# Patient Record
Sex: Female | Born: 1950
Health system: Southern US, Community
[De-identification: ages and names within clinical notes are randomized; demographics above are authoritative.]

## PROBLEM LIST (undated history)

## (undated) DIAGNOSIS — Z8719 Personal history of other diseases of the digestive system: Secondary | ICD-10-CM

## (undated) DIAGNOSIS — J189 Pneumonia, unspecified organism: Secondary | ICD-10-CM

## (undated) DIAGNOSIS — Q211 Atrial septal defect: Secondary | ICD-10-CM

## (undated) DIAGNOSIS — E119 Type 2 diabetes mellitus without complications: Secondary | ICD-10-CM

## (undated) DIAGNOSIS — C01 Malignant neoplasm of base of tongue: Secondary | ICD-10-CM

## (undated) DIAGNOSIS — Z923 Personal history of irradiation: Secondary | ICD-10-CM

## (undated) DIAGNOSIS — F32A Depression, unspecified: Secondary | ICD-10-CM

## (undated) DIAGNOSIS — I2510 Atherosclerotic heart disease of native coronary artery without angina pectoris: Secondary | ICD-10-CM

## (undated) DIAGNOSIS — J439 Emphysema, unspecified: Secondary | ICD-10-CM

## (undated) DIAGNOSIS — I219 Acute myocardial infarction, unspecified: Secondary | ICD-10-CM

## (undated) DIAGNOSIS — K219 Gastro-esophageal reflux disease without esophagitis: Secondary | ICD-10-CM

## (undated) DIAGNOSIS — Z86718 Personal history of other venous thrombosis and embolism: Secondary | ICD-10-CM

## (undated) DIAGNOSIS — M1990 Unspecified osteoarthritis, unspecified site: Secondary | ICD-10-CM

## (undated) DIAGNOSIS — Z9861 Coronary angioplasty status: Secondary | ICD-10-CM

## (undated) DIAGNOSIS — J42 Unspecified chronic bronchitis: Secondary | ICD-10-CM

## (undated) DIAGNOSIS — E7800 Pure hypercholesterolemia, unspecified: Secondary | ICD-10-CM

## (undated) DIAGNOSIS — I639 Cerebral infarction, unspecified: Secondary | ICD-10-CM

## (undated) DIAGNOSIS — Z9289 Personal history of other medical treatment: Secondary | ICD-10-CM

## (undated) DIAGNOSIS — G459 Transient cerebral ischemic attack, unspecified: Secondary | ICD-10-CM

## (undated) DIAGNOSIS — F329 Major depressive disorder, single episode, unspecified: Secondary | ICD-10-CM

## (undated) DIAGNOSIS — I739 Peripheral vascular disease, unspecified: Secondary | ICD-10-CM

## (undated) DIAGNOSIS — I6529 Occlusion and stenosis of unspecified carotid artery: Secondary | ICD-10-CM

## (undated) HISTORY — DX: Malignant neoplasm of base of tongue: C01

## (undated) HISTORY — PX: CORONARY ANGIOPLASTY WITH STENT PLACEMENT: SHX49

## (undated) HISTORY — DX: Atrial septal defect: Q21.1

## (undated) HISTORY — DX: Peripheral vascular disease, unspecified: I73.9

## (undated) HISTORY — DX: Personal history of other venous thrombosis and embolism: Z86.718

## (undated) HISTORY — PX: CARDIAC CATHETERIZATION: SHX172

## (undated) HISTORY — DX: Occlusion and stenosis of unspecified carotid artery: I65.29

## (undated) HISTORY — PX: FEMORAL BYPASS: SHX50

## (undated) HISTORY — DX: Atherosclerotic heart disease of native coronary artery without angina pectoris: I25.10

## (undated) HISTORY — DX: Emphysema, unspecified: J43.9

## (undated) HISTORY — DX: Acute myocardial infarction, unspecified: I21.9

## (undated) HISTORY — DX: Transient cerebral ischemic attack, unspecified: G45.9

## (undated) HISTORY — PX: CARPAL TUNNEL RELEASE: SHX101

## (undated) HISTORY — DX: Cerebral infarction, unspecified: I63.9

## (undated) HISTORY — DX: Atherosclerotic heart disease of native coronary artery without angina pectoris: Z98.61

## (undated) HISTORY — DX: Personal history of irradiation: Z92.3

## (undated) HISTORY — PX: EYE SURGERY: SHX253

---
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
## 1986-06-03 HISTORY — PX: VAGINAL HYSTERECTOMY: SUR661

## 1986-06-03 HISTORY — PX: TUBAL LIGATION: SHX77

## 1998-02-01 ENCOUNTER — Inpatient Hospital Stay (HOSPITAL_COMMUNITY): Admission: AD | Admit: 1998-02-01 | Discharge: 1998-02-03 | Payer: Self-pay | Admitting: Cardiology

## 1998-02-01 DIAGNOSIS — I25119 Atherosclerotic heart disease of native coronary artery with unspecified angina pectoris: Secondary | ICD-10-CM | POA: Insufficient documentation

## 1998-05-16 ENCOUNTER — Ambulatory Visit: Admission: RE | Admit: 1998-05-16 | Discharge: 1998-05-16 | Payer: Self-pay | Admitting: Cardiology

## 1998-06-20 ENCOUNTER — Inpatient Hospital Stay: Admission: RE | Admit: 1998-06-20 | Discharge: 1998-06-23 | Payer: Self-pay | Admitting: Vascular Surgery

## 1998-08-04 ENCOUNTER — Ambulatory Visit (HOSPITAL_BASED_OUTPATIENT_CLINIC_OR_DEPARTMENT_OTHER): Admission: RE | Admit: 1998-08-04 | Discharge: 1998-08-04 | Payer: Self-pay | Admitting: *Deleted

## 1998-08-20 ENCOUNTER — Inpatient Hospital Stay (HOSPITAL_COMMUNITY): Admission: EM | Admit: 1998-08-20 | Discharge: 1998-08-29 | Payer: Self-pay

## 1998-08-20 ENCOUNTER — Encounter: Payer: Self-pay | Admitting: Emergency Medicine

## 1998-08-21 ENCOUNTER — Encounter (HOSPITAL_BASED_OUTPATIENT_CLINIC_OR_DEPARTMENT_OTHER): Payer: Self-pay | Admitting: Internal Medicine

## 1998-08-24 ENCOUNTER — Encounter (HOSPITAL_BASED_OUTPATIENT_CLINIC_OR_DEPARTMENT_OTHER): Payer: Self-pay | Admitting: Internal Medicine

## 1998-09-04 ENCOUNTER — Encounter: Admission: RE | Admit: 1998-09-04 | Discharge: 1998-12-03 | Payer: Self-pay | Admitting: Internal Medicine

## 1998-09-20 ENCOUNTER — Encounter: Payer: Self-pay | Admitting: Emergency Medicine

## 1998-09-20 ENCOUNTER — Emergency Department (HOSPITAL_COMMUNITY): Admission: EM | Admit: 1998-09-20 | Discharge: 1998-09-20 | Payer: Self-pay | Admitting: Emergency Medicine

## 1998-09-20 ENCOUNTER — Ambulatory Visit (HOSPITAL_COMMUNITY): Admission: RE | Admit: 1998-09-20 | Discharge: 1998-09-20 | Payer: Self-pay | Admitting: Endocrinology

## 1998-12-29 ENCOUNTER — Encounter: Payer: Self-pay | Admitting: Endocrinology

## 1998-12-29 ENCOUNTER — Ambulatory Visit (HOSPITAL_COMMUNITY): Admission: RE | Admit: 1998-12-29 | Discharge: 1998-12-29 | Payer: Self-pay | Admitting: Endocrinology

## 1999-01-18 ENCOUNTER — Encounter: Payer: Self-pay | Admitting: *Deleted

## 1999-01-18 ENCOUNTER — Emergency Department (HOSPITAL_COMMUNITY): Admission: EM | Admit: 1999-01-18 | Discharge: 1999-01-18 | Payer: Self-pay | Admitting: Emergency Medicine

## 1999-08-22 ENCOUNTER — Encounter (INDEPENDENT_AMBULATORY_CARE_PROVIDER_SITE_OTHER): Payer: Self-pay | Admitting: Specialist

## 1999-08-22 ENCOUNTER — Ambulatory Visit (HOSPITAL_COMMUNITY): Admission: RE | Admit: 1999-08-22 | Discharge: 1999-08-22 | Payer: Self-pay | Admitting: *Deleted

## 1999-11-19 ENCOUNTER — Inpatient Hospital Stay (HOSPITAL_COMMUNITY): Admission: EM | Admit: 1999-11-19 | Discharge: 1999-11-21 | Payer: Self-pay | Admitting: Emergency Medicine

## 1999-12-20 ENCOUNTER — Encounter: Payer: Self-pay | Admitting: Obstetrics and Gynecology

## 1999-12-20 ENCOUNTER — Encounter: Admission: RE | Admit: 1999-12-20 | Discharge: 1999-12-20 | Payer: Self-pay | Admitting: Obstetrics and Gynecology

## 2000-05-11 ENCOUNTER — Emergency Department (HOSPITAL_COMMUNITY): Admission: EM | Admit: 2000-05-11 | Discharge: 2000-05-12 | Payer: Self-pay | Admitting: Emergency Medicine

## 2000-05-12 ENCOUNTER — Encounter: Payer: Self-pay | Admitting: Emergency Medicine

## 2000-07-03 ENCOUNTER — Encounter: Payer: Self-pay | Admitting: Emergency Medicine

## 2000-07-03 ENCOUNTER — Inpatient Hospital Stay (HOSPITAL_COMMUNITY): Admission: EM | Admit: 2000-07-03 | Discharge: 2000-07-04 | Payer: Self-pay | Admitting: Emergency Medicine

## 2001-07-22 ENCOUNTER — Encounter (INDEPENDENT_AMBULATORY_CARE_PROVIDER_SITE_OTHER): Payer: Self-pay | Admitting: Specialist

## 2001-07-22 ENCOUNTER — Ambulatory Visit (HOSPITAL_COMMUNITY): Admission: RE | Admit: 2001-07-22 | Discharge: 2001-07-22 | Payer: Self-pay | Admitting: *Deleted

## 2001-10-20 ENCOUNTER — Inpatient Hospital Stay (HOSPITAL_COMMUNITY): Admission: EM | Admit: 2001-10-20 | Discharge: 2001-10-23 | Payer: Self-pay | Admitting: Emergency Medicine

## 2001-10-20 ENCOUNTER — Encounter: Payer: Self-pay | Admitting: Emergency Medicine

## 2001-10-28 ENCOUNTER — Inpatient Hospital Stay (HOSPITAL_COMMUNITY): Admission: EM | Admit: 2001-10-28 | Discharge: 2001-10-29 | Payer: Self-pay | Admitting: *Deleted

## 2001-10-28 ENCOUNTER — Encounter: Payer: Self-pay | Admitting: *Deleted

## 2004-03-22 ENCOUNTER — Ambulatory Visit (HOSPITAL_COMMUNITY): Admission: RE | Admit: 2004-03-22 | Discharge: 2004-03-22 | Payer: Self-pay | Admitting: *Deleted

## 2004-03-22 ENCOUNTER — Encounter (INDEPENDENT_AMBULATORY_CARE_PROVIDER_SITE_OTHER): Payer: Self-pay | Admitting: Specialist

## 2005-04-29 ENCOUNTER — Encounter: Admission: RE | Admit: 2005-04-29 | Discharge: 2005-07-28 | Payer: Self-pay | Admitting: Orthopedic Surgery

## 2005-06-03 HISTORY — PX: ANKLE FRACTURE SURGERY: SHX122

## 2005-10-19 ENCOUNTER — Emergency Department (HOSPITAL_COMMUNITY): Admission: EM | Admit: 2005-10-19 | Discharge: 2005-10-19 | Payer: Self-pay | Admitting: *Deleted

## 2005-10-21 ENCOUNTER — Inpatient Hospital Stay (HOSPITAL_COMMUNITY): Admission: AD | Admit: 2005-10-21 | Discharge: 2005-10-24 | Payer: Self-pay | Admitting: Orthopedic Surgery

## 2007-03-16 ENCOUNTER — Emergency Department (HOSPITAL_COMMUNITY): Admission: EM | Admit: 2007-03-16 | Discharge: 2007-03-16 | Payer: Self-pay | Admitting: Emergency Medicine

## 2007-04-20 ENCOUNTER — Encounter (INDEPENDENT_AMBULATORY_CARE_PROVIDER_SITE_OTHER): Payer: Self-pay | Admitting: *Deleted

## 2007-04-20 ENCOUNTER — Ambulatory Visit (HOSPITAL_COMMUNITY): Admission: RE | Admit: 2007-04-20 | Discharge: 2007-04-20 | Payer: Self-pay | Admitting: *Deleted

## 2007-06-19 ENCOUNTER — Ambulatory Visit: Payer: Self-pay | Admitting: Vascular Surgery

## 2007-08-18 ENCOUNTER — Encounter: Admission: RE | Admit: 2007-08-18 | Discharge: 2007-08-18 | Payer: Self-pay | Admitting: Orthopedic Surgery

## 2008-06-03 DIAGNOSIS — C01 Malignant neoplasm of base of tongue: Secondary | ICD-10-CM

## 2008-06-03 HISTORY — DX: Malignant neoplasm of base of tongue: C01

## 2008-11-01 ENCOUNTER — Encounter: Admission: RE | Admit: 2008-11-01 | Discharge: 2008-11-01 | Payer: Self-pay | Admitting: Otolaryngology

## 2008-11-04 ENCOUNTER — Other Ambulatory Visit: Admission: RE | Admit: 2008-11-04 | Discharge: 2008-11-04 | Payer: Self-pay | Admitting: Otolaryngology

## 2008-11-10 ENCOUNTER — Other Ambulatory Visit: Admission: RE | Admit: 2008-11-10 | Discharge: 2008-11-10 | Payer: Self-pay | Admitting: Otolaryngology

## 2008-12-07 ENCOUNTER — Ambulatory Visit (HOSPITAL_COMMUNITY): Admission: RE | Admit: 2008-12-07 | Discharge: 2008-12-07 | Payer: Self-pay | Admitting: Otolaryngology

## 2008-12-07 ENCOUNTER — Encounter (INDEPENDENT_AMBULATORY_CARE_PROVIDER_SITE_OTHER): Payer: Self-pay | Admitting: Otolaryngology

## 2008-12-16 ENCOUNTER — Encounter: Admission: RE | Admit: 2008-12-16 | Discharge: 2008-12-16 | Payer: Self-pay | Admitting: Family Medicine

## 2008-12-19 ENCOUNTER — Ambulatory Visit (HOSPITAL_COMMUNITY): Admission: RE | Admit: 2008-12-19 | Discharge: 2008-12-19 | Payer: Self-pay | Admitting: Otolaryngology

## 2008-12-21 ENCOUNTER — Ambulatory Visit: Admission: RE | Admit: 2008-12-21 | Discharge: 2009-03-10 | Payer: Self-pay | Admitting: Radiation Oncology

## 2008-12-21 ENCOUNTER — Ambulatory Visit: Payer: Self-pay | Admitting: Oncology

## 2008-12-22 LAB — CBC WITH DIFFERENTIAL/PLATELET
Eosinophils Absolute: 0.1 10*3/uL (ref 0.0–0.5)
HCT: 41.3 % (ref 34.8–46.6)
LYMPH%: 23 % (ref 14.0–49.7)
MCV: 90 fL (ref 79.5–101.0)
MONO#: 0.4 10*3/uL (ref 0.1–0.9)
MONO%: 6 % (ref 0.0–14.0)
NEUT#: 4.7 10*3/uL (ref 1.5–6.5)
NEUT%: 68.9 % (ref 38.4–76.8)
Platelets: 195 10*3/uL (ref 145–400)
WBC: 6.8 10*3/uL (ref 3.9–10.3)

## 2008-12-22 LAB — COMPREHENSIVE METABOLIC PANEL
BUN: 11 mg/dL (ref 6–23)
CO2: 28 mEq/L (ref 19–32)
Creatinine, Ser: 0.84 mg/dL (ref 0.40–1.20)
Glucose, Bld: 172 mg/dL — ABNORMAL HIGH (ref 70–99)
Total Bilirubin: 0.5 mg/dL (ref 0.3–1.2)

## 2009-01-02 ENCOUNTER — Ambulatory Visit: Payer: Self-pay | Admitting: Dentistry

## 2009-01-02 ENCOUNTER — Encounter: Admission: RE | Admit: 2009-01-02 | Discharge: 2009-01-02 | Payer: Self-pay | Admitting: Dentistry

## 2009-01-04 ENCOUNTER — Ambulatory Visit (HOSPITAL_COMMUNITY): Admission: RE | Admit: 2009-01-04 | Discharge: 2009-01-04 | Payer: Self-pay | Admitting: Radiation Oncology

## 2009-01-16 LAB — CBC WITH DIFFERENTIAL/PLATELET
BASO%: 0.2 % (ref 0.0–2.0)
Basophils Absolute: 0 10*3/uL (ref 0.0–0.1)
EOS%: 5.3 % (ref 0.0–7.0)
HCT: 43.6 % (ref 34.8–46.6)
MCH: 30 pg (ref 25.1–34.0)
MCHC: 33.9 g/dL (ref 31.5–36.0)
MCV: 88.3 fL (ref 79.5–101.0)
MONO%: 6.3 % (ref 0.0–14.0)
NEUT%: 71.9 % (ref 38.4–76.8)
RDW: 14.3 % (ref 11.2–14.5)
lymph#: 1.5 10*3/uL (ref 0.9–3.3)

## 2009-01-16 LAB — COMPREHENSIVE METABOLIC PANEL
ALT: 29 U/L (ref 0–35)
AST: 17 U/L (ref 0–37)
CO2: 25 mEq/L (ref 19–32)
Calcium: 9.7 mg/dL (ref 8.4–10.5)
Chloride: 98 mEq/L (ref 96–112)
Sodium: 136 mEq/L (ref 135–145)
Total Protein: 6.5 g/dL (ref 6.0–8.3)

## 2009-01-16 LAB — MAGNESIUM: Magnesium: 1.8 mg/dL (ref 1.5–2.5)

## 2009-01-19 ENCOUNTER — Ambulatory Visit: Payer: Self-pay | Admitting: Oncology

## 2009-01-23 ENCOUNTER — Ambulatory Visit: Payer: Self-pay | Admitting: Oncology

## 2009-01-23 ENCOUNTER — Other Ambulatory Visit: Payer: Self-pay | Admitting: Oncology

## 2009-01-23 ENCOUNTER — Inpatient Hospital Stay (HOSPITAL_COMMUNITY): Admission: AD | Admit: 2009-01-23 | Discharge: 2009-01-27 | Payer: Self-pay | Admitting: Oncology

## 2009-01-23 LAB — CBC WITH DIFFERENTIAL/PLATELET
BASO%: 0.5 % (ref 0.0–2.0)
Basophils Absolute: 0 10*3/uL (ref 0.0–0.1)
EOS%: 12.7 % — ABNORMAL HIGH (ref 0.0–7.0)
HGB: 14.9 g/dL (ref 11.6–15.9)
MCH: 29.8 pg (ref 25.1–34.0)
MCV: 87.2 fL (ref 79.5–101.0)
MONO%: 5.8 % (ref 0.0–14.0)
RBC: 5 10*6/uL (ref 3.70–5.45)
RDW: 14.3 % (ref 11.2–14.5)
lymph#: 1.2 10*3/uL (ref 0.9–3.3)

## 2009-01-23 LAB — COMPREHENSIVE METABOLIC PANEL
ALT: 22 U/L (ref 0–35)
AST: 15 U/L (ref 0–37)
Albumin: 3.9 g/dL (ref 3.5–5.2)
Alkaline Phosphatase: 170 U/L — ABNORMAL HIGH (ref 39–117)
Calcium: 9.6 mg/dL (ref 8.4–10.5)
Chloride: 96 mEq/L (ref 96–112)
Potassium: 4.6 mEq/L (ref 3.5–5.3)
Sodium: 133 mEq/L — ABNORMAL LOW (ref 135–145)
Total Protein: 6.3 g/dL (ref 6.0–8.3)

## 2009-01-30 LAB — CBC WITH DIFFERENTIAL/PLATELET
Basophils Absolute: 0 10*3/uL (ref 0.0–0.1)
Eosinophils Absolute: 1.2 10*3/uL — ABNORMAL HIGH (ref 0.0–0.5)
HGB: 14.2 g/dL (ref 11.6–15.9)
LYMPH%: 11.4 % — ABNORMAL LOW (ref 14.0–49.7)
MONO#: 0.6 10*3/uL (ref 0.1–0.9)
NEUT#: 4.6 10*3/uL (ref 1.5–6.5)
Platelets: 161 10*3/uL (ref 145–400)
RBC: 4.74 10*6/uL (ref 3.70–5.45)
RDW: 14.5 % (ref 11.2–14.5)
WBC: 7.2 10*3/uL (ref 3.9–10.3)

## 2009-01-30 LAB — COMPREHENSIVE METABOLIC PANEL
AST: 16 U/L (ref 0–37)
Albumin: 4.3 g/dL (ref 3.5–5.2)
Alkaline Phosphatase: 165 U/L — ABNORMAL HIGH (ref 39–117)
BUN: 17 mg/dL (ref 6–23)
Creatinine, Ser: 0.71 mg/dL (ref 0.40–1.20)
Glucose, Bld: 243 mg/dL — ABNORMAL HIGH (ref 70–99)
Potassium: 4.6 mEq/L (ref 3.5–5.3)

## 2009-02-07 LAB — COMPREHENSIVE METABOLIC PANEL
Albumin: 3.9 g/dL (ref 3.5–5.2)
CO2: 23 mEq/L (ref 19–32)
Calcium: 9.5 mg/dL (ref 8.4–10.5)
Chloride: 96 mEq/L (ref 96–112)
Glucose, Bld: 350 mg/dL — ABNORMAL HIGH (ref 70–99)
Sodium: 133 mEq/L — ABNORMAL LOW (ref 135–145)
Total Bilirubin: 0.5 mg/dL (ref 0.3–1.2)
Total Protein: 6.7 g/dL (ref 6.0–8.3)

## 2009-02-07 LAB — CBC WITH DIFFERENTIAL/PLATELET
Eosinophils Absolute: 0.3 10*3/uL (ref 0.0–0.5)
HCT: 42.6 % (ref 34.8–46.6)
LYMPH%: 10.8 % — ABNORMAL LOW (ref 14.0–49.7)
MONO#: 0.5 10*3/uL (ref 0.1–0.9)
NEUT#: 4.2 10*3/uL (ref 1.5–6.5)
Platelets: 152 10*3/uL (ref 145–400)
RBC: 4.88 10*6/uL (ref 3.70–5.45)
WBC: 5.7 10*3/uL (ref 3.9–10.3)

## 2009-02-07 LAB — MAGNESIUM: Magnesium: 1.9 mg/dL (ref 1.5–2.5)

## 2009-02-20 ENCOUNTER — Ambulatory Visit: Payer: Self-pay | Admitting: Oncology

## 2009-02-20 LAB — CBC WITH DIFFERENTIAL/PLATELET
BASO%: 0.7 % (ref 0.0–2.0)
EOS%: 2.7 % (ref 0.0–7.0)
HCT: 39.9 % (ref 34.8–46.6)
MCHC: 32.8 g/dL (ref 31.5–36.0)
MONO#: 0.4 10*3/uL (ref 0.1–0.9)
RBC: 4.42 10*6/uL (ref 3.70–5.45)
RDW: 15 % — ABNORMAL HIGH (ref 11.2–14.5)
WBC: 4.5 10*3/uL (ref 3.9–10.3)
lymph#: 0.4 10*3/uL — ABNORMAL LOW (ref 0.9–3.3)

## 2009-03-01 ENCOUNTER — Ambulatory Visit (HOSPITAL_COMMUNITY): Admission: RE | Admit: 2009-03-01 | Discharge: 2009-03-01 | Payer: Self-pay | Admitting: Radiation Oncology

## 2009-03-06 LAB — CBC WITH DIFFERENTIAL/PLATELET
Basophils Absolute: 0 10*3/uL (ref 0.0–0.1)
EOS%: 1.5 % (ref 0.0–7.0)
HGB: 13.9 g/dL (ref 11.6–15.9)
MCH: 30.6 pg (ref 25.1–34.0)
MCHC: 34.7 g/dL (ref 31.5–36.0)
MCV: 88.4 fL (ref 79.5–101.0)
MONO%: 8.6 % (ref 0.0–14.0)
RDW: 14.9 % — ABNORMAL HIGH (ref 11.2–14.5)

## 2009-03-06 LAB — COMPREHENSIVE METABOLIC PANEL
AST: 18 U/L (ref 0–37)
Alkaline Phosphatase: 128 U/L — ABNORMAL HIGH (ref 39–117)
BUN: 21 mg/dL (ref 6–23)
Creatinine, Ser: 0.76 mg/dL (ref 0.40–1.20)
Potassium: 5 mEq/L (ref 3.5–5.3)

## 2009-03-15 ENCOUNTER — Ambulatory Visit (HOSPITAL_COMMUNITY): Admission: RE | Admit: 2009-03-15 | Discharge: 2009-03-15 | Payer: Self-pay | Admitting: Oncology

## 2009-03-17 ENCOUNTER — Ambulatory Visit (HOSPITAL_COMMUNITY): Admission: RE | Admit: 2009-03-17 | Discharge: 2009-03-17 | Payer: Self-pay | Admitting: Oncology

## 2009-03-17 LAB — CBC WITH DIFFERENTIAL/PLATELET
Basophils Absolute: 0 10*3/uL (ref 0.0–0.1)
Eosinophils Absolute: 0.1 10*3/uL (ref 0.0–0.5)
HGB: 13.7 g/dL (ref 11.6–15.9)
LYMPH%: 8 % — ABNORMAL LOW (ref 14.0–49.7)
MCV: 88.8 fL (ref 79.5–101.0)
MONO%: 7.5 % (ref 0.0–14.0)
NEUT#: 2.7 10*3/uL (ref 1.5–6.5)
NEUT%: 79.8 % — ABNORMAL HIGH (ref 38.4–76.8)
Platelets: 219 10*3/uL (ref 145–400)

## 2009-03-17 LAB — COMPREHENSIVE METABOLIC PANEL
Albumin: 3.8 g/dL (ref 3.5–5.2)
Alkaline Phosphatase: 182 U/L — ABNORMAL HIGH (ref 39–117)
BUN: 16 mg/dL (ref 6–23)
Glucose, Bld: 342 mg/dL — ABNORMAL HIGH (ref 70–99)
Total Bilirubin: 0.6 mg/dL (ref 0.3–1.2)

## 2009-03-20 ENCOUNTER — Ambulatory Visit (HOSPITAL_COMMUNITY): Admission: RE | Admit: 2009-03-20 | Discharge: 2009-03-20 | Payer: Self-pay | Admitting: Oncology

## 2009-04-03 ENCOUNTER — Ambulatory Visit (HOSPITAL_COMMUNITY): Admission: RE | Admit: 2009-04-03 | Discharge: 2009-04-03 | Payer: Self-pay | Admitting: Oncology

## 2009-04-03 ENCOUNTER — Ambulatory Visit: Payer: Self-pay | Admitting: Oncology

## 2009-04-05 LAB — COMPREHENSIVE METABOLIC PANEL
ALT: 17 U/L (ref 0–35)
AST: 18 U/L (ref 0–37)
Albumin: 4.1 g/dL (ref 3.5–5.2)
Calcium: 9.3 mg/dL (ref 8.4–10.5)
Chloride: 103 mEq/L (ref 96–112)
Creatinine, Ser: 0.79 mg/dL (ref 0.40–1.20)
Potassium: 4.4 mEq/L (ref 3.5–5.3)
Sodium: 140 mEq/L (ref 135–145)
Total Protein: 6.7 g/dL (ref 6.0–8.3)

## 2009-04-05 LAB — CBC WITH DIFFERENTIAL/PLATELET
BASO%: 0.6 % (ref 0.0–2.0)
EOS%: 3.8 % (ref 0.0–7.0)
MCH: 29.9 pg (ref 25.1–34.0)
MCHC: 33.5 g/dL (ref 31.5–36.0)
MONO#: 0.3 10*3/uL (ref 0.1–0.9)
RBC: 4.62 10*6/uL (ref 3.70–5.45)
RDW: 15.4 % — ABNORMAL HIGH (ref 11.2–14.5)
WBC: 3 10*3/uL — ABNORMAL LOW (ref 3.9–10.3)
lymph#: 0.3 10*3/uL — ABNORMAL LOW (ref 0.9–3.3)

## 2009-06-03 HISTORY — PX: CATARACT EXTRACTION W/ INTRAOCULAR LENS IMPLANT: SHX1309

## 2009-06-05 ENCOUNTER — Ambulatory Visit: Payer: Self-pay | Admitting: Oncology

## 2009-06-06 ENCOUNTER — Ambulatory Visit (HOSPITAL_COMMUNITY): Admission: RE | Admit: 2009-06-06 | Discharge: 2009-06-06 | Payer: Self-pay | Admitting: Oncology

## 2009-06-08 LAB — COMPREHENSIVE METABOLIC PANEL
AST: 12 U/L (ref 0–37)
Albumin: 3.9 g/dL (ref 3.5–5.2)
Alkaline Phosphatase: 111 U/L (ref 39–117)
Chloride: 101 mEq/L (ref 96–112)
Sodium: 136 mEq/L (ref 135–145)
Total Bilirubin: 0.6 mg/dL (ref 0.3–1.2)
Total Protein: 6.3 g/dL (ref 6.0–8.3)

## 2009-06-08 LAB — CBC WITH DIFFERENTIAL/PLATELET
EOS%: 1.9 % (ref 0.0–7.0)
Eosinophils Absolute: 0.1 10*3/uL (ref 0.0–0.5)
HGB: 14.3 g/dL (ref 11.6–15.9)
LYMPH%: 12.1 % — ABNORMAL LOW (ref 14.0–49.7)
MCH: 29.9 pg (ref 25.1–34.0)
MCHC: 34 g/dL (ref 31.5–36.0)
MONO#: 0.3 10*3/uL (ref 0.1–0.9)
NEUT#: 3.1 10*3/uL (ref 1.5–6.5)
NEUT%: 77.7 % — ABNORMAL HIGH (ref 38.4–76.8)
RBC: 4.79 10*6/uL (ref 3.70–5.45)
RDW: 14.7 % — ABNORMAL HIGH (ref 11.2–14.5)
WBC: 3.9 10*3/uL (ref 3.9–10.3)

## 2009-06-29 ENCOUNTER — Ambulatory Visit (HOSPITAL_COMMUNITY): Admission: RE | Admit: 2009-06-29 | Discharge: 2009-06-29 | Payer: Self-pay | Admitting: Oncology

## 2009-09-04 ENCOUNTER — Ambulatory Visit: Payer: Self-pay | Admitting: Oncology

## 2009-09-06 LAB — CBC WITH DIFFERENTIAL/PLATELET
Eosinophils Absolute: 0.1 10*3/uL (ref 0.0–0.5)
MONO#: 0.3 10*3/uL (ref 0.1–0.9)
MONO%: 5.6 % (ref 0.0–14.0)
NEUT%: 84.8 % — ABNORMAL HIGH (ref 38.4–76.8)
Platelets: 144 10*3/uL — ABNORMAL LOW (ref 145–400)
RBC: 4.45 10*6/uL (ref 3.70–5.45)
RDW: 17.5 % — ABNORMAL HIGH (ref 11.2–14.5)
WBC: 5.2 10*3/uL (ref 3.9–10.3)
lymph#: 0.4 10*3/uL — ABNORMAL LOW (ref 0.9–3.3)

## 2009-09-06 LAB — COMPREHENSIVE METABOLIC PANEL
ALT: 10 U/L (ref 0–35)
Alkaline Phosphatase: 84 U/L (ref 39–117)
BUN: 15 mg/dL (ref 6–23)
Calcium: 8.7 mg/dL (ref 8.4–10.5)
Chloride: 104 mEq/L (ref 96–112)
Creatinine, Ser: 0.81 mg/dL (ref 0.40–1.20)
Total Bilirubin: 0.5 mg/dL (ref 0.3–1.2)

## 2009-09-29 ENCOUNTER — Ambulatory Visit (HOSPITAL_COMMUNITY): Admission: RE | Admit: 2009-09-29 | Discharge: 2009-09-29 | Payer: Self-pay | Admitting: Gastroenterology

## 2009-10-11 ENCOUNTER — Ambulatory Visit (HOSPITAL_COMMUNITY): Admission: RE | Admit: 2009-10-11 | Discharge: 2009-10-11 | Payer: Self-pay | Admitting: Oncology

## 2009-12-25 ENCOUNTER — Ambulatory Visit: Payer: Self-pay | Admitting: Oncology

## 2009-12-27 LAB — COMPREHENSIVE METABOLIC PANEL
ALT: 12 U/L (ref 0–35)
AST: 13 U/L (ref 0–37)
Albumin: 4.3 g/dL (ref 3.5–5.2)
BUN: 10 mg/dL (ref 6–23)
Calcium: 9.4 mg/dL (ref 8.4–10.5)
Chloride: 102 mEq/L (ref 96–112)

## 2009-12-27 LAB — CBC WITH DIFFERENTIAL/PLATELET
Basophils Absolute: 0 10*3/uL (ref 0.0–0.1)
HGB: 15 g/dL (ref 11.6–15.9)
LYMPH%: 10.4 % — ABNORMAL LOW (ref 14.0–49.7)
MCH: 31.8 pg (ref 25.1–34.0)
MONO%: 7.1 % (ref 0.0–14.0)
NEUT%: 80 % — ABNORMAL HIGH (ref 38.4–76.8)
Platelets: 162 10*3/uL (ref 145–400)
RBC: 4.7 10*6/uL (ref 3.70–5.45)
RDW: 14.8 % — ABNORMAL HIGH (ref 11.2–14.5)
lymph#: 0.4 10*3/uL — ABNORMAL LOW (ref 0.9–3.3)

## 2010-04-02 ENCOUNTER — Ambulatory Visit: Payer: Self-pay | Admitting: Oncology

## 2010-04-04 ENCOUNTER — Ambulatory Visit (HOSPITAL_COMMUNITY): Admission: RE | Admit: 2010-04-04 | Discharge: 2010-04-04 | Payer: Self-pay | Admitting: Oncology

## 2010-04-04 LAB — CBC WITH DIFFERENTIAL/PLATELET
BASO%: 0.8 % (ref 0.0–2.0)
Basophils Absolute: 0 10*3/uL (ref 0.0–0.1)
EOS%: 2.1 % (ref 0.0–7.0)
Eosinophils Absolute: 0.1 10*3/uL (ref 0.0–0.5)
LYMPH%: 13.2 % — ABNORMAL LOW (ref 14.0–49.7)
MCH: 30.5 pg (ref 25.1–34.0)
MONO#: 0.3 10*3/uL (ref 0.1–0.9)
MONO%: 6.5 % (ref 0.0–14.0)
NEUT%: 77.4 % — ABNORMAL HIGH (ref 38.4–76.8)
RBC: 4.92 10*6/uL (ref 3.70–5.45)
WBC: 4.1 10*3/uL (ref 3.9–10.3)
lymph#: 0.5 10*3/uL — ABNORMAL LOW (ref 0.9–3.3)

## 2010-04-04 LAB — COMPREHENSIVE METABOLIC PANEL
ALT: 18 U/L (ref 0–35)
Albumin: 3.8 g/dL (ref 3.5–5.2)
Alkaline Phosphatase: 112 U/L (ref 39–117)
BUN: 10 mg/dL (ref 6–23)
CO2: 30 mEq/L (ref 19–32)
Calcium: 9.4 mg/dL (ref 8.4–10.5)
Total Bilirubin: 0.8 mg/dL (ref 0.3–1.2)
Total Protein: 6.6 g/dL (ref 6.0–8.3)

## 2010-06-25 ENCOUNTER — Encounter: Payer: Self-pay | Admitting: Family Medicine

## 2010-07-13 ENCOUNTER — Ambulatory Visit
Admission: RE | Admit: 2010-07-13 | Discharge: 2010-07-13 | Disposition: A | Discharge status: Home / Self Care | Payer: MEDICARE | Source: Ambulatory Visit | Attending: Endocrinology | Admitting: Endocrinology

## 2010-07-13 ENCOUNTER — Other Ambulatory Visit: Payer: Self-pay | Admitting: Endocrinology

## 2010-07-13 DIAGNOSIS — S0990XA Unspecified injury of head, initial encounter: Secondary | ICD-10-CM

## 2010-07-13 DIAGNOSIS — R42 Dizziness and giddiness: Secondary | ICD-10-CM

## 2010-07-13 DIAGNOSIS — G44009 Cluster headache syndrome, unspecified, not intractable: Secondary | ICD-10-CM

## 2010-07-16 ENCOUNTER — Other Ambulatory Visit: Payer: Self-pay | Admitting: Endocrinology

## 2010-07-16 DIAGNOSIS — R110 Nausea: Secondary | ICD-10-CM

## 2010-07-18 ENCOUNTER — Ambulatory Visit
Admission: RE | Admit: 2010-07-18 | Discharge: 2010-07-18 | Disposition: A | Discharge status: Home / Self Care | Payer: MEDICARE | Source: Ambulatory Visit | Attending: Endocrinology | Admitting: Endocrinology

## 2010-07-18 DIAGNOSIS — R110 Nausea: Secondary | ICD-10-CM

## 2010-08-21 LAB — GLUCOSE, CAPILLARY: Glucose-Capillary: 113 mg/dL — ABNORMAL HIGH (ref 70–99)

## 2010-08-24 ENCOUNTER — Ambulatory Visit (HOSPITAL_COMMUNITY)
Admission: RE | Admit: 2010-08-24 | Discharge: 2010-08-24 | Disposition: A | Discharge status: Home / Self Care | Payer: MEDICARE | Source: Ambulatory Visit | Attending: Gastroenterology | Admitting: Gastroenterology

## 2010-08-24 DIAGNOSIS — R109 Unspecified abdominal pain: Secondary | ICD-10-CM | POA: Insufficient documentation

## 2010-08-24 DIAGNOSIS — R634 Abnormal weight loss: Secondary | ICD-10-CM | POA: Insufficient documentation

## 2010-08-24 DIAGNOSIS — Z7902 Long term (current) use of antithrombotics/antiplatelets: Secondary | ICD-10-CM | POA: Insufficient documentation

## 2010-08-24 DIAGNOSIS — Z79899 Other long term (current) drug therapy: Secondary | ICD-10-CM | POA: Insufficient documentation

## 2010-08-24 DIAGNOSIS — Z800 Family history of malignant neoplasm of digestive organs: Secondary | ICD-10-CM | POA: Insufficient documentation

## 2010-08-29 ENCOUNTER — Other Ambulatory Visit: Payer: Self-pay | Admitting: Oncology

## 2010-08-29 ENCOUNTER — Encounter (HOSPITAL_BASED_OUTPATIENT_CLINIC_OR_DEPARTMENT_OTHER): Payer: MEDICARE | Admitting: Oncology

## 2010-08-29 DIAGNOSIS — Z1231 Encounter for screening mammogram for malignant neoplasm of breast: Secondary | ICD-10-CM

## 2010-08-29 DIAGNOSIS — Z85810 Personal history of malignant neoplasm of tongue: Secondary | ICD-10-CM

## 2010-08-29 DIAGNOSIS — E58 Dietary calcium deficiency: Secondary | ICD-10-CM

## 2010-08-29 DIAGNOSIS — R5383 Other fatigue: Secondary | ICD-10-CM

## 2010-08-29 DIAGNOSIS — C01 Malignant neoplasm of base of tongue: Secondary | ICD-10-CM

## 2010-08-29 LAB — COMPREHENSIVE METABOLIC PANEL
ALT: 14 U/L (ref 0–35)
Alkaline Phosphatase: 92 U/L (ref 39–117)
CO2: 26 mEq/L (ref 19–32)
Creatinine, Ser: 0.89 mg/dL (ref 0.40–1.20)
Sodium: 137 mEq/L (ref 135–145)
Total Bilirubin: 0.5 mg/dL (ref 0.3–1.2)
Total Protein: 6.4 g/dL (ref 6.0–8.3)

## 2010-08-29 LAB — CBC WITH DIFFERENTIAL/PLATELET
BASO%: 1 % (ref 0.0–2.0)
EOS%: 4.1 % (ref 0.0–7.0)
HCT: 42.2 % (ref 34.8–46.6)
LYMPH%: 17.2 % (ref 14.0–49.7)
MCH: 30.3 pg (ref 25.1–34.0)
MCHC: 33.4 g/dL (ref 31.5–36.0)
MCV: 90.6 fL (ref 79.5–101.0)
MONO#: 0.2 10*3/uL (ref 0.1–0.9)
MONO%: 7.8 % (ref 0.0–14.0)
NEUT%: 69.9 % (ref 38.4–76.8)
Platelets: 137 10*3/uL — ABNORMAL LOW (ref 145–400)
RBC: 4.66 10*6/uL (ref 3.70–5.45)
WBC: 3 10*3/uL — ABNORMAL LOW (ref 3.9–10.3)

## 2010-08-29 LAB — TSH: TSH: 2.139 u[IU]/mL (ref 0.350–4.500)

## 2010-09-04 ENCOUNTER — Encounter (HOSPITAL_COMMUNITY): Payer: Self-pay

## 2010-09-04 ENCOUNTER — Ambulatory Visit (HOSPITAL_COMMUNITY)
Admission: RE | Admit: 2010-09-04 | Discharge: 2010-09-04 | Disposition: A | Discharge status: Home / Self Care | Payer: MEDICARE | Source: Ambulatory Visit | Attending: Oncology | Admitting: Oncology

## 2010-09-04 DIAGNOSIS — J449 Chronic obstructive pulmonary disease, unspecified: Secondary | ICD-10-CM | POA: Insufficient documentation

## 2010-09-04 DIAGNOSIS — J4489 Other specified chronic obstructive pulmonary disease: Secondary | ICD-10-CM | POA: Insufficient documentation

## 2010-09-04 DIAGNOSIS — Z85810 Personal history of malignant neoplasm of tongue: Secondary | ICD-10-CM

## 2010-09-04 DIAGNOSIS — Z09 Encounter for follow-up examination after completed treatment for conditions other than malignant neoplasm: Secondary | ICD-10-CM | POA: Insufficient documentation

## 2010-09-04 DIAGNOSIS — C01 Malignant neoplasm of base of tongue: Secondary | ICD-10-CM | POA: Insufficient documentation

## 2010-09-04 MED ORDER — IOHEXOL 300 MG/ML  SOLN
100.0000 mL | Freq: Once | INTRAMUSCULAR | Status: AC | PRN
  Administered 2010-09-04: 100 mL via INTRAVENOUS

## 2010-09-08 LAB — CULTURE, BLOOD (ROUTINE X 2)
Culture: NO GROWTH
Culture: NO GROWTH

## 2010-09-08 LAB — BASIC METABOLIC PANEL
BUN: 10 mg/dL (ref 6–23)
BUN: 12 mg/dL (ref 6–23)
BUN: 9 mg/dL (ref 6–23)
CO2: 28 mEq/L (ref 19–32)
Calcium: 9.2 mg/dL (ref 8.4–10.5)
Chloride: 105 mEq/L (ref 96–112)
Creatinine, Ser: 0.73 mg/dL (ref 0.4–1.2)
Creatinine, Ser: 0.73 mg/dL (ref 0.4–1.2)
GFR calc Af Amer: >60 mL/min (ref >60)
GFR calc non Af Amer: >60 mL/min (ref >60)
GFR calc non Af Amer: >60 mL/min (ref >60)
Glucose, Bld: 197 mg/dL — ABNORMAL HIGH (ref 70–99)
Potassium: 4.1 mEq/L (ref 3.5–5.1)
Potassium: 4.2 mEq/L (ref 3.5–5.1)
Sodium: 137 mEq/L (ref 135–145)

## 2010-09-08 LAB — CBC
Hemoglobin: 13.1 g/dL (ref 12.0–15.0)
MCHC: 34 g/dL (ref 30.0–36.0)
MCHC: 34 g/dL (ref 30.0–36.0)
MCV: 89.5 fL (ref 78.0–100.0)
MCV: 89.9 fL (ref 78.0–100.0)
Platelets: 166 10*3/uL (ref 150–400)
Platelets: 177 10*3/uL (ref 150–400)
RDW: 14.3 % (ref 11.5–15.5)
RDW: 14.5 % (ref 11.5–15.5)
RDW: 14.6 % (ref 11.5–15.5)
WBC: 7.3 10*3/uL (ref 4.0–10.5)

## 2010-09-08 LAB — GLUCOSE, CAPILLARY
Glucose-Capillary: 165 mg/dL — ABNORMAL HIGH (ref 70–99)
Glucose-Capillary: 166 mg/dL — ABNORMAL HIGH (ref 70–99)
Glucose-Capillary: 166 mg/dL — ABNORMAL HIGH (ref 70–99)
Glucose-Capillary: 201 mg/dL — ABNORMAL HIGH (ref 70–99)
Glucose-Capillary: 228 mg/dL — ABNORMAL HIGH (ref 70–99)
Glucose-Capillary: 244 mg/dL — ABNORMAL HIGH (ref 70–99)
Glucose-Capillary: 244 mg/dL — ABNORMAL HIGH (ref 70–99)
Glucose-Capillary: 250 mg/dL — ABNORMAL HIGH (ref 70–99)

## 2010-09-08 LAB — WOUND CULTURE

## 2010-09-08 LAB — MAGNESIUM: Magnesium: 1.7 mg/dL (ref 1.5–2.5)

## 2010-09-08 LAB — BODY FLUID CULTURE

## 2010-09-08 LAB — FUNGUS CULTURE W SMEAR

## 2010-09-09 LAB — BASIC METABOLIC PANEL
CO2: 30 mEq/L (ref 19–32)
Calcium: 9.7 mg/dL (ref 8.4–10.5)
GFR calc Af Amer: >60 mL/min (ref >60)
Glucose, Bld: 221 mg/dL — ABNORMAL HIGH (ref 70–99)
Potassium: 5 mEq/L (ref 3.5–5.1)
Sodium: 140 mEq/L (ref 135–145)

## 2010-09-09 LAB — CBC
HCT: 42.8 % (ref 36.0–46.0)
Hemoglobin: 14.8 g/dL (ref 12.0–15.0)
MCHC: 34.6 g/dL (ref 30.0–36.0)
RBC: 4.74 MIL/uL (ref 3.87–5.11)
RDW: 14.8 % (ref 11.5–15.5)

## 2010-09-09 LAB — GLUCOSE, CAPILLARY

## 2010-09-12 ENCOUNTER — Ambulatory Visit: Payer: MEDICARE | Attending: Radiation Oncology | Admitting: Radiation Oncology

## 2010-09-18 ENCOUNTER — Ambulatory Visit
Admission: RE | Admit: 2010-09-18 | Discharge: 2010-09-18 | Disposition: A | Discharge status: Home / Self Care | Payer: MEDICARE | Source: Ambulatory Visit | Attending: Oncology | Admitting: Oncology

## 2010-09-18 DIAGNOSIS — E58 Dietary calcium deficiency: Secondary | ICD-10-CM

## 2010-09-18 DIAGNOSIS — Z1231 Encounter for screening mammogram for malignant neoplasm of breast: Secondary | ICD-10-CM

## 2010-10-16 NOTE — Procedures (Signed)
BYPASS GRAFT EVALUATION   INDICATION:  Bilateral lower extremity bypass graft, right calf pain,  mild left leg pain.   HISTORY:  Diabetes:  Orally controlled.  Cardiac:  MI in 2003 with follow-up five coronary stents.  Hypertension:  No.  Smoking:  A pack per day for over 35 years.  Previous Surgery:  Right fem-pop bypass graft with translocated  nonreversed saphenous vein on 06/14/97.  Left fem-pop bypass graft with  translocated nonreversed saphenous vein on 06/20/98.  Both of these  surgeries were performed by Dr. Arbie Cookey.   SINGLE LEVEL ARTERIAL EXAM                               RIGHT              LEFT  Brachial:                    136                138  Anterior tibial:             110                128  Posterior tibial:            110                126  Peroneal:  Ankle/brachial index:        0.80               0.93   PREVIOUS ABI:  Date: 06/07/05  RIGHT:  0.92  LEFT:  1.0   LOWER EXTREMITY BYPASS GRAFT DUPLEX EXAM:   DUPLEX:  Doppler arterial waveforms are triphasic to biphasic proximal  to, within, and distal to the bypass grafts bilaterally.   IMPRESSION:  1. Ankle brachial indices are slightly lower than previously recorded      bilaterally.  2. Patent femoropopliteal bypass grafts bilaterally.   ___________________________________________  Larina Earthly, M.D.   MC/MEDQ  D:  06/19/2007  T:  06/19/2007  Job:  [id number]

## 2010-10-16 NOTE — H&P (Signed)
Brenda [name], Brenda [name]               ACCOUNT NO.:  [id number]   MEDICAL RECORD NO.:  [id number]          PATIENT TYPE:  INP   LOCATION:  1309                         FACILITY:  [facility]   PHYSICIAN:  Jethro Bolus, MD            DATE OF BIRTH:  May 01, 1951   DATE OF ADMISSION:  01/23/2009  DATE OF DISCHARGE:                              HISTORY & PHYSICAL   REASON FOR ADMISSION:  PEG tube site cellulitis and abscess.   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  She  started concurrent Erbitux and radiation therapy a week ago.  She had a  PEG tube placement prophylactically due to weight loss prior to therapy  and concern for potential mucositis  A week ago, she presented to the  clinic with purulent discharge and erythema around the PEG tube site.  She was given a course of Keflex 500 grams p.o. q. 6 h., with some  improvement of her purulent discharge, however, with not much  improvement of the erythema.  This past week she has been having  increasing pain at the site of the PEG tube without fever.  She has been  taking Percocet without complete resolution of the PEG tube site pain.  She complains of mild fatigue, not worse than prior to starting Erbitux  therapy.  She has increasing mouth sores despite taking salt and soda  mouth rinses and has therefore decreased weight of less than half a  pound.  She inputs Ensure approximately 3 cans a day via her PEG tube.  She has some mild skin rash on her nasal bridge without any frank  aciniform skin rash.  She denies diarrhea or constipation.  She denies  headache, visual changes, nausea or vomiting.  She has baseline mild  shortness of breath from her COPD and emphysema, however, she does not  require chronic home O2 or inhalers.  She denies nausea, vomiting, chest  pain, swelling, jaundice, melena, hematochezia, hematuria, vaginal  bleeding, lower  extremity skin rash, lower extremity swelling or calf  pain, focal motor weakness or focal bone pain.   The rest of 14-point review of systems were negative.   PAST MEDICAL HISTORY:  1. 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  2. Diabetes mellitus type 2 for the past 15 years.  She denied a      history of nephropathy, neuropathy or retinopathy.  3. Hypertension.  4. Chronic obstructive pulmonary disease.  5. Peripheral vascular disease requiring bilateral bypass surgery.  6. Gastroesophageal reflux disease.  7. History of cerebrovascular accident in 2000, with mild residual      left-sided weakness and occasional imbalance without any syncope.  8. History of coronary disease with MI in 2005, requiring stenting.      She denies history of CHF.  9. History of colonic polyps, status post colonoscopy, last time      April 20, 2007, by  Dr. Virginia Rochester.  10.History of DVT when she was 60 old in the left lower extremity that      was unprovoked.   SOCIAL HISTORY:  The patient used to smoke a pack a day.  She has  decreased down to half a pack a day and for the past 2 weeks has been  off of smoking using Chantix.  She denies alcohol or IV drug use.  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   FAMILY HISTORY:  Mother deceased from pancreatic cancer.  Father  deceased from throat cancer.  She has two brothers, one of whom has  coronary disease.  She has two sisters, one with breast cancer and  another one with diabetes.  She has multiple family members with aunts,  uncles and cousins with brain, lung cancer, throat cancer and pancreatic  cancer.  Two maternal aunts with breast cancer.   CURRENT OUTPATIENT MEDICATIONS:  1. Aspirin 81 mg p.o. daily.  2. Calcium 600 plus vitamin D 1000 international units b.i.d.  3. Keflex 500 mg p.o. q.6 h.  4. Plavix 75 mg p.o. daily.  5. Fenofibrate 135 mg p.o.  daily.  6. Fish oil 1000 mg p.o. daily.  7. Glipizide 10 mg p.o. q.a.m.  8. Ativan 0.5 mg p.o. q.6 h., p.r.n. nausea and vomiting.  9. Magic mouth wash p.r.n.  10.Metformin 1000 mg p.o. b.i.d.  11.Metoprolol tartrate 25 mg p.o. b.i.d.  12.Omeprazole 40 mg p.o. nightly.  13.Compazine 10 mg p.o. q.6 h., nausea and vomiting.  14.Lovastatin 20 mg p.o. daily.  15.Tiotropium 18 mcg inhalers p.r.n.  16.Chantix unknown dose BID.   ALLERGIES:  NO KNOWN DRUG ALLERGIES.   PHYSICAL EXAMINATION:  VITAL SIGNS:  Temperature was 97.60F, heart rate  was 65, respiratory rate was 20, blood pressure was 130/69.  ECOG  performance status was 1.  Weight was 193.8 pounds.  GENERAL:  Mildly obese woman in no acute distress.  HEENT:  There was no scleral icterus.  There was mild hyper-erythematous  lesions in the bilateral soft palate junction without whitish exudative  discharge.  LYMPH NODE:  Her lymph node survey in the right cervical level II, level  III measured 2 x 2 cm with tenderness to palpation.  LUNGS:  Clear bilaterally without wheezing.  CARDIAC:  Regular rate and rhythm.  S1-S2 without murmur, rub or gallop.  ABDOMEN:  Soft, flat, nontender, nondistended without organomegaly.  PEG  tube in place with an area 2 x 2 cm area of erythema with purulent  discharge and foul smelling.  There was no pedal edema.  NEURO:  Nonfocal.   LABORATORY DATA:  WBC 8.7, ANC 5.9, hemoglobin 14.9, platelet count 198.  Sodium 133, potassium 4.6, chloride 96, bicarb 26, glucose 341, BUN 17,  creatinine 0.75, total protein 0.5, alk phos 170, AST 15, ALT 22, total  protein 6.3, albumin 3.9, calcium 9.6, magnesium 1.7.   ASSESSMENT/PLAN:  1. Percutaneous endoscopic gastrostomy tube site cellulitis and      abscess:  She was evaluated by interventional radiology today and      was thought to have purulent discharge from the percutaneous      endoscopic gastrostomy tube insertion site.  They recommended       continue feeding her by mouth and hold off on feeding through the      percutaneous endoscopic gastrostomy tube.  However, since the  percutaneous endoscopic gastrostomy tube was recently placed, it is      not possible to surgically remove secondary due to lack of tract      formation.  Given her multiple morbid conditions, I started the      patient on antibiotics with vancomycin and Zosyn for broad coverage      of MRSA and gram neg and anaerobes.  Blood culture and percutaneous      endoscopic gastrostomy tube site cultures are pending to narrow      antibiotic coverage prior to discharge.  She will also receive some      IV fluids just in case she develops sepsis with normal saline 150      mL per hour for 2 liters total.   1. Locally advanced head and neck cancer:  She will undergo daily      radiation therapy per radiation oncology.  She received her dose of      weekly Erbitux today.  She has not had any side effects of Erbitux,      except for a grade 1 aciniform skin rash.   1. Aciniform skin rash:  We encouraged her to use Vaseline to moisten      her face in addition to Cleocin T gel 1% b.i.d.  In the future if      she develops worsening skin rash despite moisturization and Cleocin      T, then was I will prescribed hydrocortisone 1% cream to the facial      rash.   1. Diabetes mellitus.  We will continue with metformin and glipizide      in addition to insulin sliding scale.   1. Hypertension:  well controlled on metoprolol 25 mg p.o. b.i.d.      outpatient regimen.   1. Gastroesophageal reflux disease.  Continue with omeprazole 40 mg      p.o. daily.   1. Hypercholesteremia.  Continue with simvastatin 20 mg p.o. daily.   1. Chronic obstructive pulmonary disease.  P.r.n. albuterol NEB.   1. History of coronary artery disease and cerebrovascular accident.      We will continue with aspirin 81 mg p.o. daily and Plavix 75 mg      p.o. daily.   1. Prophylaxis.   Lovenox 40 mg subcu daily as in the past.  She had a      history of deep venous thrombosis in the distant past.   The patient is full code.      Jethro Bolus, MD  Electronically Signed     HH/MEDQ  D:  01/23/2009  T:  01/23/2009  Job:  [id number]

## 2010-10-16 NOTE — Op Note (Signed)
Brenda [name], Brenda [name]               ACCOUNT NO.:  [id number]   MEDICAL RECORD NO.:  [id number]          PATIENT TYPE:  AMB   LOCATION:  SDS                          FACILITY:  [facility]   PHYSICIAN:  Antony Contras, MD     DATE OF BIRTH:  05/08/1951   DATE OF PROCEDURE:  12/07/2008  DATE OF DISCHARGE:  12/07/2008                               OPERATIVE REPORT   PREOPERATIVE DIAGNOSES:  1. Right cervical node.  2. Squamous cell carcinoma.   POSTOPERATIVE DIAGNOSES:  1. Right cervical node.  2. Squamous cell carcinoma.   PROCEDURES:  1. Direct laryngoscopy with biopsy.  2. Esophagoscopy.   SURGEON:  Excell Seltzer. Jenne Pane, MD   ANESTHESIA:  General endotracheal anesthesia.   COMPLICATIONS:  None.   INDICATIONS:  The patient is a 61 year old white female who has had a  firm mass in the right neck for several months.  She has no other  symptoms and fiberoptic exam was fairly unremarkable.  A needle biopsy  of the node demonstrated findings concerning for squamous cell  carcinoma.  Thus, she presents to the operating room for surgical  biopsy.   FINDINGS:  There was no evidence of mucosal lesion in the oral cavity,  larynx, or hypopharynx.  The patient had absent tonsils.  The tongue  base was firm and nodular, but fairly symmetric.  A CT scan has  suggested right tongue base mass.  Biopsies were taken from the left,  central, and right tongue bases, as well as the right vallecula.   DESCRIPTION OF PROCEDURE:  The patient was identified in the holding  room and informed consent having been obtained including discussion of  risks, benefits, and alternatives.  The patient was brought to the  operative suite and put on the operative table in supine position.  Anesthesia was induced.  The patient was intubated by the anesthesia  team without difficulty.  The patient was given intravenous antibiotics  during the case.  The eyes was taped closed and bed was turned 90  degrees from  anesthesia.  Being edentulous, a damp gauze was placed over  the upper gum.  An anterior commissure laryngoscope was then used to  evaluate the various areas of the oral cavity, oropharynx, larynx, and  hypopharynx.  Findings are noted above.  This was then removed and a  rigid esophagoscope was then passed down the esophagus keeping the lumen  in view down about two-thirds of the esophagus.  It was then backed out  slowly examining the esophagus at 360 degrees.  The anterior commissure  laryngoscope was reinserted and biopsies were taken from the left,  right, and central tongue base, and right vallecula  using upbiting cup forceps.  These were passed to nursing for pathology.  The throat was then suctioned and the laryngoscope removed.  She was  then returned back to Anesthesia for wake up and was extubated and moved  to the recovery room in stable condition.      Antony Contras, MD  Electronically Signed     DDB/MEDQ  D:  12/07/2008  T:  12/08/2008  Job:  [id number]

## 2010-10-16 NOTE — Op Note (Signed)
Brenda [name], MCCOSH               ACCOUNT NO.:  [id number]   MEDICAL RECORD NO.:  [id number]          PATIENT TYPE:  AMB   LOCATION:  ENDO                         [facility]ACILITY:  [facility]   PHYSICIAN:  Georgiana Spinner, M.D.    DATE OF BIRTH:  [birth year]   DATE OF PROCEDURE:  04/20/2007  DATE OF DISCHARGE:                               OPERATIVE REPORT   PROCEDURE:  Upper endoscopy.   INDICATIONS:  GERD.   ANESTHESIA:  Demerol 50,Versed 5 mg.   PROCEDURE:  With the patient mildly sedated in the left lateral  decubitus position the Pentax videoscopic endoscope was inserted and  passed under direct vision through the esophagus which appeared normal  except for some mild changes at the squamocolumnar junction that were  photographed and biopsied.  We entered into the stomach.  The fundus,  body appeared normal other than an ulcer between the antrum and body of  which was photographed and biopsied.  It had irregular stellate shape  mild redness around it but smooth borders and probably benign, but  biopsies were taken.  The endoscope was advanced through duodenal bulb,  second portion duodenum both appeared normal.  From this point the  endoscope was slowly withdrawn taking circumferential views of duodenal  mucosa until the endoscope had been pulled back into the stomach placed  in retroflexion to view the stomach from below.  The endoscope was  straightened and withdrawn taking circumferential views remaining  gastric and esophageal mucosa.  The patient's vital signs, pulse  oximeter remained stable.  The patient tolerated procedure well without  apparent complications.   FINDINGS:  Mild erythema of distal esophagus biopsied at the  squamocolumnar junction, ulcer of the body of the stomach biopsied.  Await biopsy reports.  The patient will call me for results and follow-  up with me as an outpatient.  Proceed to colonoscopy as planned.           ______________________________  Georgiana Spinner, M.D.     GMO/MEDQ  D:  04/20/2007  T:  04/20/2007  Job:  [id number]

## 2010-10-16 NOTE — Discharge Summary (Signed)
Brenda [name], Brenda [name]               ACCOUNT NO.:  [id number]   MEDICAL RECORD NO.:  [id number]          PATIENT TYPE:  INP   LOCATION:  1309                         FACILITY:  [facility]   PHYSICIAN:  Jethro Bolus, MD            DATE OF BIRTH:  22-May-1951   DATE OF ADMISSION:  01/23/2009  DATE OF DISCHARGE:                               DISCHARGE SUMMARY   DISCHARGE DIAGNOSES:  Klebsiella pneumonia cellulitis and abscess of  percutaneous endoscopic gastrostomy tube   CONSULTATION:  Interventional radiology evaluation on January 23, 2009,  for PEG tube evaluation which was thought to have abscess  formation/cellulitis.  They recommended maintaining PEG tube without  using it and IV antibiotics as appropriate.   </HISTORY OF PRESENT ILLNESS/HOSPITAL COURSE>  1. 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  She is undergoing definitive Erbitux radiation therapy      started about 3 weeks ago.  She presented with 1-week of      erythematous and purulent discharge from PEG tube site without      improvement after 1-week of Keflex.  Therefore, with increasing      pain and purulence and erythema she was admitted to hospital on      January 23, 2009.  She was started on empiric antibiotics with      vancomycin and Zosyn given her major morbid conditions and lack of      improvement with antibiotic coverage with Keflex.  On admission      prior to antibiotics starting she had a wound culture and blood      culture obtained.  The dosing of vancomycin and Zosyn were assisted      by pharmacy service.  She had improvement of her PEG tube site      pain, erythema and purulent discharge two days into the course of      antibiotics.  On January 26, 2009, one of the wound cultures      resulted in Klebsiella pneumonia and the  following day with the      sensitivity to Zosyn and cephalosporins in addition ciprofloxacin.      Therefore, she was discharged home after 5 days of IV Zosyn in      addition to 5 more days of ciprofloxacin mg p.o. b.i.d.  Her blood      culture remained negative during this hospital course.  Her vital      signs were stable and she was afebrile the entire hospital course.  2. Squamous cell carcinoma of the base of the tongue:  She received      weekly dose of Erbitux on January 23, 2009, and daily radiation      therapy while she was in the hospital.  3. Mucositis:  Her mucositis was mild and she was advised to continue  with Vicodin elixir 7.5/500/15 ml every 6 h., p.r.n.  She was also      prescribed Magic Mouthwash p.r.n.  4. Diabetes mellitus type 2:  She has not followed her CBG up closely      nor her primary care physician.  During hospital course, her CBG      ran in the 200 range.  Therefore, her antihyperglycemic regimens      was added on with  Actos 50 mg p.o. daily in addition to metformin      1000 mg p.o. b.i.d. and glyburide increased from 10 mg p.o. daily      to 10 mg p.o. b.i.d.  5. History of CAD and CVA: She was on Plavix, aspirin and metoprolol.      Unclear why she was not on an ACE inhibitor by her primary      physician, which will need to be considered on an outpatient basis.  6. History of cerebrovascular accident:  She was on aspirin and Plavix      and control of her hypertension.  7. Hypertension:  Her pressure was well-controlled on metoprolol      dosing of 25 mg p.o. b.i.d.  8. Calorie protein malnutrition:  Due to the PEG tube infections, she      was advised not to use the PEG tube.  Therefore, she was depending      on oral intake entirely for her nutrition.  She took mechanical      soft food and was evaluated by dietician, Clarita Crane and was      recommended to add on Ensure Plus three times daily with meals.  9. Hyperlipidemia:  She was  continued on rosuvastatin.  10.History of smoking cessation:  She is more than 3 weeks from      cessation and was continued on Chantix 1 mg p.o. b.i.d.   DISCHARGE CONDITION:  Improved.   DISCHARGE ACTIVITY:  No restriction.   DISCHARGE DIET:  Mechanical soft with Ensure Plus three times daily, 1  can with meal each time.   DISCHARGE FOLLOWUP:  Dr. Gaylyn Rong in [facility] on January 30, 2009, at  11:30 prior to next dose of weekly Erbitux.   DISCHARGE MEDICATIONS:  1. Ciprofloxacin 5 mg p.o. b.i.d. for 5 days.  2. Actos 50 mg p.o. daily.  3. Hydeocortisone 1% cream to face once daily for facial skin rash.  4. Cleomycin T 1% b.i.d. to the patient's skin rash.  5. Aspirin 81 mg p.o. daily.  6. Calcium carbonate 500 mg p.o. b.i.d.  7. Cholecalciferol 1000 units b.i.d.  8. Plavix 75 mg p.o. daily.  9. Fenofibrate 135 mg p.o. daily.  10.Fish oil 1000 mg p.o. daily.  11.Glipizide 10 mg p.o. b.i.d.  12.Vicodin elixir 7.5/500/15 mm every 6 hours p.r.n. mouth sore.  13.Metformin 1000 mg p.o. b.i.d.  14.Omeprazole 40 mg p.o. daily.  15.Crestor or rosuvastatin 20 mg p.o. nightly.  16.Chantix 1 mg p.o. b.i.d.  17.Compazine 10 mg p.o. q.6 h., p.r.n. nausea, vomiting.  18.Zofran 8 mg p.o. q.12 h., p.r.n. nausea, vomiting.  19.Ativan 0.5 mg p.o. q.6 h., p.r.n. anticipatory nausea, vomiting.  20.Milk of Magnesia p.r.n. constipation.      Jethro Bolus, MD  Electronically Signed     HH/MEDQ  D:  01/27/2009  T:  01/27/2009  Job:  [id number]

## 2010-10-16 NOTE — Op Note (Signed)
NAMEMEHGAN, SANTMYER               ACCOUNT NO.:  [id number]   MEDICAL RECORD NO.:  [id number]          PATIENT TYPE:  AMB   LOCATION:  ENDO                         FACILITY:  [facility]   PHYSICIAN:  Georgiana Spinner, M.D.    DATE OF BIRTH:  November 07, 1950   DATE OF PROCEDURE:  DATE OF DISCHARGE:                               OPERATIVE REPORT   PROCEDURE:  Colonoscopy.   INDICATIONS:  Rectal bleeding, colon polyps.   ANESTHESIA:  Demerol 50 mg, Versed 5 mg.   PROCEDURE:  With the patient mildly sedated in the left lateral  decubitus position and subsequently rolled to her back, the Pentax  videoscopic colonoscope was inserted into the rectum and passed under  direct vision.  With pressure applied, we reached the cecum, identified  by crow's foot of cecum and ileocecal valve, both of which were  photographed.  From this point the colonoscope was slowly withdrawn,  taking circumferential views of the colonic mucosa, stopping at the  hepatic flexure where a polyp was seen, photographed, and removed using  snare cautery technique setting of 20/150 blended current.  Tissue was  retrieved for pathology by suctioning the polyp through a tissue trap.  The endoscope was then withdrawn, taking circumferential views of the  remaining colonic mucosa as we withdrew to the rectum which appeared  normal on direct and showed hemorrhoids on retroflexed view.  The  endoscope was straightened and withdrawn.  The patient's vital signs and  pulse oximeter remained stable.  The patient tolerated the procedure  well without apparent complications.   FINDINGS:  Polyp of hepatic flexure.  Internal hemorrhoids.  Otherwise  an unremarkable exam.   PLAN:  Await biopsy report.  The patient will call me for results and  follow up with me as an outpatient.           ______________________________  Georgiana Spinner, M.D.     GMO/MEDQ  D:  04/20/2007  T:  04/20/2007  Job:  [id number]

## 2010-10-19 NOTE — Op Note (Signed)
[place]. [facility]  Patient:    Brenda [name], Brenda [name] [facility] Visit Number: [id number] MRN: [id number]          Service Type: MED Location: [facility] 2921 [id number] Attending Physician:  Silvestre Mesi Dictated by:   Aram Candela. Aleen Campi, M.D. Proc. Date: 10/20/01 Admit Date:  10/28/2001 Discharge Date: 10/29/2001                             Operative Report  FINAL DIAGNOSES: 1. Unstable angina. 2. Coronary artery disease.  REASON FOR ADMISSION:  This 60 year old female was admitted on May 20 after several days of intermittent chest discomfort associated with weakness, sweating and she was seen in the emergency room during a prolonged episode of chest pain, and her chest pain was relieved with nitroglycerin.  This pain was similar to her prior angina in 2001 and 2002 when she had episodes of unstable angina and coronary stent placement.  She has a high-risk profile with diabetes and continues to smoke and has two prior episodes of unstable angina requiring coronary stenting on both occasions.  Her enzymes were normal on admission; however, with the severity of her symptoms and reliable history, it was felt that this represented an episode of unstable angina and was scheduled for cardiac catheterization.  HOSPITAL COURSE:  The patient had no further chest pain after her admission and at catheterization, she was found to have progression of disease in her right coronary artery and two stents were required to cover this segmental abnormality.  She had a very good appearance of the two prior stents in her circumflex and obtuse marginal branch, and she had moderate stenosis in her LAD.  Her left ventricular function remained normal.  She remained stable post catheterization and was considered stable for discharge home Oct 23, 2001, and was discharged in stable, improved condition.  DISCHARGE MEDICATIONS: 1. Enteric-coated aspirin one daily. 2. Plavix 75 mg q.d. 3.  Toprol 25 mg q.d. 4. Altace 2.5 mg q.d. 5. Glucotrol 10 mg b.i.d. 6. Pravachol 40 mg q.h.s. 7. Actos 15 mg q.d. 8. Restart Glucophage 1 g b.i.d. in two days.  DISCHARGE INSTRUCTIONS:  Activity:  No restrictions.  Diet:  Diabetic low-fat diet.  SPECIAL INSTRUCTIONS:  She was given instructions to join a smoking cessation support group and continuation in the smoking cessation counseling provided here at [facility].  FOLLOW-UP:  Appointment in two weeks after discharge with office visit.  CONDITION ON DISCHARGE:  Stable and improved. Dictated by:   Aram Candela. Aleen Campi, M.D. Attending Physician:  Silvestre Mesi DD:  11/06/01 TD:  11/09/01 Job: [id number]

## 2010-10-19 NOTE — H&P (Signed)
Brenda [name], Brenda [name]               ACCOUNT NO.:  [id number]   MEDICAL RECORD NO.:  [id number]          PATIENT TYPE:  INP   LOCATION:  5031                         FACILITY:  [facility]   PHYSICIAN:  John L. Rendall, M.D.  DATE OF BIRTH:  [birth year]   DATE OF ADMISSION:  10/21/2005  DATE OF DISCHARGE:                                HISTORY & PHYSICAL   CHIEF COMPLAINT:  Painful right ankle.   HISTORY:  This patient states that on Saturday she had fallen and injured  the right ankle.  She was brought to [facility] emergency room  where she was noted on x-ray to have a minimum of a bimalleolar fracture,  possibly a trimalleolar fracture.  This was minimally displaced.  She was  splinted and sent home with strict bed rest and elevation of the extremity.  She was called on Sunday to have surgery on Oct 21, 2005.  She apparently  was brought to [facility], and, because of what was told  to be a borderline stress test, they essentially refused to do it as an  outpatient procedure.  Dr. Tysinger, who is her cardiologist, was called  about this, and stated that she should be admitted to the hospital, and then  a decision as to which studies would be necessary.  She, unfortunately, has  had several stents, and has had 1 myocardial infarction in the past.  She is  admitted at this time for evaluation and possible ORIF of her right ankle.   PAST MEDICAL HISTORY:  General health is good.   SURGICAL HISTORY:  1.  2001 - cardiac catheterization and stents x2.  2.  2004 - she had cardiac catheterization with 2 stents, and then      apparently, after this within 2 weeks, she apparently had an MI, and      then another stent was placed.  3.  She has had a femoral popliteal on the right in 1999, and on the left in      20 00.  4.  A vaginal hysterectomy in 1988.  5.  Appendectomy in 1961.   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   MEDICATIONS:  1.  Metformin 1000 mg b.i.d.  2.  Glipizide 6 mg daily.  3.  Actos 30 mg daily.  4.  Plavix 75 mg daily.  5.  Toprol 25 mg daily.  6.  Crestor 40 mg daily.  7.  Aspirin 81 mg daily.  8.  Fish oil and Omega 3, 1000 mg b.i.d.  9.  Glucosamine b.i.d.   ALLERGIES:  None known.   REVIEW OF SYSTEMS:  CARDIOVASCULAR:  She has coronary artery disease and has  had a total of 5 stents placed.  She has had a myocardial infarction in  2004.  She also has had a femoral-popliteal bypass in 1999 on the right and  in 2000 on the left.  ENDOCRINE:  She does have a history of noninsulin-  dependent diabetes mellitus for 22 years.  She apparently does occasionally  use insulin when hospitalized.  GI:  She does have a history of  gastroesophageal reflux disease.  She had been using Nexium, but has been  taken off her insurance list.  Therefore, she is unable to afford this.  She  also has a history of hemorrhoids.  NEUROLOGIC:  She does have a history of  a CVA in 2000.  She has had some left-sided weakness.  Presently uses Plavix  and aspirin for this.  She is also hyperlipidemic.   FAMILY HISTORY:  Noncontributory.   SOCIAL HISTORY:  She is a 60 year old white married female, unemployed.  She  smokes one pack of cigarettes per day for 40 years.  She states an  occasional use of wine.   PHYSICAL EXAMINATION:  GENERAL:  A 60 year old white female, well-developed,  well-nourished, alert, cooperative, in mild distress secondary to right  ankle pain.  VITAL SIGNS:  Temperature of 98.9, pulse 78, respirations 20, blood pressure  126/62.  O2 saturation is 96% on room air.  HEENT:  Head is normocephalic.  Eyes - pupils equal, round and reactive to  light and accommodation.  Extraocular movements intact.  Ears, nose and  throat were benign.  NECK:  Supple.  CHEST:  Fair expansion.  LUNGS:  Markedly decreased breath sounds with an occasional scattered  wheeze.  CARDIAC:  Regular rhythm  and rate.  Normal S1 and S2.  Distant heart sounds.  No murmur was noted.  Pulses were not obtained in the right leg.  The left  leg had 1+ dorsalis pedis.  ABDOMEN:  Obese, soft, nontender.  No mass palpable.  Normal bowel sounds  present.  GENITAL/RECTAL/BREAST:  Not indicated for this hospitalization.  CNS:  She appears to be oriented x3.  Cranial nerves II-XII grossly intact.  MUSCULOSKELETAL:  The right ankle is encompassed in a splint at this time.  The anterior portion of the splint was opened, and I do not see any open  areas.  No signs of any necrosis or blistering.  She has good capillary  refill.  EHL and FHL are intact.  Sensation is intact.  Sensation is intact  to light touch.   X-RAYS:  A Weber C distal fibular fracture and a medial malleolar fracture,  which is minimally displaced.  There may be a posterior small fragment.   CLINICAL IMPRESSION:  1.  Bimalleolar/trimalleolar fracture of the right ankle.  2.  History of cerebrovascular accident.  3.  History of coronary artery disease with myocardial infarction.  4.  Atherosclerotic cardiovascular disease.  5.  Noninsulin-dependent diabetes mellitus.  6.  Hyperlipidemia.  7.  Gastroesophageal reflux disease.   RECOMMENDATIONS:  At this time, we are trying to obtain a cardiac clearance  by Dr. Aleen Campi.  Once we can get appropriate documented clearance, then we  will try to proceed with open reduction and internal fixation of the ankle  fracture.  The procedure, risks and benefits, will be explained to the  patient.      Oris Drone Petrarca, P.A.-C.      Carlisle Beers. Rendall, M.D.  Electronically Signed    BDP/MEDQ  D:  10/21/2005  T:  10/21/2005  Job:  [id number]

## 2010-10-19 NOTE — Discharge Summary (Signed)
[place]  Patient:    Brenda [name], Brenda [name] [facility]                 MRN: [id number] Adm. Date:  [id number] Disc. Date: [id number] Attending:  Silvestre Mesi Dictator:   Donzetta Matters, P.A.                           Discharge Summary  DATE OF BIRTH:  1951/03/24  PRINCIPAL DIAGNOSES ON DISCHARGE: 1. Coronary artery disease with a stent in her circumflex. 2. Diabetes mellitus, noninsulin dependent. 3. Tobacco abuse. 4. Peripheral vascular disease and cerebrovascular accident history.  PROCEDURE:  Left heart catheterization on November 20, 1999, by Dr. Aleen Campi.  CONSULTS:  Dr. Aleen Campi.  COMPLICATIONS:  None.  CONDITION ON DISCHARGE:  Stable and improved.  HOSPITAL COURSE:  This is a 60 year old female that was having chest pain on Monday while she was working.  She was then seen in the office and immediately admitted to the hospital overnight for rule-out evaluation and for heart catheterization.  She then underwent left heart catheterization which did show a new severe stenosis in the proximal circumflex with good long-term results in the mid circumflex stented area.  She has had mild progression of disease in the proximal LAD, distal LAD, and proximal RCA.  Now, she is at moderate stenoses.  She has a normal LIMA, normal left ventricular function.  She has moderate left renal artery stenosis and underwent successful PTCA and stenting of the proximal circumflex.  She then had successful Perclose post procedure. Overnight, she remained stable, blood pressure stable at 120/70, pulse rate 58.  Her blood sugar was 125 this morning.  She is doing well.  Chest is clear, heart regular rate and rhythm.  Right groin with no hematoma or bruising.  She was felt ready for discharge to home with multiple issues that did need to be addressed.  She is to be off Glucophage three days post catheterization.  This is to be checked closely with a glucometer.  She  does need a new prescription for this as well as test strips.  She does need to be seen in the office in two weeks.  She is also seen by staff at [facility] which did discuss the need for the proper type of glucometer that would be covered by her insurance as well as test strips.  Also, she is interested in trying Wellbutrin for stopping smoking and this will be taken care of.  It was felt that she was ready for discharge to home in stable condition.  LABORATORY DATA:  CBC on admission showed hemoglobin at 14.7, hematocrit 41.2, white count 7.4, platelet count 232.  These were rechecked twice and is stable with lab on June 19 showing white count at 5600, hemoglobin 12.9, hematocrit 36.4, and platelet count 167.  Pro time is 12.8 with INR of 1.0, PTT of 29. Chemistry on admission did show sodium at 138, potassium 3.8, chloride 107, CO2 25, BUN 10, creatinine 0.7, glucose 92, normal liver function tests.  Her cardiac enzymes on admission did show negative x 2 with negative troponin I. Chest x-ray results from the office.  EKG done on June 19 showed normal sinus rhythm, rate of 71, no specific acute changes noted.  DISCHARGE DISPOSITION:  Patient was ready for discharge to home on June 20. Her new medications include Plavix 75 mg one daily, sample of eight tablets was  given as well as prescription.  She is started on Toprol XL 25 mg daily, new prescription is written.  She is to hold the Aggrenox and will be on coated aspirin 325 mg daily.  She is to continue her other medicines which include Prevacid 30 mg daily, Pravachol 40 mg daily, Glucotrol 10 mg daily, Estrace 0.5 mg daily.  She is to hold the Glucophage XR 500 two tablets daily for three days, do not restart until June 3.  She has a work note to be off work until November 26, 1999.  Diet is to be low cholesterol, no sugar.  She is given a prescription for Accu-Chek, Advantage glucometer, to check her blood sugar daily.  She is  to watch the groin for any pain or bleeding.  She is advised to stop smoking and has prescription of Wellbutrin 150 mg q.d. x 3 days, then b.i.d. x 12 weeks.  She is to follow up with Dr. Aleen Campi in two weeks and call for an appointment.  She is also to schedule a duplex ultrasound for renal artery stenosis at that visit. DD:  11/21/99 TD:  11/22/99 Job: [id number]

## 2010-10-19 NOTE — Op Note (Signed)
Brenda [name], Brenda [name]               ACCOUNT NO.:  [id number]   MEDICAL RECORD NO.:  [id number]          PATIENT TYPE:  AMB   LOCATION:  ENDO                         FACILITY:  [facility]   PHYSICIAN:  Georgiana Spinner, M.D.    DATE OF BIRTH:  1950/07/07   DATE OF PROCEDURE:  03/22/2004  DATE OF DISCHARGE:                                 OPERATIVE REPORT   PROCEDURE:  Colonoscopy with biopsy.   INDICATIONS:  Colon polyps, rectal bleeding.   ANESTHESIA:  Demerol 100 mg, Versed 10 mg.   PROCEDURE:  With the patient mildly sedated in the left lateral decubitus  position, the Olympus videoscopic colonoscope was inserted into the rectum  and passed under direct vision to the cecum, identified by crow's foot of  the cecum and ileocecal valve, both of which were photographed.  From this  point the colonoscope was then slowly withdrawn, taking circumferential  views of the colonic mucosa, stopping in the descending colon, where a small  polyp was seen and removed using hot biopsy forceps technique, setting of  20/200 blended current, next in the sigmoid area, where a sigmoid polyp was  seen.  It too was photographed and it too was removed using the same hot  biopsy forceps technique, a setting of 20/200.  The endoscope was withdrawn  to the rectum, which showed some mild erythema that had been photographed  and was biopsied, and the endoscope was then placed in retroflexion to view  the anal canal from above, and internal hemorrhoids were seen.  The  endoscope was straightened and withdrawn.  The patient's vital signs and  pulse oximetry remained stable.  The patient tolerated the procedure well  without apparent complications.   FINDINGS:  Internal hemorrhoids, polyps of descending colon and sigmoid  colon, and erythema of rectum.   PLAN:  Await biopsy report.  The patient will call me for results and follow  up with me as an outpatient.       GMO/MEDQ  D:  03/22/2004  T:  03/22/2004  Job:   [id number]   cc:   S. Kyra Manges, M.D.  [phone] [address]  [place]  [place] [address]  Fax: [phone]

## 2010-10-19 NOTE — Discharge Summary (Signed)
Brenda [name], Brenda [name]               ACCOUNT NO.:  [id number]   MEDICAL RECORD NO.:  [id number]          PATIENT TYPE:  INP   LOCATION:  5031                         FACILITY:  [facility]   PHYSICIAN:  Dyke Brackett, M.D.    DATE OF BIRTH:  Oct 02, 1950   DATE OF ADMISSION:  10/21/2005  DATE OF DISCHARGE:  10/24/2005                                 DISCHARGE SUMMARY   ADMISSION DIAGNOSES:  Distal fibular fracture and medial malleolar fracture,  which is minimally displaced.   DISCHARGE DIAGNOSES:  1. Distal fibular fracture and medial malleolar fracture, which is      minimally displaced.  2. History of cerebrovascular accident.  3. History of coronary artery disease with myocardial infarction.  4. Atherosclerotic cardiovascular disease.  5. Non-insulin-dependent diabetes mellitus.  6. Hyperlipidemia.  7. Gastroesophageal reflux.  8. Hyperlipidemia.   HISTORY:  This patient, on Saturday, had fallen and injured her right ankle.  She was brought to the [facility] Emergency Room when she was noted on x-ray  to have a minimum of a bimalleolar fracture and possibly a trimalleolar  fracture.  This was minimally displaced.  She was splinted and sent home on  strict bed rest and brought to [facility] because of what  was told to be a borderline stress test.  They essentially refused to do as  an outpatient.  Dr. Aleen Campi who is her cardiologist was called about this  and stated that she should be admitted to the hospital and then a decision  as to which studies would be necessary.  She unfortunately has had several  stents and has had a myocardial infarction in the past.  Admitted at this  time for evaluation of possible ORIF.   HOSPITAL COURSE:  A 60 year old female admitted on Oct 21, 2005.  She is  placed at bed rest with elevation of the leg.  Dr. Aleen Campi had seen the  patient, and it was felt that a cardiac cath was indicated.  She was taken  to the cardiac cath lab on  the 22nd where she underwent a cardiac cath.  At  that time, it was felt that she was cleared for surgery.  She was then made  n.p.o. after midnight, and once laboratory studies were noted, she was taken  to the operating room on the Oct 23, 2005 where she underwent an ORIF of her  right ankle.  She tolerated the procedure well.  She was placed on morphine  2-4 mg IV q.1-2h. for pain.  Lovenox 40 mg subcu q.24h. was started on May  24th at 8:00 in the morning.  Ancef 1 gm IV q.8h. x3 doses was continued  postoperatively.  Consultation with PT was obtained for touchdown  weightbearing on the right.  Right leg was elevated.  Remainder of hospital  course was uneventful, and she was discharged on the 24th to return back to  the office in 10-12 days for a recheck evaluation.   EKG on May 21st revealed normal sinus rhythm versus ectopic atrial rhythm.  Chest x-ray on 05/21 revealed no acute abnormalities.  Chest x-ray on May  21st revealed bibasilar atelectasis, right greater than left.   LABORATORY STUDIES:  Admitted with hemoglobin of 12.7, hematocrit 36.9%,  white count 6,000, platelets 180,000.  Discharge hemoglobin 11.3, hematocrit  33.3%, white count 6,800, platelets 163,000.  Pro time was 13.1, INR 1.0 and  PTT 33 upon admission.  Electrolytes with sodium 136, potassium 3.9,  chloride 103, CO2 27, glucose 105, BUN 17, creatinine 0.9, calcium 8.8,  total protein 6.3, albumin 3.4, AST 15, ALT 16, ALP 92 and total bilirubin  was 0.7.  Discharge sodium 135, potassium 4.3, chloride 101, CO2 29, glucose  220, BUN 14, creatinine 0.9, calcium 8.6.  Glycosylated hemoglobin was 8.3.   DISCHARGE INSTRUCTIONS:  No restrictions in her diet though she should  return to her carb-modified diet.  She will increase her activities slowly  and use her crutches for walking, touch-down weightbearing on the right.  Elevate the right foot above the heart when in bed.  May resume preop meds  except for no  Plavix while on Lovenox.  Lovenox 40 mg inject every morning  for 10 days as instructed.  Percocet 5/325 1-2 tabs every 4 hours as needed  for pain.  She will follow back up with Dr. Madelon Lips in 10-12 days for any  therapy needs.  She was discharged in good condition.      Oris Drone Santiago Bumpers, P.A.-C.      Dyke Brackett, M.D.  Electronically Signed    BDP/MEDQ  D:  12/16/2005  T:  12/17/2005  Job:  [id number]

## 2010-10-19 NOTE — Procedures (Signed)
[facility]  Patient:    Brenda [name], Brenda [name] Visit Number: [id number] MRN: [id number]          Service Type: Attending:  Sabino Gasser, M.D. Dictated by:   Sabino Gasser, M.D. Proc. Date: 07/22/01                             Procedure Report  PROCEDURE:  Colonoscopy with polypectomy and biopsy.  INDICATIONS:  Colon polyps.  ANESTHESIA:  Demerol 80 mg, Versed 8 mg.  DESCRIPTION OF PROCEDURE:  With the patient mildly sedated in the left lateral decubitus position, the Olympus videoscopic colonoscope was inserted in the rectum and passed under direct vision into the cecum, identified by the ileocecal valve and appendiceal orifice, both of which were photographed. From this point, the colonoscope was slowly withdrawn taking circumferential views of the entire colonic mucosa, stopping first at the ileocecal valve region where there was a polyp seen just adjacent to the valve, which was flat and removed using the hot biopsy forceps technique, setting of 20/20 blended current. There was a second polyp just somewhat distal to this by about two folds and it was removed using snare cautery technique, setting of 20/20 blended current once again. The endoscope was then withdrawn all the way to the rectum, which appeared normal except for one area of redness which we noted on insertion of the colonoscope, and this may have been a nonspecific erythema, but since it was inflamed, we biopsied it. The endoscope was placed in retroflexion to view the anal canal from above. Internal hemorrhoids were seen and photographed. The endoscope was straightened and withdrawn. The patients vital signs and pulse oximeter remained stable. The patient tolerated the procedure well without apparent complications.  FINDINGS:  Mild redness of the most distal part of the rectum along with internal hemorrhoids were seen. Two polyps were seen, one adjacent to the ileocecal valve and one two  folds removed. Both were placed in the same bottle.  PLAN:  Await biopsy report. The patient will call Dr. Virginia Rochester for results and follow up with Dr. Virginia Rochester as an outpatient. Dictated by:   Sabino Gasser, M.D. Attending:  Sabino Gasser, M.D. DD:  07/22/01 TD:  07/22/01 Job: [id number]

## 2010-10-19 NOTE — Cardiovascular Report (Signed)
[name]. [facility]  Patient:    Brenda [name], Brenda [name] [facility]                 MRN: [id number] Proc. Date: 07/04/00 Adm. Date:  [id number] Disc. Date: [id number] Attending:  Silvestre Mesi CC:         Bernadene Person, M.D.  [facility]   Cardiac Catheterization  PROCEDURES: 1. Left heart catheterization. 2. Coronary cineangiography. 3. Left ventricular cineangiography. 4. Abdominal aortogram. 5. Perclose of the right femoral artery.  INDICATION FOR PROCEDURES:  This 60 year old female presented to the emergency room of [facility] July 03, 2000, after severe anterior chest pain which started the night before.  It waxed and waned throughout the night and was very similar to her prior episodes which required angioplasty with stent placement in her mid and proximal circumflex coronary artery in 1999 and 2001.  Her electrocardiogram was changed showing new ST-T wave abnormality in her lateral leads.  Her enzymes were normal.  With the severity of chest pain similar to her prior ischemic episodes and the change in her electrocardiogram, we then scheduled her for repeat cardiac catheterization and possible angioplasty.  She also has a history of diabetes, hypercholesterolemia, stroke, peripheral vascular disease status post bilateral femoral-popliteal surgery and left renal artery stenosis.  PROCEDURE IN DETAIL:  After signing an informed consent, the patient was premedicated with 50 mg of Benadryl intravenously and brought to the cardiac catheterization lab.  Her right groin was prepped and draped in a sterile fashion and anesthetized locally with 1% lidocaine.  A 6-French introducer sheath was inserted percutaneously into the right femoral artery, and 6-French, #4 Judkins coronary cathethers were used to make injections into the native coronary arteries.  A 6-French pigtail catheter was used to measure pressures in  the left ventricle and aorta and to make midstream injections into the left ventricle and abdominal aorta.  The patient tolerated the procedure well, and there were no complications.  At the end of the procedure, the catheter and sheath were removed from the right femoral artery, and hemostasis was easily obtained with a Perclose closure system.  MEDICATIONS GIVEN:  None.  HEMODYNAMIC DATA:  Left ventricular pressure 158/0 to 17, aortic pressure 158/73 with a mean of 103.  Left ventricular ejection fraction was measured at 65%.  CINE FINDINGS:  CORONARY CINEANGIOGRAPHY:  Left coronary artery: The ostium and left main appear normal.  Left anterior descending artery: The proximal LAD has mild irregularities causing mild stenosis of 20 to 30%.  There is very normal antegrade flow.  The middle and distal segments appear normal with normal flow.  The diagonal and septal branches appear normal.  When compared to the prior study in June 2001, the LAD appears to be essentially unchanged.  Circumflex coronary artery:  The circumflex is the site of two prior angioplasties with stent placement.  The proximal site with stent placement was performed in June 2001 and now has a very mild restenosis of approximately 20%.  There is very normal antegrade flow through this segment.  The middle segment is the site of the angioplasty with stent placement in 1999, and this segment now appears normal.  There is very normal flow and normal appearance into the distal circumflex and obtuse marginal branches.  Right coronary artery: The ostium appears normal.  The proximal segment has a segmental plaque with a focal area of 30 to 40% stenosis.  The middle segment has a  focal stenosis of 40 to 50%.  The acute angle and beyond appears normal and has very normal antegrade flow.  When compared to the prior study in June 2001, there appears to be a very mild increase in stenosis in the proximal segment.  LEFT  VENTRICULAR CINEANGIOGRAM:  Left ventricular chamber size and contractility appear normal.  The left ventricular contractility appears normal without segmental abnormality.  The mitral and aortic valves appear normal.  ABDOMINAL AORTOGRAM:  The abdominal aorta appears to be normal without significant plaque and has normal flow.  The right renal artery appears normal.  The left renal artery has a mild lesion causing a 20 to 30% stenosis. This appears to be unchanged.  FINAL DIAGNOSES: 1. Very good appearance of the prior angioplasty stent sites in the proximal    and mid circumflex coronary artery with very mild restenosis within the    proximal stent. 2. No change in her left anterior descending with mild stenosis in the middle    segment. 3. Mild progression of disease in the proximal right coronary artery lesion    now 40 to 50% with moderate stenosis but with normal antegrade flow. 4. Normal left ventricular function. 5. Normal mitral and aortic valves. 6. Normal abdominal aorta. 7. Mild left renal artery stenosis, unchanged. 8. Successful Perclose of the right femoral artery.  DISPOSITION:  Will monitory on the short-stay unit prior to discharge in several hours.  Will continue on the same medication program and encourage her to again decrease and stop smoking. DD:  07/04/00 TD:  07/05/00 Job: [id number]

## 2010-10-19 NOTE — Cardiovascular Report (Signed)
[place]. [facility]  Patient:    Brenda [name], Brenda [name] [facility] Visit Number: [id number] MRN: [id number]          Service Type: MED Location: [id number] Attending Physician:  Silvestre Mesi Dictated by:   Aram Candela. Aleen Campi, M.D. Proc. Date: 10/22/01 Admit Date:  10/20/2001 Discharge Date: 10/23/2001   CC:         Cardiac Catheterization Lab   Cardiac Catheterization  CONTINUATION  CINE FINDINGS: 1. Left coronary artery:  The ostium of the left main appeared normal.  2. Left anterior descending:  The proximal segment has an eccentric plaque of    30-40% stenosis.  The middle segment has a concentric plaque causing a 50%    stenosis.  The mid to distal segment appears normal and there is a severe    stenosis in the distal segment at the apex of 70%.  The apex then has    mildly decreased flow.  3. Left circumflex coronary artery:  The proximal segment is the site of the    prior angioplasty and there is normal appearance within the stent.  Just    proximal to the stent, there is a stenotic area of approximately 40-50%.    There is normal antegrade flow through this area.  The second stent is in    the large first obtuse marginal branch and this area again appears normal    without significant restenosis.  There is normal antegrade flow throughout    the circumflex system with normal runoff.  4. Right coronary artery:  The right coronary artery has a normal ostium.  In    the proximal segment in the shepherds hook, there is a focal napkin    ring-like appearance of a stenotic lesion with appearance of an 80%    stenosis.  In the middle segment, there is a second plaque showing a 50-60%    stenosis.  In the middle segment just before the takeoff of the right    ventricular branch, there is a focal concentric plaque showing a 50-60%    stenosis.  LEFT VENTRICULAR CINEANGIOGRAM:  The left ventricular chamber size and contractility appear normal.  The  ejection fraction is between 60% and 70%. The left ventricular wall thickness appears normal.  The mitral and aortic valves appear normal.  IVUS PROCEDURES:  Cines taken during the IVUS procedure showed proper positioning of the guidewire and results of the IVUS showed a significant difference between the angiography cines.  The middle segment lesion near the takeoff of the right ventricular branch was much more severe showing a 70% stenosis.  The lesion between the right ventricular branch and the shepherds hook was also much more severe showing a 70-80% stenosis.  The lesion in the proximal segment within the shepherds hook was critical and showing a 90% stenosis.  The distal IVUS in the middle segment showed essentially normal right coronary just prior to the acute angle.  ANGIOPLASTY CINES:  Cines taken during the angioplasty procedure showed proper positioning of the stent deployment systems and very good balloon form obtained in both sites.  The final injections into the right coronary artery showed an excellent angiographic result with 0% residual lesion and no evidence for dissection or clot.  There was normal antegrade flow.  FINAL DIAGNOSES: 1. Progression of disease in the right coronary artery, now with critical    stenosis in the proximal segment and severe stenosis in the middle segment. 2. Successful angioplasty with primary  stent placement in the mid and proximal    right coronary artery lesions. 3. Very good appearance of the two prior stents in her circumflex and obtuse    marginal branch. 4. Moderate stenosis in the left anterior descending artery. 5. Mild stenosis, proximal circumflex, prior to the stent. 6. Normal left ventricular function. 7. Successful Perclose of the right femoral artery.  DISPOSITION:  Will monitor on the EAD overnight with continuation of the Integrilin drip.  We anticipate discharge in the a.m. Dictated by:   Aram Candela. Aleen Campi,  M.D. Attending Physician:  Silvestre Mesi DD:  10/22/01 TD:  10/25/01 Job: [id number]

## 2010-10-19 NOTE — Cardiovascular Report (Signed)
NAMEHAELIE, CLAPP               ACCOUNT NO.:  [id number]   MEDICAL RECORD NO.:  [id number]          PATIENT TYPE:  INP   LOCATION:  5031                         FACILITY:  [facility]   PHYSICIAN:  Antionette Char, MD    DATE OF BIRTH:  [birth year]   DATE OF PROCEDURE:  10/22/2005  DATE OF DISCHARGE:                              CARDIAC CATHETERIZATION   SURGEON:  Antionette Char, MD   PROCEDURES:  1.  Left heart catheterization.  2.  Coronary Cine angiography.  3.  Left ventricular angiography.  4.  Abdominal aortogram.  5.  Angio-Seal of the right femoral artery.   INDICATIONS FOR PROCEDURE:  This 60 year old female has a history of  multivessel coronary artery disease and is status post multiple  angioplasties beginning in 1999.  She has had a total of five stent  placements beginning in 1999, again in 2001, and also in 2003.  She recently  had the onset of mild exertional angina.  She had a stress test done because  of the recent onset of exertional angina and the test was borderline  positive with poor exertion tolerance.  She now needs surgery on her foot  and was scheduled for elective surgery; however, the pain became worse, and  she needed semi-urgent surgery, and because of her recent exertional angina  and borderline stress test with history of multivessel coronary artery  disease, we felt that she should have a repeat cardiac catheterization to  assess her coronary status.  She also has a history of peripheral vessel  disease and is status post right fem-pop surgery.  She also has a history of  hypertension.   DESCRIPTION OF PROCEDURE:  After signing an informed consent, the patient  was premedicated with 5 mg of Valium by mouth and brought to the cardiac  catheterization lab at [facility].  Her right groin was prepped and  draped in a sterile fashion and anesthetized locally with 1% lidocaine.  A 6-  French introducer sheath was inserted percutaneously into  the right femoral  artery.  The 6-French #4 Judkins coronary catheters were used to make  injections into the native coronary arteries.  A 6-French pigtail catheter  was used to measure pressures in the left ventricle and aorta and to make  midstream injections into the left ventricle and abdominal aorta.  The  patient tolerated the procedure well and no complications were noted at the  end of the procedure.  The catheter and sheath were removed from the right  femoral artery and hemostasis was easily obtained with an Angio-Seal closure  system.   MEDICATIONS GIVEN:  None.   HEMODYNAMIC DATA:  There was no gradient across the aortic valve.   Cine findings:  Coronary Cine angiography left coronary artery:  The ostium and left main  appear normal.  Left anterior descending:  The LAD has mild stenosis, 20-30%, in the  proximal-to-mid segment before the second anterolateral branch and second  septal branch.  This is also prior to the mid LAD stent.  The mid LAD stent  is widely patent without evidence  of restenosis.  The remainder of the mid  and distal segment of the LAD appears normal and has normal antegrade flow.  The diagonal and septal branches appear normal and had normal flow.  Circumflex coronary artery:  The circumflex coronary artery has a very mild  stenosis of 10-20% in its proximal segment.  The two stents in the  circumflex system are widely patent and appear normal with normal flow and  normal distal runoff.  The proximal 10-20% stenosis is prior to the two  stents.  The large obtuse marginal branch appears normal.  Right coronary artery:  The ostium appears normal.  There are two long  stents in the proximal segment.  There is a mild 20% stenosis at the  junction of the two stents, and distal to the two stents, there is a mild-to-  moderate stenosis of approximately 40%.  This is a focal eccentric lesion  and has normal antegrade flow and normal distal runoff.  The distal  right  coronary artery appears normal with a normal-appearing acute angle, normal-  appearing right coronary artery ant the crux and normal posterior descending  and posterolateral branches.  Left ventricular Cine angiogram:  The left ventricular chamber size,  contractility, and wall thickness appear normal.  The ejection fraction was  estimated at 70%.  The mitral and aortic valves appear normal.  Abdominal aortogram:  The abdominal aorta is normal in appearance and has  rapid runoff.  The right renal artery has a moderate stenosis of  approximately 40-50%.  The left renal artery appears normal.  There is  normal flow in both renal arteries with normal runoff.   FINAL DIAGNOSES:  1.  Very good long-term angioplasty results from five stents placed in 1999,      2001, and 2003.  2.  Mild stenosis proximal to the mid left anterior descending stent, 20%.  3.  Very good appearance of the circumflex within the two circumflex stents.  4.  Mild stenosis at the junction of the two right coronary artery stents,      10-20% in the proximal right coronary artery.  Also, mild-to-moderate      40% stenosis distal to the two right coronary artery stents in the mid      section with good antegrade flow.  5.  Normal left ventricular function, ejection fraction 70%.  6.  Mild-to-moderate right renal artery stenosis, 40%.  7.  Normal abdominal aorta with normal runoff.  8.  Successful Angio-Seal to the right femoral artery.   DISPOSITION:  We will continue to treat her medically and feel that she has  low risk cardiovascular wise and is an acceptable risk for surgery including  general anesthesia.  We will do routine cardiac monitoring post  catheterization, and she is essentially stable for her orthopedic surgery  later today or tomorrow.      Antionette Char, MD  Electronically Signed     JRT/MEDQ  D:  10/22/2005  T:  10/22/2005  Job:  [id number]   cc:   Catheterization Lab

## 2010-10-19 NOTE — Op Note (Signed)
NAME:  Brenda [name], Brenda [name]               ACCOUNT NO.:  [id number]   MEDICAL RECORD NO.:  [id number]          PATIENT TYPE:  INP   LOCATION:  5031                         FACILITY:  [facility]   PHYSICIAN:  Dyke Brackett, M.D.    DATE OF BIRTH:  01-Mar-1951   DATE OF PROCEDURE:  10/23/2005  DATE OF DISCHARGE:                                 OPERATIVE REPORT   INDICATIONS:  This is a 60 year old who was seen over the weekend by Dr.  Marciano Sequin with a minimally displaced bi-malleolar ankle fracture.  The patient  was prepped for surgery early in the week.  Had issues related to cardiology  clearance.  Had a catheterization which showed no significant coronary  disease that would preclude surgery and now was operated on Oct 23, 2005.   POSTOPERATIVE DIAGNOSIS:  Her postoperative diagnosis was actually mildly  displaced trimalleolar ankle fracture.   OPERATION/PROCEDURE:  Open reduction and internal fixation trimalleolar  ankle fracture.   SURGEON:  Dyke Brackett, M.D.   ASSISTANT:  Legrand Pitts. Duffy, P.A.-C.   TOURNIQUET TIME:  55 minutes.   DESCRIPTION OF PROCEDURE:  Sterile prep and drape.  Exsanguination of the  leg.  Inflation to 350 mmHg.  Incision of the fibula with a anterior  curvilinear incision on the medial side of the ankle.  A 7-hole plate was  placed with anatomic reduction of the fibula, type B fracture with excellent  purchase approximately, fairly good purchase distally but the patient was  moderately osteoporotic.  Good positioning of the plate was obtained  relative to checking with the mini C-arm fluoroscopy.   The medial malleolus was moderately comminuted.  Somewhat of a small  fragment was anatomically reduced.  Excellent purchase was obtained with one  4.5 cannulated screw.  It was really thought that the fragment was too small  for a second screw.  Again all screws appeared to be in good position with  an anatomic reduction in the AP and lateral and oblique plane.  The  wounds  were irrigated and closed with 2-0 Vicryl and skin closed.  Marcaine without  epinephrine, 20 mL 0.5% infiltrated the skin.  Compressive sterile dressing  and posterior splint applied.  The tourniquet was released after application  of the dressing before application of the splint.      Dyke Brackett, M.D.  Electronically Signed     WDC/MEDQ  D:  10/23/2005  T:  10/24/2005  Job:  [id number]

## 2010-10-19 NOTE — H&P (Signed)
[place]. [facility]  Patient:    Brenda [name], Brenda [name] [facility]                 MRN: [id number] Adm. Date:  [id number] Attending:  Silvestre Mesi Dictator:   Moshe Salisbury, N.P.                         History and Physical  DATE OF BIRTH: [birth year]  CHIEF COMPLAINT: Chest pain.  HISTORY OF PRESENT ILLNESS: The patient was mopping at work this morning and had sharp severe chest pains and a heavy feeling similar to when she had her stent put in.  She is still having some discomfort in the office this morning. She did not have diaphoresis though she did get a burning sensation in her face.  She had heartburn last night which was severe and has had frequent nausea recently.  The chest pain was in her mid sternum and it was sometimes severe but mostly moderate.  It hurt to move when she was mopping and hurt to breathe deep at that time.  It also hurt for her to bend over.  When she had chest pain likes this last time while mopping she had to be admitted and catheterized and received a stent.  She will be admitted to the hospital for unstable angina and will be catheterized tomorrow.  PAST MEDICAL HISTORY:  1. Non-insulin dependent diabetes.  2. History of colonic polyps.  3. Polycythemia.  4. COPD.  5. Hypertension.  6. Frequent fatigue.  7. History of gastritis.  8. History of hemorrhoids.  9. Atherosclerotic peripheral vascular disease with claudication. 10. Left renal artery disease. 11. CVA in March 2000. 12. Continues to smoke.  PAST SURGICAL HISTORY:  1. Polypectomy of colonic polyps in 1990.  2. PTCA and stent placement in September 1999 by Dr. Charolette Child for     intermediate coronary syndrome.  3. Hysterectomy in 1988 with both ovaries retained according to Dr. Lacretia Nicks.     Christie Beckers.  4. Left and right femoral popliteal bypass surgery by Dr. Kristen Loader Early,     the first being in January 1999 and the second in January 2000.  5.  Retention cyst in the throat excised by Dr. Kathy Breach in March 2000.  6. Carpal tunnel surgery on the right hand.  FAMILY HISTORY: The patients father died in 36 of cancer of the throat at age 5.  He had diabetes mellitus and also had had a heart attack and suffered from angina.  Her mother died of pancreatic cancer at the age of 42.  She was diabetic and also had a heart attack and underwent coronary artery bypass grafting surgery.  SOCIAL HISTORY: The patient currently smokes around one pack of cigarettes per day.  She works for [employer].  She does not use any alcohol.  She does not abuse drugs.  REVIEW OF SYSTEMS: CONSTITUTIONAL: The patient denies fevers, chills, or sweats.  Her weight has been up and down for the past several years about six pounds.  She does have some ankle edema.  She denies any difficulty sleeping. CARDIOVASCULAR: Chest pain as noted.  The patient denies palpitations, paroxysmal nocturnal dyspnea, and orthopnea.  She has, however, dyspnea on exertion and some claudication.  RESPIRATORY: The patient has some cough lately producing clear to yellow sputum.  She wheezes at times and continues to smoke one pack of cigarettes a day.  GASTROINTESTINAL:  The patient denies constipation or diarrhea.  She does have some difficulty swallowing and has had a lot of nausea lately, and had some severe heartburn last night.  She has had a sore throat off and on for several weeks.  GENITOURINARY: The patient denies dysuria, pyuria, hematuria, or anuria, but she does have nocturia x 1-2.  MUSCULOSKELETAL: The patient states that her joints hurt all over her body all of the time and that she has some cramps in her feet and her left leg.  She feels weakness in her knees and she falls easily since her stroke last year.  NEUROLOGIC: The patient denies faintness or syncope.  She does get dizzy.  There is no change in memory.  Her fingers get numb on occasion and she does have  a history of having had a stroke last year.  PHYSICAL EXAMINATION:  VITAL SIGNS: The patient is 60 years old and weighs 185 pounds.  Her blood pressure is 134/74, pulse 80, respirations 18, and she is afebrile.  Height is 5 feet 6 inches.  GENERAL: The patient is a well-developed, well-nourished white female in no acute distress.  Her nutrition appears to be good and she is well groomed.  HEENT: PERRL.  She has no nasal discharge.  Her mouth is moist.  Oropharynx is benign.  Head normocephalic, atraumatic.  She hears without difficulty.  NECK: Supple.  She has a midline trachea.  She is without jugular venous distention or thyromegaly.  CHEST: Clear to auscultation and percussion.  She is eupneic.  BREAST: Normal contour without discharge or tenderness.  ADENOPATHY: The patient is without cervical adenopathy.  CARDIAC: The patient has a regular rate and rhythm, S1 and S2 are heard.  No murmurs, rubs, gallops, or clicks are auscultated.  ABDOMEN: Positive bowel sounds, nontender.  The abdomen is soft and nondistended.  GENITOURINARY: The patient is status post hysterectomy and is nontender over her bladder.  EXTREMITIES: The patient moves all extremities x 4.  Strength is 5/5 in the upper and lower extremities.  She is without ankle edema.  SKIN: Warm and dry.  She is without jaundice, cyanosis, pallor, or rash. Capillary refill is brisk.  NEUROLOGIC: The patient is conscious, alert and oriented to person, place, time, and situation.  She is without tremors.  Cranial nerves 2-12 are grossly intact.  Gait is steady.  PSYCHIATRIC: The patient is pleasant, cooperative, and responds appropriately.  IMPRESSION:  1. Unstable angina.  2. Coronary artery disease, status post stent placement.  3. Cerebrovascular disease, status post cerebrovascular accident.  4. Atherosclerotic peripheral vascular disease with claudication, status post     bypass of the femoral arteries.  5.  Renal artery disease.  6. Non-insulin dependent diabetes mellitus.  7. Positive for smoking.  PLAN:   1. Admit to [facility] 3700 unit for telemetry.  2. Nitroglycerin drip for chest pain.  3. Low molecular weight heparin per pharmacy protocol.  4. Serial enzymes.  5. Pre-catheterization laboratories.  6. Oxygen.  7. Cardiac catheterization by Dr. Aleen Campi tomorrow. DD:  11/19/99 TD:  11/20/99 Job: [id number]

## 2010-10-19 NOTE — Cardiovascular Report (Signed)
[place]. [facility]  Patient:    Brenda [name], Brenda [name] [facility] Visit Number: [id number] MRN: [id number]          Service Type: MED Location: [id number] Attending Physician:  Silvestre Mesi Dictated by:   Aram Candela. Aleen Campi, M.D. Proc. Date: 10/22/01 Admit Date:  10/20/2001 Discharge Date: 10/23/2001                          Cardiac Catheterization  PROCEDURE DONE BY:  Jonny Ruiz R. Aleen Campi, M.D.  PROCEDURES: 1. Left heart catheterization. 2. Coronary cineangiography. 3. Left ventricular cineangiography. 4. IVUS of the right coronary artery. 5. Percutaneous transluminal coronary angioplasty with primary stent placement    in the proximal and mid right coronary artery. 6. Perclose of the right femoral artery.  INDICATION FOR PROCEDURES:  This 60 year old female has a history of coronary artery disease and unstable angina.  She had her first angioplasty procedure in September 1999 when she had cardiac catheterization and angioplasty with stent placement in her large first obtuse marginal branch.  She returned with unstable angina in June 2001 at which time she had a new lesion in her proximal circumflex which, again, had successful angioplasty and primary stenting.  She then was recatheterized in February 2002, finding good long-term results in the 2 stented areas and mild progression of disease in her right coronary artery and LAD.  She now returns with her very typical angina pattern and had relief of her chest pain only in the emergency room with nitroglycerin intravenously.  She was then scheduled for repeat cardiac catheterization and possible angioplasty.  PROCEDURE:  After signing an informed consent, the patient was premedicated with 50 mg of Benadryl intravenously and brought to the cardiac catheterization lab.  Her right groin was prepped and draped in a sterile fashion and anesthetized locally with 1% lidocaine.  A #6 French introducer sheath was  inserted percutaneously into the right femoral artery.  The #6 [place] #4 Judkins coronary catheters were used to make injections into the native coronary artery.  A #6 French pigtail catheter was used to measure pressures in the left ventricle and aorta and to make a midstream injection into the left ventricle.  After noting progression of disease in her right coronary artery consistent with a severe lesion but unsure about the status of the proximal segment lesion, we elected to proceed with an IVUS for further definition of the mid and proximal right coronary artery lesions.  We then selected a #6 [place] JR4 guide catheter which was engaged in the ostium of the right coronary artery.  A short Hi-Torque Floppy guidewire was inserted through the guide catheter and advanced into the right coronary artery.  An IVUS catheter was then advanced over the guidewire and positioned in the mid to distal segment.  An automatic pullback was performed.  With the IVUS information, we were able to see a severe stenosis in the middle segment with several short severe stenotic areas between the middle segment and the proximal lesion.  In the proximal lesion, this turned out to be a critical stenotic lesion with a 90% stenosis.  After obtaining this information, we then proceeded with sizing the vessel and selected a 3.0 x 24-mm Express 2 Monorail stent deployment system which was advanced over the guidewire and positioned within the middle segment.  After positioning the stent, the stent was deployed with a maximum pressure of 12 atmospheres for 22 seconds.  After deflating  and removing the deployment balloon, injections again into the right coronary artery showed an excellent angiographic result in the middle segment and then after sizing the proximal area, we selected a 3.5 x 16-mm Express 2 Monorail stent system.  This stent was advanced over the guidewire and positioned within the proximal lesion  slightly overlying the prior stent. This second stent was then deployed with 1 inflation at 14 atmospheres for 38 seconds.  After this second stent was deployed, the deployment balloon was removed and final injections in the right coronary artery showed an excellent angiographic result with 0% residual lesion and no evidence for dissection or clot.  There was normal TIMI-3 antegrade flow.  The patient tolerated the procedure well and no complications were noted.  At the end of the procedure, the catheter and sheath were removed from the right femoral artery and hemostasis was easily obtained with a Perclose closure system.  MEDICATIONS GIVEN:  Heparin, 5000 units, IV; Integrilin drip per pharmacy protocol.  HEMODYNAMIC DATA: 1. Left ventricular pressure 139/0-7. 2. Aortic pressure 139/60 with a mean of 89. 3. Left ventricular ejection fraction was estimated at 60%.  CINE FINDINGS:  CORONARY CINEANGIOGRAPHY: 1. Left coronary artery:  The ostium and left main appear normal.  2. Left anterior descending:  The LAD has an eccentric plaque in the proximal    segment causing a 40-50% stenosis.  The middle segment has a focal    concentric plaque causing a 50-60% stenosis.  The distal segment appears    normal.  Dictated by:   Aram Candela. Aleen Campi, M.D. Attending Physician:  Silvestre Mesi DD:  10/22/01 TD:  10/24/01 Job: [id number]

## 2010-10-19 NOTE — Cardiovascular Report (Signed)
[place]. [facility]  Patient:    Brenda [name], MAPP [facility] Visit Number: [id number] MRN: [id number]          Service Type: MED Location: CCUA [id number] Attending Physician:  Silvestre Mesi Dictated by: Pearletha Furl Alanda Amass, M.D. Proc. Date: 10/28/01 Admit Date: 10/28/2001   CC:         Aram Candela. Aleen Campi, M.D.  Bernadene Person, M.D.             Cardiopulmonary Lab                        Cardiac Catheterization  PROCEDURES:  1. Retrograde central aortic catheterization.  2. Selective coronary angiography, pre and post IC nitroglycerin     administration.  3. Left ventriculogram LAO and RAO projection.  4. Abdominal aortic angiogram, midstream PA projection.  5. Intracoronary nitroglycerin administration.  6. Subselective LIMA.  7. IVUS interrogation, proximal and mid LAD.  8. PTCA and stent of high-grade proximal-mid LAD stenosis.  9. PTCA sidebranch DX2 ostial fractious stenosis. 10. Double bolus Integrilin. 11. Preoperative aspirin and Plavix.  BRIEF HISTORY:  Brenda [name] is the patient of Dr. Juleen China andDr. Aleen Campi, with significant cardiovascular disease.  She is a 60 year old white married mother of four, with two grandchildren, and works full-time at [employer] as a Location manager.  She is a continued smoker, despite her long history of coronary and peripheral arterial disease.  She suffered a prior CVA, right brain, with transient left hemiparesis, with good recovery of approximately 3-4 years ago.  Has been treated medicallywith long-term Aggrenox therapy (low-dose aspirin plus Persantine), without recurrence.  She has had bilateral fem-pop bypass grafts by Dr. Barbie Banner 1999 and 2000 for lower extremity claudication.  She has had prior mid circumflex stenting in 1999, and for progression of disease proximal circumflex stenting June 2001 by Dr. Aleen Campi.  She was recently hospitalized at [facility] for chest pain compatible with acute coronary  syndrome, without MI.  On Oct 20, 2001 underwent catheterization. She was found to have a high-grade proximal right coronary stenosis on IVUS interrogation by Dr. Aleen Campi, along with segmental disease in the proximal and mid RCA.  She underwent tandem stenting with 3.0 x 24 mm and a 3.5 x 16 mm Express II stents, with good angiographic result.  She had associated LAD disease and was to be treated medically for this.  The patient, despite being instructed at discharge, did not fill her Plavix prescription. he did well after discharge, had a Perclose right femoral artery closure successfully, and was able to go back to work.  She works the night shift.  In theearly a.m. of Oct 28, 2001 (at 2-3 a.m.) she began having severe substernal chest pain with radiation to the neck, nausea and sweat.  She called her husband, who brought her to the emergency room.  She was seen by Dr. Stacie Acres with ongoing chest pain; was given morphine, IV nitroglycerin, heparin bolus plus drip.  She was having continued chest pain with 1 mm ST elevation in the inferior leads.  When I saw her with Dr. Stacie Acres there was concern that she might be developing subacute thrombosis of her RCA stents, particularly since she had not been on Plavix post procedure.  With ongoing chest pain it was felt best to bring her to the laboratory for evaluation.  Review of her cineangiogram showed disease of the distal LAD before the apex, of 70-80% and possible lesion of the  proximal-mid LAD beyondthe DX2; borderline significance, with 40-50% segmental "lumpy-bumpy" type proximal LAD disease not felt to be high grade.  The patient was given Aggrastat single bolus in the ER, and a second bolus in the lab.  Shewas given 300 mg of Plavix and four baby aspirin to chew.  She was on IVnitroglycerin.  Pain was only mild on entering the laboratory.  DESCRIPTION OF PROCEDURE:  The left groin was prepped and draped in the usual manner.  Xylocaine 1%  was used for local anesthesia.  The LFA was entered above the previously placed LFPBG with a single anterior puncture, using an 18-gauge thin walled needle and a 6-French short sidearm sheath; which was inserted without difficulty.  Diagnostic coronary angiographywas done with 6-French 4 cm taper preformed Cordis coronary and pigtail catheters.  Subselective LIMA was done with the right coronary catheter.LV angiogram was done in the RAO and LAO projection at 25 cc 14 cc/sec, and 20 cc 12 cc/sec respectively.  Pullback pressures of the CA showed nogradient across the aortic valve.  Abdominal aortic angiogram done in the midstream PA projection at 20 cc 20 cc/sec revealed approximately 30% proximal left renal artery narrowing, with good flow and no significant right renal artery narrowing.  The renal arteries were single.  The celiac and SMA access were intact -- with limited dye, moderate infrarenal atherosclerotic disease with no obstruction and good runoff.  PRESSURES: 1. LV:  120/0. 2. LVEDP:  18 mmHg. 3. CA:  120/70 mmHg. 4. There is no gradient across the aortic valve on catheter pullback.  LEFT VENTRICULOGRAM:  Performed in the RAO and LAO projections. Showed a normal contracting ventricle, with a question of a small area of LSR (late systolic relaxation) to the mid anterior wall.  EF was approximately 60% with no mitral regurgitation.  The LIMA was widely patent.  Left vertebral showed antegrade flow and there was 20-30% segmental narrowing of the proximal left subclavian, with no gradient.  NATIVE CORONARY ANGIOGRAPHY: 1. LEFT MAIN CORONARY ARTERY:  Normal.  2. LEFT ANTERIOR DESCENDING ARTERY:  Had a "lumpy-bumpy" appearance with    40-50% narrowing or less segmentally after the first septal perforator    and beforethe small first diagonal branch.  This was unchanged from    prior angiograms.      There appeared to be a mildly segmental 80-90% stenosis of the LAD     beyond the DX2.  The DX2 had a 50% narrowing in its inferior bifurcation    branch; was of moderate size and tortuous.  There was another 40-50%    smooth narrowing in the distal third of the LAD, and the 70-80% narrowing    before the apical bifurcation.  3. CIRCUMFLEX CORONARY ARTERY:  Gave off a small first marginal branch,    followed by a prior stent in the bend of the proximal circumflex; this    had smooth 40-50% narrowing.     The second stent beyond the small OM2 showedless than 10% narrowing.    There was 30% narrowing beyond this stent, with good flow.  The PABG    branch before the OM2 had a 70% lesion.  4. RIGHT CORONARY ARTERY:  A dominant, large vessel with a shepherds crook    proximal bend.  The tandem proximal stent showed excellent expansion.    There was no thrombus.  There was normal TIMI-3 flow to the dominant    right coronary artery, with a large normal PDA and PLA.  There was a    minimal irregularity in the proximal third of the stent superiorly, but    no dissection or dye staining evident, and 0% early renarrowing.  The    left atrial branch had a 90% proximal stenosis, which was coming from the    proximal third of the stent (which was unchanged from prior angiogram).    The RV branch from the mid circumflex was intact.     There was 2+ calcification of the LAD and right coronary, and 1+ to the    right coronary.  No intracardiac or valvular calcification.  CATHETERIZATIONASSESSMENT:  Preoperatively there was concern over early subacute thrombosis of the right coronary tandem stents, placed Oct 22, 2001; but, this is not the case.  Her chest pain is historically most compatible with ischemia.  It was felt that her culprit lesion at this time could be the LADjust beyond the DX2.  It was elected to proceed with IVUS interrogation in this setting.  IVUS INTERVENTIONAL PROCEDURE:  The patient was continued on heparin and Integrilin, and IV nitroglycerin.   The left coronary was intubated with a JL4 SciMed soft-tip guiding catheter.  The LAD was crossed with an HTF 0.014 inch wire.  An Atlantis SciMed IVUS was then used for IVUS interrogation.  Interrogation of the culprit lesion beyond the DX2 showed that reference diameter had moderate plaque, and was approximately 2.8 x 2.7 cm with eccentric plaque beyond the lesion.  The lesion itself was only slightly bigger than the ultrasound artifact, and dimension was approximately 1.5 to 1.8 mm cross-sectional area.  The proximal LAD had 40-50% cross-sectional area narrowing, with plaque before the DX1.  Because of high-grade stenosis of approximately 90%,it was elected to proceed with intervention of the LAD beyond the DX2.  Double wire technique was used and a 0.014 inch HTF was positioned in theDX2 to help with stent positioning and possible sidebranch dilatation for plaque shift.  The LAD was primarily stented with a 3.0 x 13 mm drug alluding stent (Cordis Cypher), deployed at 10 atm for 36 sec.  There was pinching of the DX2 at the ostial part.  This was initially dilated with a 2.5 x 15 mm CrossSail balloon at 6 atm for 60 sec.  There was still residual stenosis, but we were unable to recross with this balloon.  The wire was pulled back and the lesion was recrossed with a 0.014 Choice PT wire, and a 2.0 x 15 mm CrossSail balloon. Inflation at the ostia was done at 8 atm for 51 sec.  The balloon was upgraded to a 2.5 x 10 mm CrossSail.  Using exchange technique, the ostium of the DX2 was dilated at 8-51at 7-27 and the balloon was pulled back.  The ostium was reduced from 90% to less than 30, with good flow.  However, there was an area of spasm or wire damage in the proximal third of the DX2.  There was good flow and it was elected not to proceed with any further intervention of the DX2. There was TIMI-3 flow to the LAD and the stenosis was reduced from 90% to -10%.  The patient was pain  free at this time.  ACT was followed.  The sidearm sheath was flushed, after dilatation system was removed; secured to theskin to prevent migration.  The patient was transferred to the holding area for postoperative care in stable condition.  The cineangiograms and IVUS were reviewed with Dr. Aleen Campi.  The patient has had  successful presumed culprit lesion stenting of her LAD beyond the DX2, and PTCA of the DX2 ostia for plaque shift.  She has residual disease segmentally in the proximal LAD of 40-50%, with 40% in the mid to distal LAD, and 70-80% before the apex.  There is no restenosis of her prior circumflexstents placed February 01, 1998, and the proximal circumflex of June 19,2001.  There is no restenosis of her proximal-mid tandem RCA stents recently place on Oct 22, 2001, and there is good LV function.  I would recommend the patient be on chronic aspirin and Plavix, discontinuation of smoking, along with continued medical therapy of her associated problems -- including: hyperlipidemia, diabetes.  CATHETERIZATION DIAGNOSES:  1. Atherosclerotic heart disease, unstable angina without myocardial     infarction.  2. Culprit lesion PTCA and stent, proximal-mid LAD beyond DX2 after     IVUS interrogation (see above).  3. Sidebranch DX2 ostial PTCA for plaque shift.  4. Residual LAD disease.  5. No restenosisproximal-mid tandem RCA stents (Oct 22, 2001).  6. No restenosis mid circumflex stent (February 01, 1998 3.5 x 9 NIR Primo).  7. No restenosis of proximal circumflex stent (November 20, 1999,     3.5 x 12 mm NIR Elite). 8. No restenosis of tandem RCA stents (Oct 22, 2001; 26 plus 3.5 x 16 mm     SciMed Express II).  9. Normal LV function. 10. Adult-onset diabetes mellitus. 11. Hyperlipidemia. 12. Past cerebrovascular accident, on chronic Aggrenox therapy. 13. Status post bilateral fem-pop bypass grafts, Dr. Arbie Cookey in 1999 and 2000. 14. Remote hysterectomy. Dictated by: Pearletha Furl  Alanda Amass, M.D. Attending Physician:  Charolotte Eke DD:  10/28/01 TD:  10/29/01 Job: [id number]

## 2010-10-19 NOTE — Cardiovascular Report (Signed)
[place]. [facility]  Patient:    Brenda [name], Brenda [name] [facility]                 MRN: [id number] Proc. Date: 11/20/99 Adm. Date:  [id number] Disc. Date: [id number] Attending:  Silvestre Mesi CC:         Aram Candela. Aleen Campi, M.D.             Cardiac Catheterization Laboratory                        Cardiac Catheterization  PROCEDURES: 1. Left heart catheterization. 2. Coronary cineangiography. 3. Left internal mammary artery cineangiography. 4. Left ventricular cineangiography. 5. Abdominal aortogram. 6. Angioplasty with primary stenting of her proximal circumflex lesion. 7. Perclose of the right femoral.  INDICATIONS FOR PROCEDURE:  This 60 year old female was admitted on November 19, 1999, with the onset of severe anterior chest pain.  This chest pain was similar to the chest pain she had prior to her angioplasty in her mid circumflex in 1999.  She also has a history of hypertension.  DESCRIPTION OF PROCEDURE:  After signing an informed consent, the patient was premedicated with 50 mg of Benadryl intravenously and brought to the cardiac catheterization lab.  Her right groin was prepped and draped in a sterile fashion and anesthetized locally with 1% lidocaine.  A #6 French introducer sheath was inserted percutaneously into the right femoral artery.  A 6 French #4 Judkins coronary catheters were used to make injections into the coronary arteries.  The right coronary catheter was used to make a mid stream injection into the left subclavian artery visualizing the left internal mammary artery. A 6 French pigtail catheter was used to measure pressures in the left ventricle and aorta and to make mid stream injections into the left ventricle and abdominal aorta.  A 6 French XB 3.5 guide catheter was used to engage the left coronary artery for the angioplasty procedure.  A standard length Hi-Torque Floppy guide wire was inserted through the guide catheter and into the left  coronary artery.  With moderate difficulty it was advanced into the circumflex and across the proximal lesion.  We used a 3.5 x 12 mm Elite NIR stent deployment system which was advanced over the guidewire and with moderate difficulty advanced into the proximal circumflex lesion.  The stent was then deployed with one inflation at 14 atmospheres for 34 seconds.  After this stent was deployed the catheter was removed and injection again in the left coronary artery showed an excellent angiographic result with 0% residual lesion.  At the end of the procedure, the catheter and sheath were removed from the right femoral artery and hemostasis was easily obtained with a Perclose closure system.  MEDICATIONS GIVEN:  Heparin 4000 units IV.  ReoPro drip per pharmacy protocol. Morphine sulfate 1 mg IV.  Versed 1 mg IV.  HEMODYNAMIC DATA:  Left ventricular pressure 149/10-18, aortic pressure 149/75 with a mean of 101.  Left ventricular ejection fraction was estimated at 60-70%  CINE FINDINGS:  CORONARY CINE ANGIOGRAPHY:  Left coronary artery:  The ostium and left main appear normal.  Left anterior descending:  The proximal LAD has a segmental plaque which is eccentric and causes a 50% proximal lesion.  The mid LAD has several minor plaques and one eccentric plaque causing a 30% stenosis.  The distal LAD has a focal stenosis at the apex which appears to be 90%.  This is a short focal  concentric lesion which is very close to the termination of the LAD.  Circumflex coronary artery:  The proximal circumflex has a new stenotic lesion which is eccentric and focal and noncalcified.  The stenosis was estimated at 80-90%.  There is a minor plaque in the mid circumflex which is within the prior stent placed in 1999.  This stented area appears to have a very good long-term result.  There is normal flow through the stent.  Right coronary artery:  The ostium has a minor 20% stenosis.  The proximal right  coronary artery has a focal eccentric 40% stenosis followed by a segmental plaque which causes a 20-30% stenosis throughout the middle segment extending to the acute angle.  The distal right coronary artery appears normal.  Left internal mammary artery appears normal.  LEFT VENTRICULAR CINEANGIOGRAM:  The left ventricular chamber size and contractility appear normal.  The inferior apical and anterior segments contract normally.  The mitral and aortic valves appear normal.  ABDOMINAL AORTOGRAM:  The abdominal aorta has minor plaque in its distal segment which is nonobstructive and mildly calcified.  The left renal artery has a focal eccentric lesion of approximately 30%.  The takeoff of the right renal artery has a minor plaque.  ANGIOPLASTY PROCEDURE:  Cine taken during the angioplasty procedure shows proper positioning of the guidewire and stent deployment system.  Followup cine of the stent deployed shows an excellent angiographic result with 0% residual lesion and normal antegrade flow.  There was no evidence of dissection or clot.  FINAL DIAGNOSES: 1. New severe stenosis in the proximal circumflex. 2. Good long-term results in the mid circumflex stented area. 3. Mild progression of disease in the proximal left anterior descending    and proximal right coronary artery, now with moderate stenosis. 4. Normal left internal mammary artery. 5. Normal left ventricular function. 6. Moderate left renal artery stenosis. 7. Successful angioplasty with primary stent placement in the proximal    circumflex. 8. Successful Perclose of the right femoral artery.  DISPOSITION:  Will monitor on 6500 and anticipate discharge tomorrow.  Will also continue on ReoPro drip, Plavix and aspirin. DD:  11/20/99 TD:  11/22/99 Job: [id number]

## 2010-11-06 ENCOUNTER — Other Ambulatory Visit: Payer: Self-pay | Admitting: Endocrinology

## 2010-11-06 DIAGNOSIS — R10814 Left lower quadrant abdominal tenderness: Secondary | ICD-10-CM

## 2010-11-06 DIAGNOSIS — R634 Abnormal weight loss: Secondary | ICD-10-CM

## 2010-11-13 ENCOUNTER — Ambulatory Visit
Admission: RE | Admit: 2010-11-13 | Discharge: 2010-11-13 | Disposition: A | Discharge status: Home / Self Care | Payer: Medicare Other | Source: Ambulatory Visit | Attending: Endocrinology | Admitting: Endocrinology

## 2010-11-13 DIAGNOSIS — R634 Abnormal weight loss: Secondary | ICD-10-CM

## 2010-11-13 DIAGNOSIS — R10814 Left lower quadrant abdominal tenderness: Secondary | ICD-10-CM

## 2010-11-13 MED ORDER — IOHEXOL 300 MG/ML  SOLN
100.0000 mL | Freq: Once | INTRAMUSCULAR | Status: AC | PRN
  Administered 2010-11-13: 100 mL via INTRAVENOUS

## 2011-02-12 ENCOUNTER — Ambulatory Visit
Admission: RE | Admit: 2011-02-12 | Discharge: 2011-02-12 | Disposition: A | Discharge status: Home / Self Care | Payer: Medicare Other | Source: Ambulatory Visit | Attending: Radiation Oncology | Admitting: Radiation Oncology

## 2011-02-25 ENCOUNTER — Other Ambulatory Visit: Payer: Self-pay | Admitting: Oncology

## 2011-02-26 ENCOUNTER — Ambulatory Visit (HOSPITAL_COMMUNITY)
Admission: RE | Admit: 2011-02-26 | Discharge: 2011-02-26 | Disposition: A | Discharge status: Home / Self Care | Payer: Medicare Other | Source: Ambulatory Visit | Attending: Oncology | Admitting: Oncology

## 2011-02-26 DIAGNOSIS — J438 Other emphysema: Secondary | ICD-10-CM | POA: Insufficient documentation

## 2011-02-26 DIAGNOSIS — Z85810 Personal history of malignant neoplasm of tongue: Secondary | ICD-10-CM | POA: Insufficient documentation

## 2011-02-26 DIAGNOSIS — Z09 Encounter for follow-up examination after completed treatment for conditions other than malignant neoplasm: Secondary | ICD-10-CM | POA: Insufficient documentation

## 2011-02-26 DIAGNOSIS — R911 Solitary pulmonary nodule: Secondary | ICD-10-CM | POA: Insufficient documentation

## 2011-02-26 MED ORDER — IOHEXOL 300 MG/ML  SOLN
100.0000 mL | Freq: Once | INTRAMUSCULAR | Status: AC | PRN
  Administered 2011-02-26: 100 mL via INTRAVENOUS

## 2011-02-27 ENCOUNTER — Encounter (HOSPITAL_BASED_OUTPATIENT_CLINIC_OR_DEPARTMENT_OTHER): Payer: Medicare Other | Admitting: Oncology

## 2011-02-27 ENCOUNTER — Other Ambulatory Visit: Payer: Self-pay | Admitting: Oncology

## 2011-02-27 DIAGNOSIS — R5383 Other fatigue: Secondary | ICD-10-CM

## 2011-02-27 DIAGNOSIS — R5381 Other malaise: Secondary | ICD-10-CM

## 2011-02-27 DIAGNOSIS — R911 Solitary pulmonary nodule: Secondary | ICD-10-CM

## 2011-02-27 DIAGNOSIS — R918 Other nonspecific abnormal finding of lung field: Secondary | ICD-10-CM

## 2011-02-27 DIAGNOSIS — C01 Malignant neoplasm of base of tongue: Secondary | ICD-10-CM

## 2011-02-27 DIAGNOSIS — C029 Malignant neoplasm of tongue, unspecified: Secondary | ICD-10-CM

## 2011-02-27 LAB — COMPREHENSIVE METABOLIC PANEL
ALT: 14 U/L (ref 0–35)
Albumin: 4.2 g/dL (ref 3.5–5.2)
CO2: 26 mEq/L (ref 19–32)
Calcium: 8.9 mg/dL (ref 8.4–10.5)
Chloride: 100 mEq/L (ref 96–112)
Creatinine, Ser: 0.87 mg/dL (ref 0.50–1.10)
Sodium: 135 mEq/L (ref 135–145)
Total Protein: 6.6 g/dL (ref 6.0–8.3)

## 2011-02-27 LAB — CBC WITH DIFFERENTIAL/PLATELET
BASO%: 0.2 % (ref 0.0–2.0)
EOS%: 0.9 % (ref 0.0–7.0)
MCH: 30.6 pg (ref 25.1–34.0)
MCHC: 33.8 g/dL (ref 31.5–36.0)
MONO#: 0.3 10*3/uL (ref 0.1–0.9)
RBC: 4.55 10*6/uL (ref 3.70–5.45)
RDW: 14 % (ref 11.2–14.5)
WBC: 4.8 10*3/uL (ref 3.9–10.3)
lymph#: 0.8 10*3/uL — ABNORMAL LOW (ref 0.9–3.3)

## 2011-02-27 LAB — TSH: TSH: 2.851 u[IU]/mL (ref 0.350–4.500)

## 2011-03-01 ENCOUNTER — Encounter (HOSPITAL_COMMUNITY): Payer: Self-pay

## 2011-03-01 ENCOUNTER — Ambulatory Visit (HOSPITAL_COMMUNITY)
Admission: RE | Admit: 2011-03-01 | Discharge: 2011-03-01 | Disposition: A | Discharge status: Home / Self Care | Payer: Medicare Other | Source: Ambulatory Visit | Attending: Oncology | Admitting: Oncology

## 2011-03-01 DIAGNOSIS — J438 Other emphysema: Secondary | ICD-10-CM | POA: Insufficient documentation

## 2011-03-01 DIAGNOSIS — C029 Malignant neoplasm of tongue, unspecified: Secondary | ICD-10-CM

## 2011-03-01 DIAGNOSIS — Z85819 Personal history of malignant neoplasm of unspecified site of lip, oral cavity, and pharynx: Secondary | ICD-10-CM | POA: Insufficient documentation

## 2011-03-01 DIAGNOSIS — R911 Solitary pulmonary nodule: Secondary | ICD-10-CM

## 2011-03-01 DIAGNOSIS — J984 Other disorders of lung: Secondary | ICD-10-CM | POA: Insufficient documentation

## 2011-03-01 MED ORDER — IOHEXOL 300 MG/ML  SOLN
80.0000 mL | Freq: Once | INTRAMUSCULAR | Status: AC | PRN
  Administered 2011-03-01: 80 mL via INTRAVENOUS

## 2011-03-04 ENCOUNTER — Other Ambulatory Visit: Payer: Self-pay | Admitting: Oncology

## 2011-03-08 ENCOUNTER — Encounter (HOSPITAL_COMMUNITY): Payer: Self-pay

## 2011-03-08 ENCOUNTER — Encounter (HOSPITAL_COMMUNITY)
Admission: RE | Admit: 2011-03-08 | Discharge: 2011-03-08 | Disposition: A | Discharge status: Home / Self Care | Payer: Medicare Other | Source: Ambulatory Visit | Attending: Oncology | Admitting: Oncology

## 2011-03-08 DIAGNOSIS — C3490 Malignant neoplasm of unspecified part of unspecified bronchus or lung: Secondary | ICD-10-CM | POA: Insufficient documentation

## 2011-03-08 DIAGNOSIS — J438 Other emphysema: Secondary | ICD-10-CM | POA: Insufficient documentation

## 2011-03-08 LAB — GLUCOSE, CAPILLARY: Glucose-Capillary: 151 mg/dL — ABNORMAL HIGH (ref 70–99)

## 2011-03-08 MED ORDER — FLUDEOXYGLUCOSE F - 18 (FDG) INJECTION
17.0000 | Freq: Once | INTRAVENOUS | Status: AC | PRN
  Administered 2011-03-08: 17 via INTRAVENOUS

## 2011-03-14 LAB — I-STAT 8, (EC8 V) (CONVERTED LAB)
Chloride: 104
Glucose, Bld: 158 — ABNORMAL HIGH
HCT: 43
Potassium: 4
pCO2, Ven: 58.7 — ABNORMAL HIGH
pH, Ven: 7.29

## 2011-03-14 LAB — POCT I-STAT CREATININE: Operator id: 146091

## 2011-03-14 LAB — DIFFERENTIAL
Basophils Absolute: 0
Eosinophils Absolute: 0.2
Eosinophils Relative: 4
Lymphocytes Relative: 32
Lymphs Abs: 1.6
Neutrophils Relative %: 57

## 2011-03-14 LAB — URINALYSIS, ROUTINE W REFLEX MICROSCOPIC
Nitrite: NEGATIVE
Protein, ur: NEGATIVE
Specific Gravity, Urine: 1.017
Urobilinogen, UA: 1

## 2011-03-14 LAB — POCT CARDIAC MARKERS
CKMB, poc: <1 — ABNORMAL LOW
Operator id: 146091
Troponin i, poc: <0.05

## 2011-03-14 LAB — SEDIMENTATION RATE: Sed Rate: 17

## 2011-03-14 LAB — CBC
Platelets: 185
RDW: 15.7 — ABNORMAL HIGH
WBC: 5.1

## 2011-03-17 ENCOUNTER — Encounter: Payer: Self-pay | Admitting: Oncology

## 2011-05-27 ENCOUNTER — Encounter: Payer: Self-pay | Admitting: Radiation Oncology

## 2011-06-12 ENCOUNTER — Ambulatory Visit
Admission: RE | Admit: 2011-06-12 | Discharge: 2011-06-12 | Disposition: A | Discharge status: Home / Self Care | Payer: Medicare Other | Source: Ambulatory Visit | Attending: Radiation Oncology | Admitting: Radiation Oncology

## 2011-06-12 ENCOUNTER — Encounter: Payer: Self-pay | Admitting: Radiation Oncology

## 2011-06-12 VITALS — BP 168/86 | HR 68 | Temp 97.4°F | Resp 18 | Wt 131.8 lb

## 2011-06-12 DIAGNOSIS — C029 Malignant neoplasm of tongue, unspecified: Secondary | ICD-10-CM

## 2011-06-12 NOTE — Progress Notes (Signed)
Patient presents to the clinic today unaccompanied for a follow up appointment with Dr. Dayton Scrape. Patient is alert and oriented to person, place, and time. No distress noted. Steady gait noted. Pleasant affect noted. Patient denies pain at this time. Patient reports that food has no taste. Also, patient reports that she has difficulty swallowing meats. Patient reports that she had a thyroid function test in October or November 2012. Patient reports thyroid function test results were normal. Patient reports that she is smoking a pack of cigarettes a day. Patient states,"they (cigarettes) are the only thing left I can still enjoy." Patient reports her energy level is up and down. Patient reports that she does not have the finances for new dentures yet but hopes to get new ones by the end of this year. Patient denies any sores in her mouth or on her gums. Patient reports intermittent brief episodes of a vibrating sensation between her bilateral knees and hips. Patient reports she is concerned about a "spot Dr. Gaylyn Rong found on her lung."

## 2011-06-12 NOTE — Progress Notes (Signed)
Followup note: 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 She generally feels well. Her taste is improved along with her appetite. She does report occasional tingling involving both proximal lower extremities and pelvis. This is not related to physical activity or flexion of the neck. There is no radiation of tingling from the neck with flexion. Unfortunately, she smokes up to one pack of cigarettes a day. She found herself to be allergic to nicotine patches and she did not tolerate Chantix. Since I last saw her she had a followup CT scan of the chest on 03/01/2011 which showed a right upper lobe density. This was followed by a PET scan on 03/08/2011 and this was felt to represent scarring rather than metastatic disease or a lung primary. She saw Dr. Jenne Pane this past December and she'll see him again in April. She is scheduled to see Dr. Gaylyn Rong for a followup visit this March.  Physical examination: Nodes: There is no palpable lymphadenopathy in the neck. Oral cavity: She is edentulous. No masses or lesions appreciated. Oropharynx is unremarkable to inspection. On indirect mirror examination there is no evidence for recurrent disease along her tongue base. The endolarynx is unremarkable as well.  Impression: No evidence for recurrent disease. I encouraged her to stop smoking and to seek help through Dr. Juleen China. I'm not sure as to etiology of her tingling along her legs and pelvis. This does not appear to be related to her radiation therapy (Lhermitte's). She'll see Dr. Jenne Pane for a followup visit in April and Dr. Gaylyn Rong in March. I'll see her for a followup visit in 8 months, sometime in August of 2013.

## 2011-06-24 ENCOUNTER — Other Ambulatory Visit: Payer: Self-pay | Admitting: Oncology

## 2011-06-24 ENCOUNTER — Telehealth: Payer: Self-pay | Admitting: Oncology

## 2011-06-24 NOTE — Telephone Encounter (Signed)
S/w the pt and she is aware of her march 2013 appts for the lab,ct scan and md visit

## 2011-07-05 DIAGNOSIS — Q2112 Patent foramen ovale: Secondary | ICD-10-CM

## 2011-07-05 DIAGNOSIS — G459 Transient cerebral ischemic attack, unspecified: Secondary | ICD-10-CM

## 2011-07-05 DIAGNOSIS — Q211 Atrial septal defect: Secondary | ICD-10-CM

## 2011-07-05 HISTORY — DX: Transient cerebral ischemic attack, unspecified: G45.9

## 2011-07-05 HISTORY — DX: Patent foramen ovale: Q21.12

## 2011-07-05 HISTORY — DX: Atrial septal defect: Q21.1

## 2011-07-23 ENCOUNTER — Other Ambulatory Visit: Payer: Self-pay

## 2011-07-23 ENCOUNTER — Emergency Department (HOSPITAL_COMMUNITY): Payer: Medicare Other

## 2011-07-23 ENCOUNTER — Encounter (HOSPITAL_COMMUNITY): Payer: Self-pay

## 2011-07-23 ENCOUNTER — Observation Stay (HOSPITAL_COMMUNITY)
Admission: EM | Admit: 2011-07-23 | Discharge: 2011-07-26 | Disposition: A | Discharge status: Home / Self Care | Payer: Medicare Other | Attending: Internal Medicine | Admitting: Internal Medicine

## 2011-07-23 DIAGNOSIS — R739 Hyperglycemia, unspecified: Secondary | ICD-10-CM

## 2011-07-23 DIAGNOSIS — Z86718 Personal history of other venous thrombosis and embolism: Secondary | ICD-10-CM | POA: Insufficient documentation

## 2011-07-23 DIAGNOSIS — IMO0001 Reserved for inherently not codable concepts without codable children: Secondary | ICD-10-CM | POA: Insufficient documentation

## 2011-07-23 DIAGNOSIS — H269 Unspecified cataract: Secondary | ICD-10-CM | POA: Insufficient documentation

## 2011-07-23 DIAGNOSIS — Q2111 Secundum atrial septal defect: Secondary | ICD-10-CM | POA: Insufficient documentation

## 2011-07-23 DIAGNOSIS — E1165 Type 2 diabetes mellitus with hyperglycemia: Secondary | ICD-10-CM

## 2011-07-23 DIAGNOSIS — R4701 Aphasia: Secondary | ICD-10-CM | POA: Insufficient documentation

## 2011-07-23 DIAGNOSIS — F17200 Nicotine dependence, unspecified, uncomplicated: Secondary | ICD-10-CM | POA: Insufficient documentation

## 2011-07-23 DIAGNOSIS — C029 Malignant neoplasm of tongue, unspecified: Secondary | ICD-10-CM

## 2011-07-23 DIAGNOSIS — R29898 Other symptoms and signs involving the musculoskeletal system: Secondary | ICD-10-CM | POA: Insufficient documentation

## 2011-07-23 DIAGNOSIS — G459 Transient cerebral ischemic attack, unspecified: Principal | ICD-10-CM | POA: Diagnosis present

## 2011-07-23 DIAGNOSIS — Q211 Atrial septal defect: Secondary | ICD-10-CM | POA: Insufficient documentation

## 2011-07-23 DIAGNOSIS — I252 Old myocardial infarction: Secondary | ICD-10-CM | POA: Insufficient documentation

## 2011-07-23 DIAGNOSIS — I2510 Atherosclerotic heart disease of native coronary artery without angina pectoris: Secondary | ICD-10-CM | POA: Insufficient documentation

## 2011-07-23 DIAGNOSIS — R5383 Other fatigue: Secondary | ICD-10-CM | POA: Insufficient documentation

## 2011-07-23 DIAGNOSIS — IMO0002 Reserved for concepts with insufficient information to code with codable children: Secondary | ICD-10-CM | POA: Diagnosis present

## 2011-07-23 DIAGNOSIS — R42 Dizziness and giddiness: Secondary | ICD-10-CM | POA: Insufficient documentation

## 2011-07-23 DIAGNOSIS — R5381 Other malaise: Secondary | ICD-10-CM | POA: Insufficient documentation

## 2011-07-23 DIAGNOSIS — I69998 Other sequelae following unspecified cerebrovascular disease: Secondary | ICD-10-CM | POA: Insufficient documentation

## 2011-07-23 DIAGNOSIS — Z85810 Personal history of malignant neoplasm of tongue: Secondary | ICD-10-CM | POA: Insufficient documentation

## 2011-07-23 DIAGNOSIS — Z9861 Coronary angioplasty status: Secondary | ICD-10-CM | POA: Insufficient documentation

## 2011-07-23 DIAGNOSIS — Z720 Tobacco use: Secondary | ICD-10-CM | POA: Diagnosis present

## 2011-07-23 DIAGNOSIS — Z8673 Personal history of transient ischemic attack (TIA), and cerebral infarction without residual deficits: Secondary | ICD-10-CM

## 2011-07-23 DIAGNOSIS — E785 Hyperlipidemia, unspecified: Secondary | ICD-10-CM | POA: Diagnosis present

## 2011-07-23 LAB — CBC
MCH: 31.6 pg (ref 26.0–34.0)
MCHC: 35.2 g/dL (ref 30.0–36.0)
MCV: 89.9 fL (ref 78.0–100.0)
Platelets: 166 10*3/uL (ref 150–400)
RBC: 4.84 MIL/uL (ref 3.87–5.11)
RDW: 13.9 % (ref 11.5–15.5)

## 2011-07-23 LAB — DIFFERENTIAL
Basophils Relative: 1 % (ref 0–1)
Eosinophils Absolute: 0.1 10*3/uL (ref 0.0–0.7)
Eosinophils Relative: 2 % (ref 0–5)
Lymphs Abs: 0.8 10*3/uL (ref 0.7–4.0)
Neutrophils Relative %: 71 % (ref 43–77)

## 2011-07-23 LAB — PROTIME-INR
INR: 0.92 (ref 0.00–1.49)
Prothrombin Time: 12.6 seconds (ref 11.6–15.2)

## 2011-07-23 LAB — TROPONIN I: Troponin I: <0.3 ng/mL (ref <0.30)

## 2011-07-23 LAB — COMPREHENSIVE METABOLIC PANEL
ALT: 15 U/L (ref 0–35)
Albumin: 3.6 g/dL (ref 3.5–5.2)
Calcium: 9.9 mg/dL (ref 8.4–10.5)
GFR calc Af Amer: 88 mL/min — ABNORMAL LOW (ref >90)
Glucose, Bld: 269 mg/dL — ABNORMAL HIGH (ref 70–99)
Potassium: 3.9 mEq/L (ref 3.5–5.1)
Sodium: 134 mEq/L — ABNORMAL LOW (ref 135–145)
Total Protein: 6.8 g/dL (ref 6.0–8.3)

## 2011-07-23 LAB — GLUCOSE, CAPILLARY: Glucose-Capillary: 266 mg/dL — ABNORMAL HIGH (ref 70–99)

## 2011-07-23 LAB — CK TOTAL AND CKMB (NOT AT ARMC)
Relative Index: INVALID (ref 0.0–2.5)
Total CK: 19 U/L (ref 7–177)

## 2011-07-23 MED ORDER — CLOPIDOGREL BISULFATE 75 MG PO TABS
75.0000 mg | ORAL_TABLET | Freq: Every day | ORAL | Status: DC
  Administered 2011-07-23 – 2011-07-25 (×3): 75 mg via ORAL
  Filled 2011-07-23 (×4): qty 1

## 2011-07-23 MED ORDER — METFORMIN HCL 500 MG PO TABS
1000.0000 mg | ORAL_TABLET | Freq: Two times a day (BID) | ORAL | Status: DC
  Administered 2011-07-24 – 2011-07-25 (×3): 1000 mg via ORAL
  Filled 2011-07-23 (×5): qty 2

## 2011-07-23 MED ORDER — ASPIRIN 325 MG PO TABS
325.0000 mg | ORAL_TABLET | Freq: Every day | ORAL | Status: DC
  Administered 2011-07-24 – 2011-07-25 (×2): 325 mg via ORAL
  Filled 2011-07-23 (×3): qty 1

## 2011-07-23 MED ORDER — INSULIN GLULISINE 100 UNIT/ML ~~LOC~~ SOLN: 4.0000 [IU] | Freq: Three times a day (TID) | SUBCUTANEOUS | Status: DC | PRN

## 2011-07-23 MED ORDER — PANTOPRAZOLE SODIUM 40 MG PO TBEC
40.0000 mg | DELAYED_RELEASE_TABLET | Freq: Every day | ORAL | Status: DC
  Administered 2011-07-23 – 2011-07-25 (×3): 40 mg via ORAL
  Filled 2011-07-23 (×3): qty 1

## 2011-07-23 MED ORDER — NICOTINE 21 MG/24HR TD PT24: 21.0000 mg | MEDICATED_PATCH | Freq: Every day | TRANSDERMAL | Status: DC

## 2011-07-23 MED ORDER — OMEGA-3-ACID ETHYL ESTERS 1 G PO CAPS
2.0000 g | ORAL_CAPSULE | Freq: Every day | ORAL | Status: DC
  Administered 2011-07-23 – 2011-07-25 (×3): 2 g via ORAL
  Filled 2011-07-23 (×4): qty 2

## 2011-07-23 MED ORDER — INSULIN ASPART 100 UNIT/ML ~~LOC~~ SOLN
4.0000 [IU] | Freq: Three times a day (TID) | SUBCUTANEOUS | Status: DC | PRN
  Filled 2011-07-23: qty 3

## 2011-07-23 MED ORDER — CALCIUM CARBONATE 1250 (500 CA) MG PO TABS
1.0000 | ORAL_TABLET | Freq: Two times a day (BID) | ORAL | Status: DC
  Administered 2011-07-24 – 2011-07-25 (×4): 500 mg via ORAL
  Administered 2011-07-26: 1 mg via ORAL
  Filled 2011-07-23 (×7): qty 1

## 2011-07-23 MED ORDER — ASPIRIN 81 MG PO TABS: 81.0000 mg | ORAL_TABLET | Freq: Every day | ORAL | Status: DC

## 2011-07-23 MED ORDER — ACETAMINOPHEN 325 MG PO TABS
650.0000 mg | ORAL_TABLET | Freq: Four times a day (QID) | ORAL | Status: DC | PRN
  Administered 2011-07-24: 650 mg via ORAL
  Filled 2011-07-23: qty 2

## 2011-07-23 MED ORDER — INSULIN GLARGINE 100 UNIT/ML ~~LOC~~ SOLN
5.0000 [IU] | Freq: Every day | SUBCUTANEOUS | Status: DC
  Administered 2011-07-24: 5 [IU] via SUBCUTANEOUS
  Filled 2011-07-23: qty 3

## 2011-07-23 MED ORDER — ROSUVASTATIN CALCIUM 10 MG PO TABS
10.0000 mg | ORAL_TABLET | Freq: Every day | ORAL | Status: DC
  Administered 2011-07-23 – 2011-07-25 (×3): 10 mg via ORAL
  Filled 2011-07-23 (×3): qty 1

## 2011-07-23 MED ORDER — VITAMIN D3 25 MCG (1000 UNIT) PO TABS
1000.0000 [IU] | ORAL_TABLET | Freq: Every day | ORAL | Status: DC
  Administered 2011-07-23 – 2011-07-25 (×3): 1000 [IU] via ORAL
  Filled 2011-07-23 (×4): qty 1

## 2011-07-23 MED ORDER — GADOBENATE DIMEGLUMINE 529 MG/ML IV SOLN
13.0000 mL | Freq: Once | INTRAVENOUS | Status: AC | PRN
  Administered 2011-07-23: 13 mL via INTRAVENOUS

## 2011-07-23 NOTE — ED Provider Notes (Signed)
History     CSN: [id number]  Arrival date & time 07/23/11  1815   First MD Initiated Contact with Patient 07/23/11 1843      Chief Complaint  Patient presents with  . Weakness    (Consider location/radiation/quality/duration/timing/severity/associated sxs/prior treatment) HPI Brenda [name] is a 61 y.o. female who was at home today, when she suddenly began to have trouble speaking, and writing. This reminded her of when she had a stroke, so she came immediately to the emergency room. The symptoms resolved within 15 minutes, by the time. She got to the emergency room. Earlier today she had dizziness that was intermittent and associated with standing. She was also seen today by her opthalmologist for dilation and told that her bilateral cataracts were getting worse. The patient has had some trouble with blurry vision for several weeks, and occasional double vision. She denies headache, nausea, vomiting, weakness, trouble walking or chest pain. Patient did not try the medication for the problem today. She is currently at her baseline.      Past Medical History  Diagnosis Date  . Diabetes mellitus   . Cancer of base of tongue   . History of DVT (deep vein thrombosis)   . Myocardial infarction   . Coronary artery disease   . Stroke 2000  . History of pneumonia   . History of radiation therapy 03/06/2009    right base of tongue primary and right neck node 7000 cGy 35 sessions    Past Surgical History  Procedure Date  . Coronary stent placement   . Vascular bypass surgery     lower extremities  . Appendectomy     Family History  Problem Relation Age of Onset  . Cancer Mother     pancreatic  . Cancer Father     throat  . Cancer Sister     breast    History  Substance Use Topics  . Smoking status: Current Everyday Smoker -- 1.0 packs/day for 47 years    Types: Cigarettes  . Smokeless tobacco: Never Used  . Alcohol Use: Yes     socially    OB History    Grav Para  Term Preterm Abortions TAB SAB Ect Mult Living                  Review of Systems  All other systems reviewed and are negative.    Allergies  Tape  Home Medications   Current Outpatient Rx  Name Route Sig Dispense Refill  . ACETAMINOPHEN 325 MG PO TABS Oral Take 650 mg by mouth every 6 (six) hours as needed. For pain    . ASPIRIN 81 MG PO TABS Oral Take 81 mg by mouth daily.      Marland Kitchen CALCIUM CARBONATE 600 MG PO TABS Oral Take 600 mg by mouth 2 (two) times daily with a meal.      . VITAMIN D 1000 UNITS PO TABS Oral Take 1,000 Units by mouth daily.      Marland Kitchen CLOPIDOGREL BISULFATE 75 MG PO TABS Oral Take 75 mg by mouth daily.      . OMEGA-3 FATTY ACIDS 1000 MG PO CAPS Oral Take 2 g by mouth daily.     . INSULIN GLARGINE 100 UNIT/ML [place] SOLN Subcutaneous Inject 5 Units into the skin at bedtime.    . INSULIN GLULISINE 100 UNIT/ML [place] SOLN Subcutaneous Inject 4 Units into the skin 3 (three) times daily as needed. For blood sugar levels    .  METFORMIN HCL 500 MG PO TABS Oral Take 1,000 mg by mouth 2 (two) times daily with a meal.     . OMEPRAZOLE 20 MG PO CPDR Oral Take 20 mg by mouth daily.      Marland Kitchen ROSUVASTATIN CALCIUM 10 MG PO TABS Oral Take 10 mg by mouth daily.        BP 132/62  Pulse 64  Temp(Src) 98.6 F (37 C) (Oral)  Resp 18  SpO2 96%  Physical Exam  Nursing note and vitals reviewed. Constitutional: She is oriented to person, place, and time. She appears well-developed and well-nourished.  HENT:  Head: Normocephalic and atraumatic.  Eyes: Conjunctivae and EOM are normal. Pupils are equal, round, and reactive to light.  Neck: Normal range of motion and phonation normal. Neck supple.  Cardiovascular: Normal rate, regular rhythm and intact distal pulses.   Pulmonary/Chest: Effort normal. No respiratory distress. She has no rales. She exhibits no tenderness.       Decreased breath sounds bilateral with occasional wheezes bilaterally  Abdominal: Soft. She exhibits no distension.  There is no tenderness. There is no guarding.  Musculoskeletal: Normal range of motion.  Neurological: She is alert and oriented to person, place, and time. She has normal strength. She exhibits normal muscle tone.       No dysarthria or focal abnormalities  Skin: Skin is warm and dry.  Psychiatric: She has a normal mood and affect. Her behavior is normal. Judgment and thought content normal.    ED Course  Procedures (including critical care time) Patient presented to the emergency department as a code stroke. She was seen immediately by neurology, who called it off. He has written a note with suggestions for further evaluation. He will follow as a consult.     Labs Reviewed  CBC - Abnormal; Notable for the following:    Hemoglobin 15.3 (*)    All other components within normal limits  COMPREHENSIVE METABOLIC PANEL - Abnormal; Notable for the following:    Sodium 134 (*)    Glucose, Bld 269 (*)    Alkaline Phosphatase 133 (*)    GFR calc non Af Amer 76 (*)    GFR calc Af Amer 88 (*)    All other components within normal limits  GLUCOSE, CAPILLARY - Abnormal; Notable for the following:    Glucose-Capillary 266 (*)    All other components within normal limits  PROTIME-INR  APTT  DIFFERENTIAL  CK TOTAL AND CKMB  TROPONIN I   No results found.   1. TIA (transient ischemic attack)   2. Hyperglycemia       MDM  TIA with elevated risk factors and prior stroke. She is to be admitted for risk stratification and further evaluation        Flint Melter, MD 07/23/11 2040

## 2011-07-23 NOTE — ED Notes (Signed)
3012-01 Ready

## 2011-07-23 NOTE — ED Notes (Signed)
Pt. Reports that when she went to bed last night she felt fine but when she woke up her rt. Side of her head was feeling funny.  About 30 minutes   Ago she reports that she was unable to speak  Or write, Presently pt. s speech is clear no lt.side weakness present, but pt. Reports that is from her old stroke.  No new deficits noted.

## 2011-07-23 NOTE — Consult Note (Signed)
Reason for Consult: "could not talk"  HPI: Brenda [name] is an 61 y.o. Female who complains of an episode of inability to talk. She was not feeling well the whole day today and experienced severe lightheadedness with inability to talk. She also could not write. The episode last about 20 to 30 minutes and resolved in the ambulance. 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 Patient complains of having nasal congestion and being off balance the entire day. She smokes one pack a day for many years. Has multiple stents in her heart and has diabetes. Initially stroke code was called, but cancelled before we arrived to the ED. Time of onset: 5:50 pm. No t-PA due to NIHSS of 0. Ranking score of 1.   Past Medical History  Diagnosis Date  . Diabetes mellitus   . Cancer of base of tongue   . History of DVT (deep vein thrombosis)   . Myocardial infarction   . Coronary artery disease   . Stroke 2000  . History of pneumonia   . History of radiation therapy 03/06/2009    right base of tongue primary and right neck node 7000 cGy 35 sessions   Medications: I have reviewed the patient's current medications.  Past Surgical History  Procedure Date  . Coronary stent placement   . Vascular bypass surgery     lower extremities  . Appendectomy    Family History  Problem Relation Age of Onset  . Cancer Mother     pancreatic  . Cancer Father     throat  . Cancer Sister     breast   Social History:  reports that she has been smoking Cigarettes.  She has a 47 pack-year smoking history. She has never used smokeless tobacco. She reports that she drinks alcohol. She reports that she does not use illicit drugs.  Allergies:  Allergies  Allergen Reactions  . Tape Hives   ROS: as above  There were no vitals taken for this visit.  Neurological exam: AAO*3. No aphasia.  Was able to tell me months of the year forwards and backwards correctly, exhibiting good attention span.  Recall was 3 of 3 after 5 minutes. Followed complex commands. Cranial nerves: EOMI, PERRL. Visual fields were full. Sensation to V1 through V3 areas of the face was intact and symmetric throughout. There was no facial asymmetry. Hearing to finger rub was equal and symmetrical bilaterally. Shoulder shrug was 5/5 and symmetric bilaterally. Head rotation was 5/5 bilaterally. There was no dysarthria or palatal deviation. Motor: strength was 5/5 and symmetric throughout except proximal L IP with 4+/5 strength. Sensory: was intact throughout to light touch, pinprick. Coordination: finger-to-nose were intact and symmetric bilaterally. Reflexes: were 1+ in upper extremities and trace at the knees and trace at the ankles. Plantar response was downgoing bilaterally. Gait: Romberg test was positive. Patient was initially a bit unsteady on standing up, but there was no ataxia noted with gait.   Assessment/Plan: 61 years old woman who is a vasculopath with multiple vascular risk factors that include HLD, MI, prior CVA, smoking who comes in with transient expressive aphasia and severe lightheadedness - she still feels a bit off-balance, but that has been present throughout the day. At this time given her vascular risk factors, she most likely had a TIA although a stroke is still possible and needs to be ruled out.  1) CT head  2) MRI brain/MRA head/neck - would try to obtain tonight and  treat her elevated BP if no new CVA 3) Cardiac monitoring, Echo, Holter 4) HgA1C, Lipid Profile 5) Smoking Cessation 6) Neurochecks q4h 7) HOB at 30 degrees 8) PT/OT/Speech eval 9) Cont. Plavix 10) Orthostatics  Analyn Matusek 07/23/2011, 6:48 PM

## 2011-07-23 NOTE — ED Notes (Signed)
Code Stroke cancelled per RN,Lori,. [facility] called.

## 2011-07-23 NOTE — ED Notes (Signed)
Called [facility] TO [facility] CODE STROKE

## 2011-07-23 NOTE — H&P (Signed)
PCP:   Michiel Sites, MD, MD  Consultants:[facility], neuro hospitalists  Chief Complaint:  Unable to talk or write  HPI: Patient is a 61 year old white female with past mental history hyperlipidemia, diabetes mellitus, tobacco abuse and previous CVA with left-sided residual weakness who today in suddenly found herself in the late morning unable to speak or write.  She did not lose consciousness and was able to understand what was going on and communicated which was going through to her husband. The immediately got her in the car and headed over to the emergency room. On route, the patient's symptoms seemed to slowly resolve and by the time that she arrived at the emergency room they had completely resolved. Given the patient's symptoms, this is concerning for a code stroke. No TPA was needed because of resolution of symptoms. CT scan of the head was done which noted only her old CVA. MRI/MRA was done and results are currently pending.  Review of Systems:  When I saw the patient emergency room, she was doing okay. Her biggest complaint was of feeling very weak overall secondary to fatigue. Her symptoms of aphasia and focal weakness to be completely resolved. She denied any headaches, vision changes, dysphasia, chest pain, palpitations, shortness of breath, wheeze, cough, abdominal pain, hematuria, dysuria, constipation, diarrhea, focal extremity numbness or pain. She has chronic left-sided weakness from her previous CVA upper greater than lower. Otherwise review systems is negative.  Past Medical History: Past Medical History  Diagnosis Date  . Diabetes mellitus   . Cancer of base of tongue   . History of DVT (deep vein thrombosis)   . Myocardial infarction   . Coronary artery disease   . Stroke 2000  . History of pneumonia   . History of radiation therapy 03/06/2009    right base of tongue primary and right neck node 7000 cGy 35 sessions   Past Surgical History  Procedure Date  .  Coronary stent placement   . Vascular bypass surgery     lower extremities  . Appendectomy     Medications: Prior to Admission medications   Medication Sig Start Date End Date Taking? Authorizing Provider  acetaminophen (TYLENOL) 325 MG tablet Take 650 mg by mouth every 6 (six) hours as needed. For pain   Yes Historical Provider, MD  aspirin 81 MG tablet Take 81 mg by mouth daily.     Yes Historical Provider, MD  calcium carbonate (OS-CAL) 600 MG TABS Take 600 mg by mouth 2 (two) times daily with a meal.     Yes Historical Provider, MD  cholecalciferol (VITAMIN D) 1000 UNITS tablet Take 1,000 Units by mouth daily.     Yes Historical Provider, MD  clopidogrel (PLAVIX) 75 MG tablet Take 75 mg by mouth daily.     Yes Historical Provider, MD  fish oil-omega-3 fatty acids 1000 MG capsule Take 2 g by mouth daily.    Yes Historical Provider, MD  insulin glargine (LANTUS) 100 UNIT/ML injection Inject 5 Units into the skin at bedtime.   Yes Historical Provider, MD  insulin glulisine (APIDRA) 100 UNIT/ML injection Inject 4 Units into the skin 3 (three) times daily as needed. For blood sugar levels   Yes Historical Provider, MD  metFORMIN (GLUCOPHAGE) 500 MG tablet Take 1,000 mg by mouth 2 (two) times daily with a meal.    Yes Historical Provider, MD  omeprazole (PRILOSEC) 20 MG capsule Take 20 mg by mouth daily.     Yes Historical Provider, MD  rosuvastatin (  CRESTOR) 10 MG tablet Take 10 mg by mouth daily.     Yes Historical Provider, MD    Allergies:   Allergies  Allergen Reactions  . Tape Hives    Social History:  reports that she has been smoking Cigarettes.  She has a 47 pack-year smoking history. She has never used smokeless tobacco. She reports that she drinks alcohol. She reports that she does not use illicit drugs. The patient is normally at baseline able to participate in full activities of daily living without any assistance despite her left-sided weakness. She lives at home with her  husband.  Family History: Family History  Problem Relation Age of Onset  . Cancer Mother     pancreatic  . Cancer Father     throat  . Cancer Sister     breast    Physical Exam: Filed Vitals:   07/23/11 1854 07/23/11 1900 07/23/11 2000  BP: 156/89 132/62 139/58  Pulse:  64 62  Temp: 98.6 F (37 C)    TempSrc: Oral    Resp: 17 18 20   SpO2: 98% 96% 94%   General: Alert and oriented x3, in no acute distress, looks older than stated age, fatigued HEENT: Normocephalic, atraumatic, mucous membranes are slightly dry, cranial nerves II through XII are intact Cardiovascular: Regular rate and rhythm S1-S2 Lungs: Clear to auscultation Bilaterally Abdomen: Soft, nontender, nondistended, positive bowel sounds Extremities: No clubbing or cyanosis or edema Musculoskeletal: Patient has some deficits in terms of left R. compared to right upper extremity in terms of grip/flexion/extension in that on the left side she's about a 4+/5 as compared to the right which is a 5/5. In regards to the lower extremities the patient has some deficits on the left side in terms of flexion and extension being about a 5-/5 as opposed to a 5/5 on the right. Neurological: No focal deficits acutely. No dysmetria. Negative for Babinski sign. She does have some difficulty with fine and gross motor control including finger to nose on the left-hand side from her old stroke.   Labs on Admission:   [facility] 07/23/11 1905  NA 134*  K 3.9  CL 97  CO2 30  GLUCOSE 269*  BUN 10  CREATININE 0.82  CALCIUM 9.9  MG --  PHOS --    Basename 07/23/11 1905  AST 11  ALT 15  ALKPHOS 133*  BILITOT 0.4  PROT 6.8  ALBUMIN 3.6    Basename 07/23/11 1905  WBC 4.6  NEUTROABS 3.3  HGB 15.3*  HCT 43.5  MCV 89.9  PLT 166    Basename 07/23/11 1905  CKTOTAL 19  CKMB 1.6  CKMBINDEX --  TROPONINI <0.30    Radiological Exams on Admission: No results found.  Assessment/Plan Present on Admission:  .DM (diabetes  mellitus), type 2, uncontrolled: Continue home medications. We'll check a hemoglobin A1c.  Marland KitchenTIA (transient ischemic attack): Workup in progress. Awaiting MRI/MRA results. Will check 2-D echo plus Doppler is plus fasting lipid profile. Patient already on aspirin and Plavix. I suspect the cause will be likely small vessel disease secondary to diabetes and/or lipids and/or tobacco.  .Tobacco abuse: Counseled. Patient declined nicotine patch, because it causes an allergy reaction to her skin.  Marland KitchenHyperlipemia: Checking fasting lipid profile. Patient already on Crestor.   After discussion with the patient, she is to be a full code.  We will respect these wishes.  I anticipate her length of stay to be 1-2 days based on history, resolution of symptoms and physical  exam, unless something should change.  Time spent on this patient including examination and decision-making process: 40 minutes.  Hollice Espy [phone] 07/23/2011, 10:27 PM

## 2011-07-24 LAB — LIPID PANEL
Cholesterol: 183 mg/dL (ref 0–200)
LDL Cholesterol: 98 mg/dL (ref 0–99)
Total CHOL/HDL Ratio: 3.5 RATIO
VLDL: 32 mg/dL (ref 0–40)

## 2011-07-24 LAB — GLUCOSE, CAPILLARY
Glucose-Capillary: 229 mg/dL — ABNORMAL HIGH (ref 70–99)
Glucose-Capillary: 241 mg/dL — ABNORMAL HIGH (ref 70–99)
Glucose-Capillary: 232 mg/dL — ABNORMAL HIGH (ref 70–99)

## 2011-07-24 LAB — RAPID URINE DRUG SCREEN, HOSP PERFORMED
Barbiturates: NOT DETECTED
Benzodiazepines: NOT DETECTED
Cocaine: NOT DETECTED

## 2011-07-24 MED ORDER — INSULIN GLARGINE 100 UNIT/ML ~~LOC~~ SOLN
10.0000 [IU] | Freq: Every day | SUBCUTANEOUS | Status: DC
  Administered 2011-07-24 – 2011-07-25 (×2): 10 [IU] via SUBCUTANEOUS

## 2011-07-24 MED ORDER — INSULIN ASPART 100 UNIT/ML ~~LOC~~ SOLN
0.0000 [IU] | SUBCUTANEOUS | Status: DC
  Administered 2011-07-24: 2 [IU] via SUBCUTANEOUS
  Administered 2011-07-24 (×2): 3 [IU] via SUBCUTANEOUS
  Administered 2011-07-25 (×2): 2 [IU] via SUBCUTANEOUS
  Administered 2011-07-25 – 2011-07-26 (×2): 3 [IU] via SUBCUTANEOUS
  Administered 2011-07-26: 1 [IU] via SUBCUTANEOUS
  Filled 2011-07-24: qty 3

## 2011-07-24 NOTE — Consult Note (Signed)
[facility]       CONSULTATION NOTE   Reason for Consult: TIA  Requesting Physician: Dr. Pearlean Brownie  Cardiologist: Dr. [name]  HPI: This is a 61 y.o. female with a past medical history significant for CAD s/p numerous PCI's in the past (last in 2007 by Dr. Aleen Campi), hyperlipidemia, diabetes mellitus, tobacco abuse and previous CVA with left-sided residual weakness who today in suddenly found herself in the late morning unable to speak or write. She did not lose consciousness and was able to understand what was going on and communicated which was going through to her husband. The immediately got her in the car and headed over to the emergency room. On route, the patient's symptoms seemed to slowly resolve and by the time that she arrived at the emergency room they had completely resolved. Given the patient's symptoms, this is concerning for a code stroke. No TPA was needed because of resolution of symptoms. CT scan of the head was done which noted only her old CVA. MRI/MRA does not show any new findings.  Review of telemetry does not indicate any atrial fibrillation since she has been hospitalized.  I was asked to consult for TEE.  PMHx:  Past Medical History  Diagnosis Date  . Diabetes mellitus   . Cancer of base of tongue   . History of DVT (deep vein thrombosis)   . Myocardial infarction   . Coronary artery disease   . Stroke 2000  . History of pneumonia   . History of radiation therapy 03/06/2009    right base of tongue primary and right neck node 7000 cGy 35 sessions   Past Surgical History  Procedure Date  . Coronary stent placement   . Vascular bypass surgery     lower extremities  . Appendectomy     FAMHx: Family History  Problem Relation Age of Onset  . Cancer Mother     pancreatic  . Cancer Father     throat  . Cancer Sister     breast    SOCHx:  reports that she has been smoking Cigarettes.  She has a 47 pack-year smoking  history. She has never used smokeless tobacco. She reports that she drinks alcohol. She reports that she does not use illicit drugs.  ALLERGIES: Allergies  Allergen Reactions  . Tape Hives    ROS: A comprehensive review of systems was negative except for: Constitutional: positive for malaise Cardiovascular: positive for history of CAD Neurological: positive for speech problems  HOME MEDICATIONS: Prescriptions prior to admission  Medication Sig Dispense Refill  . acetaminophen (TYLENOL) 325 MG tablet Take 650 mg by mouth every 6 (six) hours as needed. For pain      . aspirin 81 MG tablet Take 81 mg by mouth daily.        . calcium carbonate (OS-CAL) 600 MG TABS Take 600 mg by mouth 2 (two) times daily with a meal.        . cholecalciferol (VITAMIN D) 1000 UNITS tablet Take 1,000 Units by mouth daily.        . clopidogrel (PLAVIX) 75 MG tablet Take 75 mg by mouth daily.        . fish oil-omega-3 fatty acids 1000 MG capsule Take 2 g by mouth daily.       . insulin glargine (LANTUS) 100 UNIT/ML injection Inject 5 Units into the skin at bedtime.      . insulin glulisine (APIDRA) 100 UNIT/ML  injection Inject 4 Units into the skin 3 (three) times daily as needed. For blood sugar levels      . metFORMIN (GLUCOPHAGE) 500 MG tablet Take 1,000 mg by mouth 2 (two) times daily with a meal.       . omeprazole (PRILOSEC) 20 MG capsule Take 20 mg by mouth daily.        . rosuvastatin (CRESTOR) 10 MG tablet Take 10 mg by mouth daily.          HOSPITAL MEDICATIONS: Prior to Admission:  Prescriptions prior to admission  Medication Sig Dispense Refill  . acetaminophen (TYLENOL) 325 MG tablet Take 650 mg by mouth every 6 (six) hours as needed. For pain      . aspirin 81 MG tablet Take 81 mg by mouth daily.        . calcium carbonate (OS-CAL) 600 MG TABS Take 600 mg by mouth 2 (two) times daily with a meal.        . cholecalciferol (VITAMIN D) 1000 UNITS tablet Take 1,000 Units by mouth daily.          . clopidogrel (PLAVIX) 75 MG tablet Take 75 mg by mouth daily.        . fish oil-omega-3 fatty acids 1000 MG capsule Take 2 g by mouth daily.       . insulin glargine (LANTUS) 100 UNIT/ML injection Inject 5 Units into the skin at bedtime.      . insulin glulisine (APIDRA) 100 UNIT/ML injection Inject 4 Units into the skin 3 (three) times daily as needed. For blood sugar levels      . metFORMIN (GLUCOPHAGE) 500 MG tablet Take 1,000 mg by mouth 2 (two) times daily with a meal.       . omeprazole (PRILOSEC) 20 MG capsule Take 20 mg by mouth daily.        . rosuvastatin (CRESTOR) 10 MG tablet Take 10 mg by mouth daily.          VITALS: Blood pressure 125/75, pulse 62, temperature 97.7 F (36.5 C), temperature source Oral, resp. rate 18, height 5\' 5"  (1.651 m), weight 58.968 kg (130 lb), SpO2 91.00%.  PHYSICAL EXAM: General appearance: alert and no distress Neck: no adenopathy, no carotid bruit, no JVD, supple, symmetrical, trachea midline and thyroid not enlarged, symmetric, no tenderness/mass/nodules Lungs: clear to auscultation bilaterally Heart: regular rate and rhythm, S1, S2 normal, no murmur, click, rub or gallop Abdomen: soft, non-tender; bowel sounds normal; no masses,  no organomegaly Extremities: extremities normal, atraumatic, no cyanosis or edema Pulses: 2+ and symmetric Skin: Skin color, texture, turgor normal. No rashes or lesions Neurologic: Grossly normal  LABS: Results for orders placed during the hospital encounter of 07/23/11 (from the past 48 hour(s))  GLUCOSE, CAPILLARY     Status: Abnormal   Collection Time   07/23/11  6:55 PM      Component Value Range Comment   Glucose-Capillary 266 (*) 70 - 99 (mg/dL)   PROTIME-INR     Status: Normal   Collection Time   07/23/11  7:05 PM      Component Value Range Comment   Prothrombin Time 12.6  11.6 - 15.2 (seconds)    INR 0.92  0.00 - 1.49    APTT     Status: Normal   Collection Time   07/23/11  7:05 PM      Component  Value Range Comment   aPTT 34  24 - 37 (seconds)   CBC  Status: Abnormal   Collection Time   07/23/11  7:05 PM      Component Value Range Comment   WBC 4.6  4.0 - 10.5 (K/uL)    RBC 4.84  3.87 - 5.11 (MIL/uL)    Hemoglobin 15.3 (*) 12.0 - 15.0 (g/dL)    HCT 14.7  82.9 - 56.2 (%)    MCV 89.9  78.0 - 100.0 (fL)    MCH 31.6  26.0 - 34.0 (pg)    MCHC 35.2  30.0 - 36.0 (g/dL)    RDW 13.0  86.5 - 78.4 (%)    Platelets 166  150 - 400 (K/uL)   DIFFERENTIAL     Status: Normal   Collection Time   07/23/11  7:05 PM      Component Value Range Comment   Neutrophils Relative 71  43 - 77 (%)    Neutro Abs 3.3  1.7 - 7.7 (K/uL)    Lymphocytes Relative 18  12 - 46 (%)    Lymphs Abs 0.8  0.7 - 4.0 (K/uL)    Monocytes Relative 7  3 - 12 (%)    Monocytes Absolute 0.3  0.1 - 1.0 (K/uL)    Eosinophils Relative 2  0 - 5 (%)    Eosinophils Absolute 0.1  0.0 - 0.7 (K/uL)    Basophils Relative 1  0 - 1 (%)    Basophils Absolute 0.1  0.0 - 0.1 (K/uL)   COMPREHENSIVE METABOLIC PANEL     Status: Abnormal   Collection Time   07/23/11  7:05 PM      Component Value Range Comment   Sodium 134 (*) 135 - 145 (mEq/L)    Potassium 3.9  3.5 - 5.1 (mEq/L)    Chloride 97  96 - 112 (mEq/L)    CO2 30  19 - 32 (mEq/L)    Glucose, Bld 269 (*) 70 - 99 (mg/dL)    BUN 10  6 - 23 (mg/dL)    Creatinine, Ser 6.96  0.50 - 1.10 (mg/dL)    Calcium 9.9  8.4 - 10.5 (mg/dL)    Total Protein 6.8  6.0 - 8.3 (g/dL)    Albumin 3.6  3.5 - 5.2 (g/dL)    AST 11  0 - 37 (U/L)    ALT 15  0 - 35 (U/L)    Alkaline Phosphatase 133 (*) 39 - 117 (U/L)    Total Bilirubin 0.4  0.3 - 1.2 (mg/dL)    GFR calc non Af Amer 76 (*) >90 (mL/min)    GFR calc Af Amer 88 (*) >90 (mL/min)   CK TOTAL AND CKMB     Status: Normal   Collection Time   07/23/11  7:05 PM      Component Value Range Comment   Total CK 19  7 - 177 (U/L)    CK, MB 1.6  0.3 - 4.0 (ng/mL)    Relative Index RELATIVE INDEX IS INVALID  0.0 - 2.5    TROPONIN I     Status:  Normal   Collection Time   07/23/11  7:05 PM      Component Value Range Comment   Troponin I <0.30  <0.30 (ng/mL)   GLUCOSE, CAPILLARY     Status: Abnormal   Collection Time   07/23/11 11:19 PM      Component Value Range Comment   Glucose-Capillary 229 (*) 70 - 99 (mg/dL)   URINE RAPID DRUG SCREEN (HOSP PERFORMED)     Status: Normal  Collection Time   07/24/11  4:45 AM      Component Value Range Comment   Opiates NONE DETECTED  NONE DETECTED     Cocaine NONE DETECTED  NONE DETECTED     Benzodiazepines NONE DETECTED  NONE DETECTED     Amphetamines NONE DETECTED  NONE DETECTED     Tetrahydrocannabinol NONE DETECTED  NONE DETECTED     Barbiturates NONE DETECTED  NONE DETECTED    LIPID PANEL     Status: Abnormal   Collection Time   07/24/11  6:04 AM      Component Value Range Comment   Cholesterol 183  0 - 200 (mg/dL)    Triglycerides 409 (*) <150 (mg/dL)    HDL 53  >81 (mg/dL)    Total CHOL/HDL Ratio 3.5      VLDL 32  0 - 40 (mg/dL)    LDL Cholesterol 98  0 - 99 (mg/dL)   GLUCOSE, CAPILLARY     Status: Abnormal   Collection Time   07/24/11  6:39 AM      Component Value Range Comment   Glucose-Capillary 174 (*) 70 - 99 (mg/dL)   GLUCOSE, CAPILLARY     Status: Abnormal   Collection Time   07/24/11 11:39 AM      Component Value Range Comment   Glucose-Capillary 241 (*) 70 - 99 (mg/dL)     IMAGING: Mr Maxine Glenn Head Wo Contrast  07/24/2011  *RADIOLOGY REPORT*  Clinical Data:  Episode of difficulty talking.  Remote stroke with residual left-sided weakness.  Smoker.  Diabetic.  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  MRI HEAD WITH AND WITHOUT CONTRAST MRA HEAD WITHOUT CONTRAST MRA NECK WITHOUT AND WITH CONTRAST  Technique: Multiplanar, multiecho pulse sequences of the brain and surrounding structures were obtained according to standard protocol with and without intravenous contrast.  Angiographic images of the Circle of Willis were obtained using MRA technique without  intravenous contrast.  Angiographic images of the neck were obtained using MRA technique without and with intravenous contrast.  Contrast:13 ml MultiHance.  Comparison:03/23/2011 MR.  MRI HEAD  Findings: No acute infarct.  Remote infarct posterior left lenticular nucleus.  Small vessel disease type changes.  No hydrocephalus.  Right mastoid air cell opacification once again noted.  No obstructing lesion in the posterior-superior nasopharynx detected as a cause of eustachian tube dysfunction.  Mild paranasal sinus mucosal thickening.  No intracranial mass lesion detected on this unenhanced exam.  Post radiation therapy type changes of bone marrow suspected.  IMPRESSION: No acute infarct.  Please see above.  MRA HEAD  Findings:Motion degraded exam.  This somewhat limits evaluation for detection of aneurysm or grading stenosis accurately.  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  Moderate narrowing proximal A1 segment left anterior cerebral artery and mild to moderate narrowing mid aspect of the M1 segment of the left middle cerebral artery may be present versus result of artifact from motion.  Right vertebral artery is dominant.  Nonvisualization right PICA.  Mild narrowing proximal basilar artery.  Nonvisualization left AICA.  IMPRESSION: Motion degraded exam.  This somewhat limits evaluation for detection of aneurysm or grading stenosis accurately.  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  Moderate narrowing proximal A1 segment left anterior cerebral artery and mild to moderate narrowing mid aspect of the M1 segment of the left middle cerebral artery may be present versus result of artifact from motion.  Right vertebral artery is dominant.  Nonvisualization right PICA.  Mild narrowing proximal basilar artery.  Nonvisualization left AICA.  MRA NECK  Findings:Normal configuration origin of the great vessels.  Mild narrowing proximal left common carotid artery.  Irregularity  proximal left internal carotid artery without measurable stenosis although focal plaque distal bulb level noted.  Minimal irregularity proximal right internal carotid artery without measurable stenosis.  Mild to moderate narrowing of proximal right subclavian artery.  Moderate to slightly marked stenosis proximal left vertebral artery.  Mild irregularity proximal right vertebral artery without significant narrowing.  IMPRESSION: Normal configuration origin of the great vessels.  Mild narrowing proximal left common carotid artery.  Irregularity proximal left internal carotid artery without measurable stenosis although focal plaque distal bulb level noted.  Minimal irregularity proximal right internal carotid artery without measurable stenosis.  Mild to moderate narrowing of proximal right subclavian artery.  Moderate to slightly marked stenosis proximal left vertebral artery.  Mild irregularity proximal right vertebral artery without significant narrowing.  Initial interpretation by Dr. Benard Rink at the time imaging.  Original Report Authenticated By: Fuller Canada, M.D.   Mr Angiogram Neck W Wo Contrast  07/24/2011  *RADIOLOGY REPORT*  Clinical Data:  Episode of difficulty talking.  Remote stroke with residual left-sided weakness.  Smoker.  Diabetic.  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  MRI HEAD WITH AND WITHOUT CONTRAST MRA HEAD WITHOUT CONTRAST MRA NECK WITHOUT AND WITH CONTRAST  Technique: Multiplanar, multiecho pulse sequences of the brain and surrounding structures were obtained according to standard protocol with and without intravenous contrast.  Angiographic images of the Circle of Willis were obtained using MRA technique without intravenous contrast.  Angiographic images of the neck were obtained using MRA technique without and with intravenous contrast.  Contrast:13 ml MultiHance.  Comparison:03/23/2011 MR.  MRI HEAD  Findings: No acute infarct.  Remote infarct posterior left  lenticular nucleus.  Small vessel disease type changes.  No hydrocephalus.  Right mastoid air cell opacification once again noted.  No obstructing lesion in the posterior-superior nasopharynx detected as a cause of eustachian tube dysfunction.  Mild paranasal sinus mucosal thickening.  No intracranial mass lesion detected on this unenhanced exam.  Post radiation therapy type changes of bone marrow suspected.  IMPRESSION: No acute infarct.  Please see above.  MRA HEAD  Findings:Motion degraded exam.  This somewhat limits evaluation for detection of aneurysm or grading stenosis accurately.  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  Moderate narrowing proximal A1 segment left anterior cerebral artery and mild to moderate narrowing mid aspect of the M1 segment of the left middle cerebral artery may be present versus result of artifact from motion.  Right vertebral artery is dominant.  Nonvisualization right PICA.  Mild narrowing proximal basilar artery.  Nonvisualization left AICA.  IMPRESSION: Motion degraded exam.  This somewhat limits evaluation for detection of aneurysm or grading stenosis accurately.  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  Moderate narrowing proximal A1 segment left anterior cerebral artery and mild to moderate narrowing mid aspect of the M1 segment of the left middle cerebral artery may be present versus result of artifact from motion.  Right vertebral artery is dominant.  Nonvisualization right PICA.  Mild narrowing proximal basilar artery.  Nonvisualization left AICA.  MRA NECK  Findings:Normal configuration origin of the great vessels.  Mild narrowing proximal left common carotid artery.  Irregularity proximal left internal carotid artery without measurable stenosis although focal plaque distal bulb level noted.  Minimal irregularity  proximal right internal carotid artery without measurable stenosis.  Mild to moderate narrowing of proximal right subclavian  artery.  Moderate to slightly marked stenosis proximal left vertebral artery.  Mild irregularity proximal right vertebral artery without significant narrowing.  IMPRESSION: Normal configuration origin of the great vessels.  Mild narrowing proximal left common carotid artery.  Irregularity proximal left internal carotid artery without measurable stenosis although focal plaque distal bulb level noted.  Minimal irregularity proximal right internal carotid artery without measurable stenosis.  Mild to moderate narrowing of proximal right subclavian artery.  Moderate to slightly marked stenosis proximal left vertebral artery.  Mild irregularity proximal right vertebral artery without significant narrowing.  Initial interpretation by Dr. Benard Rink at the time imaging.  Original Report Authenticated By: Fuller Canada, M.D.   Mr Brain Wo Contrast  07/24/2011  *RADIOLOGY REPORT*  Clinical Data:  Episode of difficulty talking.  Remote stroke with residual left-sided weakness.  Smoker.  Diabetic.  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  MRI HEAD WITH AND WITHOUT CONTRAST MRA HEAD WITHOUT CONTRAST MRA NECK WITHOUT AND WITH CONTRAST  Technique: Multiplanar, multiecho pulse sequences of the brain and surrounding structures were obtained according to standard protocol with and without intravenous contrast.  Angiographic images of the Circle of Willis were obtained using MRA technique without intravenous contrast.  Angiographic images of the neck were obtained using MRA technique without and with intravenous contrast.  Contrast:13 ml MultiHance.  Comparison:03/23/2011 MR.  MRI HEAD  Findings: No acute infarct.  Remote infarct posterior left lenticular nucleus.  Small vessel disease type changes.  No hydrocephalus.  Right mastoid air cell opacification once again noted.  No obstructing lesion in the posterior-superior nasopharynx detected as a cause of eustachian tube dysfunction.  Mild paranasal sinus mucosal  thickening.  No intracranial mass lesion detected on this unenhanced exam.  Post radiation therapy type changes of bone marrow suspected.  IMPRESSION: No acute infarct.  Please see above.  MRA HEAD  Findings:Motion degraded exam.  This somewhat limits evaluation for detection of aneurysm or grading stenosis accurately.  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  Moderate narrowing proximal A1 segment left anterior cerebral artery and mild to moderate narrowing mid aspect of the M1 segment of the left middle cerebral artery may be present versus result of artifact from motion.  Right vertebral artery is dominant.  Nonvisualization right PICA.  Mild narrowing proximal basilar artery.  Nonvisualization left AICA.  IMPRESSION: Motion degraded exam.  This somewhat limits evaluation for detection of aneurysm or grading stenosis accurately.  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  Moderate narrowing proximal A1 segment left anterior cerebral artery and mild to moderate narrowing mid aspect of the M1 segment of the left middle cerebral artery may be present versus result of artifact from motion.  Right vertebral artery is dominant.  Nonvisualization right PICA.  Mild narrowing proximal basilar artery.  Nonvisualization left AICA.  MRA NECK  Findings:Normal configuration origin of the great vessels.  Mild narrowing proximal left common carotid artery.  Irregularity proximal left internal carotid artery without measurable stenosis although focal plaque distal bulb level noted.  Minimal irregularity proximal right internal carotid artery without measurable stenosis.  Mild to moderate narrowing of proximal right subclavian artery.  Moderate to slightly marked stenosis proximal left vertebral artery.  Mild irregularity proximal right vertebral artery without significant narrowing.  IMPRESSION: Normal configuration origin of the great vessels.  Mild narrowing proximal left common carotid  artery.  Irregularity proximal left internal carotid artery without measurable stenosis although focal plaque distal bulb level noted.  Minimal irregularity proximal right internal carotid artery without measurable stenosis.  Mild to moderate narrowing of proximal right subclavian artery.  Moderate to slightly marked stenosis proximal left vertebral artery.  Mild irregularity proximal right vertebral artery without significant narrowing.  Initial interpretation by Dr. Benard Rink at the time imaging.  Original Report Authenticated By: Fuller Canada, M.D.    IMPRESSION: 1. Probable TIA - multiple cerebrovascular risk factors, prior stroke 2. CAD - stable without chest pain 3. DM2 4. HTN 5. DVT 6. History of tongue cancer 7. PAD s/p bypass  RECOMMENDATION: 1. Will review 2D echo today.  She is also scheduled to have a transcranial doppler. If there is no evidence for shunt on transcranial doppler and the 2D echo is not remarkable for valvular insufficiency, vegetation or other cardiac source of embolus, no further testing would be necessary.  If there are significant abnormalities on either study, a TEE may be reasonable and could be performed Friday. So far, there has been no evidence for cardiac arrythmia that I can find and no imaging evidence for embolic findings which are acute by MRI, suggesting against a cardiac source of embolus. She does have significant vascular disease and therefore the more likely cause is intrinsic cerebrovascular disease.  Finally, she was on aspirin and plavix at home prior to admission. If she had TIA despite this, I believe it would be recommended to escalate her anticoagulation to warfarin (which may be indicated due to her prior history of DVT).  If you were planning to switch her to coumadin anyway, identifying a cardiac source of embolus would be irrelevant.  Time Spent Directly with Patient: 30 minutes  Chrystie Nose, MD, [facility] Attending Cardiologist [facility]  Quiana Cobaugh C 07/24/2011, 12:00 PM

## 2011-07-24 NOTE — Progress Notes (Signed)
  Echocardiogram 2D Echocardiogram has been performed.  Mercy Moore 07/24/2011, 10:49 AM

## 2011-07-24 NOTE — Progress Notes (Signed)
Inpatient Diabetes Program Recommendations  AACE/ADA: New Consensus Statement on Inpatient Glycemic Control (2009)  Target Ranges:  Prepandial:   less than 140 mg/dL      Peak postprandial:   less than 180 mg/dL (1-2 hours)      Critically ill patients:  140 - 180 mg/dL   Reason for Visit: Hyperglycemia  Results for Brenda [name], Brenda [name] (MRN [id number]) as of 07/24/2011 17:40  Ref. Range 07/23/2011 18:55 07/23/2011 23:19 07/24/2011 06:39 07/24/2011 11:39 07/24/2011 16:55  Glucose-Capillary Latest Range: 70-99 mg/dL 409 (H) 811 (H) 914 (H) 241 (H) 232 (H)    Inpatient Diabetes Program Recommendations Insulin - Meal Coverage: Add Novolog 4 units tidwc Outpatient Referral: OP Diabetes Educ consult for HgbA1C > 7.  Note: Will follow.

## 2011-07-24 NOTE — Evaluation (Signed)
Physical Therapy Evaluation and Discharge Patient is being discharged from PT services secondary to:  Pt is at recent baseline for mobility and will have necessary support at home on d/c.   Please see below for current level of functioning.  Discharge plan discussed with patient/caregiver and they:  Agree   Patient Details Name: Brenda [name] MRN: [id number] DOB: [birth year] Today's Date: 07/24/2011  Problem List:  Patient Active Problem List  Diagnoses  . Tongue cancer  . DM (diabetes mellitus), type 2, uncontrolled  . TIA (transient ischemic attack)  . History of CVA (cerebrovascular accident)  . Tobacco abuse  . Hyperlipemia    Past Medical History:  Past Medical History  Diagnosis Date  . Diabetes mellitus   . Cancer of base of tongue   . History of DVT (deep vein thrombosis)   . Myocardial infarction   . Coronary artery disease   . Stroke 2000  . History of pneumonia   . History of radiation therapy 03/06/2009    right base of tongue primary and right neck node 7000 cGy 35 sessions   Past Surgical History:  Past Surgical History  Procedure Date  . Coronary stent placement   . Vascular bypass surgery     lower extremities  . Appendectomy     PT Assessment/Plan/Recommendation Clinical Impression Statement: Pt reports she is at baseline, however reports her baseline includes 3-4 falls per year due to imbalance problems.  Pt reports returned to [facility] ~1 year ago and has not maintained exercise program or used cane as advised.  Discussed return to OPPT on d/c and pt reports she cannot afford it.  Reviewed her HEP to be initiated and requested she discuss PT referral if she falls again between time of d/c and f/u visit with MD. PT Recommendation/Assessment: All further PT needs can be met in the next venue of care PT Problem List: Decreased strength;Decreased balance;Decreased knowledge of use of DME;Decreased safety awareness PT Therapy Diagnosis : Difficulty  walking Follow Up Recommendations: Outpatient PT;Other (comment) (Pt reports she cannot afford (and with ?compliance previously) Equipment Recommended: None recommended by PT PT Goals     PT Evaluation Precautions/Restrictions  Precautions Precaution Comments: Pt reports last fell ~1 month ago (tripped over edge of carpet); reports falls 3-4 times per year (off-balance); denies use of assistive device ("I can't ever remember to take it with me.") Prior Functioning  Home Living Lives With: Spouse Receives Help From: Other (Comment) (independent PTA) Type of Home: House Home Layout: Two level Alternate Level Stairs-Rails: Right Alternate Level Stairs-Number of Steps: 1 flight Home Access: Stairs to enter Entrance Stairs-Rails: Right Entrance Stairs-Number of Steps: 2 Bathroom Shower/Tub: Engineer, manufacturing systems: Standard Home Adaptive Equipment: Bedside commode/3-in-1;Straight cane;Walker - standard Prior Function Level of Independence: Independent with basic ADLs;Independent with homemaking with ambulation;Independent with gait Driving: Yes Vocation: On disability Cognition Cognition Arousal/Alertness: Lethargic (slightly lethargic--poor sleep last night and lack of coffee) Overall Cognitive Status: Appears within functional limits for tasks assessed Orientation Level: Oriented X4 Sensation/Coordination Sensation Light Touch: Impaired by gross assessment (decr in LLE due to bypass incision) Extremity Assessment RLE Assessment RLE Assessment: Within Functional Limits (ankle DF, PF 5/5; quads 5/5) LLE Assessment LLE Assessment: Exceptions to [facility] LLE Strength LLE Overall Strength Comments: ankle DF, PF 5/5; quads 4/5 (pt reports as baseline since CVA 2002) Mobility (including Balance) Bed Mobility Bed Mobility: Yes Supine to Sit: 7: Independent Sitting - Scoot to Edge of Bed: 7: Independent Transfers Transfers: Yes Sit  to Stand: 7: Independent;Without upper  extremity assist;From bed;From toilet Stand to Sit: 7: Independent;Without upper extremity assist;To toilet;To chair/3-in-1 Ambulation/Gait Ambulation/Gait: Yes Ambulation/Gait Assistance: 5: Supervision Ambulation/Gait Assistance Details (indicate cue type and reason): minguard assist progressing to supervision; pt reports feels slightly woozy (worse than usual); able to turn head left and right and maintain straight path Ambulation Distance (Feet): 150 Feet (150 ft, seated rest and 150 ft again) Assistive device: None Gait Pattern: Trunk flexed (slightly wide base of support) Gait velocity: good ability to vary speed (very slow to fast) Stairs: Yes Stairs Assistance: 5: Supervision Stairs Assistance Details (indicate cue type and reason): for safety Stair Management Technique: One rail Right;Alternating pattern Number of Stairs: 2   Posture/Postural Control Posture/Postural Control: Postural limitations Postural Limitations: slight kyphosis Balance Balance Assessed: Yes Static Standing Balance Static Standing - Balance Support: No upper extremity supported Static Standing - Level of Assistance: 7: Independent Static Standing - Comment/# of Minutes: 2 minutes Rhomberg - Eyes Opened: 30  Rhomberg - Eyes Closed: 10  (incr sway with stepping response and opened eyes) Dynamic Standing Balance Dynamic Standing - Balance Activities: Reaching for objects Dynamic Standing - Comments: able to reach to pick up object off floor with supervision; 360 degree turn safely in 8 steps; Exercise  General Exercises - Lower Extremity Hip ABduction/ADduction: Other (comment);Standing (reviewes HEP given by OPPT ~1 year ago) Hip Flexion/Marching: Standing;Other (comment) (reviewed HEP from OPPT from ~1 yr ago; to be done at counter) Mini-Sqauts: Other (comment);Standing (reviewed HEP from OPPT from ~1 yr ago; to be done at counter) Other Exercises Other Exercises: standing hip extension at  counter Other Exercises: sit to stand from chair without use of UEs End of Session PT - End of Session Equipment Utilized During Treatment: Gait belt Activity Tolerance: Patient tolerated treatment well Patient left: in chair;with call bell in reach Nurse Communication: Mobility status for ambulation;Other (comment) (fall history as reported by pt) General Behavior During Session: [facility] for tasks performed Cognition: [facility] for tasks performed  Monesha Monreal 07/24/2011, 9:50 AM Pager [phone]

## 2011-07-24 NOTE — Progress Notes (Signed)
Subjective:  Brenda [name] is an 61 y.o. Female who complained yesterday of an episode of inability to talk. She was not feeling well the whole day today and experienced severe lightheadedness with inability to talk. She also could not write. The episode last about 20 to 30 minutes and resolved in the ambulance. 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 Patient complains of having nasal congestion and being off balance the entire day. She smokes one pack a day for many years. Has multiple stents in her heart and has diabetes. Initially stroke code was called, but cancelled before we arrived to the ED. Time of onset: 5:50 pm. No t-PA due to NIHSS of 0.  Ranking score of 1 She has remained stable overnight without any recurrent symptoms. She states that she's tried quitting smoking several times in the past without success but is willing to try again.  Objective: Vital signs in last 24 hours: Temp:  [97.9 F (36.6 C)-98.6 F (37 C)] 97.9 F (36.6 C) (02/20 0600) Pulse Rate:  [56-69] 68  (02/20 0600) Resp:  [16-20] 16  (02/20 0600) BP: (104-156)/(58-89) 108/68 mmHg (02/20 0600) SpO2:  [91 %-98 %] 91 % (02/20 0600) Weight:  [58.968 kg (130 lb)] 58.968 kg (130 lb) (02/20 0435) Weight change:  Last BM Date: 07/21/11  Intake/Output from previous day:   Intake/Output this shift:    Physical exam reveals a pleasant middle-aged Caucasian lady currently not in distress. Afebrile. Head is nontraumatic. Neck is supple without bruit. Cardiac exam no murmur or gallop. Lungs are clear to auscultation. Abdomen soft nontender. Distal pulses are feeble in the lower extremities Neurological exam Awake  Alert oriented x 3. Normal speech and language.eye movements full without nystagmus. Face symmetric. Tongue midline. Normal strength, tone, reflexes and coordination on the right side with mild 4+/5 weakness on the left side but diminished fine finger movements, left grip and left  hip flexor weakness. She orbits right over left upper extremity. Fine finger movements are diminished on the left. Mild left finger-to-nose coordination impairment. Normal sensation. Gait deferred.   Lab Results:  [facility] 07/23/11 1905  WBC 4.6  HGB 15.3*  HCT 43.5  PLT 166   BMET  Basename 07/23/11 1905  NA 134*  K 3.9  CL 97  CO2 30  GLUCOSE 269*  BUN 10  CREATININE 0.82  CALCIUM 9.9    Studies/Results: Mr [name] Wo Contrast  07/24/2011  *RADIOLOGY REPORT*  Clinical Data:  Episode of difficulty talking.  Remote stroke with residual left-sided weakness.  Smoker.  Diabetic.  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  MRI HEAD WITH AND WITHOUT CONTRAST MRA HEAD WITHOUT CONTRAST MRA NECK WITHOUT AND WITH CONTRAST  Technique: Multiplanar, multiecho pulse sequences of the brain and surrounding structures were obtained according to standard protocol with and without intravenous contrast.  Angiographic images of the Circle of Willis were obtained using MRA technique without intravenous contrast.  Angiographic images of the neck were obtained using MRA technique without and with intravenous contrast.  Contrast:13 ml MultiHance.  Comparison:03/23/2011 MR.  MRI HEAD  Findings: No acute infarct.  Remote infarct posterior left lenticular nucleus.  Small vessel disease type changes.  No hydrocephalus.  Right mastoid air cell opacification once again noted.  No obstructing lesion in the posterior-superior nasopharynx detected as a cause of eustachian tube dysfunction.  Mild paranasal sinus mucosal thickening.  No intracranial mass lesion detected on this unenhanced exam.  Post radiation therapy type changes of bone marrow suspected.  IMPRESSION: No acute infarct.  Please see above.  MRA HEAD  Findings:Motion degraded exam.  This somewhat limits evaluation for detection of aneurysm or grading stenosis accurately.  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  Moderate  narrowing proximal A1 segment left anterior cerebral artery and mild to moderate narrowing mid aspect of the M1 segment of the left middle cerebral artery may be present versus result of artifact from motion.  Right vertebral artery is dominant.  Nonvisualization right PICA.  Mild narrowing proximal basilar artery.  Nonvisualization left AICA.  IMPRESSION: Motion degraded exam.  This somewhat limits evaluation for detection of aneurysm or grading stenosis accurately.  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  Moderate narrowing proximal A1 segment left anterior cerebral artery and mild to moderate narrowing mid aspect of the M1 segment of the left middle cerebral artery may be present versus result of artifact from motion.  Right vertebral artery is dominant.  Nonvisualization right PICA.  Mild narrowing proximal basilar artery.  Nonvisualization left AICA.  MRA NECK  Findings:Normal configuration origin of the great vessels.  Mild narrowing proximal left common carotid artery.  Irregularity proximal left internal carotid artery without measurable stenosis although focal plaque distal bulb level noted.  Minimal irregularity proximal right internal carotid artery without measurable stenosis.  Mild to moderate narrowing of proximal right subclavian artery.  Moderate to slightly marked stenosis proximal left vertebral artery.  Mild irregularity proximal right vertebral artery without significant narrowing.  IMPRESSION: Normal configuration origin of the great vessels.  Mild narrowing proximal left common carotid artery.  Irregularity proximal left internal carotid artery without measurable stenosis although focal plaque distal bulb level noted.  Minimal irregularity proximal right internal carotid artery without measurable stenosis.  Mild to moderate narrowing of proximal right subclavian artery.  Moderate to slightly marked stenosis proximal left vertebral artery.  Mild irregularity proximal right  vertebral artery without significant narrowing.  Initial interpretation by Dr. Benard Rink at the time imaging.  Original Report Authenticated By: Fuller Canada, M.D.   Mr Angiogram Neck W Wo Contrast  07/24/2011  *RADIOLOGY REPORT*  Clinical Data:  Episode of difficulty talking.  Remote stroke with residual left-sided weakness.  Smoker.  Diabetic.  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  MRI HEAD WITH AND WITHOUT CONTRAST MRA HEAD WITHOUT CONTRAST MRA NECK WITHOUT AND WITH CONTRAST  Technique: Multiplanar, multiecho pulse sequences of the brain and surrounding structures were obtained according to standard protocol with and without intravenous contrast.  Angiographic images of the Circle of Willis were obtained using MRA technique without intravenous contrast.  Angiographic images of the neck were obtained using MRA technique without and with intravenous contrast.  Contrast:13 ml MultiHance.  Comparison:03/23/2011 MR.  MRI HEAD  Findings: No acute infarct.  Remote infarct posterior left lenticular nucleus.  Small vessel disease type changes.  No hydrocephalus.  Right mastoid air cell opacification once again noted.  No obstructing lesion in the posterior-superior nasopharynx detected as a cause of eustachian tube dysfunction.  Mild paranasal sinus mucosal thickening.  No intracranial mass lesion detected on this unenhanced exam.  Post radiation therapy type changes of bone marrow suspected.  IMPRESSION: No acute infarct.  Please see above.  MRA HEAD  Findings:Motion degraded exam.  This somewhat limits evaluation for detection of aneurysm or grading stenosis accurately.  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  Moderate narrowing proximal  A1 segment left anterior cerebral artery and mild to moderate narrowing mid aspect of the M1 segment of the left middle cerebral artery may be present versus result of artifact from motion.  Right vertebral artery is dominant.  Nonvisualization  right PICA.  Mild narrowing proximal basilar artery.  Nonvisualization left AICA.  IMPRESSION: Motion degraded exam.  This somewhat limits evaluation for detection of aneurysm or grading stenosis accurately.  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  Moderate narrowing proximal A1 segment left anterior cerebral artery and mild to moderate narrowing mid aspect of the M1 segment of the left middle cerebral artery may be present versus result of artifact from motion.  Right vertebral artery is dominant.  Nonvisualization right PICA.  Mild narrowing proximal basilar artery.  Nonvisualization left AICA.  MRA NECK  Findings:Normal configuration origin of the great vessels.  Mild narrowing proximal left common carotid artery.  Irregularity proximal left internal carotid artery without measurable stenosis although focal plaque distal bulb level noted.  Minimal irregularity proximal right internal carotid artery without measurable stenosis.  Mild to moderate narrowing of proximal right subclavian artery.  Moderate to slightly marked stenosis proximal left vertebral artery.  Mild irregularity proximal right vertebral artery without significant narrowing.  IMPRESSION: Normal configuration origin of the great vessels.  Mild narrowing proximal left common carotid artery.  Irregularity proximal left internal carotid artery without measurable stenosis although focal plaque distal bulb level noted.  Minimal irregularity proximal right internal carotid artery without measurable stenosis.  Mild to moderate narrowing of proximal right subclavian artery.  Moderate to slightly marked stenosis proximal left vertebral artery.  Mild irregularity proximal right vertebral artery without significant narrowing.  Initial interpretation by Dr. Benard Rink at the time imaging.  Original Report Authenticated By: Fuller Canada, M.D.   Mr Brain Wo Contrast  07/24/2011  *RADIOLOGY REPORT*  Clinical Data:  Episode of difficulty  talking.  Remote stroke with residual left-sided weakness.  Smoker.  Diabetic.  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  MRI HEAD WITH AND WITHOUT CONTRAST MRA HEAD WITHOUT CONTRAST MRA NECK WITHOUT AND WITH CONTRAST  Technique: Multiplanar, multiecho pulse sequences of the brain and surrounding structures were obtained according to standard protocol with and without intravenous contrast.  Angiographic images of the Circle of Willis were obtained using MRA technique without intravenous contrast.  Angiographic images of the neck were obtained using MRA technique without and with intravenous contrast.  Contrast:13 ml MultiHance.  Comparison:03/23/2011 MR.  MRI HEAD  Findings: No acute infarct.  Remote infarct posterior left lenticular nucleus.  Small vessel disease type changes.  No hydrocephalus.  Right mastoid air cell opacification once again noted.  No obstructing lesion in the posterior-superior nasopharynx detected as a cause of eustachian tube dysfunction.  Mild paranasal sinus mucosal thickening.  No intracranial mass lesion detected on this unenhanced exam.  Post radiation therapy type changes of bone marrow suspected.  IMPRESSION: No acute infarct.  Please see above.  MRA HEAD  Findings:Motion degraded exam.  This somewhat limits evaluation for detection of aneurysm or grading stenosis accurately.  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  Moderate narrowing proximal A1 segment left anterior cerebral artery and mild to moderate narrowing mid aspect of the M1 segment of the left middle cerebral artery may be present versus result of artifact from motion.  Right vertebral artery is dominant.  Nonvisualization right PICA.  Mild narrowing proximal basilar artery.  Nonvisualization left AICA.  IMPRESSION:  Motion degraded exam.  This somewhat limits evaluation for detection of aneurysm or grading stenosis accurately.  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   Moderate narrowing proximal A1 segment left anterior cerebral artery and mild to moderate narrowing mid aspect of the M1 segment of the left middle cerebral artery may be present versus result of artifact from motion.  Right vertebral artery is dominant.  Nonvisualization right PICA.  Mild narrowing proximal basilar artery.  Nonvisualization left AICA.  MRA NECK  Findings:Normal configuration origin of the great vessels.  Mild narrowing proximal left common carotid artery.  Irregularity proximal left internal carotid artery without measurable stenosis although focal plaque distal bulb level noted.  Minimal irregularity proximal right internal carotid artery without measurable stenosis.  Mild to moderate narrowing of proximal right subclavian artery.  Moderate to slightly marked stenosis proximal left vertebral artery.  Mild irregularity proximal right vertebral artery without significant narrowing.  IMPRESSION: Normal configuration origin of the great vessels.  Mild narrowing proximal left common carotid artery.  Irregularity proximal left internal carotid artery without measurable stenosis although focal plaque distal bulb level noted.  Minimal irregularity proximal right internal carotid artery without measurable stenosis.  Mild to moderate narrowing of proximal right subclavian artery.  Moderate to slightly marked stenosis proximal left vertebral artery.  Mild irregularity proximal right vertebral artery without significant narrowing.  Initial interpretation by Dr. Benard Rink at the time imaging.  Original Report Authenticated By: Fuller Canada, M.D.       Medications: I have reviewed the patient's current medications.  Assessment/Plan: 3-year-old lady with transient expressive speech difficulties due to left hemispheric TIA with multiple vascular risk factors of diabetes, coronary artery disease, prior stroke and smoking Plan ; I have counseled the patient to be compliant with her medications and to quit  smoking. Recommend continue Plavix and check transesophageal echocardiogram for cardiac source of embolism, smoking cessation counseling. Check echocardiogram and Doppler studies,Hb A1c.  LOS: 1 day   [place] Hennes,PRAMODKUMAR P 07/24/2011, 10:49 AM

## 2011-07-24 NOTE — Progress Notes (Signed)
Utilization review complete 

## 2011-07-24 NOTE — Progress Notes (Signed)
Patient ID: Brenda [name], female   DOB: Jan 10, 1951, 61 y.o.   MRN: [id number]  Subjective: No events overnight. Patient denies chest pain, shortness of breath, abdominal pain. Had bowel movement and reports ambulating. She denies any speech problems today.   Objective:  Vital signs in last 24 hours:  Filed Vitals:   07/23/11 2300 07/24/11 0200 07/24/11 0435 07/24/11 0600  BP: 145/76 104/65  108/68  Pulse: 56 69  68  Temp: 98 F (36.7 C) 97.9 F (36.6 C)  97.9 F (36.6 C)  TempSrc:      Resp: 16 16  16   Height:   5\' 5"  (1.651 m)   Weight:   58.968 kg (130 lb)   SpO2: 97% 92%  91%    Intake/Output from previous day:  No intake or output data in the 24 hours ending 07/24/11 0732  Physical Exam: General: Alert, awake, oriented x3, in no acute distress. HEENT: No bruits, no goiter. Moist mucous membranes, no scleral icterus, no conjunctival pallor. Heart: Regular rate and rhythm, S1/S2 +, no murmurs, rubs, gallops. Lungs: Clear to auscultation bilaterally. No wheezing, no rhonchi, no rales.  Abdomen: Soft, nontender, nondistended, positive bowel sounds. Extremities: No clubbing or cyanosis, no pitting edema,  positive pedal pulses. Neuro: Grossly nonfocal.  Lab Results:  Basic Metabolic Panel:    Component Value Date/Time   NA 134* 07/23/2011 1905   K 3.9 07/23/2011 1905   CL 97 07/23/2011 1905   CO2 30 07/23/2011 1905   BUN 10 07/23/2011 1905   CREATININE 0.82 07/23/2011 1905   GLUCOSE 269* 07/23/2011 1905   CALCIUM 9.9 07/23/2011 1905   CBC:    Component Value Date/Time   WBC 4.6 07/23/2011 1905   WBC 4.8 02/27/2011 1011   HGB 15.3* 07/23/2011 1905   HGB 13.9 02/27/2011 1011   HCT 43.5 07/23/2011 1905   HCT 41.2 02/27/2011 1011   PLT 166 07/23/2011 1905   PLT 165 02/27/2011 1011   MCV 89.9 07/23/2011 1905   MCV 90.5 02/27/2011 1011   NEUTROABS 3.3 07/23/2011 1905   NEUTROABS 3.8 02/27/2011 1011   LYMPHSABS 0.8 07/23/2011 1905   LYMPHSABS 0.8* 02/27/2011 1011   MONOABS 0.3  07/23/2011 1905   MONOABS 0.3 02/27/2011 1011   EOSABS 0.1 07/23/2011 1905   EOSABS 0.0 02/27/2011 1011   BASOSABS 0.1 07/23/2011 1905   BASOSABS 0.0 02/27/2011 1011    Lab 07/23/11 1905  WBC 4.6  HGB 15.3*  HCT 43.5  PLT 166  MCV 89.9  MCH 31.6  MCHC 35.2  RDW 13.9  LYMPHSABS 0.8  MONOABS 0.3  EOSABS 0.1  BASOSABS 0.1  BANDABS --    Lab 07/23/11 1905  NA 134*  K 3.9  CL 97  CO2 30  GLUCOSE 269*  BUN 10  CREATININE 0.82  CALCIUM 9.9  MG --    Lab 07/23/11 1905  INR 0.92  PROTIME --   Cardiac markers:  Lab 07/23/11 1905  CKMB 1.6  TROPONINI <0.30  MYOGLOBIN --   Studies/Results: No results found.  Medications: Scheduled Meds:   . aspirin  325 mg Oral Daily  . calcium carbonate  1 tablet Oral BID WC  . cholecalciferol  1,000 Units Oral Daily  . clopidogrel  75 mg Oral Daily  . insulin glargine  5 Units Subcutaneous QHS  . metFORMIN  1,000 mg Oral BID WC  . omega-3 acid ethyl esters  2 g Oral Daily  . pantoprazole  40 mg Oral Q1200  .  rosuvastatin  10 mg Oral Daily  . DISCONTD: aspirin  81 mg Oral Daily  . DISCONTD: nicotine  21 mg Transdermal Daily   Continuous Infusions:  PRN Meds:.acetaminophen, gadobenate dimeglumine, insulin aspart, DISCONTD: insulin glulisine  Assessment/Plan:  Principal Problem:  *TIA (transient ischemic attack) - stroke work up still in process - MRI and 2 D ECHO pending - FLP and A1C still pending - PT/OT/ST evaluation in progress - for now will continue Plavix and will follow up on the rest of the work up - pt desires to go home today and if work up negative and neurology agrees we can plan on d.c in the aftrernoon  Active Problems:  DM (diabetes mellitus), type 2, uncontrolled - A1C pending - continue insulin and metformin   History of CVA (cerebrovascular accident)  PT evaluation pending   Hyperlipemia - FLP pending - continue statin   Tobacco abuse - cessation consult provided   EDUCATION - test results  and diagnostic studies were discussed with patient  - patient verbalized the understanding - questions were answered at the bedside and contact information was provided for additional questions or concerns   LOS: 1 day   MAGICK-Leeana Creer 07/24/2011, 7:32 AM  [facility] Pager: [phone]

## 2011-07-25 LAB — GLUCOSE, CAPILLARY
Glucose-Capillary: 94 mg/dL (ref 70–99)
Glucose-Capillary: 152 mg/dL — ABNORMAL HIGH (ref 70–99)
Glucose-Capillary: 193 mg/dL — ABNORMAL HIGH (ref 70–99)
Glucose-Capillary: 208 mg/dL — ABNORMAL HIGH (ref 70–99)

## 2011-07-25 LAB — CBC
MCHC: 34.2 g/dL (ref 30.0–36.0)
Platelets: 177 10*3/uL (ref 150–400)
RDW: 14 % (ref 11.5–15.5)
WBC: 5.8 10*3/uL (ref 4.0–10.5)

## 2011-07-25 LAB — BASIC METABOLIC PANEL
Chloride: 100 mEq/L (ref 96–112)
Creatinine, Ser: 0.83 mg/dL (ref 0.50–1.10)
GFR calc Af Amer: 87 mL/min — ABNORMAL LOW (ref >90)
GFR calc non Af Amer: 75 mL/min — ABNORMAL LOW (ref >90)
Potassium: 4.6 mEq/L (ref 3.5–5.1)

## 2011-07-25 MED ORDER — METFORMIN HCL 500 MG PO TABS
1000.0000 mg | ORAL_TABLET | Freq: Two times a day (BID) | ORAL | Status: DC
  Administered 2011-07-25 – 2011-07-26 (×2): 1000 mg via ORAL
  Filled 2011-07-25 (×4): qty 2

## 2011-07-25 MED ORDER — ROSUVASTATIN CALCIUM 20 MG PO TABS
20.0000 mg | ORAL_TABLET | Freq: Every day | ORAL | Status: DC
  Administered 2011-07-25: 20 mg via ORAL
  Filled 2011-07-25 (×3): qty 1

## 2011-07-25 NOTE — Progress Notes (Signed)
VASCULAR LAB PRELIMINARY  PRELIMINARY  PRELIMINARY  PRELIMINARY TCD and Carotid have been completed.    Preliminary report: Mild plaque disease without significant ICA stenosis.  Brenda [name], 07/25/2011, 7:20 PM

## 2011-07-25 NOTE — Progress Notes (Signed)
Patient ID: Brenda [name], female   DOB: 1950-12-07, 61 y.o.   MRN: [id number]  Subjective: No events overnight. Patient denies chest pain, shortness of breath, abdominal pain. Had bowel movement and reports ambulating.  Objective:  Vital signs in last 24 hours:  Filed Vitals:   07/25/11 0155 07/25/11 0602 07/25/11 0830 07/25/11 1021  BP: 110/65 98/63 97/62  110/79  Pulse: 69 63 59 65  Temp: 98.1 F (36.7 C) 98.3 F (36.8 C) 97.9 F (36.6 C) 97.2 F (36.2 C)  TempSrc: Oral Oral Oral Oral  Resp: 20 20 20 20   Height:      Weight:      SpO2: 97% 94% 97% 97%    Intake/Output from previous day:   Intake/Output Summary (Last 24 hours) at 07/25/11 1614 Last data filed at 07/25/11 1000  Gross per 24 hour  Intake    576 ml  Output      0 ml  Net    576 ml    Physical Exam: General: Alert, awake, oriented x3, in no acute distress. HEENT: No bruits, no goiter. Moist mucous membranes, no scleral icterus, no conjunctival pallor. Heart: Regular rate and rhythm, S1/S2 +, no murmurs, rubs, gallops. Lungs: Clear to auscultation bilaterally. No wheezing, no rhonchi, no rales.  Abdomen: Soft, nontender, nondistended, positive bowel sounds. Extremities: No clubbing or cyanosis, no pitting edema,  positive pedal pulses. Neuro: Grossly nonfocal.  Lab Results:  Basic Metabolic Panel:    Component Value Date/Time   NA 135 07/25/2011 0616   K 4.6 07/25/2011 0616   CL 100 07/25/2011 0616   CO2 28 07/25/2011 0616   BUN 14 07/25/2011 0616   CREATININE 0.83 07/25/2011 0616   GLUCOSE 110* 07/25/2011 0616   CALCIUM 9.9 07/25/2011 0616   CBC:    Component Value Date/Time   WBC 5.8 07/25/2011 0616   WBC 4.8 02/27/2011 1011   HGB 15.1* 07/25/2011 0616   HGB 13.9 02/27/2011 1011   HCT 44.1 07/25/2011 0616   HCT 41.2 02/27/2011 1011   PLT 177 07/25/2011 0616   PLT 165 02/27/2011 1011   MCV 90.0 07/25/2011 0616   MCV 90.5 02/27/2011 1011   NEUTROABS 3.3 07/23/2011 1905   NEUTROABS 3.8 02/27/2011 1011     LYMPHSABS 0.8 07/23/2011 1905   LYMPHSABS 0.8* 02/27/2011 1011   MONOABS 0.3 07/23/2011 1905   MONOABS 0.3 02/27/2011 1011   EOSABS 0.1 07/23/2011 1905   EOSABS 0.0 02/27/2011 1011   BASOSABS 0.1 07/23/2011 1905   BASOSABS 0.0 02/27/2011 1011      Lab 07/25/11 0616 07/23/11 1905  WBC 5.8 4.6  HGB 15.1* 15.3*  HCT 44.1 43.5  PLT 177 166  MCV 90.0 89.9  MCH 30.8 31.6  MCHC 34.2 35.2  RDW 14.0 13.9  LYMPHSABS -- 0.8  MONOABS -- 0.3  EOSABS -- 0.1  BASOSABS -- 0.1  BANDABS -- --    Lab 07/25/11 0616 07/23/11 1905  NA 135 134*  K 4.6 3.9  CL 100 97  CO2 28 30  GLUCOSE 110* 269*  BUN 14 10  CREATININE 0.83 0.82  CALCIUM 9.9 9.9  MG -- --    Lab 07/23/11 1905  INR 0.92  PROTIME --   Cardiac markers:  Lab 07/23/11 1905  CKMB 1.6  TROPONINI <0.30  MYOGLOBIN --   No components found with this basename: POCBNP:3 No results found for this or any previous visit (from the past 240 hour(s)).  Studies/Results: Mr [facility] Contrast  07/24/2011  *RADIOLOGY REPORT*  Clinical Data:  Episode of difficulty talking.  Remote stroke with residual left-sided weakness.  Smoker.  Diabetic.  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  MRI HEAD WITH AND WITHOUT CONTRAST MRA HEAD WITHOUT CONTRAST MRA NECK WITHOUT AND WITH CONTRAST  Technique: Multiplanar, multiecho pulse sequences of the brain and surrounding structures were obtained according to standard protocol with and without intravenous contrast.  Angiographic images of the Circle of Willis were obtained using MRA technique without intravenous contrast.  Angiographic images of the neck were obtained using MRA technique without and with intravenous contrast.  Contrast:13 ml MultiHance.  Comparison:03/23/2011 MR.  MRI HEAD  Findings: No acute infarct.  Remote infarct posterior left lenticular nucleus.  Small vessel disease type changes.  No hydrocephalus.  Right mastoid air cell opacification once again noted.  No obstructing  lesion in the posterior-superior nasopharynx detected as a cause of eustachian tube dysfunction.  Mild paranasal sinus mucosal thickening.  No intracranial mass lesion detected on this unenhanced exam.  Post radiation therapy type changes of bone marrow suspected.  IMPRESSION: No acute infarct.  Please see above.  MRA HEAD  Findings:Motion degraded exam.  This somewhat limits evaluation for detection of aneurysm or grading stenosis accurately.  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  Moderate narrowing proximal A1 segment left anterior cerebral artery and mild to moderate narrowing mid aspect of the M1 segment of the left middle cerebral artery may be present versus result of artifact from motion.  Right vertebral artery is dominant.  Nonvisualization right PICA.  Mild narrowing proximal basilar artery.  Nonvisualization left AICA.  IMPRESSION: Motion degraded exam.  This somewhat limits evaluation for detection of aneurysm or grading stenosis accurately.  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  Moderate narrowing proximal A1 segment left anterior cerebral artery and mild to moderate narrowing mid aspect of the M1 segment of the left middle cerebral artery may be present versus result of artifact from motion.  Right vertebral artery is dominant.  Nonvisualization right PICA.  Mild narrowing proximal basilar artery.  Nonvisualization left AICA.  MRA NECK  Findings:Normal configuration origin of the great vessels.  Mild narrowing proximal left common carotid artery.  Irregularity proximal left internal carotid artery without measurable stenosis although focal plaque distal bulb level noted.  Minimal irregularity proximal right internal carotid artery without measurable stenosis.  Mild to moderate narrowing of proximal right subclavian artery.  Moderate to slightly marked stenosis proximal left vertebral artery.  Mild irregularity proximal right vertebral artery without  significant narrowing.  IMPRESSION: Normal configuration origin of the great vessels.  Mild narrowing proximal left common carotid artery.  Irregularity proximal left internal carotid artery without measurable stenosis although focal plaque distal bulb level noted.  Minimal irregularity proximal right internal carotid artery without measurable stenosis.  Mild to moderate narrowing of proximal right subclavian artery.  Moderate to slightly marked stenosis proximal left vertebral artery.  Mild irregularity proximal right vertebral artery without significant narrowing.  Initial interpretation by Dr. Benard Rink at the time imaging.  Original Report Authenticated By: Fuller Canada, M.D.   Mr Angiogram Neck W Wo Contrast  07/24/2011  *RADIOLOGY REPORT*  Clinical Data:  Episode of difficulty talking.  Remote stroke with residual left-sided weakness.  Smoker.  Diabetic.  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  MRI HEAD WITH AND WITHOUT CONTRAST MRA HEAD WITHOUT CONTRAST MRA NECK WITHOUT AND WITH CONTRAST  Technique:  Multiplanar, multiecho pulse sequences of the brain and surrounding structures were obtained according to standard protocol with and without intravenous contrast.  Angiographic images of the Circle of Willis were obtained using MRA technique without intravenous contrast.  Angiographic images of the neck were obtained using MRA technique without and with intravenous contrast.  Contrast:13 ml MultiHance.  Comparison:03/23/2011 MR.  MRI HEAD  Findings: No acute infarct.  Remote infarct posterior left lenticular nucleus.  Small vessel disease type changes.  No hydrocephalus.  Right mastoid air cell opacification once again noted.  No obstructing lesion in the posterior-superior nasopharynx detected as a cause of eustachian tube dysfunction.  Mild paranasal sinus mucosal thickening.  No intracranial mass lesion detected on this unenhanced exam.  Post radiation therapy type changes of bone marrow  suspected.  IMPRESSION: No acute infarct.  Please see above.  MRA HEAD  Findings:Motion degraded exam.  This somewhat limits evaluation for detection of aneurysm or grading stenosis accurately.  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  Moderate narrowing proximal A1 segment left anterior cerebral artery and mild to moderate narrowing mid aspect of the M1 segment of the left middle cerebral artery may be present versus result of artifact from motion.  Right vertebral artery is dominant.  Nonvisualization right PICA.  Mild narrowing proximal basilar artery.  Nonvisualization left AICA.  IMPRESSION: Motion degraded exam.  This somewhat limits evaluation for detection of aneurysm or grading stenosis accurately.  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  Moderate narrowing proximal A1 segment left anterior cerebral artery and mild to moderate narrowing mid aspect of the M1 segment of the left middle cerebral artery may be present versus result of artifact from motion.  Right vertebral artery is dominant.  Nonvisualization right PICA.  Mild narrowing proximal basilar artery.  Nonvisualization left AICA.  MRA NECK  Findings:Normal configuration origin of the great vessels.  Mild narrowing proximal left common carotid artery.  Irregularity proximal left internal carotid artery without measurable stenosis although focal plaque distal bulb level noted.  Minimal irregularity proximal right internal carotid artery without measurable stenosis.  Mild to moderate narrowing of proximal right subclavian artery.  Moderate to slightly marked stenosis proximal left vertebral artery.  Mild irregularity proximal right vertebral artery without significant narrowing.  IMPRESSION: Normal configuration origin of the great vessels.  Mild narrowing proximal left common carotid artery.  Irregularity proximal left internal carotid artery without measurable stenosis although focal plaque distal bulb level  noted.  Minimal irregularity proximal right internal carotid artery without measurable stenosis.  Mild to moderate narrowing of proximal right subclavian artery.  Moderate to slightly marked stenosis proximal left vertebral artery.  Mild irregularity proximal right vertebral artery without significant narrowing.  Initial interpretation by Dr. Benard Rink at the time imaging.  Original Report Authenticated By: Fuller Canada, M.D.   Mr Brain Wo Contrast  07/24/2011  *RADIOLOGY REPORT*  Clinical Data:  Episode of difficulty talking.  Remote stroke with residual left-sided weakness.  Smoker.  Diabetic.  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  MRI HEAD WITH AND WITHOUT CONTRAST MRA HEAD WITHOUT CONTRAST MRA NECK WITHOUT AND WITH CONTRAST  Technique: Multiplanar, multiecho pulse sequences of the brain and surrounding structures were obtained according to standard protocol with and without intravenous contrast.  Angiographic images of the Circle of Willis were obtained using MRA technique without intravenous contrast.  Angiographic images of the neck were obtained using MRA technique without and with intravenous contrast.  Contrast:13 ml MultiHance.  Comparison:03/23/2011 MR.  MRI HEAD  Findings: No acute infarct.  Remote infarct posterior left lenticular nucleus.  Small vessel disease type changes.  No hydrocephalus.  Right mastoid air cell opacification once again noted.  No obstructing lesion in the posterior-superior nasopharynx detected as a cause of eustachian tube dysfunction.  Mild paranasal sinus mucosal thickening.  No intracranial mass lesion detected on this unenhanced exam.  Post radiation therapy type changes of bone marrow suspected.  IMPRESSION: No acute infarct.  Please see above.  MRA HEAD  Findings:Motion degraded exam.  This somewhat limits evaluation for detection of aneurysm or grading stenosis accurately.  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  Moderate  narrowing proximal A1 segment left anterior cerebral artery and mild to moderate narrowing mid aspect of the M1 segment of the left middle cerebral artery may be present versus result of artifact from motion.  Right vertebral artery is dominant.  Nonvisualization right PICA.  Mild narrowing proximal basilar artery.  Nonvisualization left AICA.  IMPRESSION: Motion degraded exam.  This somewhat limits evaluation for detection of aneurysm or grading stenosis accurately.  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  Moderate narrowing proximal A1 segment left anterior cerebral artery and mild to moderate narrowing mid aspect of the M1 segment of the left middle cerebral artery may be present versus result of artifact from motion.  Right vertebral artery is dominant.  Nonvisualization right PICA.  Mild narrowing proximal basilar artery.  Nonvisualization left AICA.  MRA NECK  Findings:Normal configuration origin of the great vessels.  Mild narrowing proximal left common carotid artery.  Irregularity proximal left internal carotid artery without measurable stenosis although focal plaque distal bulb level noted.  Minimal irregularity proximal right internal carotid artery without measurable stenosis.  Mild to moderate narrowing of proximal right subclavian artery.  Moderate to slightly marked stenosis proximal left vertebral artery.  Mild irregularity proximal right vertebral artery without significant narrowing.  IMPRESSION: Normal configuration origin of the great vessels.  Mild narrowing proximal left common carotid artery.  Irregularity proximal left internal carotid artery without measurable stenosis although focal plaque distal bulb level noted.  Minimal irregularity proximal right internal carotid artery without measurable stenosis.  Mild to moderate narrowing of proximal right subclavian artery.  Moderate to slightly marked stenosis proximal left vertebral artery.  Mild irregularity proximal right  vertebral artery without significant narrowing.  Initial interpretation by Dr. Benard Rink at the time imaging.  Original Report Authenticated By: Fuller Canada, M.D.    Medications: Scheduled Meds:   . aspirin  325 mg Oral Daily  . calcium carbonate  1 tablet Oral BID WC  . cholecalciferol  1,000 Units Oral Daily  . clopidogrel  75 mg Oral Daily  . insulin aspart  0-9 Units Subcutaneous Q4H  . insulin glargine  10 Units Subcutaneous QHS  . metFORMIN  1,000 mg Oral BID WC  . omega-3 acid ethyl esters  2 g Oral Daily  . pantoprazole  40 mg Oral Q1200  . rosuvastatin  20 mg Oral q1800  . DISCONTD: metFORMIN  1,000 mg Oral BID WC  . DISCONTD: rosuvastatin  10 mg Oral Daily   Continuous Infusions:  PRN Meds:.acetaminophen  Assessment/Plan:  Principal Problem:  *TIA (transient ischemic attack)  - stroke work up still in process  - PT/OT/ST evaluation in progress  - for now will continue Plavix and will follow up on the rest of the work up  - plan is  to proceed with TEE in AM  Active Problems:  DM (diabetes mellitus), type 2, uncontrolled  - A1C elevated - continue insulin and metformin   History of CVA (cerebrovascular accident)  PT evaluation in progress  Hyperlipemia  - continue statin   Tobacco abuse  - cessation consult provided   EDUCATION  - test results and diagnostic studies were discussed with patient  - patient verbalized the understanding  - questions were answered at the bedside and contact information was provided for additional questions or concerns    LOS: 2 days   MAGICK-Kortney Schoenfelder 07/25/2011, 4:14 PM  [facility] Pager: [phone]

## 2011-07-25 NOTE — Progress Notes (Signed)
OT Discharge Note  Patient is being discharged from OT services secondary to:    Goals met and no further therapy needs identified.  Please see latest Therapy Progress Note for current level of functioning and progress toward goals.  Progress and discharge plan and discussed with patient/caregiver and they    Agree    [place], [place] Mitton Brynn   OTR/L Pager: [phone] Office: [phone] .     

## 2011-07-25 NOTE — Evaluation (Signed)
Speech Language Pathology Evaluation Patient Details Name: Brenda [name] MRN: [id number] DOB: 1951/05/25 Today's Date: 07/25/2011  HPI:  61 yr old admitted with sudden difficulty speaking and writing.  Pt. Had CVA in 2000 with mild residual left sided weakness.  Problem List:  Patient Active Problem List  Diagnoses  . Tongue cancer  . DM (diabetes mellitus), type 2, uncontrolled  . TIA (transient ischemic attack)  . History of CVA (cerebrovascular accident)  . Tobacco abuse  . Hyperlipemia   Past Medical History:  Past Medical History  Diagnosis Date  . Diabetes mellitus   . Cancer of base of tongue   . History of DVT (deep vein thrombosis)   . Myocardial infarction   . Coronary artery disease   . Stroke 2000  . History of pneumonia   . History of radiation therapy 03/06/2009    right base of tongue primary and right neck node 7000 cGy 35 sessions   Past Surgical History:  Past Surgical History  Procedure Date  . Coronary stent placement   . Vascular bypass surgery     lower extremities  . Appendectomy     SLP Assessment/Plan/Recommendation Assessment Clinical Impression Statement: Pt. exhibits speech-language-cognitive abilities that are within functional limits.  Pt. and daughter report pt.'s expressive language and writing dramatically improved in route to hospital and was at baseline night of admission.  No ST needs warranted at this time.  Pt. and daughter in agreement.  SLP Recommendation/Assessment: Patient does not need any further Speech Lanaguage Pathology Services No Skilled Speech Therapy: Patient at baseline level of functioning SLP Recommendations Follow up Recommendations: None Equipment Recommended: None recommended by PT Individuals Consulted Consulted and Agree with Results and Recommendations: Patient;Family member/caregiver  SLP Evaluation Prior Functioning Cognitive/Linguistic Baseline: Within functional limits Type of Home: House Lives  With: Spouse Receives Help From: Other (Comment) (independent PTA) Vocation:  (WORKED FOR [employer])  Cognition Overall Cognitive Status: Appears within functional limits for tasks assessed Arousal/Alertness: Awake/alert Orientation Level: Oriented X4  Comprehension Auditory Comprehension Overall Auditory Comprehension: Appears within functional limits for tasks assessed Visual Recognition/Discrimination Discrimination: Not tested Reading Comprehension Reading Status: Not tested (REPORTS NO DIFFICULTY READING)  Expression Expression Primary Mode of Expression: Verbal Verbal Expression Overall Verbal Expression: Appears within functional limits for tasks assessed Pragmatics: No impairment Non-Verbal Means of Communication: Not applicable Written Expression Dominant Hand: Right Written Expression: Within Functional Limits  Oral/Motor Oral Motor/Sensory Function Overall Oral Motor/Sensory Function: Impaired at baseline (SLIGHT LEFT LABIAL WEAKNESS DURING RETRACTION) Velum: Within Functional Limits Mandible: Within Functional Limits Motor Speech Overall Motor Speech: Appears within functional limits for tasks assessed Intelligibility: Intelligible Motor Planning: Witnin functional limits Motor Speech Errors: Not applicable  Brenda [name] Brenda [name] M.Ed [employer] [phone]  07/25/2011

## 2011-07-25 NOTE — Progress Notes (Signed)
History: Brenda [name] is an 61 y.o. Female who complained yesterday of an episode of inability to talk. She was not feeling well the whole day today and experienced severe lightheadedness with inability to talk. She also could not write. The episode last about 20 to 30 minutes and resolved in the ambulance. 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 Patient complains of having nasal congestion and being off balance the entire day. She smokes one pack a day for many years. Has multiple stents in her heart and has diabetes. Initially stroke code was called, but cancelled before we arrived to the ED.   Time of onset: 5:50 pm.  No t-PA due to NIHSS of 0.  Rankin score of 1  Subjective:  She has remained stable overnight without any recurrent symptoms.  She was seen in consult by Dr. Rennis Golden who felt TEE was not indicated- carotid dopplers and TCD's pending.  Objective: Vital signs in last 24 hours: Temp:  [97.2 F (36.2 C)-98.3 F (36.8 C)] 97.2 F (36.2 C) (02/21 1021) Pulse Rate:  [59-69] 65  (02/21 1021) Resp:  [18-20] 20  (02/21 1021) BP: (97-126)/(62-79) 110/79 mmHg (02/21 1021) SpO2:  [91 %-97 %] 97 % (02/21 1021) Weight change:  Last BM Date:  (pt states"last week sometime")  Intake/Output from previous day: 02/20 0701 - 02/21 0700 In: 940 [P.O.:940] Out: -  Intake/Output this shift: Total I/O In: 476 [P.O.:476] Out: -   Physical exam reveals a pleasant middle-aged Caucasian lady currently not in distress.  Neurological exam  Awake  Alert oriented x 3. Normal speech and language.eye movements full without nystagmus. Face symmetric. Tongue midline. Normal strength, tone, reflexes and coordination on the right side with mild 4+/5 weakness on the left side:, left grip and left hip flexor weakness. She orbits right over left upper extremity. Fine finger movements are diminished on the left. Mild left finger-to-nose coordination impairment. Normal  sensation. Gait deferred.   Lab Results:  [facility] 07/25/11 0616 07/23/11 1905  WBC 5.8 4.6  HGB 15.1* 15.3*  HCT 44.1 43.5  PLT 177 166   BMET  Basename 07/25/11 0616 07/23/11 1905  NA 135 134*  K 4.6 3.9  CL 100 97  CO2 28 30  GLUCOSE 110* 269*  BUN 14 10  CREATININE 0.83 0.82  CALCIUM 9.9 9.9   07/23/2011 HgA1C 9.5  07/24/2011 TC 183 TG 162 HDL 53 LDL 98  Studies/Results: 07/24/2011  MRI HEAD WITH AND WITHOUT CONTRAST Findings: No acute infarct.  Remote infarct posterior left lenticular nucleus.  Small vessel disease type changes.  No hydrocephalus.  Right mastoid air cell opacification once again noted.  No obstructing lesion in the posterior-superior nasopharynx detected as a cause of eustachian tube dysfunction.  Mild paranasal sinus mucosal thickening.  No intracranial mass lesion detected on this unenhanced exam.  Post radiation therapy type changes of bone marrow suspected.  IMPRESSION: No acute infarct.  Please see above.  Fuller Canada, M.D.  07/24/2011 MRA HEAD  Findings:Motion degraded exam.  This somewhat limits evaluation for detection of aneurysm or grading stenosis accurately.  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  Moderate narrowing proximal A1 segment left anterior cerebral artery and mild to moderate narrowing mid aspect of the M1 segment of the left middle cerebral artery may be present versus result of artifact from motion.  Right vertebral artery is dominant.  Nonvisualization right PICA.  Mild narrowing proximal basilar artery.  Nonvisualization left AICA.  IMPRESSION: Motion degraded exam.  This somewhat limits evaluation for detection of aneurysm or grading stenosis accurately.  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  Moderate narrowing proximal A1 segment left anterior cerebral artery and mild to moderate narrowing mid aspect of the M1 segment of the left middle cerebral artery may be present versus result of artifact  from motion.  Right vertebral artery is dominant.  Nonvisualization right PICA.  Mild narrowing proximal basilar artery.  Nonvisualization left AICA.  Fuller Canada, M.D.  07/24/2011 MRA NECK  Findings:Normal configuration origin of the great vessels.  Mild narrowing proximal left common carotid artery.  Irregularity proximal left internal carotid artery without measurable stenosis although focal plaque distal bulb level noted.  Minimal irregularity proximal right internal carotid artery without measurable stenosis.  Mild to moderate narrowing of proximal right subclavian artery.  Moderate to slightly marked stenosis proximal left vertebral artery.  Mild irregularity proximal right vertebral artery without significant narrowing.  IMPRESSION: Normal configuration origin of the great vessels.  Mild narrowing proximal left common carotid artery.  Irregularity proximal left internal carotid artery without measurable stenosis although focal plaque distal bulb level noted.  Minimal irregularity proximal right internal carotid artery without measurable stenosis.  Mild to moderate narrowing of proximal right subclavian artery.  Moderate to slightly marked stenosis proximal left vertebral artery.  Mild irregularity proximal right vertebral artery without significant narrowing.   Fuller Canada, M.D.    Assessment/Plan: 61 year old lady with  1. Left hemispheric TIA - TIA- transient expressive speech difficulties resolved. On Plavix and Asa. 2. Hypertension- controlled 3. Hyperlipidemia-LDL 98, not @ goal LDL <80 4. Diabetes Mellitus- poorly controlled, A1C 9.5, goal < 6.5- CBGs 170-260 5. PVD; prior CVA (2000) and bypass LE's 6. CAD/stent  Vascular risk factors of diabetes, coronary artery disease, prior stroke and smoking.  Plan; 1. TEE- needs to be scheduled, call placed to [facility] to schedule. 2. Continue Plavix and ASA 81 mg 3.  I have counseled the patient to be compliant with her medications and to  quit smoking. 4. Aggressive risk factor modification-   increase Crestor to 20 mg daily.    Consider adjusting Lantus for tighter CBG control.  Consider diabetes education consult.  Patient was seen and examined by Dr. Pearlean Brownie who agrees with above plan.   LOS: 2 days   JERNEJCIC,TARA C 07/25/2011, 10:48 AM [facility] [phone]

## 2011-07-25 NOTE — Plan of Care (Signed)
Problem: Phase II Progression Outcomes Goal: Able to communicate Speech-language-cognitive assessment completed.  No ST services needed.  See full report for details.  Brenda [name].Ed [employer] [phone]  07/25/2011

## 2011-07-25 NOTE — Progress Notes (Signed)
In-patient Diabetes Program  Results for Brenda [name], Brenda [name] (MRN [id number]) as of 07/25/2011 13:09  Ref. Range 07/23/2011 23:32  Hemoglobin A1C Latest Range: <5.7 % 9.5 (H)   Pt is on Novolog 4 units tidwc and Lantus 5 units QHS at home.  Please add meal coverage insulin - Novolog 4 units tidwc.

## 2011-07-25 NOTE — Progress Notes (Signed)
Brenda [name] is scheduled for TEE 07/26/11 at 1100 with Dr. Rennis Golden. She will be NPO after MN.

## 2011-07-26 ENCOUNTER — Encounter (HOSPITAL_COMMUNITY)
Admission: EM | Disposition: A | Discharge status: Home / Self Care | Payer: Self-pay | Source: Home / Self Care | Attending: Emergency Medicine

## 2011-07-26 ENCOUNTER — Encounter (HOSPITAL_COMMUNITY): Payer: Self-pay | Admitting: *Deleted

## 2011-07-26 HISTORY — PX: TEE WITHOUT CARDIOVERSION: SHX5443

## 2011-07-26 LAB — CBC
MCV: 89.4 fL (ref 78.0–100.0)
Platelets: 167 10*3/uL (ref 150–400)
RBC: 4.83 MIL/uL (ref 3.87–5.11)
RDW: 14 % (ref 11.5–15.5)
WBC: 5.1 10*3/uL (ref 4.0–10.5)

## 2011-07-26 LAB — GLUCOSE, CAPILLARY
Glucose-Capillary: 102 mg/dL — ABNORMAL HIGH (ref 70–99)
Glucose-Capillary: 135 mg/dL — ABNORMAL HIGH (ref 70–99)
Glucose-Capillary: 103 mg/dL — ABNORMAL HIGH (ref 70–99)
Glucose-Capillary: 235 mg/dL — ABNORMAL HIGH (ref 70–99)

## 2011-07-26 LAB — BASIC METABOLIC PANEL
CO2: 28 mEq/L (ref 19–32)
Calcium: 10 mg/dL (ref 8.4–10.5)
Creatinine, Ser: 0.82 mg/dL (ref 0.50–1.10)
GFR calc Af Amer: 88 mL/min — ABNORMAL LOW (ref >90)
Sodium: 138 mEq/L (ref 135–145)

## 2011-07-26 LAB — PROTIME-INR
INR: 0.96 (ref 0.00–1.49)
Prothrombin Time: 13 seconds (ref 11.6–15.2)

## 2011-07-26 SURGERY — ECHOCARDIOGRAM, TRANSESOPHAGEAL
Anesthesia: Moderate Sedation

## 2011-07-26 MED ORDER — FENTANYL CITRATE 0.05 MG/ML IJ SOLN
INTRAMUSCULAR | Status: AC
  Filled 2011-07-26: qty 2

## 2011-07-26 MED ORDER — MIDAZOLAM HCL 10 MG/2ML IJ SOLN
INTRAMUSCULAR | Status: DC | PRN
  Administered 2011-07-26: 2 mg via INTRAVENOUS
  Administered 2011-07-26: 1 mg via INTRAVENOUS

## 2011-07-26 MED ORDER — FENTANYL CITRATE 0.05 MG/ML IJ SOLN: 250.0000 ug | Freq: Once | INTRAMUSCULAR | Status: DC

## 2011-07-26 MED ORDER — SODIUM CHLORIDE 0.9 % IJ SOLN: 3.0000 mL | INTRAMUSCULAR | Status: DC | PRN

## 2011-07-26 MED ORDER — FENTANYL CITRATE 0.05 MG/ML IJ SOLN
INTRAMUSCULAR | Status: DC | PRN
  Administered 2011-07-26 (×2): 25 ug via INTRAVENOUS

## 2011-07-26 MED ORDER — MIDAZOLAM HCL 10 MG/2ML IJ SOLN
INTRAMUSCULAR | Status: AC
  Filled 2011-07-26: qty 2

## 2011-07-26 MED ORDER — SODIUM CHLORIDE 0.9 % IJ SOLN: 3.0000 mL | Freq: Two times a day (BID) | INTRAMUSCULAR | Status: DC

## 2011-07-26 MED ORDER — SODIUM CHLORIDE 0.45 % IV SOLN: INTRAVENOUS | Status: DC

## 2011-07-26 MED ORDER — LIDOCAINE VISCOUS 2 % MT SOLN
OROMUCOSAL | Status: DC | PRN
  Administered 2011-07-26: 10 mL via OROMUCOSAL

## 2011-07-26 MED ORDER — MIDAZOLAM HCL 10 MG/2ML IJ SOLN: 10.0000 mg | Freq: Once | INTRAMUSCULAR | Status: DC

## 2011-07-26 MED ORDER — SODIUM CHLORIDE 0.9 % IV SOLN: 250.0000 mL | INTRAVENOUS | Status: DC | PRN

## 2011-07-26 MED ORDER — LIDOCAINE VISCOUS 2 % MT SOLN
OROMUCOSAL | Status: AC
  Filled 2011-07-26: qty 15

## 2011-07-26 MED ORDER — BUTAMBEN-TETRACAINE-BENZOCAINE 2-2-14 % EX AERO
INHALATION_SPRAY | CUTANEOUS | Status: DC | PRN
  Administered 2011-07-26: 1 via TOPICAL

## 2011-07-26 MED ORDER — BENZOCAINE 20 % MT SOLN
1.0000 "application " | OROMUCOSAL | Status: DC | PRN
  Filled 2011-07-26: qty 57

## 2011-07-26 NOTE — Discharge Summary (Addendum)
Patient ID: Brenda [name] MRN: [id number] DOB/AGE: 1950-06-07 61 y.o.  Admit date: 07/23/2011 Discharge date: 07/26/2011  Primary Care Physician:  Michiel Sites, MD, MD  Discharge Diagnoses:    Present on Admission:  .DM (diabetes mellitus), type 2, uncontrolled .TIA (transient ischemic attack) .Tobacco abuse .Hyperlipemia  Principal Problem:  *TIA (transient ischemic attack) Active Problems:  DM (diabetes mellitus), type 2, uncontrolled  History of CVA (cerebrovascular accident)  Hyperlipemia  Tobacco abuse   Medication List  As of 07/26/2011  7:31 AM   ASK your doctor about these medications         acetaminophen 325 MG tablet   Commonly known as: TYLENOL   Take 650 mg by mouth every 6 (six) hours as needed. For pain      aspirin 81 MG tablet   Take 81 mg by mouth daily.      calcium carbonate 600 MG Tabs   Commonly known as: OS-CAL   Take 600 mg by mouth 2 (two) times daily with a meal.      cholecalciferol 1000 UNITS tablet   Commonly known as: VITAMIN D   Take 1,000 Units by mouth daily.      clopidogrel 75 MG tablet   Commonly known as: PLAVIX   Take 75 mg by mouth daily.      fish oil-omega-3 fatty acids 1000 MG capsule   Take 2 g by mouth daily.      glipiZIDE 10 MG tablet   Commonly known as: GLUCOTROL   Take 10 mg by mouth 2 (two) times daily before a meal.      insulin glargine 100 UNIT/ML injection   Commonly known as: LANTUS   Inject 5 Units into the skin at bedtime.      insulin glulisine 100 UNIT/ML injection   Commonly known as: APIDRA   Inject 4 Units into the skin 3 (three) times daily as needed. For blood sugar levels      metFORMIN 500 MG tablet   Commonly known as: GLUCOPHAGE   Take 1,000 mg by mouth 2 (two) times daily with a meal.      metoprolol tartrate 25 MG tablet   Commonly known as: LOPRESSOR   Take 25 mg by mouth 2 (two) times daily.      omeprazole 20 MG capsule   Commonly known as: PRILOSEC   Take 20 mg by  mouth daily.      rosuvastatin 10 MG tablet   Commonly known as: CRESTOR   Take 10 mg by mouth daily.      varenicline 1 MG tablet   Commonly known as: CHANTIX   Take 1 mg by mouth 2 (two) times daily.            Disposition and Follow-up: With PCP in 4 weeks.  Consults: cardiology  Significant Diagnostic Studies:  Mr Brenda [name] GE Contrast  07/24/2011  *RADIOLOGY REPORT*  Clinical Data:  Episode of difficulty talking.  Remote stroke with residual left-sided weakness.  Smoker.  Diabetic.  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  MRI HEAD WITH AND WITHOUT CONTRAST MRA HEAD WITHOUT CONTRAST MRA NECK WITHOUT AND WITH CONTRAST  Technique: Multiplanar, multiecho pulse sequences of the brain and surrounding structures were obtained according to standard protocol with and without intravenous contrast.  Angiographic images of the Circle of Willis were obtained using MRA technique without intravenous contrast.  Angiographic images of the neck were obtained using MRA technique without and with intravenous contrast.  Contrast:13 ml MultiHance.  Comparison:03/23/2011 MR.  MRI HEAD  Findings: No acute infarct.  Remote infarct posterior left lenticular nucleus.  Small vessel disease type changes.  No hydrocephalus.  Right mastoid air cell opacification once again noted.  No obstructing lesion in the posterior-superior nasopharynx detected as a cause of eustachian tube dysfunction.  Mild paranasal sinus mucosal thickening.  No intracranial mass lesion detected on this unenhanced exam.  Post radiation therapy type changes of bone marrow suspected.  IMPRESSION: No acute infarct.  Please see above.  MRA HEAD  Findings:Motion degraded exam.  This somewhat limits evaluation for detection of aneurysm or grading stenosis accurately.  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  Moderate narrowing proximal A1 segment left anterior cerebral artery and mild to moderate narrowing mid aspect  of the M1 segment of the left middle cerebral artery may be present versus result of artifact from motion.  Right vertebral artery is dominant.  Nonvisualization right PICA.  Mild narrowing proximal basilar artery.  Nonvisualization left AICA.  IMPRESSION: Motion degraded exam.  This somewhat limits evaluation for detection of aneurysm or grading stenosis accurately.  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  Moderate narrowing proximal A1 segment left anterior cerebral artery and mild to moderate narrowing mid aspect of the M1 segment of the left middle cerebral artery may be present versus result of artifact from motion.  Right vertebral artery is dominant.  Nonvisualization right PICA.  Mild narrowing proximal basilar artery.  Nonvisualization left AICA.  MRA NECK  Findings:Normal configuration origin of the great vessels.  Mild narrowing proximal left common carotid artery.  Irregularity proximal left internal carotid artery without measurable stenosis although focal plaque distal bulb level noted.  Minimal irregularity proximal right internal carotid artery without measurable stenosis.  Mild to moderate narrowing of proximal right subclavian artery.  Moderate to slightly marked stenosis proximal left vertebral artery.  Mild irregularity proximal right vertebral artery without significant narrowing.  IMPRESSION: Normal configuration origin of the great vessels.  Mild narrowing proximal left common carotid artery.  Irregularity proximal left internal carotid artery without measurable stenosis although focal plaque distal bulb level noted.  Minimal irregularity proximal right internal carotid artery without measurable stenosis.  Mild to moderate narrowing of proximal right subclavian artery.  Moderate to slightly marked stenosis proximal left vertebral artery.  Mild irregularity proximal right vertebral artery without significant narrowing.  Initial interpretation by Dr. Benard Rink at the time  imaging.  Original Report Authenticated By: Fuller Canada, M.D.   Mr Angiogram Neck W Wo Contrast  07/24/2011  *RADIOLOGY REPORT*  Clinical Data:  Episode of difficulty talking.  Remote stroke with residual left-sided weakness.  Smoker.  Diabetic.  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  MRI HEAD WITH AND WITHOUT CONTRAST MRA HEAD WITHOUT CONTRAST MRA NECK WITHOUT AND WITH CONTRAST  Technique: Multiplanar, multiecho pulse sequences of the brain and surrounding structures were obtained according to standard protocol with and without intravenous contrast.  Angiographic images of the Circle of Willis were obtained using MRA technique without intravenous contrast.  Angiographic images of the neck were obtained using MRA technique without and with intravenous contrast.  Contrast:13 ml MultiHance.  Comparison:03/23/2011 MR.  MRI HEAD  Findings: No acute infarct.  Remote infarct posterior left lenticular nucleus.  Small vessel disease type changes.  No hydrocephalus.  Right mastoid air cell opacification once again noted.  No obstructing lesion in the posterior-superior nasopharynx detected as a cause of  eustachian tube dysfunction.  Mild paranasal sinus mucosal thickening.  No intracranial mass lesion detected on this unenhanced exam.  Post radiation therapy type changes of bone marrow suspected.  IMPRESSION: No acute infarct.  Please see above.  MRA HEAD  Findings:Motion degraded exam.  This somewhat limits evaluation for detection of aneurysm or grading stenosis accurately.  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  Moderate narrowing proximal A1 segment left anterior cerebral artery and mild to moderate narrowing mid aspect of the M1 segment of the left middle cerebral artery may be present versus result of artifact from motion.  Right vertebral artery is dominant.  Nonvisualization right PICA.  Mild narrowing proximal basilar artery.  Nonvisualization left AICA.  IMPRESSION:  Motion degraded exam.  This somewhat limits evaluation for detection of aneurysm or grading stenosis accurately.  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  Moderate narrowing proximal A1 segment left anterior cerebral artery and mild to moderate narrowing mid aspect of the M1 segment of the left middle cerebral artery may be present versus result of artifact from motion.  Right vertebral artery is dominant.  Nonvisualization right PICA.  Mild narrowing proximal basilar artery.  Nonvisualization left AICA.  MRA NECK  Findings:Normal configuration origin of the great vessels.  Mild narrowing proximal left common carotid artery.  Irregularity proximal left internal carotid artery without measurable stenosis although focal plaque distal bulb level noted.  Minimal irregularity proximal right internal carotid artery without measurable stenosis.  Mild to moderate narrowing of proximal right subclavian artery.  Moderate to slightly marked stenosis proximal left vertebral artery.  Mild irregularity proximal right vertebral artery without significant narrowing.  IMPRESSION: Normal configuration origin of the great vessels.  Mild narrowing proximal left common carotid artery.  Irregularity proximal left internal carotid artery without measurable stenosis although focal plaque distal bulb level noted.  Minimal irregularity proximal right internal carotid artery without measurable stenosis.  Mild to moderate narrowing of proximal right subclavian artery.  Moderate to slightly marked stenosis proximal left vertebral artery.  Mild irregularity proximal right vertebral artery without significant narrowing.  Initial interpretation by Dr. Benard Rink at the time imaging.  Original Report Authenticated By: Fuller Canada, M.D.   Mr Brain Wo Contrast  07/24/2011  *RADIOLOGY REPORT*  Clinical Data:  Episode of difficulty talking.  Remote stroke with residual left-sided weakness.  Smoker.  Diabetic.  History of base of  tongue cancer post radiation therapy.  MRI HEAD WITH AND WITHOUT CONTRAST MRA HEAD WITHOUT CONTRAST MRA NECK WITHOUT AND WITH CONTRAST  Technique: Multiplanar, multiecho pulse sequences of the brain and surrounding structures were obtained according to standard protocol with and without intravenous contrast.  Angiographic images of the Circle of Willis were obtained using MRA technique without intravenous contrast.  Angiographic images of the neck were obtained using MRA technique without and with intravenous contrast.  Contrast:13 ml MultiHance.  Comparison:03/23/2011 MR.  MRI HEAD  Findings: No acute infarct.  Remote infarct posterior left lenticular nucleus.  Small vessel disease type changes.  No hydrocephalus.  Right mastoid air cell opacification once again noted.  No obstructing lesion in the posterior-superior nasopharynx detected as a cause of eustachian tube dysfunction.  Mild paranasal sinus mucosal thickening.  No intracranial mass lesion detected on this unenhanced exam.  Post radiation therapy type changes of bone marrow suspected.  IMPRESSION: No acute infarct.  Please see above.  MRA HEAD  Findings:Motion degraded exam.  This somewhat limits evaluation for detection of aneurysm  or grading stenosis accurately.  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  Moderate narrowing proximal A1 segment left anterior cerebral artery and mild to moderate narrowing mid aspect of the M1 segment of the left middle cerebral artery may be present versus result of artifact from motion.  Right vertebral artery is dominant.  Nonvisualization right PICA.  Mild narrowing proximal basilar artery.  Nonvisualization left AICA.  IMPRESSION: Motion degraded exam.  This somewhat limits evaluation for detection of aneurysm or grading stenosis accurately.  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  Moderate narrowing proximal A1 segment left anterior cerebral artery and mild to moderate narrowing  mid aspect of the M1 segment of the left middle cerebral artery may be present versus result of artifact from motion.  Right vertebral artery is dominant.  Nonvisualization right PICA.  Mild narrowing proximal basilar artery.  Nonvisualization left AICA.  MRA NECK  Findings:Normal configuration origin of the great vessels.  Mild narrowing proximal left common carotid artery.  Irregularity proximal left internal carotid artery without measurable stenosis although focal plaque distal bulb level noted.  Minimal irregularity proximal right internal carotid artery without measurable stenosis.  Mild to moderate narrowing of proximal right subclavian artery.  Moderate to slightly marked stenosis proximal left vertebral artery.  Mild irregularity proximal right vertebral artery without significant narrowing.  IMPRESSION: Normal configuration origin of the great vessels.  Mild narrowing proximal left common carotid artery.  Irregularity proximal left internal carotid artery without measurable stenosis although focal plaque distal bulb level noted.  Minimal irregularity proximal right internal carotid artery without measurable stenosis.  Mild to moderate narrowing of proximal right subclavian artery.  Moderate to slightly marked stenosis proximal left vertebral artery.  Mild irregularity proximal right vertebral artery without significant narrowing.  Initial interpretation by Dr. Benard Rink at the time imaging.  Original Report Authenticated By: Fuller Canada, M.D.    Brief H and P: Patient is a 61 year old white female with past mental history hyperlipidemia, diabetes mellitus, tobacco abuse and previous CVA with left-sided residual weakness who today in suddenly found herself in the late morning unable to speak or write. She did not lose consciousness and was able to understand what was going on and communicated which was going through to her husband. The immediately got her in the car and headed over to the emergency  room. On route, the patient's symptoms seemed to slowly resolve and by the time that she arrived at the emergency room they had completely resolved. Given the patient's symptoms, this is concerning for a code stroke. No TPA was needed because of resolution of symptoms. CT scan of the head was done which noted only her old CVA. MRI/MRA was done and results are currently pending.   Physical Exam on Discharge:  Filed Vitals:   07/25/11 1855 07/25/11 2220 07/26/11 0238 07/26/11 0700  BP: 118/65 123/72 114/76 110/70  Pulse: 65 68 84 61  Temp: 98.1 F (36.7 C) 98.2 F (36.8 C) 98.4 F (36.9 C) 98.3 F (36.8 C)  TempSrc: Oral Oral Oral Oral  Resp: 20 20 18 18   Height:      Weight:      SpO2: 98% 97% 94% 98%     Intake/Output Summary (Last 24 hours) at 07/26/11 0731 Last data filed at 07/25/11 1000  Gross per 24 hour  Intake    476 ml  Output      0 ml  Net    476 ml  General: Alert, awake, oriented x3, in no acute distress. HEENT: No bruits, no goiter. Heart: Regular rate and rhythm, without murmurs, rubs, gallops. Lungs: Clear to auscultation bilaterally. Abdomen: Soft, nontender, nondistended, positive bowel sounds. Extremities: No clubbing cyanosis or edema with positive pedal pulses. Neuro: Grossly intact, nonfocal.  CBC:    Component Value Date/Time   WBC 5.1 07/26/2011 0623   WBC 4.8 02/27/2011 1011   HGB 14.9 07/26/2011 0623   HGB 13.9 02/27/2011 1011   HCT 43.2 07/26/2011 0623   HCT 41.2 02/27/2011 1011   PLT 167 07/26/2011 0623   PLT 165 02/27/2011 1011   MCV 89.4 07/26/2011 0623   MCV 90.5 02/27/2011 1011   NEUTROABS 3.3 07/23/2011 1905   NEUTROABS 3.8 02/27/2011 1011   LYMPHSABS 0.8 07/23/2011 1905   LYMPHSABS 0.8* 02/27/2011 1011   MONOABS 0.3 07/23/2011 1905   MONOABS 0.3 02/27/2011 1011   EOSABS 0.1 07/23/2011 1905   EOSABS 0.0 02/27/2011 1011   BASOSABS 0.1 07/23/2011 1905   BASOSABS 0.0 02/27/2011 1011    Basic Metabolic Panel:    Component Value Date/Time    NA 135 07/25/2011 0616   K 4.6 07/25/2011 0616   CL 100 07/25/2011 0616   CO2 28 07/25/2011 0616   BUN 14 07/25/2011 0616   CREATININE 0.83 07/25/2011 0616   GLUCOSE 110* 07/25/2011 0616   CALCIUM 9.9 07/25/2011 0616    Hospital Course:   Principal Problem:  *TIA (transient ischemic attack)  - stroke work negative, pt initially presented with weakness and was thought to be secondary to stroke - pt's symptoms resolved and PT recommendation was that pt can go home without further needs - PT evaluation is warranted per stroke protocol guidlines - PT/OT/ST evaluation recommendation was to go home with PT - for now will continue Plavix - TEE in negative  Active Problems:  DM (diabetes mellitus), type 2, uncontrolled  - A1C elevated  - continue insulin and metformin   History of CVA (cerebrovascular accident)  PT to continue at home  Hyperlipemia  - continue statin   Tobacco abuse  - cessation consult provided   EDUCATION  - test results and diagnostic studies were discussed with patient  - patient verbalized the understanding  - questions were answered at the bedside and contact information was provided for additional questions or concerns   Time spent on Discharge: Over 30 minutes  Signed: Debbora Presto 07/26/2011, 7:31 AM  [phone]

## 2011-07-26 NOTE — Op Note (Signed)
[facility]  TRANSESOPHAGEAL ECHOCARDIOGRAM (TEE) NOTE   INDICATIONS: cryptogenic stroke  PROCEDURE:   Informed consent was obtained prior to the procedure. The risks, benefits and alternatives for the procedure were discussed and the patient comprehended these risks.  Risks include, but are not limited to, cough, sore throat, vomiting, nausea, somnolence, esophageal and stomach trauma or perforation, bleeding, low blood pressure, aspiration, pneumonia, infection, trauma to the teeth and death.    After a procedural time-out, the patient was given 3 mg versed and 50 mcg fentanyl for moderate sedation.  The oropharynx was anesthetized 10 cc of topical 1% viscous lidocaine.  The transesophageal probe was inserted in the esophagus and stomach without difficulty and multiple views were obtained.  The patient was kept under observation until the patient left the procedure room.  The patient left the procedure room in stable condition.   Agitated microbubble saline contrast was administered.  COMPLICATIONS:    There were no immediate complications.  FINDINGS:  1. LEFT VENTRICLE: The left ventricle is normal in structure and function without any thrombus or masses. Normal LV wall thickness. EF >55%.  2. RIGHT VENTRICLE:  The right ventricle is normal in structure and function without any thrombus or masses.    3. LEFT ATRIUM:  The left atrium is normal without any thrombus or masses. No smoke.  4. LEFT ATRIAL APPENDAGE:  The left atrial appendage is free of any thrombus or masses. Small appendage.  5. ATRIAL SEPTUM:  The atrial septum is aneurysmal. There is a very small PFO seen by saline microbubble only with increased venous return by liver palpation. There is no evidence for left to right flow by color doppler.   6. RIGHT ATRIUM:  The right atrium is normal in size and function without any thrombus or masses.  7. MITRAL VALVE:  The mitral valve is notable  for mild redundancy of the anterior leaflet. Trivial MR.  There were no vegetations or stenosis.  8. AORTIC VALVE:  The aortic valve is normal in structure and function without regurgitation.  There were no vegetations or stenosis  9. TRICUSPID VALVE:  The tricuspid valve is normal in structure and function with trace regurgitation.  There were no vegetations or stenosis.  10. PULMONIC VALVE:  The pulmonic valve is normal in structure and function without regurgitation.  There were no vegetations or stenosis  11. AORTIC ARCH, ASCENDING AND DESCENDING AORTA:  There is a small amount (Grade 1) of atherosclerosis in the proximal descending aorta.  IMPRESSION:   1.  Very small PFO noted only with augmented venous return. Aneurysmal interatrial septum. 2. Small amount of atherosclerosis in the proximal descending aorta.  RECOMMENDATIONS:    1.  Stroke management per neurology.  Time Spent Directly with the Patient:  30 minutes   Chrystie Nose, MD, [facility] Attending Cardiologist [facility]  07/26/2011, 11:12 AM

## 2011-07-26 NOTE — H&P (View-Only) (Signed)
Ms. Moates is scheduled for TEE 07/26/11 at 1100 with Dr. Hilty. She will be NPO after MN. 

## 2011-07-26 NOTE — Discharge Instructions (Signed)
STROKE/TIA DISCHARGE INSTRUCTIONS SMOKING Cigarette smoking nearly doubles your risk of having a stroke & is the single most alterable risk factor  If you smoke or have smoked in the last 12 months, you are advised to quit smoking for your health.  Most of the excess cardiovascular risk related to smoking disappears within a year of stopping.  Ask you doctor about anti-smoking medications  [place] Quit Line: 1-800-QUIT NOW  Free Smoking Cessation Classes ([phone]  CHOLESTEROL Know your levels; limit fat & cholesterol in your diet  Lipid Panel  No results found for this basename: chol, trig, hdl, cholhdl, vldl, ldlcalc      Many patients benefit from treatment even if their cholesterol is at goal.  Goal: Total Cholesterol (CHOL) less than 160  Goal:  Triglycerides (TRIG) less than 150  Goal:  HDL greater than 40  Goal:  LDL (LDLCALC) less than 100   BLOOD PRESSURE American Stroke Association blood pressure target is less that 120/80 mm/Hg  Your discharge blood pressure is:  BP: 108/68 mmHg  Monitor your blood pressure  Limit your salt and alcohol intake  Many individuals will require more than one medication for high blood pressure  DIABETES (A1c is a blood sugar average for last 3 months) Goal HGBA1c is under 7% (HBGA1c is blood sugar average for last 3 months)  Diabetes:    Your HGBA1c can be lowered with medications, healthy diet, and exercise.  Check your blood sugar as directed by your physician  Call your physician if you experience unexplained or low blood sugars.  PHYSICAL ACTIVITY/REHABILITATION Goal is 30 minutes at least 4 days per week      Activity decreases your risk of heart attack and stroke and makes your heart stronger.  It helps control your weight and blood pressure; helps you relax and can improve your mood.  Participate in a regular exercise program.  Talk with your doctor about the best form of exercise for you (dancing, walking, swimming,  cycling).  DIET/WEIGHT Goal is to maintain a healthy weight  Your discharge diet is: Cardiac  liquids Your height is:  Height: 5\' 5"  (165.1 cm) Your current weight is: Weight: 58.968 kg (130 lb) Your Body Mass Index (BMI) is:  BMI (Calculated): 21.7   Following the type of diet specifically designed for you will help prevent another stroke.  Your goal weight range is:    Your goal Body Mass Index (BMI) is 19-24.  Healthy food habits can help reduce 3 risk factors for stroke:  High cholesterol, hypertension, and excess weight.  RESOURCES Stroke/Support Group:  Call [phone]  they meet the 3rd Sunday of the month on the Rehab Unit at [facility], [place] ( no meetings June, July & Aug).  STROKE EDUCATION PROVIDED/REVIEWED AND GIVEN TO PATIENT Stroke warning signs and symptoms How to activate emergency medical system (call 911). Medications prescribed at discharge. Need for follow-up after discharge. Personal risk factors for stroke.  My questions have been answered, the writing is legible, and I understand these instructions.  I will adhere to these goals & educational materials that have been provided to me after my discharge from the hospital.

## 2011-07-26 NOTE — Progress Notes (Signed)
*  PRELIMINARY RESULTS* Echocardiogram 2D Echocardiogram has been performed.  Clide Deutscher 07/26/2011, 12:17 PM

## 2011-07-26 NOTE — Interval H&P Note (Signed)
History and Physical Interval Note:  07/26/2011 11:08 AM  Brenda [name]  has presented today for surgery, with the diagnosis of a fib/stoke  The various methods of treatment have been discussed with the patient and family. After consideration of risks, benefits and other options for treatment, the patient has consented to  Procedure(s) (LRB): TRANSESOPHAGEAL ECHOCARDIOGRAM (TEE) (N/A) as a surgical intervention .  The patients' history has been reviewed, patient examined, no change in status, stable for surgery.  I have reviewed the patients' chart and labs.  Questions were answered to the patient's satisfaction.     Elberta Leatherwood, MD, [facility] Attending Cardiologist [facility]

## 2011-07-26 NOTE — Consult Note (Signed)
Pt says she's quit 6-7 times before. At this time she enjoys smoking and has no plans to quit. Discussed risk factors and pt verbalizes understanding. She does ask for contact info for future interest which was given to the pt.

## 2011-07-29 ENCOUNTER — Encounter (HOSPITAL_COMMUNITY): Payer: Self-pay | Admitting: Internal Medicine

## 2011-08-12 ENCOUNTER — Encounter: Payer: Self-pay | Admitting: *Deleted

## 2011-08-20 ENCOUNTER — Other Ambulatory Visit (HOSPITAL_BASED_OUTPATIENT_CLINIC_OR_DEPARTMENT_OTHER): Payer: Medicare Other | Admitting: Lab

## 2011-08-20 ENCOUNTER — Ambulatory Visit (HOSPITAL_COMMUNITY)
Admission: RE | Admit: 2011-08-20 | Discharge: 2011-08-20 | Disposition: A | Discharge status: Home / Self Care | Payer: Medicare Other | Source: Ambulatory Visit | Attending: Oncology | Admitting: Oncology

## 2011-08-20 DIAGNOSIS — C01 Malignant neoplasm of base of tongue: Secondary | ICD-10-CM

## 2011-08-20 DIAGNOSIS — I2510 Atherosclerotic heart disease of native coronary artery without angina pectoris: Secondary | ICD-10-CM | POA: Insufficient documentation

## 2011-08-20 DIAGNOSIS — J438 Other emphysema: Secondary | ICD-10-CM | POA: Insufficient documentation

## 2011-08-20 DIAGNOSIS — C760 Malignant neoplasm of head, face and neck: Secondary | ICD-10-CM | POA: Insufficient documentation

## 2011-08-20 LAB — CBC WITH DIFFERENTIAL/PLATELET
BASO%: 1.1 % (ref 0.0–2.0)
EOS%: 2.5 % (ref 0.0–7.0)
HGB: 15.1 g/dL (ref 11.6–15.9)
MCH: 30.9 pg (ref 25.1–34.0)
MCHC: 34.6 g/dL (ref 31.5–36.0)
MCV: 89.4 fL (ref 79.5–101.0)
MONO%: 5.2 % (ref 0.0–14.0)
RBC: 4.89 10*6/uL (ref 3.70–5.45)
RDW: 14.3 % (ref 11.2–14.5)
lymph#: 0.6 10*3/uL — ABNORMAL LOW (ref 0.9–3.3)

## 2011-08-20 LAB — CMP (CANCER CENTER ONLY)
ALT(SGPT): 30 U/L (ref 10–47)
AST: 24 U/L (ref 11–38)
Albumin: 3.6 g/dL (ref 3.3–5.5)
Alkaline Phosphatase: 112 U/L — ABNORMAL HIGH (ref 26–84)
BUN, Bld: 11 mg/dL (ref 7–22)
Calcium: 9.1 mg/dL (ref 8.0–10.3)
Chloride: 90 mEq/L — ABNORMAL LOW (ref 98–108)
Potassium: 4.1 mEq/L (ref 3.3–4.7)
Sodium: 139 mEq/L (ref 128–145)
Total Protein: 6.9 g/dL (ref 6.4–8.1)

## 2011-08-20 MED ORDER — IOHEXOL 300 MG/ML  SOLN
80.0000 mL | Freq: Once | INTRAMUSCULAR | Status: AC | PRN
  Administered 2011-08-20: 80 mL via INTRAVENOUS

## 2011-08-21 ENCOUNTER — Telehealth: Payer: Self-pay | Admitting: Oncology

## 2011-08-21 ENCOUNTER — Ambulatory Visit (HOSPITAL_BASED_OUTPATIENT_CLINIC_OR_DEPARTMENT_OTHER): Payer: Medicare Other | Admitting: Oncology

## 2011-08-21 VITALS — BP 128/72 | HR 73 | Temp 96.6°F | Ht 65.0 in | Wt 133.6 lb

## 2011-08-21 DIAGNOSIS — F17200 Nicotine dependence, unspecified, uncomplicated: Secondary | ICD-10-CM

## 2011-08-21 DIAGNOSIS — E1165 Type 2 diabetes mellitus with hyperglycemia: Secondary | ICD-10-CM

## 2011-08-21 DIAGNOSIS — E119 Type 2 diabetes mellitus without complications: Secondary | ICD-10-CM

## 2011-08-21 DIAGNOSIS — Z794 Long term (current) use of insulin: Secondary | ICD-10-CM

## 2011-08-21 DIAGNOSIS — Z85819 Personal history of malignant neoplasm of unspecified site of lip, oral cavity, and pharynx: Secondary | ICD-10-CM

## 2011-08-21 DIAGNOSIS — Z8673 Personal history of transient ischemic attack (TIA), and cerebral infarction without residual deficits: Secondary | ICD-10-CM

## 2011-08-21 DIAGNOSIS — C029 Malignant neoplasm of tongue, unspecified: Secondary | ICD-10-CM

## 2011-08-21 NOTE — Telephone Encounter (Signed)
gve the pt her sept,march 2013,2014 appt calendar

## 2011-08-21 NOTE — Patient Instructions (Addendum)
1.  Result of CT chest 08/20/2011:  Near complete resolution of the right upper lobe opacity,  likely post infectious or inflammatory.  Moderate centrilobular emphysema, without acute finding or  evidence of metastatic disease.    Age advanced coronary artery atherosclerosis.   2.  Head/Neck cancer in right base of the tongue:  No evidence of recurrence.   Please keep your appointments with Radiation Oncologist, Ear/Nose/Throat.   3. Please STOP smoking.  Smoking significantly increases your risk of recurrent cancer.  You already have cardiac attack in the past, along with diabetes, high blood pressure, emphysema.  A scheduler will call you with appointments for smoking cessation class and psychiatrist.    4.  Follow up with Dr. Lodema Pilot nurse practitioner in about 6 months and with Dr. Gaylyn Rong in about 12 months.

## 2011-08-21 NOTE — Progress Notes (Signed)
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  PAST THERAPY:  Concurrent definitive weekly cetuximab with daily radiation therapy finished on 03/07/2009.  CURRENT THERAPY:  watchful observation.  INTERVAL HISTORY: Brenda [name] 61 y.o. female returns to clinic by herself for evaluation.  She was admitted last month for TIA.  Unfortunately, she still smokes about 1 pack of cigarettes a day.  Once she gave up smoking in the past for about 4 or 5 months, she felt miserable. She said that smoking cigarettes makes her feel "better."  Being off of smoking, she felt drained of energy and no drive to do anything.  As soon as she starts smoking again, all these symptoms resolvesand she feels at baseline. Her husband and children smoked inside the house. She has a grandchild who is about 102 years old and lives with her in the household who still smoking cigarettes.  She does have palpitation and dyspnea exertion on walking upstairs or ambulation.  She does have occasional exertional chest pain which resolves with sublingual nitroglycerin  She has xerostomia; however, she denies odynophagia, dysphagia, power lymph node swelling. She does not have a submental fibrotic changes or edema.  Patient denies fatigue, headache, visual changes, confusion, drenching night sweats, palpable lymph node swelling, mucositis, odynophagia, dysphagia, nausea vomiting, jaundice, productive cough, gum bleeding, epistaxis, hematemesis, hemoptysis, abdominal pain, abdominal swelling, early satiety, melena, hematochezia, hematuria, skin rash, spontaneous bleeding, joint swelling, joint pain, heat or cold intolerance, bowel bladder incontinence, back pain, focal motor weakness, paresthesia, depression, suicidal or homocidal ideation, feeling hopelessness.   Past Medical History  Diagnosis Date  . Diabetes  mellitus   . Cancer of base of tongue   . History of DVT (deep vein thrombosis)   . Myocardial infarction   . Coronary artery disease   . Stroke 2000  . History of pneumonia   . History of radiation therapy 03/06/2009    right base of tongue primary and right neck node 7000 cGy 35 sessions    Past Surgical History  Procedure Date  . Coronary stent placement   . Vascular bypass surgery     lower extremities  . Appendectomy   . Tee without cardioversion 07/26/2011    Procedure: TRANSESOPHAGEAL ECHOCARDIOGRAM (TEE);  Surgeon: Chrystie Nose, MD;  Location: [facility] ENDOSCOPY;  Service: Cardiovascular;  Laterality: N/A;    Current Outpatient Prescriptions  Medication Sig Dispense Refill  . acetaminophen (TYLENOL) 325 MG tablet Take 650 mg by mouth every 6 (six) hours as needed. For pain      . aspirin 81 MG tablet Take 81 mg by mouth daily.        . calcium carbonate (OS-CAL) 600 MG TABS Take 600 mg by mouth 2 (two) times daily with a meal.        . cholecalciferol (VITAMIN D) 1000 UNITS tablet Take 1,000 Units by mouth daily.        . clopidogrel (PLAVIX) 75 MG tablet Take 75 mg by mouth daily.        . fish oil-omega-3 fatty acids 1000 MG capsule Take 2 g by mouth daily.       . insulin glargine (LANTUS) 100 UNIT/ML injection Inject 5 Units into the skin at bedtime.      . insulin glulisine (APIDRA) 100 UNIT/ML injection Inject 4 Units into the skin 3 (three) times daily as  needed. For blood sugar levels      . metFORMIN (GLUCOPHAGE) 500 MG tablet Take 1,000 mg by mouth 2 (two) times daily with a meal.       . omeprazole (PRILOSEC) 20 MG capsule Take 20 mg by mouth daily.        . rosuvastatin (CRESTOR) 10 MG tablet Take 10 mg by mouth daily.          ALLERGIES:  is allergic to tape.  REVIEW OF SYSTEMS:  The rest of the 14-point review of system was negative.   Filed Vitals:   08/21/11 1207  BP: 128/72  Pulse: 73  Temp: 96.6 F (35.9 C)   Wt Readings from Last 3 Encounters:    08/21/11 133 lb 9.6 oz (60.601 kg)  02/27/11 132 lb (59.875 kg)  07/24/11 130 lb (58.968 kg)   ECOG Performance status: 1  PHYSICAL EXAMINATION:   General:  Thin-appearing woman in no acute distress.  Eyes:  no scleral icterus.  ENT:  There were no oropharyngeal lesions.  Neck was without thyromegaly.  Lymphatics:  Negative cervical, supraclavicular or axillary adenopathy.  Respiratory: lungs were clear bilaterally without wheezing or crackles.  Cardiovascular:  Regular rate and rhythm, S1/S2, without murmur, rub or gallop.  There was no pedal edema.  GI:  abdomen was soft, flat, nontender, nondistended, without organomegaly.  Muscoloskeletal:  no spinal tenderness of palpation of vertebral spine.  Skin exam was without echymosis, petichae.  Neuro exam was nonfocal.  Patient was able to get on and off exam table without assistance.  Gait was normal.  Patient was alerted and oriented.  Attention was good.   Language was appropriate.  Mood was normal without depression.  Speech was not pressured.  Thought content was not tangential.      LABORATORY/RADIOLOGY DATA:  Lab Results  Component Value Date   WBC 4.5 08/20/2011   HGB 15.1 08/20/2011   HCT 43.7 08/20/2011   PLT 177 08/20/2011   GLUCOSE 261* 08/20/2011   CHOL 183 07/24/2011   TRIG 162* 07/24/2011   HDL 53 07/24/2011   LDLCALC 98 07/24/2011   ALT 15 07/23/2011   AST 24 08/20/2011   NA 139 08/20/2011   K 4.1 08/20/2011   CL 90* 08/20/2011   CREATININE 0.7 08/20/2011   BUN 11 08/20/2011   CO2 31 08/20/2011   TSH 2.876 08/20/2011   INR 0.96 07/26/2011   HGBA1C 9.5* 07/23/2011    Ct Chest W Contrast  08/20/2011  *RADIOLOGY REPORT*  Clinical Data: Head and neck cancer.  Evaluate lung nodule. Chemotherapy and radiation therapy complete.  Cough and shortness of breath.  CT CHEST WITH CONTRAST  Technique:  Multidetector CT imaging of the chest was performed following the standard protocol during bolus administration of intravenous contrast.   Contrast: 80mL OMNIPAQUE IOHEXOL 300 MG/ML IJ SOLN  Comparison: 03/08/2011 PET.  Chest CT of 03/01/2011.  Findings: Lung windows demonstrate moderate centrilobular emphysema. The irregular subpleural right upper lobe pulmonary opacity is decreased today.  Only minimal linear opacity remains on image 12.  2 mm subpleural right upper lobe nodule on image 24 is unchanged, and likely a subpleural lymph node.  Soft tissue windows demonstrate moderate great vessel atherosclerosis. Normal heart size without pericardial or pleural effusion.  Multivessel coronary artery atherosclerosis.  No central pulmonary embolism, on this non-dedicated study.  Precarinal node measures 7 mm on image 22 and is unchanged. Right infrahilar node measures 6 mm on image 32 versus 7 mm on  the prior.  Neither of these is pathologic by size criteria.  No left hilar adenopathy.  Limited abdominal imaging demonstrates no significant findings. Normal adrenal glands.  Mild osteopenia.  IMPRESSION:  1.  Near complete resolution of the right upper lobe opacity, likely post infectious or inflammatory. 2.  Moderate centrilobular emphysema, without acute finding or evidence of metastatic disease. 3. Age advanced coronary artery atherosclerosis.  Recommend assessment of coronary risk factors and consideration of medical therapy.  Original Report Authenticated By: Consuello Bossier, M.D.   ASSESSMENT AND PLAN:    1. History of oropharyngeal squamous cell carcinoma:  Clinically, there is no evidence of recurrent disease or metastases. I discussed with her that she is at very high the risk of recurrent cancer because of her smoking. I encouraged her to follow up with ENT and radiation oncology between visits with me.  As she is 2 years out from the finish the chemoradiation for head and neck cancer, there is no indication for routine surveillance CT unless she is any clinical finding which there is none today. 2. Smoking cessation: Referred to smoking  cessation class.  I discussed with her that I have concern for possible depression since she felt miserable when she does not smoke. I referred to Dr. Hiram Gash for questions or possibly of depressions and management. In the past she was on Wellbutrin without improvement of her mood or decrease in her craving for smoking. 3. Diabetes mellitus, type II:  She is on metformin and insulin per PCP. 4. Hypercholesterolemia:  On rosuvastatin per PCP. 5. History of COPD:  On inhalers per PCP.  I again stressed smoking cessation 6. History of coronary artery disease:  She is on Plavix, aspirin, rosuvastatin.  For reason unbeknown to be, she is not on ACE inhibitor or beta-blocker.  I will defer this to PCP.  I again stressed smoking cessation. 7. History of CVA:  On aspirin and Plavix.   I again stressed smoking cessation. 8. Hypertension:  Well controlled on diet control only. 9. Follow up:  Return in about 6 months.    The length of time of the face-to-face encounter was 25 minutes. More than 50% of time was spent counseling and coordination of care.

## 2011-08-22 ENCOUNTER — Telehealth: Payer: Self-pay | Admitting: *Deleted

## 2011-08-22 NOTE — Patient Instructions (Signed)
Brenda [name] Called patient per Dr. Lodema Pilot referral for smoking Cessation. No one answered phone. Left message for patient to call back.

## 2011-08-22 NOTE — Progress Notes (Deleted)
Chart opened in error

## 2011-08-23 ENCOUNTER — Telehealth: Payer: Self-pay | Admitting: *Deleted

## 2011-08-23 NOTE — Telephone Encounter (Signed)
Message copied by Priscille Heidelberg on Fri Aug 23, 2011  9:44 AM ------      Message from: HA, Raliegh Ip T      Created: Wed Aug 21, 2011 12:40 PM       Please arrange for this patient to attend smoking cessation class.             Thanks.

## 2011-08-23 NOTE — Telephone Encounter (Signed)
Opened in error

## 2011-08-23 NOTE — Progress Notes (Signed)
Patient referred by Dr. Gaylyn Rong to smoking cessation classes. Danelle Berry Smoking Cessation class facilitator called patient 08/22/11 to let patient know about our smoking cessation program at the [facility]. No one answered phone. Message was left for patient to call Sheneka back.

## 2011-08-28 ENCOUNTER — Ambulatory Visit: Payer: Medicare Other | Admitting: Psychiatry

## 2011-09-18 ENCOUNTER — Ambulatory Visit (INDEPENDENT_AMBULATORY_CARE_PROVIDER_SITE_OTHER): Payer: 59 | Admitting: Psychiatry

## 2011-09-18 DIAGNOSIS — F17200 Nicotine dependence, unspecified, uncomplicated: Secondary | ICD-10-CM

## 2011-09-18 DIAGNOSIS — F0630 Mood disorder due to known physiological condition, unspecified: Secondary | ICD-10-CM

## 2011-09-18 NOTE — Progress Notes (Signed)
09-18-2011  Patient seen for initial psychological evaluation.  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  Recommended she ask her primary MD for antidepressants to help her with withdrawal depressive symptoms.  Encouraged her to use electric cigarette, join Smoking Cessation Classes, and continue in therapy to support her while she stops smoking and deals with cancer issues.  Next appointment 10-01-2011.

## 2011-09-19 ENCOUNTER — Other Ambulatory Visit: Payer: Self-pay | Admitting: Endocrinology

## 2011-09-19 DIAGNOSIS — Z1231 Encounter for screening mammogram for malignant neoplasm of breast: Secondary | ICD-10-CM

## 2011-10-01 ENCOUNTER — Ambulatory Visit (INDEPENDENT_AMBULATORY_CARE_PROVIDER_SITE_OTHER): Payer: 59 | Admitting: Psychiatry

## 2011-10-01 DIAGNOSIS — F17200 Nicotine dependence, unspecified, uncomplicated: Secondary | ICD-10-CM

## 2011-10-01 DIAGNOSIS — F0630 Mood disorder due to known physiological condition, unspecified: Secondary | ICD-10-CM

## 2011-10-01 NOTE — Progress Notes (Signed)
10-01-2011  Patient seen for individual psychotherapy.  Session focused on patient's desire to quit smoking and her problems with withdrawal symptoms that last 6 months or more.  Consulted with her primary physician, Dr. [name], for antidepressants that might help her with these symptoms. Patient WC for next appointment.

## 2011-10-02 ENCOUNTER — Ambulatory Visit
Admission: RE | Admit: 2011-10-02 | Discharge: 2011-10-02 | Disposition: A | Discharge status: Home / Self Care | Payer: Medicare Other | Source: Ambulatory Visit | Attending: Endocrinology | Admitting: Endocrinology

## 2011-10-02 DIAGNOSIS — Z1231 Encounter for screening mammogram for malignant neoplasm of breast: Secondary | ICD-10-CM

## 2011-10-09 ENCOUNTER — Other Ambulatory Visit: Payer: Self-pay | Admitting: Ophthalmology

## 2011-11-27 ENCOUNTER — Other Ambulatory Visit: Payer: Self-pay | Admitting: Otolaryngology

## 2011-11-27 DIAGNOSIS — R1314 Dysphagia, pharyngoesophageal phase: Secondary | ICD-10-CM

## 2011-11-27 NOTE — Progress Notes (Signed)
Received office notes from Dr. Kris Hartmann @ [facility] ENT; forwarded to Dr. Gaylyn Rong.

## 2011-12-10 ENCOUNTER — Ambulatory Visit
Admission: RE | Admit: 2011-12-10 | Discharge: 2011-12-10 | Disposition: A | Discharge status: Home / Self Care | Payer: Medicare Other | Source: Ambulatory Visit | Attending: Otolaryngology | Admitting: Otolaryngology

## 2011-12-10 DIAGNOSIS — R1314 Dysphagia, pharyngoesophageal phase: Secondary | ICD-10-CM

## 2011-12-17 NOTE — Progress Notes (Signed)
Received office notes from Dr. Patrick Hung @ [facility]; forwarded to Dr. Ha. 

## 2011-12-20 ENCOUNTER — Ambulatory Visit (HOSPITAL_COMMUNITY)
Admission: RE | Admit: 2011-12-20 | Discharge: 2011-12-20 | Disposition: A | Discharge status: Home / Self Care | Payer: Medicare Other | Source: Ambulatory Visit | Attending: Gastroenterology | Admitting: Gastroenterology

## 2011-12-20 ENCOUNTER — Encounter (HOSPITAL_COMMUNITY): Payer: Self-pay

## 2011-12-20 ENCOUNTER — Encounter (HOSPITAL_COMMUNITY)
Admission: RE | Disposition: A | Discharge status: Home / Self Care | Payer: Self-pay | Source: Ambulatory Visit | Attending: Gastroenterology

## 2011-12-20 DIAGNOSIS — R1310 Dysphagia, unspecified: Secondary | ICD-10-CM | POA: Insufficient documentation

## 2011-12-20 DIAGNOSIS — I2510 Atherosclerotic heart disease of native coronary artery without angina pectoris: Secondary | ICD-10-CM | POA: Insufficient documentation

## 2011-12-20 DIAGNOSIS — F17200 Nicotine dependence, unspecified, uncomplicated: Secondary | ICD-10-CM | POA: Insufficient documentation

## 2011-12-20 DIAGNOSIS — J449 Chronic obstructive pulmonary disease, unspecified: Secondary | ICD-10-CM | POA: Insufficient documentation

## 2011-12-20 DIAGNOSIS — Z923 Personal history of irradiation: Secondary | ICD-10-CM | POA: Insufficient documentation

## 2011-12-20 DIAGNOSIS — R933 Abnormal findings on diagnostic imaging of other parts of digestive tract: Secondary | ICD-10-CM | POA: Insufficient documentation

## 2011-12-20 DIAGNOSIS — Z85810 Personal history of malignant neoplasm of tongue: Secondary | ICD-10-CM | POA: Insufficient documentation

## 2011-12-20 DIAGNOSIS — K269 Duodenal ulcer, unspecified as acute or chronic, without hemorrhage or perforation: Secondary | ICD-10-CM | POA: Insufficient documentation

## 2011-12-20 DIAGNOSIS — J4489 Other specified chronic obstructive pulmonary disease: Secondary | ICD-10-CM | POA: Insufficient documentation

## 2011-12-20 DIAGNOSIS — R29898 Other symptoms and signs involving the musculoskeletal system: Secondary | ICD-10-CM | POA: Insufficient documentation

## 2011-12-20 DIAGNOSIS — I252 Old myocardial infarction: Secondary | ICD-10-CM | POA: Insufficient documentation

## 2011-12-20 DIAGNOSIS — Z86718 Personal history of other venous thrombosis and embolism: Secondary | ICD-10-CM | POA: Insufficient documentation

## 2011-12-20 DIAGNOSIS — K2970 Gastritis, unspecified, without bleeding: Secondary | ICD-10-CM | POA: Insufficient documentation

## 2011-12-20 DIAGNOSIS — K259 Gastric ulcer, unspecified as acute or chronic, without hemorrhage or perforation: Secondary | ICD-10-CM | POA: Insufficient documentation

## 2011-12-20 DIAGNOSIS — I69998 Other sequelae following unspecified cerebrovascular disease: Secondary | ICD-10-CM | POA: Insufficient documentation

## 2011-12-20 DIAGNOSIS — E119 Type 2 diabetes mellitus without complications: Secondary | ICD-10-CM | POA: Insufficient documentation

## 2011-12-20 HISTORY — PX: SAVORY DILATION: SHX5439

## 2011-12-20 HISTORY — DX: Depression, unspecified: F32.A

## 2011-12-20 HISTORY — PX: ESOPHAGOGASTRODUODENOSCOPY: SHX5428

## 2011-12-20 HISTORY — DX: Major depressive disorder, single episode, unspecified: F32.9

## 2011-12-20 HISTORY — DX: Unspecified osteoarthritis, unspecified site: M19.90

## 2011-12-20 SURGERY — EGD (ESOPHAGOGASTRODUODENOSCOPY)
Anesthesia: Moderate Sedation

## 2011-12-20 MED ORDER — FENTANYL CITRATE 0.05 MG/ML IJ SOLN
INTRAMUSCULAR | Status: AC
  Filled 2011-12-20: qty 2

## 2011-12-20 MED ORDER — FENTANYL CITRATE 0.05 MG/ML IJ SOLN
INTRAMUSCULAR | Status: DC | PRN
  Administered 2011-12-20: 10 ug via INTRAVENOUS
  Administered 2011-12-20: 25 ug via INTRAVENOUS

## 2011-12-20 MED ORDER — MIDAZOLAM HCL 10 MG/2ML IJ SOLN
INTRAMUSCULAR | Status: DC | PRN
  Administered 2011-12-20: 1 mg via INTRAVENOUS
  Administered 2011-12-20: 2 mg via INTRAVENOUS

## 2011-12-20 MED ORDER — SODIUM CHLORIDE 0.9 % IV SOLN
Freq: Once | INTRAVENOUS | Status: AC
  Administered 2011-12-20: 500 mL via INTRAVENOUS

## 2011-12-20 MED ORDER — MIDAZOLAM HCL 10 MG/2ML IJ SOLN
INTRAMUSCULAR | Status: AC
  Filled 2011-12-20: qty 2

## 2011-12-20 NOTE — Op Note (Signed)
[facility] [address] [place], [place]  [address]  OPERATIVE PROCEDURE REPORT  PATIENT:  Brenda, [name]  MR#:  [id number] BIRTHDATE:  04/02/1951  GENDER:  female ENDOSCOPIST:  Jeani Hawking, MD PROCEDURE DATE:  12/20/2011 PROCEDURE:  EGD with dilatation over guidewire, EGD with biopsy, [address] ASA CLASS:  Class III INDICATIONS:  Dysphagia and abnormal esophagram MEDICATIONS:  Fentanyl 37.5 mcg IV, Versed 3 mg IV  DESCRIPTION OF PROCEDURE:   After the risks benefits and alternatives of the procedure were thoroughly explained, informed consent was obtained.  The  endoscope was introduced through the mouth and advanced to the second portion of the duodenum, without limitations.  The instrument was slowly withdrawn as the mucosa was fully examined. <<PROCEDUREIMAGES>>  FINDINGS:  In the mid to distal esophagus there exists the possibility of a mild Candidal esophagitis. No evidence of any stricture was identified in the distal esophagus. The Z-line appeared to be sharp.  Despite the lack of a clear sticture dilation with the 15 mm Savary dilator was performed. Unfortuantely, the 16 mm dilator was not available.  In the gastric body a 5 mm ulcer with an eschar was noted and cold biopsies of the gastritis were obtained. A small superfical duodenal bulb ulcer was also identified.    Retroflexed views revealed no abnormalities.    The scope was then withdrawn from the patient and the procedure terminated.  COMPLICATIONS:  None  IMPRESSION:  1) 5 mm nonbleeding gastric ulcer. 2) Gastritis. 3) Shallow non bleeding duodenal ulcer. 4) ? Candidal esophagitis. RECOMMENDATIONS:  1) Await biopsy results. 2) Emperic treatment with fluconazole. 3) Follow up in one month.  ______________________________ Jeani Hawking, MD  n. Rosalie DoctorJeani Hawking at 12/20/2011 10:30 AM  [place], [phone]

## 2011-12-20 NOTE — H&P (Signed)
  Reason for Consult:Dysphagia and abnormal esophagram Referring Physician: Jeani Hawking, M.D.  [name] HPI: This is a 61 year old female with findings of a stricture on an esophagram.  She complained of dysphagia and the esophagram revealed taht the 13 mm tablet lodged in the distal esophagus.  She is here in the endo unit for dilation.  Past Medical History  Diagnosis Date  . Diabetes mellitus   . Cancer of base of tongue   . History of DVT (deep vein thrombosis)   . History of pneumonia   . History of radiation therapy 03/06/2009    right base of tongue primary and right neck node 7000 cGy 35 sessions  . Coronary artery disease     cath with stenting  . Myocardial infarction   . Stroke 2000    left side weakness  . Shortness of breath   . COPD (chronic obstructive pulmonary disease)   . Transient ischemic attack   . Arthritis   . Depression     Past Surgical History  Procedure Date  . Coronary stent placement   . Vascular bypass surgery     lower extremities  . Appendectomy   . Tee without cardioversion 07/26/2011    Procedure: TRANSESOPHAGEAL ECHOCARDIOGRAM (TEE);  Surgeon: Chrystie Nose, MD;  Location: [facility] ENDOSCOPY;  Service: Cardiovascular;  Laterality: N/A;  . Abdominal hysterectomy     Family History  Problem Relation Age of Onset  . Cancer Mother     pancreatic  . Cancer Father     throat  . Cancer Sister     breast    Social History:  reports that she has been smoking Cigarettes.  She has a 47 pack-year smoking history. She has never used smokeless tobacco. She reports that she drinks about 1.8 ounces of alcohol per week. She reports that she does not use illicit drugs.  Allergies:  Allergies  Allergen Reactions  . Tape Hives    Medications:  Scheduled:   . sodium chloride   Intravenous Once   Continuous:   Results for orders placed during the hospital encounter of 12/20/11 (from the past 24 hour(s))  GLUCOSE, CAPILLARY     Status:  Abnormal   Collection Time   12/20/11  9:02 AM      Component Value Range   Glucose-Capillary 134 (*) 70 - 99 mg/dL     No results found.  ROS:  As stated above in the HPI otherwise negative.  Pulse 75, temperature 97.7 F (36.5 C), temperature source Oral, resp. rate 18, height 5\' 6"  (1.676 m), weight 59.875 kg (132 lb), SpO2 96.00%.    PE: Gen: NAD, Alert and Oriented HEENT:  [place]/AT, EOMI Neck: Supple, no LAD Lungs: CTA Bilaterally CV: RRR without M/G/R ABM: Soft, NTND, +BS Ext: No C/C/E  Assessment/Plan: 1) Esophageal stricture.  Plan: 1) EGD with dilation.  Kycen Spalla D 12/20/2011, 9:39 AM

## 2011-12-23 ENCOUNTER — Encounter (HOSPITAL_COMMUNITY): Payer: Self-pay | Admitting: Gastroenterology

## 2012-01-03 ENCOUNTER — Encounter: Payer: Self-pay | Admitting: Radiation Oncology

## 2012-01-03 ENCOUNTER — Encounter: Payer: Self-pay | Admitting: *Deleted

## 2012-01-03 DIAGNOSIS — Z923 Personal history of irradiation: Secondary | ICD-10-CM | POA: Insufficient documentation

## 2012-01-03 DIAGNOSIS — C01 Malignant neoplasm of base of tongue: Secondary | ICD-10-CM | POA: Insufficient documentation

## 2012-01-07 ENCOUNTER — Ambulatory Visit
Admission: RE | Admit: 2012-01-07 | Discharge: 2012-01-07 | Disposition: A | Discharge status: Home / Self Care | Payer: Medicare Other | Source: Ambulatory Visit | Attending: Radiation Oncology | Admitting: Radiation Oncology

## 2012-01-07 ENCOUNTER — Encounter: Payer: Self-pay | Admitting: Radiation Oncology

## 2012-01-07 VITALS — BP 118/74 | HR 76 | Temp 98.3°F | Resp 20 | Wt 133.8 lb

## 2012-01-07 DIAGNOSIS — C01 Malignant neoplasm of base of tongue: Secondary | ICD-10-CM

## 2012-01-07 NOTE — Progress Notes (Signed)
Pt denies pain, loss of appetite, does have fatigue. She had esophageal dilatation but states she still has difficulty swallowing, takes small bites, cannot eat meats. Pt on Tessalon prn for cough. Took some smoking cessation classes, did not complete course. She is down to 1/2 ppd.

## 2012-01-07 NOTE — Progress Notes (Signed)
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 She still doing well. She tells him that she had distal esophageal dilatation with Dr. Elnoria Howard this past July. Her dysphagia is slightly improved. She continues to smoke up to one pack of cigarettes a day. She tells me she saw Dr. Jenne Pane in June and he'll see her again in October. She also maintains followup with Dr. Gaylyn Rong who she'll see again on September 23.  The physical examination: Alert and oriented. Wt Readings from Last 3 Encounters:  01/07/12 133 lb 12.8 oz (60.691 kg)  12/20/11 132 lb (59.875 kg)  12/20/11 132 lb (59.875 kg)   Temp Readings from Last 3 Encounters:  01/07/12 98.3 F (36.8 C) Oral  12/20/11 97.7 F (36.5 C) Oral  12/20/11 97.7 F (36.5 C) Oral   BP Readings from Last 3 Encounters:  01/07/12 118/74  12/20/11 113/64  12/20/11 113/64   Pulse Readings from Last 3 Encounters:  01/07/12 76  12/20/11 75  12/20/11 75   Head and neck examination: Nodes: Without palpable lymphadenopathy in the neck. Oral cavity remarkable for upper and lower dentures. No visible or palpable evidence for recurrent disease. Indirect mirror examination confirmatory.  Impression: No evidence for recurrent disease. I again encouraged her to stop smoking.  Plan: She'll see Dr. Jenne Pane is October and I'll see her back for a followup visit in 8 months. She'll also maintain her followup with Dr. Gaylyn Rong in September.

## 2012-01-15 NOTE — Progress Notes (Signed)
Received office notes from Dr. Patrick Hung @ [facility]; forwarded to Dr. Ha. 

## 2012-02-24 ENCOUNTER — Encounter: Payer: Self-pay | Admitting: Oncology

## 2012-02-24 ENCOUNTER — Ambulatory Visit (HOSPITAL_BASED_OUTPATIENT_CLINIC_OR_DEPARTMENT_OTHER): Payer: Medicare Other | Admitting: Oncology

## 2012-02-24 ENCOUNTER — Other Ambulatory Visit (HOSPITAL_BASED_OUTPATIENT_CLINIC_OR_DEPARTMENT_OTHER): Payer: Medicare Other

## 2012-02-24 VITALS — BP 131/80 | HR 76 | Temp 97.9°F | Resp 20 | Ht 66.0 in | Wt 131.9 lb

## 2012-02-24 DIAGNOSIS — C029 Malignant neoplasm of tongue, unspecified: Secondary | ICD-10-CM

## 2012-02-24 DIAGNOSIS — Z8673 Personal history of transient ischemic attack (TIA), and cerebral infarction without residual deficits: Secondary | ICD-10-CM

## 2012-02-24 DIAGNOSIS — E1165 Type 2 diabetes mellitus with hyperglycemia: Secondary | ICD-10-CM

## 2012-02-24 DIAGNOSIS — C01 Malignant neoplasm of base of tongue: Secondary | ICD-10-CM

## 2012-02-24 DIAGNOSIS — F17200 Nicotine dependence, unspecified, uncomplicated: Secondary | ICD-10-CM

## 2012-02-24 DIAGNOSIS — E119 Type 2 diabetes mellitus without complications: Secondary | ICD-10-CM

## 2012-02-24 DIAGNOSIS — Z86718 Personal history of other venous thrombosis and embolism: Secondary | ICD-10-CM

## 2012-02-24 DIAGNOSIS — I10 Essential (primary) hypertension: Secondary | ICD-10-CM

## 2012-02-24 LAB — CBC WITH DIFFERENTIAL/PLATELET
BASO%: 0.7 % (ref 0.0–2.0)
HCT: 43.8 % (ref 34.8–46.6)
LYMPH%: 13.7 % — ABNORMAL LOW (ref 14.0–49.7)
MCHC: 33.9 g/dL (ref 31.5–36.0)
MCV: 92.6 fL (ref 79.5–101.0)
MONO#: 0.3 10*3/uL (ref 0.1–0.9)
MONO%: 7.2 % (ref 0.0–14.0)
NEUT%: 76.4 % (ref 38.4–76.8)
Platelets: 154 10*3/uL (ref 145–400)
WBC: 4.4 10*3/uL (ref 3.9–10.3)

## 2012-02-24 LAB — COMPREHENSIVE METABOLIC PANEL (CC13)
AST: 15 U/L (ref 5–34)
Albumin: 3.9 g/dL (ref 3.5–5.0)
Alkaline Phosphatase: 132 U/L (ref 40–150)
BUN: 11 mg/dL (ref 7.0–26.0)
Potassium: 4.6 mEq/L (ref 3.5–5.1)
Sodium: 138 mEq/L (ref 136–145)
Total Protein: 6.7 g/dL (ref 6.4–8.3)

## 2012-02-24 NOTE — Progress Notes (Signed)
[facility] OFFICE PROGRESS NOTE  Cc:  Michiel Sites, MD  DIAGNOSIS:  history of cT1N1M0 squamous cell carcinoma of the right base of the tongue.  PAST THERAPY:  Concurrent definitive weekly cetuximab with daily radiation therapy finished on 03/07/2009.  CURRENT THERAPY:  watchful observation.  INTERVAL HISTORY: Brenda [name] 61 y.o. female returns to clinic by herself for evaluation. She continues to smoke. Reports that she has cut down and smokes a little less than 1 ppd. Attended the smoking cessation class, but did not complete it. Since we last saw her, she had an esophageal dilatation performed by Dr Elnoria Howard. Swallowing improved for a short time, but she reports that some food and pills get stuck in her esophagus when she swallows. Denies chest pain, shortness of breath, dyspnea. No abdominal pain, nausea, vomiting. She has not noticed any new masses or enlarged ly,ph nodes in her neck.   She has xerostomia; however, she denies odynophagia, dysphagia, power lymph node swelling. She does not have a submental fibrotic changes or edema.  Patient denies fatigue, headache, visual changes, confusion, drenching night sweats, palpable lymph node swelling, mucositis, odynophagia, dysphagia, nausea vomiting, jaundice, productive cough, gum bleeding, epistaxis, hematemesis, hemoptysis, abdominal pain, abdominal swelling, early satiety, melena, hematochezia, hematuria, skin rash, spontaneous bleeding, joint swelling, joint pain, heat or cold intolerance, bowel bladder incontinence, back pain, focal motor weakness, paresthesia, depression, suicidal or homocidal ideation, feeling hopelessness.   Past Medical History  Diagnosis Date  . Diabetes mellitus   . Cancer of base of tongue   . History of DVT (deep vein thrombosis)   . History of pneumonia   . History of radiation therapy 03/06/2009    right base of tongue primary and right neck node 7000 cGy 35 sessions  . Coronary artery  disease     cath with stenting  . Myocardial infarction   . Stroke 2000    left side weakness  . Shortness of breath   . COPD (chronic obstructive pulmonary disease)   . Transient ischemic attack   . Arthritis   . Depression   . Hx of radiation therapy 01/16/09 - 03/06/09    base of tongue, right neck node  . Cancer 11/04/2008    right base of tongue    Past Surgical History  Procedure Date  . Coronary stent placement   . Vascular bypass surgery     lower extremities  . Appendectomy   . Tee without cardioversion 07/26/2011    Procedure: TRANSESOPHAGEAL ECHOCARDIOGRAM (TEE);  Surgeon: Chrystie Nose, MD;  Location: [facility] ENDOSCOPY;  Service: Cardiovascular;  Laterality: N/A;  . Abdominal hysterectomy   . Esophagogastroduodenoscopy 12/20/2011    Procedure: ESOPHAGOGASTRODUODENOSCOPY (EGD);  Surgeon: Theda Belfast, MD;  Location: [facility] ENDOSCOPY;  Service: Endoscopy;  Laterality: N/A;  EGD with balloon dilation  . Savory dilation 12/20/2011    Procedure: SAVORY DILATION;  Surgeon: Theda Belfast, MD;  Location: [facility] ENDOSCOPY;  Service: Endoscopy;  Laterality: N/A;    Current Outpatient Prescriptions  Medication Sig Dispense Refill  . acetaminophen (TYLENOL) 325 MG tablet Take 650 mg by mouth every 6 (six) hours as needed. For pain      . aspirin 81 MG tablet Take 81 mg by mouth daily.        . benzonatate (TESSALON) 100 MG capsule Take 100 mg by mouth 3 (three) times daily as needed.      Marland Kitchen buPROPion (WELLBUTRIN SR) 150 MG 12 hr tablet Take 150 mg by  mouth daily.      . calcium carbonate (OS-CAL) 600 MG TABS Take 600 mg by mouth 2 (two) times daily with a meal.        . cholecalciferol (VITAMIN D) 1000 UNITS tablet Take 1,000 Units by mouth daily.        . clopidogrel (PLAVIX) 75 MG tablet Take 75 mg by mouth daily.        . fish oil-omega-3 fatty acids 1000 MG capsule Take 2 g by mouth daily.       . insulin glargine (LANTUS) 100 UNIT/ML injection Inject 4 Units into the skin 2 (two)  times daily.       . insulin glulisine (APIDRA) 100 UNIT/ML injection Inject 4 Units into the skin 3 (three) times daily as needed. For blood sugar levels      . omeprazole (PRILOSEC) 20 MG capsule Take 20 mg by mouth daily.        . rosuvastatin (CRESTOR) 10 MG tablet Take 10 mg by mouth daily.          ALLERGIES:  is allergic to chantix; nicotine; and tape.  REVIEW OF SYSTEMS:  The rest of the 14-point review of system was negative.   Filed Vitals:   02/24/12 1320  BP: 131/80  Pulse: 76  Temp: 97.9 F (36.6 C)  Resp: 20   Wt Readings from Last 3 Encounters:  02/24/12 131 lb 14.4 oz (59.829 kg)  01/07/12 133 lb 12.8 oz (60.691 kg)  12/20/11 132 lb (59.875 kg)   ECOG Performance status: 1  PHYSICAL EXAMINATION:   General:  Thin-appearing woman in no acute distress.  Eyes:  no scleral icterus.  ENT:  There were no oropharyngeal lesions.  Neck was without thyromegaly.  Lymphatics:  Negative cervical, supraclavicular or axillary adenopathy.  Respiratory: lungs were clear bilaterally without wheezing or crackles.  Cardiovascular:  Regular rate and rhythm, S1/S2, without murmur, rub or gallop.  There was no pedal edema.  GI:  abdomen was soft, flat, nontender, nondistended, without organomegaly.  Muscoloskeletal:  no spinal tenderness of palpation of vertebral spine.  Skin exam was without echymosis, petichae.  Neuro exam was nonfocal.  Patient was able to get on and off exam table without assistance.  Gait was normal.  Patient was alerted and oriented.  Attention was good.   Language was appropriate.  Mood was normal without depression.  Speech was not pressured.  Thought content was not tangential.      LABORATORY/RADIOLOGY DATA:  Lab Results  Component Value Date   WBC 4.4 02/24/2012   HGB 14.9 02/24/2012   HCT 43.8 02/24/2012   PLT 154 02/24/2012   GLUCOSE 202* 02/24/2012   CHOL 183 07/24/2011   TRIG 162* 07/24/2011   HDL 53 07/24/2011   LDLCALC 98 07/24/2011   ALT 16 02/24/2012    AST 15 02/24/2012   NA 138 02/24/2012   K 4.6 02/24/2012   CL 102 02/24/2012   CREATININE 1.0 02/24/2012   BUN 11.0 02/24/2012   CO2 27 02/24/2012   TSH 2.876 08/20/2011   INR 0.96 07/26/2011   HGBA1C 9.5* 07/23/2011    ASSESSMENT AND PLAN:    1. History of oropharyngeal squamous cell carcinoma:  Clinically, there is no evidence of recurrent disease or metastases. I discussed with her that she is at very high the risk of recurrent cancer because of her smoking. I encouraged her to follow up with ENT and radiation oncology between visits with me.  As she is  2 years out from the finish the chemoradiation for head and neck cancer, there is no indication for routine surveillance CT unless she is any clinical finding which there is none today. 2. Smoking cessation: Trying to cut down. I have offered to refer her back to the smoking cessation class because she did not complete the prior class. She has declined this today. 3. Diabetes mellitus, type II:  She is on insulin per PCP. 4. Hypercholesterolemia:  On rosuvastatin per PCP. 5. History of COPD:  On inhalers per PCP.  I again stressed smoking cessation 6. History of coronary artery disease:  She is on Plavix, aspirin, rosuvastatin.  For reason unbeknown to be, she is not on ACE inhibitor or beta-blocker.  I will defer this to PCP.  I again stressed smoking cessation. 7. History of CVA:  On aspirin and Plavix.   I again stressed smoking cessation. 8. Hypertension:  Well controlled on diet control only. 9. Follow up:  Return in about 6 months.    The length of time of the face-to-face encounter was 25 minutes. More than 50% of time was spent counseling and coordination of care.

## 2012-06-30 NOTE — Progress Notes (Signed)
Received office notes from Dr. Christia Reading @ [facility]; forwarded to Dr. Gaylyn Rong.

## 2012-07-03 ENCOUNTER — Other Ambulatory Visit: Payer: Self-pay | Admitting: Otolaryngology

## 2012-07-03 DIAGNOSIS — R220 Localized swelling, mass and lump, head: Secondary | ICD-10-CM

## 2012-07-03 DIAGNOSIS — R200 Anesthesia of skin: Secondary | ICD-10-CM

## 2012-07-06 ENCOUNTER — Other Ambulatory Visit: Payer: Self-pay | Admitting: Otolaryngology

## 2012-07-06 DIAGNOSIS — R200 Anesthesia of skin: Secondary | ICD-10-CM

## 2012-07-06 DIAGNOSIS — R221 Localized swelling, mass and lump, neck: Secondary | ICD-10-CM

## 2012-07-16 ENCOUNTER — Inpatient Hospital Stay: Admission: RE | Admit: 2012-07-16 | Payer: Medicare Other | Source: Ambulatory Visit

## 2012-07-16 ENCOUNTER — Other Ambulatory Visit: Payer: Medicare Other

## 2012-07-22 ENCOUNTER — Ambulatory Visit
Admission: RE | Admit: 2012-07-22 | Discharge: 2012-07-22 | Disposition: A | Discharge status: Home / Self Care | Payer: Medicare Other | Source: Ambulatory Visit | Attending: Otolaryngology | Admitting: Otolaryngology

## 2012-07-22 DIAGNOSIS — R200 Anesthesia of skin: Secondary | ICD-10-CM

## 2012-07-22 DIAGNOSIS — R220 Localized swelling, mass and lump, head: Secondary | ICD-10-CM

## 2012-07-22 DIAGNOSIS — R221 Localized swelling, mass and lump, neck: Secondary | ICD-10-CM

## 2012-07-22 MED ORDER — IOHEXOL 300 MG/ML  SOLN
75.0000 mL | Freq: Once | INTRAMUSCULAR | Status: AC | PRN
  Administered 2012-07-22: 75 mL via INTRAVENOUS

## 2012-07-22 MED ORDER — GADOBENATE DIMEGLUMINE 529 MG/ML IV SOLN
12.0000 mL | Freq: Once | INTRAVENOUS | Status: AC | PRN
  Administered 2012-07-22: 12 mL via INTRAVENOUS

## 2012-08-21 ENCOUNTER — Other Ambulatory Visit (HOSPITAL_BASED_OUTPATIENT_CLINIC_OR_DEPARTMENT_OTHER): Payer: Medicare Other | Admitting: Lab

## 2012-08-21 ENCOUNTER — Ambulatory Visit (HOSPITAL_BASED_OUTPATIENT_CLINIC_OR_DEPARTMENT_OTHER): Payer: Medicare Other | Admitting: Oncology

## 2012-08-21 ENCOUNTER — Telehealth: Payer: Self-pay | Admitting: Oncology

## 2012-08-21 VITALS — BP 123/68 | HR 90 | Temp 98.2°F | Resp 20 | Ht 66.0 in | Wt 133.1 lb

## 2012-08-21 DIAGNOSIS — Z85810 Personal history of malignant neoplasm of tongue: Secondary | ICD-10-CM

## 2012-08-21 DIAGNOSIS — F17200 Nicotine dependence, unspecified, uncomplicated: Secondary | ICD-10-CM

## 2012-08-21 DIAGNOSIS — Z8673 Personal history of transient ischemic attack (TIA), and cerebral infarction without residual deficits: Secondary | ICD-10-CM

## 2012-08-21 DIAGNOSIS — C01 Malignant neoplasm of base of tongue: Secondary | ICD-10-CM

## 2012-08-21 DIAGNOSIS — E1165 Type 2 diabetes mellitus with hyperglycemia: Secondary | ICD-10-CM

## 2012-08-21 DIAGNOSIS — C029 Malignant neoplasm of tongue, unspecified: Secondary | ICD-10-CM

## 2012-08-21 DIAGNOSIS — K117 Disturbances of salivary secretion: Secondary | ICD-10-CM

## 2012-08-21 LAB — COMPREHENSIVE METABOLIC PANEL (CC13)
ALT: 22 U/L (ref 0–55)
AST: 18 U/L (ref 5–34)
CO2: 28 mEq/L (ref 22–29)
Sodium: 136 mEq/L (ref 136–145)
Total Bilirubin: 0.49 mg/dL (ref 0.20–1.20)
Total Protein: 6.8 g/dL (ref 6.4–8.3)

## 2012-08-21 LAB — CBC WITH DIFFERENTIAL/PLATELET
Basophils Absolute: 0.1 10*3/uL (ref 0.0–0.1)
EOS%: 1 % (ref 0.0–7.0)
HGB: 15.6 g/dL (ref 11.6–15.9)
LYMPH%: 10.9 % — ABNORMAL LOW (ref 14.0–49.7)
MCH: 32.2 pg (ref 25.1–34.0)
MCV: 92 fL (ref 79.5–101.0)
MONO%: 6 % (ref 0.0–14.0)
RDW: 13.8 % (ref 11.2–14.5)

## 2012-08-21 LAB — TSH: TSH: 2.135 u[IU]/mL (ref 0.350–4.500)

## 2012-08-21 MED ORDER — PILOCARPINE HCL 5 MG PO TABS: 5.0000 mg | ORAL_TABLET | Freq: Three times a day (TID) | ORAL | Status: DC

## 2012-08-21 NOTE — Progress Notes (Signed)
[facility] OFFICE PROGRESS NOTE  Cc:  Michiel Sites, MD  DIAGNOSIS:  history of cT1N1M0 squamous cell carcinoma of the right base of the tongue.  PAST THERAPY:  Concurrent definitive weekly cetuximab with daily radiation therapy finished on 03/07/2009.  CURRENT THERAPY:  watchful observation.  INTERVAL HISTORY: Brenda [name] 62 y.o. female returns to clinic by herself for evaluation.  She reported chronic fatigue.  She still smokes.  She had tried many things in the past; but feel very miserable without cigarettes.  She said that she will never have the will to quit.  She has mild SOB, DOE from chronic smoke.  She denied chest pain, PND, orthopnea, pedal edema, bleeding symptoms, neck swelling.  She does have dry mouth and still has problem with dry foods.  She would like medication for xerostomia.  The rest of the 14-point review of system was negative.    Past Medical History  Diagnosis Date  . Diabetes mellitus   . Cancer of base of tongue   . History of DVT (deep vein thrombosis)   . History of pneumonia   . History of radiation therapy 03/06/2009    right base of tongue primary and right neck node 7000 cGy 35 sessions  . Coronary artery disease     cath with stenting  . Myocardial infarction   . Stroke 2000    left side weakness  . Shortness of breath   . COPD (chronic obstructive pulmonary disease)   . Transient ischemic attack   . Arthritis   . Depression   . Hx of radiation therapy 01/16/09 - 03/06/09    base of tongue, right neck node  . Cancer 11/04/2008    right base of tongue    Past Surgical History  Procedure Laterality Date  . Coronary stent placement    . Vascular bypass surgery      lower extremities  . Appendectomy    . Tee without cardioversion  07/26/2011    Procedure: TRANSESOPHAGEAL ECHOCARDIOGRAM (TEE);  Surgeon: Chrystie Nose, MD;  Location: [facility] ENDOSCOPY;  Service: Cardiovascular;  Laterality: N/A;  . Abdominal hysterectomy     . Esophagogastroduodenoscopy  12/20/2011    Procedure: ESOPHAGOGASTRODUODENOSCOPY (EGD);  Surgeon: Theda Belfast, MD;  Location: [facility] ENDOSCOPY;  Service: Endoscopy;  Laterality: N/A;  EGD with balloon dilation  . Savory dilation  12/20/2011    Procedure: SAVORY DILATION;  Surgeon: Theda Belfast, MD;  Location: [facility] ENDOSCOPY;  Service: Endoscopy;  Laterality: N/A;    Current Outpatient Prescriptions  Medication Sig Dispense Refill  . acetaminophen (TYLENOL) 325 MG tablet Take 650 mg by mouth every 6 (six) hours as needed. For pain      . aspirin 81 MG tablet Take 81 mg by mouth daily.        . benzonatate (TESSALON) 100 MG capsule Take 100 mg by mouth 3 (three) times daily as needed.      Marland Kitchen buPROPion (WELLBUTRIN SR) 150 MG 12 hr tablet Take 150 mg by mouth daily.      . calcium carbonate (OS-CAL) 600 MG TABS Take 600 mg by mouth 2 (two) times daily with a meal.        . cholecalciferol (VITAMIN D) 1000 UNITS tablet Take 1,000 Units by mouth daily.        . clopidogrel (PLAVIX) 75 MG tablet Take 75 mg by mouth daily.        . fish oil-omega-3 fatty acids 1000 MG capsule Take  2 g by mouth daily.       . insulin glargine (LANTUS) 100 UNIT/ML injection Inject 4 Units into the skin 2 (two) times daily.       . insulin glulisine (APIDRA) 100 UNIT/ML injection Inject 4 Units into the skin 3 (three) times daily as needed. For blood sugar levels      . omeprazole (PRILOSEC) 20 MG capsule Take 20 mg by mouth daily.        . rosuvastatin (CRESTOR) 10 MG tablet Take 10 mg by mouth daily.        . pilocarpine (SALAGEN) 5 MG tablet Take 1 tablet (5 mg total) by mouth 3 (three) times daily.  90 tablet  5   No current facility-administered medications for this visit.    ALLERGIES:  is allergic to tape and nicoderm.  REVIEW OF SYSTEMS:  The rest of the 14-point review of system was negative.   Filed Vitals:   08/21/12 1312  BP: 123/68  Pulse: 90  Temp: 98.2 F (36.8 C)  Resp: 20   Wt Readings from  Last 3 Encounters:  08/21/12 133 lb 1.6 oz (60.374 kg)  02/24/12 131 lb 14.4 oz (59.829 kg)  01/07/12 133 lb 12.8 oz (60.691 kg)   ECOG Performance status: 1  PHYSICAL EXAMINATION:   General:  Thin-appearing woman in no acute distress.  Eyes:  no scleral icterus.  ENT:  There were no oropharyngeal lesions.  Neck was without thyromegaly.  Lymphatics:  Negative cervical, supraclavicular or axillary adenopathy.  Respiratory: lungs were clear bilaterally without wheezing or crackles.  Cardiovascular:  Regular rate and rhythm, S1/S2, without murmur, rub or gallop.  There was no pedal edema.  GI:  abdomen was soft, flat, nontender, nondistended, without organomegaly.  Muscoloskeletal:  no spinal tenderness of palpation of vertebral spine.  Skin exam was without echymosis, petichae.  Neuro exam was nonfocal.  Patient was able to get on and off exam table without assistance.  Gait was normal.  Patient was alerted and oriented.  Attention was good.   Language was appropriate.  Mood was normal without depression.  Speech was not pressured.  Thought content was not tangential.      LABORATORY/RADIOLOGY DATA:  Lab Results  Component Value Date   WBC 5.1 08/21/2012   HGB 15.6 08/21/2012   HCT 44.6 08/21/2012   PLT 192 08/21/2012   GLUCOSE 315* 08/21/2012   CHOL 183 07/24/2011   TRIG 162* 07/24/2011   HDL 53 07/24/2011   LDLCALC 98 07/24/2011   ALT 22 08/21/2012   AST 18 08/21/2012   NA 136 08/21/2012   K 4.1 08/21/2012   CL 100 08/21/2012   CREATININE 1.3* 08/21/2012   BUN 14.6 08/21/2012   CO2 28 08/21/2012   TSH 3.381 02/24/2012   INR 0.96 07/26/2011   HGBA1C 9.5* 07/23/2011     ASSESSMENT AND PLAN:    1. History of oropharyngeal squamous cell carcinoma:  In remission.  I advised her to f/u with ENT for yearly laryngoscopic exam. There is no strong indication for routine surveillance CT scan unless she has concerning symptoms.  2. Smoking cessation:  Not ready.   3. Diabetes mellitus, type II:  She is  on insulin per PCP. 4. Hypercholesterolemia:  On rosuvastatin per PCP. 5. History of COPD:  On inhalers per PCP.  6. History of coronary artery disease:  She is on Plavix, aspirin, rosuvastatin.  7. History of CVA:  On aspirin and Plavix.   I again  stressed smoking cessation. 8. Hypertension:  Diet control.  9. Xerostomia:  I prescribed Salagen.   10. Follow up:  Return in about 1 year.    The length of time of the face-to-face encounter was 15 minutes. More than 50% of time was spent counseling and coordination of care.

## 2012-08-21 NOTE — Telephone Encounter (Signed)
gv and printed appt schedule for pt for March 2015 °

## 2012-08-31 ENCOUNTER — Telehealth: Payer: Self-pay | Admitting: Dietician

## 2012-09-08 ENCOUNTER — Ambulatory Visit
Admission: RE | Admit: 2012-09-08 | Discharge: 2012-09-08 | Disposition: A | Discharge status: Home / Self Care | Payer: Medicare Other | Source: Ambulatory Visit | Attending: Radiation Oncology | Admitting: Radiation Oncology

## 2012-09-08 VITALS — BP 155/64 | HR 83 | Temp 98.0°F | Ht 66.0 in | Wt 132.7 lb

## 2012-09-08 DIAGNOSIS — C01 Malignant neoplasm of base of tongue: Secondary | ICD-10-CM

## 2012-09-08 NOTE — Progress Notes (Signed)
CC: Dr. Christia Reading  Followup note: 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 She does have mild dysphagia which is improved after starting Salagen through Dr. Gaylyn Rong. She was seen by Dr. Jenne Pane this past January with a recent history of intermittent numbness along her right jaw complication but also occurring on the left side as well. She describes this as being intermittent and less than 1-4 minutes a day. There there are some days that she is asymptomatic. Dr. Jenne Pane obtained a MRI scan on February 19 which was essentially benign. He did not see any trigeminal nerve abnormality. There was mild asymmetry at the left hyoid bone consistent with calcification of the left stylohyoid ligament. He will see her again in July. 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 She saw Dr. Gaylyn Rong last month and he has her scheduled for a one-year followup. She continues to smoke 1 pack per day.  Physical examination: Alert and oriented. Filed Vitals:   09/08/12 1003  BP: 155/64  Pulse: 83  Temp: 98 F (36.7 C)   Head and neck examination: Nodes: Without palpable lymphadenopathy in the neck. Oral cavity is moist. Oropharynx unremarkable to inspection. Indirect mirror examination without evidence for recurrent disease. Neurologic examination: Sensation is normal along the face and neck.  Impression: No evidence for recurrent disease. I again encouraged her to stop smoking. I wonder if her neurologic symptoms are related to her underlying diabetes.  Plan: She'll see Dr. Jenne Pane for a followup visit in July. Followup visit with me in 8 months.

## 2012-09-08 NOTE — Progress Notes (Signed)
Brenda [name] here for follow up after treatment to the right base of her tongue and a right neck node.  She denies pain but does have tingling that goes along her right jaw bone to her left.  She has recently had an MRI for this that showed calcification of a ligament on the left.  She still has some trouble swallowing especially bread.  She did have her esophagus dialited last year.  She says her xerostomia is improving since Dr. Gaylyn Rong has her taking Salagen three times a day.  She has been able to taste salty foods but not sweet.  She does have shortness of breath with activity.  Her oxygen saturation is 96% on room air.

## 2012-09-21 ENCOUNTER — Other Ambulatory Visit (HOSPITAL_COMMUNITY): Payer: Self-pay | Admitting: Cardiology

## 2012-09-21 DIAGNOSIS — I701 Atherosclerosis of renal artery: Secondary | ICD-10-CM

## 2012-09-21 DIAGNOSIS — I739 Peripheral vascular disease, unspecified: Secondary | ICD-10-CM

## 2012-09-28 ENCOUNTER — Ambulatory Visit (HOSPITAL_COMMUNITY)
Admission: RE | Admit: 2012-09-28 | Discharge: 2012-09-28 | Disposition: A | Discharge status: Home / Self Care | Payer: Medicare Other | Source: Ambulatory Visit | Attending: Cardiology | Admitting: Cardiology

## 2012-09-28 DIAGNOSIS — I739 Peripheral vascular disease, unspecified: Secondary | ICD-10-CM

## 2012-09-28 DIAGNOSIS — I701 Atherosclerosis of renal artery: Secondary | ICD-10-CM | POA: Insufficient documentation

## 2012-09-28 DIAGNOSIS — I7789 Other specified disorders of arteries and arterioles: Secondary | ICD-10-CM | POA: Insufficient documentation

## 2012-09-28 NOTE — Progress Notes (Signed)
Renal artery duplex complete. [facility] 

## 2012-09-28 NOTE — Progress Notes (Signed)
Lower arterial duplex complete [facility] 

## 2012-11-05 ENCOUNTER — Encounter: Payer: Self-pay | Admitting: Cardiovascular Disease

## 2012-11-05 ENCOUNTER — Ambulatory Visit (INDEPENDENT_AMBULATORY_CARE_PROVIDER_SITE_OTHER): Payer: Medicare Other | Admitting: Cardiovascular Disease

## 2012-11-05 VITALS — BP 98/58 | HR 68 | Ht 66.0 in | Wt 132.2 lb

## 2012-11-05 DIAGNOSIS — Z01818 Encounter for other preprocedural examination: Secondary | ICD-10-CM

## 2012-11-05 DIAGNOSIS — I739 Peripheral vascular disease, unspecified: Secondary | ICD-10-CM | POA: Insufficient documentation

## 2012-11-05 DIAGNOSIS — R5383 Other fatigue: Secondary | ICD-10-CM

## 2012-11-05 DIAGNOSIS — D689 Coagulation defect, unspecified: Secondary | ICD-10-CM

## 2012-11-05 DIAGNOSIS — Z79899 Other long term (current) drug therapy: Secondary | ICD-10-CM

## 2012-11-05 NOTE — Progress Notes (Signed)
11/05/2012 Brenda [name]   1951/01/06  [id number]  Primary Physician Michiel Sites, MD Primary Cardiologist: Runell Gess MD Roseanne Reno   HPI:  Brenda [name] is a 62 year old married Caucasian female mother of 4, her mother for grandchildren patient of Dr. Onalee Hua Hardings Adela Lank who saw Dr. [name] approximately 2 months ago. She has a history of coronary artery disease status post PCI multiple times in the past beginning in 1999 with stents in her LAD circumflex and RCA. Her left A former 2007 revealed patent stents with normal LV function. She does have peripheral vascular occlusive disease status post bilateral femoropopliteal bypass grafting in 1999 and 2000 by Dr. Tawanna Cooler early. She's had a stroke in the past as well and continues to smoke. Does include hypertension and diabetes. She saw Dr. [name] complaining of right lower HMA claudication. Shimmy Dopplers revealed a right ABI 0.59 on the left of 1.1. Both femoropopliteal bypass grafts appear to open liver there appeared to be high-grade disease versus short segment occlusion in the above-the-knee popliteal on the right Biot beyond graft insertion.   Current Outpatient Prescriptions  Medication Sig Dispense Refill  . acetaminophen (TYLENOL) 325 MG tablet Take 650 mg by mouth every 6 (six) hours as needed. For pain      . aspirin 81 MG tablet Take 81 mg by mouth daily.        . benzonatate (TESSALON) 100 MG capsule Take 100 mg by mouth 3 (three) times daily as needed.      Marland Kitchen buPROPion (WELLBUTRIN SR) 150 MG 12 hr tablet Take 150 mg by mouth daily.      . calcium carbonate (OS-CAL) 600 MG TABS Take 600 mg by mouth 2 (two) times daily with a meal.        . cholecalciferol (VITAMIN D) 1000 UNITS tablet Take 1,000 Units by mouth daily.        . clopidogrel (PLAVIX) 75 MG tablet Take 75 mg by mouth daily.        Marland Kitchen CRANBERRY PO Take 1 capsule by mouth daily.      . Fiber CAPS Take 2 capsules by mouth 2 (two) times daily.        . fish oil-omega-3 fatty acids 1000 MG capsule Take 2 g by mouth daily.       . insulin glargine (LANTUS) 100 UNIT/ML injection 16-20 units twice a day      . insulin glulisine (APIDRA) 100 UNIT/ML injection Inject 10 Units into the skin 4 (four) times daily as needed. For blood sugar levels      . Multiple Vitamin (MULTIVITAMIN) tablet Take 1 tablet by mouth daily.      . nitroGLYCERIN (NITROLINGUAL) 0.4 MG/SPRAY spray Place 1 spray under the tongue every 5 (five) minutes as needed for chest pain.      Marland Kitchen omeprazole (PRILOSEC) 20 MG capsule Take 20 mg by mouth daily.        . ondansetron (ZOFRAN) 4 MG tablet Take 4 mg by mouth every 8 (eight) hours as needed for nausea.      . pilocarpine (SALAGEN) 5 MG tablet Take 1 tablet (5 mg total) by mouth 3 (three) times daily.  90 tablet  5  . rosuvastatin (CRESTOR) 10 MG tablet Take 10 mg by mouth daily.        Marland Kitchen tiotropium (SPIRIVA) 18 MCG inhalation capsule Place 18 mcg into inhaler and inhale daily.       No current facility-administered medications for this visit.  Allergies  Allergen Reactions  . Tape Hives  . Nicoderm (Nicotine) Rash    To the PATCH only, breakouts on skin    History   Social History  . Marital Status: Married    Spouse Name: N/A    Number of Children: N/A  . Years of Education: N/A   Occupational History  . Not on file.   Social History Main Topics  . Smoking status: Current Every Day Smoker -- 1.00 packs/day for 47 years    Types: Cigarettes  . Smokeless tobacco: Never Used     Comment: 01/07/12 1/2 ppd per pt  . Alcohol Use: 1.8 oz/week    3 Glasses of wine per week     Comment: socially  . Drug Use: No  . Sexually Active:    Other Topics Concern  . Not on file   Social History Narrative  . No narrative on file     Review of Systems: General: negative for chills, fever, night sweats or weight changes.  Cardiovascular: negative for chest pain, dyspnea on exertion, edema, orthopnea, palpitations,  paroxysmal nocturnal dyspnea or shortness of breath Dermatological: negative for rash Respiratory: negative for cough or wheezing Urologic: negative for hematuria Abdominal: negative for nausea, vomiting, diarrhea, bright red blood per rectum, melena, or hematemesis Neurologic: negative for visual changes, syncope, or dizziness All other systems reviewed and are otherwise negative except as noted above.    Blood pressure 98/58, pulse 68, height 5\' 6"  (1.676 m), weight 132 lb 3.2 oz (59.966 kg).  General appearance: alert and no distress Neck: no adenopathy, no JVD, supple, symmetrical, trachea midline, thyroid not enlarged, symmetric, no tenderness/mass/nodules and right carotid bruit Lungs: clear to auscultation bilaterally Heart: regular rate and rhythm, S1, S2 normal, no murmur, click, rub or gallop Extremities: extremities normal, atraumatic, no cyanosis or edema Pulses: 2+ and symmetric 2+ left pedal pulse, absent right pedal pulses  EKG not performed today  ASSESSMENT AND PLAN:   Peripheral arterial disease History of bilateral femoropopliteal bypass graft for by Dr. Tawanna Cooler Early in the past. She has right calf lifestyle limiting claudication. Recent lower extremity Dopplers performed in our office 09/28/12 revealed a right ABI of 0.59 and a left of 1.1. Both bypass grafts were patent but she did have an occluded above the knee popliteal artery on the right. She'll be admitted for angiography and potential percutaneous intervention.      Runell Gess MD FACP,FACC,FAHA, [facility] 11/05/2012 11:58 AM

## 2012-11-05 NOTE — Assessment & Plan Note (Signed)
History of bilateral femoropopliteal bypass graft for by Dr. Tawanna Cooler Early in the past. She has right calf lifestyle limiting claudication. Recent lower extremity Dopplers performed in our office 09/28/12 revealed a right ABI of 0.59 and a left of 1.1. Both bypass grafts were patent but she did have an occluded above the knee popliteal artery on the right. She'll be admitted for angiography and potential percutaneous intervention.

## 2012-11-05 NOTE — Patient Instructions (Addendum)
Dr. Allyson Sabal has ordered a peripheral angiogram to be done at [facility].  This procedure is going to look at the bloodflow in your lower extremities.  If Dr. Allyson Sabal is able to open up the arteries, you will have to spend one night in the hospital.  If he is not able to open the arteries, you will be able to go home that same day.    After the procedure, you will not be allowed to drive for 3 days or push, pull, or lift anything greater than 10 lbs for one week.    You will be required to have bloodwork prior to your procedure.  Our scheduler will advise you on when this item needs to be done.     REPS: Delice Bison and Eric Left groin stick

## 2012-11-06 ENCOUNTER — Encounter: Payer: Self-pay | Admitting: Cardiovascular Disease

## 2012-11-10 ENCOUNTER — Encounter: Payer: Self-pay | Admitting: Cardiology

## 2012-11-18 ENCOUNTER — Encounter (HOSPITAL_COMMUNITY): Payer: Self-pay | Admitting: Pharmacy Technician

## 2012-11-20 LAB — CBC
Hemoglobin: 15.4 g/dL — ABNORMAL HIGH (ref 12.0–15.0)
MCH: 31 pg (ref 26.0–34.0)
MCHC: 34.7 g/dL (ref 30.0–36.0)
Platelets: 203 10*3/uL (ref 150–400)
RBC: 4.96 MIL/uL (ref 3.87–5.11)

## 2012-11-20 LAB — PROTIME-INR: Prothrombin Time: 12.7 seconds (ref 11.6–15.2)

## 2012-11-20 LAB — APTT: aPTT: 35 seconds (ref 24–37)

## 2012-11-21 LAB — BASIC METABOLIC PANEL
BUN: 10 mg/dL (ref 6–23)
CO2: 31 mEq/L (ref 19–32)
Calcium: 9.3 mg/dL (ref 8.4–10.5)
Creat: 0.74 mg/dL (ref 0.50–1.10)

## 2012-11-26 ENCOUNTER — Observation Stay (HOSPITAL_COMMUNITY)
Admission: RE | Admit: 2012-11-26 | Discharge: 2012-11-27 | Disposition: A | Discharge status: Home / Self Care | Payer: Medicare Other | Source: Ambulatory Visit | Attending: Cardiovascular Disease | Admitting: Cardiovascular Disease

## 2012-11-26 ENCOUNTER — Encounter (HOSPITAL_COMMUNITY)
Admission: RE | Disposition: A | Discharge status: Home / Self Care | Payer: Self-pay | Source: Ambulatory Visit | Attending: Cardiovascular Disease

## 2012-11-26 ENCOUNTER — Encounter (HOSPITAL_COMMUNITY): Payer: Self-pay | Admitting: General Practice

## 2012-11-26 DIAGNOSIS — I739 Peripheral vascular disease, unspecified: Secondary | ICD-10-CM | POA: Diagnosis present

## 2012-11-26 DIAGNOSIS — Z01818 Encounter for other preprocedural examination: Secondary | ICD-10-CM

## 2012-11-26 DIAGNOSIS — I70219 Atherosclerosis of native arteries of extremities with intermittent claudication, unspecified extremity: Principal | ICD-10-CM | POA: Insufficient documentation

## 2012-11-26 DIAGNOSIS — Z79899 Other long term (current) drug therapy: Secondary | ICD-10-CM | POA: Insufficient documentation

## 2012-11-26 DIAGNOSIS — E785 Hyperlipidemia, unspecified: Secondary | ICD-10-CM | POA: Diagnosis present

## 2012-11-26 DIAGNOSIS — Z7902 Long term (current) use of antithrombotics/antiplatelets: Secondary | ICD-10-CM | POA: Insufficient documentation

## 2012-11-26 DIAGNOSIS — Z720 Tobacco use: Secondary | ICD-10-CM | POA: Diagnosis present

## 2012-11-26 DIAGNOSIS — IMO0001 Reserved for inherently not codable concepts without codable children: Secondary | ICD-10-CM | POA: Insufficient documentation

## 2012-11-26 DIAGNOSIS — E1165 Type 2 diabetes mellitus with hyperglycemia: Secondary | ICD-10-CM | POA: Diagnosis present

## 2012-11-26 DIAGNOSIS — I10 Essential (primary) hypertension: Secondary | ICD-10-CM | POA: Insufficient documentation

## 2012-11-26 DIAGNOSIS — Z8673 Personal history of transient ischemic attack (TIA), and cerebral infarction without residual deficits: Secondary | ICD-10-CM | POA: Insufficient documentation

## 2012-11-26 DIAGNOSIS — F17200 Nicotine dependence, unspecified, uncomplicated: Secondary | ICD-10-CM | POA: Insufficient documentation

## 2012-11-26 HISTORY — DX: Gastro-esophageal reflux disease without esophagitis: K21.9

## 2012-11-26 HISTORY — DX: Unspecified chronic bronchitis: J42

## 2012-11-26 HISTORY — DX: Pure hypercholesterolemia, unspecified: E78.00

## 2012-11-26 HISTORY — DX: Personal history of other medical treatment: Z92.89

## 2012-11-26 HISTORY — DX: Pneumonia, unspecified organism: J18.9

## 2012-11-26 HISTORY — DX: Type 2 diabetes mellitus without complications: E11.9

## 2012-11-26 HISTORY — DX: Personal history of other diseases of the digestive system: Z87.19

## 2012-11-26 LAB — GLUCOSE, CAPILLARY
Glucose-Capillary: 120 mg/dL — ABNORMAL HIGH (ref 70–99)
Glucose-Capillary: 181 mg/dL — ABNORMAL HIGH (ref 70–99)

## 2012-11-26 SURGERY — ABDOMINAL ANGIOGRAM
Laterality: Bilateral

## 2012-11-26 MED ORDER — LIDOCAINE HCL (PF) 1 % IJ SOLN
INTRAMUSCULAR | Status: AC
  Filled 2012-11-26: qty 30

## 2012-11-26 MED ORDER — DIAZEPAM 5 MG PO TABS
ORAL_TABLET | ORAL | Status: AC
  Administered 2012-11-26: 5 mg via ORAL
  Filled 2012-11-26: qty 1

## 2012-11-26 MED ORDER — ASPIRIN 81 MG PO CHEW
CHEWABLE_TABLET | ORAL | Status: AC
  Administered 2012-11-26: 324 mg via ORAL
  Filled 2012-11-26: qty 4

## 2012-11-26 MED ORDER — SODIUM CHLORIDE 0.9 % IV SOLN
INTRAVENOUS | Status: AC
  Administered 2012-11-26: 12:00:00 via INTRAVENOUS

## 2012-11-26 MED ORDER — INSULIN ASPART 100 UNIT/ML ~~LOC~~ SOLN: 0.0000 [IU] | Freq: Three times a day (TID) | SUBCUTANEOUS | Status: DC

## 2012-11-26 MED ORDER — ACETAMINOPHEN 325 MG PO TABS: 650.0000 mg | ORAL_TABLET | ORAL | Status: DC | PRN

## 2012-11-26 MED ORDER — FENTANYL CITRATE 0.05 MG/ML IJ SOLN
INTRAMUSCULAR | Status: AC
  Filled 2012-11-26: qty 2

## 2012-11-26 MED ORDER — SODIUM CHLORIDE 0.9 % IV SOLN
INTRAVENOUS | Status: DC
Start: 2012-11-27 — End: 2012-11-26
  Administered 2012-11-26: 07:00:00 via INTRAVENOUS

## 2012-11-26 MED ORDER — DIAZEPAM 5 MG PO TABS: 5.0000 mg | ORAL_TABLET | ORAL | Status: AC

## 2012-11-26 MED ORDER — ONDANSETRON HCL 4 MG/2ML IJ SOLN: 4.0000 mg | Freq: Four times a day (QID) | INTRAMUSCULAR | Status: DC | PRN

## 2012-11-26 MED ORDER — SODIUM CHLORIDE 0.9 % IJ SOLN: 3.0000 mL | INTRAMUSCULAR | Status: DC | PRN

## 2012-11-26 MED ORDER — GUAIFENESIN 100 MG/5ML PO SYRP
200.0000 mg | ORAL_SOLUTION | ORAL | Status: DC | PRN
  Administered 2012-11-26: 200 mg via ORAL
  Filled 2012-11-26 (×3): qty 10

## 2012-11-26 MED ORDER — MIDAZOLAM HCL 2 MG/2ML IJ SOLN
INTRAMUSCULAR | Status: AC
  Filled 2012-11-26: qty 2

## 2012-11-26 MED ORDER — INSULIN ASPART 100 UNIT/ML ~~LOC~~ SOLN
0.0000 [IU] | Freq: Once | SUBCUTANEOUS | Status: AC
  Administered 2012-11-26: 19:00:00 5 [IU] via SUBCUTANEOUS

## 2012-11-26 MED ORDER — ASPIRIN 81 MG PO CHEW: 324.0000 mg | CHEWABLE_TABLET | ORAL | Status: AC

## 2012-11-26 MED ORDER — HEPARIN (PORCINE) IN NACL 2-0.9 UNIT/ML-% IJ SOLN
INTRAMUSCULAR | Status: AC
  Filled 2012-11-26: qty 1000

## 2012-11-26 NOTE — Progress Notes (Signed)
Utilization Review Completed Chrisopher Pustejovsky J. Siya Flurry, RN, BSN, NCM [phone]  

## 2012-11-26 NOTE — Progress Notes (Signed)
Called into pts room because pt reports something dripping down her leg.  Pt had a large amt of bright red blood under her leg and on her gown and on her (L) hand where she had been supporting her groin.  Pressure held to (L) groin site X 20 min during which time cath lab ie Pat notified to assess site.  Pts site had stopped bleeding however, everytime pt coughs it rebleeds.  Pt reports this is what started the bleeding origonally. VSS. Dr. Allyson Sabal notified and will admit pt for 23 hr obs.  Bed control notified of need for 6500 bed

## 2012-11-26 NOTE — H&P (View-Only) (Signed)
11/05/2012 Brenda [name]   June 26, 1950  [id number]  Primary Physician Michiel Sites, MD Primary Cardiologist: Runell Gess MD Roseanne Reno   HPI:  Brenda [name] is a 62 year old married Caucasian female mother of 4, her mother for grandchildren patient of Dr. Onalee Hua Hardings Adela Lank who saw Dr. [name] approximately 2 months ago. She has a history of coronary artery disease status post PCI multiple times in the past beginning in 1999 with stents in her LAD circumflex and RCA. Her left A former 2007 revealed patent stents with normal LV function. She does have peripheral vascular occlusive disease status post bilateral femoropopliteal bypass grafting in 1999 and 2000 by Dr. Tawanna Cooler early. She's had a stroke in the past as well and continues to smoke. Does include hypertension and diabetes. She saw Dr. [name] complaining of right lower HMA claudication. Shimmy Dopplers revealed a right ABI 0.59 on the left of 1.1. Both femoropopliteal bypass grafts appear to open liver there appeared to be high-grade disease versus short segment occlusion in the above-the-knee popliteal on the right Biot beyond graft insertion.   Current Outpatient Prescriptions  Medication Sig Dispense Refill  . acetaminophen (TYLENOL) 325 MG tablet Take 650 mg by mouth every 6 (six) hours as needed. For pain      . aspirin 81 MG tablet Take 81 mg by mouth daily.        . benzonatate (TESSALON) 100 MG capsule Take 100 mg by mouth 3 (three) times daily as needed.      Marland Kitchen buPROPion (WELLBUTRIN SR) 150 MG 12 hr tablet Take 150 mg by mouth daily.      . calcium carbonate (OS-CAL) 600 MG TABS Take 600 mg by mouth 2 (two) times daily with a meal.        . cholecalciferol (VITAMIN D) 1000 UNITS tablet Take 1,000 Units by mouth daily.        . clopidogrel (PLAVIX) 75 MG tablet Take 75 mg by mouth daily.        Marland Kitchen CRANBERRY PO Take 1 capsule by mouth daily.      . Fiber CAPS Take 2 capsules by mouth 2 (two) times daily.        . fish oil-omega-3 fatty acids 1000 MG capsule Take 2 g by mouth daily.       . insulin glargine (LANTUS) 100 UNIT/ML injection 16-20 units twice a day      . insulin glulisine (APIDRA) 100 UNIT/ML injection Inject 10 Units into the skin 4 (four) times daily as needed. For blood sugar levels      . Multiple Vitamin (MULTIVITAMIN) tablet Take 1 tablet by mouth daily.      . nitroGLYCERIN (NITROLINGUAL) 0.4 MG/SPRAY spray Place 1 spray under the tongue every 5 (five) minutes as needed for chest pain.      Marland Kitchen omeprazole (PRILOSEC) 20 MG capsule Take 20 mg by mouth daily.        . ondansetron (ZOFRAN) 4 MG tablet Take 4 mg by mouth every 8 (eight) hours as needed for nausea.      . pilocarpine (SALAGEN) 5 MG tablet Take 1 tablet (5 mg total) by mouth 3 (three) times daily.  90 tablet  5  . rosuvastatin (CRESTOR) 10 MG tablet Take 10 mg by mouth daily.        Marland Kitchen tiotropium (SPIRIVA) 18 MCG inhalation capsule Place 18 mcg into inhaler and inhale daily.       No current facility-administered medications for this visit.  Allergies  Allergen Reactions  . Tape Hives  . Nicoderm (Nicotine) Rash    To the PATCH only, breakouts on skin    History   Social History  . Marital Status: Married    Spouse Name: N/A    Number of Children: N/A  . Years of Education: N/A   Occupational History  . Not on file.   Social History Main Topics  . Smoking status: Current Every Day Smoker -- 1.00 packs/day for 47 years    Types: Cigarettes  . Smokeless tobacco: Never Used     Comment: 01/07/12 1/2 ppd per pt  . Alcohol Use: 1.8 oz/week    3 Glasses of wine per week     Comment: socially  . Drug Use: No  . Sexually Active:    Other Topics Concern  . Not on file   Social History Narrative  . No narrative on file     Review of Systems: General: negative for chills, fever, night sweats or weight changes.  Cardiovascular: negative for chest pain, dyspnea on exertion, edema, orthopnea, palpitations,  paroxysmal nocturnal dyspnea or shortness of breath Dermatological: negative for rash Respiratory: negative for cough or wheezing Urologic: negative for hematuria Abdominal: negative for nausea, vomiting, diarrhea, bright red blood per rectum, melena, or hematemesis Neurologic: negative for visual changes, syncope, or dizziness All other systems reviewed and are otherwise negative except as noted above.    Blood pressure 98/58, pulse 68, height 5\' 6"  (1.676 m), weight 132 lb 3.2 oz (59.966 kg).  General appearance: alert and no distress Neck: no adenopathy, no JVD, supple, symmetrical, trachea midline, thyroid not enlarged, symmetric, no tenderness/mass/nodules and right carotid bruit Lungs: clear to auscultation bilaterally Heart: regular rate and rhythm, S1, S2 normal, no murmur, click, rub or gallop Extremities: extremities normal, atraumatic, no cyanosis or edema Pulses: 2+ and symmetric 2+ left pedal pulse, absent right pedal pulses  EKG not performed today  ASSESSMENT AND PLAN:   Peripheral arterial disease History of bilateral femoropopliteal bypass graft for by Dr. Tawanna Cooler Early in the past. She has right calf lifestyle limiting claudication. Recent lower extremity Dopplers performed in our office 09/28/12 revealed a right ABI of 0.59 and a left of 1.1. Both bypass grafts were patent but she did have an occluded above the knee popliteal artery on the right. She'll be admitted for angiography and potential percutaneous intervention.      Runell Gess MD FACP,FACC,FAHA, [facility] 11/05/2012 11:58 AM

## 2012-11-26 NOTE — Progress Notes (Signed)
Pts groin unremarkable. Dressed with pressure dressing.  Pat from cath lab assessed site.  Waiting for 6500 bed

## 2012-11-26 NOTE — CV Procedure (Signed)
Brenda [name] is a 62 y.o. female    [id number] LOCATION:  FACILITY: [facility]  PHYSICIAN: Nanetta Batty, M.D. 1950/10/31   DATE OF PROCEDURE:  11/26/2012  DATE OF DISCHARGE:   PV ANGIOGRAM    History obtained from chart review.Brenda [name] is a 62 year old married Caucasian female mother of 4,  patient of Dr. Onalee Hua Harding's  & Maurine Minister Kohut's  who saw Dr. [name] approximately 2 months ago. She has a history of coronary artery disease status post PCI multiple times in the past beginning in 1999 with stents in her LAD, circumflex and RCA. Her last cardiac catheterization performed in  2007 revealed patent stents with normal LV function. She does have peripheral vascular occlusive disease status post bilateral femoropopliteal bypass grafting in 1999 and 2000 by Dr. Tawanna Cooler early. She's had a stroke in the past as well and continues to smoke. Her risk factors include include hypertension and diabetes. She saw Dr. [name] complaining of right lower extremity claudication. Lower extremity arterial Dopplers revealed a right ABI 0.59 and  on the left of 1.1. Both femoropopliteal bypass grafts appear to open and there appeared to be high-grade disease versus short segment occlusion in the above-the-knee popliteal on the right beyond graft insertion. She presents today for abdominal aortography with bifemoral runoff using bolus chase digital subtraction step table technique.    PROCEDURE DESCRIPTION:    The patient was brought to the second floor [facility] cath lab in the postabsorptive state. She was premedicated with Valium 5 mg by mouth, IV Versed and fentanyl. Her left groinwas prepped and shaved in usual sterile fashion. Xylocaine 1% was used for local anesthesia. A 5 French sheath was inserted into the left common femoral artery using standard Seldinger technique.a 5 French pigtail catheter was used for abdominal aortography, bilateral iliac artery angiography with bifemoral runoff. Visipaque dye was  used for the entirety of the case (120 cc). Retrograde aortic pressure was monitored during the case.   HEMODYNAMICS:    AO SYSTOLIC/AO DIASTOLIC: 98/50    ANGIOGRAPHIC RESULTS:   1: Abdominal aortogram-renal arteries widely patent. The infrarenal abdominal aorta is free of significant atherosclerotic changes  2: Left lower extremity-the left SFA was occluded. The left femoral to below-knee popliteal bypass graft is widely patent. There was three-vessel runoff  3: Right lower extremity-the native right SFA was occluded. The right for him to above-the-knee popliteal bypass graft is widely patent. The popliteal artery is occluded at the level of the knee and reconstituted just above the takeoff of the anterior tibial artery. There was three-vessel runoff    IMPRESSION:Brenda [name] has patent bilateral femoropopliteal bypass graft with an occluded below the knee popliteal artery on the right. Her options are limited. Especially since this is his claudication and not critical limb ischemia. At this point continued medical therapy will be recommended will review the entrance of my colleagues. She may be a candidate for percutaneous canalization of the chronic total occlusion and/or stenting with a IDEV stent. The sheath was removed pressure was held on the groin to achieve hemostasis. The patient left the Cath Lab in stable condition. She will be gently hydrated and discharged home after remaining her, for 4 hours. I will see her back in the office in one to 2 weeks for followup.  Runell Gess MD, [facility] 11/26/2012 9:31 AM

## 2012-11-26 NOTE — Interval H&P Note (Signed)
History and Physical Interval Note:  H & P will be scanned in.  Pt was reexamined and existing H & P reviewed. No changes found.  Runell Gess, MD [facility] 11/26/2012 9:01 AM   11/26/2012 9:00 AM  Brenda [name]  has presented today for surgery, with the diagnosis of Claudication  The various methods of treatment have been discussed with the patient and family. After consideration of risks, benefits and other options for treatment, the patient has consented to  Procedure(s): ATHERECTOMY (N/A) as a surgical intervention .  The patient's history has been reviewed, patient examined, no change in status, stable for surgery.  I have reviewed the patient's chart and labs.  Questions were answered to the patient's satisfaction.     Runell Gess

## 2012-11-27 DIAGNOSIS — I739 Peripheral vascular disease, unspecified: Secondary | ICD-10-CM | POA: Diagnosis present

## 2012-11-27 DIAGNOSIS — Z01818 Encounter for other preprocedural examination: Secondary | ICD-10-CM

## 2012-11-27 LAB — CBC
HCT: 40.1 % (ref 36.0–46.0)
Hemoglobin: 13.3 g/dL (ref 12.0–15.0)
MCH: 30.6 pg (ref 26.0–34.0)
RBC: 4.35 MIL/uL (ref 3.87–5.11)

## 2012-11-27 MED ORDER — PANTOPRAZOLE SODIUM 40 MG PO TBEC: 40.0000 mg | DELAYED_RELEASE_TABLET | Freq: Every day | ORAL | Status: DC

## 2012-11-27 MED ORDER — BUPROPION HCL ER (SR) 150 MG PO TB12: 150.0000 mg | ORAL_TABLET | Freq: Every day | ORAL | Status: DC

## 2012-11-27 MED ORDER — OMEGA-3-ACID ETHYL ESTERS 1 G PO CAPS
1.0000 g | ORAL_CAPSULE | Freq: Two times a day (BID) | ORAL | Status: DC
  Filled 2012-11-27 (×2): qty 1

## 2012-11-27 MED ORDER — ONE-DAILY MULTI VITAMINS PO TABS: 1.0000 | ORAL_TABLET | Freq: Every day | ORAL | Status: DC

## 2012-11-27 MED ORDER — NITROGLYCERIN 0.4 MG/SPRAY TL SOLN
1.0000 | Status: DC | PRN
  Filled 2012-11-27: qty 4.9

## 2012-11-27 MED ORDER — PILOCARPINE HCL 5 MG PO TABS
5.0000 mg | ORAL_TABLET | Freq: Three times a day (TID) | ORAL | Status: DC
  Filled 2012-11-27 (×3): qty 1

## 2012-11-27 MED ORDER — INSULIN GLARGINE 100 UNIT/ML ~~LOC~~ SOLN
20.0000 [IU] | Freq: Two times a day (BID) | SUBCUTANEOUS | Status: DC
  Filled 2012-11-27 (×2): qty 0.2

## 2012-11-27 MED ORDER — CALCIUM POLYCARBOPHIL 625 MG PO TABS
625.0000 mg | ORAL_TABLET | Freq: Two times a day (BID) | ORAL | Status: DC
Start: 2012-11-27 — End: 2012-11-27
  Filled 2012-11-27 (×2): qty 1

## 2012-11-27 MED ORDER — INSULIN GLARGINE 100 UNIT/ML ~~LOC~~ SOLN
16.0000 [IU] | Freq: Two times a day (BID) | SUBCUTANEOUS | Status: DC
  Filled 2012-11-27 (×2): qty 0.16

## 2012-11-27 MED ORDER — INSULIN GLARGINE 100 UNIT/ML ~~LOC~~ SOLN: 16.0000 [IU] | Freq: Two times a day (BID) | SUBCUTANEOUS | Status: DC

## 2012-11-27 MED ORDER — ASPIRIN EC 81 MG PO TBEC
81.0000 mg | DELAYED_RELEASE_TABLET | Freq: Every day | ORAL | Status: DC
  Filled 2012-11-27: qty 1

## 2012-11-27 MED ORDER — ACETAMINOPHEN 500 MG PO TABS
1000.0000 mg | ORAL_TABLET | Freq: Four times a day (QID) | ORAL | Status: DC | PRN
  Filled 2012-11-27: qty 2

## 2012-11-27 MED ORDER — BUPROPION HCL ER (XL) 150 MG PO TB24
150.0000 mg | ORAL_TABLET | Freq: Every day | ORAL | Status: DC
  Filled 2012-11-27: qty 1

## 2012-11-27 MED ORDER — FIBER PO CAPS: 2.0000 | ORAL_CAPSULE | Freq: Two times a day (BID) | ORAL | Status: DC

## 2012-11-27 MED ORDER — CLOPIDOGREL BISULFATE 75 MG PO TABS
75.0000 mg | ORAL_TABLET | Freq: Every day | ORAL | Status: DC
  Filled 2012-11-27: qty 1

## 2012-11-27 MED ORDER — ADULT MULTIVITAMIN W/MINERALS CH
1.0000 | ORAL_TABLET | Freq: Every day | ORAL | Status: DC
  Filled 2012-11-27: qty 1

## 2012-11-27 MED ORDER — ATORVASTATIN CALCIUM 40 MG PO TABS
40.0000 mg | ORAL_TABLET | Freq: Every day | ORAL | Status: DC
  Filled 2012-11-27: qty 1

## 2012-11-27 MED ORDER — OMEGA-3 FATTY ACIDS 1000 MG PO CAPS: 1.0000 g | ORAL_CAPSULE | Freq: Two times a day (BID) | ORAL | Status: DC

## 2012-11-27 MED ORDER — DOCUSATE SODIUM 100 MG PO CAPS
100.0000 mg | ORAL_CAPSULE | Freq: Every day | ORAL | Status: DC
  Filled 2012-11-27: qty 1

## 2012-11-27 MED ORDER — TIOTROPIUM BROMIDE MONOHYDRATE 18 MCG IN CAPS
18.0000 ug | ORAL_CAPSULE | Freq: Every day | RESPIRATORY_TRACT | Status: DC
  Filled 2012-11-27: qty 5

## 2012-11-27 NOTE — Progress Notes (Addendum)
62 year old married Caucasian female mother of 4, patient of Dr. Onalee Hua Alayjah Boehringer's & Maurine Minister Kohut's who saw Dr. [name] approximately 2 months ago. She has a history of coronary artery disease status post PCI multiple times in the past beginning in 1999 with stents in her LAD, circumflex and RCA. Her last cardiac catheterization performed in 2007 revealed patent stents with normal LV function. She does have peripheral vascular occlusive disease status post bilateral femoropopliteal bypass grafting in 1999 and 2000 by Dr. Gretta Began. She's had a stroke in the past as well and continues to smoke. Her risk factors include include hypertension and diabetes. She saw Dr. [name] complaining of right lower extremity claudication. Lower extremity arterial Dopplers revealed a right ABI 0.59 and on the left of 1.1. Both femoropopliteal bypass grafts appear to open and there appeared to be high-grade disease versus short segment occlusion in the above-the-knee popliteal on the right beyond graft insertion. She presented 11/26/12 for abdominal aortography with bifemoral runoff using bolus chase digital subtraction step table technique. Results:  patent bilateral femoropopliteal bypass graft with an occluded below the knee popliteal artery on the right. Her options are limited. Especially since this is is claudication and not critical limb ischemia. At this point continued medical therapy will be recommended and Dr. Allyson Sabal will review with his colleagues. She may be a candidate for percutaneous canalization of the chronic total occlusion and/or stenting with a IDEV stent.  She was to go home but due to bleeding at the cath site she was kept overnight.  Subjective: Feels ok.  Groins sore, but not too bad.  No more bleeding o/n.    Objective: Vital signs in last 24 hours: Temp:  [97.5 F (36.4 C)-98.3 F (36.8 C)] 97.8 F (36.6 C) (06/27 0839) Pulse Rate:  [62-88] 88 (06/27 0839) Resp:  [18-20] 20 (06/27  0839) BP: (102-154)/(48-79) 134/79 mmHg (06/27 0839) SpO2:  [91 %-99 %] 96 % (06/27 0839) Weight change:  Last BM Date: 11/25/12 Intake/Output from previous day: -640 06/26 0701 - 06/27 0700 In: 360 [P.O.:360] Out: 1000 [Urine:1000] Intake/Output this shift:    PE: General:Pleasant affect, NAD Skin:Warm and dry, brisk capillary refill Heart:S1S2 RRR without murmur, gallup, rub or click Lungs: mild bibasilar crackles (her baseline) otherwise no rales, rhonchi, or wheezes WGN:FAOZ, non tender, + BS, do not palpate liver spleen or masses Ext:no lower ext edema;p diminished pulses R foot Neuro:alert and oriented, MAE, follows commands, + facial symmetry Cath access site: ecchymoses in the inguinal region to the mons-pubis, sore, but not accessively.    Studies/Results: No results found.  Medications: I have reviewed the patient's current medications. Scheduled Meds: . aspirin EC  81 mg Oral Daily  . atorvastatin  40 mg Oral q1800  . buPROPion  150 mg Oral Daily  . clopidogrel  75 mg Oral QAC breakfast  . docusate sodium  100 mg Oral Daily  . insulin aspart  0-15 Units Subcutaneous TID WC  . insulin glargine  16-20 Units Subcutaneous BID  . multivitamin  1 tablet Oral Daily  . omega-3 acid ethyl esters  1 g Oral BID  . pantoprazole  40 mg Oral Daily  . pilocarpine  5 mg Oral TID  . polycarbophil  625 mg Oral BID  . tiotropium  18 mcg Inhalation Daily   Continuous Infusions:  PRN Meds:.acetaminophen, guaifenesin, nitroGLYCERIN, ondansetron (ZOFRAN) IV  Assessment/Plan: Principal Problem:   Peripheral arterial disease, hx. bil. fempop bypass grafting. Rt ABI 0.59, 11/26/12 patent  fempop graft with occluded below the knee popliteal on rt. Active Problems:   Claudication in peripheral vascular disease   DM (diabetes mellitus), type 2, uncontrolled   History of CVA (cerebrovascular accident)   Tobacco abuse   Hyperlipemia  PLAN: check CBC, ambulate and d/c home if no further  bleeding.  Follow up with Dr. Allyson Sabal to discuss if any further procedures.    LOS: 1 day   Time spent with pt. :10 minutes. [facility]  Nurse Practitioner Certified Pager [phone] 11/27/2012, 8:54 AM  I have seen and evaluated the patient this AM along with Nada Boozer, NP. I agree with her findings, examination as well as impression recommendations.  Diagnostic LEA Angiography - infrapopliteal disease.  Monitored o/n due to hypotension & oozing from arteriotomy site.   Stable o/n -- will need to check CBC prior to d/c.   BP & HR stable.  Continue home medications on d/c.    MD Time with pt: 10 min  Adric Wrede W, M.D., M.S. [facility] [address] [place], [place]  [address]  [phone] Pager # [phone] 11/27/2012 10:00 AM

## 2012-11-27 NOTE — Progress Notes (Signed)
Nutrition Brief Note  Patient identified on the Malnutrition Screening Tool (MST) Report for weight loss and poor intake PTA. Per review of usual weights below, weight has been stable. PO intake of meals has been excellent. Plans for D/C home today.  Wt Readings from Last 10 Encounters:  11/26/12 132 lb (59.875 kg)  11/26/12 132 lb (59.875 kg)  11/05/12 132 lb 3.2 oz (59.966 kg)  09/08/12 132 lb 11.2 oz (60.192 kg)  08/21/12 133 lb 1.6 oz (60.374 kg)  02/24/12 131 lb 14.4 oz (59.829 kg)  01/07/12 133 lb 12.8 oz (60.691 kg)  12/20/11 132 lb (59.875 kg)  12/20/11 132 lb (59.875 kg)  08/21/11 133 lb 9.6 oz (60.601 kg)    Body mass index is 21.32 kg/(m^2). Patient meets criteria for normal weight based on current BMI.   Current diet order is CHO-modified medium, patient is consuming approximately 50-100% of meals at this time. Labs and medications reviewed.   No nutrition interventions warranted at this time. If nutrition issues arise, please consult RD.   Joaquin Courts, RD, LDN, CNSC Pager [phone] After Hours Pager [phone]

## 2012-11-27 NOTE — Discharge Summary (Signed)
Physician Discharge Summary      Patient ID: Brenda [name] MRN: [id number] DOB/AGE: 1951-03-25 62 y.o.  Admit date: 11/26/2012 Discharge date: 11/27/2012  Discharge Diagnoses:  Principal Problem:   Peripheral arterial disease, hx. bil. fempop bypass grafting. Rt ABI 0.59, 11/26/12 patent fempop graft with occluded below the knee popliteal on rt. Active Problems:   Claudication in peripheral vascular disease   DM (diabetes mellitus), type 2, uncontrolled   History of CVA (cerebrovascular accident)   Tobacco abuse   Hyperlipemia   Discharged Condition: good  Procedures: 11/26/12 by Dr. Allyson Sabal  abdominal aortography with bifemoral runoff    Hospital Course:  61 year old married Caucasian female mother of 4, patient of Dr. Onalee Hua [facility] & Maurine Minister [facility] who saw Dr. [name] approximately 2 months ago. She has a history of coronary artery disease status post PCI multiple times in the past beginning in 1999 with stents in her LAD, circumflex and RCA. Her last cardiac catheterization performed in 2007 revealed patent stents with normal LV function. She does have peripheral vascular occlusive disease status post bilateral femoropopliteal bypass grafting in 1999 and 2000 by Dr. Gretta Began. She's had a stroke in the past as well and continues to smoke. Her risk factors include include hypertension and diabetes. She saw Dr. [name] complaining of right lower extremity claudication. Lower extremity arterial Dopplers revealed a right ABI 0.59 and on the left of 1.1. Both femoropopliteal bypass grafts appear to open and there appeared to be high-grade disease versus short segment occlusion in the above-the-knee popliteal on the right beyond graft insertion. She presented 11/26/12 for abdominal aortography with bifemoral runoff and possible PCI.   Results revealed patent bilateral femoropopliteal bypass graft with an occluded below the knee popliteal artery on the right. Her options are limited. Especially  since this is is claudication and not critical limb ischemia. At this point continued medical therapy will be recommended and Dr. Allyson Sabal will review with his colleagues. She may be a candidate for percutaneous canalization of the chronic total occlusion and/or stenting with a IDEV stent.     She bled from the cath site and was kept overnight for OBS.  By the next am CBC was stable, though plts slightly low.  She ambulated without complications and was stable for discharge home.   Consults: None  Significant Diagnostic Studies:  CBC    Component Value Date/Time   WBC 4.0 11/27/2012 0945   WBC 5.1 08/21/2012 1256   RBC 4.35 11/27/2012 0945   RBC 4.85 08/21/2012 1256   HGB 13.3 11/27/2012 0945   HGB 15.6 08/21/2012 1256   HCT 40.1 11/27/2012 0945   HCT 44.6 08/21/2012 1256   PLT 139* 11/27/2012 0945   PLT 192 08/21/2012 1256   MCV 92.2 11/27/2012 0945   MCV 92.0 08/21/2012 1256   MCH 30.6 11/27/2012 0945   MCH 32.2 08/21/2012 1256   MCHC 33.2 11/27/2012 0945   MCHC 34.9 08/21/2012 1256   RDW 14.3 11/27/2012 0945   RDW 13.8 08/21/2012 1256   LYMPHSABS 0.6* 08/21/2012 1256   LYMPHSABS 0.8 07/23/2011 1905   MONOABS 0.3 08/21/2012 1256   MONOABS 0.3 07/23/2011 1905   EOSABS 0.0 08/21/2012 1256   EOSABS 0.1 07/23/2011 1905   BASOSABS 0.1 08/21/2012 1256   BASOSABS 0.1 07/23/2011 1905       Discharge Exam: Blood pressure 134/79, pulse 88, temperature 97.8 F (36.6 C), temperature source Oral, resp. rate 20, height 5\' 6"  (1.676 m), weight 132 lb (59.875 kg), SpO2  96.00%. AM Exam:  PE: General:Pleasant affect, NAD  Skin:Warm and dry, brisk capillary refill  Heart:S1S2 RRR without murmur, gallup, rub or click  Lungs: mild bibasilar crackles (her baseline) otherwise no rales, rhonchi, or wheezes  ZOX:WRUE, non tender, + BS, do not palpate liver spleen or masses  Ext:no lower ext edema;p diminished pulses R foot  Neuro:alert and oriented, MAE, follows commands, + facial symmetry  Cath access site:  ecchymoses in the inguinal region to the mons-pubis, sore, but not accessively.   Disposition: 01-Home or Self Care   Future Appointments Provider Department Dept Phone   12/24/2012 10:45 AM Runell Gess, MD [facility] [place] [phone]   05/11/2013 10:00 AM Maryln Gottron, MD [facility] RADIATION ONCOLOGY [phone]   08/20/2013 10:15 AM Krista [facility] MEDICAL ONCOLOGY [phone]   08/20/2013 10:45 AM Myrtis Ser, NP [facility] MEDICAL ONCOLOGY [phone]       Medication List    TAKE these medications       acetaminophen 500 MG tablet  Commonly known as:  TYLENOL  Take 1,000 mg by mouth every 6 (six) hours as needed for pain.     aspirin EC 81 MG tablet  Take 81 mg by mouth daily.     clopidogrel 75 MG tablet  Commonly known as:  PLAVIX  Take 75 mg by mouth daily.     docusate sodium 100 MG capsule  Commonly known as:  COLACE  Take 100 mg by mouth daily.     Fiber Caps  Take 2 capsules by mouth 2 (two) times daily.     fish oil-omega-3 fatty acids 1000 MG capsule  Take 1 g by mouth 2 (two) times daily.     insulin glargine 100 UNIT/ML injection  Commonly known as:  LANTUS  Inject 16-20 Units into the skin 2 (two) times daily. 16-20 units twice a day     insulin glulisine 100 UNIT/ML injection  Commonly known as:  APIDRA  Inject 4-8 Units into the skin 4 (four) times daily as needed for high blood sugar. For blood sugar levels     multivitamin tablet  Take 1 tablet by mouth daily.     nitroGLYCERIN 0.4 MG/SPRAY spray  Commonly known as:  NITROLINGUAL  Place 1 spray under the tongue every 5 (five) minutes as needed for chest pain.     omeprazole 20 MG capsule  Commonly known as:  PRILOSEC  Take 20 mg by mouth daily.     pilocarpine 5 MG tablet  Commonly known as:  SALAGEN  Take 5 mg by mouth 3 (three) times daily.     rosuvastatin 20 MG tablet  Commonly  known as:  CRESTOR  Take 20 mg by mouth daily.     tiotropium 18 MCG inhalation capsule  Commonly known as:  SPIRIVA  Place 18 mcg into inhaler and inhale daily.      ASK your doctor about these medications       buPROPion 150 MG 24 hr tablet  Commonly known as:  WELLBUTRIN XL  Take 150 mg by mouth daily.           Follow-up Information   Follow up with Runell Gess, MD On 12/24/2012. (at 10:45 am)    Contact information:   [address] [place] [address] [phone]      Discharge Instructions:  Call [facility] if any  bleeding, swelling or drainage at cath site.  May shower, no tub baths for 48 hours for groin sticks.   No driving for 3 days, no lifting over 5 pounds for 3 days.    Heart Healthy diabetic diet.  You will follow up with Dr. Allyson Sabal   Signed: Nada Boozer R Nurse Practitioner-Certified [facility] 11/27/2012, 10:55 AM  Time spent on discharge :30 minutes.    I saw evaluated the patient on the AM of d/c along with Nada Boozer, NP.  I agree with her summary.  Diagnostic LEA Angiography - infrapopliteal disease. Monitored o/n due to hypotension & oozing from arteriotomy site.  Stable o/n -- will need to check CBC prior to d/c.  BP & HR stable. Continue home medications on d/c.  Marykay Lex, M.D., M.S. [facility] [address] [place], [place]  [address]  [phone] Pager # [phone] 11/30/2012 8:47 AM

## 2012-12-24 ENCOUNTER — Encounter: Payer: Self-pay | Admitting: Cardiology

## 2012-12-24 ENCOUNTER — Ambulatory Visit (INDEPENDENT_AMBULATORY_CARE_PROVIDER_SITE_OTHER): Payer: Medicare Other | Admitting: Cardiovascular Disease

## 2012-12-24 ENCOUNTER — Encounter: Payer: Self-pay | Admitting: Cardiovascular Disease

## 2012-12-24 VITALS — BP 136/68 | HR 76 | Ht 66.0 in | Wt 134.0 lb

## 2012-12-24 DIAGNOSIS — I739 Peripheral vascular disease, unspecified: Secondary | ICD-10-CM

## 2012-12-24 DIAGNOSIS — Z79899 Other long term (current) drug therapy: Secondary | ICD-10-CM

## 2012-12-24 DIAGNOSIS — J449 Chronic obstructive pulmonary disease, unspecified: Secondary | ICD-10-CM

## 2012-12-24 DIAGNOSIS — R0602 Shortness of breath: Secondary | ICD-10-CM

## 2012-12-24 DIAGNOSIS — Z01818 Encounter for other preprocedural examination: Secondary | ICD-10-CM

## 2012-12-24 DIAGNOSIS — D689 Coagulation defect, unspecified: Secondary | ICD-10-CM

## 2012-12-24 DIAGNOSIS — I2510 Atherosclerotic heart disease of native coronary artery without angina pectoris: Secondary | ICD-10-CM

## 2012-12-24 NOTE — Progress Notes (Signed)
12/24/2012 Brenda [name]   1951-04-15  [id number]  Primary Physician Michiel Sites, MD Primary Cardiologist: Runell Gess MD Brenda [name]   HPI:  Brenda [name] is a 62 year old married Caucasian female mother of 4, patient of Dr. Onalee Hua Harding's & Maurine Minister Kohut[facility] who saw Dr. [name] approximately 2 months ago. She has a history of coronary artery disease status post PCI multiple times in the past beginning in 1999 with stents in her LAD, circumflex and RCA. Her last cardiac catheterization performed in 2007 revealed patent stents with normal LV function. She does have peripheral vascular occlusive disease status post bilateral femoropopliteal bypass grafting in 1999 and 2000 by Dr. Gretta Began. She's had a stroke in the past as well and continues to smoke. Her risk factors include include hypertension and diabetes. She saw Dr. [name] complaining of right lower extremity claudication. Lower extremity arterial Dopplers revealed a right ABI 0.59 and on the left of 1.1. Both femoropopliteal bypass grafts appear to open and there appeared to be high-grade disease versus short segment occlusion in the above-the-knee popliteal on the right beyond graft insertion. She underwent abdominal aortography with bifemoral runoff by myself on 11/26/12 revealing occluded SFAs bilaterally, patent femoropopliteal bypass grafts bilaterally I short segment occlusion of the right popliteal artery beyond graft insertion. She does have a thallium decortication. She may be a candidate for percutaneous revascularization of her chronic total occlusion.   Current Outpatient Prescriptions  Medication Sig Dispense Refill  . acetaminophen (TYLENOL) 500 MG tablet Take 1,000 mg by mouth every 6 (six) hours as needed for pain.      Marland Kitchen aspirin EC 81 MG tablet Take 81 mg by mouth daily.      Marland Kitchen buPROPion (WELLBUTRIN XL) 150 MG 24 hr tablet Take 150 mg by mouth daily.      . clopidogrel (PLAVIX) 75 MG tablet Take 75 mg by  mouth daily.       Marland Kitchen docusate sodium (COLACE) 100 MG capsule Take 100 mg by mouth daily.      . Fiber CAPS Take 2 capsules by mouth 2 (two) times daily.      . fish oil-omega-3 fatty acids 1000 MG capsule Take 1 g by mouth 2 (two) times daily.       . insulin glargine (LANTUS) 100 UNIT/ML injection Inject 16-20 Units into the skin 2 (two) times daily. 16-20 units twice a day      . insulin glulisine (APIDRA) 100 UNIT/ML injection Inject 4-8 Units into the skin 4 (four) times daily as needed for high blood sugar. For blood sugar levels      . Multiple Vitamin (MULTIVITAMIN) tablet Take 1 tablet by mouth daily.      . nitroGLYCERIN (NITROLINGUAL) 0.4 MG/SPRAY spray Place 1 spray under the tongue every 5 (five) minutes as needed for chest pain.      Marland Kitchen omeprazole (PRILOSEC) 20 MG capsule Take 20 mg by mouth daily.       . pilocarpine (SALAGEN) 5 MG tablet Take 5 mg by mouth 3 (three) times daily.      . rosuvastatin (CRESTOR) 20 MG tablet Take 20 mg by mouth daily.      Marland Kitchen tiotropium (SPIRIVA) 18 MCG inhalation capsule Place 18 mcg into inhaler and inhale daily.       No current facility-administered medications for this visit.    Allergies  Allergen Reactions  . Tape Hives  . Nicoderm (Nicotine) Rash    To the PATCH only, breakouts on  skin    History   Social History  . Marital Status: Married    Spouse Name: N/A    Number of Children: N/A  . Years of Education: N/A   Occupational History  . Not on file.   Social History Main Topics  . Smoking status: Current Every Day Smoker -- 1.00 packs/day for 50 years    Types: Cigarettes  . Smokeless tobacco: Never Used  . Alcohol Use: 2.4 oz/week    4 Glasses of wine per week  . Drug Use: No  . Sexually Active: Yes   Other Topics Concern  . Not on file   Social History Narrative  . No narrative on file     Review of Systems: General: negative for chills, fever, night sweats or weight changes.  Cardiovascular: negative for chest  pain, dyspnea on exertion, edema, orthopnea, palpitations, paroxysmal nocturnal dyspnea or shortness of breath Dermatological: negative for rash Respiratory: negative for cough or wheezing Urologic: negative for hematuria Abdominal: negative for nausea, vomiting, diarrhea, bright red blood per rectum, melena, or hematemesis Neurologic: negative for visual changes, syncope, or dizziness All other systems reviewed and are otherwise negative except as noted above.    Blood pressure 136/68, pulse 76, height 5\' 6"  (1.676 m), weight 134 lb (60.782 kg).  General appearance: alert and no distress Neck: no adenopathy, no JVD, supple, symmetrical, trachea midline, thyroid not enlarged, symmetric, no tenderness/mass/nodules and right carotid bruit Lungs: clear to auscultation bilaterally Heart: regular rate and rhythm, S1, S2 normal, no murmur, click, rub or gallop Extremities: extremities normal, atraumatic, no cyanosis or edema  EKG not performed today  ASSESSMENT AND PLAN:   PVD-Hx of BFBPG '99 and '00. Dopplers OK 6/13 I performed peripheral angiography 11/26/12 revealing occluded SFAs bilaterally, patent bilateral femoropopliteal bypass grafts with a short segment occlusion of the popliteal artery just distal to the distal anastomosis on the right with three-vessel runoff. She does have an element claudication. She may be a candidate for attempt at percutaneous transposition of the short segment CTO      Runell Gess MD [facility] 12/24/2012 12:09 PM

## 2012-12-24 NOTE — Assessment & Plan Note (Signed)
I performed peripheral angiography 11/26/12 revealing occluded SFAs bilaterally, patent bilateral femoropopliteal bypass grafts with a short segment occlusion of the popliteal artery just distal to the distal anastomosis on the right with three-vessel runoff. She does have an element claudication. She may be a candidate for attempt at percutaneous transposition of the short segment CTO

## 2012-12-24 NOTE — Patient Instructions (Addendum)
Dr. Berry has ordered a peripheral angiogram to be done at [facility].  This procedure is going to look at the bloodflow in your lower extremities.  If Dr. Berry is able to open up the arteries, you will have to spend one night in the hospital.  If he is not able to open the arteries, you will be able to go home that same day.    After the procedure, you will not be allowed to drive for 3 days or push, pull, or lift anything greater than 10 lbs for one week.    You will be required to have bloodwork and a chest xray prior to your procedure.  Our scheduler will advise you on when these items need to be done.       Reps: Scott and Tara 

## 2013-01-04 ENCOUNTER — Other Ambulatory Visit: Payer: Self-pay | Admitting: *Deleted

## 2013-01-04 DIAGNOSIS — Z01818 Encounter for other preprocedural examination: Secondary | ICD-10-CM

## 2013-01-08 ENCOUNTER — Ambulatory Visit (HOSPITAL_COMMUNITY): Admit: 2013-01-08 | Payer: Medicare Other | Admitting: Cardiovascular Disease

## 2013-01-08 ENCOUNTER — Encounter (HOSPITAL_COMMUNITY): Payer: Self-pay

## 2013-01-08 SURGERY — ANGIOGRAM, LOWER EXTREMITY
Anesthesia: LOCAL

## 2013-01-25 ENCOUNTER — Encounter: Payer: Self-pay | Admitting: Vascular Surgery

## 2013-01-26 ENCOUNTER — Ambulatory Visit (INDEPENDENT_AMBULATORY_CARE_PROVIDER_SITE_OTHER): Payer: Medicare Other | Admitting: Vascular Surgery

## 2013-01-26 ENCOUNTER — Encounter: Payer: Self-pay | Admitting: Vascular Surgery

## 2013-01-26 ENCOUNTER — Telehealth: Payer: Self-pay | Admitting: Cardiology

## 2013-01-26 VITALS — BP 137/71 | HR 87 | Ht 66.0 in | Wt 136.0 lb

## 2013-01-26 DIAGNOSIS — I739 Peripheral vascular disease, unspecified: Secondary | ICD-10-CM | POA: Insufficient documentation

## 2013-01-26 NOTE — Telephone Encounter (Signed)
Message forwarded to Dr. Harding.  

## 2013-01-26 NOTE — Progress Notes (Signed)
[facility] [place]   Patient name: Brenda [name] MRN: [id number] DOB: 07/07/1950 Sex: female   Referred by: Kohut  Reason for referral:  Chief Complaint  Patient presents with  . New Evaluation    blockage in rt leg seen on angio by Dr. Berry     HISTORY OF PRESENT ILLNESS: Patient is a 62-year-old female well-known to myself from prior bilateral femoral-popliteal bypasses. She had a very advanced Verdie Wilms atherosclerotic changes. She underwent a right femoral to above-knee popliteal bypass by myself in January of 1999 and a left femoral to below-knee popliteal bypass in January of 2000. She's done well since this time. We have not seen her in quite a few years. She recently began having severely limiting claudication in her right leg and noninvasive studies suggested arterial insufficiency. She underwent a formal arteriogram on 11/26/2012 at [facility] hospital and I have reviewed these films. This shows a widely patent femoral popliteal bypasses bilaterally. She does have complete occlusion of her popliteal artery below the bypass on the right with reconstitution of the anterior tibial and tibioperoneal trunks. She denies any rest pain or tissue loss. She is severely limited by her calf claudication. She reports that she cannot walk from one end of her hot towels to another without having to stop and rest.  Past Medical History  Diagnosis Date  . History of DVT (deep vein thrombosis)   . Coronary artery disease     multiple stens  . COPD (chronic obstructive pulmonary disease)   . Transient ischemic attack   . Depression   . Hx of radiation therapy 01/16/09 - 03/06/09    base of tongue, right neck node  . Peripheral arterial disease     status post bilateral femoropopliteal bypass graft performed by Dr. Will Schier in the past with significant right lower extremity lifestyle limiting claudication  . Pneumonia   . Hypercholesteremia   . Myocardial infarction 2005  .  Chronic bronchitis   . Shortness of breath     "w/exertion and sometimes when laying down" (11/26/2012)  . Type II diabetes mellitus   . History of blood transfusion 1955  . GERD (gastroesophageal reflux disease)   . H/O hiatal hernia   . Stroke 2000    left side weakness remains (11/26/2012)  . Arthritis     "hands" (11/26/2012)  . 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 Tee without cardioversion  07/26/2011    Procedure: TRANSESOPHAGEAL ECHOCARDIOGRAM (TEE);  Surgeon: Kenneth C. Hilty, MD;  Location: [facility];  Service: Cardiovascular;  Laterality: N/A;  . Esophagogastroduodenoscopy  12/20/2011    Procedure: ESOPHAGOGASTRODUODENOSCOPY (EGD);  Surgeon: Patrick D Hung, MD;  Location: [facility];  Service: Endoscopy;  Laterality: N/A;  EGD with balloon dilation  . Savory dilation  12/20/2011    Procedure: SAVORY DILATION;  Surgeon: Patrick D Hung, MD;  Location: [facility];  Service: Endoscopy;  Laterality: N/A;  . Tonsillectomy  1969  . Appendectomy  1962  . Cardiac catheterization  1999; 11/26/2012  . Coronary angioplasty with stent placement  1999--2005    "i think I have 3-5 total" (11/26/2012)  . Femoral bypass  1999; 2000    /notes 11/26/2012  . Carpal tunnel release Right 1980's?  . Vaginal hysterectomy  1988  . Cesarean section  1978  . Tubal ligation  1988  . Cataract extraction w/   intraocular lens implant Right 2011  . Ankle fracture surgery Right 2007    "got a metal plate and 8 screws in it" (11/26/2012)    History   Social History  . Marital Status: Married    Spouse Name: N/A    Number of Children: N/A  . Years of Education: N/A   Occupational History  . Not on file.   Social History Main Topics  . Smoking status: Current Every Day Smoker -- 1.00 packs/day for 50 years    Types: Cigarettes  . Smokeless tobacco: Never Used  . Alcohol Use: 6.0 oz/week    10  Glasses of wine per week  . Drug Use: No  . Sexual Activity: Yes   Other Topics Concern  . Not on file   Social History Narrative  . No narrative on file    Family History  Problem Relation Age of Onset  . Cancer Mother     pancreatic  . Diabetes Mother   . Hyperlipidemia Mother   . Hypertension Mother   . Other Mother     varicose vein  . Cancer Father 72    throat  . Heart disease Father   . Heart attack Father   . Cancer Sister     breast  . Diabetes Sister   . Deep vein thrombosis Brother   . Diabetes Brother   . Hearing loss Brother   . Hyperlipidemia Brother   . Hypertension Brother   . Heart attack Brother   . Clotting disorder Brother   . Diabetes Son   . Hyperlipidemia Son     Allergies as of 01/26/2013 - Review Complete 01/26/2013  Allergen Reaction Noted  . Tape Hives 05/27/2011  . Nicoderm [nicotine] Rash 01/03/2012    Current Outpatient Prescriptions on File Prior to Visit  Medication Sig Dispense Refill  . acetaminophen (TYLENOL) 500 MG tablet Take 1,000 mg by mouth every 6 (six) hours as needed for pain.      . aspirin EC 81 MG tablet Take 81 mg by mouth daily.      . buPROPion (WELLBUTRIN XL) 150 MG 24 hr tablet Take 150 mg by mouth daily.      . clopidogrel (PLAVIX) 75 MG tablet Take 75 mg by mouth daily.       . docusate sodium (COLACE) 100 MG capsule Take 100 mg by mouth daily.      . Fiber CAPS Take 2 capsules by mouth 2 (two) times daily.      . fish oil-omega-3 fatty acids 1000 MG capsule Take 1 g by mouth 2 (two) times daily.       . insulin glargine (LANTUS) 100 UNIT/ML injection Inject 16-20 Units into the skin 2 (two) times daily. 16-20 units twice a day      . insulin glulisine (APIDRA) 100 UNIT/ML injection Inject 4-8 Units into the skin 4 (four) times daily as needed for high blood sugar. For blood sugar levels      . Multiple Vitamin (MULTIVITAMIN) tablet Take 1 tablet by mouth daily.      . nitroGLYCERIN (NITROLINGUAL) 0.4  MG/SPRAY spray Place 1 spray under the tongue every 5 (five) minutes as needed for chest pain.      . omeprazole (PRILOSEC) 20 MG capsule Take 20 mg by mouth daily.       . pilocarpine (SALAGEN) 5 MG tablet Take 5 mg by mouth 3 (three) times daily.      . rosuvastatin (CRESTOR) 20 MG tablet   Take 20 mg by mouth daily.      . tiotropium (SPIRIVA) 18 MCG inhalation capsule Place 18 mcg into inhaler and inhale daily.       No current facility-administered medications on file prior to visit.     REVIEW OF SYSTEMS:  Positives indicated with an "X"  CARDIOVASCULAR:  [ ] chest pain   [ ] chest pressure   [ ] palpitations   [ ] orthopnea   [ ] dyspnea on exertion   [x ] claudication   [ ] rest pain   [ ] DVT   [ ] phlebitis PULMONARY:   [ ] productive cough   [ ] asthma   [ ] wheezing NEUROLOGIC:   [ ] weakness  [ ] paresthesias  [ ] aphasia  [ ] amaurosis  [ ] dizziness HEMATOLOGIC:   [ ] bleeding problems   [ ] clotting disorders MUSCULOSKELETAL:  [ ] joint pain   [ ] joint swelling GASTROINTESTINAL: [ ]  blood in stool  [ ]  hematemesis GENITOURINARY:  [ ]  dysuria  [ ]  hematuria PSYCHIATRIC:  [ ] history of major depression INTEGUMENTARY:  [ ] rashes  [ ] ulcers CONSTITUTIONAL:  [ ] fever   [ ] chills  PHYSICAL EXAMINATION:  General: The patient is a well-nourished female, in no acute distress. Vital signs are BP 137/71  Pulse 87  Ht 5' 6" (1.676 m)  Wt 136 lb (61.689 kg)  BMI 21.96 kg/m2  SpO2 98% Pulmonary: There is a good air exchange bilaterally without wheezing or rales. Abdomen: Soft and non-tender with normal pitch bowel sounds. Musculoskeletal: There are no major deformities.  There is no significant extremity pain. Neurologic: No focal weakness or paresthesias are detected, Skin: There are no ulcer or rashes noted. Psychiatric: The patient has normal affect. Cardiovascular: There is a regular rate and rhythm without significant murmur appreciated. Pulse status: 2+  radial 2+ femoral 2+ left popliteal and 2+ left dorsalis pedis pulse. Absent pedal pulses on the right     Impression and Plan:  Severe limiting claudication right leg with occlusion of her popliteal artery below the patent right femoral to above-knee popliteal bypass. I did reimage her veins with sinusitis she does have acceptable caliber small saphenous vein for bypass. I've recommended bypass from her existing above-knee popliteal to or below knee popliteal artery. I feel that she would be very high risk for failure with endovascular treatment since this is behind her knee. She understands and we'll proceed as soon as she has cardiac clearance from Dr. Harding    Paula Busenbark [facility] [place] Office: [phone]         

## 2013-01-26 NOTE — Telephone Encounter (Signed)
I do not think so -- had one last year that was negative.  Not having angina when I saw her ~2 months ago before her Peripheral Angio.   Marykay Lex, MD

## 2013-01-26 NOTE — Telephone Encounter (Signed)
Dr. Arbie Cookey is going to do an above and below knee pop bypass.  He needs to know if patient needs cardiolite or any other surg clearance prior to scheduling procedure.

## 2013-01-27 NOTE — Telephone Encounter (Signed)
Returned call and spoke w/ Annabelle Harman.  Informed Dr. [name] responded to message and note will be routed to Dr. Arbie Cookey.  Annabelle Harman asked that she be copied in message.  Informed will do.  Message forwarded to Dr. Arbie Cookey and Berton Mount.

## 2013-01-27 NOTE — Telephone Encounter (Signed)
Returned call.  Office opens at [employer].  Will call back.  Message will be forwarded to Dr. Lacinda Axon In Basket as well.

## 2013-01-28 ENCOUNTER — Other Ambulatory Visit: Payer: Self-pay | Admitting: *Deleted

## 2013-01-28 ENCOUNTER — Encounter: Payer: Self-pay | Admitting: *Deleted

## 2013-01-29 ENCOUNTER — Encounter (HOSPITAL_COMMUNITY): Payer: Self-pay | Admitting: Pharmacy Technician

## 2013-02-04 ENCOUNTER — Encounter (HOSPITAL_COMMUNITY)
Admission: RE | Admit: 2013-02-04 | Discharge: 2013-02-04 | Disposition: A | Discharge status: Home / Self Care | Payer: Medicare Other | Source: Ambulatory Visit | Attending: Vascular Surgery | Admitting: Vascular Surgery

## 2013-02-04 ENCOUNTER — Other Ambulatory Visit: Payer: Self-pay | Admitting: *Deleted

## 2013-02-04 ENCOUNTER — Encounter (HOSPITAL_COMMUNITY): Payer: Self-pay

## 2013-02-04 ENCOUNTER — Encounter: Payer: Self-pay | Admitting: Endocrinology

## 2013-02-04 ENCOUNTER — Telehealth: Payer: Self-pay | Admitting: *Deleted

## 2013-02-04 VITALS — BP 115/74 | HR 81 | Temp 98.3°F | Resp 20 | Ht 66.0 in | Wt 134.5 lb

## 2013-02-04 DIAGNOSIS — Z01818 Encounter for other preprocedural examination: Secondary | ICD-10-CM

## 2013-02-04 DIAGNOSIS — Z22322 Carrier or suspected carrier of Methicillin resistant Staphylococcus aureus: Secondary | ICD-10-CM

## 2013-02-04 LAB — COMPREHENSIVE METABOLIC PANEL
AST: 14 U/L (ref 0–37)
Albumin: 3.8 g/dL (ref 3.5–5.2)
BUN: 14 mg/dL (ref 6–23)
Creatinine, Ser: 0.87 mg/dL (ref 0.50–1.10)
Potassium: 3.9 mEq/L (ref 3.5–5.1)
Total Protein: 7.3 g/dL (ref 6.0–8.3)

## 2013-02-04 LAB — TYPE AND SCREEN: Antibody Screen: NEGATIVE

## 2013-02-04 LAB — PROTIME-INR
INR: 0.87 (ref 0.00–1.49)
Prothrombin Time: 11.7 seconds (ref 11.6–15.2)

## 2013-02-04 LAB — ABO/RH: ABO/RH(D): O POS

## 2013-02-04 LAB — CBC
HCT: 47.8 % — ABNORMAL HIGH (ref 36.0–46.0)
MCHC: 35.8 g/dL (ref 30.0–36.0)
MCV: 91.4 fL (ref 78.0–100.0)
Platelets: 188 10*3/uL (ref 150–400)
RDW: 13.5 % (ref 11.5–15.5)

## 2013-02-04 LAB — URINALYSIS, ROUTINE W REFLEX MICROSCOPIC
Bilirubin Urine: NEGATIVE
Ketones, ur: NEGATIVE mg/dL
Nitrite: NEGATIVE
Protein, ur: NEGATIVE mg/dL
Urobilinogen, UA: 1 mg/dL (ref 0.0–1.0)

## 2013-02-04 LAB — APTT: aPTT: 31 seconds (ref 24–37)

## 2013-02-04 LAB — SURGICAL PCR SCREEN: MRSA, PCR: POSITIVE — AB

## 2013-02-04 MED ORDER — DEXTROSE 5 % IV SOLN
1.5000 g | INTRAVENOUS | Status: AC
  Administered 2013-02-05: 1.5 g via INTRAVENOUS
  Filled 2013-02-04: qty 1.5

## 2013-02-04 MED ORDER — ASPIRIN 81 MG PO CHEW: 324.0000 mg | CHEWABLE_TABLET | ORAL | Status: DC

## 2013-02-04 MED ORDER — SODIUM CHLORIDE 0.9 % IV SOLN: INTRAVENOUS | Status: DC

## 2013-02-04 MED ORDER — MUPIROCIN 2 % EX OINT: TOPICAL_OINTMENT | CUTANEOUS | Status: DC

## 2013-02-04 MED ORDER — SODIUM CHLORIDE 0.9 % IJ SOLN: 3.0000 mL | INTRAMUSCULAR | Status: DC | PRN

## 2013-02-04 MED ORDER — DIAZEPAM 5 MG PO TABS: 5.0000 mg | ORAL_TABLET | ORAL | Status: DC

## 2013-02-04 NOTE — Progress Notes (Signed)
Anesthesia Chart Review:  Patient is a 62 year old female scheduled for right AK to BK popliteal artery bypass on 02/05/13 by Dr. Arbie Cookey. PAT was pm 02/04/13.  History includes smoking, CAD s/p LAD, CX, RCA stents '99, MI '05, COPD, chronic DOE, PAD s/p bilateral FPBG '99/'00, GERD, hiatal hernia, DM2, hypercholesterolemia, CVA '00, TIA 07/2011, tongue cancer s/p radiation '10.  Primary cardiologist is Dr. [name] who did not feel that patient would need additional cardiac testing pre-operatively.  EKG on 11/26/12 showed NSR.  Cardiology notes indicate that she had a "negative" stress test last year; however, the official report was not requested at her PAT visit so will not be available for review preoperatively.  TEE on 07/26/11 showed: - Left ventricle: There was mild concentric hypertrophy. Systolic function was normal. The estimated ejection fraction was in the range of 55% to 60%. - Aortic valve: No evidence of vegetation. - Mitral valve: No evidence of vegetation. Trivial regurgitation. - Left atrium: No evidence of thrombus in the atrial cavity or appendage. - Right atrium: No evidence of thrombus in the atrial cavity or appendage. - Atrial septum: There is a mobile interatrial septum. There is a very small PFO with a few bubbles crossing from right to left. There is no evidence for left to right shunting by color doppler. - Tricuspid valve: No evidence of vegetation. Trivial regurgitation. - Pulmonic valve: No evidence of vegetation.  Carotid duplex on 07/25/11 showed: BILATERAL: Mild irregular calcific and non calcific plaque in the bifurcations and proximal ICAs. There is extension of plaque a little further in the left ICA. No significant ICA stenosis.  CXR on 02/04/13 showed COPD changes, no acute abnormalities.  Preoperative labs noted.  Her cardiologist is aware of plans for surgery and did not recommend additional testing.  If no acute changes then I would anticipate that she could  proceed as planned.  Velna Ochs [facility]/Anesthesiology Phone [phone] 02/04/2013 6:31 PM

## 2013-02-04 NOTE — Telephone Encounter (Signed)
Olegario Messier from [facility] called to say Mrs Staheli nasal swap was positive for MRSA and Staph. She asked for [place] to order Mupirocin for Mrs Manrique. I called Mrs. Bordeaux to find out which pharmacy she wanted this to go to. She told me [facility] on [address] and said Olegario Messier had also called her with the results. I faxed in a prescription.

## 2013-02-04 NOTE — Pre-Procedure Instructions (Signed)
Brenda [name]  02/04/2013   Your procedure is scheduled on:  02/05/13  Report to [facility] at 530 AM.  Call this number if you have problems the morning of surgery: [phone]   Remember:   Do not eat food or drink liquids after midnight.   Take these medicines the morning of surgery with A SIP OF WATER: all inhalers,prilosec,ntg   Do not wear jewelry, make-up or nail polish.  Do not wear lotions, powders, or perfumes. You may wear deodorant.  Do not shave 48 hours prior to surgery. Men may shave face and neck.  Do not bring valuables to the hospital.  [facility] is not responsible                   for any belongings or valuables.  Contacts, dentures or bridgework may not be worn into surgery.  Leave suitcase in the car. After surgery it may be brought to your room.  For patients admitted to the hospital, checkout time is 11:00 AM the day of  discharge.   Patients discharged the day of surgery will not be allowed to drive  home.  Name and phone number of your driver: family  Special Instructions: Shower using CHG 2 nights before surgery and the night before surgery.  If you shower the day of surgery use CHG.  Use special wash - you have one bottle of CHG for all showers.  You should use approximately 1/3 of the bottle for each shower.   Please read over the following fact sheets that you were given: Pain Booklet, Coughing and Deep Breathing, Blood Transfusion Information, MRSA Information and Surgical Site Infection Prevention

## 2013-02-05 ENCOUNTER — Encounter (HOSPITAL_COMMUNITY): Payer: Self-pay | Admitting: Anesthesiology

## 2013-02-05 ENCOUNTER — Telehealth: Payer: Self-pay | Admitting: Vascular Surgery

## 2013-02-05 ENCOUNTER — Inpatient Hospital Stay (HOSPITAL_COMMUNITY)
Admission: RE | Admit: 2013-02-05 | Discharge: 2013-02-09 | DRG: 254 | Disposition: A | Discharge status: Home / Self Care | Payer: Medicare Other | Source: Ambulatory Visit | Attending: Vascular Surgery | Admitting: Vascular Surgery

## 2013-02-05 ENCOUNTER — Inpatient Hospital Stay (HOSPITAL_COMMUNITY): Payer: Medicare Other | Admitting: Anesthesiology

## 2013-02-05 ENCOUNTER — Encounter (HOSPITAL_COMMUNITY): Payer: Self-pay | Admitting: *Deleted

## 2013-02-05 ENCOUNTER — Encounter (HOSPITAL_COMMUNITY)
Admission: RE | Disposition: A | Discharge status: Home / Self Care | Payer: Self-pay | Source: Ambulatory Visit | Attending: Vascular Surgery

## 2013-02-05 DIAGNOSIS — I2510 Atherosclerotic heart disease of native coronary artery without angina pectoris: Secondary | ICD-10-CM | POA: Diagnosis present

## 2013-02-05 DIAGNOSIS — J449 Chronic obstructive pulmonary disease, unspecified: Secondary | ICD-10-CM | POA: Diagnosis present

## 2013-02-05 DIAGNOSIS — I739 Peripheral vascular disease, unspecified: Secondary | ICD-10-CM | POA: Diagnosis present

## 2013-02-05 DIAGNOSIS — K219 Gastro-esophageal reflux disease without esophagitis: Secondary | ICD-10-CM | POA: Diagnosis present

## 2013-02-05 DIAGNOSIS — E785 Hyperlipidemia, unspecified: Secondary | ICD-10-CM | POA: Diagnosis present

## 2013-02-05 DIAGNOSIS — T82898A Other specified complication of vascular prosthetic devices, implants and grafts, initial encounter: Secondary | ICD-10-CM

## 2013-02-05 DIAGNOSIS — F17200 Nicotine dependence, unspecified, uncomplicated: Secondary | ICD-10-CM | POA: Diagnosis present

## 2013-02-05 DIAGNOSIS — E119 Type 2 diabetes mellitus without complications: Secondary | ICD-10-CM | POA: Diagnosis present

## 2013-02-05 DIAGNOSIS — Z8673 Personal history of transient ischemic attack (TIA), and cerebral infarction without residual deficits: Secondary | ICD-10-CM

## 2013-02-05 DIAGNOSIS — E7800 Pure hypercholesterolemia, unspecified: Secondary | ICD-10-CM | POA: Diagnosis present

## 2013-02-05 DIAGNOSIS — I70219 Atherosclerosis of native arteries of extremities with intermittent claudication, unspecified extremity: Secondary | ICD-10-CM | POA: Diagnosis present

## 2013-02-05 DIAGNOSIS — I252 Old myocardial infarction: Secondary | ICD-10-CM

## 2013-02-05 DIAGNOSIS — Z86718 Personal history of other venous thrombosis and embolism: Secondary | ICD-10-CM

## 2013-02-05 DIAGNOSIS — I743 Embolism and thrombosis of arteries of the lower extremities: Principal | ICD-10-CM | POA: Diagnosis present

## 2013-02-05 DIAGNOSIS — R29898 Other symptoms and signs involving the musculoskeletal system: Secondary | ICD-10-CM | POA: Diagnosis present

## 2013-02-05 DIAGNOSIS — J4489 Other specified chronic obstructive pulmonary disease: Secondary | ICD-10-CM | POA: Diagnosis present

## 2013-02-05 DIAGNOSIS — R1310 Dysphagia, unspecified: Secondary | ICD-10-CM | POA: Diagnosis present

## 2013-02-05 HISTORY — PX: BYPASS GRAFT POPLITEAL TO POPLITEAL: SHX5763

## 2013-02-05 LAB — CBC
HCT: 41.6 % (ref 36.0–46.0)
Hemoglobin: 14.3 g/dL (ref 12.0–15.0)
MCH: 31.8 pg (ref 26.0–34.0)
MCHC: 34.4 g/dL (ref 30.0–36.0)
MCV: 92.4 fL (ref 78.0–100.0)

## 2013-02-05 LAB — GLUCOSE, CAPILLARY
Glucose-Capillary: 189 mg/dL — ABNORMAL HIGH (ref 70–99)
Glucose-Capillary: 148 mg/dL — ABNORMAL HIGH (ref 70–99)
Glucose-Capillary: 88 mg/dL (ref 70–99)
Glucose-Capillary: 132 mg/dL — ABNORMAL HIGH (ref 70–99)

## 2013-02-05 LAB — CREATININE, SERUM: Creatinine, Ser: 0.68 mg/dL (ref 0.50–1.10)

## 2013-02-05 SURGERY — CREATION, BYPASS, ARTERIAL, POPLITEAL
Anesthesia: General | Site: Leg Lower | Laterality: Right | Wound class: Clean

## 2013-02-05 MED ORDER — HYDROMORPHONE HCL PF 1 MG/ML IJ SOLN
0.2500 mg | INTRAMUSCULAR | Status: DC | PRN
  Administered 2013-02-05 (×4): 0.5 mg via INTRAVENOUS

## 2013-02-05 MED ORDER — HYDRALAZINE HCL 20 MG/ML IJ SOLN: 10.0000 mg | INTRAMUSCULAR | Status: DC | PRN

## 2013-02-05 MED ORDER — ACETAMINOPHEN 650 MG RE SUPP: 325.0000 mg | RECTAL | Status: DC | PRN

## 2013-02-05 MED ORDER — PHENYLEPHRINE HCL 10 MG/ML IJ SOLN
10.0000 mg | INTRAVENOUS | Status: DC | PRN
  Administered 2013-02-05: 50 ug/min via INTRAVENOUS

## 2013-02-05 MED ORDER — ONDANSETRON HCL 4 MG/2ML IJ SOLN: 4.0000 mg | Freq: Once | INTRAMUSCULAR | Status: DC | PRN

## 2013-02-05 MED ORDER — PANTOPRAZOLE SODIUM 40 MG PO TBEC
40.0000 mg | DELAYED_RELEASE_TABLET | Freq: Every day | ORAL | Status: DC
  Administered 2013-02-06 – 2013-02-09 (×4): 40 mg via ORAL
  Filled 2013-02-05 (×5): qty 1

## 2013-02-05 MED ORDER — ALUM & MAG HYDROXIDE-SIMETH 200-200-20 MG/5ML PO SUSP: 15.0000 mL | ORAL | Status: DC | PRN

## 2013-02-05 MED ORDER — OXYCODONE HCL 5 MG PO TABS
5.0000 mg | ORAL_TABLET | ORAL | Status: DC | PRN
  Administered 2013-02-06 – 2013-02-07 (×3): 10 mg via ORAL
  Administered 2013-02-08: 5 mg via ORAL
  Filled 2013-02-05 (×4): qty 2
  Filled 2013-02-05: qty 1

## 2013-02-05 MED ORDER — MUPIROCIN 2 % EX OINT
1.0000 "application " | TOPICAL_OINTMENT | Freq: Every day | CUTANEOUS | Status: DC
  Administered 2013-02-05 – 2013-02-06 (×2): 1 via TOPICAL
  Filled 2013-02-05: qty 22

## 2013-02-05 MED ORDER — ASPIRIN EC 81 MG PO TBEC
81.0000 mg | DELAYED_RELEASE_TABLET | Freq: Every day | ORAL | Status: DC
  Administered 2013-02-06 – 2013-02-09 (×4): 81 mg via ORAL
  Filled 2013-02-05 (×4): qty 1

## 2013-02-05 MED ORDER — DOPAMINE-DEXTROSE 3.2-5 MG/ML-% IV SOLN: 3.0000 ug/kg/min | INTRAVENOUS | Status: DC

## 2013-02-05 MED ORDER — DOCUSATE SODIUM 100 MG PO CAPS
100.0000 mg | ORAL_CAPSULE | Freq: Every day | ORAL | Status: DC
  Administered 2013-02-06 – 2013-02-09 (×4): 100 mg via ORAL
  Filled 2013-02-05 (×4): qty 1

## 2013-02-05 MED ORDER — ONDANSETRON 4 MG PO TBDP
4.0000 mg | ORAL_TABLET | Freq: Three times a day (TID) | ORAL | Status: DC | PRN
  Filled 2013-02-05: qty 1

## 2013-02-05 MED ORDER — ZOLPIDEM TARTRATE 5 MG PO TABS: 5.0000 mg | ORAL_TABLET | Freq: Every evening | ORAL | Status: DC | PRN

## 2013-02-05 MED ORDER — PILOCARPINE HCL 5 MG PO TABS
5.0000 mg | ORAL_TABLET | Freq: Three times a day (TID) | ORAL | Status: DC
  Administered 2013-02-07 – 2013-02-09 (×2): 5 mg via ORAL
  Filled 2013-02-05 (×14): qty 1

## 2013-02-05 MED ORDER — TRIAMCINOLONE ACETONIDE 0.5 % EX CREA
1.0000 "application " | TOPICAL_CREAM | Freq: Two times a day (BID) | CUTANEOUS | Status: DC
  Administered 2013-02-05 – 2013-02-06 (×2): 1 via TOPICAL
  Filled 2013-02-05 (×2): qty 15

## 2013-02-05 MED ORDER — TIOTROPIUM BROMIDE MONOHYDRATE 18 MCG IN CAPS: 18.0000 ug | ORAL_CAPSULE | Freq: Every day | RESPIRATORY_TRACT | Status: DC | PRN

## 2013-02-05 MED ORDER — MEPERIDINE HCL 25 MG/ML IJ SOLN: 6.2500 mg | INTRAMUSCULAR | Status: DC | PRN

## 2013-02-05 MED ORDER — ACETAMINOPHEN 325 MG PO TABS: 325.0000 mg | ORAL_TABLET | ORAL | Status: DC | PRN

## 2013-02-05 MED ORDER — OXYCODONE HCL 5 MG/5ML PO SOLN: 5.0000 mg | Freq: Once | ORAL | Status: DC | PRN

## 2013-02-05 MED ORDER — ALBUMIN HUMAN 5 % IV SOLN
INTRAVENOUS | Status: DC | PRN
  Administered 2013-02-05: 09:00:00 via INTRAVENOUS

## 2013-02-05 MED ORDER — ONDANSETRON HCL 4 MG/2ML IJ SOLN
INTRAMUSCULAR | Status: DC | PRN
  Administered 2013-02-05: 4 mg via INTRAVENOUS

## 2013-02-05 MED ORDER — HYDROMORPHONE HCL PF 1 MG/ML IJ SOLN
INTRAMUSCULAR | Status: AC
  Filled 2013-02-05: qty 1

## 2013-02-05 MED ORDER — PHENYLEPHRINE HCL 10 MG/ML IJ SOLN
INTRAMUSCULAR | Status: DC | PRN
  Administered 2013-02-05: 120 ug via INTRAVENOUS
  Administered 2013-02-05 (×2): 80 ug via INTRAVENOUS
  Administered 2013-02-05: 120 ug via INTRAVENOUS

## 2013-02-05 MED ORDER — GUAIFENESIN-DM 100-10 MG/5ML PO SYRP: 15.0000 mL | ORAL_SOLUTION | ORAL | Status: DC | PRN

## 2013-02-05 MED ORDER — SODIUM CHLORIDE 0.9 % IV SOLN: 500.0000 mL | Freq: Once | INTRAVENOUS | Status: AC | PRN

## 2013-02-05 MED ORDER — MORPHINE SULFATE 2 MG/ML IJ SOLN
INTRAMUSCULAR | Status: AC
  Administered 2013-02-05: 2 mg via INTRAVENOUS
  Filled 2013-02-05: qty 1

## 2013-02-05 MED ORDER — CALCIUM POLYCARBOPHIL 625 MG PO TABS
1250.0000 mg | ORAL_TABLET | Freq: Every day | ORAL | Status: DC
  Administered 2013-02-09: 1250 mg via ORAL
  Filled 2013-02-05 (×4): qty 2

## 2013-02-05 MED ORDER — ONDANSETRON HCL 4 MG/2ML IJ SOLN
4.0000 mg | Freq: Four times a day (QID) | INTRAMUSCULAR | Status: DC | PRN
Start: 2013-02-05 — End: 2013-02-09
  Administered 2013-02-06: 4 mg via INTRAVENOUS
  Filled 2013-02-05: qty 2

## 2013-02-05 MED ORDER — OMEGA-3-ACID ETHYL ESTERS 1 G PO CAPS
1.0000 g | ORAL_CAPSULE | Freq: Two times a day (BID) | ORAL | Status: DC
  Administered 2013-02-05 – 2013-02-09 (×8): 1 g via ORAL
  Filled 2013-02-05 (×9): qty 1

## 2013-02-05 MED ORDER — NEOSTIGMINE METHYLSULFATE 1 MG/ML IJ SOLN
INTRAMUSCULAR | Status: DC | PRN
  Administered 2013-02-05: 3 mg via INTRAVENOUS

## 2013-02-05 MED ORDER — LABETALOL HCL 5 MG/ML IV SOLN
10.0000 mg | INTRAVENOUS | Status: DC | PRN
  Filled 2013-02-05: qty 4

## 2013-02-05 MED ORDER — OXYCODONE HCL 5 MG PO TABS: 5.0000 mg | ORAL_TABLET | Freq: Four times a day (QID) | ORAL | Status: DC | PRN

## 2013-02-05 MED ORDER — ADULT MULTIVITAMIN W/MINERALS CH
1.0000 | ORAL_TABLET | Freq: Every day | ORAL | Status: DC
  Administered 2013-02-09: 1 via ORAL
  Filled 2013-02-05 (×4): qty 1

## 2013-02-05 MED ORDER — BUPROPION HCL ER (XL) 150 MG PO TB24
150.0000 mg | ORAL_TABLET | Freq: Every day | ORAL | Status: DC
  Administered 2013-02-06 – 2013-02-09 (×4): 150 mg via ORAL
  Filled 2013-02-05 (×5): qty 1

## 2013-02-05 MED ORDER — LACTATED RINGERS IV SOLN
INTRAVENOUS | Status: DC | PRN
  Administered 2013-02-05 (×3): via INTRAVENOUS

## 2013-02-05 MED ORDER — DEXTROSE 5 % IV SOLN
1.5000 g | Freq: Two times a day (BID) | INTRAVENOUS | Status: AC
  Administered 2013-02-05: 1.5 g via INTRAVENOUS
  Filled 2013-02-05 (×3): qty 1.5

## 2013-02-05 MED ORDER — ENOXAPARIN SODIUM 40 MG/0.4ML ~~LOC~~ SOLN
40.0000 mg | SUBCUTANEOUS | Status: DC
  Administered 2013-02-06 – 2013-02-09 (×2): 40 mg via SUBCUTANEOUS
  Filled 2013-02-05 (×4): qty 0.4

## 2013-02-05 MED ORDER — HEPARIN SODIUM (PORCINE) 1000 UNIT/ML IJ SOLN
INTRAMUSCULAR | Status: DC | PRN
  Administered 2013-02-05: 6000 [IU] via INTRAVENOUS

## 2013-02-05 MED ORDER — ATORVASTATIN CALCIUM 10 MG PO TABS
10.0000 mg | ORAL_TABLET | Freq: Every day | ORAL | Status: DC
  Filled 2013-02-05 (×5): qty 1

## 2013-02-05 MED ORDER — PHENOL 1.4 % MT LIQD: 1.0000 | OROMUCOSAL | Status: DC | PRN

## 2013-02-05 MED ORDER — ROCURONIUM BROMIDE 100 MG/10ML IV SOLN
INTRAVENOUS | Status: DC | PRN
  Administered 2013-02-05: 50 mg via INTRAVENOUS

## 2013-02-05 MED ORDER — FENTANYL CITRATE 0.05 MG/ML IJ SOLN
INTRAMUSCULAR | Status: DC | PRN
  Administered 2013-02-05: 50 ug via INTRAVENOUS
  Administered 2013-02-05: 150 ug via INTRAVENOUS
  Administered 2013-02-05: 50 ug via INTRAVENOUS

## 2013-02-05 MED ORDER — FIBER PO CAPS: 2.0000 | ORAL_CAPSULE | Freq: Every day | ORAL | Status: DC

## 2013-02-05 MED ORDER — PROTAMINE SULFATE 10 MG/ML IV SOLN
INTRAVENOUS | Status: DC | PRN
  Administered 2013-02-05 (×5): 10 mg via INTRAVENOUS

## 2013-02-05 MED ORDER — MORPHINE SULFATE 2 MG/ML IJ SOLN: 2.0000 mg | INTRAMUSCULAR | Status: DC | PRN

## 2013-02-05 MED ORDER — CLOPIDOGREL BISULFATE 75 MG PO TABS
75.0000 mg | ORAL_TABLET | Freq: Every day | ORAL | Status: DC
Start: 2013-02-06 — End: 2013-02-09
  Administered 2013-02-06 – 2013-02-09 (×4): 75 mg via ORAL
  Filled 2013-02-05 (×4): qty 1

## 2013-02-05 MED ORDER — LIDOCAINE HCL (CARDIAC) 20 MG/ML IV SOLN
INTRAVENOUS | Status: DC | PRN
  Administered 2013-02-05: 100 mg via INTRAVENOUS

## 2013-02-05 MED ORDER — MIDAZOLAM HCL 5 MG/5ML IJ SOLN
INTRAMUSCULAR | Status: DC | PRN
  Administered 2013-02-05: 2 mg via INTRAVENOUS

## 2013-02-05 MED ORDER — NITROGLYCERIN 0.4 MG/SPRAY TL SOLN
1.0000 | Status: DC | PRN
  Filled 2013-02-05: qty 4.9

## 2013-02-05 MED ORDER — POTASSIUM CHLORIDE CRYS ER 20 MEQ PO TBCR: 20.0000 meq | EXTENDED_RELEASE_TABLET | Freq: Once | ORAL | Status: AC | PRN

## 2013-02-05 MED ORDER — PROPOFOL 10 MG/ML IV BOLUS
INTRAVENOUS | Status: DC | PRN
  Administered 2013-02-05: 120 mg via INTRAVENOUS

## 2013-02-05 MED ORDER — SODIUM CHLORIDE 0.9 % IV SOLN
INTRAVENOUS | Status: DC
  Administered 2013-02-05: 75 mL/h via INTRAVENOUS

## 2013-02-05 MED ORDER — INSULIN ASPART 100 UNIT/ML ~~LOC~~ SOLN
0.0000 [IU] | Freq: Three times a day (TID) | SUBCUTANEOUS | Status: DC
  Administered 2013-02-06: 2 [IU] via SUBCUTANEOUS
  Administered 2013-02-07: 5 [IU] via SUBCUTANEOUS
  Administered 2013-02-07: 2 [IU] via SUBCUTANEOUS
  Administered 2013-02-08: 5 [IU] via SUBCUTANEOUS
  Administered 2013-02-08: 2 [IU] via SUBCUTANEOUS
  Administered 2013-02-09: 3 [IU] via SUBCUTANEOUS

## 2013-02-05 MED ORDER — GLYCOPYRROLATE 0.2 MG/ML IJ SOLN
INTRAMUSCULAR | Status: DC | PRN
  Administered 2013-02-05: 0.4 mg via INTRAVENOUS

## 2013-02-05 MED ORDER — OXYCODONE HCL 5 MG PO TABS: 5.0000 mg | ORAL_TABLET | Freq: Once | ORAL | Status: DC | PRN

## 2013-02-05 MED ORDER — 0.9 % SODIUM CHLORIDE (POUR BTL) OPTIME
TOPICAL | Status: DC | PRN
  Administered 2013-02-05: 2000 mL

## 2013-02-05 MED ORDER — METOPROLOL TARTRATE 1 MG/ML IV SOLN: 2.0000 mg | INTRAVENOUS | Status: DC | PRN

## 2013-02-05 MED ORDER — SODIUM CHLORIDE 0.9 % IR SOLN
Status: DC | PRN
  Administered 2013-02-05: 08:00:00

## 2013-02-05 SURGICAL SUPPLY — 62 items
APL SKNCLS STERI-STRIP NONHPOA (GAUZE/BANDAGES/DRESSINGS) ×1
BANDAGE ESMARK 6X9 LF (GAUZE/BANDAGES/DRESSINGS) IMPLANT
BANDAGE GAUZE ELAST BULKY 4 IN (GAUZE/BANDAGES/DRESSINGS) ×3 IMPLANT
BENZOIN TINCTURE PRP APPL 2/3 (GAUZE/BANDAGES/DRESSINGS) ×2 IMPLANT
BNDG CMPR 9X6 STRL LF SNTH (GAUZE/BANDAGES/DRESSINGS) ×1
BNDG ESMARK 6X9 LF (GAUZE/BANDAGES/DRESSINGS) ×2
CANISTER SUCTION 2500CC (MISCELLANEOUS) ×2 IMPLANT
CANNULA VESSEL W/WING WO/VALVE (CANNULA) ×2 IMPLANT
CLIP FOGARTY SPRING 6M (CLIP) IMPLANT
CLIP LIGATING EXTRA MED SLVR (CLIP) ×2 IMPLANT
CLIP LIGATING EXTRA SM BLUE (MISCELLANEOUS) ×2 IMPLANT
CLOTH BEACON ORANGE TIMEOUT ST (SAFETY) ×2 IMPLANT
COVER PROBE W GEL 5X96 (DRAPES) ×1 IMPLANT
COVER SURGICAL LIGHT HANDLE (MISCELLANEOUS) ×2 IMPLANT
CUFF TOURNIQUET SINGLE 24IN (TOURNIQUET CUFF) ×1 IMPLANT
CUFF TOURNIQUET SINGLE 34IN LL (TOURNIQUET CUFF) IMPLANT
CUFF TOURNIQUET SINGLE 44IN (TOURNIQUET CUFF) IMPLANT
DRAIN SNY 10X20 3/4 PERF (WOUND CARE) IMPLANT
DRAPE PROXIMA HALF (DRAPES) ×1 IMPLANT
DRAPE WARM FLUID 44X44 (DRAPE) ×2 IMPLANT
DRAPE X-RAY CASS 24X20 (DRAPES) IMPLANT
DRSG COVADERM 4X10 (GAUZE/BANDAGES/DRESSINGS) IMPLANT
DRSG COVADERM 4X8 (GAUZE/BANDAGES/DRESSINGS) IMPLANT
ELECT REM PT RETURN 9FT ADLT (ELECTROSURGICAL) ×2
ELECTRODE REM PT RTRN 9FT ADLT (ELECTROSURGICAL) ×1 IMPLANT
EVACUATOR SILICONE 100CC (DRAIN) IMPLANT
GLOVE BIO SURGEON STRL SZ 6.5 (GLOVE) ×2 IMPLANT
GLOVE BIOGEL PI IND STRL 6.5 (GLOVE) IMPLANT
GLOVE BIOGEL PI IND STRL 7.0 (GLOVE) IMPLANT
GLOVE BIOGEL PI INDICATOR 6.5 (GLOVE) ×1
GLOVE BIOGEL PI INDICATOR 7.0 (GLOVE) ×2
GLOVE ECLIPSE 6.0 STRL STRAW (GLOVE) ×1 IMPLANT
GLOVE SS BIOGEL STRL SZ 7.5 (GLOVE) ×1 IMPLANT
GLOVE SUPERSENSE BIOGEL SZ 7.5 (GLOVE) ×1
GLOVE SURG SS PI 6.5 STRL IVOR (GLOVE) ×2 IMPLANT
GOWN PREVENTION PLUS XLARGE (GOWN DISPOSABLE) ×2 IMPLANT
GOWN STRL NON-REIN LRG LVL3 (GOWN DISPOSABLE) ×6 IMPLANT
INSERT FOGARTY SM (MISCELLANEOUS) IMPLANT
KIT BASIN OR (CUSTOM PROCEDURE TRAY) ×2 IMPLANT
KIT ROOM TURNOVER OR (KITS) ×2 IMPLANT
NS IRRIG 1000ML POUR BTL (IV SOLUTION) ×4 IMPLANT
PACK PERIPHERAL VASCULAR (CUSTOM PROCEDURE TRAY) ×2 IMPLANT
PAD ARMBOARD 7.5X6 YLW CONV (MISCELLANEOUS) ×4 IMPLANT
PADDING CAST COTTON 6X4 STRL (CAST SUPPLIES) ×1 IMPLANT
SET COLLECT BLD 21X3/4 12 (NEEDLE) IMPLANT
SPONGE GAUZE 4X4 12PLY (GAUZE/BANDAGES/DRESSINGS) ×4 IMPLANT
STAPLER VISISTAT 35W (STAPLE) IMPLANT
STOPCOCK 4 WAY LG BORE MALE ST (IV SETS) IMPLANT
STRIP CLOSURE SKIN 1/2X4 (GAUZE/BANDAGES/DRESSINGS) ×2 IMPLANT
SUT ETHILON 3 0 PS 1 (SUTURE) IMPLANT
SUT PROLENE 5 0 C 1 24 (SUTURE) ×2 IMPLANT
SUT PROLENE 6 0 CC (SUTURE) ×5 IMPLANT
SUT SILK 2 0 SH (SUTURE) ×2 IMPLANT
SUT VIC AB 2-0 CTX 36 (SUTURE) ×4 IMPLANT
SUT VIC AB 3-0 SH 27 (SUTURE) ×4
SUT VIC AB 3-0 SH 27X BRD (SUTURE) ×2 IMPLANT
TOWEL OR 17X24 6PK STRL BLUE (TOWEL DISPOSABLE) ×4 IMPLANT
TOWEL OR 17X26 10 PK STRL BLUE (TOWEL DISPOSABLE) ×4 IMPLANT
TRAY FOLEY CATH 16FRSI W/METER (SET/KITS/TRAYS/PACK) ×2 IMPLANT
TUBING EXTENTION W/L.L. (IV SETS) IMPLANT
UNDERPAD 30X30 INCONTINENT (UNDERPADS AND DIAPERS) ×2 IMPLANT
WATER STERILE IRR 1000ML POUR (IV SOLUTION) ×2 IMPLANT

## 2013-02-05 NOTE — Op Note (Signed)
OPERATIVE REPORT  DATE OF SURGERY: 02/05/2013  PATIENT: Brenda [name], 62 y.o. female MRN: [id number]  DOB: Feb 22, 1951  PRE-OPERATIVE DIAGNOSIS: Limiting claudication right leg  POST-OPERATIVE DIAGNOSIS:  Same  PROCEDURE: Right femoropopliteal revision with extension from prior above-knee popliteal anastomosis 2 below knee popliteal artery with reversed small saphenous vein  SURGEON:  Gretta Began, M.D.  PHYSICIAN ASSISTANT: Rhyne  ANESTHESIA:  Gen.  EBL:  50 50 ml  Total I/O In: 2500 [I.V.:2250; IV Piggyback:250] Out: 400 [Urine:350; Blood:50]  BLOOD ADMINISTERED: None  DRAINS: None  SPECIMEN: None  COUNTS CORRECT:  YES  PLAN OF CARE: PACU   PATIENT DISPOSITION:  PACU - hemodynamically stable  PROCEDURE DETAILS: The patient was taken to the operating room and placed supine position where the area the right groin right leg prepped in usual fashion. Ultrasound SonoSite was used to visualize and marked the level of the small saphenous vein. The saphenous small saphenous vein was harvested from just above the ankle to the popliteal space. Tributary branches were ligated with 304 0 silk ties and divided. The vein was gently dilated and was of moderate size but adequate for bypass. Next incision was made over the medial approach of the above-knee popliteal artery and the old vein graft from 1999 was exposed. The anastomosis with the above-knee popliteal artery was exposed. Next a separate incision was made at the medial aspect of the below-knee popliteal artery. The artery was extensively calcified. This was extended down onto the anterior tibial artery takeoff and the tibial peroneal trunk which was also heavily calcified. Dissection was continued down onto the tibioperoneal trunk towards the posterior tibial artery this was all circumferentially calcified. Subcutaneous and the option would be to anastomose to the popliteal artery at the anterior tibial artery takeoff. A shunt  anatomically behind the knee from the above-knee to below-knee popliteal artery. The patient was given 6000 intravenous heparin after adequate circulation time the prior vein graft was occluded at the popliteal artery just proximal to this. An incision was made over the hood of the old vein graft. The small saphenous vein was brought into the field was spatulated and sewn end-to-side to the old vein graft at the prior above-knee popliteal anastomosis. This was a running 6-0 Prolene suture. Anastomosis was tested and found to be adequate. The vein was brought to the below knee popliteal artery. Next a 24 cm tourniquet was placed in the above-knee position. The leg was elevated and exsanguinated with an Esmarch tourniquet. A pneumatic tourniquet was inflated to 8 mmHg. The popliteal artery was opened with an 11 blade and extended longitudinally with Potts scissors. There was extreme calcification. A 1-1/2 dilator passed down the anterior tibial and tibioperoneal trunk. There was a slight endarterectomy in this area just to allow anastomosis on the popliteal artery. The vein was cut to appropriate length and was spatulated and sewn end-to-side with the hood of the vein graft going on to the junction of the origin of the anterior tibial artery. Prior to completion of the closure the tourniquet was deflated colostomy was undertaken. Anastomosis was completed and the graft and flow was noted the anterior tibial artery ankle. The wounds were irrigated with saline. The patient was given 50 mg of protamine to reverse the heparin. The wounds were closed with 2-0 Vicryl the fascia in running fashion. The skin incisions were closed with 3-0 Vicryl in the subcutaneous and subcutaneous tissue. The vein harvest site of the cell small saphenous vein was closed 2 layers  of 3-0 Vicryl suture. Sterile dressing was applied and the patient taken to the recovery in stable condition   Gretta Began, M.D. 02/05/2013 2:29 PM

## 2013-02-05 NOTE — Preoperative (Signed)
Beta Blockers   Reason not to administer Beta Blockers:Not Applicable 

## 2013-02-05 NOTE — Anesthesia Preprocedure Evaluation (Addendum)
Anesthesia Evaluation  Patient identified by MRN, date of birth, ID band Patient awake    Reviewed: Allergy & Precautions, H&P , NPO status , Patient's Chart, lab work & pertinent test results  History of Anesthesia Complications Negative for: history of anesthetic complications  Airway Mallampati: I TM Distance: >3 FB Neck ROM: Full    Dental  (+) Edentulous Upper, Edentulous Lower and Dental Advisory Given   Pulmonary shortness of breath and with exertion, COPD COPD inhaler,    + decreased breath sounds      Cardiovascular hypertension, Pt. on medications + CAD, + Past MI, + Cardiac Stents and + Peripheral Vascular Disease Rhythm:Regular Rate:Normal     Neuro/Psych 2000. Left-sided weakness. TIACVA    GI/Hepatic Neg liver ROS, hiatal hernia, GERD-  Medicated and Controlled,  Endo/Other  diabetes, Type 2, Insulin Dependent  Renal/GU negative Renal ROS     Musculoskeletal   Abdominal   Peds  Hematology negative hematology ROS (+)   Anesthesia Other Findings   Reproductive/Obstetrics negative OB ROS                           Anesthesia Physical Anesthesia Plan  ASA: III  Anesthesia Plan: General   Post-op Pain Management:    Induction: Intravenous  Airway Management Planned: Oral ETT  Additional Equipment:   Intra-op Plan:   Post-operative Plan: Extubation in OR  Informed Consent: I have reviewed the patients History and Physical, chart, labs and discussed the procedure including the risks, benefits and alternatives for the proposed anesthesia with the patient or authorized representative who has indicated his/her understanding and acceptance.     Plan Discussed with: CRNA and Surgeon  Anesthesia Plan Comments:         Anesthesia Quick Evaluation

## 2013-02-05 NOTE — Anesthesia Postprocedure Evaluation (Signed)
Anesthesia Post Note  Patient: Brenda [name]  Procedure(s) Performed: Procedure(s) (LRB): BYPASS GRAFT ABOVE KNEE POPLITEAL TO BELOW KNEE POPLITEAL WITH SMALL SAPHANOUS VEIN (Right)  Anesthesia type: general  Patient location: PACU  Post pain: Pain level controlled  Post assessment: Patient's Cardiovascular Status Stable  Last Vitals:  Filed Vitals:   02/05/13 1210  BP: 135/55  Pulse: 56  Temp:   Resp: 12    Post vital signs: Reviewed and stable  Level of consciousness: sedated  Complications: No apparent anesthesia complications

## 2013-02-05 NOTE — Telephone Encounter (Signed)
Message copied by Margaretmary Eddy on Fri Feb 05, 2013  1:42 PM ------      Message from: Sharee Pimple      Created: Fri Feb 05, 2013 11:07 AM      Regarding: schedule                   ----- Message -----         From: Dara Lords, PA-C         Sent: 02/05/2013  10:53 AM           To: Sharee Pimple, CMA            S/p AK pop to BK pop with small saphenous vein right leg 02/05/13.            F/u with Dr. Arbie Cookey in 3 weeks.            Thanks,      Lelon Mast ------

## 2013-02-05 NOTE — Telephone Encounter (Signed)
LVM re appointment information, sent letter - kf °

## 2013-02-05 NOTE — Transfer of Care (Signed)
Immediate Anesthesia Transfer of Care Note  Patient: Brenda [name]  Procedure(s) Performed: Procedure(s): BYPASS GRAFT ABOVE KNEE POPLITEAL TO BELOW KNEE POPLITEAL WITH SMALL SAPHANOUS VEIN (Right)  Patient Location: PACU  Anesthesia Type:General  Level of Consciousness: awake, alert , oriented and patient cooperative  Airway & Oxygen Therapy: Patient Spontanous Breathing and Patient connected to face mask oxygen  Post-op Assessment: Report given to PACU RN and Post -op Vital signs reviewed and stable  Post vital signs: Reviewed and stable  Complications: No apparent anesthesia complications

## 2013-02-05 NOTE — Interval H&P Note (Signed)
History and Physical Interval Note:  02/05/2013 7:18 AM  Brenda [name]  has presented today for surgery, with the diagnosis of claudication  The various methods of treatment have been discussed with the patient and family. After consideration of risks, benefits and other options for treatment, the patient has consented to  Procedure(s): BYPASS GRAFT ABOVE KNEE POPLITEAL TO BELOW KNEE POPLITEAL WITH SMALL SAPHANOUS VEIN (Right) as a surgical intervention .  The patient's history has been reviewed, patient examined, no change in status, stable for surgery.  I have reviewed the patient's chart and labs.  Questions were answered to the patient's satisfaction.     [name]

## 2013-02-05 NOTE — H&P (View-Only) (Signed)
[facility] [place]   Patient name: Brenda [name] MRN: [id number] DOB: May 14, 1951 Sex: female   Referred by: Juleen China  Reason for referral:  Chief Complaint  Patient presents with  . New Evaluation    blockage in rt leg seen on angio by Dr. Allyson Sabal     HISTORY OF PRESENT ILLNESS: Patient is a 62 year old female well-known to myself from prior bilateral femoral-popliteal bypasses. She had a very advanced Cerissa Zeiger atherosclerotic changes. She underwent a right femoral to above-knee popliteal bypass by myself in January of 1999 and a left femoral to below-knee popliteal bypass in January of 2000. She's done well since this time. We have not seen her in quite a few years. She recently began having severely limiting claudication in her right leg and noninvasive studies suggested arterial insufficiency. She underwent a formal arteriogram on 11/26/2012 at [facility] hospital and I have reviewed these films. This shows a widely patent femoral popliteal bypasses bilaterally. She does have complete occlusion of her popliteal artery below the bypass on the right with reconstitution of the anterior tibial and tibioperoneal trunks. She denies any rest pain or tissue loss. She is severely limited by her calf claudication. She reports that she cannot walk from one end of her hot towels to another without having to stop and rest.  Past Medical History  Diagnosis Date  . History of DVT (deep vein thrombosis)   . Coronary artery disease     multiple stens  . COPD (chronic obstructive pulmonary disease)   . Transient ischemic attack   . Depression   . Hx of radiation therapy 01/16/09 - 03/06/09    base of tongue, right neck node  . Peripheral arterial disease     status post bilateral femoropopliteal bypass graft performed by Dr. Tawanna Cooler Satine Hausner in the past with significant right lower extremity lifestyle limiting claudication  . Pneumonia   . Hypercholesteremia   . Myocardial infarction 2005  .  Chronic bronchitis   . Shortness of breath     "w/exertion and sometimes when laying down" (11/26/2012)  . Type II diabetes mellitus   . History of blood transfusion 1955  . GERD (gastroesophageal reflux disease)   . H/O hiatal hernia   . Stroke 2000    left side weakness remains (11/26/2012)  . Arthritis     "hands" (11/26/2012)  . 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 Tee without cardioversion  07/26/2011    Procedure: TRANSESOPHAGEAL ECHOCARDIOGRAM (TEE);  Surgeon: Chrystie Nose, MD;  Location: [facility] ENDOSCOPY;  Service: Cardiovascular;  Laterality: N/A;  . Esophagogastroduodenoscopy  12/20/2011    Procedure: ESOPHAGOGASTRODUODENOSCOPY (EGD);  Surgeon: Theda Belfast, MD;  Location: [facility] ENDOSCOPY;  Service: Endoscopy;  Laterality: N/A;  EGD with balloon dilation  . Savory dilation  12/20/2011    Procedure: SAVORY DILATION;  Surgeon: Theda Belfast, MD;  Location: [facility] ENDOSCOPY;  Service: Endoscopy;  Laterality: N/A;  . Tonsillectomy  1969  . Appendectomy  1962  . Cardiac catheterization  1999; 11/26/2012  . Coronary angioplasty with stent placement  1999--2005    "i think I have 3-5 total" (11/26/2012)  . Femoral bypass  1999; 2000    [name] 11/26/2012  . Carpal tunnel release Right 1980's?  . Vaginal hysterectomy  1988  . Cesarean section  1978  . Tubal ligation  1988  . Cataract extraction w/  intraocular lens implant Right 2011  . Ankle fracture surgery Right 2007    "got a metal plate and 8 screws in it" (11/26/2012)    History   Social History  . Marital Status: Married    Spouse Name: N/A    Number of Children: N/A  . Years of Education: N/A   Occupational History  . Not on file.   Social History Main Topics  . Smoking status: Current Every Day Smoker -- 1.00 packs/day for 50 years    Types: Cigarettes  . Smokeless tobacco: Never Used  . Alcohol Use: 6.0 oz/week    10  Glasses of wine per week  . Drug Use: No  . Sexual Activity: Yes   Other Topics Concern  . Not on file   Social History Narrative  . No narrative on file    Family History  Problem Relation Age of Onset  . Cancer Mother     pancreatic  . Diabetes Mother   . Hyperlipidemia Mother   . Hypertension Mother   . Other Mother     varicose vein  . Cancer Father 72    throat  . Heart disease Father   . Heart attack Father   . Cancer Sister     breast  . Diabetes Sister   . Deep vein thrombosis Brother   . Diabetes Brother   . Hearing loss Brother   . Hyperlipidemia Brother   . Hypertension Brother   . Heart attack Brother   . Clotting disorder Brother   . Diabetes Son   . Hyperlipidemia Son     Allergies as of 01/26/2013 - Review Complete 01/26/2013  Allergen Reaction Noted  . Tape Hives 05/27/2011  . Nicoderm [nicotine] Rash 01/03/2012    Current Outpatient Prescriptions on File Prior to Visit  Medication Sig Dispense Refill  . acetaminophen (TYLENOL) 500 MG tablet Take 1,000 mg by mouth every 6 (six) hours as needed for pain.      Marland Kitchen aspirin EC 81 MG tablet Take 81 mg by mouth daily.      Marland Kitchen buPROPion (WELLBUTRIN XL) 150 MG 24 hr tablet Take 150 mg by mouth daily.      . clopidogrel (PLAVIX) 75 MG tablet Take 75 mg by mouth daily.       Marland Kitchen docusate sodium (COLACE) 100 MG capsule Take 100 mg by mouth daily.      . Fiber CAPS Take 2 capsules by mouth 2 (two) times daily.      . fish oil-omega-3 fatty acids 1000 MG capsule Take 1 g by mouth 2 (two) times daily.       . insulin glargine (LANTUS) 100 UNIT/ML injection Inject 16-20 Units into the skin 2 (two) times daily. 16-20 units twice a day      . insulin glulisine (APIDRA) 100 UNIT/ML injection Inject 4-8 Units into the skin 4 (four) times daily as needed for high blood sugar. For blood sugar levels      . Multiple Vitamin (MULTIVITAMIN) tablet Take 1 tablet by mouth daily.      . nitroGLYCERIN (NITROLINGUAL) 0.4  MG/SPRAY spray Place 1 spray under the tongue every 5 (five) minutes as needed for chest pain.      Marland Kitchen omeprazole (PRILOSEC) 20 MG capsule Take 20 mg by mouth daily.       . pilocarpine (SALAGEN) 5 MG tablet Take 5 mg by mouth 3 (three) times daily.      . rosuvastatin (CRESTOR) 20 MG tablet  Take 20 mg by mouth daily.      Marland Kitchen tiotropium (SPIRIVA) 18 MCG inhalation capsule Place 18 mcg into inhaler and inhale daily.       No current facility-administered medications on file prior to visit.     REVIEW OF SYSTEMS:  Positives indicated with an "X"  CARDIOVASCULAR:  [ ]  chest pain   [ ]  chest pressure   [ ]  palpitations   [ ]  orthopnea   [ ]  dyspnea on exertion   [x ] claudication   [ ]  rest pain   [ ]  DVT   [ ]  phlebitis PULMONARY:   [ ]  productive cough   [ ]  asthma   [ ]  wheezing NEUROLOGIC:   [ ]  weakness  [ ]  paresthesias  [ ]  aphasia  [ ]  amaurosis  [ ]  dizziness HEMATOLOGIC:   [ ]  bleeding problems   [ ]  clotting disorders MUSCULOSKELETAL:  [ ]  joint pain   [ ]  joint swelling GASTROINTESTINAL: [ ]   blood in stool  [ ]   hematemesis GENITOURINARY:  [ ]   dysuria  [ ]   hematuria PSYCHIATRIC:  [ ]  history of major depression INTEGUMENTARY:  [ ]  rashes  [ ]  ulcers CONSTITUTIONAL:  [ ]  fever   [ ]  chills  PHYSICAL EXAMINATION:  General: The patient is a well-nourished female, in no acute distress. Vital signs are BP 137/71  Pulse 87  Ht 5\' 6"  (1.676 m)  Wt 136 lb (61.689 kg)  BMI 21.96 kg/m2  SpO2 98% Pulmonary: There is a good air exchange bilaterally without wheezing or rales. Abdomen: Soft and non-tender with normal pitch bowel sounds. Musculoskeletal: There are no major deformities.  There is no significant extremity pain. Neurologic: No focal weakness or paresthesias are detected, Skin: There are no ulcer or rashes noted. Psychiatric: The patient has normal affect. Cardiovascular: There is a regular rate and rhythm without significant murmur appreciated. Pulse status: 2+  radial 2+ femoral 2+ left popliteal and 2+ left dorsalis pedis pulse. Absent pedal pulses on the right     Impression and Plan:  Severe limiting claudication right leg with occlusion of her popliteal artery below the patent right femoral to above-knee popliteal bypass. I did reimage her veins with sinusitis she does have acceptable caliber small saphenous vein for bypass. I've recommended bypass from her existing above-knee popliteal to or below knee popliteal artery. I feel that she would be very high risk for failure with endovascular treatment since this is behind her knee. She understands and we'll proceed as soon as she has cardiac clearance from Dr. [name]    Guillermo Difrancesco [facility] [place] Office: [phone]

## 2013-02-05 NOTE — Progress Notes (Signed)
Utilization review completed.  

## 2013-02-05 NOTE — Anesthesia Procedure Notes (Signed)
Procedure Name: Intubation Date/Time: 02/05/2013 7:38 AM Performed by: Lovie Chol Pre-anesthesia Checklist: Patient identified, Emergency Drugs available, Suction available, Patient being monitored and Timeout performed Patient Re-evaluated:Patient Re-evaluated prior to inductionOxygen Delivery Method: Circle system utilized Preoxygenation: Pre-oxygenation with 100% oxygen Intubation Type: IV induction Ventilation: Mask ventilation without difficulty Laryngoscope Size: Miller and 2 Grade View: Grade I Tube type: Oral Tube size: 7.5 mm Number of attempts: 1 Airway Equipment and Method: Stylet Placement Confirmation: ETT inserted through vocal cords under direct vision,  positive ETCO2,  CO2 detector and breath sounds checked- equal and bilateral Secured at: 21 cm Tube secured with: Tape Dental Injury: Teeth and Oropharynx as per pre-operative assessment

## 2013-02-06 DIAGNOSIS — Z48812 Encounter for surgical aftercare following surgery on the circulatory system: Secondary | ICD-10-CM

## 2013-02-06 LAB — GLUCOSE, CAPILLARY
Glucose-Capillary: 109 mg/dL — ABNORMAL HIGH (ref 70–99)
Glucose-Capillary: 128 mg/dL — ABNORMAL HIGH (ref 70–99)
Glucose-Capillary: 162 mg/dL — ABNORMAL HIGH (ref 70–99)

## 2013-02-06 LAB — BASIC METABOLIC PANEL
BUN: 10 mg/dL (ref 6–23)
CO2: 27 mEq/L (ref 19–32)
GFR calc non Af Amer: >90 mL/min (ref >90)
Glucose, Bld: 128 mg/dL — ABNORMAL HIGH (ref 70–99)
Potassium: 4 mEq/L (ref 3.5–5.1)

## 2013-02-06 LAB — CBC
HCT: 39.3 % (ref 36.0–46.0)
Hemoglobin: 13.5 g/dL (ref 12.0–15.0)
MCH: 31.8 pg (ref 26.0–34.0)
MCHC: 34.4 g/dL (ref 30.0–36.0)
RBC: 4.24 MIL/uL (ref 3.87–5.11)

## 2013-02-06 LAB — HEMOGLOBIN A1C: Mean Plasma Glucose: 200 mg/dL — ABNORMAL HIGH (ref <117)

## 2013-02-06 NOTE — Progress Notes (Signed)
Physical Therapy Evaluation Patient Details Name: Brenda [name] MRN: [id number] DOB: [birth year] Today's Date: 02/06/2013 Time: 4098-1191 PT Time Calculation (min): 26 min  PT Assessment / Plan / Recommendation History of Present Illness    Pt admit for Right femoropopliteal revision with extension from prior above-knee popliteal anastomosis 2 below knee popliteal artery with reversed small saphenous vein.    Clinical Impression  Pt admitted with above surgery. Pt currently with functional limitations due to the deficits listed below (see PT Problem List). Pt should progress and be able to go home with HHPT f/u. Will need  A RW.   Pt will benefit from skilled PT to increase their independence and safety with mobility to allow discharge to the venue listed below.     PT Assessment  Patient needs continued PT services    Follow Up Recommendations  Home health PT;Supervision/Assistance - 24 hour                Equipment Recommendations  Rolling walker with 5" wheels         Frequency Min 3X/week    Precautions / Restrictions Precautions Precautions: Fall Restrictions Weight Bearing Restrictions: No   Pertinent Vitals/Pain VSS, 10/10 pain with nursing bringing meds.      Mobility  Bed Mobility Bed Mobility: Rolling Right;Right Sidelying to Sit;Sitting - Scoot to Edge of Bed Rolling Right: 4: Min assist Right Sidelying to Sit: 5: Supervision Sitting - Scoot to Edge of Bed: 5: Supervision Details for Bed Mobility Assistance: min assist to initiate right LE movement.  Took incr time due to patient soreness in right LE.  Pt crying due to pain.  Called nursing who brought meds.   Transfers Transfers: Sit to Stand;Stand to [employer] Transfers Sit to Stand: 4: Min assist;With upper extremity assist;From bed Stand to Sit: 4: Min assist;With upper extremity assist;To chair/3-in-1;With armrests Stand Pivot Transfers: 4: Min assist Details for Transfer Assistance: Pt  transferred to 3n1 with RW with incr pain per pt.  Fairly good safety with use of RW for transfer.   Ambulation/Gait Ambulation/Gait Assistance: 4: Min assist Ambulation Distance (Feet): 25 Feet Assistive device: Rolling walker Ambulation/Gait Assistance Details: Pt ambulated with RW out into hallway with min assist and cues for postural stability and sequencing steps and RW.  Pt needed to have chair brought to her as her pain in left LE became worse. Pt could not put heel down with ambulation on right LE. Gait Pattern: Step-to pattern;Decreased stride length;Decreased step length - right;Decreased weight shift to right;Antalgic Stairs: No Wheelchair Mobility Wheelchair Mobility: No    Exercises Other Exercises Other Exercises: Demonstrated heel cord stretch with sheet for pt and instructed pt to perform multiple x per day   PT Diagnosis: Generalized weakness;Acute pain  PT Problem List: Decreased strength;Decreased activity tolerance;Decreased balance;Decreased mobility;Decreased knowledge of use of DME;Decreased safety awareness;Decreased knowledge of precautions;Pain PT Treatment Interventions: DME instruction;Gait training;Stair training;Functional mobility training;Therapeutic activities;Therapeutic exercise;Balance training;Patient/family education     PT Goals(Current goals can be found in the care plan section) Acute Rehab PT Goals Patient Stated Goal: to go home PT Goal Formulation: With patient Time For Goal Achievement: 02/13/13 Potential to Achieve Goals: Good  Visit Information  Last PT Received On: 02/06/13 Assistance Needed: +2       Prior Functioning  Home Living Family/patient expects to be discharged to:: Private residence Living Arrangements: Spouse/significant other Available Help at Discharge: Family;Available PRN/intermittently (husband works MWF 7am-5pm, daughter works but helps some) Type of Home:  House Home Access: Stairs to enter Secretary/administrator  of Steps: 2 Entrance Stairs-Rails: Right Home Layout: Two level Alternate Level Stairs-Number of Steps: 13 Alternate Level Stairs-Rails: Left Home Equipment: Bedside commode (has walker but not sure if it has wheels) Additional Comments: 24 hour care for first week by daughter Prior Function Level of Independence: Independent Communication Communication: No difficulties    Cognition  Cognition Arousal/Alertness: Awake/alert Behavior During Therapy: WFL for tasks assessed/performed Overall Cognitive Status: Within Functional Limits for tasks assessed    Extremity/Trunk Assessment Upper Extremity Assessment Upper Extremity Assessment: Defer to OT evaluation Lower Extremity Assessment Lower Extremity Assessment: RLE deficits/detail RLE Deficits / Details: grossly 3-/5 RLE: Unable to fully assess due to pain Cervical / Trunk Assessment Cervical / Trunk Assessment: Normal   Balance Balance Balance Assessed: Yes Static Standing Balance Static Standing - Balance Support: Bilateral upper extremity supported;During functional activity Static Standing - Level of Assistance: 4: Min assist Static Standing - Comment/# of Minutes: 3  End of Session PT - End of Session Equipment Utilized During Treatment: Gait belt Activity Tolerance: Patient limited by pain Patient left: in chair;with call bell/phone within reach Nurse Communication: Patient requests pain meds;Mobility status       INGOLD,Lyne Khurana 02/06/2013, 12:55 PM [facility] [phone] (pager)

## 2013-02-06 NOTE — Progress Notes (Signed)
VASCULAR LAB PRELIMINARY  ARTERIAL  ABI completed:    RIGHT    LEFT    PRESSURE WAVEFORM  PRESSURE WAVEFORM  BRACHIAL 127 Triphasic BRACHIAL 142 Triphasic  DP 59 Biphasic DP 140 Triphasic  AT   AT    PT 55 Monophasic PT 147 Triphasic  PER   PER    GREAT TOE  NA GREAT TOE  NA    RIGHT LEFT  ABI 0.42 1.04    The right ABI is suggestive of severe arterial insufficiency. The left ABI is within normal limits.  02/06/2013 10:51 AM Gertie Fey, RVT, RDCS, RDMS

## 2013-02-06 NOTE — Progress Notes (Signed)
Pt c/o "not beign able to swallow, food won't go past my throat and it comes back up, started this morning when I started getting solid foods."  Pt requests to have pureed, mechanical soft diet for now to be able to eat.  Will change diet order to mechanical soft diet.  Maybe swallow eval would benefit?  Ave Filter

## 2013-02-06 NOTE — Progress Notes (Signed)
Patient being transferred to [facility] per MD order. Report called to [place], Charity fundraiser. Patient will transfer in wheel chair. All vital signs WNL, Patient alert and oriented.

## 2013-02-06 NOTE — Progress Notes (Addendum)
[facility] Progress Note  02/06/2013 7:28 AM 1 Day Post-Op  Subjective:  "My leg is sore"  Afebrile 90's-140's systolic HR 50's-80's regular 47% O2NC  Filed Vitals:   02/06/13 0400  BP: 99/53  Pulse: 81  Temp: 98.5 F (36.9 C)  Resp: 15    Physical Exam: Incisions:  Posterior RLE incision is c/d/i.  Right AK incision with minimal bloody drainage.  Right BK incision is c/d/i Extremities:  Right foot with +DP/PT/peroneal signal  CBC    Component Value Date/Time   WBC 4.6 02/06/2013 0602   WBC 5.1 08/21/2012 1256   RBC 4.24 02/06/2013 0602   RBC 4.85 08/21/2012 1256   HGB 13.5 02/06/2013 0602   HGB 15.6 08/21/2012 1256   HCT 39.3 02/06/2013 0602   HCT 44.6 08/21/2012 1256   PLT 133* 02/06/2013 0602   PLT 192 08/21/2012 1256   MCV 92.7 02/06/2013 0602   MCV 92.0 08/21/2012 1256   MCH 31.8 02/06/2013 0602   MCH 32.2 08/21/2012 1256   MCHC 34.4 02/06/2013 0602   MCHC 34.9 08/21/2012 1256   RDW 13.4 02/06/2013 0602   RDW 13.8 08/21/2012 1256   LYMPHSABS 0.6* 08/21/2012 1256   LYMPHSABS 0.8 07/23/2011 1905   MONOABS 0.3 08/21/2012 1256   MONOABS 0.3 07/23/2011 1905   EOSABS 0.0 08/21/2012 1256   EOSABS 0.1 07/23/2011 1905   BASOSABS 0.1 08/21/2012 1256   BASOSABS 0.1 07/23/2011 1905    BMET    Component Value Date/Time   NA 136 02/06/2013 0602   NA 136 08/21/2012 1256   NA 139 08/20/2011 1202   K 4.0 02/06/2013 0602   K 4.1 08/21/2012 1256   K 4.1 08/20/2011 1202   CL 100 02/06/2013 0602   CL 100 08/21/2012 1256   CL 90* 08/20/2011 1202   CO2 27 02/06/2013 0602   CO2 28 08/21/2012 1256   CO2 31 08/20/2011 1202   GLUCOSE 128* 02/06/2013 0602   GLUCOSE 315* 08/21/2012 1256   GLUCOSE 261* 08/20/2011 1202   BUN 10 02/06/2013 0602   BUN 14.6 08/21/2012 1256   BUN 11 08/20/2011 1202   CREATININE 0.67 02/06/2013 0602   CREATININE 0.74 11/20/2012 1019   CREATININE 1.3* 08/21/2012 1256   CALCIUM 8.5 02/06/2013 0602   CALCIUM 9.1 08/21/2012 1256   CALCIUM 9.1 08/20/2011 1202   GFRNONAA >90 02/06/2013  0602   GFRAA >90 02/06/2013 0602    INR    Component Value Date/Time   INR 0.87 02/04/2013 1324     Intake/Output Summary (Last 24 hours) at 02/06/13 8295 Last data filed at 02/06/13 0500  Gross per 24 hour  Intake   2500 ml  Output   2000 ml  Net    500 ml     Assessment/Plan:  62 y.o. 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 -increase mobilization today; PT/OT -pt with 4x4's and kerlix for dressing due to tape allergy -DVT prophylaxis:  Lovenox to begin today -transfer to 2W   Doreatha Massed, PA-C [facility] [phone] 02/06/2013 7:28 AM  Addendum  I have independently interviewed and examined the patient, and I agree with the physician assistant's findings.  R leg bandaged.  Dopplerable DP>PT.  Dsg off tomorrow.  Tsfr to flr  Leonides Sake, MD [facility] [place] Office: [phone] Pager: [phone]  02/06/2013, 8:14 AM

## 2013-02-07 LAB — GLUCOSE, CAPILLARY
Glucose-Capillary: 139 mg/dL — ABNORMAL HIGH (ref 70–99)
Glucose-Capillary: 219 mg/dL — ABNORMAL HIGH (ref 70–99)

## 2013-02-07 MED ORDER — TRIAMCINOLONE ACETONIDE 0.5 % EX OINT
TOPICAL_OINTMENT | Freq: Two times a day (BID) | CUTANEOUS | Status: DC
  Filled 2013-02-07: qty 15

## 2013-02-07 MED ORDER — CHLORHEXIDINE GLUCONATE CLOTH 2 % EX PADS
6.0000 | MEDICATED_PAD | Freq: Every day | CUTANEOUS | Status: DC
  Administered 2013-02-08 – 2013-02-09 (×2): 6 via TOPICAL

## 2013-02-07 MED ORDER — MUPIROCIN 2 % EX OINT
1.0000 "application " | TOPICAL_OINTMENT | Freq: Two times a day (BID) | CUTANEOUS | Status: DC
  Administered 2013-02-07 – 2013-02-09 (×3): 1 via NASAL
  Filled 2013-02-07: qty 22

## 2013-02-07 NOTE — Progress Notes (Addendum)
[facility] Progress Note  02/07/2013 7:53 AM 2 Days Post-Op  Subjective:  "I'm having trouble swallowing-my food won't go down"  Tm 100.3 VSS 98% 2LO2NC  Filed Vitals:   02/07/13 0447  BP: 125/67  Pulse: 73  Temp: 100.3 F (37.9 C)  Resp: 19    Physical Exam: Incisions:  C/d/i without drainage Extremities:  Right foot warm, pink.  Pulses are not palpable.  CBC    Component Value Date/Time   WBC 4.6 02/06/2013 0602   WBC 5.1 08/21/2012 1256   RBC 4.24 02/06/2013 0602   RBC 4.85 08/21/2012 1256   HGB 13.5 02/06/2013 0602   HGB 15.6 08/21/2012 1256   HCT 39.3 02/06/2013 0602   HCT 44.6 08/21/2012 1256   PLT 133* 02/06/2013 0602   PLT 192 08/21/2012 1256   MCV 92.7 02/06/2013 0602   MCV 92.0 08/21/2012 1256   MCH 31.8 02/06/2013 0602   MCH 32.2 08/21/2012 1256   MCHC 34.4 02/06/2013 0602   MCHC 34.9 08/21/2012 1256   RDW 13.4 02/06/2013 0602   RDW 13.8 08/21/2012 1256   LYMPHSABS 0.6* 08/21/2012 1256   LYMPHSABS 0.8 07/23/2011 1905   MONOABS 0.3 08/21/2012 1256   MONOABS 0.3 07/23/2011 1905   EOSABS 0.0 08/21/2012 1256   EOSABS 0.1 07/23/2011 1905   BASOSABS 0.1 08/21/2012 1256   BASOSABS 0.1 07/23/2011 1905    BMET    Component Value Date/Time   NA 136 02/06/2013 0602   NA 136 08/21/2012 1256   NA 139 08/20/2011 1202   K 4.0 02/06/2013 0602   K 4.1 08/21/2012 1256   K 4.1 08/20/2011 1202   CL 100 02/06/2013 0602   CL 100 08/21/2012 1256   CL 90* 08/20/2011 1202   CO2 27 02/06/2013 0602   CO2 28 08/21/2012 1256   CO2 31 08/20/2011 1202   GLUCOSE 128* 02/06/2013 0602   GLUCOSE 315* 08/21/2012 1256   GLUCOSE 261* 08/20/2011 1202   BUN 10 02/06/2013 0602   BUN 14.6 08/21/2012 1256   BUN 11 08/20/2011 1202   CREATININE 0.67 02/06/2013 0602   CREATININE 0.74 11/20/2012 1019   CREATININE 1.3* 08/21/2012 1256   CALCIUM 8.5 02/06/2013 0602   CALCIUM 9.1 08/21/2012 1256   CALCIUM 9.1 08/20/2011 1202   GFRNONAA >90 02/06/2013 0602   GFRAA >90 02/06/2013 0602    INR    Component Value Date/Time    INR 0.87 02/04/2013 1324     Intake/Output Summary (Last 24 hours) at 02/07/13 0753 Last data filed at 02/07/13 0242  Gross per 24 hour  Intake    360 ml  Output   1600 ml  Net  -1240 ml     Assessment/Plan:  62 y.o. 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  Will get swallow evaluation -DVT prophylaxis:  Lovenox -continue increasing mobilization and OOB to chair tid. -case management for HHPT   Doreatha Massed, PA-C [facility] [phone] 02/07/2013 7:53 AM  Addendum  I have independently interviewed and examined the patient, and I agree with the physician assistant's findings.  SLP eval obtain.  Neuro exam intact, so suspect possible baseline issues presenting itself.  Right leg inc c/d/i, warm foot.  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   Pt sx is improved from  preop.  D/C home in the next 1-2 days depending on swallow issues.  Leonides Sake, MD [facility] [place] Office: [phone] Pager: [phone]  02/07/2013, 8:27 AM

## 2013-02-07 NOTE — Progress Notes (Signed)
Attempted to ambulate pt in room and pt is unable to put weight on right leg due to pain and stiffness.  Gave pain medication, explained purpose in being out of pain so can ambulate instead of being still so won't be in pain.  Pt verbalizes understanding.

## 2013-02-08 ENCOUNTER — Encounter (HOSPITAL_COMMUNITY): Payer: Self-pay | Admitting: Vascular Surgery

## 2013-02-08 LAB — GLUCOSE, CAPILLARY
Glucose-Capillary: 195 mg/dL — ABNORMAL HIGH (ref 70–99)
Glucose-Capillary: 169 mg/dL — ABNORMAL HIGH (ref 70–99)

## 2013-02-08 NOTE — Progress Notes (Addendum)
[facility] Progress Note  02/08/2013 7:24 AM 3 Days Post-Op  Subjective:  "still can't walk yet".  Pt states that they have not done the swallow evaluation.  She states that she had trouble swallowing prior to admission and sees Dr. Elnoria Howard.  She had esophageal dilatation ~ 1 year ago.  Tm 99.1 now 98.9 HR 60-80 100-140 systolic 91% RA  Filed Vitals:   02/08/13 0502  BP: 109/68  Pulse: 66  Temp: 98.9 F (37.2 C)  Resp: 21    Physical Exam: Incisions:  All are c/d/i without drainage Extremities:  + doppler signal right PT/DP/peroneal; right foot is warm.  CBC    Component Value Date/Time   WBC 4.6 02/06/2013 0602   WBC 5.1 08/21/2012 1256   RBC 4.24 02/06/2013 0602   RBC 4.85 08/21/2012 1256   HGB 13.5 02/06/2013 0602   HGB 15.6 08/21/2012 1256   HCT 39.3 02/06/2013 0602   HCT 44.6 08/21/2012 1256   PLT 133* 02/06/2013 0602   PLT 192 08/21/2012 1256   MCV 92.7 02/06/2013 0602   MCV 92.0 08/21/2012 1256   MCH 31.8 02/06/2013 0602   MCH 32.2 08/21/2012 1256   MCHC 34.4 02/06/2013 0602   MCHC 34.9 08/21/2012 1256   RDW 13.4 02/06/2013 0602   RDW 13.8 08/21/2012 1256   LYMPHSABS 0.6* 08/21/2012 1256   LYMPHSABS 0.8 07/23/2011 1905   MONOABS 0.3 08/21/2012 1256   MONOABS 0.3 07/23/2011 1905   EOSABS 0.0 08/21/2012 1256   EOSABS 0.1 07/23/2011 1905   BASOSABS 0.1 08/21/2012 1256   BASOSABS 0.1 07/23/2011 1905    BMET    Component Value Date/Time   NA 136 02/06/2013 0602   NA 136 08/21/2012 1256   NA 139 08/20/2011 1202   K 4.0 02/06/2013 0602   K 4.1 08/21/2012 1256   K 4.1 08/20/2011 1202   CL 100 02/06/2013 0602   CL 100 08/21/2012 1256   CL 90* 08/20/2011 1202   CO2 27 02/06/2013 0602   CO2 28 08/21/2012 1256   CO2 31 08/20/2011 1202   GLUCOSE 128* 02/06/2013 0602   GLUCOSE 315* 08/21/2012 1256   GLUCOSE 261* 08/20/2011 1202   BUN 10 02/06/2013 0602   BUN 14.6 08/21/2012 1256   BUN 11 08/20/2011 1202   CREATININE 0.67 02/06/2013 0602   CREATININE 0.74 11/20/2012 1019   CREATININE 1.3*  08/21/2012 1256   CALCIUM 8.5 02/06/2013 0602   CALCIUM 9.1 08/21/2012 1256   CALCIUM 9.1 08/20/2011 1202   GFRNONAA >90 02/06/2013 0602   GFRAA >90 02/06/2013 0602    INR    Component Value Date/Time   INR 0.87 02/04/2013 1324     Intake/Output Summary (Last 24 hours) at 02/08/13 0724 Last data filed at 02/07/13 2200  Gross per 24 hour  Intake    240 ml  Output   2100 ml  Net  -1860 ml     Assessment/Plan:  62 y.o. female is s/p:  Right femoropopliteal revision with extension from prior above-knee popliteal anastomosis 2 below knee popliteal artery with reversed small saphenous vein  3 Days Post-Op  -for swallow eval today-pt had trouble swallowing pre op.  Explained to pt she needs to f/u with Dr. Elnoria Howard in the next week or two.  She expresses understanding -continue increased mobilization with PT -case management has been ordered for HHPT -OOB to chair tid -DVT prophylaxis:  Lovenox   Doreatha Massed, PA-C [facility] [phone] 02/08/2013 7:24 AM  Addendum  I have independently interviewed and examined the patient, and I agree with the physician assistant's findings.  Somewhat slow recovering but incisions all intact with pain control acceptable.  D/C home over the next few days.  Leonides Sake, MD [facility] [place] Office: [phone] Pager: [phone]

## 2013-02-08 NOTE — Progress Notes (Signed)
Occupational Therapy Evaluation Patient Details Name: Brenda [name] MRN: [id number] DOB: April 11, 1951 Today's Date: 02/08/2013 Time: 1478-2956 OT Time Calculation (min): 14 min  OT Assessment / Plan / Recommendation History of present illness Pt. is s/p R fem pop revision   Clinical Impression   Patient presents to OT with decreased ADL independence and safety due to decreased balance, increased pain R foot. Will benefit from OT to maximize independence prior to discharge home.    OT Assessment  Patient needs continued OT Services    Follow Up Recommendations  No OT follow up;Supervision/Assistance - 24 hour    Barriers to Discharge      Equipment Recommendations  None recommended by OT (has BSC and shower chair at home per pt)    Recommendations for Other Services    Frequency  Min 2X/week    Precautions / Restrictions Precautions Precautions: Fall Restrictions Weight Bearing Restrictions: No   Pertinent Vitals/Pain 5/10 pain RLE    ADL  Eating/Feeding: Performed;Independent Where Assessed - Eating/Feeding: Edge of bed Grooming: Performed;Wash/dry hands;Wash/dry face;Teeth care;Min guard Where Assessed - Grooming: Supported standing Upper Body Bathing: Simulated;Set up Where Assessed - Upper Body Bathing: Unsupported sitting Lower Body Bathing: Simulated;Min guard Where Assessed - Lower Body Bathing: Supported sit to stand Upper Body Dressing: Simulated;Set up Where Assessed - Upper Body Dressing: Unsupported sitting Lower Body Dressing: Performed;Min guard Where Assessed - Lower Body Dressing: Supported sit to stand Toilet Transfer: Hydrographic surveyor Method: Sit to Barista: Materials engineer and Hygiene: Performed;Min guard Where Assessed - Engineer, mining and Hygiene: Sit to stand from 3-in-1 or toilet Transfers/Ambulation Related to ADLs: C/o 5/10 R foot limiting mobility. ADL  Comments: Patient did well standing at sink for grooming tasks.    OT Diagnosis: Generalized weakness;Acute pain  OT Problem List: Decreased strength;Decreased activity tolerance;Pain OT Treatment Interventions: Self-care/ADL training;DME and/or AE instruction;Therapeutic activities;Patient/family education   OT Goals(Current goals can be found in the care plan section) Acute Rehab OT Goals Patient Stated Goal: to go home OT Goal Formulation: With patient Time For Goal Achievement: 02/22/13 Potential to Achieve Goals: Good  Visit Information  Last OT Received On: 02/08/13 Assistance Needed: +1 History of Present Illness: Pt. is s/p R fem pop revision       Prior Functioning     Home Living Family/patient expects to be discharged to:: Private residence Living Arrangements: Spouse/significant other Available Help at Discharge: Family;Available PRN/intermittently Type of Home: House Home Access: Stairs to enter [employer] of Steps: 2 Entrance Stairs-Rails: Right Home Layout: Two level Alternate Level Stairs-Number of Steps: 13 Alternate Level Stairs-Rails: Left Home Equipment: Bedside commode;Shower seat Additional Comments: 24 hour care for first week by daughter Prior Function Level of Independence: Independent Communication Communication: No difficulties         Vision/Perception Vision - History Patient Visual Report: No change from baseline   Cognition  Cognition Arousal/Alertness: Awake/alert Behavior During Therapy: WFL for tasks assessed/performed Overall Cognitive Status: Within Functional Limits for tasks assessed    Extremity/Trunk Assessment Upper Extremity Assessment Upper Extremity Assessment: Overall WFL for tasks assessed Lower Extremity Assessment Lower Extremity Assessment: Defer to PT evaluation     End of Session OT - End of Session Equipment Utilized During Treatment: Gait belt;Rolling walker Activity Tolerance: Patient  tolerated treatment well Patient left: in bed;with call bell/phone within reach Nurse Communication: Patient requests pain meds  GO     Tineshia Becraft A 02/08/2013, 11:24  AM

## 2013-02-08 NOTE — Evaluation (Signed)
Clinical/Bedside Swallow Evaluation Patient Details  Name: Brenda [name] MRN: [id number] Date of Birth: Oct 28, 1950  Today's Date: 02/08/2013 Time: 0865-7846 SLP Time Calculation (min): 17 min  Past Medical History:  Past Medical History  Diagnosis Date  . History of DVT (deep vein thrombosis)   . Coronary artery disease     multiple stens  . COPD (chronic obstructive pulmonary disease)   . Transient ischemic attack   . Depression   . Hx of radiation therapy 01/16/09 - 03/06/09    base of tongue, right neck node  . Peripheral arterial disease     status post bilateral femoropopliteal bypass graft performed by Dr. Tawanna Cooler early in the past with significant right lower extremity lifestyle limiting claudication  . Pneumonia   . Hypercholesteremia   . Chronic bronchitis   . Shortness of breath     "w/exertion and sometimes when laying down" (11/26/2012)  . Type II diabetes mellitus   . History of blood transfusion 1955  . GERD (gastroesophageal reflux disease)   . H/O hiatal hernia   . Stroke 2000    left side weakness remains (11/26/2012)  . Arthritis     "hands" (11/26/2012)  . 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 Myocardial infarction 2005    dr [name]  with [facility]   Past Surgical History:  Past Surgical History  Procedure Laterality Date  . Tee without cardioversion  07/26/2011    Procedure: TRANSESOPHAGEAL ECHOCARDIOGRAM (TEE);  Surgeon: Chrystie Nose, MD;  Location: [facility] ENDOSCOPY;  Service: Cardiovascular;  Laterality: N/A;  . Esophagogastroduodenoscopy  12/20/2011    Procedure: ESOPHAGOGASTRODUODENOSCOPY (EGD);  Surgeon: Theda Belfast, MD;  Location: [facility] ENDOSCOPY;  Service: Endoscopy;  Laterality: N/A;  EGD with balloon dilation  . Savory dilation  12/20/2011    Procedure: SAVORY DILATION;  Surgeon: Theda Belfast, MD;  Location: [facility] ENDOSCOPY;  Service: Endoscopy;  Laterality: N/A;  . Tonsillectomy  1969  . Appendectomy  1962  .  Cardiac catheterization  1999; 11/26/2012  . Coronary angioplasty with stent placement  1999--2005    "i think I have 3-5 total" (11/26/2012)  . Femoral bypass  1999; 2000    [name] 11/26/2012  . Carpal tunnel release Right 1980's?  . Vaginal hysterectomy  1988  . Cesarean section  1978  . Tubal ligation  1988  . Cataract extraction w/ intraocular lens implant Right 2011  . Ankle fracture surgery Right 2007    "got a metal plate and 8 screws in it" (11/26/2012)   HPI:  62 year old female with PMH of COPD, GERD, hiatal hernia, PNA, TIA, radiation treatment to baseof tongue for right neck node (base of tongue cancer), G-tube placement 8/4 while undergoing chemo and radiation treatment, CVA with residual left sided weakness admitted for right femoropopliteal revision with extension from prior above-knee popliteal anastomosis 2 below knee popliteal artery with reversed small saphenous vein. C/o increased difficulty swallowing post-op although also reported dysphagia prior to admission. Currently under the care of Dr. Elnoria Howard. S/p esophageal dilation 1 year ago.    Assessment / Plan / Recommendation Clinical Impression  Patient presents with a suspected primary esophageal dysphagia given h/o esophageal narrowing s/p dilation, GERD, and c/o globus at bedside given what appears to be a functional oropharyngeal swallow without overt indication of aspiration. Education complete with patient regarding general esophageal precautions including continuation of a soft diet to ease esophageal clearance. Patient able to demonstrate  and verbalize understanding of precautions provided and is independent with use of strategies. Recommend f/u with Dr. Elnoria Howard after d/c from hospital. Inpatient GI consultation may be beneficial if hospitalization is expected to be prolonged or symptoms worsen and begin to significantly affect po intake. No further SLP needs indicated at this time. Signing off.     Aspiration Risk  Mild     Diet Recommendation Dysphagia 3 (Mechanical Soft);Thin liquid   Liquid Administration via: Cup;Straw Medication Administration: Whole meds with liquid (one at a time) Supervision: Patient able to self feed Compensations: Slow rate;Small sips/bites;Multiple dry swallows after each bite/sip;Follow solids with liquid Postural Changes and/or Swallow Maneuvers: Seated upright 90 degrees;Upright 30-60 min after meal    Other  Recommendations Recommended Consults: Consider GI evaluation Oral Care Recommendations: Oral care BID   Follow Up Recommendations  None    Frequency and Duration        Pertinent Vitals/Pain None reported       Swallow Study    General HPI: 62 year old female with PMH of COPD, GERD, hiatal hernia, PNA, TIA, radiation treatment to baseof tongue for right neck node (base of tongue cancer), G-tube placement 8/4 while undergoing chemo and radiation treatment, CVA with residual left sided weakness admitted for right femoropopliteal revision with extension from prior above-knee popliteal anastomosis 2 below knee popliteal artery with reversed small saphenous vein. C/o increased difficulty swallowing post-op although also reported dysphagia prior to admission. Currently under the care of Dr. Elnoria Howard. S/p esophageal dilation 1 year ago.  Type of Study: Bedside swallow evaluation Previous Swallow Assessment: Most recent barium swallow 6/26: barium pill lodges in the distal esophagus suggesting a short segment distal esophageal stricture. No definite reflux could be demonstrated. Penetration of barium. No definite aspiration Diet Prior to this Study: Dysphagia 3 (soft);Thin liquids Temperature Spikes Noted: No Respiratory Status: Room air History of Recent Intubation: No Behavior/Cognition: Cooperative;Alert;Pleasant mood Oral Cavity - Dentition: Dentures, top;Dentures, bottom Self-Feeding Abilities: Able to feed self Patient Positioning: Upright in bed Baseline Vocal Quality:  Clear Volitional Cough: Strong Volitional Swallow: Able to elicit    Oral/Motor/Sensory Function Overall Oral Motor/Sensory Function: Appears within functional limits for tasks assessed (tongue dry, poor taste due to radiation treatment)   Ice Chips Ice chips: Not tested   Thin Liquid Thin Liquid: Impaired Pharyngeal  Phase Impairments: Multiple swallows    Nectar Thick Nectar Thick Liquid: Not tested   Honey Thick Honey Thick Liquid: Not tested   Puree Puree: Impaired Presentation: Self Fed;Spoon Pharyngeal Phase Impairments: Multiple swallows   Solid   GO   Eniola Cerullo MA, CCC-SLP [phone]  Solid: Impaired Presentation: Self Fed Pharyngeal Phase Impairments: Multiple swallows (c/o globus)       Ricky Gallery Meryl 02/08/2013,9:07 AM

## 2013-02-08 NOTE — Progress Notes (Signed)
Inpatient Diabetes Program Recommendations  AACE/ADA: New Consensus Statement on Inpatient Glycemic Control (2013)  Target Ranges:  Prepandial:   less than 140 mg/dL      Peak postprandial:   less than 180 mg/dL (1-2 hours)      Critically ill patients:  140 - 180 mg/dL     Results for Brenda [name], Brenda [name] (MRN [id number]) as of 02/08/2013 11:09  Ref. Range 02/07/2013 06:34 02/07/2013 11:14 02/07/2013 16:01 02/07/2013 21:33  Glucose-Capillary Latest Range: 70-99 mg/dL 409 (H) 811 (H) 914 (H) 133 (H)    Results for Brenda [name], Brenda [name] (MRN [id number]) as of 02/08/2013 11:09  Ref. Range 02/08/2013 06:42  Glucose-Capillary Latest Range: 70-99 mg/dL 086 (H)    **Per home records, patient takes the following insulin at home: Lantus 16 units daily (if CBG <250 mg/dl) or Lantus 20 units daily (if CBG >250 mg/dl) Apidra 4-8 units QID per SSI  So far, CBGs well managed.  Per notes, PO intake poor and patient only eating 25% of meals.  **May need addition of basal insulin at some point, but for now looks ok.  If decision made to start basal insulin, please start 1/2 home dose and titrate upward as needed.   Will follow. Ambrose Finland RN, MSN, CDE Diabetes Coordinator Inpatient Diabetes Program [phone]

## 2013-02-09 LAB — GLUCOSE, CAPILLARY: Glucose-Capillary: 183 mg/dL — ABNORMAL HIGH (ref 70–99)

## 2013-02-09 NOTE — Progress Notes (Addendum)
[facility] Progress Note  02/09/2013 7:26 AM 4 Days Post-Op  Subjective:  Ready to go home-feeling much better.  Ambulated past nursing station last pm.  Afebrile x 24 hrs VSS 92% RA  Filed Vitals:   02/09/13 0500  BP: 107/61  Pulse: 69  Temp: 98.5 F (36.9 C)  Resp: 20    Physical Exam: Incisions:  All are c/d/i Extremities:  Right foot is warm and well perfused.  CBC    Component Value Date/Time   WBC 4.6 02/06/2013 0602   WBC 5.1 08/21/2012 1256   RBC 4.24 02/06/2013 0602   RBC 4.85 08/21/2012 1256   HGB 13.5 02/06/2013 0602   HGB 15.6 08/21/2012 1256   HCT 39.3 02/06/2013 0602   HCT 44.6 08/21/2012 1256   PLT 133* 02/06/2013 0602   PLT 192 08/21/2012 1256   MCV 92.7 02/06/2013 0602   MCV 92.0 08/21/2012 1256   MCH 31.8 02/06/2013 0602   MCH 32.2 08/21/2012 1256   MCHC 34.4 02/06/2013 0602   MCHC 34.9 08/21/2012 1256   RDW 13.4 02/06/2013 0602   RDW 13.8 08/21/2012 1256   LYMPHSABS 0.6* 08/21/2012 1256   LYMPHSABS 0.8 07/23/2011 1905   MONOABS 0.3 08/21/2012 1256   MONOABS 0.3 07/23/2011 1905   EOSABS 0.0 08/21/2012 1256   EOSABS 0.1 07/23/2011 1905   BASOSABS 0.1 08/21/2012 1256   BASOSABS 0.1 07/23/2011 1905    BMET    Component Value Date/Time   NA 136 02/06/2013 0602   NA 136 08/21/2012 1256   NA 139 08/20/2011 1202   K 4.0 02/06/2013 0602   K 4.1 08/21/2012 1256   K 4.1 08/20/2011 1202   CL 100 02/06/2013 0602   CL 100 08/21/2012 1256   CL 90* 08/20/2011 1202   CO2 27 02/06/2013 0602   CO2 28 08/21/2012 1256   CO2 31 08/20/2011 1202   GLUCOSE 128* 02/06/2013 0602   GLUCOSE 315* 08/21/2012 1256   GLUCOSE 261* 08/20/2011 1202   BUN 10 02/06/2013 0602   BUN 14.6 08/21/2012 1256   BUN 11 08/20/2011 1202   CREATININE 0.67 02/06/2013 0602   CREATININE 0.74 11/20/2012 1019   CREATININE 1.3* 08/21/2012 1256   CALCIUM 8.5 02/06/2013 0602   CALCIUM 9.1 08/21/2012 1256   CALCIUM 9.1 08/20/2011 1202   GFRNONAA >90 02/06/2013 0602   GFRAA >90 02/06/2013 0602    INR    Component Value  Date/Time   INR 0.87 02/04/2013 1324     Intake/Output Summary (Last 24 hours) at 02/09/13 0726 Last data filed at 02/09/13 0500  Gross per 24 hour  Intake    710 ml  Output   1500 ml  Net   -790 ml     Assessment/Plan:  62 y.o. female is s/p:  Right femoropopliteal revision with extension from prior above-knee popliteal anastomosis 2 below knee popliteal artery with reversed small saphenous vein  4 Days Post-Op  -doing well this am -will discharge home today -spoke with pt to have her make appt with Dr. Elnoria Howard in the next 1-2 weeks for her dysphagia -she will return to see Dr. Arbie Cookey in 3 weeks. -DVT prophylaxis:  Lovenox   Doreatha Massed, PA-C [facility] [phone] 02/09/2013 7:26 AM  Addendum  I have independently interviewed and examined the patient, and I agree with the physician assistant's findings.  Tol. dysphagia diet.  She will need to follow up with GI as outpt for likely esophageal dilation.  ABI reviewed.  Pt sx improved despite ABI results.  Inc all ok and pain well controlled.  Ok to D/C today once all home needs arranged.  Leonides Sake, MD [facility] [place] Office: [phone] Pager: [phone]  02/09/2013, 7:52 AM

## 2013-02-09 NOTE — Progress Notes (Signed)
9/9/14Nursing note  Patient given discharge instructions, AVS and paper prescription given to patient. All questions answered will discharge home as ordered. Dae Highley, Randall An RN

## 2013-02-09 NOTE — Progress Notes (Signed)
Physical Therapy Treatment Patient Details Name: Brenda [name] MRN: [id number] DOB: [birth year] Today's Date: 02/09/2013 Time: 0865-7846 PT Time Calculation (min): 25 min  PT Assessment / Plan / Recommendation  History of Present Illness Pt. is s/p R fem pop revision   PT Comments   Pt with excellent progression with mobility today able to bear weight on RLE and place foot flat. Will continue to follow to increase gait and ROM RLE.   Follow Up Recommendations  Home health PT;Supervision - Intermittent     Does the patient have the potential to tolerate intense rehabilitation     Barriers to Discharge        Equipment Recommendations  Rolling walker with 5" wheels    Recommendations for Other Services    Frequency     Progress towards PT Goals Progress towards PT goals: Goals met and updated - see care plan  Plan Discharge plan needs to be updated    Precautions / Restrictions Precautions Precautions: Fall Restrictions Weight Bearing Restrictions: No (difficulty amb)   Pertinent Vitals/Pain 5/10 RLE pain    Mobility  Bed Mobility Bed Mobility: Supine to Sit Supine to Sit: 6: Modified independent (Device/Increase time);With rails;HOB flat Sitting - Scoot to Edge of Bed: 6: Modified independent (Device/Increase time) Transfers Sit to Stand: 5: Supervision;From bed Stand to Sit: 5: Supervision;To chair/3-in-1 Details for Transfer Assistance: cueing for hand placement Ambulation/Gait Ambulation/Gait Assistance: 5: Supervision Ambulation Distance (Feet): 300 Feet Assistive device: Rolling walker Ambulation/Gait Assistance Details: cueing for increased heel strike, trunk extension and decreased reliance on RW Gait Pattern: Step-through pattern;Decreased stride length Gait velocity: decreased Stairs: Yes Stairs Assistance: 5: Supervision Stairs Assistance Details (indicate cue type and reason): cueing for step sequence only Stair Management Technique: One rail  Right Number of Stairs: 3    Exercises General Exercises - Lower Extremity Quad Sets: AROM;Right;10 reps;Seated Long Arc Quad: AROM;Both;20 reps;Seated Hip Flexion/Marching: AROM;Both;20 reps;Seated Toe Raises: AROM;Right;20 reps;Seated   PT Diagnosis:    PT Problem List:   PT Treatment Interventions:     PT Goals (current goals can now be found in the care plan section)    Visit Information  Last PT Received On: 02/09/13 Assistance Needed: +1 History of Present Illness: Pt. is s/p R fem pop revision    Subjective Data      Cognition  Cognition Arousal/Alertness: Awake/alert Behavior During Therapy: WFL for tasks assessed/performed Overall Cognitive Status: Within Functional Limits for tasks assessed    Balance     End of Session PT - End of Session Equipment Utilized During Treatment: Gait belt Activity Tolerance: Patient tolerated treatment well Patient left: in chair;with call bell/phone within reach   GP     Delorse Lek 02/09/2013, 10:28 AM Delaney Meigs, PT [phone]

## 2013-02-09 NOTE — Care Management Note (Signed)
    Page 1 of 1   02/09/2013     2:09:06 PM   CARE MANAGEMENT NOTE 02/09/2013  Patient:  Brenda [name], Brenda [name]   Account Number:  [id number]  Date Initiated:  02/09/2013  Documentation initiated by:  Cedric Denison  Subjective/Objective Assessment:   PT S/P RT FEM POP REVISION ON 02/05/13.  PTA, PT RESIDES AT HOME WITH SPOUSE AND DAUGHTER.     Action/Plan:   FAMILY TO PROVIDE CARE AT DC.  WILL NEED HH FOLLOW UP; REFERRAL TO [facility] FOR HHPT, PER PT CHOICE.  PT HAS RW AT HOME.   Anticipated DC Date:  02/09/2013   Anticipated DC Plan:  HOME W HOME HEALTH SERVICES      DC Planning Services  CM consult      [facility] Choice  HOME HEALTH   Choice offered to / List presented to:  C-1 Patient        HH arranged  HH-2 PT      [facility] agency  [facility]   Status of service:  Completed, signed off Medicare Important Message given?   (If response is "NO", the following Medicare IM given date fields will be blank) Date Medicare IM given:   Date Additional Medicare IM given:    Discharge Disposition:  HOME W HOME HEALTH SERVICES  Per UR Regulation:  Reviewed for med. necessity/level of care/duration of stay  If discussed at Long Length of Stay Meetings, dates discussed:    Comments:

## 2013-02-09 NOTE — Discharge Summary (Signed)
[facility] Discharge Summary  Brenda [name] [birth year] 62 y.o. female  [id number]  Admission Date: 02/05/2013  Discharge Date: 02/09/13  Physician: Larina Earthly, MD  Admission Diagnosis: claudication   HPI:   This is a 62 y.o. female well-known to myself from prior bilateral femoral-popliteal bypasses. She had a very advanced early atherosclerotic changes. She underwent a right femoral to above-knee popliteal bypass by myself in January of 1999 and a left femoral to below-knee popliteal bypass in January of 2000. She's done well since this time. We have not seen her in quite a few years. She recently began having severely limiting claudication in her right leg and noninvasive studies suggested arterial insufficiency. She underwent a formal arteriogram on 11/26/2012 at [facility] hospital and I have reviewed these films. This shows a widely patent femoral popliteal bypasses bilaterally. She does have complete occlusion of her popliteal artery below the bypass on the right with reconstitution of the anterior tibial and tibioperoneal trunks. She denies any rest pain or tissue loss. She is severely limited by her calf claudication. She reports that she cannot walk from one end of her hot towels to another without having to stop and rest.  Hospital Course:  The patient was admitted to the hospital and taken to the operating room on 02/05/2013 and underwent: Right femoropopliteal revision with extension from prior above-knee popliteal anastomosis 2 below knee popliteal artery with reversed small saphenous vein    The pt tolerated the procedure well and was transported to the PACU in good condition. By POD 1, she was doing well, but still having pain issues.    Post operative ABI's are as follows:  RIGHT    LEFT     PRESSURE  WAVEFORM   PRESSURE  WAVEFORM   BRACHIAL  127  Triphasic  BRACHIAL  142  Triphasic   DP  59  Biphasic  DP  140  Triphasic   AT    AT     PT  55  Monophasic  PT   147  Triphasic   PER    PER     GREAT TOE   NA  GREAT TOE   NA     RIGHT  LEFT   ABI  0.42  1.04   Significant tibial disease documented by ABI (no significant improvement with bypass)  Post operatively, she did complain of trouble swallowing.  She states that she was having this trouble pre operatively and is a pt of Dr. Elnoria Howard who performed an esophageal dilatation ~ one year ago.  Swallow evaluation as follows: Clinical Impression  Patient presents with a suspected primary esophageal dysphagia given h/o esophageal narrowing s/p dilation, GERD, and c/o globus at bedside given what appears to be a functional oropharyngeal swallow without overt indication of aspiration. Education complete with patient regarding general esophageal precautions including continuation of a soft diet to ease esophageal clearance. Patient able to demonstrate and verbalize understanding of precautions provided and is independent with use of strategies. Recommend f/u with Dr. Elnoria Howard after d/c from hospital. Inpatient GI consultation may be beneficial if hospitalization is expected to be prolonged or symptoms worsen and begin to significantly affect po intake. No further SLP needs indicated at this time. Signing off.  Aspiration Risk  Mild  Diet Recommendation Dysphagia 3 (Mechanical Soft);Thin liquid  Liquid Administration via: Cup;Straw  Medication Administration: Whole meds with liquid (one at a time)  Supervision: Patient able to self feed  Compensations: Slow rate;Small sips/bites;Multiple dry  swallows after each bite/sip;Follow solids with liquid  Postural Changes and/or Swallow Maneuvers: Seated upright 90 degrees;Upright 30-60 min after meal   The diabetic coordinator was also consulted to help manage her diabetes.  She has been well controlled in the hospital.  The remainder of the hospital course consisted of increasing mobilization and increasing intake of solids without difficulty.  CBC    Component Value  Date/Time   WBC 4.6 02/06/2013 0602   WBC 5.1 08/21/2012 1256   RBC 4.24 02/06/2013 0602   RBC 4.85 08/21/2012 1256   HGB 13.5 02/06/2013 0602   HGB 15.6 08/21/2012 1256   HCT 39.3 02/06/2013 0602   HCT 44.6 08/21/2012 1256   PLT 133* 02/06/2013 0602   PLT 192 08/21/2012 1256   MCV 92.7 02/06/2013 0602   MCV 92.0 08/21/2012 1256   MCH 31.8 02/06/2013 0602   MCH 32.2 08/21/2012 1256   MCHC 34.4 02/06/2013 0602   MCHC 34.9 08/21/2012 1256   RDW 13.4 02/06/2013 0602   RDW 13.8 08/21/2012 1256   LYMPHSABS 0.6* 08/21/2012 1256   LYMPHSABS 0.8 07/23/2011 1905   MONOABS 0.3 08/21/2012 1256   MONOABS 0.3 07/23/2011 1905   EOSABS 0.0 08/21/2012 1256   EOSABS 0.1 07/23/2011 1905   BASOSABS 0.1 08/21/2012 1256   BASOSABS 0.1 07/23/2011 1905    BMET    Component Value Date/Time   NA 136 02/06/2013 0602   NA 136 08/21/2012 1256   NA 139 08/20/2011 1202   K 4.0 02/06/2013 0602   K 4.1 08/21/2012 1256   K 4.1 08/20/2011 1202   CL 100 02/06/2013 0602   CL 100 08/21/2012 1256   CL 90* 08/20/2011 1202   CO2 27 02/06/2013 0602   CO2 28 08/21/2012 1256   CO2 31 08/20/2011 1202   GLUCOSE 128* 02/06/2013 0602   GLUCOSE 315* 08/21/2012 1256   GLUCOSE 261* 08/20/2011 1202   BUN 10 02/06/2013 0602   BUN 14.6 08/21/2012 1256   BUN 11 08/20/2011 1202   CREATININE 0.67 02/06/2013 0602   CREATININE 0.74 11/20/2012 1019   CREATININE 1.3* 08/21/2012 1256   CALCIUM 8.5 02/06/2013 0602   CALCIUM 9.1 08/21/2012 1256   CALCIUM 9.1 08/20/2011 1202   GFRNONAA >90 02/06/2013 0602   GFRAA >90 02/06/2013 0602     Discharge Instructions:   The patient is discharged to home with extensive instructions on wound care and progressive ambulation.  They are instructed not to drive or perform any heavy lifting until returning to see the physician in his office.  Discharge Orders   Future Appointments Provider Department Dept Phone   02/23/2013 12:00 PM Larina Earthly, MD [facility] [phone]   05/11/2013 10:00 AM Maryln Gottron, MD [facility] RADIATION ONCOLOGY [phone]   08/20/2013 10:15 AM Krista Blue [facility] MEDICAL ONCOLOGY [phone]   08/20/2013 10:45 AM Myrtis Ser, NP [facility] MEDICAL ONCOLOGY [phone]   Future Orders Complete By Expires   Call MD for:  redness, tenderness, or signs of infection (pain, swelling, bleeding, redness, odor or green/yellow discharge around incision site)  As directed    Call MD for:  severe or increased pain, loss or decreased feeling  in affected limb(s)  As directed    Call MD for:  temperature >100.5  As directed    Discharge wound care:  As directed    Comments:     Shower daily with soap and water  starting 02/07/13   Driving Restrictions  As directed    Comments:     No driving for 2 weeks   Lifting restrictions  As directed    Comments:     No lifting for 4 weeks   Nursing communication  As directed    Scheduling Instructions:     Please give paper Rx to patient at discharge.  It is located on the paper chart.   Resume previous diet  As directed       Discharge Diagnosis:  claudication  Secondary Diagnosis: Patient Active Problem List   Diagnosis Date Noted  . Peripheral vascular disease, unspecified 01/26/2013  . Coronary artery disease-multiple stents- last cath 2007- Myoview low risk 4/13 12/24/2012  . COPD (chronic obstructive pulmonary disease) 12/24/2012  . Claudication in peripheral vascular disease 11/27/2012  . PVD-Hx of BFBPG '99 and '00. Dopplers OK 6/13   . Cancer of base of tongue- radiation 2010   . Hx of radiation therapy   . DM (diabetes mellitus), type 2, uncontrolled 07/23/2011  . TIA (transient ischemic attack)-2013 07/23/2011  . History of CVA (cerebrovascular accident) 07/23/2011  . Tobacco abuse 07/23/2011  . Hyperlipemia 07/23/2011   Past Medical History  Diagnosis Date  . History of DVT (deep vein thrombosis)   . Coronary artery disease     multiple stens  . COPD (chronic  obstructive pulmonary disease)   . Transient ischemic attack   . Depression   . Hx of radiation therapy 01/16/09 - 03/06/09    base of tongue, right neck node  . Peripheral arterial disease     status post bilateral femoropopliteal bypass graft performed by Dr. Tawanna Cooler early in the past with significant right lower extremity lifestyle limiting claudication  . Pneumonia   . Hypercholesteremia   . Chronic bronchitis   . Shortness of breath     "w/exertion and sometimes when laying down" (11/26/2012)  . Type II diabetes mellitus   . History of blood transfusion 1955  . GERD (gastroesophageal reflux disease)   . H/O hiatal hernia   . Stroke 2000    left side weakness remains (11/26/2012)  . Arthritis     "hands" (11/26/2012)  . 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 Myocardial infarction 2005    dr [name]  with [facility]       Medication List         acetaminophen 500 MG tablet  Commonly known as:  TYLENOL  Take 1,000 mg by mouth every 6 (six) hours as needed for pain.     aspirin EC 81 MG tablet  Take 81 mg by mouth daily.     betamethasone dipropionate 0.05 % cream  Commonly known as:  DIPROLENE  Apply 1 application topically 2 (two) times daily.     buPROPion 150 MG 24 hr tablet  Commonly known as:  WELLBUTRIN XL  Take 150 mg by mouth daily.     clopidogrel 75 MG tablet  Commonly known as:  PLAVIX  Take 75 mg by mouth daily.     CRANBERRY PO  Take 1 tablet by mouth daily.     docusate sodium 100 MG capsule  Commonly known as:  COLACE  Take 100 mg by mouth daily.     Fiber Caps  Take 2 capsules by mouth daily.     fish oil-omega-3 fatty acids 1000 MG capsule  Take 1 g by mouth 2 (two) times  daily.     insulin glargine 100 UNIT/ML injection  Commonly known as:  LANTUS  Inject 16-20 Units into the skin 2 (two) times daily. 16 units up to cbg of 250.  20 units if cbg is higher than 250     insulin glulisine 100 UNIT/ML injection   Commonly known as:  APIDRA  Inject 4-8 Units into the skin 4 (four) times daily as needed for high blood sugar. For blood sugar levels     multivitamin tablet  Take 1 tablet by mouth daily.     mupirocin ointment 2 %  Commonly known as:  BACTROBAN  1 Application, nasally, 2 times daily for 5 days prior to surgery     nitroGLYCERIN 0.4 MG/SPRAY spray  Commonly known as:  NITROLINGUAL  Place 1 spray under the tongue every 5 (five) minutes as needed for chest pain.     omeprazole 20 MG capsule  Commonly known as:  PRILOSEC  Take 20 mg by mouth daily.     ondansetron 4 MG disintegrating tablet  Commonly known as:  ZOFRAN-ODT  Take 4 mg by mouth every 8 (eight) hours as needed for nausea.     oxyCODONE 5 MG immediate release tablet  Commonly known as:  ROXICODONE  Take 1 tablet (5 mg total) by mouth every 6 (six) hours as needed for pain.     pilocarpine 5 MG tablet  Commonly known as:  SALAGEN  Take 5 mg by mouth 3 (three) times daily.     rosuvastatin 20 MG tablet  Commonly known as:  CRESTOR  Take 20 mg by mouth daily.     tiotropium 18 MCG inhalation capsule  Commonly known as:  SPIRIVA  Place 18 mcg into inhaler and inhale daily as needed. Shortness of breath        Roxicodone #30 No Refill  Disposition: home  Patient's condition: is Good  Follow up: 1. Dr. Arbie Cookey in 3 weeks 2. Dr. Elnoria Howard 1-2 weeks for dysphagia   Doreatha Massed, PA-C [facility] [phone] 02/09/2013  7:39 AM  - For VQI Registry use --- Instructions: Press F2 to tab through selections.  Delete question if not applicable.   Post-op:  Wound infection: No  Graft infection: No  Transfusion: No  If yes, n/a units given New Arrhythmia: No Ipsilateral amputation: No, [ ]  Minor, [ ]  BKA, [ ]  AKA Discharge patency: [x ] Primary, [ ]  Primary assisted, [ ]  Secondary, [ ]  Occluded Patency judged by: [x ] Dopper only, [ ]  Palpable graft pulse, [ ]  Palpable distal pulse, [ ]  ABI  inc. > 0.15, [ ]  Duplex Discharge ABI: R 0.42, L 1.04 Discharge TBI: R , L  D/C Ambulatory Status: Ambulatory  Complications: MI: No, [ ]  Troponin only, [ ]  EKG or Clinical CHF: No Resp failure:No, [ ]  Pneumonia, [ ]  Ventilator Chg in renal function: No, [ ]  Inc. Cr > 0.5, [ ]  Temp. Dialysis, [ ]  Permanent dialysis Stroke: No, [ ]  Minor, [ ]  Major Return to OR: No  Reason for return to OR: [ ]  Bleeding, [ ]  Infection, [ ]  Thrombosis, [ ]  Revision  Discharge medications: Statin use:  Yes ASA use:  Yes Plavix use:  Yes Beta blocker use: No  for medical reason not on preoperatively Coumadin use: No  for medical reason on plavix

## 2013-02-22 ENCOUNTER — Encounter: Payer: Self-pay | Admitting: Vascular Surgery

## 2013-02-23 ENCOUNTER — Ambulatory Visit (INDEPENDENT_AMBULATORY_CARE_PROVIDER_SITE_OTHER): Payer: Self-pay | Admitting: Vascular Surgery

## 2013-02-23 ENCOUNTER — Encounter: Payer: Self-pay | Admitting: Vascular Surgery

## 2013-02-23 VITALS — BP 136/75 | HR 82 | Resp 18 | Ht 66.0 in | Wt 135.0 lb

## 2013-02-23 DIAGNOSIS — Z48812 Encounter for surgical aftercare following surgery on the circulatory system: Secondary | ICD-10-CM

## 2013-02-23 DIAGNOSIS — I739 Peripheral vascular disease, unspecified: Secondary | ICD-10-CM

## 2013-02-23 NOTE — Progress Notes (Signed)
The patient has today for followup of her right above-knee to below-knee popliteal bypass with reversed small saphenous vein on 02/05/2013. She had a remote history of femoral-popliteal bypass 14 years earlier. She had a recurrent limiting claudication and arteriogram revealed occlusion of her popliteal artery. She had small saphenous vein harvest and above-knee anastomosis at the junction of her old femoropopliteal bypass down to the below knee popliteal artery. She did have extreme calcification of the below-knee popliteal artery extending out onto her anterior tibial artery takeoff and tibioperoneal trunk with a complete calcification as far as could be felt distally. She has done well following the surgery. She does have a moderate amount of swelling with walking and this is slightly better when she first arise in the morning. She has had no claudication type symptoms.  Her vein harvest incision the posterior calf is healing well as is her above-knee and below-knee popliteal incisions. She does have popliteal graft pulse.  She will continue her walking program and we will see her again in 3 months with office visit and noninvasive studies. He'll notify should she have any wound problems or ischemic symptoms in the interim

## 2013-02-24 NOTE — Addendum Note (Signed)
Addended by: Sharee Pimple on: 02/24/2013 11:26 AM   Modules accepted: Orders

## 2013-05-11 ENCOUNTER — Telehealth: Payer: Self-pay | Admitting: *Deleted

## 2013-05-11 ENCOUNTER — Ambulatory Visit: Payer: Medicare Other | Admitting: Radiation Oncology

## 2013-05-11 NOTE — Telephone Encounter (Signed)
Called patient to ask about coming another day due to Dr. Dayton Scrape needing to go to the Dr. Rennis Golden, spoke with patient and she is coming on 05-18-13.

## 2013-05-18 ENCOUNTER — Ambulatory Visit: Payer: Medicare Other | Admitting: Radiation Oncology

## 2013-05-24 ENCOUNTER — Encounter: Payer: Self-pay | Admitting: Vascular Surgery

## 2013-05-25 ENCOUNTER — Ambulatory Visit (INDEPENDENT_AMBULATORY_CARE_PROVIDER_SITE_OTHER): Payer: Self-pay | Admitting: Vascular Surgery

## 2013-05-25 ENCOUNTER — Ambulatory Visit (HOSPITAL_COMMUNITY)
Admission: RE | Admit: 2013-05-25 | Discharge: 2013-05-25 | Disposition: A | Discharge status: Home / Self Care | Payer: Medicare Other | Source: Ambulatory Visit | Attending: Vascular Surgery | Admitting: Vascular Surgery

## 2013-05-25 ENCOUNTER — Ambulatory Visit (INDEPENDENT_AMBULATORY_CARE_PROVIDER_SITE_OTHER)
Admission: RE | Admit: 2013-05-25 | Discharge: 2013-05-25 | Disposition: A | Discharge status: Home / Self Care | Payer: Medicare Other | Source: Ambulatory Visit | Attending: Vascular Surgery | Admitting: Vascular Surgery

## 2013-05-25 ENCOUNTER — Encounter: Payer: Self-pay | Admitting: Vascular Surgery

## 2013-05-25 ENCOUNTER — Encounter (HOSPITAL_COMMUNITY): Payer: Medicare Other

## 2013-05-25 VITALS — BP 130/60 | HR 84 | Ht 66.0 in | Wt 134.9 lb

## 2013-05-25 DIAGNOSIS — I739 Peripheral vascular disease, unspecified: Secondary | ICD-10-CM | POA: Insufficient documentation

## 2013-05-25 DIAGNOSIS — Z48812 Encounter for surgical aftercare following surgery on the circulatory system: Secondary | ICD-10-CM

## 2013-05-25 DIAGNOSIS — E119 Type 2 diabetes mellitus without complications: Secondary | ICD-10-CM | POA: Insufficient documentation

## 2013-05-25 DIAGNOSIS — Z09 Encounter for follow-up examination after completed treatment for conditions other than malignant neoplasm: Secondary | ICD-10-CM | POA: Insufficient documentation

## 2013-05-25 DIAGNOSIS — E785 Hyperlipidemia, unspecified: Secondary | ICD-10-CM | POA: Insufficient documentation

## 2013-05-25 NOTE — Progress Notes (Signed)
[facility] [place] HISTORY AND PHYSICAL   CC:  F/u for PAD  Brenda Needle, MD  HPI: This is a 62 y.o. [name] who is s/p right femoral to popliteal bypas graft 06/14/97 and then a revision with extension from prior above knee popliteal anastomosis to below knee popliteal artery with reversed small saphenous vein on 02/05/13.  She also has a hx of left femoral to popliteal bypass graft with translocated non-reversed saphenous vein on 06/10/1998.  She states that she has been doing well and her only complaint today is cramping in her calves at night.  She does not have claudication when she walks.  She states that she has tried eating bananas, vinegar, mustard, and other remedies.    She states that she does have hx of CVA in the past with left sided weakness.  She is on a statin for her hypercholesterolemia.  She also takes insulin for her diabetes.  Past Medical History  Diagnosis Date  . History of DVT (deep vein thrombosis)   . Coronary artery disease     multiple stens  . COPD (chronic obstructive pulmonary disease)   . Transient ischemic attack   . Depression   . Hx of radiation therapy 01/16/09 - 03/06/09    base of tongue, right neck node  . Peripheral arterial disease     status post bilateral femoropopliteal bypass graft performed by Dr. Tawanna Cooler early in the past with significant right lower extremity lifestyle limiting claudication  . Pneumonia   . Hypercholesteremia   . Chronic bronchitis   . Shortness of breath     "w/exertion and sometimes when laying down" (11/26/2012)  . Type II diabetes mellitus   . History of blood transfusion 1955  . GERD (gastroesophageal reflux disease)   . H/O hiatal hernia   . Stroke 2000    left side weakness remains (11/26/2012)  . Arthritis     "hands" (11/26/2012)  . 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 Myocardial infarction 2005    dr [name]  with [facility]   Past Surgical History   Procedure Laterality Date  . Tee without cardioversion  07/26/2011    Procedure: TRANSESOPHAGEAL ECHOCARDIOGRAM (TEE);  Surgeon: Chrystie Nose, MD;  Location[facility] ENDOSCOPY;  Service: Cardiovascular;  Laterality: N/A;  . Esophagogastroduodenoscopy  12/20/2011    Procedure: ESOPHAGOGASTRODUODENOSCOPY (EGD);  Surgeon: Theda Belfast, MD;  Location: [facility] ENDOSCOPY;  Service: Endoscopy;  Laterality: N/A;  EGD with balloon dilation  . Savory dilation  12/20/2011    Procedure: SAVORY DILATION;  Surgeon: Theda Belfast, MD;  Location: [facility] ENDOSCOPY;  Service: Endoscopy;  Laterality: N/A;  . Tonsillectomy  1969  . Appendectomy  1962  . Cardiac catheterization  1999; 11/26/2012  . Coronary angioplasty with stent placement  1999--2005    "i think I have 3-5 total" (11/26/2012)  . Femoral bypass  1999; 2000    [name] 11/26/2012  . Carpal tunnel release Right 1980's?  . Vaginal hysterectomy  1988  . Cesarean section  1978  . Tubal ligation  1988  . Cataract extraction w/ intraocular lens implant Right 2011  . Ankle fracture surgery Right 2007    "got a metal plate and 8 screws in it" (11/26/2012)  . Bypass graft popliteal to popliteal Right 02/05/2013    Procedure: BYPASS GRAFT ABOVE KNEE POPLITEAL TO BELOW KNEE POPLITEAL WITH SMALL SAPHANOUS VEIN;  Surgeon: Larina Earthly, MD;  Location: [facility] OR;  Service: Vascular;  Laterality: Right;    Allergies  Allergen Reactions  . Tape Hives  . Nicoderm [Nicotine] Rash    To the PATCH only, breakouts on skin    Current Outpatient Prescriptions  Medication Sig Dispense Refill  . acetaminophen (TYLENOL) 500 MG tablet Take 1,000 mg by mouth every 6 (six) hours as needed for pain.      Marland Kitchen aspirin EC 81 MG tablet Take 81 mg by mouth daily.      . betamethasone dipropionate (DIPROLENE) 0.05 % cream Apply 1 application topically 2 (two) times daily.      Marland Kitchen buPROPion (WELLBUTRIN XL) 150 MG 24 hr tablet Take 150 mg by mouth daily.      . clopidogrel (PLAVIX) Brenda MG tablet  Take Brenda mg by mouth daily.       Marland Kitchen CRANBERRY PO Take 1 tablet by mouth daily.      Marland Kitchen docusate sodium (COLACE) 100 MG capsule Take 100 mg by mouth daily.      . Fiber CAPS Take 2 capsules by mouth daily.       . fish oil-omega-3 fatty acids 1000 MG capsule Take 1 g by mouth 2 (two) times daily.       . Fluticasone Furoate-Vilanterol (BREO ELLIPTA IN) Inhale 1 puff into the lungs daily.      . insulin glargine (LANTUS) 100 UNIT/ML injection Inject 16-20 Units into the skin 2 (two) times daily. 16 units up to cbg of 250.  20 units if cbg is higher than 250      . insulin glulisine (APIDRA) 100 UNIT/ML injection Inject 4-8 Units into the skin 4 (four) times daily as needed for high blood sugar. For blood sugar levels      . Multiple Vitamin (MULTIVITAMIN) tablet Take 1 tablet by mouth daily.      . nitroGLYCERIN (NITROLINGUAL) 0.4 MG/SPRAY spray Place 1 spray under the tongue every 5 (five) minutes as needed for chest pain.      Marland Kitchen omeprazole (PRILOSEC) 20 MG capsule Take 20 mg by mouth daily.       . ondansetron (ZOFRAN-ODT) 4 MG disintegrating tablet Take 4 mg by mouth every 8 (eight) hours as needed for nausea.      . pilocarpine (SALAGEN) 5 MG tablet Take 5 mg by mouth 3 (three) times daily.      . rosuvastatin (CRESTOR) 20 MG tablet Take 20 mg by mouth daily.      . mupirocin ointment (BACTROBAN) 2 % 1 Application, nasally, 2 times daily for 5 days prior to surgery  22 g  0  . oxyCODONE (ROXICODONE) 5 MG immediate release tablet Take 1 tablet (5 mg total) by mouth every 6 (six) hours as needed for pain.  30 tablet  0  . tiotropium (SPIRIVA) 18 MCG inhalation capsule Place 18 mcg into inhaler and inhale daily as needed. Shortness of breath       No current facility-administered medications for this visit.    Family History  Problem Relation Age of Onset  . Cancer Mother     pancreatic  . Diabetes Mother   . Hyperlipidemia Mother   . Hypertension Mother   . Other Mother     varicose vein   . Cancer Father 72    throat  . Heart disease Father   . Heart attack Father   . Cancer Sister     breast  . Diabetes Sister   . Deep vein thrombosis  Brother   . Diabetes Brother   . Hearing loss Brother   . Hyperlipidemia Brother   . Hypertension Brother   . Heart attack Brother   . Clotting disorder Brother   . Diabetes Son   . Hyperlipidemia Son     History   Social History  . Marital Status: Married    Spouse Name: N/A    Number of Children: N/A  . Years of Education: N/A   Occupational History  . Not on file.   Social History Main Topics  . Smoking status: Current Every Day Smoker -- 1.00 packs/day for 50 years    Types: Cigarettes  . Smokeless tobacco: Never Used  . Alcohol Use: 6.0 oz/week    10 Glasses of wine per week  . Drug Use: No  . Sexual Activity: Yes   Other Topics Concern  . Not on file   Social History Narrative  . No narrative on file     ROS: [x]  Positive   [ ]  Negative   [ x] All sytems reviewed and are negative  Cardiovascular: []  chest pain/pressure []  palpitations []  SOB lying flat []  DOE []  pain in legs while walking []  pain in feet when lying flat []  hx of DVT []  hx of phlebitis []  swelling in legs []  varicose veins  Pulmonary: []  productive cough []  asthma []  wheezing  Neurologic: []  weakness in []  arms []  legs []  numbness in []  arms []  legs [] difficulty speaking or slurred speech []  temporary loss of vision in one eye []  dizziness  Hematologic: []  bleeding problems []  problems with blood clotting easily  GI []  vomiting blood []  blood in stool  GU: []  burning with urination []  blood in urine  Psychiatric: []  hx of major depression  Integumentary: []  rashes []  ulcers  Constitutional: []  fever []  chills   PHYSICAL EXAMINATION:  Filed Vitals:   05/25/13 1515  BP: 130/60  Pulse: 84   Body mass index is 21.78 kg/(m^2).  General:  WDWN in NAD Gait: Not observed HENT: WNL,  normocephalic Eyes: Pupils equal Pulmonary: normal non-labored breathing , without Rales, rhonchi,  wheezing Cardiac: RRR, without  Murmurs, rubs or gallops; with a rigtcarotid bruit-no bruit is heard on the lef Abdomen: soft, NT, no masses; she does have a palpable aorta, but does not feel enlarged. Skin: without rashes, without ulcers  Vascular Exam/Pulses: + bilateral palpable femoral pulses; + palpable left DP; + palpable bilateral popliteal pulses.  Extremities: without ischemic changes, without Gangrene , without cellulitis; without open wounds;  Musculoskeletal: no muscle wasting or atrophy  Neurologic: A&O X 3; Appropriate Affect ; SENSATION: normal; MOTOR FUNCTION:  moving all extremities equally. Speech is fluent/normal   Non-Invasive Vascular Imaging:  ABI's 05/25/13: Right 0.Brenda Left:  1.26  Pt meds includes: Statin:  yes Beta Blocker:  no Aspirin:  yes ACEI:  no ARB:  no Other Antiplatelet/Anticoagulant:  yes   ASSESSMENT: 62 y.o. [name] with hx of bilateral femoral to popliteal bypass grafts with a revision on the right in September 2014.  She is doing well from her PAD and her ABI's are improved from postoperatively.  PLAN: -She does have a right carotid bruit and she states that she is being followed by Dr. Juleen China for this.  She did have a CTA of the neck in February 2014 that did not show any significant stenosis.  This will need to be continued to be followed closely given her hx of CVA. -pt did have a CTA  of her abdomen in 2012 which revealed advanced atherosclerotic calcification involving her aorta and iliac arteries but no focal aneurysm or dissection. -will have her f/u in 6 months with ABI's and duplex of her bypass grafts bilaterally.   Doreatha Massed, PA-C [facility] [phone]   Clinic MD:  Pt seen and examined in conjunction with Dr. Arbie Cookey  I have examined the patient, reviewed and agree with above.  EARLY, TODD,  MD 05/25/2013 4:14 PM

## 2013-06-08 ENCOUNTER — Ambulatory Visit
Admission: RE | Admit: 2013-06-08 | Discharge: 2013-06-08 | Disposition: A | Discharge status: Home / Self Care | Payer: Medicare Other | Source: Ambulatory Visit | Attending: Radiation Oncology | Admitting: Radiation Oncology

## 2013-06-08 VITALS — BP 117/79 | HR 84 | Temp 97.9°F | Wt 130.2 lb

## 2013-06-08 DIAGNOSIS — C01 Malignant neoplasm of base of tongue: Secondary | ICD-10-CM

## 2013-06-08 NOTE — Progress Notes (Signed)
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

## 2013-06-08 NOTE — Progress Notes (Signed)
CC: Dr. 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 She still doing well except for a persistent cough related to bronchitis since October 2014. Unfortunately, she still smokes up to one pack of cigarettes a day. She missed her last appointment with Dr. Redmond Baseman and she will try to reschedule. She will see medical oncology for followup in March of 2015.  Physical examination: Alert and oriented. Filed Vitals:   06/08/13 1104  BP: 117/79  Pulse: 84  Temp: 97.9 F (36.6 C)   Head and neck examination: There is no palpable lymphadenopathy in the neck. Oral cavity and oropharynx are unremarkable to inspection with the exception of mild xerostomia. Indirect mirror examination likewise unremarkable. No palpable evidence for recurrent disease along her right tongue base. Cranial nerves II through XII intact.  Impression: Satisfactory progress with no evidence for recurrent disease. I again emphasized the need for her to stop smoking. She is tried nicotine patches, Chantix, and he cigarettes with no success.  Plan: Follow this with medical oncology in March of 2015. I told her to see Dr. Redmond Baseman for a followup visit this summer, and he will probably see her then once a year for long-term followup.

## 2013-06-18 ENCOUNTER — Telehealth: Payer: Self-pay | Admitting: Hematology and Oncology

## 2013-06-18 NOTE — Telephone Encounter (Signed)
FORMER HH PT. MOVED APPT FROM [facility] ON 3/20 TO [facility] ON 3/23. [facility] FOR PT AND MAILED SCHEDULE

## 2013-08-12 ENCOUNTER — Telehealth (HOSPITAL_COMMUNITY): Payer: Self-pay | Admitting: *Deleted

## 2013-08-12 ENCOUNTER — Other Ambulatory Visit (HOSPITAL_COMMUNITY): Payer: Self-pay | Admitting: Cardiovascular Disease

## 2013-08-12 DIAGNOSIS — I701 Atherosclerosis of renal artery: Secondary | ICD-10-CM

## 2013-08-20 ENCOUNTER — Other Ambulatory Visit: Payer: Medicare Other

## 2013-08-20 ENCOUNTER — Ambulatory Visit: Payer: Medicare Other | Admitting: Oncology

## 2013-08-20 ENCOUNTER — Encounter (HOSPITAL_COMMUNITY): Payer: Medicare Other

## 2013-08-23 ENCOUNTER — Encounter: Payer: Self-pay | Admitting: Hematology and Oncology

## 2013-08-23 ENCOUNTER — Other Ambulatory Visit (HOSPITAL_BASED_OUTPATIENT_CLINIC_OR_DEPARTMENT_OTHER): Payer: Medicare Other

## 2013-08-23 ENCOUNTER — Other Ambulatory Visit: Payer: Self-pay | Admitting: Hematology and Oncology

## 2013-08-23 ENCOUNTER — Ambulatory Visit (HOSPITAL_BASED_OUTPATIENT_CLINIC_OR_DEPARTMENT_OTHER): Payer: Medicare Other | Admitting: Hematology and Oncology

## 2013-08-23 VITALS — BP 129/68 | HR 79 | Temp 97.8°F | Resp 18 | Ht 66.0 in | Wt 132.8 lb

## 2013-08-23 DIAGNOSIS — R5381 Other malaise: Secondary | ICD-10-CM

## 2013-08-23 DIAGNOSIS — C01 Malignant neoplasm of base of tongue: Secondary | ICD-10-CM

## 2013-08-23 DIAGNOSIS — F17200 Nicotine dependence, unspecified, uncomplicated: Secondary | ICD-10-CM

## 2013-08-23 DIAGNOSIS — R5383 Other fatigue: Secondary | ICD-10-CM | POA: Insufficient documentation

## 2013-08-23 DIAGNOSIS — Z720 Tobacco use: Secondary | ICD-10-CM

## 2013-08-23 DIAGNOSIS — Z85810 Personal history of malignant neoplasm of tongue: Secondary | ICD-10-CM

## 2013-08-23 LAB — TSH CHCC: TSH: 3.05 m[IU]/L (ref 0.308–3.960)

## 2013-08-23 LAB — T4, FREE: Free T4: 1.23 ng/dL (ref 0.80–1.80)

## 2013-08-23 NOTE — Progress Notes (Signed)
[facility] OFFICE PROGRESS NOTE  Patient Care Team: Anda Kraft, MD as PCP - General (Endocrinology) Melida Quitter, MD as Consulting Physician (Otolaryngology) Rexene Edison, MD as Consulting Physician (Radiation Oncology) Heath Lark, MD as Consulting Physician (Hematology and Oncology)  DIAGNOSIS: Squamous cell carcinoma of the right tongue base  SUMMARY OF ONCOLOGIC HISTORY: This patient was diagnosed with squamous cell carcinoma of the right tongue base in 2010. She underwent concurrent chemotherapy and radiation therapy with cetuximab. Her last imaging study in 2014 show no evidence of recurrence   INTERVAL HISTORY: Brenda [name] 63 y.o. female returns for further followup. The patient continued to smoke but is willing to quit. She denies any new tongue mass. Denies any new neck swelling. She still has persistent dry mouth. She had some swallowing difficulties with esophageal stricture known before, requiring dilatation.  I have reviewed the past medical history, past surgical history, social history and family history with the patient and they are unchanged from previous note.  ALLERGIES:  is allergic to tape and nicoderm.  MEDICATIONS:  Current Outpatient Prescriptions  Medication Sig Dispense Refill  . acetaminophen (TYLENOL) 500 MG tablet Take 1,000 mg by mouth every 6 (six) hours as needed for pain.      Marland Kitchen aspirin EC 81 MG tablet Take 81 mg by mouth daily.      Marland Kitchen buPROPion (WELLBUTRIN XL) 150 MG 24 hr tablet Take 150 mg by mouth daily.      . clopidogrel (PLAVIX) 75 MG tablet Take 75 mg by mouth daily.       Marland Kitchen docusate sodium (COLACE) 100 MG capsule Take 100 mg by mouth daily.      . Fiber CAPS Take 2 capsules by mouth daily.       . fish oil-omega-3 fatty acids 1000 MG capsule Take 1 g by mouth 2 (two) times daily.       . Fluticasone Furoate-Vilanterol (BREO ELLIPTA IN) Inhale 1 puff into the lungs daily.      . insulin glargine (LANTUS) 100 UNIT/ML  injection Inject 16-20 Units into the skin 2 (two) times daily. 16 units up to cbg of 250.  20 units if cbg is higher than 250      . insulin glulisine (APIDRA) 100 UNIT/ML injection Inject 4-8 Units into the skin 4 (four) times daily as needed for high blood sugar. For blood sugar levels      . Multiple Vitamin (MULTIVITAMIN) tablet Take 1 tablet by mouth daily.      . nitroGLYCERIN (NITROLINGUAL) 0.4 MG/SPRAY spray Place 1 spray under the tongue every 5 (five) minutes as needed for chest pain.      Marland Kitchen omeprazole (PRILOSEC) 20 MG capsule Take 20 mg by mouth daily.       . ondansetron (ZOFRAN-ODT) 4 MG disintegrating tablet Take 4 mg by mouth every 8 (eight) hours as needed for nausea.      . pilocarpine (SALAGEN) 5 MG tablet Take 5 mg by mouth 3 (three) times daily.      . rosuvastatin (CRESTOR) 20 MG tablet Take 20 mg by mouth daily.       No current facility-administered medications for this visit.    REVIEW OF SYSTEMS:   Constitutional: Denies fevers, chills or abnormal weight loss Eyes: Denies blurriness of vision Ears, nose, mouth, throat, and face: Denies mucositis or sore throat Respiratory: Denies cough, dyspnea or wheezes Cardiovascular: Denies palpitation, chest discomfort or lower extremity swelling Gastrointestinal:  Denies nausea, heartburn  or change in bowel habits Skin: Denies abnormal skin rashes Lymphatics: Denies new lymphadenopathy or easy bruising Neurological:Denies numbness, tingling or new weaknesses Behavioral/Psych: Mood is stable, no new changes  All other systems were reviewed with the patient and are negative.  PHYSICAL EXAMINATION: ECOG PERFORMANCE STATUS: 0 - Asymptomatic  Filed Vitals:   08/23/13 1209  BP: 129/68  Pulse: 79  Temp: 97.8 F (36.6 C)  Resp: 18   Filed Weights   08/23/13 1209  Weight: 132 lb 12.8 oz (60.238 kg)    GENERAL:alert, no distress and comfortable SKIN: skin color, texture, turgor are normal, no rashes or significant  lesions EYES: normal, Conjunctiva are pink and non-injected, sclera clear OROPHARYNX:no exudate, no erythema and lips, buccal mucosa, and tongue normal  NECK: Mild fibrosis from prior radiation LYMPH:  no palpable lymphadenopathy in the cervical, axillary or inguinal LUNGS: clear to auscultation and percussion with normal breathing effort HEART: regular rate & rhythm and no murmurs and no lower extremity edema ABDOMEN:abdomen soft, non-tender and normal bowel sounds Musculoskeletal:no cyanosis of digits and no clubbing  NEURO: alert & oriented x 3 with fluent speech, no focal motor/sensory deficits  LABORATORY DATA:  I have reviewed the data as listed    Component Value Date/Time   NA 136 02/06/2013 0602   NA 136 08/21/2012 1256   NA 139 08/20/2011 1202   K 4.0 02/06/2013 0602   K 4.1 08/21/2012 1256   K 4.1 08/20/2011 1202   CL 100 02/06/2013 0602   CL 100 08/21/2012 1256   CL 90* 08/20/2011 1202   CO2 27 02/06/2013 0602   CO2 28 08/21/2012 1256   CO2 31 08/20/2011 1202   GLUCOSE 128* 02/06/2013 0602   GLUCOSE 315* 08/21/2012 1256   GLUCOSE 261* 08/20/2011 1202   BUN 10 02/06/2013 0602   BUN 14.6 08/21/2012 1256   BUN 11 08/20/2011 1202   CREATININE 0.67 02/06/2013 0602   CREATININE 0.74 11/20/2012 1019   CREATININE 1.3* 08/21/2012 1256   CALCIUM 8.5 02/06/2013 0602   CALCIUM 9.1 08/21/2012 1256   CALCIUM 9.1 08/20/2011 1202   PROT 7.3 02/04/2013 1324   PROT 6.8 08/21/2012 1256   PROT 6.9 08/20/2011 1202   ALBUMIN 3.8 02/04/2013 1324   ALBUMIN 3.4* 08/21/2012 1256   AST 14 02/04/2013 1324   AST 18 08/21/2012 1256   AST 24 08/20/2011 1202   ALT 12 02/04/2013 1324   ALT 22 08/21/2012 1256   ALT 30 08/20/2011 1202   ALKPHOS 128* 02/04/2013 1324   ALKPHOS 153* 08/21/2012 1256   ALKPHOS 112* 08/20/2011 1202   BILITOT 0.4 02/04/2013 1324   BILITOT 0.49 08/21/2012 1256   BILITOT 0.70 08/20/2011 1202   GFRNONAA >90 02/06/2013 0602   GFRAA >90 02/06/2013 0602    No results found for this basename: SPEP, UPEP,  kappa and  lambda light chains    Lab Results  Component Value Date   WBC 4.6 02/06/2013   NEUTROABS 4.1 08/21/2012   HGB 13.5 02/06/2013   HCT 39.3 02/06/2013   MCV 92.7 02/06/2013   PLT 133* 02/06/2013      Chemistry      Component Value Date/Time   NA 136 02/06/2013 0602   NA 136 08/21/2012 1256   NA 139 08/20/2011 1202   K 4.0 02/06/2013 0602   K 4.1 08/21/2012 1256   K 4.1 08/20/2011 1202   CL 100 02/06/2013 0602   CL 100 08/21/2012 1256   CL 90* 08/20/2011 1202  CO2 27 02/06/2013 0602   CO2 28 08/21/2012 1256   CO2 31 08/20/2011 1202   BUN 10 02/06/2013 0602   BUN 14.6 08/21/2012 1256   BUN 11 08/20/2011 1202   CREATININE 0.67 02/06/2013 0602   CREATININE 0.74 11/20/2012 1019   CREATININE 1.3* 08/21/2012 1256      Component Value Date/Time   CALCIUM 8.5 02/06/2013 0602   CALCIUM 9.1 08/21/2012 1256   CALCIUM 9.1 08/20/2011 1202   ALKPHOS 128* 02/04/2013 1324   ALKPHOS 153* 08/21/2012 1256   ALKPHOS 112* 08/20/2011 1202   AST 14 02/04/2013 1324   AST 18 08/21/2012 1256   AST 24 08/20/2011 1202   ALT 12 02/04/2013 1324   ALT 22 08/21/2012 1256   ALT 30 08/20/2011 1202   BILITOT 0.4 02/04/2013 1324   BILITOT 0.49 08/21/2012 1256   BILITOT 0.70 08/20/2011 1202     ASSESSMENT & PLAN:  #1 tongue cancer I recommend the patient continue ENT followup. The patient has no evidence of recurrence. I would discontinue her routine surveillance visit from now on #2 tobacco abuse I spent time educating the patient importance of nicotine cessation but she is not interested to quit right now #3 fatigue I have checked her thyroid function test. Results are pending. I recommend she continue function test monitoring under the care of her primary care provider on an annual basis.   All questions were answered. The patient knows to call the clinic with any problems, questions or concerns. No barriers to learning was detected. I spent 15 minutes counseling the patient face to face. The total time spent in the appointment was 20  minutes and more than 50% was on counseling and review of test results     [facility], [place], MD 08/23/2013 12:30 PM

## 2013-08-25 ENCOUNTER — Telehealth: Payer: Self-pay | Admitting: *Deleted

## 2013-08-25 NOTE — Telephone Encounter (Signed)
Notified pt of results below

## 2013-08-25 NOTE — Telephone Encounter (Signed)
Message copied by Patton Salles on Wed Aug 25, 2013  1:32 PM ------      Message from: [facility], [place]: Wed Aug 25, 2013  1:26 PM      Regarding: thyroid test       Please let her know test result is normal. Continue followup with PCP for recheck once a year. ------

## 2013-09-08 ENCOUNTER — Encounter (HOSPITAL_COMMUNITY): Payer: Medicare Other

## 2013-09-16 ENCOUNTER — Ambulatory Visit (HOSPITAL_COMMUNITY)
Admission: RE | Admit: 2013-09-16 | Discharge: 2013-09-16 | Disposition: A | Discharge status: Home / Self Care | Payer: Medicare Other | Source: Ambulatory Visit | Attending: Cardiovascular Disease | Admitting: Cardiovascular Disease

## 2013-09-16 DIAGNOSIS — I701 Atherosclerosis of renal artery: Secondary | ICD-10-CM

## 2013-09-16 NOTE — Progress Notes (Signed)
Renal Duplex Completed. Karlton Maya, BS, RDMS, RVT  

## 2013-09-27 ENCOUNTER — Encounter: Payer: Self-pay | Admitting: *Deleted

## 2013-11-22 ENCOUNTER — Encounter: Payer: Self-pay | Admitting: Family

## 2013-11-23 ENCOUNTER — Encounter: Payer: Self-pay | Admitting: Family

## 2013-11-23 ENCOUNTER — Ambulatory Visit (HOSPITAL_COMMUNITY)
Admission: RE | Admit: 2013-11-23 | Discharge: 2013-11-23 | Disposition: A | Discharge status: Home / Self Care | Payer: Medicare Other | Source: Ambulatory Visit | Attending: Family | Admitting: Family

## 2013-11-23 ENCOUNTER — Ambulatory Visit (INDEPENDENT_AMBULATORY_CARE_PROVIDER_SITE_OTHER): Payer: Medicare Other | Admitting: Family

## 2013-11-23 ENCOUNTER — Ambulatory Visit (INDEPENDENT_AMBULATORY_CARE_PROVIDER_SITE_OTHER)
Admission: RE | Admit: 2013-11-23 | Discharge: 2013-11-23 | Disposition: A | Discharge status: Home / Self Care | Payer: Medicare Other | Source: Ambulatory Visit | Attending: Family | Admitting: Family

## 2013-11-23 VITALS — BP 130/57 | HR 75 | Resp 16 | Ht 66.0 in | Wt 140.0 lb

## 2013-11-23 DIAGNOSIS — I739 Peripheral vascular disease, unspecified: Secondary | ICD-10-CM

## 2013-11-23 DIAGNOSIS — M79609 Pain in unspecified limb: Secondary | ICD-10-CM

## 2013-11-23 DIAGNOSIS — I6529 Occlusion and stenosis of unspecified carotid artery: Secondary | ICD-10-CM

## 2013-11-23 DIAGNOSIS — M79605 Pain in left leg: Secondary | ICD-10-CM | POA: Insufficient documentation

## 2013-11-23 NOTE — Patient Instructions (Signed)
Peripheral Vascular Disease Peripheral Vascular Disease (PVD), also called Peripheral Arterial Disease (PAD), is a circulation problem caused by cholesterol (atherosclerotic plaque) deposits in the arteries. PVD commonly occurs in the lower extremities (legs) but it can occur in other areas of the body, such as your arms. The cholesterol buildup in the arteries reduces blood flow which can cause pain and other serious problems. The presence of PVD can place a person at risk for Coronary Artery Disease (CAD).  CAUSES  Causes of PVD can be many. It is usually associated with more than one risk factor such as:   High Cholesterol.  Smoking.  Diabetes.  Lack of exercise or inactivity.  High blood pressure (hypertension).  Obesity.  Family history. SYMPTOMS   When the lower extremities are affected, patients with PVD may experience:  Leg pain with exertion or physical activity. This is called INTERMITTENT CLAUDICATION. This may present as cramping or numbness with physical activity. The location of the pain is associated with the level of blockage. For example, blockage at the abdominal level (distal abdominal aorta) may result in buttock or hip pain. Lower leg arterial blockage may result in calf pain.  As PVD becomes more severe, pain can develop with less physical activity.  In people with severe PVD, leg pain may occur at rest.  Other PVD signs and symptoms:  Leg numbness or weakness.  Coldness in the affected leg or foot, especially when compared to the other leg.  A change in leg color.  Patients with significant PVD are more prone to ulcers or sores on toes, feet or legs. These may take longer to heal or may reoccur. The ulcers or sores can become infected.  If signs and symptoms of PVD are ignored, gangrene may occur. This can result in the loss of toes or loss of an entire limb.  Not all leg pain is related to PVD. Other medical conditions can cause leg pain such  as:  Blood clots (embolism) or Deep Vein Thrombosis.  Inflammation of the blood vessels (vasculitis).  Spinal stenosis. DIAGNOSIS  Diagnosis of PVD can involve several different types of tests. These can include:  Pulse Volume Recording Method (PVR). This test is simple, painless and does not involve the use of X-rays. PVR involves measuring and comparing the blood pressure in the arms and legs. An ABI (Ankle-Brachial Index) is calculated. The normal ratio of blood pressures is 1. As this number becomes smaller, it indicates more severe disease.  < 0.95 - indicates significant narrowing in one or more leg vessels.  <0.8 - there will usually be pain in the foot, leg or buttock with exercise.  <0.4 - will usually have pain in the legs at rest.  <0.25 - usually indicates limb threatening PVD.  Doppler detection of pulses in the legs. This test is painless and checks to see if you have a pulses in your legs/feet.  A dye or contrast material (a substance that highlights the blood vessels so they show up on x-ray) may be given to help your caregiver better see the arteries for the following tests. The dye is eliminated from your body by the kidney's. Your caregiver may order blood work to check your kidney function and other laboratory values before the following tests are performed:  Magnetic Resonance Angiography (MRA). An MRA is a picture study of the blood vessels and arteries. The MRA machine uses a large magnet to produce images of the blood vessels.  Computed Tomography Angiography (CTA). A CTA   is a specialized x-ray that looks at how the blood flows in your blood vessels. An IV may be inserted into your arm so contrast dye can be injected.  Angiogram. Is a procedure that uses x-rays to look at your blood vessels. This procedure is minimally invasive, meaning a small incision (cut) is made in your groin. A small tube (catheter) is then inserted into the artery of your groin. The catheter  is guided to the blood vessel or artery your caregiver wants to examine. Contrast dye is injected into the catheter. X-rays are then taken of the blood vessel or artery. After the images are obtained, the catheter is taken out. TREATMENT  Treatment of PVD involves many interventions which may include:  Lifestyle changes:  Quitting smoking.  Exercise.  Following a low fat, low cholesterol diet.  Control of diabetes.  Foot care is very important to the PVD patient. Good foot care can help prevent infection.  Medication:  Cholesterol-lowering medicine.  Blood pressure medicine.  Anti-platelet drugs.  Certain medicines may reduce symptoms of Intermittent Claudication.  Interventional/Surgical options:  Angioplasty. An Angioplasty is a procedure that inflates a balloon in the blocked artery. This opens the blocked artery to improve blood flow.  Stent Implant. A wire mesh tube (stent) is placed in the artery. The stent expands and stays in place, allowing the artery to remain open.  Peripheral Bypass Surgery. This is a surgical procedure that reroutes the blood around a blocked artery to help improve blood flow. This type of procedure may be performed if Angioplasty or stent implants are not an option. SEEK IMMEDIATE MEDICAL CARE IF:   You develop pain or numbness in your arms or legs.  Your arm or leg turns cold, becomes blue in color.  You develop redness, warmth, swelling and pain in your arms or legs. MAKE SURE YOU:   Understand these instructions.  Will watch your condition.  Will get help right away if you are not doing well or get worse. Document Released: 06/27/2004 Document Revised: 08/12/2011 Document Reviewed: 05/24/2008 [facility] Patient Information 2015 [place], [place]. This information is not intended to replace advice given to you by your health care provider. Make sure you discuss any questions you have with your health care provider.   Smoking  Cessation Quitting smoking is important to your health and has many advantages. However, it is not always easy to quit since nicotine is a very addictive drug. Often times, people try 3 times or more before being able to quit. This document explains the best ways for you to prepare to quit smoking. Quitting takes hard work and a lot of effort, but you can do it. ADVANTAGES OF QUITTING SMOKING  You will live longer, feel better, and live better.  Your body will feel the impact of quitting smoking almost immediately.  Within 20 minutes, blood pressure decreases. Your pulse returns to its normal level.  After 8 hours, carbon monoxide levels in the blood return to normal. Your oxygen level increases.  After 24 hours, the chance of having a heart attack starts to decrease. Your breath, hair, and body stop smelling like smoke.  After 48 hours, damaged nerve endings begin to recover. Your sense of taste and smell improve.  After 72 hours, the body is virtually free of nicotine. Your bronchial tubes relax and breathing becomes easier.  After 2 to 12 weeks, lungs can hold more air. Exercise becomes easier and circulation improves.  The risk of having a heart attack, stroke,  cancer, or lung disease is greatly reduced.  After 1 year, the risk of coronary heart disease is cut in half.  After 5 years, the risk of stroke falls to the same as a nonsmoker.  After 10 years, the risk of lung cancer is cut in half and the risk of other cancers decreases significantly.  After 15 years, the risk of coronary heart disease drops, usually to the level of a nonsmoker.  If you are pregnant, quitting smoking will improve your chances of having a healthy baby.  The people you live with, especially any children, will be healthier.  You will have extra money to spend on things other than cigarettes. QUESTIONS TO THINK ABOUT BEFORE ATTEMPTING TO QUIT You may want to talk about your answers with your  caregiver.  Why do you want to quit?  If you tried to quit in the past, what helped and what did not?  What will be the most difficult situations for you after you quit? How will you plan to handle them?  Who can help you through the tough times? Your family? Friends? A caregiver?  What pleasures do you get from smoking? What ways can you still get pleasure if you quit? Here are some questions to ask your caregiver:  How can you help me to be successful at quitting?  What medicine do you think would be best for me and how should I take it?  What should I do if I need more help?  What is smoking withdrawal like? How can I get information on withdrawal? GET READY  Set a quit date.  Change your environment by getting rid of all cigarettes, ashtrays, matches, and lighters in your home, car, or work. Do not let people smoke in your home.  Review your past attempts to quit. Think about what worked and what did not. GET SUPPORT AND ENCOURAGEMENT You have a better chance of being successful if you have help. You can get support in many ways.  Tell your family, friends, and co-workers that you are going to quit and need their support. Ask them not to smoke around you.  Get individual, group, or telephone counseling and support. Programs are available at [employer] and health centers. Call your local health department for information about programs in your area.  Spiritual beliefs and practices may help some smokers quit.  Download a "quit meter" on your computer to keep track of quit statistics, such as how long you have gone without smoking, cigarettes not smoked, and money saved.  Get a self-help book about quitting smoking and staying off of tobacco. [place] yourself from urges to smoke. Talk to someone, go for a walk, or occupy your time with a task.  Change your normal routine. Take a different route to work. Drink tea instead of coffee.  Eat breakfast in a different place.  Reduce your stress. Take a hot bath, exercise, or read a book.  Plan something enjoyable to do every day. Reward yourself for not smoking.  Explore interactive web-based programs that specialize in helping you quit. GET MEDICINE AND USE IT CORRECTLY Medicines can help you stop smoking and decrease the urge to smoke. Combining medicine with the above behavioral methods and support can greatly increase your chances of successfully quitting smoking.  Nicotine replacement therapy helps deliver nicotine to your body without the negative effects and risks of smoking. Nicotine replacement therapy includes nicotine gum, lozenges, inhalers, nasal sprays, and skin  patches. Some may be available over-the-counter and others require a prescription.  Antidepressant medicine helps people abstain from smoking, but how this works is unknown. This medicine is available by prescription.  Nicotinic receptor partial agonist medicine simulates the effect of nicotine in your brain. This medicine is available by prescription. Ask your caregiver for advice about which medicines to use and how to use them based on your health history. Your caregiver will tell you what side effects to look out for if you choose to be on a medicine or therapy. Carefully read the information on the package. Do not use any other product containing nicotine while using a nicotine replacement product.  RELAPSE OR DIFFICULT SITUATIONS Most relapses occur within the first 3 months after quitting. Do not be discouraged if you start smoking again. Remember, most people try several times before finally quitting. You may have symptoms of withdrawal because your body is used to nicotine. You may crave cigarettes, be irritable, feel very hungry, cough often, get headaches, or have difficulty concentrating. The withdrawal symptoms are only temporary. They are strongest when you first quit, but they will go away within  10-14 days. To reduce the chances of relapse, try to:  Avoid drinking alcohol. Drinking lowers your chances of successfully quitting.  Reduce the amount of caffeine you consume. Once you quit smoking, the amount of caffeine in your body increases and can give you symptoms, such as a rapid heartbeat, sweating, and anxiety.  Avoid smokers because they can make you want to smoke.  Do not let weight gain distract you. Many smokers will gain weight when they quit, usually less than 10 pounds. Eat a healthy diet and stay active. You can always lose the weight gained after you quit.  Find ways to improve your mood other than smoking. FOR MORE INFORMATION  www.smokefree.gov  Document Released: 05/14/2001 Document Revised: 11/19/2011 Document Reviewed: 08/29/2011 [facility] Patient Information 2015 [place], [place]. This information is not intended to replace advice given to you by your health care provider. Make sure you discuss any questions you have with your health care provider.

## 2013-11-23 NOTE — Progress Notes (Signed)
[facility] [place] HISTORY AND PHYSICAL -PAD  History of Present Illness Brenda [name] is a 63 y.o. female patient of Dr.Early who is s/p right femoral to popliteal bypas graft 06/14/97 and then a revision with extension from prior above knee popliteal anastomosis to below knee popliteal artery with reversed small saphenous vein on 02/05/13. She also has a hx of left femoral to popliteal bypass graft with translocated non-reversed saphenous vein on 06/10/1998.  She returns today for follow up. She has right calf pain after walking about 8 minutes, does not seem worse to her, relieved by rest, denies non healing wounds in legs or feet. She reports rare claudication pain in left leg. She had a stroke in 2000 as manifested by left hemiparesis, expressive aphasia has resolved, has had several TIA's since then with slurred speech and trouble writing with her right hand, the symptoms resolve quickly, last episode was about 2013.  Dr. Wilhemina Cash, her cardiologist, is monitoring her carotid arteries. 07/06/12 CT soft tissue neck: Atherosclerotic calcifications are present at the carotid bifurcations bilaterally without a significant stenosis.  She had an MI in 2005, states mild.  The patient denies New Medical or Surgical History.  Pt Diabetic: Yes, states her last A1C was 8.4, states this is improved, is yet uncontrolled Pt smoker: smoker  (1 ppd, started at age 76 yrs); she has attended smoking cessation classes 3 times, is taking Wellbutrin now which has not decreased her craving, she gets very depressed when not smoking   Pt meds include: Statin :Yes ASA: Yes Other anticoagulants/antiplatelets: Plavix  Past Medical History  Diagnosis Date  . History of DVT (deep vein thrombosis)   . Coronary artery disease     multiple stens  . COPD (chronic obstructive pulmonary disease)   . Transient ischemic attack   . Depression   . Hx of radiation therapy 01/16/09 - 03/06/09    base of  tongue, right neck node  . Peripheral arterial disease     status post bilateral femoropopliteal bypass graft performed by Dr. Sherren Mocha early in the past with significant right lower extremity lifestyle limiting claudication  . Pneumonia   . Hypercholesteremia   . Chronic bronchitis   . Shortness of breath     "w/exertion and sometimes when laying down" (11/26/2012)  . Type II diabetes mellitus   . History of blood transfusion 1955  . GERD (gastroesophageal reflux disease)   . H/O hiatal hernia   . Stroke 2000    left side weakness remains (11/26/2012)  . Arthritis     "hands" (11/26/2012)  . 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 Myocardial infarction 2005    dr Ellyn Hack  with [facility]  . Fatigue 08/23/2013    Social History History  Substance Use Topics  . Smoking status: Light Tobacco Smoker -- 1.00 packs/day for 50 years    Types: Cigarettes  . Smokeless tobacco: Never Used  . Alcohol Use: 6.0 oz/week    10 Glasses of wine per week    Family History Family History  Problem Relation Age of Onset  . Cancer Mother     pancreatic  . Diabetes Mother   . Hyperlipidemia Mother   . Hypertension Mother   . Other Mother     varicose vein  . Cancer Father 72    throat  . Heart disease Father   . Heart attack Father   . Cancer Sister  breast  . Diabetes Sister   . Deep vein thrombosis Brother   . Diabetes Brother   . Hearing loss Brother   . Hyperlipidemia Brother   . Hypertension Brother   . Heart attack Brother   . Clotting disorder Brother   . Diabetes Son   . Hyperlipidemia Son     Past Surgical History  Procedure Laterality Date  . Tee without cardioversion  07/26/2011    Procedure: TRANSESOPHAGEAL ECHOCARDIOGRAM (TEE);  Surgeon: Pixie Casino, MD;  Location: [facility] ENDOSCOPY;  Service: Cardiovascular;  Laterality: N/A;  . Esophagogastroduodenoscopy  12/20/2011    Procedure: ESOPHAGOGASTRODUODENOSCOPY (EGD);  Surgeon: Beryle Beams, MD;  Location: [facility] ENDOSCOPY;  Service: Endoscopy;  Laterality: N/A;  EGD with balloon dilation  . Savory dilation  12/20/2011    Procedure: SAVORY DILATION;  Surgeon: Beryle Beams, MD;  Location: [facility] ENDOSCOPY;  Service: Endoscopy;  Laterality: N/A;  . Tonsillectomy  1969  . Appendectomy  1962  . Cardiac catheterization  1999; 11/26/2012  . Coronary angioplasty with stent placement  1999--2005    "i think I have 3-5 total" (11/26/2012)  . Femoral bypass  1999; 2000    Archie Endo 11/26/2012  . Carpal tunnel release Right 1980's?  . Vaginal hysterectomy  1988  . Cesarean section  1978  . Tubal ligation  1988  . Cataract extraction w/ intraocular lens implant Right 2011  . Ankle fracture surgery Right 2007    "got a metal plate and 8 screws in it" (11/26/2012)  . Bypass graft popliteal to popliteal Right 02/05/2013    Procedure: BYPASS GRAFT ABOVE KNEE POPLITEAL TO BELOW KNEE POPLITEAL WITH SMALL SAPHANOUS VEIN;  Surgeon: Rosetta Posner, MD;  Location: [facility];  Service: Vascular;  Laterality: Right;    Allergies  Allergen Reactions  . Tape Hives and Other (See Comments)    USE PAPER TAPE ONLY- Adhesive peels off skin-makes pt. Raw.  Maryjo Rochester [Nicotine] Rash    To the PATCH only, breakouts on skin    Current Outpatient Prescriptions  Medication Sig Dispense Refill  . acetaminophen (TYLENOL) 500 MG tablet Take 1,000 mg by mouth every 6 (six) hours as needed for pain.      Marland Kitchen aspirin EC 81 MG tablet Take 81 mg by mouth daily.      Marland Kitchen buPROPion (WELLBUTRIN XL) 150 MG 24 hr tablet Take 150 mg by mouth daily.      . calcium carbonate, dosed in mg elemental calcium, 1250 MG/5ML Take 500 mg of elemental calcium by mouth daily with breakfast.      . clopidogrel (PLAVIX) 75 MG tablet Take 75 mg by mouth daily.       Marland Kitchen docusate sodium (COLACE) 100 MG capsule Take 100 mg by mouth daily.      . Fiber CAPS Take 2 capsules by mouth daily.       . fish oil-omega-3 fatty acids 1000 MG capsule Take 1 g by  mouth 2 (two) times daily.       . Fluticasone Furoate-Vilanterol (BREO ELLIPTA IN) Inhale 1 puff into the lungs daily.      . insulin glargine (LANTUS) 100 UNIT/ML injection Inject 16-20 Units into the skin 2 (two) times daily. 16 units up to cbg of 250.  20 units if cbg is higher than 250      . insulin glulisine (APIDRA) 100 UNIT/ML injection Inject 4-8 Units into the skin 4 (four) times daily as needed for high blood sugar. For blood  sugar levels      . Multiple Vitamin (MULTIVITAMIN) tablet Take 1 tablet by mouth daily.      . nitroGLYCERIN (NITROLINGUAL) 0.4 MG/SPRAY spray Place 1 spray under the tongue every 5 (five) minutes as needed for chest pain.      Marland Kitchen omeprazole (PRILOSEC) 20 MG capsule Take 20 mg by mouth daily.       . ondansetron (ZOFRAN-ODT) 4 MG disintegrating tablet Take 4 mg by mouth every 8 (eight) hours as needed for nausea.      . pilocarpine (SALAGEN) 5 MG tablet Take 5 mg by mouth 3 (three) times daily.      . rosuvastatin (CRESTOR) 20 MG tablet Take 20 mg by mouth daily.       No current facility-administered medications for this visit.    ROS: See HPI for pertinent positives and negatives.   Physical Examination  Filed Vitals:   11/23/13 1326  BP: 130/57  Pulse: 75  Resp: 16   Filed Vitals:   11/23/13 1326  BP: 130/57  Pulse: 75  Resp: 16  Height: 5\' 6"  (1.676 m)  Weight: 140 lb (63.504 kg)  SpO2: 100%   Body mass index is 22.61 kg/(m^2).  General: A&O x 3, WDWN. Gait: normal Eyes: PERRLA. Pulmonary: CTAB with decreased air movement in all fields, without wheezes , rales or rhonchi. Cardiac: regular Rythm , without detected murmur.         Carotid Bruits Left Right   Negative Negative  Aorta is not palpable. Radial pulses: are 2+ palpable and =                           VASCULAR EXAM: Extremities without ischemic changes  without Gangrene; without open wounds.                                                                                                           LE Pulses LEFT RIGHT       FEMORAL  2+ palpable  2+ palpable        POPLITEAL  2+ palpable   not palpable       POSTERIOR TIBIAL  not palpable   1+ palpable        DORSALIS PEDIS      ANTERIOR TIBIAL not palpable  2+ palpable    Abdomen: soft, NT, no masses. Skin: no rashes, no ulcers noted. Musculoskeletal: no muscle wasting or atrophy.  Neurologic: A&O X 3; Appropriate Affect ; SENSATION: normal; MOTOR FUNCTION:  moving all extremities equally, motor strength 5/5 throughout. Speech is fluent/normal. CN 2-12 intact.    Non-Invasive Vascular Imaging: DATE: 11/23/2013 LOWER EXTREMITY ARTERIAL DUPLEX EVALUATION    INDICATION: Follow up bypass graft     PREVIOUS INTERVENTION(S): Right femoral to above knee popliteal artery bypass graft in 1999 with revision/extention of right distal anastomosis on 02/05/13; Left femoral to below knee artery popliteal artery bypass graft in 2000    DUPLEX EXAM:     RIGHT  LEFT   Peak  Systolic Velocity (cm/s) Ratio (if abnormal) Waveform  Peak Systolic Velocity (cm/s) Ratio (if abnormal) Waveform  127  B Inflow Artery 154  T  110  B Proximal Anastomosis 121  M  39  B Proximal Graft 45  B  33  B Mid Graft 46  B  39  B  Distal Graft 37  B  34  B Distal Anastomosis 68  B  24/105 4.4 M Outflow Artery 110  B  0.59 Today's ABI / TBI 1.07  0.75 Previous ABI / TBI (05/25/13 ) 1.26    Waveform:    M - Monophasic       B - Biphasic       T - Triphasic  If Ankle Brachial Index (ABI) or Toe Brachial Index (TBI) performed, please see complete report     ADDITIONAL FINDINGS:   Non-hemodynamically significant partially-occlusive plaque formations noted in the bilateral common femoral arteries, bilateral proximal anastomoses and left popliteal artery.    Partially-occlusive plaque formation noted in the right distal popliteal artery, near the tibioperoneal trunk, with velocity ratios suggestive of a significant stenosis.      IMPRESSION: 1. Patent bilateral leg bypass graft with no evidence of internal stenosis noted. 2. Velocity ratio of the right distal popliteal artery suggests a possible greater than 50% stenosis. 3. Comparison to the previous exam is noted on the second page of this report.     Compared to the previous exam:  No significant change in the bilateral bypass graft velocities when compared to the exam on 05/25/13 with a focal increase in the right distal popliteal artery now noted.    ASSESSMENT: AKAYSHA COBERN is a 63 y.o. female who is s/p right femoral to popliteal bypas graft 06/14/97 and then a revision with extension from prior above knee popliteal anastomosis to below knee popliteal artery with reversed small saphenous vein on 02/05/13. She also has a hx of left femoral to popliteal bypass graft with translocated non-reversed saphenous vein on 06/10/1998.  She has moderate claudication in her right calf. Patent bilateral leg bypass graft with no evidence of internal stenosis noted. Velocity ratio of the right distal popliteal artery suggests a possible greater than 50% stenosis.  No significant change in the bilateral bypass graft velocities when compared to the exam on 05/25/13 with a focal increase in the right distal popliteal artery now noted.  She had a stroke in 2000 as manifested by left hemiparesis, expressive aphasia has resolved, has had several TIA's since then with slurred speech and trouble writing with her right hand, the symptoms resolve quickly, last episode was about 2013.  Pt requested that this practice monitor her carotid arteries. Will check carotid Duplex when she returns in 6 months.  PLAN:  She was counseled re smoking cessation. I discussed in depth with the patient the nature of atherosclerosis, and emphasized the importance of maximal medical management including strict control of blood pressure, blood glucose, and lipid levels, obtaining regular exercise, and cessation  of smoking.  The patient is aware that without maximal medical management the underlying atherosclerotic disease process will progress, limiting the benefit of any interventions.  Based on the patient's vascular studies and examination, and after discussing with Dr Donnetta Hutching, pt will return to clinic in 6 months for carotid Duplex, bilateral LE arterial Duplex, and ABI's.  The patient was given information about PAD including signs, symptoms, treatment, what symptoms should prompt the patient to seek immediate medical care, and risk reduction measures  to take.  Clemon Chambers, RN, MSN, FNP-C [facility] Phone: [phone]  Clinic MD: Early  11/23/2013 1:40 PM

## 2013-11-24 NOTE — Addendum Note (Signed)
Addended by: Mena Goes on: 11/24/2013 09:08 AM   Modules accepted: Orders

## 2013-12-20 ENCOUNTER — Other Ambulatory Visit: Payer: Self-pay

## 2013-12-20 DIAGNOSIS — Z1231 Encounter for screening mammogram for malignant neoplasm of breast: Secondary | ICD-10-CM

## 2013-12-29 ENCOUNTER — Ambulatory Visit: Payer: Medicare Other

## 2014-06-06 DIAGNOSIS — M25571 Pain in right ankle and joints of right foot: Secondary | ICD-10-CM | POA: Diagnosis not present

## 2014-06-07 ENCOUNTER — Encounter: Payer: Self-pay | Admitting: Family

## 2014-06-08 ENCOUNTER — Ambulatory Visit (INDEPENDENT_AMBULATORY_CARE_PROVIDER_SITE_OTHER): Payer: Medicare Other | Admitting: Family

## 2014-06-08 ENCOUNTER — Encounter: Payer: Self-pay | Admitting: Family

## 2014-06-08 ENCOUNTER — Ambulatory Visit (INDEPENDENT_AMBULATORY_CARE_PROVIDER_SITE_OTHER)
Admission: RE | Admit: 2014-06-08 | Discharge: 2014-06-08 | Disposition: A | Discharge status: Home / Self Care | Payer: Medicare Other | Source: Ambulatory Visit | Attending: Family | Admitting: Family

## 2014-06-08 ENCOUNTER — Ambulatory Visit (HOSPITAL_COMMUNITY)
Admission: RE | Admit: 2014-06-08 | Discharge: 2014-06-08 | Disposition: A | Discharge status: Home / Self Care | Payer: Medicare Other | Source: Ambulatory Visit | Attending: Family | Admitting: Family

## 2014-06-08 ENCOUNTER — Other Ambulatory Visit: Payer: Self-pay | Admitting: Family

## 2014-06-08 VITALS — BP 111/72 | HR 74 | Resp 16 | Ht 66.0 in | Wt 133.0 lb

## 2014-06-08 DIAGNOSIS — I6523 Occlusion and stenosis of bilateral carotid arteries: Secondary | ICD-10-CM | POA: Insufficient documentation

## 2014-06-08 DIAGNOSIS — I739 Peripheral vascular disease, unspecified: Secondary | ICD-10-CM

## 2014-06-08 DIAGNOSIS — F17200 Nicotine dependence, unspecified, uncomplicated: Secondary | ICD-10-CM

## 2014-06-08 DIAGNOSIS — I70219 Atherosclerosis of native arteries of extremities with intermittent claudication, unspecified extremity: Secondary | ICD-10-CM

## 2014-06-08 DIAGNOSIS — Z720 Tobacco use: Secondary | ICD-10-CM

## 2014-06-08 DIAGNOSIS — Z48812 Encounter for surgical aftercare following surgery on the circulatory system: Secondary | ICD-10-CM

## 2014-06-08 NOTE — Progress Notes (Signed)
[facility] [place] HISTORY AND PHYSICAL   MRN : [id number]  History of Present Illness:   Brenda [name] is a 64 y.o. female patient of Dr.Early who is s/p right femoral to popliteal bypas graft 06/14/97 and then a revision with extension from prior above knee popliteal anastomosis to below knee popliteal artery with reversed small saphenous vein on 02/05/13. She also has a hx of left femoral to popliteal bypass graft with translocated non-reversed saphenous vein on 06/10/1998.  She returns today for follow up. She has right calf pain after walking about 8 minutes, seems worse to her, relieved by rest, denies non healing wounds in legs or feet. She has tingling and numbness in both feet at times, worse in the right foot. She reports rare claudication pain in left leg. She had a stroke in 2000 as manifested by left hemiparesis, expressive aphasia has resolved, has had several TIA's since then with slurred speech and trouble writing with her right hand, the symptoms resolve quickly, last episode was about 2013.  Dr. Wilhemina Cash, her cardiologist, was monitoring her carotid arteries. 07/06/12 CT soft tissue neck: Atherosclerotic calcifications are present at the carotid bifurcations bilaterally without a significant stenosis.  She had an MI in 2005, states mild.  The patient denies New Medical or Surgical History.  Pt Diabetic: Yes, states her last A1C was 7.6 states this is improved, is yet uncontrolled Pt smoker: smoker (1 ppd, started at age 31 yrs); she has attended smoking cessation classes 3 times, is taking Wellbutrin now which has not decreased her craving, she gets very depressed when not smoking   Pt meds include: Statin :Yes ASA: Yes Other anticoagulants/antiplatelets: Plavix   Current Outpatient Prescriptions  Medication Sig Dispense Refill  . acetaminophen (TYLENOL) 500 MG tablet Take 1,000 mg by mouth every 6 (six) hours as needed for pain.    Marland Kitchen aspirin EC 81  MG tablet Take 81 mg by mouth daily.    Marland Kitchen buPROPion (WELLBUTRIN XL) 150 MG 24 hr tablet Take 150 mg by mouth daily.    . calcium carbonate, dosed in mg elemental calcium, 1250 MG/5ML Take 500 mg of elemental calcium by mouth daily with breakfast.    . clopidogrel (PLAVIX) 75 MG tablet Take 75 mg by mouth daily.     Marland Kitchen docusate sodium (COLACE) 100 MG capsule Take 100 mg by mouth daily.    . Fiber CAPS Take 2 capsules by mouth daily.     . fish oil-omega-3 fatty acids 1000 MG capsule Take 1 g by mouth 2 (two) times daily.     . Fluticasone Furoate-Vilanterol (BREO ELLIPTA IN) Inhale 1 puff into the lungs daily.    . insulin glargine (LANTUS) 100 UNIT/ML injection Inject 16-20 Units into the skin 2 (two) times daily. 16 units up to cbg of 250.  20 units if cbg is higher than 250    . insulin glulisine (APIDRA) 100 UNIT/ML injection Inject 4-8 Units into the skin 4 (four) times daily as needed for high blood sugar. For blood sugar levels    . Multiple Vitamin (MULTIVITAMIN) tablet Take 1 tablet by mouth daily.    . nitroGLYCERIN (NITROLINGUAL) 0.4 MG/SPRAY spray Place 1 spray under the tongue every 5 (five) minutes as needed for chest pain.    Marland Kitchen omeprazole (PRILOSEC) 20 MG capsule Take 20 mg by mouth daily.     . ondansetron (ZOFRAN-ODT) 4 MG disintegrating tablet Take 4 mg by mouth every 8 (eight) hours as  needed for nausea.    . pilocarpine (SALAGEN) 5 MG tablet Take 5 mg by mouth 3 (three) times daily.    . rosuvastatin (CRESTOR) 20 MG tablet Take 20 mg by mouth daily.     No current facility-administered medications for this visit.    Past Medical History  Diagnosis Date  . History of DVT (deep vein thrombosis)   . Coronary artery disease     multiple stens  . COPD (chronic obstructive pulmonary disease)   . Transient ischemic attack   . Depression   . Hx of radiation therapy 01/16/09 - 03/06/09    base of tongue, right neck node  . Peripheral arterial disease     status post bilateral  femoropopliteal bypass graft performed by Dr. Sherren Mocha early in the past with significant right lower extremity lifestyle limiting claudication  . Pneumonia   . Hypercholesteremia   . Chronic bronchitis   . Shortness of breath     "w/exertion and sometimes when laying down" (11/26/2012)  . Type II diabetes mellitus   . History of blood transfusion 1955  . GERD (gastroesophageal reflux disease)   . H/O hiatal hernia   . Stroke 2000    left side weakness remains (11/26/2012)  . Arthritis     "hands" (11/26/2012)  . 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 Myocardial infarction 2005    dr Ellyn Hack  with [facility]  . Fatigue 08/23/2013  . Carotid artery occlusion     Social History History  Substance Use Topics  . Smoking status: Current Every Day Smoker -- 1.00 packs/day for 50 years    Types: Cigarettes  . Smokeless tobacco: Never Used  . Alcohol Use: 6.0 oz/week    10 Glasses of wine per week    Family History Family History  Problem Relation Age of Onset  . Cancer Mother     pancreatic  . Diabetes Mother   . Hyperlipidemia Mother   . Hypertension Mother   . Other Mother     varicose vein  . Cancer Father 72    throat  . Heart disease Father   . Heart attack Father   . Cancer Sister     breast  . Diabetes Sister   . Deep vein thrombosis Brother   . Diabetes Brother   . Hearing loss Brother   . Hyperlipidemia Brother   . Hypertension Brother   . Heart attack Brother   . Clotting disorder Brother   . Diabetes Son   . Hyperlipidemia Son     Surgical History Past Surgical History  Procedure Laterality Date  . Tee without cardioversion  07/26/2011    Procedure: TRANSESOPHAGEAL ECHOCARDIOGRAM (TEE);  Surgeon: Pixie Casino, MD;  Location[facility] ENDOSCOPY;  Service: Cardiovascular;  Laterality: N/A;  . Esophagogastroduodenoscopy  12/20/2011    Procedure: ESOPHAGOGASTRODUODENOSCOPY (EGD);  Surgeon: Beryle Beams, MD;  Location: [facility]  ENDOSCOPY;  Service: Endoscopy;  Laterality: N/A;  EGD with balloon dilation  . Savory dilation  12/20/2011    Procedure: SAVORY DILATION;  Surgeon: Beryle Beams, MD;  Location: [facility] ENDOSCOPY;  Service: Endoscopy;  Laterality: N/A;  . Tonsillectomy  1969  . Appendectomy  1962  . Cardiac catheterization  1999; 11/26/2012  . Coronary angioplasty with stent placement  1999--2005    "i think I have 3-5 total" (11/26/2012)  . Femoral bypass  1999; 2000    Archie Endo 11/26/2012  . Carpal tunnel release  Right 1980's?  . Vaginal hysterectomy  1988  . Cesarean section  1978  . Tubal ligation  1988  . Cataract extraction w/ intraocular lens implant Right 2011  . Ankle fracture surgery Right 2007    "got a metal plate and 8 screws in it" (11/26/2012)  . Bypass graft popliteal to popliteal Right 02/05/2013    Procedure: BYPASS GRAFT ABOVE KNEE POPLITEAL TO BELOW KNEE POPLITEAL WITH SMALL SAPHANOUS VEIN;  Surgeon: Rosetta Posner, MD;  Location: [facility] OR;  Service: Vascular;  Laterality: Right;  . Eye surgery      Allergies  Allergen Reactions  . Tape Hives and Other (See Comments)    USE PAPER TAPE ONLY- Adhesive peels off skin-makes pt. Raw.  Maryjo Rochester [Nicotine] Rash    To the PATCH only, breakouts on skin    Current Outpatient Prescriptions  Medication Sig Dispense Refill  . acetaminophen (TYLENOL) 500 MG tablet Take 1,000 mg by mouth every 6 (six) hours as needed for pain.    Marland Kitchen aspirin EC 81 MG tablet Take 81 mg by mouth daily.    Marland Kitchen buPROPion (WELLBUTRIN XL) 150 MG 24 hr tablet Take 150 mg by mouth daily.    . calcium carbonate, dosed in mg elemental calcium, 1250 MG/5ML Take 500 mg of elemental calcium by mouth daily with breakfast.    . clopidogrel (PLAVIX) 75 MG tablet Take 75 mg by mouth daily.     Marland Kitchen docusate sodium (COLACE) 100 MG capsule Take 100 mg by mouth daily.    . Fiber CAPS Take 2 capsules by mouth daily.     . fish oil-omega-3 fatty acids 1000 MG capsule Take 1 g by mouth 2 (two) times  daily.     . Fluticasone Furoate-Vilanterol (BREO ELLIPTA IN) Inhale 1 puff into the lungs daily.    . insulin glargine (LANTUS) 100 UNIT/ML injection Inject 16-20 Units into the skin 2 (two) times daily. 16 units up to cbg of 250.  20 units if cbg is higher than 250    . insulin glulisine (APIDRA) 100 UNIT/ML injection Inject 4-8 Units into the skin 4 (four) times daily as needed for high blood sugar. For blood sugar levels    . Multiple Vitamin (MULTIVITAMIN) tablet Take 1 tablet by mouth daily.    . nitroGLYCERIN (NITROLINGUAL) 0.4 MG/SPRAY spray Place 1 spray under the tongue every 5 (five) minutes as needed for chest pain.    Marland Kitchen omeprazole (PRILOSEC) 20 MG capsule Take 20 mg by mouth daily.     . ondansetron (ZOFRAN-ODT) 4 MG disintegrating tablet Take 4 mg by mouth every 8 (eight) hours as needed for nausea.    . pilocarpine (SALAGEN) 5 MG tablet Take 5 mg by mouth 3 (three) times daily.    . rosuvastatin (CRESTOR) 20 MG tablet Take 20 mg by mouth daily.     No current facility-administered medications for this visit.     REVIEW OF SYSTEMS: See HPI for pertinent positives and negatives. Pt denies chest pain, denies dyspnea, denies fever or chills.  Physical Examination Filed Vitals:   06/08/14 1546 06/08/14 1549  BP: 110/64 111/72  Pulse: 69 74  Resp:  16  Height:  5\' 6"  (1.676 m)  Weight:  133 lb (60.328 kg)  SpO2:  98%   Body mass index is 21.48 kg/(m^2).  General: A&O x 3, WDWN. Gait: normal Eyes: PERRLA. Pulmonary: CTAB with decreased air movement in all fields, without wheezes , rales or rhonchi. Cardiac: regular Rythm ,  without detected murmur.     Carotid Bruits Left Right   Negative Negative  Aorta is not palpable. Radial pulses: 2+ palpable right, 1+ palpable left   VASCULAR EXAM: Extremities without ischemic changes  without Gangrene; without open wounds.      LE Pulses LEFT RIGHT   FEMORAL 2+ palpable 2+ palpable    POPLITEAL not palpable  not palpable   POSTERIOR TIBIAL not palpable  not palpable    DORSALIS PEDIS  ANTERIOR TIBIAL 1+ palpable  not palpable    Abdomen: soft, NT, no palpable masses. Skin: no rashes, no ulcers noted. Musculoskeletal: no muscle wasting or atrophy. Moderate kyphosis. Neurologic: A&O X 3; Appropriate Affect ; SENSATION: normal; MOTOR FUNCTION: moving all extremities equally, motor strength 5/5 throughout. Speech is fluent/normal. CN 2-12 intact.   Non-Invasive Vascular Imaging (06/08/2014):   CEREBROVASCULAR DUPLEX EVALUATION    INDICATION: Carotid artery stenosis    PREVIOUS INTERVENTION(S): NA    DUPLEX EXAM:     RIGHT  LEFT  Peak Systolic Velocities (cm/s) End Diastolic Velocities (cm/s) Plaque LOCATION Peak Systolic Velocities (cm/s) End Diastolic Velocities (cm/s) Plaque  108 18  CCA PROXIMAL 126 30   87 19  CCA MID 98 22   62 16 HT CCA DISTAL 82 26 HT  109 13 HT ECA 126 9 HT  75 24 HT ICA PROXIMAL 106 38 HT  77 25  ICA MID 124 34   58 18  ICA DISTAL 96 29     0.86 ICA / CCA Ratio (PSV) 1.08  Antegrade Vertebral Flow Antegrade  793 Brachial Systolic Pressure (mmHg) 903  Triphasic Brachial Artery Waveforms Triphasic    Plaque Morphology:  HM = Homogeneous, HT = Heterogeneous, CP = Calcific Plaque, SP = Smooth Plaque, IP = Irregular Plaque     ADDITIONAL FINDINGS:     IMPRESSION: Bilateral internal carotid artery stenosis present of less than 40%.    Compared to the previous exam:  Essentially unchanged when compared to previous outside study performed at [facility] 07/25/2011.    LOWER EXTREMITY ARTERIAL DUPLEX EVALUATION    INDICATION: Peripheral vascular disease     PREVIOUS INTERVENTION(S): Right femoral popliteal bypass graft 1999 with  revision/extension distal anastomosis 02/05/2013; Left femoral popliteal bypass graft 2000.    DUPLEX EXAM:     RIGHT  LEFT   Peak Systolic Velocity (cm/s) Ratio (if abnormal) Waveform  Peak Systolic Velocity (cm/s) Ratio (if abnormal) Waveform  86  B Inflow Artery 160  B  67/106 1.5 B/B Proximal Anastomosis 104  B  27 3.9 M Proximal Graft 88  B  29  M Mid Graft 53  B  30  M  Distal Graft 55  B  22  M Distal Anastomosis 47/68  B/B  33/0  M/ occluded Outflow Artery 127/141/112  B/B/B  0.45/0 Today's ABI / TBI 1.06/0.44  0.59/0.35 Previous ABI / TBI (11/23/2013 ) 1.07/0.56    Waveform:    M - Monophasic       B - Biphasic       T - Triphasic  If Ankle Brachial Index (ABI) or Toe Brachial Index (TBI) performed, please see complete report  ADDITIONAL  FINDINGS:     IMPRESSION: Right proximal anastomotic stenosis present of greater than 70% of the femoral-popliteal artery bypass graft. No color or spectral Doppler waveforms could be obtained involving the right arterial outflow at the distal popliteal /tibioperoneal trunk arteries suggestive of vessel occlusion. Bilateral dampened phasicity present involving the arterial  inflow at the groin suggestive of a more proximal disease process. Patent left femoral popliteal artery bypass graft present, no hemodynamically significant plaque or thrombus visualized.    Compared to the previous exam:  Increase in disease on the right and stable on the left since study on 11/23/2013.     ASSESSMENT:  Brenda [name] is a 64 y.o. female who is s/p right femoral to popliteal bypas graft 06/14/97 and then a revision with extension from prior above knee popliteal anastomosis to below knee popliteal artery with reversed small saphenous vein on 02/05/13. She also has a hx of left femoral to popliteal bypass graft with translocated non-reversed saphenous vein on 06/10/1998.  She has right calf pain after walking about 8 minutes, seems worse to her, relieved by  rest, denies non healing wounds in legs or feet. She has tingling and numbness in both feet at times, worse in the right foot. She reports rare claudication pain in left leg. She had a stroke in 2000 as manifested by left hemiparesis, expressive aphasia has resolved, has had several TIA's since then with slurred speech and trouble writing with her right hand, the symptoms resolve quickly, last episode was about 2013.   Today's LE arterial Duplex reveals right proximal anastomotic stenosis greater than 70% of the femoral-popliteal artery bypass graft. No color or spectral Doppler waveforms could be obtained involving the right arterial outflow at the distal popliteal /tibioperoneal trunk arteries suggestive of vessel occlusion. Bilateral dampened phasicity present involving the arterial inflow at the groin suggestive of a more proximal disease process. Patent left femoral popliteal artery bypass graft present, no hemodynamically significant plaque or thrombus visualized. Increase in disease on the right and stable on the left since study on 11/23/2013. Her right calf claudication is moderate to severe, is able to complete her ADL's, no tissue loss.  Today's carotid Duplex reveals bilateral internal carotid artery stenosis present of less than 40%, essentially unchanged when compared to previous outside study performed at [facility] 07/25/2011.  Pt states on the 150 mg bupropion xl one tablet daily she still needs to smoke to avoid severe depression; consider increasing the dose to 300 mg daily, will defer to her PCP. Her DM control has improved compared to her previous visits and is almost in control. Unfortunately the atherosclerotic disease has progressed in her right leg which corresponds to her claudication symptoms.  PLAN:   Based on today's exam and non-invasive vascular lab results, and after discussing with Dr. Scot Dock, the patient will follow up in 3 months with the following tests: ABI's and  bilateral LE arterial Duplex and follow up with Dr. Donnetta Hutching. Graduated walking program. Maximal medical management of atherosclerotic risk factors. The patient was counseled re smoking cessation and given several free resources re smoking cessation.  I discussed in depth with the patient the nature of atherosclerosis, and emphasized the importance of maximal medical management including strict control of blood pressure, blood glucose, and lipid levels, obtaining regular exercise, and cessation of smoking.  The patient is aware that without maximal medical management the underlying atherosclerotic disease process will progress, limiting the benefit of any interventions.  The patient was given information about stroke prevention and what symptoms should prompt the patient to seek immediate medical care.  The patient was given information about PAD including signs, symptoms, treatment, what symptoms should prompt the patient to seek immediate medical care, and risk reduction measures to take. Thank you for allowing [place] to participate in this patient's care.  Clemon Chambers, RN, MSN, FNP-C [facility] Office: [phone]  Clinic MD: Scot Dock 06/08/2014 4:02 PM

## 2014-06-08 NOTE — Patient Instructions (Signed)
Peripheral Vascular Disease Peripheral Vascular Disease (PVD), also called Peripheral Arterial Disease (PAD), is a circulation problem caused by cholesterol (atherosclerotic plaque) deposits in the arteries. PVD commonly occurs in the lower extremities (legs) but it can occur in other areas of the body, such as your arms. The cholesterol buildup in the arteries reduces blood flow which can cause pain and other serious problems. The presence of PVD can place a person at risk for Coronary Artery Disease (CAD).  CAUSES  Causes of PVD can be many. It is usually associated with more than one risk factor such as:   High Cholesterol.  Smoking.  Diabetes.  Lack of exercise or inactivity.  High blood pressure (hypertension).  Obesity.  Family history. SYMPTOMS   When the lower extremities are affected, patients with PVD may experience:  Leg pain with exertion or physical activity. This is called INTERMITTENT CLAUDICATION. This may present as cramping or numbness with physical activity. The location of the pain is associated with the level of blockage. For example, blockage at the abdominal level (distal abdominal aorta) may result in buttock or hip pain. Lower leg arterial blockage may result in calf pain.  As PVD becomes more severe, pain can develop with less physical activity.  In people with severe PVD, leg pain may occur at rest.  Other PVD signs and symptoms:  Leg numbness or weakness.  Coldness in the affected leg or foot, especially when compared to the other leg.  A change in leg color.  Patients with significant PVD are more prone to ulcers or sores on toes, feet or legs. These may take longer to heal or may reoccur. The ulcers or sores can become infected.  If signs and symptoms of PVD are ignored, gangrene may occur. This can result in the loss of toes or loss of an entire limb.  Not all leg pain is related to PVD. Other medical conditions can cause leg pain such  as:  Blood clots (embolism) or Deep Vein Thrombosis.  Inflammation of the blood vessels (vasculitis).  Spinal stenosis. DIAGNOSIS  Diagnosis of PVD can involve several different types of tests. These can include:  Pulse Volume Recording Method (PVR). This test is simple, painless and does not involve the use of X-rays. PVR involves measuring and comparing the blood pressure in the arms and legs. An ABI (Ankle-Brachial Index) is calculated. The normal ratio of blood pressures is 1. As this number becomes smaller, it indicates more severe disease.  < 0.95 - indicates significant narrowing in one or more leg vessels.  <0.8 - there will usually be pain in the foot, leg or buttock with exercise.  <0.4 - will usually have pain in the legs at rest.  <0.25 - usually indicates limb threatening PVD.  Doppler detection of pulses in the legs. This test is painless and checks to see if you have a pulses in your legs/feet.  A dye or contrast material (a substance that highlights the blood vessels so they show up on x-ray) may be given to help your caregiver better see the arteries for the following tests. The dye is eliminated from your body by the kidney's. Your caregiver may order blood work to check your kidney function and other laboratory values before the following tests are performed:  Magnetic Resonance Angiography (MRA). An MRA is a picture study of the blood vessels and arteries. The MRA machine uses a large magnet to produce images of the blood vessels.  Computed Tomography Angiography (CTA). A CTA   is a specialized x-ray that looks at how the blood flows in your blood vessels. An IV may be inserted into your arm so contrast dye can be injected.  Angiogram. Is a procedure that uses x-rays to look at your blood vessels. This procedure is minimally invasive, meaning a small incision (cut) is made in your groin. A small tube (catheter) is then inserted into the artery of your groin. The catheter  is guided to the blood vessel or artery your caregiver wants to examine. Contrast dye is injected into the catheter. X-rays are then taken of the blood vessel or artery. After the images are obtained, the catheter is taken out. TREATMENT  Treatment of PVD involves many interventions which may include:  Lifestyle changes:  Quitting smoking.  Exercise.  Following a low fat, low cholesterol diet.  Control of diabetes.  Foot care is very important to the PVD patient. Good foot care can help prevent infection.  Medication:  Cholesterol-lowering medicine.  Blood pressure medicine.  Anti-platelet drugs.  Certain medicines may reduce symptoms of Intermittent Claudication.  Interventional/Surgical options:  Angioplasty. An Angioplasty is a procedure that inflates a balloon in the blocked artery. This opens the blocked artery to improve blood flow.  Stent Implant. A wire mesh tube (stent) is placed in the artery. The stent expands and stays in place, allowing the artery to remain open.  Peripheral Bypass Surgery. This is a surgical procedure that reroutes the blood around a blocked artery to help improve blood flow. This type of procedure may be performed if Angioplasty or stent implants are not an option. SEEK IMMEDIATE MEDICAL CARE IF:   You develop pain or numbness in your arms or legs.  Your arm or leg turns cold, becomes blue in color.  You develop redness, warmth, swelling and pain in your arms or legs. MAKE SURE YOU:   Understand these instructions.  Will watch your condition.  Will get help right away if you are not doing well or get worse. Document Released: 06/27/2004 Document Revised: 08/12/2011 Document Reviewed: 05/24/2008 [employer] Patient Information 2015 [employer]. This information is not intended to replace advice given to you by your health care provider. Make sure you discuss any questions you have with your health care provider.    Smoking  Cessation Quitting smoking is important to your health and has many advantages. However, it is not always easy to quit since nicotine is a very addictive drug. Oftentimes, people try 3 times or more before being able to quit. This document explains the best ways for you to prepare to quit smoking. Quitting takes hard work and a lot of effort, but you can do it. ADVANTAGES OF QUITTING SMOKING  You will live longer, feel better, and live better.  Your body will feel the impact of quitting smoking almost immediately.  Within 20 minutes, blood pressure decreases. Your pulse returns to its normal level.  After 8 hours, carbon monoxide levels in the blood return to normal. Your oxygen level increases.  After 24 hours, the chance of having a heart attack starts to decrease. Your breath, hair, and body stop smelling like smoke.  After 48 hours, damaged nerve endings begin to recover. Your sense of taste and smell improve.  After 72 hours, the body is virtually free of nicotine. Your bronchial tubes relax and breathing becomes easier.  After 2 to 12 weeks, lungs can hold more air. Exercise becomes easier and circulation improves.  The risk of having a heart attack, stroke,   cancer, or lung disease is greatly reduced.  After 1 year, the risk of coronary heart disease is cut in half.  After 5 years, the risk of stroke falls to the same as a nonsmoker.  After 10 years, the risk of lung cancer is cut in half and the risk of other cancers decreases significantly.  After 15 years, the risk of coronary heart disease drops, usually to the level of a nonsmoker.  If you are pregnant, quitting smoking will improve your chances of having a healthy baby.  The people you live with, especially any children, will be healthier.  You will have extra money to spend on things other than cigarettes. QUESTIONS TO THINK ABOUT BEFORE ATTEMPTING TO QUIT You may want to talk about your answers with your health care  provider.  Why do you want to quit?  If you tried to quit in the past, what helped and what did not?  What will be the most difficult situations for you after you quit? How will you plan to handle them?  Who can help you through the tough times? Your family? Friends? A health care provider?  What pleasures do you get from smoking? What ways can you still get pleasure if you quit? Here are some questions to ask your health care provider:  How can you help me to be successful at quitting?  What medicine do you think would be best for me and how should I take it?  What should I do if I need more help?  What is smoking withdrawal like? How can I get information on withdrawal? GET READY  Set a quit date.  Change your environment by getting rid of all cigarettes, ashtrays, matches, and lighters in your home, car, or work. Do not let people smoke in your home.  Review your past attempts to quit. Think about what worked and what did not. GET SUPPORT AND ENCOURAGEMENT You have a better chance of being successful if you have help. You can get support in many ways.  Tell your family, friends, and coworkers that you are going to quit and need their support. Ask them not to smoke around you.  Get individual, group, or telephone counseling and support. Programs are available at local hospitals and health centers. Call your local health department for information about programs in your area.  Spiritual beliefs and practices may help some smokers quit.  Download a "quit meter" on your computer to keep track of quit statistics, such as how long you have gone without smoking, cigarettes not smoked, and money saved.  Get a self-help book about quitting smoking and staying off tobacco. LEARN NEW SKILLS AND BEHAVIORS  Distract yourself from urges to smoke. Talk to someone, go for a walk, or occupy your time with a task.  Change your normal routine. Take a different route to work. Drink tea  instead of coffee. Eat breakfast in a different place.  Reduce your stress. Take a hot bath, exercise, or read a book.  Plan something enjoyable to do every day. Reward yourself for not smoking.  Explore interactive web-based programs that specialize in helping you quit. GET MEDICINE AND USE IT CORRECTLY Medicines can help you stop smoking and decrease the urge to smoke. Combining medicine with the above behavioral methods and support can greatly increase your chances of successfully quitting smoking.  Nicotine replacement therapy helps deliver nicotine to your body without the negative effects and risks of smoking. Nicotine replacement therapy includes nicotine gum, lozenges,   inhalers, nasal sprays, and skin patches. Some may be available over-the-counter and others require a prescription.  Antidepressant medicine helps people abstain from smoking, but how this works is unknown. This medicine is available by prescription.  Nicotinic receptor partial agonist medicine simulates the effect of nicotine in your brain. This medicine is available by prescription. Ask your health care provider for advice about which medicines to use and how to use them based on your health history. Your health care provider will tell you what side effects to look out for if you choose to be on a medicine or therapy. Carefully read the information on the package. Do not use any other product containing nicotine while using a nicotine replacement product.  RELAPSE OR DIFFICULT SITUATIONS Most relapses occur within the first 3 months after quitting. Do not be discouraged if you start smoking again. Remember, most people try several times before finally quitting. You may have symptoms of withdrawal because your body is used to nicotine. You may crave cigarettes, be irritable, feel very hungry, cough often, get headaches, or have difficulty concentrating. The withdrawal symptoms are only temporary. They are strongest when you  first quit, but they will go away within 10-14 days. To reduce the chances of relapse, try to:  Avoid drinking alcohol. Drinking lowers your chances of successfully quitting.  Reduce the amount of caffeine you consume. Once you quit smoking, the amount of caffeine in your body increases and can give you symptoms, such as a rapid heartbeat, sweating, and anxiety.  Avoid smokers because they can make you want to smoke.  Do not let weight gain distract you. Many smokers will gain weight when they quit, usually less than 10 pounds. Eat a healthy diet and stay active. You can always lose the weight gained after you quit.  Find ways to improve your mood other than smoking. FOR MORE INFORMATION  www.smokefree.gov  Document Released: 05/14/2001 Document Revised: 10/04/2013 Document Reviewed: 08/29/2011 [employer] Patient Information 2015 [employer]. This information is not intended to replace advice given to you by your health care provider. Make sure you discuss any questions you have with your health care provider.    Smoking Cessation, Tips for Success If you are ready to quit smoking, congratulations! You have chosen to help yourself be healthier. Cigarettes bring nicotine, tar, carbon monoxide, and other irritants into your body. Your lungs, heart, and blood vessels will be able to work better without these poisons. There are many different ways to quit smoking. Nicotine gum, nicotine patches, a nicotine inhaler, or nicotine nasal spray can help with physical craving. Hypnosis, support groups, and medicines help break the habit of smoking. WHAT THINGS CAN I DO TO MAKE QUITTING EASIER?  Here are some tips to help you quit for good:  Pick a date when you will quit smoking completely. Tell all of your friends and family about your plan to quit on that date.  Do not try to slowly cut down on the number of cigarettes you are smoking. Pick a quit date and quit smoking completely starting on that  day.  Throw away all cigarettes.   Clean and remove all ashtrays from your home, work, and car.  On a card, write down your reasons for quitting. Carry the card with you and read it when you get the urge to smoke.  Cleanse your body of nicotine. Drink enough water and fluids to keep your urine clear or pale yellow. Do this after quitting to flush the nicotine from   your body.  Learn to predict your moods. Do not let a bad situation be your excuse to have a cigarette. Some situations in your life might tempt you into wanting a cigarette.  Never have "just one" cigarette. It leads to wanting another and another. Remind yourself of your decision to quit.  Change habits associated with smoking. If you smoked while driving or when feeling stressed, try other activities to replace smoking. Stand up when drinking your coffee. Brush your teeth after eating. Sit in a different chair when you read the paper. Avoid alcohol while trying to quit, and try to drink fewer caffeinated beverages. Alcohol and caffeine may urge you to smoke.  Avoid foods and drinks that can trigger a desire to smoke, such as sugary or spicy foods and alcohol.  Ask people who smoke not to smoke around you.  Have something planned to do right after eating or having a cup of coffee. For example, plan to take a walk or exercise.  Try a relaxation exercise to calm you down and decrease your stress. Remember, you may be tense and nervous for the first 2 weeks after you quit, but this will pass.  Find new activities to keep your hands busy. Play with a pen, coin, or rubber band. Doodle or draw things on paper.  Brush your teeth right after eating. This will help cut down on the craving for the taste of tobacco after meals. You can also try mouthwash.   Use oral substitutes in place of cigarettes. Try using lemon drops, carrots, cinnamon sticks, or chewing gum. Keep them handy so they are available when you have the urge to  smoke.  When you have the urge to smoke, try deep breathing.  Designate your home as a nonsmoking area.  If you are a heavy smoker, ask your health care provider about a prescription for nicotine chewing gum. It can ease your withdrawal from nicotine.  Reward yourself. Set aside the cigarette money you save and buy yourself something nice.  Look for support from others. Join a support group or smoking cessation program. Ask someone at home or at work to help you with your plan to quit smoking.  Always ask yourself, "Do I need this cigarette or is this just a reflex?" Tell yourself, "Today, I choose not to smoke," or "I do not want to smoke." You are reminding yourself of your decision to quit.  Do not replace cigarette smoking with electronic cigarettes (commonly called e-cigarettes). The safety of e-cigarettes is unknown, and some may contain harmful chemicals.  If you relapse, do not give up! Plan ahead and think about what you will do the next time you get the urge to smoke. HOW WILL I FEEL WHEN I QUIT SMOKING? You may have symptoms of withdrawal because your body is used to nicotine (the addictive substance in cigarettes). You may crave cigarettes, be irritable, feel very hungry, cough often, get headaches, or have difficulty concentrating. The withdrawal symptoms are only temporary. They are strongest when you first quit but will go away within 10-14 days. When withdrawal symptoms occur, stay in control. Think about your reasons for quitting. Remind yourself that these are signs that your body is healing and getting used to being without cigarettes. Remember that withdrawal symptoms are easier to treat than the major diseases that smoking can cause.  Even after the withdrawal is over, expect periodic urges to smoke. However, these cravings are generally short lived and will go away whether you   smoke or not. Do not smoke! WHAT RESOURCES ARE AVAILABLE TO HELP ME QUIT SMOKING? Your health care  provider can direct you to community resources or hospitals for support, which may include:  Group support.  Education.  Hypnosis.  Therapy. Document Released: 02/16/2004 Document Revised: 10/04/2013 Document Reviewed: 11/05/2012 [facility] Patient Information 2015 [place], [place]. This information is not intended to replace advice given to you by your health care provider. Make sure you discuss any questions you have with your health care provider.   Stroke Prevention Some medical conditions and behaviors are associated with an increased chance of having a stroke. You may prevent a stroke by making healthy choices and managing medical conditions. HOW CAN I REDUCE MY RISK OF HAVING A STROKE?   Stay physically active. Get at least 30 minutes of activity on most or all days.  Do not smoke. It may also be helpful to avoid exposure to secondhand smoke.  Limit alcohol use. Moderate alcohol use is considered to be:  No more than 2 drinks per day for men.  No more than 1 drink per day for nonpregnant women.  Eat healthy foods. This involves:  Eating 5 or more servings of fruits and vegetables a day.  Making dietary changes that address high blood pressure (hypertension), high cholesterol, diabetes, or obesity.  Manage your cholesterol levels.  Making food choices that are high in fiber and low in saturated fat, trans fat, and cholesterol may control cholesterol levels.  Take any prescribed medicines to control cholesterol as directed by your health care provider.  Manage your diabetes.  Controlling your carbohydrate and sugar intake is recommended to manage diabetes.  Take any prescribed medicines to control diabetes as directed by your health care provider.  Control your hypertension.  Making food choices that are low in salt (sodium), saturated fat, trans fat, and cholesterol is recommended to manage hypertension.  Take any prescribed medicines to control hypertension as  directed by your health care provider.  Maintain a healthy weight.  Reducing calorie intake and making food choices that are low in sodium, saturated fat, trans fat, and cholesterol are recommended to manage weight.  Stop drug abuse.  Avoid taking birth control pills.  Talk to your health care provider about the risks of taking birth control pills if you are over 51 years old, smoke, get migraines, or have ever had a blood clot.  Get evaluated for sleep disorders (sleep apnea).  Talk to your health care provider about getting a sleep evaluation if you snore a lot or have excessive sleepiness.  Take medicines only as directed by your health care provider.  For some people, aspirin or blood thinners (anticoagulants) are helpful in reducing the risk of forming abnormal blood clots that can lead to stroke. If you have the irregular heart rhythm of atrial fibrillation, you should be on a blood thinner unless there is a good reason you cannot take them.  Understand all your medicine instructions.  Make sure that other conditions (such as anemia or atherosclerosis) are addressed. SEEK IMMEDIATE MEDICAL CARE IF:   You have sudden weakness or numbness of the face, arm, or leg, especially on one side of the body.  Your face or eyelid droops to one side.  You have sudden confusion.  You have trouble speaking (aphasia) or understanding.  You have sudden trouble seeing in one or both eyes.  You have sudden trouble walking.  You have dizziness.  You have a loss of balance or coordination.  You have a sudden, severe headache with no known cause.  You have new chest pain or an irregular heartbeat. Any of these symptoms may represent a serious problem that is an emergency. Do not wait to see if the symptoms will go away. Get medical help at once. Call your local emergency services (911 in [place] Do not drive yourself to the hospital. Document Released: 06/27/2004 Document Revised:  10/04/2013 Document Reviewed: 11/20/2012 [facility] Patient Information 2015 [place], [place]. This information is not intended to replace advice given to you by your health care provider. Make sure you discuss any questions you have with your health care provider.

## 2014-06-09 NOTE — Addendum Note (Signed)
Addended by: Mena Goes on: 06/09/2014 02:58 PM   Modules accepted: Orders

## 2014-07-04 DIAGNOSIS — R05 Cough: Secondary | ICD-10-CM | POA: Diagnosis not present

## 2014-07-04 DIAGNOSIS — R079 Chest pain, unspecified: Secondary | ICD-10-CM | POA: Diagnosis not present

## 2014-08-02 HISTORY — PX: TRANSTHORACIC ECHOCARDIOGRAM: SHX275

## 2014-08-09 DIAGNOSIS — E118 Type 2 diabetes mellitus with unspecified complications: Secondary | ICD-10-CM | POA: Diagnosis not present

## 2014-08-09 DIAGNOSIS — C029 Malignant neoplasm of tongue, unspecified: Secondary | ICD-10-CM | POA: Diagnosis not present

## 2014-08-14 ENCOUNTER — Emergency Department (HOSPITAL_COMMUNITY): Payer: Medicare Other

## 2014-08-14 ENCOUNTER — Inpatient Hospital Stay (HOSPITAL_COMMUNITY)
Admission: EM | Admit: 2014-08-14 | Discharge: 2014-08-18 | DRG: 871 | Disposition: A | Discharge status: Home / Self Care | Payer: Medicare Other | Attending: Internal Medicine | Admitting: Internal Medicine

## 2014-08-14 ENCOUNTER — Inpatient Hospital Stay (HOSPITAL_COMMUNITY): Payer: Medicare Other

## 2014-08-14 ENCOUNTER — Other Ambulatory Visit (HOSPITAL_COMMUNITY): Payer: Self-pay

## 2014-08-14 ENCOUNTER — Encounter (HOSPITAL_COMMUNITY): Payer: Self-pay | Admitting: Nurse Practitioner

## 2014-08-14 ENCOUNTER — Emergency Department (HOSPITAL_COMMUNITY): Admission: EM | Admit: 2014-08-14 | Discharge: 2014-08-14 | Discharge status: Absent without leave | Payer: Self-pay

## 2014-08-14 DIAGNOSIS — R6520 Severe sepsis without septic shock: Secondary | ICD-10-CM | POA: Diagnosis not present

## 2014-08-14 DIAGNOSIS — F329 Major depressive disorder, single episode, unspecified: Secondary | ICD-10-CM | POA: Diagnosis not present

## 2014-08-14 DIAGNOSIS — Z7982 Long term (current) use of aspirin: Secondary | ICD-10-CM | POA: Diagnosis not present

## 2014-08-14 DIAGNOSIS — Z794 Long term (current) use of insulin: Secondary | ICD-10-CM | POA: Diagnosis not present

## 2014-08-14 DIAGNOSIS — K219 Gastro-esophageal reflux disease without esophagitis: Secondary | ICD-10-CM | POA: Diagnosis present

## 2014-08-14 DIAGNOSIS — I252 Old myocardial infarction: Secondary | ICD-10-CM | POA: Diagnosis not present

## 2014-08-14 DIAGNOSIS — E780 Pure hypercholesterolemia: Secondary | ICD-10-CM | POA: Diagnosis present

## 2014-08-14 DIAGNOSIS — R6521 Severe sepsis with septic shock: Secondary | ICD-10-CM | POA: Diagnosis not present

## 2014-08-14 DIAGNOSIS — Z716 Tobacco abuse counseling: Secondary | ICD-10-CM | POA: Diagnosis not present

## 2014-08-14 DIAGNOSIS — I25119 Atherosclerotic heart disease of native coronary artery with unspecified angina pectoris: Secondary | ICD-10-CM | POA: Diagnosis present

## 2014-08-14 DIAGNOSIS — Z91048 Other nonmedicinal substance allergy status: Secondary | ICD-10-CM | POA: Diagnosis not present

## 2014-08-14 DIAGNOSIS — J449 Chronic obstructive pulmonary disease, unspecified: Secondary | ICD-10-CM | POA: Diagnosis not present

## 2014-08-14 DIAGNOSIS — F17210 Nicotine dependence, cigarettes, uncomplicated: Secondary | ICD-10-CM | POA: Diagnosis present

## 2014-08-14 DIAGNOSIS — R079 Chest pain, unspecified: Secondary | ICD-10-CM | POA: Diagnosis not present

## 2014-08-14 DIAGNOSIS — Z7951 Long term (current) use of inhaled steroids: Secondary | ICD-10-CM

## 2014-08-14 DIAGNOSIS — Z7902 Long term (current) use of antithrombotics/antiplatelets: Secondary | ICD-10-CM | POA: Diagnosis not present

## 2014-08-14 DIAGNOSIS — E785 Hyperlipidemia, unspecified: Secondary | ICD-10-CM | POA: Diagnosis not present

## 2014-08-14 DIAGNOSIS — IMO0002 Reserved for concepts with insufficient information to code with codable children: Secondary | ICD-10-CM | POA: Diagnosis present

## 2014-08-14 DIAGNOSIS — I2510 Atherosclerotic heart disease of native coronary artery without angina pectoris: Secondary | ICD-10-CM | POA: Diagnosis present

## 2014-08-14 DIAGNOSIS — J159 Unspecified bacterial pneumonia: Secondary | ICD-10-CM | POA: Diagnosis present

## 2014-08-14 DIAGNOSIS — Z8673 Personal history of transient ischemic attack (TIA), and cerebral infarction without residual deficits: Secondary | ICD-10-CM | POA: Diagnosis not present

## 2014-08-14 DIAGNOSIS — J440 Chronic obstructive pulmonary disease with acute lower respiratory infection: Secondary | ICD-10-CM | POA: Diagnosis not present

## 2014-08-14 DIAGNOSIS — Z720 Tobacco use: Secondary | ICD-10-CM | POA: Diagnosis present

## 2014-08-14 DIAGNOSIS — I959 Hypotension, unspecified: Secondary | ICD-10-CM | POA: Diagnosis present

## 2014-08-14 DIAGNOSIS — R0789 Other chest pain: Secondary | ICD-10-CM | POA: Diagnosis not present

## 2014-08-14 DIAGNOSIS — Z86718 Personal history of other venous thrombosis and embolism: Secondary | ICD-10-CM | POA: Diagnosis not present

## 2014-08-14 DIAGNOSIS — M1990 Unspecified osteoarthritis, unspecified site: Secondary | ICD-10-CM | POA: Diagnosis present

## 2014-08-14 DIAGNOSIS — E1165 Type 2 diabetes mellitus with hyperglycemia: Secondary | ICD-10-CM | POA: Diagnosis not present

## 2014-08-14 DIAGNOSIS — R0600 Dyspnea, unspecified: Secondary | ICD-10-CM

## 2014-08-14 DIAGNOSIS — J189 Pneumonia, unspecified organism: Secondary | ICD-10-CM | POA: Diagnosis not present

## 2014-08-14 DIAGNOSIS — Z923 Personal history of irradiation: Secondary | ICD-10-CM

## 2014-08-14 DIAGNOSIS — J441 Chronic obstructive pulmonary disease with (acute) exacerbation: Secondary | ICD-10-CM | POA: Diagnosis not present

## 2014-08-14 DIAGNOSIS — J13 Pneumonia due to Streptococcus pneumoniae: Secondary | ICD-10-CM | POA: Diagnosis not present

## 2014-08-14 DIAGNOSIS — Z85810 Personal history of malignant neoplasm of tongue: Secondary | ICD-10-CM | POA: Diagnosis not present

## 2014-08-14 DIAGNOSIS — I739 Peripheral vascular disease, unspecified: Secondary | ICD-10-CM | POA: Diagnosis present

## 2014-08-14 DIAGNOSIS — R571 Hypovolemic shock: Secondary | ICD-10-CM | POA: Diagnosis not present

## 2014-08-14 DIAGNOSIS — A419 Sepsis, unspecified organism: Principal | ICD-10-CM | POA: Diagnosis present

## 2014-08-14 DIAGNOSIS — Z452 Encounter for adjustment and management of vascular access device: Secondary | ICD-10-CM | POA: Diagnosis not present

## 2014-08-14 DIAGNOSIS — F1010 Alcohol abuse, uncomplicated: Secondary | ICD-10-CM | POA: Diagnosis not present

## 2014-08-14 DIAGNOSIS — S81809A Unspecified open wound, unspecified lower leg, initial encounter: Secondary | ICD-10-CM | POA: Diagnosis present

## 2014-08-14 DIAGNOSIS — R0602 Shortness of breath: Secondary | ICD-10-CM | POA: Diagnosis not present

## 2014-08-14 LAB — DIFFERENTIAL
Basophils Absolute: 0 10*3/uL (ref 0.0–0.1)
Basophils Relative: 0 % (ref 0–1)
EOS ABS: 0 10*3/uL (ref 0.0–0.7)
Eosinophils Relative: 0 % (ref 0–5)
LYMPHS ABS: 0.3 10*3/uL — AB (ref 0.7–4.0)
Lymphocytes Relative: 3 % — ABNORMAL LOW (ref 12–46)
Monocytes Absolute: 0.2 10*3/uL (ref 0.1–1.0)
Monocytes Relative: 2 % — ABNORMAL LOW (ref 3–12)
NEUTROS ABS: 9.5 10*3/uL — AB (ref 1.7–7.7)
NEUTROS PCT: 95 % — AB (ref 43–77)

## 2014-08-14 LAB — BASIC METABOLIC PANEL
Anion gap: 11 (ref 5–15)
BUN: 9 mg/dL (ref 6–23)
CO2: 26 mmol/L (ref 19–32)
Calcium: 9.1 mg/dL (ref 8.4–10.5)
Chloride: 96 mmol/L (ref 96–112)
Creatinine, Ser: 0.76 mg/dL (ref 0.50–1.10)
GFR calc non Af Amer: 88 mL/min — ABNORMAL LOW (ref >90)
Glucose, Bld: 277 mg/dL — ABNORMAL HIGH (ref 70–99)
Potassium: 4.5 mmol/L (ref 3.5–5.1)
SODIUM: 133 mmol/L — AB (ref 135–145)

## 2014-08-14 LAB — CBC
HEMATOCRIT: 48.7 % — AB (ref 36.0–46.0)
Hemoglobin: 16.7 g/dL — ABNORMAL HIGH (ref 12.0–15.0)
MCH: 31.7 pg (ref 26.0–34.0)
MCHC: 34.3 g/dL (ref 30.0–36.0)
MCV: 92.4 fL (ref 78.0–100.0)
PLATELETS: 150 10*3/uL (ref 150–400)
RBC: 5.27 MIL/uL — ABNORMAL HIGH (ref 3.87–5.11)
RDW: 14.5 % (ref 11.5–15.5)
WBC: 10 10*3/uL (ref 4.0–10.5)

## 2014-08-14 LAB — I-STAT TROPONIN, ED: Troponin i, poc: 0 ng/mL (ref 0.00–0.08)

## 2014-08-14 LAB — INFLUENZA PANEL BY PCR (TYPE A & B)
H1N1 flu by pcr: NOT DETECTED
INFLAPCR: NEGATIVE
INFLBPCR: NEGATIVE

## 2014-08-14 LAB — PROTIME-INR
INR: 1.13 (ref 0.00–1.49)
Prothrombin Time: 14.6 seconds (ref 11.6–15.2)

## 2014-08-14 LAB — LACTIC ACID, PLASMA: LACTIC ACID, VENOUS: 1.5 mmol/L (ref 0.5–2.0)

## 2014-08-14 LAB — I-STAT CG4 LACTIC ACID, ED: Lactic Acid, Venous: 1.41 mmol/L (ref 0.5–2.0)

## 2014-08-14 LAB — PROCALCITONIN: PROCALCITONIN: 4.51 ng/mL

## 2014-08-14 LAB — TROPONIN I: Troponin I: <0.03 ng/mL (ref <0.031)

## 2014-08-14 LAB — GLUCOSE, CAPILLARY: GLUCOSE-CAPILLARY: 236 mg/dL — AB (ref 70–99)

## 2014-08-14 LAB — STREP PNEUMONIAE URINARY ANTIGEN: Strep Pneumo Urinary Antigen: POSITIVE — AB

## 2014-08-14 LAB — APTT: APTT: 33 s (ref 24–37)

## 2014-08-14 MED ORDER — CLOPIDOGREL BISULFATE 75 MG PO TABS
75.0000 mg | ORAL_TABLET | Freq: Every day | ORAL | Status: DC
  Administered 2014-08-15 – 2014-08-18 (×4): 75 mg via ORAL
  Filled 2014-08-14 (×4): qty 1

## 2014-08-14 MED ORDER — IPRATROPIUM-ALBUTEROL 0.5-2.5 (3) MG/3ML IN SOLN
3.0000 mL | Freq: Once | RESPIRATORY_TRACT | Status: AC
  Administered 2014-08-14: 3 mL via RESPIRATORY_TRACT
  Filled 2014-08-14: qty 3

## 2014-08-14 MED ORDER — LEVALBUTEROL HCL 0.63 MG/3ML IN NEBU: 0.6300 mg | INHALATION_SOLUTION | Freq: Three times a day (TID) | RESPIRATORY_TRACT | Status: DC

## 2014-08-14 MED ORDER — LACTATED RINGERS IV SOLN
INTRAVENOUS | Status: DC
  Administered 2014-08-14 – 2014-08-15 (×2): 1000 via INTRAVENOUS

## 2014-08-14 MED ORDER — VANCOMYCIN HCL IN DEXTROSE 1-5 GM/200ML-% IV SOLN: 1000.0000 mg | Freq: Once | INTRAVENOUS | Status: DC

## 2014-08-14 MED ORDER — CHLORHEXIDINE GLUCONATE 0.12 % MT SOLN
15.0000 mL | Freq: Two times a day (BID) | OROMUCOSAL | Status: DC
  Administered 2014-08-14 – 2014-08-18 (×6): 15 mL via OROMUCOSAL
  Filled 2014-08-14 (×11): qty 15

## 2014-08-14 MED ORDER — CEFTRIAXONE SODIUM 1 G IJ SOLR
1.0000 g | Freq: Once | INTRAMUSCULAR | Status: AC
  Administered 2014-08-14: 1 g via INTRAVENOUS
  Filled 2014-08-14: qty 10

## 2014-08-14 MED ORDER — CLOPIDOGREL BISULFATE 75 MG PO TABS: 75.0000 mg | ORAL_TABLET | Freq: Every day | ORAL | Status: DC

## 2014-08-14 MED ORDER — ACETAMINOPHEN 325 MG PO TABS
650.0000 mg | ORAL_TABLET | Freq: Once | ORAL | Status: AC
Start: 2014-08-14 — End: 2014-08-14
  Administered 2014-08-14: 650 mg via ORAL
  Filled 2014-08-14: qty 2

## 2014-08-14 MED ORDER — BUPROPION HCL ER (XL) 150 MG PO TB24
150.0000 mg | ORAL_TABLET | Freq: Every day | ORAL | Status: DC
  Administered 2014-08-14 – 2014-08-18 (×5): 150 mg via ORAL
  Filled 2014-08-14 (×5): qty 1

## 2014-08-14 MED ORDER — THIAMINE HCL 100 MG/ML IJ SOLN
100.0000 mg | Freq: Every day | INTRAMUSCULAR | Status: DC
  Filled 2014-08-14 (×2): qty 1

## 2014-08-14 MED ORDER — DEXTROSE 5 % IV SOLN
500.0000 mg | Freq: Once | INTRAVENOUS | Status: AC
  Administered 2014-08-14: 500 mg via INTRAVENOUS
  Filled 2014-08-14: qty 500

## 2014-08-14 MED ORDER — LORAZEPAM 1 MG PO TABS: 1.0000 mg | ORAL_TABLET | Freq: Four times a day (QID) | ORAL | Status: DC | PRN

## 2014-08-14 MED ORDER — OMEGA-3-ACID ETHYL ESTERS 1 G PO CAPS
1.0000 g | ORAL_CAPSULE | Freq: Two times a day (BID) | ORAL | Status: DC
  Administered 2014-08-14 – 2014-08-18 (×8): 1 g via ORAL
  Filled 2014-08-14 (×11): qty 1

## 2014-08-14 MED ORDER — SODIUM CHLORIDE 0.9 % IV SOLN: 250.0000 mL | INTRAVENOUS | Status: DC | PRN

## 2014-08-14 MED ORDER — CEFTRIAXONE SODIUM IN DEXTROSE 20 MG/ML IV SOLN
1.0000 g | Freq: Once | INTRAVENOUS | Status: AC
  Administered 2014-08-15: 1 g via INTRAVENOUS
  Filled 2014-08-14 (×2): qty 50

## 2014-08-14 MED ORDER — ONDANSETRON HCL 4 MG/2ML IJ SOLN
4.0000 mg | Freq: Four times a day (QID) | INTRAMUSCULAR | Status: DC | PRN
Start: 2014-08-14 — End: 2014-08-18

## 2014-08-14 MED ORDER — SODIUM CHLORIDE 0.9 % IV BOLUS (SEPSIS)
1000.0000 mL | INTRAVENOUS | Status: DC | PRN
  Administered 2014-08-14 (×2): 1000 mL via INTRAVENOUS

## 2014-08-14 MED ORDER — ONDANSETRON HCL 4 MG/2ML IJ SOLN: 4.0000 mg | Freq: Four times a day (QID) | INTRAMUSCULAR | Status: DC | PRN

## 2014-08-14 MED ORDER — PREDNISONE 20 MG PO TABS
40.0000 mg | ORAL_TABLET | Freq: Every day | ORAL | Status: DC
  Administered 2014-08-15: 40 mg via ORAL
  Filled 2014-08-14 (×2): qty 2

## 2014-08-14 MED ORDER — INSULIN GLARGINE 100 UNIT/ML ~~LOC~~ SOLN
20.0000 [IU] | Freq: Every day | SUBCUTANEOUS | Status: DC
  Administered 2014-08-14 – 2014-08-17 (×4): 20 [IU] via SUBCUTANEOUS
  Filled 2014-08-14 (×6): qty 0.2

## 2014-08-14 MED ORDER — CEFEPIME HCL 2 G IJ SOLR
2.0000 g | INTRAMUSCULAR | Status: DC
  Administered 2014-08-14: 2 g via INTRAVENOUS
  Filled 2014-08-14: qty 2

## 2014-08-14 MED ORDER — SODIUM CHLORIDE 0.9 % IV BOLUS (SEPSIS)
1000.0000 mL | Freq: Once | INTRAVENOUS | Status: AC
  Administered 2014-08-14: 1000 mL via INTRAVENOUS

## 2014-08-14 MED ORDER — IPRATROPIUM-ALBUTEROL 0.5-2.5 (3) MG/3ML IN SOLN: 3.0000 mL | RESPIRATORY_TRACT | Status: DC | PRN

## 2014-08-14 MED ORDER — SODIUM CHLORIDE 0.9 % IJ SOLN
3.0000 mL | Freq: Two times a day (BID) | INTRAMUSCULAR | Status: DC
  Administered 2014-08-14 – 2014-08-17 (×6): 3 mL via INTRAVENOUS

## 2014-08-14 MED ORDER — GUAIFENESIN ER 600 MG PO TB12
600.0000 mg | ORAL_TABLET | Freq: Two times a day (BID) | ORAL | Status: DC
  Administered 2014-08-14 – 2014-08-15 (×2): 600 mg via ORAL
  Filled 2014-08-14 (×3): qty 1

## 2014-08-14 MED ORDER — FOLIC ACID 1 MG PO TABS
1.0000 mg | ORAL_TABLET | Freq: Every day | ORAL | Status: DC
Start: 2014-08-14 — End: 2014-08-18
  Administered 2014-08-14 – 2014-08-18 (×5): 1 mg via ORAL
  Filled 2014-08-14 (×5): qty 1

## 2014-08-14 MED ORDER — CETYLPYRIDINIUM CHLORIDE 0.05 % MT LIQD
7.0000 mL | Freq: Two times a day (BID) | OROMUCOSAL | Status: DC
  Administered 2014-08-15 – 2014-08-18 (×5): 7 mL via OROMUCOSAL

## 2014-08-14 MED ORDER — HYDROCODONE-ACETAMINOPHEN 5-325 MG PO TABS: 1.0000 | ORAL_TABLET | ORAL | Status: DC | PRN

## 2014-08-14 MED ORDER — ASPIRIN EC 81 MG PO TBEC: 81.0000 mg | DELAYED_RELEASE_TABLET | Freq: Every day | ORAL | Status: DC

## 2014-08-14 MED ORDER — MORPHINE SULFATE 2 MG/ML IJ SOLN: 2.0000 mg | INTRAMUSCULAR | Status: DC | PRN

## 2014-08-14 MED ORDER — HEPARIN SODIUM (PORCINE) 5000 UNIT/ML IJ SOLN
5000.0000 [IU] | Freq: Three times a day (TID) | INTRAMUSCULAR | Status: DC
  Administered 2014-08-14 – 2014-08-15 (×2): 5000 [IU] via SUBCUTANEOUS
  Filled 2014-08-14 (×4): qty 1

## 2014-08-14 MED ORDER — VITAMIN B-1 100 MG PO TABS
100.0000 mg | ORAL_TABLET | Freq: Every day | ORAL | Status: DC
  Administered 2014-08-14 – 2014-08-18 (×5): 100 mg via ORAL
  Filled 2014-08-14 (×5): qty 1

## 2014-08-14 MED ORDER — ONDANSETRON HCL 4 MG PO TABS: 4.0000 mg | ORAL_TABLET | Freq: Four times a day (QID) | ORAL | Status: DC | PRN

## 2014-08-14 MED ORDER — ENOXAPARIN SODIUM 40 MG/0.4ML ~~LOC~~ SOLN: 40.0000 mg | SUBCUTANEOUS | Status: DC

## 2014-08-14 MED ORDER — INSULIN GLARGINE 100 UNIT/ML ~~LOC~~ SOLN: 20.0000 [IU] | Freq: Every day | SUBCUTANEOUS | Status: DC

## 2014-08-14 MED ORDER — ALUM & MAG HYDROXIDE-SIMETH 200-200-20 MG/5ML PO SUSP
30.0000 mL | Freq: Four times a day (QID) | ORAL | Status: DC | PRN
  Filled 2014-08-14: qty 30

## 2014-08-14 MED ORDER — BUDESONIDE 0.5 MG/2ML IN SUSP
0.5000 mg | Freq: Two times a day (BID) | RESPIRATORY_TRACT | Status: DC
  Administered 2014-08-15 – 2014-08-16 (×2): 0.5 mg via RESPIRATORY_TRACT
  Filled 2014-08-14 (×6): qty 2

## 2014-08-14 MED ORDER — PANTOPRAZOLE SODIUM 40 MG PO TBEC: 40.0000 mg | DELAYED_RELEASE_TABLET | Freq: Every day | ORAL | Status: DC

## 2014-08-14 MED ORDER — ACETAMINOPHEN 650 MG RE SUPP
650.0000 mg | Freq: Four times a day (QID) | RECTAL | Status: DC | PRN
Start: 2014-08-14 — End: 2014-08-15

## 2014-08-14 MED ORDER — DEXTROSE 5 % IV SOLN: 2.0000 g | Freq: Once | INTRAVENOUS | Status: DC

## 2014-08-14 MED ORDER — METHYLPREDNISOLONE SODIUM SUCC 125 MG IJ SOLR
60.0000 mg | Freq: Four times a day (QID) | INTRAMUSCULAR | Status: DC
  Administered 2014-08-14: 60 mg via INTRAVENOUS
  Filled 2014-08-14: qty 2

## 2014-08-14 MED ORDER — ROSUVASTATIN CALCIUM 20 MG PO TABS: 20.0000 mg | ORAL_TABLET | Freq: Every day | ORAL | Status: DC

## 2014-08-14 MED ORDER — VANCOMYCIN HCL 10 G IV SOLR
1250.0000 mg | INTRAVENOUS | Status: DC
  Administered 2014-08-14: 1250 mg via INTRAVENOUS
  Filled 2014-08-14: qty 1250

## 2014-08-14 MED ORDER — NOREPINEPHRINE BITARTRATE 1 MG/ML IV SOLN
0.0000 ug/min | INTRAVENOUS | Status: DC
  Administered 2014-08-14: 2 ug/min via INTRAVENOUS
  Filled 2014-08-14: qty 4

## 2014-08-14 MED ORDER — GUAIFENESIN-CODEINE 100-10 MG/5ML PO SOLN
10.0000 mL | Freq: Once | ORAL | Status: AC
  Administered 2014-08-14: 10 mL via ORAL
  Filled 2014-08-14: qty 10

## 2014-08-14 MED ORDER — INSULIN ASPART 100 UNIT/ML ~~LOC~~ SOLN: 2.0000 [IU] | SUBCUTANEOUS | Status: DC

## 2014-08-14 MED ORDER — LORAZEPAM 2 MG/ML IJ SOLN: 1.0000 mg | Freq: Four times a day (QID) | INTRAMUSCULAR | Status: DC | PRN

## 2014-08-14 MED ORDER — ACETAMINOPHEN 325 MG PO TABS: 650.0000 mg | ORAL_TABLET | Freq: Four times a day (QID) | ORAL | Status: DC | PRN

## 2014-08-14 MED ORDER — IPRATROPIUM-ALBUTEROL 0.5-2.5 (3) MG/3ML IN SOLN: 3.0000 mL | Freq: Four times a day (QID) | RESPIRATORY_TRACT | Status: DC

## 2014-08-14 MED ORDER — SODIUM CHLORIDE 0.9 % IV SOLN
INTRAVENOUS | Status: AC
  Administered 2014-08-14: 15:00:00 via INTRAVENOUS

## 2014-08-14 MED ORDER — ASPIRIN EC 81 MG PO TBEC
81.0000 mg | DELAYED_RELEASE_TABLET | Freq: Every day | ORAL | Status: DC
  Administered 2014-08-14 – 2014-08-18 (×5): 81 mg via ORAL
  Filled 2014-08-14 (×5): qty 1

## 2014-08-14 MED ORDER — ENSURE COMPLETE PO LIQD
237.0000 mL | Freq: Two times a day (BID) | ORAL | Status: DC
  Administered 2014-08-15: 237 mL via ORAL

## 2014-08-14 MED ORDER — ADULT MULTIVITAMIN W/MINERALS CH
1.0000 | ORAL_TABLET | Freq: Every day | ORAL | Status: DC
  Administered 2014-08-14 – 2014-08-18 (×5): 1 via ORAL
  Filled 2014-08-14 (×5): qty 1

## 2014-08-14 MED ORDER — INSULIN ASPART 100 UNIT/ML ~~LOC~~ SOLN: 0.0000 [IU] | Freq: Three times a day (TID) | SUBCUTANEOUS | Status: DC

## 2014-08-14 MED ORDER — DOCUSATE SODIUM 100 MG PO CAPS
100.0000 mg | ORAL_CAPSULE | Freq: Every day | ORAL | Status: DC
  Administered 2014-08-15 – 2014-08-18 (×4): 100 mg via ORAL
  Filled 2014-08-14 (×5): qty 1

## 2014-08-14 MED ORDER — DEXTROSE 5 % IV SOLN
500.0000 mg | Freq: Once | INTRAVENOUS | Status: AC
  Administered 2014-08-15: 500 mg via INTRAVENOUS
  Filled 2014-08-14 (×2): qty 500

## 2014-08-14 MED ORDER — PANTOPRAZOLE SODIUM 40 MG PO TBEC
40.0000 mg | DELAYED_RELEASE_TABLET | Freq: Every day | ORAL | Status: DC
  Administered 2014-08-14: 40 mg via ORAL

## 2014-08-14 MED ORDER — PANTOPRAZOLE SODIUM 40 MG PO TBEC
40.0000 mg | DELAYED_RELEASE_TABLET | Freq: Every day | ORAL | Status: DC
  Administered 2014-08-15 – 2014-08-18 (×4): 40 mg via ORAL
  Filled 2014-08-14 (×5): qty 1

## 2014-08-14 MED ORDER — NITROGLYCERIN 0.4 MG SL SUBL: 0.4000 mg | SUBLINGUAL_TABLET | SUBLINGUAL | Status: DC | PRN

## 2014-08-14 MED ORDER — ROSUVASTATIN CALCIUM 20 MG PO TABS
20.0000 mg | ORAL_TABLET | Freq: Every day | ORAL | Status: DC
  Administered 2014-08-14 – 2014-08-17 (×2): 20 mg via ORAL
  Filled 2014-08-14 (×6): qty 1

## 2014-08-14 MED ORDER — SODIUM CHLORIDE 0.9 % IV SOLN
INTRAVENOUS | Status: DC
  Administered 2014-08-14: 1000 mL via INTRAVENOUS

## 2014-08-14 MED ORDER — DOPAMINE-DEXTROSE 3.2-5 MG/ML-% IV SOLN
2.0000 ug/kg/min | INTRAVENOUS | Status: DC
  Administered 2014-08-14: 2 ug/kg/min via INTRAVENOUS
  Filled 2014-08-14: qty 250

## 2014-08-14 NOTE — H&P (Signed)
[facility] History and Physical                                                                                    Brenda [name], is a 64 y.o. female  MRN: [id number]   DOB - Nov 20, 1950  Admit Date - 08/14/2014  Outpatient Primary MD for the patient is Dwan Bolt, MD  With History of -  Past Medical History  Diagnosis Date  . History of DVT (deep vein thrombosis)   . Coronary artery disease     multiple stens  . COPD (chronic obstructive pulmonary disease)   . Transient ischemic attack   . Depression   . Hx of radiation therapy 01/16/09 - 03/06/09    base of tongue, right neck node  . Peripheral arterial disease     status post bilateral femoropopliteal bypass graft performed by Dr. Sherren Mocha early in the past with significant right lower extremity lifestyle limiting claudication  . Pneumonia   . Hypercholesteremia   . Chronic bronchitis   . Shortness of breath     "w/exertion and sometimes when laying down" (11/26/2012)  . Type II diabetes mellitus   . History of blood transfusion 1955  . GERD (gastroesophageal reflux disease)   . H/O hiatal hernia   . Stroke 2000    left side weakness remains (11/26/2012)  . Arthritis     "hands" (11/26/2012)  . 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 Myocardial infarction 2005    dr Ellyn Hack  with SE  . Fatigue 08/23/2013  . Carotid artery occlusion       Past Surgical History  Procedure Laterality Date  . Tee without cardioversion  07/26/2011    Procedure: TRANSESOPHAGEAL ECHOCARDIOGRAM (TEE);  Surgeon: Pixie Casino, MD;  Location: [facility] ENDOSCOPY;  Service: Cardiovascular;  Laterality: N/A;  . Esophagogastroduodenoscopy  12/20/2011    Procedure: ESOPHAGOGASTRODUODENOSCOPY (EGD);  Surgeon: Beryle Beams, MD;  Location: [facility] END[facility];  Service: Endoscopy;  Laterality: N/A;  EGD with balloon dilation  . Savory dilation  12/20/2011    Procedure: SAVORY DILATION;  Surgeon: Beryle Beams, MD;   Location: [facility] ENDOSCOPY;  Service: Endoscopy;  Laterality: N/A;  . Tonsillectomy  1969  . Appendectomy  1962  . Cardiac catheterization  1999; 11/26/2012  . Coronary angioplasty with stent placement  1999--2005    "i think I have 3-5 total" (11/26/2012)  . Femoral bypass  1999; 2000    Archie Endo 11/26/2012  . Carpal tunnel release Right 1980's?  . Vaginal hysterectomy  1988  . Cesarean section  1978  . Tubal ligation  1988  . Cataract extraction w/ intraocular lens implant Right 2011  . Ankle fracture surgery Right 2007    "got a metal plate and 8 screws in it" (11/26/2012)  . Bypass graft popliteal to popliteal Right 02/05/2013    Procedure: BYPASS GRAFT ABOVE KNEE POPLITEAL TO BELOW KNEE POPLITEAL WITH SMALL SAPHANOUS VEIN;  Surgeon: Rosetta Posner, MD;  Location: [facility];  Service: Vascular;  Laterality: Right;  . Eye surgery      in for   Chief Complaint  Patient presents with  . Chest Pain     HPI  Brenda [name]  is a 64 y.o. female, with a past medical history of CAD (with multiple stents), PAD, carotid artery disease, CVA, COPD, diabetes, oral cancer, and DVT. She presents to the emergency department for chest pain and is found to have sepsis from left lower lobe pneumonia with associated hypotension.   Brenda [name] reports she has been feeling poorly for several days and has had a cough. She is bringing up white sputum. This morning she felt particularly bad and developed centralized chest pain that radiated bilaterally across her ribs and up to her left shoulder. After a short period of time the pain subsided and became more of a pressure. She continues to have the pressure currently (it's been 6 hours).  She has also had some posttussive vomiting today. She is having difficulty with acid reflux and takes omeprazole at home.  In the ER her temperature is 101.6, pulse rate 122, respirations 30, blood pressure 78/58. She is on 2 L of oxygen. She has received 2 L of normal saline and her blood  pressure is currently 85/49. Chest x-ray shows left lower lobe infiltrate. Her white count is not elevated, and her lactic acid is within normal limits. We're admitting her to stepdown for sepsis associated with hypotension secondary to left lower lobe pneumonia and COPD exacerbation. We have asked pulmonary critical care to consult as she will likely need pressors.  Review of Systems   In addition to the HPI above,  No Headache, No changes with Vision or hearing, No problems swallowing food or Liquids, Bowel movements are chronically constipated She has chronic wounds on her right ankle, between her fourth and fifth digit on her right foot, and home her left leg.  No dysuria, No new skin rashes or bruises, No new joints pains-aches,  No new weakness, tingling, numbness in any extremity, No recent weight gain or loss, A full 10 point Review of Systems was done, except as stated above, all other Review of Systems were negative.  Social History History  Substance Use Topics  . Smoking status: Current Every Day Smoker -- 1.00 packs/day for 50 years    Types: Cigarettes  . Smokeless tobacco: Never Used  . Alcohol Use: 6.0 oz/week    10 Glasses of wine per week    Family History Family History  Problem Relation Age of Onset  . Cancer Mother     pancreatic  . Diabetes Mother   . Hyperlipidemia Mother   . Hypertension Mother   . Other Mother     varicose vein  . Cancer Father 72    throat  . Heart disease Father   . Heart attack Father   . Cancer Sister     breast  . Diabetes Sister   . Deep vein thrombosis Brother   . Diabetes Brother   . Hearing loss Brother   . Hyperlipidemia Brother   . Hypertension Brother   . Heart attack Brother   . Clotting disorder Brother   . Diabetes Son   . Hyperlipidemia Son     Prior to Admission medications   Medication Sig Start Date End Date Taking? Authorizing Provider  acetaminophen (TYLENOL) 500 MG tablet Take 1,000 mg by mouth  every 6 (six) hours as needed for pain.   Yes Historical Provider, MD  aspirin EC 81 MG tablet Take 81 mg by mouth daily.   Yes Historical Provider, MD  clopidogrel (PLAVIX)  75 MG tablet Take 75 mg by mouth daily.    Yes Historical Provider, MD  docusate sodium (COLACE) 100 MG capsule Take 100 mg by mouth daily.   Yes Historical Provider, MD  Fiber CAPS Take 2 capsules by mouth daily.    Yes Historical Provider, MD  fish oil-omega-3 fatty acids 1000 MG capsule Take 1 g by mouth 2 (two) times daily.    Yes Historical Provider, MD  Fluticasone Furoate-Vilanterol (BREO ELLIPTA IN) Inhale 1 puff into the lungs daily.   Yes Historical Provider, MD  insulin glargine (LANTUS) 100 UNIT/ML injection Inject 10-20 Units into the skin 2 (two) times daily. Inject 10 units each morning. Inject evening dose as follows: 16 units up to cbg of 250.  20 units if cbg is higher than 250.   Yes Historical Provider, MD  insulin glulisine (APIDRA) 100 UNIT/ML injection Inject 4-8 Units into the skin 4 (four) times daily as needed for high blood sugar. For blood sugar levels   Yes Historical Provider, MD  omeprazole (PRILOSEC) 20 MG capsule Take 20 mg by mouth daily.    Yes Historical Provider, MD  ondansetron (ZOFRAN-ODT) 4 MG disintegrating tablet Take 4 mg by mouth every 8 (eight) hours as needed for nausea.   Yes Historical Provider, MD  rosuvastatin (CRESTOR) 20 MG tablet Take 20 mg by mouth daily.   Yes Historical Provider, MD  buPROPion (WELLBUTRIN XL) 150 MG 24 hr tablet Take 150 mg by mouth daily.    Historical Provider, MD  calcium carbonate, dosed in mg elemental calcium, 1250 MG/5ML Take 500 mg of elemental calcium by mouth daily with breakfast.    Historical Provider, MD  nitroGLYCERIN (NITROLINGUAL) 0.4 MG/SPRAY spray Place 1 spray under the tongue every 5 (five) minutes as needed for chest pain.    Historical Provider, MD    Allergies  Allergen Reactions  . Tape Hives and Other (See Comments)    USE PAPER  TAPE ONLY- Adhesive peels off skin-makes pt. Raw.  Maryjo Rochester [Nicotine] Rash    To the PATCH only, breakouts on skin    Physical Exam  Vitals  Blood pressure 85/49, pulse 101, temperature 100.3 F (37.9 C), temperature source Oral, resp. rate 25, height 5\' 6"  (1.676 m), weight 62.37 kg (137 lb 8 oz), SpO2 98 %.   General: Thin, frail appearing female lying in bed, appears ill. Husband bedside  Psych:  Normal affect and insight, Not Suicidal or Homicidal, Awake Alert, Oriented X 3.  Neuro:   No F.N deficits, ALL C.Nerves Intact, Strength 5/5 all 4 extremities, Sensation intact all 4 extremities.  ENT:  Ears and Eyes appear Normal, Conjunctivae clear, PER. Moist oral mucosa without erythema or exudates.  Neck:  Supple, No lymphadenopathy appreciated  Respiratory:  Decreased breath sounds bilaterally, no frank wheezes rales or rhonchi, nonproductive cough  Cardiac:  Tachycardic, No Murmurs, no LE edema noted, no JVD.    Abdomen:  Positive bowel sounds, Soft, Non tender, Non distended,  No masses appreciated  Skin:  No Cyanosis, Normal Skin Turgor, No Skin Rash or Bruise. Right lateral ankle scabbed and erythematous, swollen erythematous fourth and fifth digit on her right lower extremity.  Extremities:  Able to move all 4. 5/5 strength in each,  no effusions.  Data Review  CBC  Recent Labs Lab 08/14/14 1230  WBC 10.0  HGB 16.7*  HCT 48.7*  PLT 150  MCV 92.4  MCH 31.7  MCHC 34.3  RDW 14.5  LYMPHSABS 0.3*  MONOABS 0.2  EOSABS 0.0  BASOSABS 0.0    Chemistries   Recent Labs Lab 08/14/14 1230  NA 133*  K 4.5  CL 96  CO2 26  GLUCOSE 277*  BUN 9  CREATININE 0.76  CALCIUM 9.1     Urinalysis    Component Value Date/Time   COLORURINE YELLOW 02/04/2013 1323   APPEARANCEUR CLEAR 02/04/2013 1323   LABSPEC 1.016 02/04/2013 1323   PHURINE 6.0 02/04/2013 1323   GLUCOSEU 250* 02/04/2013 1323   HGBUR NEGATIVE 02/04/2013 1323   BILIRUBINUR NEGATIVE 02/04/2013  1323   KETONESUR NEGATIVE 02/04/2013 1323   PROTEINUR NEGATIVE 02/04/2013 1323   UROBILINOGEN 1.0 02/04/2013 1323   NITRITE NEGATIVE 02/04/2013 1323   LEUKOCYTESUR NEGATIVE 02/04/2013 1323    Imaging results:   Dg Chest 2 View  08/14/2014   CLINICAL DATA:  Left-sided chest pain and epigastric pain which began acutely earlier today, associated with acute worsening of chronic shortness of breath. Current history of diabetes, COPD/chronic bronchitis, coronary artery disease. 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  EXAM: CHEST  2 VIEW  COMPARISON:  05/18/2014 and earlier.  FINDINGS: Cardiac silhouette normal in size, unchanged. Thoracic aorta atherosclerotic, unchanged. Hilar and mediastinal contours otherwise unremarkable. Airspace consolidation involving the left lower lobe associated with a small to moderate-sized left pleural effusion. No focal airspace consolidation elsewhere. Prominent bronchovascular markings diffusely and moderate central peribronchial thickening, unchanged. Degenerative changes involving the thoracic spine with exaggeration of the usual thoracic kyphosis, unchanged.  IMPRESSION: Acute left lower lobe pneumonia superimposed upon COPD. Associated small to moderate-sized left parapneumonic effusion.   Electronically Signed   By: Evangeline Dakin M.D.   On: 08/14/2014 13:59    My personal review of EKG: Sinus tach, normal QTC.   Assessment & Plan  Principal Problem:   Sepsis associated hypotension Active Problems:   COPD (chronic obstructive pulmonary disease)   CAP (community acquired pneumonia)   Chest pain at rest   DM (diabetes mellitus), type 2, uncontrolled   History of CVA (cerebrovascular accident)   Tobacco abuse   Hyperlipemia   Claudication in peripheral vascular disease   Coronary artery disease-multiple stents- last cath 2007- Myoview low risk 4/13   Severe sepsis associated with  hypotension. Admitted to stepdown, pulmonary critical care consult requested Received 2 L of IV fluid bolus in the ER, will give a third liter and then gentle IV fluids Blood cultures obtained after antibiotics were started. Blood and urine cultures pending Lactic acid is normal. Pro-calcitonin is pending.  Left lower lobe pneumonia Influenza PCR pending. HIV, urine for Legionella and strep pneumoniae antigen pending We'll place on Vanco and cefepime due to severe sepsis Rest as above.   COPD exacerbation Solu-Medrol 60 every 6 hours, antibiotics as above, Xopenex nebulizer treatments scheduled  Chest pain Likely due to pneumonia and COPD. Will cycle her troponins, place her on an aspirin, check a 2-D echo. Continue Plavix for history of stents.  Tobacco abuse Counseled to stop smoking. Patient unable to tolerate a nicotine patch.  Alcohol use Patient has been placed on CIWA protocol with when necessary Ativan  Diabetes mellitus Place on Lantus 20 units daily at bedtime and sliding scale sensitive. Insulin may need to be titrated up. Hemoglobin A1c pending.  Hyperlipidemia Continue Crestor.    DVT Prophylaxis: Lovenox  AM Labs Ordered, also please review Full Orders  Family Communication:   Husband bedside  Code Status:  Full code  Condition:  Guarded. May need pressors and ICU.  Time spent in minutes : [address]  PA-C on 08/14/2014 at 4:18 PM  Between 7am to 7pm - Pager - [phone]  After 7pm go to [email or web address] - password [email or web address]  And look for the night coverage person covering me after hours  [facility]

## 2014-08-14 NOTE — ED Provider Notes (Signed)
CSN: [id number]     Arrival date & time 08/14/14  1159 History   First MD Initiated Contact with Patient 08/14/14 1212     Chief Complaint  Patient presents with  . Chest Pain     (Consider location/radiation/quality/duration/timing/severity/associated sxs/prior Treatment) HPI Comments: Patient is a 64 year old female past medical history significant for COPD not on home oxygen, CAD, GERD, MI, PAD presenting to the ED for evaluation of chest pain that began this morning at approximately 9:45AM. Patient states the pain is radiating to her ribs. She states this is not felt like her typical anginal pain. She states she attempted take 2 nitroglycerin sprays with no relief. Patient states he's had precipitating cough, nasal congestion, rhinorrhea for the last several days. She endorses subjective fevers and chills.   Past Medical History  Diagnosis Date  . History of DVT (deep vein thrombosis)   . Coronary artery disease     multiple stens  . COPD (chronic obstructive pulmonary disease)   . Transient ischemic attack   . Depression   . Hx of radiation therapy 01/16/09 - 03/06/09    base of tongue, right neck node  . Peripheral arterial disease     status post bilateral femoropopliteal bypass graft performed by Dr. Sherren Mocha early in the past with significant right lower extremity lifestyle limiting claudication  . Pneumonia   . Hypercholesteremia   . Chronic bronchitis   . Shortness of breath     "w/exertion and sometimes when laying down" (11/26/2012)  . Type II diabetes mellitus   . History of blood transfusion 1955  . GERD (gastroesophageal reflux disease)   . H/O hiatal hernia   . Stroke 2000    left side weakness remains (11/26/2012)  . Arthritis     "hands" (11/26/2012)  . 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 Myocardial infarction 2005    dr Ellyn Hack  with [facility]  . Fatigue 08/23/2013  . Carotid artery occlusion    Past Surgical History   Procedure Laterality Date  . Tee without cardioversion  07/26/2011    Procedure: TRANSESOPHAGEAL ECHOCARDIOGRAM (TEE);  Surgeon: Pixie Casino, MD;  Location: [facility] ENDOSCOPY;  Service: Cardiovascular;  Laterality: N/A;  . Esophagogastroduodenoscopy  12/20/2011    Procedure: ESOPHAGOGASTRODUODENOSCOPY (EGD);  Surgeon: Beryle Beams, MD;  Location: [facility] ENDOSCOPY;  Service: Endoscopy;  Laterality: N/A;  EGD with balloon dilation  . Savory dilation  12/20/2011    Procedure: SAVORY DILATION;  Surgeon: Beryle Beams, MD;  Location: [facility] ENDOSCOPY;  Service: Endoscopy;  Laterality: N/A;  . Tonsillectomy  1969  . Appendectomy  1962  . Cardiac catheterization  1999; 11/26/2012  . Coronary angioplasty with stent placement  1999--2005    "i think I have 3-5 total" (11/26/2012)  . Femoral bypass  1999; 2000    Archie Endo 11/26/2012  . Carpal tunnel release Right 1980's?  . Vaginal hysterectomy  1988  . Cesarean section  1978  . Tubal ligation  1988  . Cataract extraction w/ intraocular lens implant Right 2011  . Ankle fracture surgery Right 2007    "got a metal plate and 8 screws in it" (11/26/2012)  . Bypass graft popliteal to popliteal Right 02/05/2013    Procedure: BYPASS GRAFT ABOVE KNEE POPLITEAL TO BELOW KNEE POPLITEAL WITH SMALL SAPHANOUS VEIN;  Surgeon: Rosetta Posner, MD;  Location: [place];  Service: Vascular;  Laterality: Right;  . Eye surgery  Family History  Problem Relation Age of Onset  . Cancer Mother     pancreatic  . Diabetes Mother   . Hyperlipidemia Mother   . Hypertension Mother   . Other Mother     varicose vein  . Cancer Father 72    throat  . Heart disease Father   . Heart attack Father   . Cancer Sister     breast  . Diabetes Sister   . Deep vein thrombosis Brother   . Diabetes Brother   . Hearing loss Brother   . Hyperlipidemia Brother   . Hypertension Brother   . Heart attack Brother   . Clotting disorder Brother   . Diabetes Son   . Hyperlipidemia Son    History   Substance Use Topics  . Smoking status: Current Every Day Smoker -- 1.00 packs/day for 50 years    Types: Cigarettes  . Smokeless tobacco: Never Used  . Alcohol Use: 6.0 oz/week    10 Glasses of wine per week   OB History    No data available     Review of Systems  Constitutional: Positive for fever and chills.  Respiratory: Positive for cough, chest tightness and shortness of breath.   Cardiovascular: Positive for chest pain.  Gastrointestinal: Negative for nausea, vomiting, abdominal pain and diarrhea.  All other systems reviewed and are negative.     Allergies  Tape and Nicoderm  Home Medications   Prior to Admission medications   Medication Sig Start Date End Date Taking? Authorizing Provider  acetaminophen (TYLENOL) 500 MG tablet Take 1,000 mg by mouth every 6 (six) hours as needed for pain.   Yes Historical Provider, MD  aspirin EC 81 MG tablet Take 81 mg by mouth daily.   Yes Historical Provider, MD  clopidogrel (PLAVIX) 75 MG tablet Take 75 mg by mouth daily.    Yes Historical Provider, MD  docusate sodium (COLACE) 100 MG capsule Take 100 mg by mouth daily.   Yes Historical Provider, MD  Fiber CAPS Take 2 capsules by mouth daily.    Yes Historical Provider, MD  fish oil-omega-3 fatty acids 1000 MG capsule Take 1 g by mouth 2 (two) times daily.    Yes Historical Provider, MD  Fluticasone Furoate-Vilanterol (BREO ELLIPTA IN) Inhale 1 puff into the lungs daily.   Yes Historical Provider, MD  insulin glargine (LANTUS) 100 UNIT/ML injection Inject 10-20 Units into the skin 2 (two) times daily. Inject 10 units each morning. Inject evening dose as follows: 16 units up to cbg of 250.  20 units if cbg is higher than 250.   Yes Historical Provider, MD  insulin glulisine (APIDRA) 100 UNIT/ML injection Inject 4-8 Units into the skin 4 (four) times daily as needed for high blood sugar. For blood sugar levels   Yes Historical Provider, MD  omeprazole (PRILOSEC) 20 MG capsule Take 20  mg by mouth daily.    Yes Historical Provider, MD  ondansetron (ZOFRAN-ODT) 4 MG disintegrating tablet Take 4 mg by mouth every 8 (eight) hours as needed for nausea.   Yes Historical Provider, MD  rosuvastatin (CRESTOR) 20 MG tablet Take 20 mg by mouth daily.   Yes Historical Provider, MD  buPROPion (WELLBUTRIN XL) 150 MG 24 hr tablet Take 150 mg by mouth daily.    Historical Provider, MD  calcium carbonate, dosed in mg elemental calcium, 1250 MG/5ML Take 500 mg of elemental calcium by mouth daily with breakfast.    Historical Provider, MD  nitroGLYCERIN (  NITROLINGUAL) 0.4 MG/SPRAY spray Place 1 spray under the tongue every 5 (five) minutes as needed for chest pain.    Historical Provider, MD   BP 82/48 mmHg  Pulse 98  Temp(Src) 100.3 F (37.9 C) (Oral)  Resp 22  Ht 5\' 6"  (1.676 m)  Wt 137 lb 8 oz (62.37 kg)  BMI 22.20 kg/m2  SpO2 97% Physical Exam  Constitutional: She is oriented to person, place, and time. She appears well-developed and well-nourished. No distress. Nasal cannula in place.  HENT:  Head: Normocephalic and atraumatic.  Right Ear: External ear normal.  Left Ear: External ear normal.  Nose: Nose normal.  Mouth/Throat: Oropharynx is clear and moist.  Eyes: Conjunctivae are normal.  Neck: Normal range of motion. Neck supple.  Cardiovascular: Regular rhythm and normal heart sounds.  Tachycardia present.   Pulmonary/Chest: Effort normal. Tachypnea noted. She has decreased breath sounds.  Productive cough with yellow sputum  Abdominal: Soft. There is no tenderness.  Musculoskeletal: Normal range of motion. She exhibits no edema.  Neurological: She is alert and oriented to person, place, and time.  Skin: Skin is warm and dry. She is not diaphoretic.  Psychiatric: She has a normal mood and affect.  Nursing note and vitals reviewed.   ED Course  Procedures (including critical care time) Medications  sodium chloride 0.9 % bolus 1,000 mL (1,000 mLs Intravenous New  Bag/Given 08/14/14 1542)  guaiFENesin-codeine 100-10 MG/5ML solution 10 mL (10 mLs Oral Given 08/14/14 1313)  ipratropium-albuterol (DUONEB) 0.5-2.5 (3) MG/3ML nebulizer solution 3 mL (3 mLs Nebulization Given 08/14/14 1314)  acetaminophen (TYLENOL) tablet 650 mg (650 mg Oral Given 08/14/14 1418)  cefTRIAXone (ROCEPHIN) 1 g in dextrose 5 % 50 mL IVPB (0 g Intravenous Stopped 08/14/14 1507)  azithromycin (ZITHROMAX) 500 mg in dextrose 5 % 250 mL IVPB (0 mg Intravenous Stopped 08/14/14 1606)  sodium chloride 0.9 % bolus 1,000 mL (0 mLs Intravenous Stopped 08/14/14 1506)  ipratropium-albuterol (DUONEB) 0.5-2.5 (3) MG/3ML nebulizer solution 3 mL (3 mLs Nebulization Given 08/14/14 1432)  0.9 %  sodium chloride infusion ( Intravenous Stopped 08/14/14 1542)    Labs Review Labs Reviewed  CBC - Abnormal; Notable for the following:    RBC 5.27 (*)    Hemoglobin 16.7 (*)    HCT 48.7 (*)    All other components within normal limits  BASIC METABOLIC PANEL - Abnormal; Notable for the following:    Sodium 133 (*)    Glucose, Bld 277 (*)    GFR calc non Af Amer 88 (*)    All other components within normal limits  DIFFERENTIAL - Abnormal; Notable for the following:    Neutrophils Relative % 95 (*)    Neutro Abs 9.5 (*)    Lymphocytes Relative 3 (*)    Lymphs Abs 0.3 (*)    Monocytes Relative 2 (*)    All other components within normal limits  CULTURE, BLOOD (ROUTINE X 2)  CULTURE, BLOOD (ROUTINE X 2)  DIFFERENTIAL  INFLUENZA PANEL BY PCR (TYPE A & B, H1N1)  LACTIC ACID, PLASMA  LACTIC ACID, PLASMA  I-STAT TROPOININ, ED  I-STAT CG4 LACTIC ACID, ED    Imaging Review Dg Chest 2 View  08/14/2014   CLINICAL DATA:  Left-sided chest pain and epigastric pain which began acutely earlier today, associated with acute worsening of chronic shortness of breath. Current history of diabetes, COPD/chronic bronchitis, coronary artery disease. Personal history of head neck cancer involving the base of the tongue with  radiation therapy.  EXAM: CHEST  2 VIEW  COMPARISON:  05/18/2014 and earlier.  FINDINGS: Cardiac silhouette normal in size, unchanged. Thoracic aorta atherosclerotic, unchanged. Hilar and mediastinal contours otherwise unremarkable. Airspace consolidation involving the left lower lobe associated with a small to moderate-sized left pleural effusion. No focal airspace consolidation elsewhere. Prominent bronchovascular markings diffusely and moderate central peribronchial thickening, unchanged. Degenerative changes involving the thoracic spine with exaggeration of the usual thoracic kyphosis, unchanged.  IMPRESSION: Acute left lower lobe pneumonia superimposed upon COPD. Associated small to moderate-sized left parapneumonic effusion.   Electronically Signed   By: Evangeline Dakin M.D.   On: 08/14/2014 13:59     EKG Interpretation None      MDM   Final diagnoses:  Community acquired pneumonia  COPD exacerbation    Filed Vitals:   08/14/14 1615  BP: 82/48  Pulse: 98  Temp:   Resp: 22   Patient presenting with fever to ED. PE showed tachycardia. Tachypnea, decreased breath sounds. Patient intially 88-89% on RA, placed on 2L Ball. Abdomen soft, non-tender, non-distended. No meningeal signs. IV fluids started. BC x 2 sent. Tylenol given for fever, with reduction. Labs reviewed. Troponin negative. Influenza panel sent. CXR suggestive of PNA with COPD exacerbation. IV Azithromycin and Rocephin administered. Patient became hypotensive, responsive to another round of IVF. Patient will be admitted to step down to [facility]. Patient d/w with Dr. Alvino Chapel, agrees with plan.     Baron Sane, PA-C 08/14/14 [place], MD 08/17/14 [phone]

## 2014-08-14 NOTE — Consult Note (Signed)
PULMONARY / CRITICAL CARE MEDICINE   Name: Brenda [name] MRN: [id number] DOB: Mar 19, 1951    ADMISSION DATE:  08/14/2014 CONSULTATION DATE:  08/14/14  REFERRING MD :  Dr. Clementeen Graham  CHIEF COMPLAINT:  Dyspnea and cough  INITIAL PRESENTATION:   STUDIES:  CXR 08/14/14: Acute left lower lobe pneumonia superimposed upon COPD. Associated small left parapneumonic effusion.  SIGNIFICANT EVENTS: Vasopressor started 08/14/14   HISTORY OF PRESENT ILLNESS:  This is a 64 y.o. female, with a past medical history of CAD (with multiple stents), PAD, carotid artery disease, CVA, COPD, diabetes, oral cancer, and DVT. Initially she has been feeling lousy the past few days with some viral respiratory symptoms (her family member was having the same thing about 2 weeks ago). She presents to the emergency department for dyspnea, cough + white sputum and chest pain and is found to have left lower lobe pneumonia, has hypotension that persists after 2 L fluid. She said normally her BP is around 110-12/60 and does not take any BP meds.This morning she felt particularly bad and developed centralized chest pain that radiated bilaterally across her ribs and up to her left shoulder. After a short period of time the pain subsided and became more of a pressure. She has also had some posttussive vomiting today.  In the ER her temperature is 101.6, pulse rate 122, respirations 30, blood pressure 78/58. She is on 2 L of oxygen.  Currently on dopamine. Lactate so far normal. Her chest pain has improved. Also her abdominal pain has improved.  Takes Breo Ellipta QD for COPD. Still smokes 1 ppd (total 50 years). No recent travel.  No diarrhea, no dizziness, no vision changes, no rash, no leg swelling, no back pain  PAST MEDICAL HISTORY :   has a past medical history of History of DVT (deep vein thrombosis); Coronary artery disease; COPD (chronic obstructive pulmonary disease); Transient ischemic attack; Depression; radiation  therapy (01/16/09 - 03/06/09); Peripheral arterial disease; Pneumonia; Hypercholesteremia; Chronic bronchitis; Shortness of breath; Type II diabetes mellitus; History of blood transfusion (1955); GERD (gastroesophageal reflux disease); H/O hiatal hernia; Stroke (2000); Arthritis; 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  has past surgical history that includes TEE without cardioversion (07/26/2011); Esophagogastroduodenoscopy (12/20/2011); Savory dilation (12/20/2011); Tonsillectomy (1969); Appendectomy (1962); Cardiac catheterization (1999; 11/26/2012); Coronary angioplasty with stent (2458--0998); Femoral bypass (1999; 2000); Carpal tunnel release (Right, 1980's?); Vaginal hysterectomy (1988); Cesarean section (1978); Tubal ligation (1988); Cataract extraction w/ intraocular lens implant (Right, 2011); Ankle fracture surgery (Right, 2007); Bypass graft popliteal to popliteal (Right, 02/05/2013); and Eye surgery. Prior to Admission medications   Medication Sig Start Date End Date Taking? Authorizing Provider  acetaminophen (TYLENOL) 500 MG tablet Take 1,000 mg by mouth every 6 (six) hours as needed for pain.   Yes Historical Provider, MD  aspirin EC 81 MG tablet Take 81 mg by mouth daily.   Yes Historical Provider, MD  clopidogrel (PLAVIX) 75 MG tablet Take 75 mg by mouth daily.    Yes Historical Provider, MD  docusate sodium (COLACE) 100 MG capsule Take 100 mg by mouth daily.   Yes Historical Provider, MD  Fiber CAPS Take 2 capsules by mouth daily.    Yes Historical Provider, MD  fish oil-omega-3 fatty acids 1000 MG capsule Take 1 g by mouth 2 (two) times daily.    Yes Historical Provider, MD  Fluticasone Furoate-Vilanterol (BREO ELLIPTA IN) Inhale 1 puff into the lungs daily.   Yes Historical  Provider, MD  insulin glargine (LANTUS) 100 UNIT/ML injection Inject 10-20 Units into the skin 2 (two) times daily. Inject 10 units each  morning. Inject evening dose as follows: 16 units up to cbg of 250.  20 units if cbg is higher than 250.   Yes Historical Provider, MD  insulin glulisine (APIDRA) 100 UNIT/ML injection Inject 4-8 Units into the skin 4 (four) times daily as needed for high blood sugar. For blood sugar levels   Yes Historical Provider, MD  omeprazole (PRILOSEC) 20 MG capsule Take 20 mg by mouth daily.    Yes Historical Provider, MD  ondansetron (ZOFRAN-ODT) 4 MG disintegrating tablet Take 4 mg by mouth every 8 (eight) hours as needed for nausea.   Yes Historical Provider, MD  rosuvastatin (CRESTOR) 20 MG tablet Take 20 mg by mouth daily.   Yes Historical Provider, MD  buPROPion (WELLBUTRIN XL) 150 MG 24 hr tablet Take 150 mg by mouth daily.    Historical Provider, MD  calcium carbonate, dosed in mg elemental calcium, 1250 MG/5ML Take 500 mg of elemental calcium by mouth daily with breakfast.    Historical Provider, MD  nitroGLYCERIN (NITROLINGUAL) 0.4 MG/SPRAY spray Place 1 spray under the tongue every 5 (five) minutes as needed for chest pain.    Historical Provider, MD   Allergies  Allergen Reactions  . Tape Hives and Other (See Comments)    USE PAPER TAPE ONLY- Adhesive peels off skin-makes pt. Raw.  Maryjo [place] [Nicotine] Rash    To the PATCH only, breakouts on skin    FAMILY HISTORY:  indicated that her mother is deceased. She indicated that her father is deceased.  SOCIAL HISTORY:  reports that she has been smoking Cigarettes.  She has a 50 pack-year smoking history. She has never used smokeless tobacco. She reports that she drinks about 6.0 oz of alcohol per week. She reports that she does not use illicit drugs.  REVIEW OF SYSTEMS:  10 points ROS was negative other than noted in HPI  SUBJECTIVE:   VITAL SIGNS: Temp:  [99.8 F (37.7 C)-101.6 F (38.7 C)] 100.3 F (37.9 C) (03/13 1507) Pulse Rate:  [81-122] 85 (03/13 1930) Resp:  [15-30] 24 (03/13 1930) BP: (72-146)/(36-95) 105/48 mmHg (03/13  1930) SpO2:  [90 %-100 %] 94 % (03/13 1930) Weight:  [62.37 kg (137 lb 8 oz)] 62.37 kg (137 lb 8 oz) (03/13 1211) HEMODYNAMICS:   VENTILATOR SETTINGS:   INTAKE / OUTPUT:  Intake/Output Summary (Last 24 hours) at 08/14/14 1945 Last data filed at 08/14/14 1727  Gross per 24 hour  Intake   3000 ml  Output      0 ml  Net   3000 ml    PHYSICAL EXAMINATION: General:  Awake, alert, oriented x 3 Neuro:  no focal weakness, no facial droop, speech normal, pupils equal and reactive HEENT:  atraumatic, no stridor Cardiovascular:  RRR, no loud murmur Lungs:  _markedly decreased breath sounds bilaterally, inspiratory rales at LLL, mild wheeze Abdomen:  Soft, nontender, no guarding Musculoskeletal:  Has chronic left ankle skin changes and some restricted range of motion Skin:  No edema, no ecchymosis  LABS:  CBC  Recent Labs Lab 08/14/14 1230  WBC 10.0  HGB 16.7*  HCT 48.7*  PLT 150   Coag's No results for input(s): APTT, INR in the last 168 hours. BMET  Recent Labs Lab 08/14/14 1230  NA 133*  K 4.5  CL 96  CO2 26  BUN 9  CREATININE 0.76  GLUCOSE 277*   Electrolytes  Recent Labs Lab 08/14/14 1230  CALCIUM 9.1   Sepsis Markers  Recent Labs Lab 08/14/14 1242 08/14/14 1620  LATICACIDVEN 1.41 1.5   ABG No results for input(s): PHART, PCO2ART, PO2ART in the last 168 hours. Liver Enzymes No results for input(s): AST, ALT, ALKPHOS, BILITOT, ALBUMIN in the last 168 hours. Cardiac Enzymes No results for input(s): TROPONINI, PROBNP in the last 168 hours. Glucose No results for input(s): GLUCAP in the last 168 hours.  Imaging No results found.   ASSESSMENT / PLAN:  PULMONARY A: septic shock from LLL PNA likely bacterial superimposition on recent viral infection, small effusion on left. Flu panel negative COPD exacerbation from above Smoker P:   Will use Rocephin + zithromax. Follow up culture results. Urine legionella Will give maintenance IVF and start  change vasopressor to levophed as soon as a central line is available Duoneb Q6 and pulmicort BID. Incentive spirometry while awake Prednisone 40 mg daily start tomorrow (already got solumedrol in ED) Strongly advised to stop smoking  CARDIOVASCULAR CVL right IJ 08/14/14 >>>\ A: Hypotension from septic shock CAD, HLD P:  Continue crestor, see above for hypotension management. Continue ASA + plavix  RENAL A:  No acute issues P:   monitor  GASTROINTESTINAL A:  GERD P:   Protonix 40 mg daily  HEMATOLOGIC A:  No acute issues History of throat cancer 5 years ago P:  monitor  INFECTIOUS A:  LLL PNA (see above) P:   BCx2 pending UC  Sputum pending Abx: rocephin 08/14/14 >>> Azithromycin 08/14/14 >>>  ENDOCRINE A:  DM P:   Continue Lantus 20 units QHS and ISS, goal BS 140-180 HbA1c  NEUROLOGIC A:  History of CVA  P:   Continue ASA + Plavix and statin RASS goal: 0    FAMILY  - Updates:   - Inter-disciplinary family meet or Palliative Care meeting due by:     TODAY'S SUMMARY: admitted for LLL PNA and small effusion, needing needs vasopressor. Baseline COPD.   Critical care time: 35 minutes   Pulmonary and [place] Pager: [phone]  08/14/2014, 7:45 PM

## 2014-08-14 NOTE — ED Notes (Signed)
Paged Harless Litten PA regarding pts BP.

## 2014-08-14 NOTE — ED Notes (Signed)
Paged [place] PA regarding BP again.

## 2014-08-14 NOTE — ED Notes (Signed)
Attempted report 

## 2014-08-14 NOTE — ED Notes (Signed)
She c/o onset CP while sitting at rest 2 hours ago. She states she has angina but this pain is lower in her chest than her normal anginal pain. She states the pain radiates to her ribs.  She took 2 nitro sprays with no relief.  she took 4 baby ASA.

## 2014-08-14 NOTE — Progress Notes (Addendum)
ANTIBIOTIC CONSULT NOTE - INITIAL  Pharmacy Consult for vancomycin and cefepime Indication: sepsis  Allergies  Allergen Reactions  . Tape Hives and Other (See Comments)    USE PAPER TAPE ONLY- Adhesive peels off skin-makes pt. Raw.  . Nicoderm [Nicotine] Rash    To the PATCH only, breakouts on skin    Patient Measurements: Height: 5\' 6"  (167.6 cm) Weight: 137 lb 8 oz (62.37 kg) IBW/kg (Calculated) : 59.3  Vital Signs: Temp: 100.3 F (37.9 C) (03/13 1507) Temp Source: Oral (03/13 1507) BP: 105/48 mmHg (03/13 1930) Pulse Rate: 85 (03/13 1930) Intake/Output from previous day:   Intake/Output from this shift:    Labs:  Recent Labs  08/14/14 1230  WBC 10.0  HGB 16.7*  PLT 150  CREATININE 0.76   Estimated Creatinine Clearance: 67.4 mL/min (by C-G formula based on Cr of 0.76). No results for input(s): VANCOTROUGH, VANCOPEAK, VANCORANDOM, GENTTROUGH, GENTPEAK, GENTRANDOM, TOBRATROUGH, TOBRAPEAK, TOBRARND, AMIKACINPEAK, AMIKACINTROU, AMIKACIN in the last 72 hours.   Microbiology: No results found for this or any previous visit (from the past 720 hour(s)).  Medical History: Past Medical History  Diagnosis Date  . History of DVT (deep vein thrombosis)   . Coronary artery disease     multiple stens  . COPD (chronic obstructive pulmonary disease)   . Transient ischemic attack   . Depression   . Hx of radiation therapy 01/16/09 - 03/06/09    base of tongue, right neck node  . Peripheral arterial disease     status post bilateral femoropopliteal bypass graft performed by Dr. Sherren Mocha early in the past with significant right lower extremity lifestyle limiting claudication  . Pneumonia   . Hypercholesteremia   . Chronic bronchitis   . Shortness of breath     "w/exertion and sometimes when laying down" (11/26/2012)  . Type II diabetes mellitus   . History of blood transfusion 1955  . GERD (gastroesophageal reflux disease)   . H/O hiatal hernia   . Stroke 2000    left side  weakness remains (11/26/2012)  . Arthritis     "hands" (11/26/2012)  . 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 Myocardial infarction 2005    dr Ellyn Hack  with [facility]  . Fatigue 08/23/2013  . Carotid artery occlusion     Medications:  See PTA medication list  Assessment: 64 y/o female who presents to the ED with dyspnea, cough, and chest pain found to have LLL PNA and COPD exacerbation. Pharmacy consulted to begin vancomycin and cefepime. Renal function is normal. She is febrile and WBC are normal. LA is 1.5.  Goal of Therapy:  Vancomycin trough level 15-20 mcg/ml Eradication of infection  Plan:  - Vancomycin 1250 mg IV now then 750 mg IV q12h - Cefepime 2 g IV q12h - Monitor renal function, clinical progress, and culture data  [facility], [place].D., BCPS Clinical Pharmacist Pager: [phone] 08/14/2014 8:10 PM  Addendum: CCM discontinued vancomycin and cefepime.  [place], Pharm.D., BCPS Clinical Pharmacist Pager: [phone] 08/14/2014 8:26 PM

## 2014-08-14 NOTE — ED Notes (Signed)
Spoke with Harless Litten PA, she is going to page Pulmonary to see pt.

## 2014-08-14 NOTE — Procedures (Signed)
Central Venous Catheter Insertion Procedure Note Brenda [name] [id number] 1951-01-28  Procedure: Insertion of Central Venous Catheter Indications: Drug and/or fluid administration  Procedure Details Consent: Risks of procedure as well as the alternatives and risks of each were explained to the (patient/caregiver).  Consent for procedure obtained. Time Out: Verified patient identification, verified procedure, site/side was marked, verified correct patient position, special equipment/implants available, medications/allergies/relevent history reviewed, required imaging and test results available.  Performed  Maximum sterile technique was used including antiseptics, cap, gloves, gown, hand hygiene, mask and sheet. Skin prep: Chlorhexidine; local anesthetic administered A antimicrobial bonded/coated triple lumen catheter was placed in the right internal jugular vein using the Seldinger technique.  Evaluation Blood flow good Complications: No apparent complications Patient did tolerate procedure well. Chest X-ray ordered to verify placement.  CXR: pending.  Brenda [name], Brenda [name] 08/14/2014, 9:52 PM

## 2014-08-14 NOTE — ED Notes (Signed)
BP systolic 87G.  Jenn Piepenbrink notified. Reclined pt to tolerable position.

## 2014-08-15 DIAGNOSIS — J13 Pneumonia due to Streptococcus pneumoniae: Secondary | ICD-10-CM

## 2014-08-15 LAB — COMPREHENSIVE METABOLIC PANEL
ALBUMIN: 2.4 g/dL — AB (ref 3.5–5.2)
ALK PHOS: 78 U/L (ref 39–117)
ALT: 17 U/L (ref 0–35)
AST: 17 U/L (ref 0–37)
Anion gap: 7 (ref 5–15)
BUN: 9 mg/dL (ref 6–23)
CALCIUM: 7.8 mg/dL — AB (ref 8.4–10.5)
CO2: 23 mmol/L (ref 19–32)
Chloride: 106 mmol/L (ref 96–112)
Creatinine, Ser: 0.55 mg/dL (ref 0.50–1.10)
GFR calc Af Amer: >90 mL/min (ref >90)
GFR calc non Af Amer: >90 mL/min (ref >90)
Glucose, Bld: 174 mg/dL — ABNORMAL HIGH (ref 70–99)
Potassium: 3.9 mmol/L (ref 3.5–5.1)
SODIUM: 136 mmol/L (ref 135–145)
TOTAL PROTEIN: 4.6 g/dL — AB (ref 6.0–8.3)
Total Bilirubin: 0.9 mg/dL (ref 0.3–1.2)

## 2014-08-15 LAB — CBC
HCT: 38 % (ref 36.0–46.0)
Hemoglobin: 12.6 g/dL (ref 12.0–15.0)
MCH: 31.2 pg (ref 26.0–34.0)
MCHC: 33.2 g/dL (ref 30.0–36.0)
MCV: 94.1 fL (ref 78.0–100.0)
Platelets: 120 10*3/uL — ABNORMAL LOW (ref 150–400)
RBC: 4.04 MIL/uL (ref 3.87–5.11)
RDW: 14.7 % (ref 11.5–15.5)
WBC: 11.8 10*3/uL — AB (ref 4.0–10.5)

## 2014-08-15 LAB — LEGIONELLA ANTIGEN, URINE

## 2014-08-15 LAB — EXPECTORATED SPUTUM ASSESSMENT W REFEX TO RESP CULTURE

## 2014-08-15 LAB — EXPECTORATED SPUTUM ASSESSMENT W GRAM STAIN, RFLX TO RESP C

## 2014-08-15 LAB — TROPONIN I: TROPONIN I: 0.03 ng/mL (ref <0.031)

## 2014-08-15 LAB — GLUCOSE, CAPILLARY
GLUCOSE-CAPILLARY: 105 mg/dL — AB (ref 70–99)
GLUCOSE-CAPILLARY: 311 mg/dL — AB (ref 70–99)
Glucose-Capillary: 210 mg/dL — ABNORMAL HIGH (ref 70–99)
Glucose-Capillary: 328 mg/dL — ABNORMAL HIGH (ref 70–99)
Glucose-Capillary: 184 mg/dL — ABNORMAL HIGH (ref 70–99)

## 2014-08-15 LAB — HIV ANTIBODY (ROUTINE TESTING W REFLEX): HIV SCREEN 4TH GENERATION: NONREACTIVE

## 2014-08-15 LAB — PHOSPHORUS: PHOSPHORUS: 2.4 mg/dL (ref 2.3–4.6)

## 2014-08-15 LAB — LACTIC ACID, PLASMA: Lactic Acid, Venous: 0.9 mmol/L (ref 0.5–2.0)

## 2014-08-15 LAB — MRSA PCR SCREENING: MRSA by PCR: POSITIVE — AB

## 2014-08-15 LAB — MAGNESIUM: Magnesium: 1.5 mg/dL (ref 1.5–2.5)

## 2014-08-15 MED ORDER — INSULIN ASPART 100 UNIT/ML ~~LOC~~ SOLN
0.0000 [IU] | Freq: Three times a day (TID) | SUBCUTANEOUS | Status: DC
  Administered 2014-08-15 (×2): 11 [IU] via SUBCUTANEOUS
  Administered 2014-08-16: 3 [IU] via SUBCUTANEOUS
  Administered 2014-08-16: 5 [IU] via SUBCUTANEOUS

## 2014-08-15 MED ORDER — CHLORHEXIDINE GLUCONATE CLOTH 2 % EX PADS
6.0000 | MEDICATED_PAD | Freq: Every day | CUTANEOUS | Status: DC
  Administered 2014-08-15 – 2014-08-17 (×3): 6 via TOPICAL

## 2014-08-15 MED ORDER — IPRATROPIUM-ALBUTEROL 0.5-2.5 (3) MG/3ML IN SOLN
3.0000 mL | Freq: Three times a day (TID) | RESPIRATORY_TRACT | Status: DC
  Administered 2014-08-15: 3 mL via RESPIRATORY_TRACT
  Filled 2014-08-15: qty 3

## 2014-08-15 MED ORDER — IPRATROPIUM-ALBUTEROL 0.5-2.5 (3) MG/3ML IN SOLN
3.0000 mL | Freq: Four times a day (QID) | RESPIRATORY_TRACT | Status: DC
  Administered 2014-08-15: 3 mL via RESPIRATORY_TRACT
  Filled 2014-08-15 (×3): qty 3

## 2014-08-15 MED ORDER — MUPIROCIN 2 % EX OINT
1.0000 "application " | TOPICAL_OINTMENT | Freq: Two times a day (BID) | CUTANEOUS | Status: DC
  Administered 2014-08-15 – 2014-08-18 (×8): 1 via NASAL
  Filled 2014-08-15 (×3): qty 22

## 2014-08-15 MED ORDER — GLUCERNA SHAKE PO LIQD
237.0000 mL | Freq: Two times a day (BID) | ORAL | Status: DC
  Administered 2014-08-15 – 2014-08-18 (×3): 237 mL via ORAL

## 2014-08-15 MED ORDER — INSULIN ASPART 100 UNIT/ML ~~LOC~~ SOLN: 0.0000 [IU] | Freq: Every day | SUBCUTANEOUS | Status: DC

## 2014-08-15 MED ORDER — IPRATROPIUM-ALBUTEROL 0.5-2.5 (3) MG/3ML IN SOLN
3.0000 mL | Freq: Three times a day (TID) | RESPIRATORY_TRACT | Status: DC
  Administered 2014-08-16 (×2): 3 mL via RESPIRATORY_TRACT
  Filled 2014-08-15 (×2): qty 3

## 2014-08-15 MED ORDER — ENOXAPARIN SODIUM 40 MG/0.4ML ~~LOC~~ SOLN
40.0000 mg | Freq: Every day | SUBCUTANEOUS | Status: DC
  Administered 2014-08-15 – 2014-08-18 (×4): 40 mg via SUBCUTANEOUS
  Filled 2014-08-15 (×4): qty 0.4

## 2014-08-15 NOTE — Progress Notes (Signed)
NAD No new complaints Cognition intact  Filed Vitals:   08/15/14 1600 08/15/14 1700 08/15/14 1937 08/15/14 2020  BP: 128/58 115/74 117/57   Pulse: 83 91 81 77  Temp:      TempSrc:      Resp: 18 13 21 20   Height:      Weight:      SpO2: 98% 95% 98% 95%   NAD HEENT WNL JVP not well visualized L basilar crackles No wheezes RRR NABS No LE edema  I have reviewed all of today's lab results. 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 Discussed with Dr Nicolette Bang, MD ; [facility] [phone].  After 5:30 PM or weekends, call [phone]

## 2014-08-15 NOTE — Evaluation (Signed)
Physical Therapy Evaluation Patient Details Name: Brenda [name] MRN: [id number] DOB: [birth year] Today's Date: 08/15/2014   History of Present Illness  This is a 64 y.o. female, with a past medical history of CAD (with multiple stents), PAD, carotid artery disease, CVA, COPD, diabetes, oral cancer, and DVT. Initially she has been feeling lousy the past few days with some viral respiratory symptoms (her family member was having the same thing about 2 weeks ago). She presents to the emergency department for dyspnea, cough + white sputum and chest pain and is found to have left lower lobe pneumonia, has hypotension  Clinical Impression  Pt very pleasant, eager to move and moving well. Reports 3 falls in last year with knees frequently buckling. Pt with sats 90-94% on 2L throughout and no hypotension with activity. Pt with below deficits who will benefit from acute therapy to maximize mobility, function, safety and strength to decrease burden of care and fall risk. Will continue to follow with Rn educated for assist with mobility and fall risk.     Follow Up Recommendations Outpatient PT    Equipment Recommendations  Rolling walker with 5" wheels    Recommendations for Other Services OT consult     Precautions / Restrictions Precautions Precautions: Fall Precaution Comments: knees buckle Restrictions Weight Bearing Restrictions: No      Mobility  Bed Mobility Overal bed mobility: Modified Independent                Transfers Overall transfer level: Needs assistance   Transfers: Sit to/from Stand Sit to Stand: Supervision         General transfer comment: cues for hand placement and safety pt slightly impulsive with getting up from bed  Ambulation/Gait Ambulation/Gait assistance: Min assist Ambulation Distance (Feet): 120 Feet (30' then pt knees buckled, sat then amb 120') Assistive device: Rolling walker (2 wheeled);None Gait Pattern/deviations: Step-through  pattern;Decreased stride length   Gait velocity interpretation: Below normal speed for age/gender General Gait Details: after initial 39' without DME pt bil knees buckled with mod assist to catch pt and prevent fall with assist to sit in chair and rest prior to continued ambulation with RW and chair followed closely. Cues for posture, RW use and positioning  Stairs            Wheelchair Mobility    Modified Rankin (Stroke Patients Only)       Balance Overall balance assessment: Needs assistance   Sitting balance-Leahy Scale: Good       Standing balance-Leahy Scale: Fair                               Pertinent Vitals/Pain Pain Assessment: No/denies pain  136/57 supine 127/60 sitting 105/76 sitting after ambulation HR 90    Home Living Family/patient expects to be discharged to:: Private residence Living Arrangements: Spouse/significant other Available Help at Discharge: Family;Available PRN/intermittently Type of Home: House Home Access: Stairs to enter Entrance Stairs-Rails: Right Entrance Stairs-Number of Steps: 3 Home Layout: Two level;Able to live on main level with bedroom/bathroom Home Equipment: Bedside commode;Shower seat      Prior Function Level of Independence: Independent         Comments: pt does all the homemaking and cares for her granddaughter several days a week. Her bedroom is downstairs but when granddaugther is there she sleeps upstairs near her     Hand Dominance        Extremity/Trunk  Assessment   Upper Extremity Assessment: Overall WFL for tasks assessed           Lower Extremity Assessment: RLE deficits/detail;LLE deficits/detail RLE Deficits / Details: hip flexion 3/5, dorsiflexion 4/5, knee extension 5/5 LLE Deficits / Details: hip flexion 3/5, dorsiflexion 4/5, knee extension 4/5     Communication   Communication: No difficulties  Cognition Arousal/Alertness: Awake/alert Behavior During Therapy: WFL  for tasks assessed/performed Overall Cognitive Status: Within Functional Limits for tasks assessed                      General Comments      Exercises General Exercises - Lower Extremity Hip Flexion/Marching: AROM;Both;15 reps      Assessment/Plan    PT Assessment Patient needs continued PT services  PT Diagnosis Difficulty walking   PT Problem List Decreased balance;Decreased knowledge of use of DME;Decreased strength;Cardiopulmonary status limiting activity  PT Treatment Interventions Gait training;Stair training;Functional mobility training;Therapeutic activities;Therapeutic exercise;Patient/family education;DME instruction;Balance training   PT Goals (Current goals can be found in the Care Plan section) Acute Rehab PT Goals Patient Stated Goal: return home and smoke PT Goal Formulation: With patient Time For Goal Achievement: 08/29/14 Potential to Achieve Goals: Good    Frequency Min 3X/week   Barriers to discharge Decreased caregiver support      Co-evaluation               End of Session Equipment Utilized During Treatment: Gait belt;Oxygen Activity Tolerance: Patient tolerated treatment well Patient left: in chair;with call bell/phone within reach Nurse Communication: Mobility status;Precautions         Time: 7622-6333 PT Time Calculation (min) (ACUTE ONLY): 24 min   Charges:   PT Evaluation $Initial PT Evaluation Tier I: 1 Procedure PT Treatments $Gait Training: 8-22 mins   PT G Codes:        Melford Aase 08/15/2014, 9:36 AM Elwyn Reach, [place]

## 2014-08-15 NOTE — Evaluation (Signed)
Occupational Therapy Evaluation Patient Details Name: Brenda [name] MRN: [id number] DOB: 06-Jun-1950 Today's Date: 08/15/2014    History of Present Illness This is a 64 y.o. female, with a past medical history of CAD (with multiple stents), PAD, carotid artery disease, CVA, COPD, diabetes, oral cancer, and DVT. Initially she has been feeling lousy the past few days with some viral respiratory symptoms (her family member was having the same thing about 2 weeks ago). She presents to the emergency department for dyspnea, cough + white sputum and chest pain and is found to have left lower lobe pneumonia, has hypotension   Clinical Impression   Pt admitted with above. She demonstrates the below listed deficits and will benefit from continued OT to maximize safety and independence with BADLs.  Pt presents to OT with generalized weakness. Currently, she requires min A for BADLs. 02 sats initially maintaining at 90-92% on RA, but dropped to 86% as she fatigued - supplemental 02 reapplied. Feel she will progress back to modified independent level with BADLs.       Follow Up Recommendations  Supervision/Assistance - 24 hour;No OT follow up    Equipment Recommendations  None recommended by OT    Recommendations for Other Services       Precautions / Restrictions Precautions Precautions: Fall      Mobility Bed Mobility                  Transfers Overall transfer level: Needs assistance   Transfers: Sit to/from Stand;Stand Pivot Transfers Sit to Stand: Min guard Stand pivot transfers: Min guard       General transfer comment: assist to steady     Balance Overall balance assessment: Needs assistance Sitting-balance support: Feet supported Sitting balance-Leahy Scale: Good     Standing balance support: During functional activity Standing balance-Leahy Scale: Fair                              ADL Overall ADL's : Needs assistance/impaired Eating/Feeding:  Independent;Sitting   Grooming: Wash/dry hands;Standing;Min guard   Upper Body Bathing: Set up;Supervision/ safety;Sitting   Lower Body Bathing: Minimal assistance;Sit to/from stand   Upper Body Dressing : Set up;Sitting   Lower Body Dressing: Minimal assistance;Sit to/from stand   Toilet Transfer: Minimal assistance;Ambulation;Comfort height toilet;Grab bars   Toileting- Clothing Manipulation and Hygiene: Minimal assistance;Sit to/from stand       Functional mobility during ADLs: Minimal assistance (in the room ) General ADL Comments: Pt fatigues quickly with activity.  02 sats initially remaining 90-92% on RA, but as she fatigues, sats drop to 86%.  Pt moves quickly requiring cues for safety      Vision     Perception     Praxis      Pertinent Vitals/Pain Pain Assessment: No/denies pain     Hand Dominance Right   Extremity/Trunk Assessment Upper Extremity Assessment Upper Extremity Assessment: Overall WFL for tasks assessed   Lower Extremity Assessment Lower Extremity Assessment: Defer to PT evaluation       Communication Communication Communication: No difficulties   Cognition Arousal/Alertness: Awake/alert Behavior During Therapy: WFL for tasks assessed/performed Overall Cognitive Status: Within Functional Limits for tasks assessed                     General Comments       Exercises Exercises: Other exercises Other Exercises Other Exercises: Pt performed 10 reps shoulder flexion bil. while standing  Shoulder Instructions      Home Living Family/patient expects to be discharged to:: Private residence Living Arrangements: Spouse/significant other Available Help at Discharge: Family;Available PRN/intermittently Type of Home: House Home Access: Stairs to enter [employer] of Steps: 3 Entrance Stairs-Rails: Right Home Layout: Two level;Able to live on main level with bedroom/bathroom Alternate Level Stairs-Number of Steps:  13 Alternate Level Stairs-Rails: Left Bathroom Shower/Tub: Tub/shower unit Shower/tub characteristics: Architectural technologist: Standard Bathroom Accessibility: Yes How Accessible: Accessible via walker Home Equipment: Bedside commode;Shower seat          Prior Functioning/Environment Level of Independence: Independent             OT Diagnosis: Generalized weakness   OT Problem List: Decreased strength;Decreased activity tolerance;Impaired balance (sitting and/or standing);Decreased knowledge of use of DME or AE;Cardiopulmonary status limiting activity   OT Treatment/Interventions: Self-care/ADL training;DME and/or AE instruction;Therapeutic activities;Patient/family education;Balance training    OT Goals(Current goals can be found in the care plan section) Acute Rehab OT Goals Patient Stated Goal: return home  OT Goal Formulation: With patient Time For Goal Achievement: 08/29/14 Potential to Achieve Goals: Good ADL Goals Pt Will Perform Grooming: with modified independence;standing Pt Will Perform Upper Body Bathing: with modified independence;sitting Pt Will Perform Lower Body Bathing: with modified independence;sit to/from stand Pt Will Perform Upper Body Dressing: with modified independence;sitting Pt Will Perform Lower Body Dressing: with modified independence;sit to/from stand Pt Will Transfer to Toilet: with modified independence;ambulating;regular height toilet;bedside commode;grab bars Pt Will Perform Toileting - Clothing Manipulation and hygiene: with modified independence;sit to/from stand Pt Will Perform Tub/Shower Transfer: Tub transfer;with supervision;ambulating;shower seat;rolling walker  OT Frequency: Min 2X/week   Barriers to D/C:            Co-evaluation              End of Session Equipment Utilized During Treatment: Oxygen Nurse Communication: Mobility status  Activity Tolerance: Patient limited by fatigue Patient left: in chair;with  call bell/phone within reach;with nursing/sitter in room   Time: 1350-1411 OT Time Calculation (min): 21 min Charges:  OT General Charges $OT Visit: 1 Procedure OT Evaluation $Initial OT Evaluation Tier I: 1 Procedure G-Codes:    [name] 30-Aug-2014, 4:54 PM

## 2014-08-15 NOTE — Progress Notes (Signed)
UR Completed.  [phone]  

## 2014-08-15 NOTE — Progress Notes (Signed)
INITIAL NUTRITION ASSESSMENT  DOCUMENTATION CODES Per approved criteria  -Not Applicable   INTERVENTION: -D/C Ensure Complete -Glucerna po BID providing 220 kcal and 10 grams of protein each  NUTRITION DIAGNOSIS: Inadequate oral intake related to decreased appetite as evidenced by difficulty swallowing.   Goal: Pt to meet >/= 90% of estimated needs  Monitor:  Pt PO intake, supplement acceptance/tolerance, weight trends, labs  Reason for Assessment: MST  64 y.o. female  Admitting Dx: Sepsis associated hypotension  ASSESSMENT: Pt with a past medical history of CAD (with multiple stents), PAD, carotid artery disease, CVA, COPD, diabetes, oral cancer, and DVT.  Admitted with CAP.  Pt with poor appetite, Ensure ordered, will d/c and order Glucerna due to DM.    Pt reports she has had difficulty swallowing since her throat cancer 5 years ago.  Has trouble swallowing things like lettuce and ground beef.  Pt states she will have two meals a day, breakfast of sausage or bacon and eggs and then a meat and vegetables at dinner.  Suggested drinking Glucerna at home.    Pt states weight will fluctuate depending on her ability to swallow.  Says she will go through period where she can swallow easily and then have periods when she can't and her weight will reflect that.  Per weight records pt has gained 9 lbs in the past 3 months.    No signs of fat or muscle wasting  Height: Ht Readings from Last 1 Encounters:  08/15/14 5\' 6"  (1.676 m)    Weight: Wt Readings from Last 1 Encounters:  08/15/14 142 lb 3.2 oz (64.5 kg)    Ideal Body Weight: 130 lbs (59 kg)  % Ideal Body Weight: 109%  Wt Readings from Last 10 Encounters:  08/15/14 142 lb 3.2 oz (64.5 kg)  06/08/14 133 lb (60.328 kg)  11/23/13 140 lb (63.504 kg)  08/23/13 132 lb 12.8 oz (60.238 kg)  06/08/13 130 lb 3.2 oz (59.058 kg)  05/25/13 134 lb 14.4 oz (61.19 kg)  02/23/13 135 lb (61.236 kg)  02/05/13 134 lb 7.7 oz (61 kg)   01/26/13 136 lb (61.689 kg)  12/24/12 134 lb (60.782 kg)    Usual Body Weight: 135 lbs  % Usual Body Weight: 105%  BMI:  Body mass index is 22.96 kg/(m^2).  Estimated Nutritional Needs: Kcal: 1850-2050 kcal  Protein: 95-110 g protein Fluid: >/= 1.85  Skin: Incision on right thigh and leg  Diet Order: Diet Carb Modified  EDUCATION NEEDS: -No education needs identified at this time   Intake/Output Summary (Last 24 hours) at 08/15/14 0955 Last data filed at 08/15/14 0830  Gross per 24 hour  Intake 5122.6 ml  Output   1775 ml  Net 3347.6 ml    Last BM: none charted   Labs:   Recent Labs Lab 08/14/14 1230 08/15/14 0334  NA 133* 136  K 4.5 3.9  CL 96 106  CO2 26 23  BUN 9 9  CREATININE 0.76 0.55  CALCIUM 9.1 7.8*  MG  --  1.5  PHOS  --  2.4  GLUCOSE 277* 174*    CBG (last 3)   Recent Labs  08/14/14 2154 08/15/14 0813  GLUCAP 236* 105*    Scheduled Meds: . antiseptic oral rinse  7 mL Mouth Rinse q12n4p  . aspirin EC  81 mg Oral Daily  . azithromycin  500 mg Intravenous Once  . budesonide (PULMICORT) nebulizer solution  0.5 mg Nebulization BID  . buPROPion  150 mg  Oral Daily  . cefTRIAXone (ROCEPHIN)  IV  1 g Intravenous Once  . chlorhexidine  15 mL Mouth Rinse BID  . Chlorhexidine Gluconate Cloth  6 each Topical Q0600  . clopidogrel  75 mg Oral Daily  . docusate sodium  100 mg Oral Daily  . feeding supplement (ENSURE COMPLETE)  237 mL Oral BID BM  . folic acid  1 mg Oral Daily  . guaiFENesin  600 mg Oral BID  . heparin  5,000 Units Subcutaneous 3 times per day  . insulin glargine  20 Units Subcutaneous QHS  . ipratropium-albuterol  3 mL Nebulization TID  . multivitamin with minerals  1 tablet Oral Daily  . mupirocin ointment  1 application Nasal BID  . omega-3 acid ethyl esters  1 g Oral BID  . pantoprazole  40 mg Oral Daily  . predniSONE  40 mg Oral Q breakfast  . rosuvastatin  20 mg Oral QHS  . sodium chloride  3 mL Intravenous Q12H  .  thiamine  100 mg Oral Daily   Or  . thiamine  100 mg Intravenous Daily    Continuous Infusions: . lactated ringers 1,610 application (96/04/54 0981)  . norepinephrine (LEVOPHED) Adult infusion Stopped (08/15/14 1914)    Past Medical History  Diagnosis Date  . History of DVT (deep vein thrombosis)   . Coronary artery disease     multiple stens  . COPD (chronic obstructive pulmonary disease)   . Transient ischemic attack   . Depression   . Hx of radiation therapy 01/16/09 - 03/06/09    base of tongue, right neck node  . Peripheral arterial disease     status post bilateral femoropopliteal bypass graft performed by Dr. Sherren Mocha early in the past with significant right lower extremity lifestyle limiting claudication  . Pneumonia   . Hypercholesteremia   . Chronic bronchitis   . Shortness of breath     "w/exertion and sometimes when laying down" (11/26/2012)  . Type II diabetes mellitus   . History of blood transfusion 1955  . GERD (gastroesophageal reflux disease)   . H/O hiatal hernia   . Stroke 2000    left side weakness remains (11/26/2012)  . Arthritis     "hands" (11/26/2012)  . 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 Myocardial infarction 2005    dr Ellyn Hack  with [facility]  . Fatigue 08/23/2013  . Carotid artery occlusion     Past Surgical History  Procedure Laterality Date  . Tee without cardioversion  07/26/2011    Procedure: TRANSESOPHAGEAL ECHOCARDIOGRAM (TEE);  Surgeon: Pixie Casino, MD;  [facility]: [facility] ENDOSCOPY;  Service: Cardiovascular;  Laterality: N/A;  . Esophagogastroduodenoscopy  12/20/2011    Procedure: ESOPHAGOGASTRODUODENOSCOPY (EGD);  Surgeon: Beryle Beams, MD;  Location: [facility] ENDOSCOPY;  Service: Endoscopy;  Laterality: N/A;  EGD with balloon dilation  . Savory dilation  12/20/2011    Procedure: SAVORY DILATION;  Surgeon: Beryle Beams, MD;  Location: [facility] ENDOSCOPY;  Service: Endoscopy;  Laterality: N/A;  . Tonsillectomy   1969  . Appendectomy  1962  . Cardiac catheterization  1999; 11/26/2012  . Coronary angioplasty with stent placement  1999--2005    "i think I have 3-5 total" (11/26/2012)  . Femoral bypass  1999; 2000    Archie Endo 11/26/2012  . Carpal tunnel release Right 1980's?  . Vaginal hysterectomy  1988  . Cesarean section  1978  . Tubal ligation  1988  .  Cataract extraction w/ intraocular lens implant Right 2011  . Ankle fracture surgery Right 2007    "got a metal plate and 8 screws in it" (11/26/2012)  . Bypass graft popliteal to popliteal Right 02/05/2013    Procedure: BYPASS GRAFT ABOVE KNEE POPLITEAL TO BELOW KNEE POPLITEAL WITH SMALL SAPHANOUS VEIN;  Surgeon: Rosetta Posner, MD;  Location: [place];  Service: Vascular;  Laterality: Right;  . Eye surgery      Elmer Picker MS Dietetic Intern Pager Number [phone]

## 2014-08-15 NOTE — Consult Note (Addendum)
WOC wound consult note Reason for Consult: Consult requested for left leg, right toes, and right ankle.   Wound type: No open wound or drainage noted to left leg.  Space between right 4th and 5th toes previously had an open area from moisture, according to patient; this has resolved and there is dry intact skin to this site.  Pt states right outer ankle has a pin under the skin from previous ortho surgery; it has rubbed and caused a dry callus .2X.2cm; no open wound or drainage, surrounded by intact scar tissue. Dressing procedure/placement/frequency: No topical treatment needed to any of thse locations at this time. Please re-consult if further assistance is needed.  Thank-you,  Julien Girt MSN, [place], [place], [place], Brenda [name]

## 2014-08-16 ENCOUNTER — Inpatient Hospital Stay (HOSPITAL_COMMUNITY): Payer: Medicare Other

## 2014-08-16 DIAGNOSIS — Z8673 Personal history of transient ischemic attack (TIA), and cerebral infarction without residual deficits: Secondary | ICD-10-CM

## 2014-08-16 DIAGNOSIS — E785 Hyperlipidemia, unspecified: Secondary | ICD-10-CM

## 2014-08-16 DIAGNOSIS — F1010 Alcohol abuse, uncomplicated: Secondary | ICD-10-CM

## 2014-08-16 DIAGNOSIS — J441 Chronic obstructive pulmonary disease with (acute) exacerbation: Secondary | ICD-10-CM | POA: Diagnosis present

## 2014-08-16 DIAGNOSIS — J189 Pneumonia, unspecified organism: Secondary | ICD-10-CM | POA: Diagnosis present

## 2014-08-16 DIAGNOSIS — R079 Chest pain, unspecified: Secondary | ICD-10-CM

## 2014-08-16 DIAGNOSIS — Z720 Tobacco use: Secondary | ICD-10-CM

## 2014-08-16 LAB — CBC
HCT: 37.6 % (ref 36.0–46.0)
Hemoglobin: 12.4 g/dL (ref 12.0–15.0)
MCH: 30.8 pg (ref 26.0–34.0)
MCHC: 33 g/dL (ref 30.0–36.0)
MCV: 93.5 fL (ref 78.0–100.0)
Platelets: 104 10*3/uL — ABNORMAL LOW (ref 150–400)
RBC: 4.02 MIL/uL (ref 3.87–5.11)
RDW: 14.6 % (ref 11.5–15.5)
WBC: 5.4 10*3/uL (ref 4.0–10.5)

## 2014-08-16 LAB — URINE CULTURE
Colony Count: NO GROWTH
Culture: NO GROWTH

## 2014-08-16 LAB — COMPREHENSIVE METABOLIC PANEL
ALK PHOS: 221 U/L — AB (ref 39–117)
ALT: 71 U/L — AB (ref 0–35)
AST: 97 U/L — AB (ref 0–37)
Albumin: 2.5 g/dL — ABNORMAL LOW (ref 3.5–5.2)
Anion gap: 3 — ABNORMAL LOW (ref 5–15)
BUN: 8 mg/dL (ref 6–23)
CO2: 34 mmol/L — AB (ref 19–32)
Calcium: 8.4 mg/dL (ref 8.4–10.5)
Chloride: 96 mmol/L (ref 96–112)
Creatinine, Ser: 0.79 mg/dL (ref 0.50–1.10)
GFR calc Af Amer: >90 mL/min (ref >90)
GFR calc non Af Amer: 87 mL/min — ABNORMAL LOW (ref >90)
GLUCOSE: 237 mg/dL — AB (ref 70–99)
POTASSIUM: 4 mmol/L (ref 3.5–5.1)
SODIUM: 133 mmol/L — AB (ref 135–145)
Total Bilirubin: 0.9 mg/dL (ref 0.3–1.2)
Total Protein: 5.1 g/dL — ABNORMAL LOW (ref 6.0–8.3)

## 2014-08-16 LAB — GLUCOSE, CAPILLARY
Glucose-Capillary: 85 mg/dL (ref 70–99)
Glucose-Capillary: 161 mg/dL — ABNORMAL HIGH (ref 70–99)
Glucose-Capillary: 224 mg/dL — ABNORMAL HIGH (ref 70–99)
Glucose-Capillary: 174 mg/dL — ABNORMAL HIGH (ref 70–99)

## 2014-08-16 LAB — HEMOGLOBIN A1C
HEMOGLOBIN A1C: 9.6 % — AB (ref 4.8–5.6)
HEMOGLOBIN A1C: 9.3 % — AB (ref 4.8–5.6)
MEAN PLASMA GLUCOSE: 229 mg/dL
Mean Plasma Glucose: 220 mg/dL

## 2014-08-16 LAB — BLOOD GAS, ARTERIAL
ACID-BASE EXCESS: 2.7 mmol/L — AB (ref 0.0–2.0)
Bicarbonate: 26.8 mEq/L — ABNORMAL HIGH (ref 20.0–24.0)
Drawn by: 398981
O2 Content: 3 L/min
O2 SAT: 96.7 %
PCO2 ART: 41.7 mmHg (ref 35.0–45.0)
Patient temperature: 98.6
TCO2: 28.1 mmol/L (ref 0–100)
pH, Arterial: 7.424 (ref 7.350–7.450)
pO2, Arterial: 84.1 mmHg (ref 80.0–100.0)

## 2014-08-16 LAB — MAGNESIUM: Magnesium: 1.8 mg/dL (ref 1.5–2.5)

## 2014-08-16 MED ORDER — DEXTROSE 5 % IV SOLN
500.0000 mg | INTRAVENOUS | Status: DC
  Administered 2014-08-16: 500 mg via INTRAVENOUS
  Filled 2014-08-16 (×2): qty 500

## 2014-08-16 MED ORDER — METHYLPREDNISOLONE SODIUM SUCC 125 MG IJ SOLR
60.0000 mg | INTRAMUSCULAR | Status: DC
  Administered 2014-08-16: 60 mg via INTRAVENOUS
  Filled 2014-08-16: qty 0.96
  Filled 2014-08-16: qty 2

## 2014-08-16 MED ORDER — INSULIN ASPART 100 UNIT/ML ~~LOC~~ SOLN
0.0000 [IU] | SUBCUTANEOUS | Status: DC
  Administered 2014-08-16: 4 [IU] via SUBCUTANEOUS
  Administered 2014-08-17: 3 [IU] via SUBCUTANEOUS

## 2014-08-16 MED ORDER — CEFTRIAXONE SODIUM IN DEXTROSE 20 MG/ML IV SOLN
1.0000 g | INTRAVENOUS | Status: DC
  Administered 2014-08-16: 1 g via INTRAVENOUS
  Filled 2014-08-16 (×2): qty 50

## 2014-08-16 MED ORDER — IPRATROPIUM-ALBUTEROL 0.5-2.5 (3) MG/3ML IN SOLN
3.0000 mL | Freq: Four times a day (QID) | RESPIRATORY_TRACT | Status: DC
  Administered 2014-08-16 – 2014-08-17 (×4): 3 mL via RESPIRATORY_TRACT
  Filled 2014-08-16 (×4): qty 3

## 2014-08-16 MED ORDER — DM-GUAIFENESIN ER 30-600 MG PO TB12
1.0000 | ORAL_TABLET | Freq: Two times a day (BID) | ORAL | Status: DC
  Administered 2014-08-16 – 2014-08-18 (×4): 1 via ORAL
  Filled 2014-08-16 (×5): qty 1

## 2014-08-16 NOTE — Care Management Note (Addendum)
    Page 1 of 2   08/18/2014     4:56:38 PM CARE MANAGEMENT NOTE 08/18/2014  Patient:  Brenda [name], Brenda [name]   Account Number:  [id number]  Date Initiated:  08/15/2014  Documentation initiated by:  Luz Lex  Subjective/Objective Assessment:   Admitted wtih sepsis - hypotension.     Action/Plan:   pt eval- rec outpt pt   Anticipated DC Date:  08/18/2014   Anticipated DC Plan:  HOME/SELF CARE      DC Planning Services  CM consult      PAC Choice  DURABLE MEDICAL EQUIPMENT   Choice offered to / List presented to:  C-1 Patient   DME arranged  [facility]      DME agency  [place].        Status of service:  Completed, signed off Medicare Important Message given?  YES (If response is "NO", the following Medicare IM given date fields will be blank) Date Medicare IM given:  08/16/2014 Medicare IM given by:  Tomi Bamberger Date Additional Medicare IM given:   Additional Medicare IM given by:    Discharge Disposition:  HOME/SELF CARE  Per UR Regulation:  Reviewed for med. necessity/level of care/duration of stay  If discussed at [place] of Stay Meetings, dates discussed:    Comments:  ContactBrieana, Brenda [name] [phone]  [phone]   Brenda [name], Brenda [name] Daughter [phone]   08/18/14 [place], BSN [phone] patient is for dc today, has home oxygen set up with [facility] , Jermaine brought oxygen tank to patient's room and will facilitate home set up.  08/17/14 [place], BSN [phone] per physical herapy rec out pt physical therapy, patient set up thru [facility] with [facility].  NCM gave patient information of location and phone number.

## 2014-08-16 NOTE — Progress Notes (Signed)
Occupational Therapy Treatment Patient Details Name: Brenda [name] MRN: [id number] DOB: 07/13/1950 Today's Date: 08/16/2014    History of present illness This is a 64 y.o. female, with a past medical history of CAD (with multiple stents), PAD, carotid artery disease, CVA, COPD, diabetes, oral cancer, and DVT. Initially she has been feeling lousy the past few days with some viral respiratory symptoms (her family member was having the same thing about 2 weeks ago). She presents to the emergency department for dyspnea, cough + white sputum and chest pain and is found to have left lower lobe pneumonia, has hypotension   OT comments  Pt progressing. Education provided in session. Pt not on RA when OT arrived and O2 in high 70's-80's. Cues for deep breathing technique. Placed pt on 2L of O2 and then bumped up again in session to 3L of O2. O2 in 90's at end of session on 3L of O2.  Follow Up Recommendations  Supervision/Assistance - 24 hour;No OT follow up    Equipment Recommendations  None recommended by OT    Recommendations for Other Services      Precautions / Restrictions Precautions Precautions: Fall Restrictions Weight Bearing Restrictions: No       Mobility Bed Mobility Overal bed mobility: Modified Independent                Transfers Overall transfer level: Needs assistance   Transfers: Sit to/from Stand Sit to Stand: Supervision              Balance    Supervision for mobility.                               ADL Overall ADL's : Needs assistance/impaired     Grooming: Oral care;Wash/dry face;Brushing hair;Set up;Supervision/safety;Standing   Upper Body Bathing: Set up;Supervision/ safety;Standing   Lower Body Bathing: Min guard;Sit to/from stand (washed peri area after urinating)       Lower Body Dressing: Set up;Supervision/safety;Sitting/lateral leans (pt donned socks; OT doffed socks for pt at end of session as pt was already in  bed)   Toilet Transfer: Supervision/safety;Ambulation;Regular Museum/gallery exhibitions officer and Hygiene: Min guard;Sit to/from stand (washed peri area with washcloth after urinating)       Functional mobility during ADLs: Supervision/safety General ADL Comments: Educated on energy conservation techniques. Pt performed ADLs in session. Cues for deep breathing technique.Explained benefit of getting up/activity.      Vision                     Perception     Praxis      Cognition  Awake/Alert Behavior During Therapy: WFL for tasks assessed/performed Overall Cognitive Status: Within Functional Limits for tasks assessed                       Extremity/Trunk Assessment               Exercises     Shoulder Instructions       General Comments      Pertinent Vitals/ Pain       Pain Assessment: 0-10 Pain Score: 3  Pain Location: chest when breathing deeply Pain Descriptors / Indicators: Sore Pain Intervention(s): Monitored during session   Pt not on RA when OT arrived and O2 in high 70's-80's. Cues for deep breathing technique. Placed pt on 2L of O2 and then bumped up  again in session to 3L of O2. O2 in 90's at end of session on 3L of O2.  Home Living                                          Prior Functioning/Environment              Frequency Min 2X/week     Progress Toward Goals  OT Goals(current goals can now be found in the care plan section)  Progress towards OT goals: Progressing toward goals  Acute Rehab OT Goals Patient Stated Goal: wants to go home and get out of here OT Goal Formulation: With patient Time For Goal Achievement: 08/29/14 Potential to Achieve Goals: Good ADL Goals Pt Will Perform Grooming: with modified independence;standing Pt Will Perform Upper Body Bathing: with modified independence;sitting Pt Will Perform Lower Body Bathing: with modified independence;sit to/from stand Pt  Will Perform Upper Body Dressing: with modified independence;sitting Pt Will Perform Lower Body Dressing: with modified independence;sit to/from stand Pt Will Transfer to Toilet: with modified independence;ambulating;regular height toilet;bedside commode;grab bars Pt Will Perform Toileting - Clothing Manipulation and hygiene: with modified independence;sit to/from stand Pt Will Perform Tub/Shower Transfer: Tub transfer;with supervision;ambulating;shower seat;rolling walker  Plan Discharge plan remains appropriate    Co-evaluation                 End of Session Equipment Utilized During Treatment: Gait belt;Oxygen   Activity Tolerance Patient limited by fatigue   Patient Left in bed;with call bell/phone within reach;with family/visitor present   Nurse Communication Other (comment) (in room for part of session-OT bumped pt up to 3L of O2)        Time: 1287-8676 OT Time Calculation (min): 18 min  Charges: OT General Charges $OT Visit: 1 Procedure OT Treatments $Self Care/Home Management : 8-22 mins    Benito Mccreedy  OTR/L [phone]  08/16/2014, 2:07 PM

## 2014-08-16 NOTE — Progress Notes (Signed)
[facility] TEAM 1 - Stepdown/ICU TEAM Progress Note  Brenda [name] ACZ:[id number] DOB: [birth year] DOA: 08/14/2014 PCP: Dwan Bolt, MD  Admit HPI / Brief Narrative: Brenda [name] 64 y.o.WF PMHx CAD (with multiple stents), PAD, carotid artery disease, CVA, COPD, diabetes, oral cancer, alcoholism, tobacco abuse (continues to smoke) and DVT.  Presents to the emergency department for chest pain and is found to have sepsis from left lower lobe pneumonia with associated hypotension. Brenda [name] reports she has been feeling poorly for several days and has had a cough. She is bringing up white sputum. This morning she felt particularly bad and developed centralized chest pain that radiated bilaterally across her ribs and up to her left shoulder. After a short period of time the pain subsided and became more of a pressure. She continues to have the pressure currently (it's been 6 hours). She has also had some posttussive vomiting today. She is having difficulty with acid reflux and takes omeprazole at home.  In the ER her temperature is 101.6, pulse rate 122, respirations 30, blood pressure 78/58. She is on 2 L of oxygen. She has received 2 L of normal saline and her blood pressure is currently 85/49. Chest x-ray shows left lower lobe infiltrate. Her white count is not elevated, and her lactic acid is within normal limits. We're admitting her to stepdown for sepsis associated with hypotension secondary to left lower lobe pneumonia and COPD exacerbation. We have asked pulmonary critical care to consult as she will likely need pressors.  HPI/Subjective: 3/15 A/O 4, acute on chronic respiratory distress. Patient feels positive SOB, positive CP (feels secondary to coughing), negative N/V, negative F/C.  Assessment/Plan: Severe sepsis associated with hypotension. -Normal saline 75 ml/hr  -Blood cultures;  -urine cultures negative   Left lower lobe pneumonia -Influenza PCR negative.  -HIV  negative -Legionella urine antigen negative -Strep pneumo urine antigen positive  COPD exacerbation/CAP -Solu-Medrol 60 every daily  -DuoNebQID -Continue antibiotics for full ten-day course  ,  -Continuous pulse ox; titrate O2 to maintain SPO2 89-93% -Flutter valve -Mucinex DM BID  Chest pain -Likely due to pneumonia and COPD. -Troponin 3 negative . -Echocardiogram pending  -Continue Plavix for history of stents.  Tobacco abuse -Counseled to stop smoking. Patient unable to tolerate a nicotine patch.  Alcohol use -Continue CIWA protocol with when necessary Ativan  Diabetes mellitus uncontrolled -3/13 hemoglobin A1c= 9.3 -Continue Lantus 20 units daily at bedtime - Increase to resistant SSI   Hyperlipidemia -Continue Crestor. 20mg  daily -Lipid panel     Code Status: FULL Family Communication: no family present at time of exam Disposition Plan: Resolution sepsis    Consultants: NA  Procedure/Significant Events:  3/15 CXR;Questionable improvement in left basilar consolidation.- Small bilateral pleural effusions   Culture 3/13 blood right antecubital/forearm NGTD 3/13 MRSA positive by PCR 3/13 urine negative 3/13 strep pneumo urine antigen positive 3/13 Legionella urinary antigen negative 3/14 invalid sputum 3/13 influenza A/B/H1N1 negative  Antibiotics: Azithromycin 3/15>> Ceftriaxone 3/15    DVT prophylaxis: Lovenox   Devices    LINES / TUBES:  3/13 right IJ triple-lumen    Continuous Infusions:   Objective: VITAL SIGNS: Temp: 97.9 F (36.6 C) (03/15 1701) Temp Source: Oral (03/15 1701) BP: 117/65 mmHg (03/15 1701) Pulse Rate: 92 (03/15 1701) SPO2; FIO2:   Intake/Output Summary (Last 24 hours) at 08/16/14 1808 Last data filed at 08/16/14 1542  Gross per 24 hour  Intake   1197 ml  Output    600 ml  Net    597 ml     Exam: General: A/O 4, acute on chronic respiratory distress, Lungs: tachypneic, poor air movement in all  lobes, mild expiratory wheezing concentrated at apices, without wheezes or crackles Cardiovascular: Tachycardic, Regular rate and rhythm without murmur gallop or rub normal S1 and S2 Abdomen: Nontender, nondistended, soft, bowel sounds positive, no rebound, no ascites, no appreciable mass Extremities: No significant cyanosis, clubbing, or edema bilateral lower extremities  Data Reviewed: Basic Metabolic Panel:  Recent Labs Lab 08/14/14 1230 08/15/14 0334  NA 133* 136  K 4.5 3.9  CL 96 106  CO2 26 23  GLUCOSE 277* 174*  BUN 9 9  CREATININE 0.76 0.55  CALCIUM 9.1 7.8*  MG  --  1.5  PHOS  --  2.4   Liver Function Tests:  Recent Labs Lab 08/15/14 0334  AST 17  ALT 17  ALKPHOS 78  BILITOT 0.9  PROT 4.6*  ALBUMIN 2.4*   No results for input(s): LIPASE, AMYLASE in the last 168 hours. No results for input(s): AMMONIA in the last 168 hours. CBC:  Recent Labs Lab 08/14/14 1230 08/15/14 0334  WBC 10.0 11.8*  NEUTROABS 9.5*  --   HGB 16.7* 12.6  HCT 48.7* 38.0  MCV 92.4 94.1  PLT 150 120*   Cardiac Enzymes:  Recent Labs Lab 08/14/14 2113 08/15/14 0334 08/15/14 0820  TROPONINI <0.03 <0.03 0.03   BNP (last 3 results) No results for input(s): BNP in the last 8760 hours.  ProBNP (last 3 results) No results for input(s): PROBNP in the last 8760 hours.  CBG:  Recent Labs Lab 08/15/14 1756 08/15/14 2109 08/16/14 0806 08/16/14 1200 08/16/14 1653  GLUCAP 328* 184* 85 161* 224*    Recent Results (from the past 240 hour(s))  Culture, blood (routine x 2)     Status: None (Preliminary result)   Collection Time: 08/14/14 12:30 PM  Result Value Ref Range Status   Specimen Description BLOOD RIGHT ANTECUBITAL  Final   Special Requests BOTTLES DRAWN AEROBIC AND ANAEROBIC 5CC   Final   Culture   Final           BLOOD CULTURE RECEIVED NO GROWTH TO DATE CULTURE WILL BE HELD FOR 5 DAYS BEFORE ISSUING A FINAL NEGATIVE REPORT Performed at [facility]     Report Status PENDING  Incomplete  Culture, blood (routine x 2)     Status: None (Preliminary result)   Collection Time: 08/14/14  2:25 PM  Result Value Ref Range Status   Specimen Description BLOOD BLOOD RIGHT FOREARM  Final   Special Requests BOTTLES DRAWN AEROBIC AND ANAEROBIC 10 CC  Final   Culture   Final           BLOOD CULTURE RECEIVED NO GROWTH TO DATE CULTURE WILL BE HELD FOR 5 DAYS BEFORE ISSUING A FINAL NEGATIVE REPORT Note: Culture results may be compromised due to an excessive volume of blood received in culture bottles. Performed at [facility]    Report Status PENDING  Incomplete  MRSA PCR Screening     Status: Abnormal   Collection Time: 08/14/14  8:26 PM  Result Value Ref Range Status   MRSA by PCR POSITIVE (A) NEGATIVE Final    Comment:        The GeneXpert MRSA Assay (FDA approved for NASAL specimens only), is one component of a comprehensive MRSA colonization surveillance program. It is not intended to diagnose MRSA infection nor to guide or monitor treatment for  MRSA infections. RESULT CALLED TO, READ BACK BY AND VERIFIED WITH: SHEPARD,B RN [id number] AT 0114 SKEEN,P   Urine culture     Status: None   Collection Time: 08/14/14  8:52 PM  Result Value Ref Range Status   Specimen Description URINE, CLEAN CATCH  Final   Special Requests NONE  Final   Colony Count NO GROWTH Performed at [facility]   Final   Culture NO GROWTH Performed at [facility]   Final   Report Status 08/16/2014 FINAL  Final  Culture, respiratory (tracheal aspirate)     Status: None (Preliminary result)   Collection Time: 08/15/14  4:07 AM  Result Value Ref Range Status   Specimen Description SPUTUM  Final   Special Requests NONE  Final   Gram Stain   Final    ABUNDANT WBC PRESENT,BOTH PMN AND MONONUCLEAR RARE SQUAMOUS EPITHELIAL CELLS PRESENT RARE GRAM POSITIVE COCCI IN PAIRS Performed at [facility]    Culture   Final    Culture reincubated  for better growth Performed at [facility]    Report Status PENDING  Incomplete  Culture, expectorated sputum-assessment     Status: None   Collection Time: 08/15/14  4:07 AM  Result Value Ref Range Status   Specimen Description SPUTUM  Final   Special Requests NONE  Final   Sputum evaluation   Final    MICROSCOPIC FINDINGS SUGGEST THAT THIS SPECIMEN IS NOT REPRESENTATIVE OF LOWER RESPIRATORY SECRETIONS. PLEASE RECOLLECT. Results Called to: Herbert Seta [phone] [facility]    Report Status 08/15/2014 FINAL  Final     Studies:  Recent x-ray studies have been reviewed in detail by the Attending Physician  Scheduled Meds:  Scheduled Meds: . antiseptic oral rinse  7 mL Mouth Rinse q12n4p  . aspirin EC  81 mg Oral Daily  . azithromycin  500 mg Intravenous Q24H  . budesonide (PULMICORT) nebulizer solution  0.5 mg Nebulization BID  . buPROPion  150 mg Oral Daily  . cefTRIAXone (ROCEPHIN)  IV  1 g Intravenous Q24H  . chlorhexidine  15 mL Mouth Rinse BID  . Chlorhexidine Gluconate Cloth  6 each Topical Q0600  . clopidogrel  75 mg Oral Daily  . docusate sodium  100 mg Oral Daily  . enoxaparin (LOVENOX) injection  40 mg Subcutaneous Daily  . feeding supplement (GLUCERNA SHAKE)  237 mL Oral BID BM  . folic acid  1 mg Oral Daily  . insulin aspart  0-15 Units Subcutaneous TID WC  . insulin aspart  0-5 Units Subcutaneous QHS  . insulin glargine  20 Units Subcutaneous QHS  . ipratropium-albuterol  3 mL Nebulization TID  . multivitamin with minerals  1 tablet Oral Daily  . mupirocin ointment  1 application Nasal BID  . omega-3 acid ethyl esters  1 g Oral BID  . pantoprazole  40 mg Oral Daily  . rosuvastatin  20 mg Oral QHS  . sodium chloride  3 mL Intravenous Q12H  . thiamine  100 mg Oral Daily    Time spent on care of this patient: 40 mins   Ethen Bannan, Geraldo Docker , MD  [facility] Office  [phone] Pager - [phone]  On-Call/Text Page:      [email or web address]       password [email or web address]  If 7PM-7AM, please contact night-coverage [email or web address] Password [email or web address] 08/16/2014, 6:08 PM   LOS: 2 days   Care during the described time interval was provided by me .  I have reviewed  this patient's available data, including medical history, events of note, physical examination, radiology studies and test results as part of my evaluation  Dia Crawford, MD [phone] Pager

## 2014-08-17 DIAGNOSIS — R571 Hypovolemic shock: Secondary | ICD-10-CM

## 2014-08-17 LAB — CBC WITH DIFFERENTIAL/PLATELET
Basophils Absolute: 0 10*3/uL (ref 0.0–0.1)
Basophils Relative: 0 % (ref 0–1)
EOS ABS: 0 10*3/uL (ref 0.0–0.7)
EOS PCT: 0 % (ref 0–5)
HCT: 37.8 % (ref 36.0–46.0)
Hemoglobin: 12.2 g/dL (ref 12.0–15.0)
LYMPHS ABS: 0.3 10*3/uL — AB (ref 0.7–4.0)
Lymphocytes Relative: 5 % — ABNORMAL LOW (ref 12–46)
MCH: 30.1 pg (ref 26.0–34.0)
MCHC: 32.3 g/dL (ref 30.0–36.0)
MCV: 93.3 fL (ref 78.0–100.0)
MONO ABS: 0.2 10*3/uL (ref 0.1–1.0)
MONOS PCT: 3 % (ref 3–12)
Neutro Abs: 4.9 10*3/uL (ref 1.7–7.7)
Neutrophils Relative %: 92 % — ABNORMAL HIGH (ref 43–77)
Platelets: 128 10*3/uL — ABNORMAL LOW (ref 150–400)
RBC: 4.05 MIL/uL (ref 3.87–5.11)
RDW: 14.4 % (ref 11.5–15.5)
WBC: 5.4 10*3/uL (ref 4.0–10.5)

## 2014-08-17 LAB — COMPREHENSIVE METABOLIC PANEL
ALK PHOS: 233 U/L — AB (ref 39–117)
ALT: 63 U/L — ABNORMAL HIGH (ref 0–35)
AST: 59 U/L — AB (ref 0–37)
Albumin: 2.5 g/dL — ABNORMAL LOW (ref 3.5–5.2)
Anion gap: 6 (ref 5–15)
BILIRUBIN TOTAL: 1.4 mg/dL — AB (ref 0.3–1.2)
BUN: 7 mg/dL (ref 6–23)
CO2: 30 mmol/L (ref 19–32)
CREATININE: 0.78 mg/dL (ref 0.50–1.10)
Calcium: 8.5 mg/dL (ref 8.4–10.5)
Chloride: 100 mmol/L (ref 96–112)
GFR calc Af Amer: >90 mL/min (ref >90)
GFR calc non Af Amer: 87 mL/min — ABNORMAL LOW (ref >90)
Glucose, Bld: 146 mg/dL — ABNORMAL HIGH (ref 70–99)
Potassium: 4.2 mmol/L (ref 3.5–5.1)
Sodium: 136 mmol/L (ref 135–145)
Total Protein: 5.2 g/dL — ABNORMAL LOW (ref 6.0–8.3)

## 2014-08-17 LAB — GLUCOSE, CAPILLARY
GLUCOSE-CAPILLARY: 118 mg/dL — AB (ref 70–99)
GLUCOSE-CAPILLARY: 222 mg/dL — AB (ref 70–99)
GLUCOSE-CAPILLARY: 282 mg/dL — AB (ref 70–99)
Glucose-Capillary: 147 mg/dL — ABNORMAL HIGH (ref 70–99)
Glucose-Capillary: 163 mg/dL — ABNORMAL HIGH (ref 70–99)

## 2014-08-17 LAB — CULTURE, RESPIRATORY: CULTURE: NORMAL

## 2014-08-17 LAB — LIPID PANEL
CHOL/HDL RATIO: 1.9 ratio
CHOLESTEROL: 108 mg/dL (ref 0–200)
HDL: 58 mg/dL (ref >39)
LDL Cholesterol: 40 mg/dL (ref 0–99)
Triglycerides: 48 mg/dL (ref <150)
VLDL: 10 mg/dL (ref 0–40)

## 2014-08-17 LAB — CULTURE, RESPIRATORY W GRAM STAIN

## 2014-08-17 LAB — MAGNESIUM: MAGNESIUM: 1.8 mg/dL (ref 1.5–2.5)

## 2014-08-17 MED ORDER — PREDNISONE 20 MG PO TABS
40.0000 mg | ORAL_TABLET | Freq: Every day | ORAL | Status: DC
  Administered 2014-08-18: 40 mg via ORAL
  Filled 2014-08-17 (×2): qty 2

## 2014-08-17 MED ORDER — INSULIN ASPART 100 UNIT/ML ~~LOC~~ SOLN
0.0000 [IU] | Freq: Three times a day (TID) | SUBCUTANEOUS | Status: DC
  Administered 2014-08-17: 4 [IU] via SUBCUTANEOUS
  Administered 2014-08-17: 11 [IU] via SUBCUTANEOUS
  Administered 2014-08-18: 7 [IU] via SUBCUTANEOUS
  Administered 2014-08-18: 4 [IU] via SUBCUTANEOUS

## 2014-08-17 MED ORDER — IPRATROPIUM-ALBUTEROL 0.5-2.5 (3) MG/3ML IN SOLN
3.0000 mL | Freq: Four times a day (QID) | RESPIRATORY_TRACT | Status: DC
  Administered 2014-08-17 – 2014-08-18 (×3): 3 mL via RESPIRATORY_TRACT
  Filled 2014-08-17 (×3): qty 3

## 2014-08-17 MED ORDER — ACETAMINOPHEN 500 MG PO TABS: 500.0000 mg | ORAL_TABLET | Freq: Four times a day (QID) | ORAL | Status: DC | PRN

## 2014-08-17 MED ORDER — CEFUROXIME AXETIL 500 MG PO TABS
500.0000 mg | ORAL_TABLET | Freq: Two times a day (BID) | ORAL | Status: DC
  Administered 2014-08-18 (×2): 500 mg via ORAL
  Filled 2014-08-17 (×3): qty 1

## 2014-08-17 MED ORDER — AZITHROMYCIN 500 MG PO TABS
500.0000 mg | ORAL_TABLET | Freq: Every day | ORAL | Status: AC
  Administered 2014-08-17 – 2014-08-18 (×2): 500 mg via ORAL
  Filled 2014-08-17 (×2): qty 1

## 2014-08-17 MED ORDER — SODIUM CHLORIDE 0.9 % IJ SOLN
10.0000 mL | INTRAMUSCULAR | Status: DC | PRN
  Administered 2014-08-18: 30 mL
  Filled 2014-08-17: qty 40

## 2014-08-17 NOTE — Progress Notes (Signed)
Physical Therapy Treatment Patient Details Name: Brenda [name] MRN: [id number] DOB: 1951/03/13 Today's Date: 08/17/2014    History of Present Illness This is a 64 y.o. female, with a past medical history of CAD (with multiple stents), PAD, carotid artery disease, CVA, COPD, diabetes, oral cancer, and DVT. Initially she has been feeling lousy the past few days with some viral respiratory symptoms (her family member was having the same thing about 2 weeks ago). She presents to the emergency department for dyspnea, cough + white sputum and chest pain and is found to have left lower lobe pneumonia, has hypotension    PT Comments    Progressing steadily.  SpO2 maintained at 91-92% on 3L [place] during gait, whereas sats dropped to 78% on RA during gait.  Follow Up Recommendations  Outpatient PT;Other (comment) (pumonary rehab)     Equipment Recommendations  Rolling walker with 5" wheels    Recommendations for Other Services       Precautions / Restrictions Precautions Precautions: Fall    Mobility  Bed Mobility Overal bed mobility: Modified Independent                Transfers Overall transfer level: Needs assistance Equipment used: None;1 person hand held assist Transfers: Sit to/from Stand Sit to Stand: Supervision         General transfer comment: safe transitions  Ambulation/Gait Ambulation/Gait assistance: Supervision Ambulation Distance (Feet): 450 Feet (4 rests to record SpO2 dnd HR) Assistive device: None Gait Pattern/deviations: Step-through pattern Gait velocity: slight variability in cadence, but generally prefers slower   General Gait Details: generally steady, able to scan and turn to look behind while continuing forward.  Generally slow speec, but able to increase appreaciably.   Stairs Stairs: Yes Stairs assistance: Min assist Stair Management: One rail Right;Two rails;Step to pattern;Forwards Number of Stairs: 3 General stair comments: pt unable  to power up to full knee extension with either LE and had to get the 2nd foot quickly to next step before leading knee collapsed.  Wheelchair Mobility    Modified Rankin (Stroke Patients Only)       Balance Overall balance assessment: Needs assistance Sitting-balance support: No upper extremity supported Sitting balance-Leahy Scale: Good     Standing balance support: No upper extremity supported Standing balance-Leahy Scale: Fair                      Cognition Arousal/Alertness: Awake/alert Behavior During Therapy: WFL for tasks assessed/performed Overall Cognitive Status: Within Functional Limits for tasks assessed                      Exercises      General Comments General comments (skin integrity, edema, etc.): SpO2 on RA with ambulation dropped to 78% with EHR 98 bpm; On 3L [place] and efficient breathing, pt could maintain 91-92% and EHR ~85 bpm      Pertinent Vitals/Pain Pain Assessment: No/denies pain    Home Living                      Prior Function            PT Goals (current goals can now be found in the care plan section) Acute Rehab PT Goals Patient Stated Goal: wants to go home and get out of here PT Goal Formulation: With patient Time For Goal Achievement: 08/29/14 Potential to Achieve Goals: Good Progress towards PT goals: Progressing toward goals  Frequency  Min 3X/week    PT Plan Current plan remains appropriate    Co-evaluation             End of Session Equipment Utilized During Treatment: Oxygen Activity Tolerance: Patient tolerated treatment well Patient left: in bed;with call bell/phone within reach;with family/visitor present     Time: 3700-5259 PT Time Calculation (min) (ACUTE ONLY): 28 min  Charges:  $Gait Training: 8-22 mins $Therapeutic Activity: 8-22 mins                    G Codes:      Senya Hinzman, Tessie Fass 08/17/2014, 2:49 PM 08/17/2014  Donnella Sham, PT [phone]   (pager)

## 2014-08-17 NOTE — Progress Notes (Signed)
[place] TEAM 1 - Stepdown/ICU TEAM Progress Note  [name]:[id number] DOB: 03-29-1951 DOA: 08/14/2014 PCP: Dwan Bolt, MD  Admit HPI / Brief Narrative: 64 y.o.F Hx CAD (with multiple stents), PAD, carotid artery disease, CVA, COPD, diabetes, oral cancer, alcoholism, tobacco abuse (continues to smoke) and DVT who presented to the emergency department for chest pain and was found to have sepsis from left lower lobe pneumonia with associated hypotension.  She developed centralized chest pain that radiated bilaterally across her ribs and up to her left shoulder. After a short period of time the pain subsided and became more of a pressure.   In the ER her temperature was 101.6, pulse rate 122, respirations 30, blood pressure 78/58. She received 2 L of normal saline and her blood pressure was 85/49. Chest x-ray showed left lower lobe infiltrate. Her white count was not elevated, and her lactic acid was within normal limits.   HPI/Subjective: Pt is feeling much better, but required 3L [place] O2 when ambulating to keep her sats at 88% or >.  She denies current cp, n/v, or abdom pain.    Assessment/Plan:  Severe sepsis associated with hypotension due to CAP -sepsis physiology resolved w/ volume resuscitation and abx tx of CAP  Left lower lobe CAP -Influenza PCR negative -HIV negative -Legionella urine antigen negative -Strep pneumo urine antigen positive - to complete a 10 day course of abx tx - transition to ceftin   COPD exacerbation -rapidly improved w/ steroids and bronchodilators - saturations <80% when ambulating - required 3LPM [place] O2 to keep sats at 88% or > so have prescribed home O2 - will need f/u assessment as outpt to determine if able to wean off O2 as PNA continues to improve   Chest pain -Likely due to pneumonia and COPD -Troponin 3 negative  -Echocardiogram not yet accomplished - feel it is important to r/o WMA in this pt w/ a known hx of CAD - will also f/u  EKG in AM - if EKG benign and no WMA on TTE, could f/u w/ Cards as an outpt - if WMA noted or if EKG not stable will need inpt eval  -Continue Plavix w/ history of stents  Tobacco abuse -Counseled to stop smoking - unable to tolerate nicotine patch  Alcohol use - elevated transaminases -Continued CIWA protocol with when necessary Ativan -transaminitis likely due to EtOH abuse + possible mild shock liver in setting of sepsis - recheck in AM to assure is improving prior to d/c   Diabetes mellitus uncontrolled -A1c 9.3 - CBG erratic - follow w/o change today, w/ decrease in steroids   Hyperlipidemia -hold crestor until LFTs improved/stable   Code Status: FULL Family Communication: spoke w/ husband at bedside at length Disposition Plan: possible D/C home in AM depending upon LFTs, EKG, and TTE  Consultants: PCCM  Procedures:  3/16 TTE - pending   Antibiotics: Azithromycin 3/13 > Ceftriaxone 3/13 >  DVT prophylaxis: Lovenox  Objective: Blood pressure 103/49, pulse 77, temperature 98.1 F (36.7 C), temperature source Oral, resp. rate 16, height 5\' 6"  (1.676 m), weight 62.9 kg (138 lb 10.7 oz), SpO2 100 %.  Intake/Output Summary (Last 24 hours) at 08/17/14 1414 Last data filed at 08/17/14 1000  Gross per 24 hour  Intake   1137 ml  Output   1400 ml  Net   -263 ml   Exam: General: no acute resp distress at rest on [place] O2 Lungs: poor air movement in all lobes, no  wheezes or focal crackles noted  Cardiovascular: Regular rate and rhythm without murmur gallop or rub normal S1 and S2 Abdomen: Nontender, nondistended, soft, bowel sounds positive, no rebound, no ascites, no appreciable mass Extremities: No significant cyanosis, clubbing, or edema bilateral lower extremities  Data Reviewed: Basic Metabolic Panel:  Recent Labs Lab 08/14/14 1230 08/15/14 0334 08/16/14 2030 08/17/14 0642  NA 133* 136 133* 136  K 4.5 3.9 4.0 4.2  CL 96 106 96 100  CO2 26 23 34* 30    GLUCOSE 277* 174* 237* 146*  BUN 9 9 8 7   CREATININE 0.76 0.55 0.79 0.78  CALCIUM 9.1 7.8* 8.4 8.5  MG  --  1.5 1.8 1.8  PHOS  --  2.4  --   --    Liver Function Tests:  Recent Labs Lab 08/15/14 0334 08/16/14 2030 08/17/14 0642  AST 17 97* 59*  ALT 17 71* 63*  ALKPHOS 78 221* 233*  BILITOT 0.9 0.9 1.4*  PROT 4.6* 5.1* 5.2*  ALBUMIN 2.4* 2.5* 2.5*   CBC:  Recent Labs Lab 08/14/14 1230 08/15/14 0334 08/16/14 2030 08/17/14 0642  WBC 10.0 11.8* 5.4 5.4  NEUTROABS 9.5*  --   --  4.9  HGB 16.7* 12.6 12.4 12.2  HCT 48.7* 38.0 37.6 37.8  MCV 92.4 94.1 93.5 93.3  PLT 150 120* 104* 128*   Cardiac Enzymes:  Recent Labs Lab 08/14/14 2113 08/15/14 0334 08/15/14 0820  TROPONINI <0.03 <0.03 0.03   CBG:  Recent Labs Lab 08/16/14 1653 08/16/14 2159 08/17/14 0024 08/17/14 0814 08/17/14 1219  GLUCAP 224* 174* 118* 147* 282*    Recent Results (from the past 240 hour(s))  Culture, blood (routine x 2)     Status: None (Preliminary result)   Collection Time: 08/14/14 12:30 PM  Result Value Ref Range Status   Specimen Description BLOOD RIGHT ANTECUBITAL  Final   Special Requests BOTTLES DRAWN AEROBIC AND ANAEROBIC 5CC   Final   Culture   Final           BLOOD CULTURE RECEIVED NO GROWTH TO DATE CULTURE WILL BE HELD FOR 5 DAYS BEFORE ISSUING A FINAL NEGATIVE REPORT Performed at [facility]    Report Status PENDING  Incomplete  Culture, blood (routine x 2)     Status: None (Preliminary result)   Collection Time: 08/14/14  2:25 PM  Result Value Ref Range Status   Specimen Description BLOOD BLOOD RIGHT FOREARM  Final   Special Requests BOTTLES DRAWN AEROBIC AND ANAEROBIC 10 CC  Final   Culture   Final           BLOOD CULTURE RECEIVED NO GROWTH TO DATE CULTURE WILL BE HELD FOR 5 DAYS BEFORE ISSUING A FINAL NEGATIVE REPORT Note: Culture results may be compromised due to an excessive volume of blood received in culture bottles. Performed at [facility]    Report Status PENDING  Incomplete  MRSA PCR Screening     Status: Abnormal   Collection Time: 08/14/14  8:26 PM  Result Value Ref Range Status   MRSA by PCR POSITIVE (A) NEGATIVE Final    Comment:        The GeneXpert MRSA Assay (FDA approved for NASAL specimens only), is one component of a comprehensive MRSA colonization surveillance program. It is not intended to diagnose MRSA infection nor to guide or monitor treatment for MRSA infections. RESULT CALLED TO, READ BACK BY AND VERIFIED WITH: [facility] RN [id number] AT 0114 SKEEN,P   Urine  culture     Status: None   Collection Time: 08/14/14  8:52 PM  Result Value Ref Range Status   Specimen Description URINE, CLEAN CATCH  Final   Special Requests NONE  Final   Colony Count NO GROWTH Performed at [facility]   Final   Culture NO GROWTH Performed at [facility]   Final   Report Status 08/16/2014 FINAL  Final  Culture, respiratory (tracheal aspirate)     Status: None   Collection Time: 08/15/14  4:07 AM  Result Value Ref Range Status   Specimen Description SPUTUM  Final   Special Requests NONE  Final   Gram Stain   Final    ABUNDANT WBC PRESENT,BOTH PMN AND MONONUCLEAR RARE SQUAMOUS EPITHELIAL CELLS PRESENT RARE GRAM POSITIVE COCCI IN PAIRS Performed at [facility]    Culture   Final    NORMAL OROPHARYNGEAL FLORA Performed at [facility]    Report Status 08/17/2014 FINAL  Final  Culture, expectorated sputum-assessment     Status: None   Collection Time: 08/15/14  4:07 AM  Result Value Ref Range Status   Specimen Description SPUTUM  Final   Special Requests NONE  Final   Sputum evaluation   Final    MICROSCOPIC FINDINGS SUGGEST THAT THIS SPECIMEN IS NOT REPRESENTATIVE OF LOWER RESPIRATORY SECRETIONS. PLEASE RECOLLECT. Results Called to: Herbert Seta [phone] [facility]    Report Status 08/15/2014 FINAL  Final     Studies:  Recent x-ray studies have been reviewed in  detail by the Attending Physician  Scheduled Meds:  Scheduled Meds: . antiseptic oral rinse  7 mL Mouth Rinse q12n4p  . aspirin EC  81 mg Oral Daily  . azithromycin  500 mg Oral Daily  . buPROPion  150 mg Oral Daily  . cefTRIAXone (ROCEPHIN)  IV  1 g Intravenous Q24H  . chlorhexidine  15 mL Mouth Rinse BID  . Chlorhexidine Gluconate Cloth  6 each Topical Q0600  . clopidogrel  75 mg Oral Daily  . dextromethorphan-guaiFENesin  1 tablet Oral BID  . docusate sodium  100 mg Oral Daily  . enoxaparin (LOVENOX) injection  40 mg Subcutaneous Daily  . feeding supplement (GLUCERNA SHAKE)  237 mL Oral BID BM  . folic acid  1 mg Oral Daily  . insulin aspart  0-20 Units Subcutaneous TID WC  . insulin glargine  20 Units Subcutaneous QHS  . ipratropium-albuterol  3 mL Nebulization Q6H  . methylPREDNISolone (SOLU-MEDROL) injection  60 mg Intravenous Q24H  . multivitamin with minerals  1 tablet Oral Daily  . mupirocin ointment  1 application Nasal BID  . omega-3 acid ethyl esters  1 g Oral BID  . pantoprazole  40 mg Oral Daily  . rosuvastatin  20 mg Oral QHS  . sodium chloride  3 mL Intravenous Q12H  . thiamine  100 mg Oral Daily    Time spent on care of this patient: 35 mins  Cherene Altes, MD [facility] For Consults/Admissions - Flow Manager - [phone] Office  [phone]  Contact MD directly via text page:      [email or web address]      password [facility]  08/17/2014, 2:14 PM   LOS: 3 days

## 2014-08-17 NOTE — Progress Notes (Signed)
Echocardiogram 2D Echocardiogram has been performed.  Brenda [name] 08/17/2014, 3:03 PM

## 2014-08-17 NOTE — Progress Notes (Signed)
SATURATION QUALIFICATIONS: (This note is used to comply with regulatory documentation for home oxygen)  Patient Saturations on Room Air at Rest = N/A%  Patient Saturations on Room Air while Ambulating = 78%  Patient Saturations on 3L Liters of oxygen while Ambulating = 91-92%  Please briefly explain why patient needs home oxygen :Pt's SpO2 was maintained at suitable levels during ambulation with supplemen3/16/2016  Donnella Sham, [place] [phone]  (pager)tal oxygen.

## 2014-08-18 LAB — COMPREHENSIVE METABOLIC PANEL
ALBUMIN: 2.3 g/dL — AB (ref 3.5–5.2)
ALK PHOS: 229 U/L — AB (ref 39–117)
ALT: 61 U/L — ABNORMAL HIGH (ref 0–35)
ANION GAP: 8 (ref 5–15)
AST: 50 U/L — ABNORMAL HIGH (ref 0–37)
BUN: 9 mg/dL (ref 6–23)
CO2: 28 mmol/L (ref 19–32)
CREATININE: 0.62 mg/dL (ref 0.50–1.10)
Calcium: 8.5 mg/dL (ref 8.4–10.5)
Chloride: 99 mmol/L (ref 96–112)
GFR calc Af Amer: >90 mL/min (ref >90)
GFR calc non Af Amer: >90 mL/min (ref >90)
Glucose, Bld: 91 mg/dL (ref 70–99)
POTASSIUM: 4 mmol/L (ref 3.5–5.1)
SODIUM: 135 mmol/L (ref 135–145)
Total Bilirubin: 0.6 mg/dL (ref 0.3–1.2)
Total Protein: 5.1 g/dL — ABNORMAL LOW (ref 6.0–8.3)

## 2014-08-18 LAB — CBC
HEMATOCRIT: 33 % — AB (ref 36.0–46.0)
Hemoglobin: 11 g/dL — ABNORMAL LOW (ref 12.0–15.0)
MCH: 31.3 pg (ref 26.0–34.0)
MCHC: 33.3 g/dL (ref 30.0–36.0)
MCV: 93.8 fL (ref 78.0–100.0)
Platelets: 129 10*3/uL — ABNORMAL LOW (ref 150–400)
RBC: 3.52 MIL/uL — ABNORMAL LOW (ref 3.87–5.11)
RDW: 14.4 % (ref 11.5–15.5)
WBC: 4.4 10*3/uL (ref 4.0–10.5)

## 2014-08-18 LAB — GLUCOSE, CAPILLARY
GLUCOSE-CAPILLARY: 250 mg/dL — AB (ref 70–99)
Glucose-Capillary: 101 mg/dL — ABNORMAL HIGH (ref 70–99)
Glucose-Capillary: 181 mg/dL — ABNORMAL HIGH (ref 70–99)

## 2014-08-18 MED ORDER — INSULIN ASPART 100 UNIT/ML ~~LOC~~ SOLN: 10.0000 [IU] | Freq: Three times a day (TID) | SUBCUTANEOUS | Status: DC

## 2014-08-18 MED ORDER — BUPROPION HCL ER (XL) 150 MG PO TB24
150.0000 mg | ORAL_TABLET | Freq: Every day | ORAL | Status: DC
Start: 2014-08-18 — End: 2015-09-26

## 2014-08-18 MED ORDER — INSULIN GLARGINE 100 UNIT/ML ~~LOC~~ SOLN
30.0000 [IU] | Freq: Every day | SUBCUTANEOUS | Status: DC
  Filled 2014-08-18: qty 0.3

## 2014-08-18 MED ORDER — CEFUROXIME AXETIL 500 MG PO TABS: 500.0000 mg | ORAL_TABLET | Freq: Two times a day (BID) | ORAL | Status: DC

## 2014-08-18 MED ORDER — DM-GUAIFENESIN ER 30-600 MG PO TB12: 1.0000 | ORAL_TABLET | Freq: Two times a day (BID) | ORAL | Status: DC

## 2014-08-18 MED ORDER — ALBUTEROL SULFATE HFA 108 (90 BASE) MCG/ACT IN AERS: 2.0000 | INHALATION_SPRAY | Freq: Four times a day (QID) | RESPIRATORY_TRACT | Status: DC | PRN

## 2014-08-18 MED ORDER — INSULIN GLARGINE 100 UNIT/ML ~~LOC~~ SOLN: 30.0000 [IU] | Freq: Every day | SUBCUTANEOUS | Status: DC

## 2014-08-18 MED ORDER — INSULIN ASPART 100 UNIT/ML ~~LOC~~ SOLN
10.0000 [IU] | Freq: Three times a day (TID) | SUBCUTANEOUS | Status: DC
  Administered 2014-08-18: 10 [IU] via SUBCUTANEOUS

## 2014-08-18 NOTE — Discharge Summary (Signed)
Physician Discharge Summary  Brenda [name]:[id number] DOB: [birth year] DOA: 08/14/2014  PCP: Dwan Bolt, MD  Admit date: 08/14/2014 Discharge date: 08/18/2014  Time spent: 40 minutes  Recommendations for Outpatient Follow-up:   Severe sepsis associated with hypotension. -Resolved; secondary to COPD exacerbation/CAP  Left lower lobe pneumonia -Influenza PCR negative.  -HIV negative -Legionella urine antigen negative -Strep pneumo urine antigen positive -Continue azithromycin + Ceftin treatment for full 10 days of antibiotic  COPD exacerbation/CAP -Continue antibiotics for full ten-day course ,  -Flutter valve -Mucinex DM BID -SATURATION QUALIFICATIONS: (This note is used to comply with regulatory documentation for home oxygen) Patient Saturations on Room Air at Rest = N/A% Patient Saturations on Room Air while Ambulating = 78% Patient Saturations on 3L Liters of oxygen while Ambulating = 91-92% Please briefly explain why patient needs home oxygen :Pt's SpO2 was maintained at suitable levels during ambulation with supplemen3/16/2016 -O2 3 L via [place] 24 hours a day. -Follow-up with PCP  Chest pain -Likely due to pneumonia and COPD. -Troponin 3 negative . -Echocardiogram; normal findings see results below  -Continue Plavix for history of stents. -Follow-up with PCP  Tobacco abuse -Counseled extensively on sequela of continuing to smoke and the danger of smoking while using 02 to include death.  -Follow-up with PCP  Alcohol use -Counseled on abstaining from alcohol use.   Diabetes mellitus type II uncontrolled -3/13 hemoglobin A1c= 9.3 -Continue Lantus 30 units daily at bedtime - NovoLog 10 units before meals -Check FSBS before each meal and before bedtime and record in logbook -Record all meals and snacks in logbook -Follow-up with PCP in sure you take log book to follow-up  Hyperlipidemia -Continue Crestor. 20mg  daily -Lipid panel; within ADA  guideline      Discharge Diagnoses:  Principal Problem:   Sepsis associated hypotension Active Problems:   DM (diabetes mellitus), type 2, uncontrolled   History of CVA (cerebrovascular accident)   Tobacco abuse   Hyperlipemia   Claudication in peripheral vascular disease   Coronary artery disease-multiple stents- last cath 2007- Myoview low risk 4/13   COPD (chronic obstructive pulmonary disease)   CAP (community acquired pneumonia)   Chest pain at rest   Multiple open wounds of lower extremity   Septic shock   Community acquired pneumonia   COPD exacerbation   Alcohol abuse   Discharge Condition: Stable  Diet recommendation: American diabetic Association   Filed Weights   08/16/14 0517 08/17/14 0521 08/18/14 0615  Weight: 65 kg (143 lb 4.8 oz) 62.9 kg (138 lb 10.7 oz) 63.1 kg (139 lb 1.8 oz)    History of present illness:  Brenda [name] 64 y.o.WF PMHx CAD (with multiple stents), PAD, carotid artery disease, CVA, COPD, diabetes, oral cancer, alcoholism, tobacco abuse (continues to smoke) and DVT.  Presents to the emergency department for chest pain and is found to have sepsis from left lower lobe pneumonia with associated hypotension. Mr. Gueye reports she has been feeling poorly for several days and has had a cough. She is bringing up white sputum. This morning she felt particularly bad and developed centralized chest pain that radiated bilaterally across her ribs and up to her left shoulder. After a short period of time the pain subsided and became more of a pressure. She continues to have the pressure currently (it's been 6 hours). She has also had some posttussive vomiting today. She is having difficulty with acid reflux and takes omeprazole at home.  In the ER her temperature is 101.6, pulse  rate 122, respirations 30, blood pressure 78/58. She is on 2 L of oxygen. She has received 2 L of normal saline and her blood pressure is currently 85/49. Chest x-ray shows left  lower lobe infiltrate. Her white count is not elevated, and her lactic acid is within normal limits. We're admitting her to stepdown for sepsis associated with hypotension secondary to left lower lobe pneumonia and COPD exacerbation. We have asked pulmonary critical care to consult as she will likely need pressors. During this hospitalization stay patient was treated for severe sepsis associated with hypotension secondary to COPD exacerbation/CAP. In addition patient was treated for diabetes type 2 uncontrolled. Currently patient is stable, however now requires continuous O2.   Procedure/Significant Events:  3/15 CXR;Questionable improvement in left basilar consolidation.- Small bilateral pleural effusions 3/16 echocardiogram;- LVEF= 55%- 60%.    Culture 3/13 blood right antecubital/forearm NGTD 3/13 MRSA positive by PCR 3/13 urine negative 3/13 strep pneumo urine antigen positive 3/13 Legionella urinary antigen negative 3/14 invalid sputum 3/13 influenza A/B/H1N1 negative  Antibiotics: Azithromycin 3/15>> Ceftriaxone 3/15>> stopped 3/16 Ceftin 3/17>>    Discharge Exam: Filed Vitals:   08/17/14 2137 08/18/14 0614 08/18/14 0615 08/18/14 1532  BP: 112/87 117/61  121/63  Pulse: 74 66  64  Temp: 97.8 F (36.6 C) 97.9 F (36.6 C)  98.9 F (37.2 C)  TempSrc: Oral   Oral  Resp: 16 16  20   Height:      Weight:   63.1 kg (139 lb 1.8 oz)   SpO2: 99% 97%  98%    General: A/O 4, acute on chronic respiratory distress, Lungs:  poor air movement in all lobes, negative expiratory wheezing, or crackles Cardiovascular: Regular rate and rhythm without murmur gallop or rub normal S1 and S2 Abdomen: Nontender, nondistended, soft, bowel sounds positive, no rebound, no ascites, no appreciable mass Extremities: No significant cyanosis, clubbing, or edema bilateral lower extremities   Discharge Instructions     Medication List    ASK your doctor about these medications         acetaminophen 500 MG tablet  Commonly known as:  TYLENOL  Take 1,000 mg by mouth every 6 (six) hours as needed for pain.     aspirin EC 81 MG tablet  Take 81 mg by mouth daily.     BREO ELLIPTA IN  Inhale 1 puff into the lungs daily.     buPROPion 150 MG 24 hr tablet  Commonly known as:  WELLBUTRIN XL  Take 150 mg by mouth daily.     calcium carbonate (dosed in mg elemental calcium) 1250 (500 CA) MG/5ML  Take 500 mg of elemental calcium by mouth daily with breakfast.     clopidogrel 75 MG tablet  Commonly known as:  PLAVIX  Take 75 mg by mouth daily.     docusate sodium 100 MG capsule  Commonly known as:  COLACE  Take 100 mg by mouth daily.     Fiber Caps  Take 2 capsules by mouth daily.     fish oil-omega-3 fatty acids 1000 MG capsule  Take 1 g by mouth 2 (two) times daily.     insulin glargine 100 UNIT/ML injection  Commonly known as:  LANTUS  Inject 10-20 Units into the skin 2 (two) times daily. Inject 10 units each morning. Inject evening dose as follows: 16 units up to cbg of 250.  20 units if cbg is higher than 250.     insulin glulisine 100 UNIT/ML injection  Commonly  known as:  APIDRA  Inject 4-8 Units into the skin 4 (four) times daily as needed for high blood sugar. For blood sugar levels     nitroGLYCERIN 0.4 MG/SPRAY spray  Commonly known as:  NITROLINGUAL  Place 1 spray under the tongue every 5 (five) minutes as needed for chest pain.     omeprazole 20 MG capsule  Commonly known as:  PRILOSEC  Take 20 mg by mouth daily.     ondansetron 4 MG disintegrating tablet  Commonly known as:  ZOFRAN-ODT  Take 4 mg by mouth every 8 (eight) hours as needed for nausea.     rosuvastatin 20 MG tablet  Commonly known as:  CRESTOR  Take 20 mg by mouth daily.       Allergies  Allergen Reactions  . Tape Hives and Other (See Comments)    USE PAPER TAPE ONLY- Adhesive peels off skin-makes pt. Raw.  Maryjo Rochester [Nicotine] Rash    To the PATCH only, breakouts on  skin      The results of significant diagnostics from this hospitalization (including imaging, microbiology, ancillary and laboratory) are listed below for reference.    Significant Diagnostic Studies: Dg Chest 2 View  08/16/2014   CLINICAL DATA:  Pneumonia and chest pain  EXAM: CHEST  2 VIEW  COMPARISON:  08/14/2014  FINDINGS: Right IJ central line, tip still at the SVC level.  Questionable improvement in dense consolidation in the peripheral left lower lobe. There is diffuse interstitial coarsening which is unchanged. Small bilateral pleural effusions. Normal heart size and aortic contours.  IMPRESSION: 1. Questionable improvement in left basilar consolidation. 2. Small bilateral pleural effusions.   Electronically Signed   By: Monte Fantasia M.D.   On: 08/16/2014 12:58   Dg Chest 2 View  08/14/2014   CLINICAL DATA:  Left-sided chest pain and epigastric pain which began acutely earlier today, associated with acute worsening of chronic shortness of breath. Current history of diabetes, COPD/chronic bronchitis, coronary artery disease. 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  EXAM: CHEST  2 VIEW  COMPARISON:  05/18/2014 and earlier.  FINDINGS: Cardiac silhouette normal in size, unchanged. Thoracic aorta atherosclerotic, unchanged. Hilar and mediastinal contours otherwise unremarkable. Airspace consolidation involving the left lower lobe associated with a small to moderate-sized left pleural effusion. No focal airspace consolidation elsewhere. Prominent bronchovascular markings diffusely and moderate central peribronchial thickening, unchanged. Degenerative changes involving the thoracic spine with exaggeration of the usual thoracic kyphosis, unchanged.  IMPRESSION: Acute left lower lobe pneumonia superimposed upon COPD. Associated small to moderate-sized left parapneumonic effusion.   Electronically Signed   By: Evangeline Dakin M.D.   On: 08/14/2014  13:59   Dg Chest Port 1 View  08/14/2014   CLINICAL DATA:  Central line placement.  Initial encounter.  EXAM: PORTABLE CHEST - 1 VIEW  COMPARISON:  Chest radiograph performed earlier today at 1:50 p.m.  FINDINGS: The patient's right IJ line is noted ending about the mid to distal SVC.  Persistent left basilar airspace opacification is compatible with pneumonia, perhaps slightly worsened from the prior study. Mild right basilar airspace opacity is now also seen. No definite pleural effusion or pneumothorax is identified. The left costophrenic angle is incompletely imaged on this study.  The cardiomediastinal silhouette is normal in size. No acute osseous abnormalities are identified.  IMPRESSION: 1. Right IJ line noted ending about the mid to distal SVC. 2. Mildly worsened bibasilar pneumonia, predominantly on the  left.   Electronically Signed   By: Garald Balding M.D.   On: 08/14/2014 22:25    Microbiology: Recent Results (from the past 240 hour(s))  Culture, blood (routine x 2)     Status: None (Preliminary result)   Collection Time: 08/14/14 12:30 PM  Result Value Ref Range Status   Specimen Description BLOOD RIGHT ANTECUBITAL  Final   Special Requests BOTTLES DRAWN AEROBIC AND ANAEROBIC 5CC   Final   Culture   Final           BLOOD CULTURE RECEIVED NO GROWTH TO DATE CULTURE WILL BE HELD FOR 5 DAYS BEFORE ISSUING A FINAL NEGATIVE REPORT Performed at [facility]    Report Status PENDING  Incomplete  Culture, blood (routine x 2)     Status: None (Preliminary result)   Collection Time: 08/14/14  2:25 PM  Result Value Ref Range Status   Specimen Description BLOOD BLOOD RIGHT FOREARM  Final   Special Requests BOTTLES DRAWN AEROBIC AND ANAEROBIC 10 CC  Final   Culture   Final           BLOOD CULTURE RECEIVED NO GROWTH TO DATE CULTURE WILL BE HELD FOR 5 DAYS BEFORE ISSUING A FINAL NEGATIVE REPORT Note: Culture results may be compromised due to an excessive volume of blood received in  culture bottles. Performed at [facility]    Report Status PENDING  Incomplete  MRSA PCR Screening     Status: Abnormal   Collection Time: 08/14/14  8:26 PM  Result Value Ref Range Status   MRSA by PCR POSITIVE (A) NEGATIVE Final    Comment:        The GeneXpert MRSA Assay (FDA approved for NASAL specimens only), is one component of a comprehensive MRSA colonization surveillance program. It is not intended to diagnose MRSA infection nor to guide or monitor treatment for MRSA infections. RESULT CALLED TO, READ BACK BY AND VERIFIED WITH: SHEPARD,B RN [id number] AT 0114 SKEEN,P   Urine culture     Status: None   Collection Time: 08/14/14  8:52 PM  Result Value Ref Range Status   Specimen Description URINE, CLEAN CATCH  Final   Special Requests NONE  Final   Colony Count NO GROWTH Performed at [facility]   Final   Culture NO GROWTH Performed at [facility]   Final   Report Status 08/16/2014 FINAL  Final  Culture, respiratory (tracheal aspirate)     Status: None   Collection Time: 08/15/14  4:07 AM  Result Value Ref Range Status   Specimen Description SPUTUM  Final   Special Requests NONE  Final   Gram Stain   Final    ABUNDANT WBC PRESENT,BOTH PMN AND MONONUCLEAR RARE SQUAMOUS EPITHELIAL CELLS PRESENT RARE GRAM POSITIVE COCCI IN PAIRS Performed at [facility]    Culture   Final    NORMAL OROPHARYNGEAL FLORA Performed at [facility]    Report Status 08/17/2014 FINAL  Final  Culture, expectorated sputum-assessment     Status: None   Collection Time: 08/15/14  4:07 AM  Result Value Ref Range Status   Specimen Description SPUTUM  Final   Special Requests NONE  Final   Sputum evaluation   Final    MICROSCOPIC FINDINGS SUGGEST THAT THIS SPECIMEN IS NOT REPRESENTATIVE OF LOWER RESPIRATORY SECRETIONS. PLEASE RECOLLECT. Results Called to: Herbert Seta [id number] [place]    Report Status 08/15/2014 FINAL  Final     Labs: Basic  Metabolic Panel:  Recent Labs Lab 08/14/14 1230 08/15/14 0334 08/16/14 2030 08/17/14 0642 08/18/14 0525  NA 133* 136 133* 136 135  K 4.5 3.9 4.0 4.2 4.0  CL 96 106 96 100 99  CO2 26 23 34* 30 28  GLUCOSE 277* 174* 237* 146* 91  BUN 9 9 8 7 9   CREATININE 0.76 0.55 0.79 0.78 0.62  CALCIUM 9.1 7.8* 8.4 8.5 8.5  MG  --  1.5 1.8 1.8  --   PHOS  --  2.4  --   --   --    Liver Function Tests:  Recent Labs Lab 08/15/14 0334 08/16/14 2030 08/17/14 0642 08/18/14 0525  AST 17 97* 59* 50*  ALT 17 71* 63* 61*  ALKPHOS 78 221* 233* 229*  BILITOT 0.9 0.9 1.4* 0.6  PROT 4.6* 5.1* 5.2* 5.1*  ALBUMIN 2.4* 2.5* 2.5* 2.3*   No results for input(s): LIPASE, AMYLASE in the last 168 hours. No results for input(s): AMMONIA in the last 168 hours. CBC:  Recent Labs Lab 08/14/14 1230 08/15/14 0334 08/16/14 2030 08/17/14 0642 08/18/14 0525  WBC 10.0 11.8* 5.4 5.4 4.4  NEUTROABS 9.5*  --   --  4.9  --   HGB 16.7* 12.6 12.4 12.2 11.0*  HCT 48.7* 38.0 37.6 37.8 33.0*  MCV 92.4 94.1 93.5 93.3 93.8  PLT 150 120* 104* 128* 129*   Cardiac Enzymes:  Recent Labs Lab 08/14/14 2113 08/15/14 0334 08/15/14 0820  TROPONINI <0.03 <0.03 0.03   BNP: BNP (last 3 results) No results for input(s): BNP in the last 8760 hours.  ProBNP (last 3 results) No results for input(s): PROBNP in the last 8760 hours.  CBG:  Recent Labs Lab 08/17/14 1219 08/17/14 1759 08/17/14 2134 08/18/14 0754 08/18/14 1244  GLUCAP 282* 163* 222* 101* 181*       Signed:  Dia Crawford, MD [facility] [phone] pager

## 2014-08-18 NOTE — Progress Notes (Signed)
Resting o2 sat on Ra ~ 88% sitting, on 2l o2 sat up to 96%. On walking ~59feet on ra, o2 sat down to 84%.  Applied 2L [place] with o2 sat up to 92%. Sitting up in chair at this time, sat=95% on 2l/min.

## 2014-08-19 MED FILL — Lactated Ringer's Solution: INTRAVENOUS | Qty: 1000 | Status: AC

## 2014-08-20 LAB — CULTURE, BLOOD (ROUTINE X 2)
CULTURE: NO GROWTH
CULTURE: NO GROWTH

## 2014-08-23 DIAGNOSIS — J449 Chronic obstructive pulmonary disease, unspecified: Secondary | ICD-10-CM | POA: Diagnosis not present

## 2014-08-23 DIAGNOSIS — J189 Pneumonia, unspecified organism: Secondary | ICD-10-CM | POA: Diagnosis not present

## 2014-08-23 DIAGNOSIS — E118 Type 2 diabetes mellitus with unspecified complications: Secondary | ICD-10-CM | POA: Diagnosis not present

## 2014-08-31 DIAGNOSIS — R05 Cough: Secondary | ICD-10-CM | POA: Diagnosis not present

## 2014-08-31 DIAGNOSIS — J40 Bronchitis, not specified as acute or chronic: Secondary | ICD-10-CM | POA: Diagnosis not present

## 2014-09-18 DIAGNOSIS — J440 Chronic obstructive pulmonary disease with acute lower respiratory infection: Secondary | ICD-10-CM | POA: Diagnosis not present

## 2014-09-26 ENCOUNTER — Encounter: Payer: Self-pay | Admitting: Vascular Surgery

## 2014-09-27 ENCOUNTER — Other Ambulatory Visit (HOSPITAL_COMMUNITY): Payer: Medicare Other

## 2014-09-27 ENCOUNTER — Ambulatory Visit: Payer: Medicare Other | Admitting: Vascular Surgery

## 2014-09-27 ENCOUNTER — Encounter (HOSPITAL_COMMUNITY): Payer: Medicare Other

## 2014-10-07 DIAGNOSIS — J209 Acute bronchitis, unspecified: Secondary | ICD-10-CM | POA: Diagnosis not present

## 2014-10-18 DIAGNOSIS — J440 Chronic obstructive pulmonary disease with acute lower respiratory infection: Secondary | ICD-10-CM | POA: Diagnosis not present

## 2014-10-25 DIAGNOSIS — Z0000 Encounter for general adult medical examination without abnormal findings: Secondary | ICD-10-CM | POA: Diagnosis not present

## 2014-10-25 DIAGNOSIS — E789 Disorder of lipoprotein metabolism, unspecified: Secondary | ICD-10-CM | POA: Diagnosis not present

## 2014-10-25 DIAGNOSIS — E118 Type 2 diabetes mellitus with unspecified complications: Secondary | ICD-10-CM | POA: Diagnosis not present

## 2014-11-01 DIAGNOSIS — R0689 Other abnormalities of breathing: Secondary | ICD-10-CM | POA: Diagnosis not present

## 2014-11-01 DIAGNOSIS — J189 Pneumonia, unspecified organism: Secondary | ICD-10-CM | POA: Diagnosis not present

## 2014-11-01 DIAGNOSIS — R05 Cough: Secondary | ICD-10-CM | POA: Diagnosis not present

## 2014-11-04 ENCOUNTER — Ambulatory Visit (INDEPENDENT_AMBULATORY_CARE_PROVIDER_SITE_OTHER): Payer: Medicare Other | Admitting: Pulmonary Disease

## 2014-11-04 ENCOUNTER — Encounter: Payer: Self-pay | Admitting: Pulmonary Disease

## 2014-11-04 VITALS — BP 128/62 | HR 83 | Temp 98.3°F | Ht 66.0 in | Wt 127.0 lb

## 2014-11-04 DIAGNOSIS — Z720 Tobacco use: Secondary | ICD-10-CM

## 2014-11-04 DIAGNOSIS — J9611 Chronic respiratory failure with hypoxia: Secondary | ICD-10-CM

## 2014-11-04 DIAGNOSIS — J441 Chronic obstructive pulmonary disease with (acute) exacerbation: Secondary | ICD-10-CM

## 2014-11-04 DIAGNOSIS — J432 Centrilobular emphysema: Secondary | ICD-10-CM | POA: Diagnosis not present

## 2014-11-04 DIAGNOSIS — J449 Chronic obstructive pulmonary disease, unspecified: Secondary | ICD-10-CM | POA: Diagnosis not present

## 2014-11-04 NOTE — Progress Notes (Signed)
Subjective:    Patient ID: Brenda [name], female    DOB: 1951/02/15, 64 y.o.   MRN: [id number]  HPI Chief Complaint  Patient presents with  . Pulomary Consult    Referred by Dr. Wilson Singer for SOB and hx of pneumonia. Pt c/o Sob, chest tightness and fatigue.    This is a pleasant 64 year old female who smoked one pack of cigarettes daily since her teenage years who comes to my clinic today for evaluation of worsening shortness of breath with a presumed diagnosis of COPD. She was hospitalized in March 2016 for community-acquired pneumonia my partners cared for her briefly. She had strep antigen positive in her urine and was treated with appropriate anabiotic. She was discharged home on 3 L nasal cannula. She said initially her breathing did improve but since leaving the hospital her dyspnea has worsened slightly. She does note that she has been using her oxygen less at home. She says that she only has a concentrator in her house and she does not have any portable devices. She says that she gets short of breath when she does minimal activity such as trying to clean the house, Lacinda Axon, or go out to the grocery store. She has no weighted take oxygen outside the house. She coughs occasionally but she does not produce significant mucus. She was recently started on Breo had a which she says has helped her shortness of breath some but her dyspnea has persisted. She was given albuterol which she said did help some after her hospital visit but she has not been able to afford it since then.  She still smokes one pack of cigarettes daily.    Past Medical History  Diagnosis Date  . History of DVT (deep vein thrombosis)   . Coronary artery disease     multiple stens  . COPD (chronic obstructive pulmonary disease)   . Transient ischemic attack   . Depression   . Hx of radiation therapy 01/16/09 - 03/06/09    base of tongue, right neck node  . Peripheral arterial disease     status post bilateral  femoropopliteal bypass graft performed by Dr. Sherren Mocha early in the past with significant right lower extremity lifestyle limiting claudication  . Pneumonia   . Hypercholesteremia   . Chronic bronchitis   . Shortness of breath     "w/exertion and sometimes when laying down" (11/26/2012)  . Type II diabetes mellitus   . History of blood transfusion 1955  . GERD (gastroesophageal reflux disease)   . H/O hiatal hernia   . Stroke 2000    left side weakness remains (11/26/2012)  . Arthritis     "hands" (11/26/2012)  . 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 Myocardial infarction 2005    dr Ellyn Hack  with [facility]  . Fatigue 08/23/2013  . Carotid artery occlusion      Family History  Problem Relation Age of Onset  . Cancer Mother     pancreatic  . Diabetes Mother   . Hyperlipidemia Mother   . Hypertension Mother   . Other Mother     varicose vein  . Cancer Father 72    throat  . Heart disease Father   . Heart attack Father   . Cancer Sister     breast  . Diabetes Sister   . Deep vein thrombosis Brother   . Diabetes Brother   . Hearing loss Brother   .  Hyperlipidemia Brother   . Hypertension Brother   . Heart attack Brother   . Clotting disorder Brother   . Diabetes Son   . Hyperlipidemia Son      History   Social History  . Marital Status: Married    Spouse Name: N/A  . Number of Children: N/A  . Years of Education: N/A   Occupational History  . house wife    Social History Main Topics  . Smoking status: Current Every Day Smoker -- 1.00 packs/day for 50 years    Types: Cigarettes  . Smokeless tobacco: Never Used  . Alcohol Use: 6.0 oz/week    10 Glasses of wine per week  . Drug Use: No  . Sexual Activity: Yes   Other Topics Concern  . Not on file   Social History Narrative     Allergies  Allergen Reactions  . Tape Hives and Other (See Comments)    USE PAPER TAPE ONLY- Adhesive peels off skin-makes pt. Raw.  Maryjo Rochester  [Nicotine] Rash    To the PATCH only, breakouts on skin     Outpatient Prescriptions Prior to Visit  Medication Sig Dispense Refill  . acetaminophen (TYLENOL) 500 MG tablet Take 1,000 mg by mouth every 6 (six) hours as needed for pain.    Marland Kitchen aspirin EC 81 MG tablet Take 81 mg by mouth daily.    Marland Kitchen buPROPion (WELLBUTRIN XL) 150 MG 24 hr tablet Take 1 tablet (150 mg total) by mouth daily. 30 tablet 0  . clopidogrel (PLAVIX) 75 MG tablet Take 75 mg by mouth daily.     Marland Kitchen dextromethorphan-guaiFENesin (MUCINEX DM) 30-600 MG per 12 hr tablet Take 1 tablet by mouth 2 (two) times daily. 14 tablet 0  . docusate sodium (COLACE) 100 MG capsule Take 100 mg by mouth daily.    . Fiber CAPS Take 2 capsules by mouth daily.     . fish oil-omega-3 fatty acids 1000 MG capsule Take 1 g by mouth 2 (two) times daily.     . Fluticasone Furoate-Vilanterol (BREO ELLIPTA IN) Inhale 1 puff into the lungs daily.    . insulin glargine (LANTUS) 100 UNIT/ML injection Inject 0.3 mLs (30 Units total) into the skin daily. Inject 10 units each morning. Inject evening dose as follows: 16 units up to cbg of 250.  20 units if cbg is higher than 250. 10 mL 11  . nitroGLYCERIN (NITROLINGUAL) 0.4 MG/SPRAY spray Place 1 spray under the tongue every 5 (five) minutes as needed for chest pain.    Marland Kitchen omeprazole (PRILOSEC) 20 MG capsule Take 20 mg by mouth daily.     . ondansetron (ZOFRAN-ODT) 4 MG disintegrating tablet Take 4 mg by mouth every 8 (eight) hours as needed for nausea.    . rosuvastatin (CRESTOR) 20 MG tablet Take 20 mg by mouth daily.    Marland Kitchen albuterol (PROVENTIL HFA;VENTOLIN HFA) 108 (90 BASE) MCG/ACT inhaler Inhale 2 puffs into the lungs every 6 (six) hours as needed for wheezing or shortness of breath. (Patient not taking: Reported on 11/04/2014) 1 Inhaler 0  . calcium carbonate, dosed in mg elemental calcium, 1250 MG/5ML Take 500 mg of elemental calcium by mouth daily with breakfast.    . cefUROXime (CEFTIN) 500 MG tablet Take 1  tablet (500 mg total) by mouth 2 (two) times daily with a meal. (Patient not taking: Reported on 11/04/2014) 18 tablet 0  . insulin aspart (NOVOLOG) 100 UNIT/ML injection Inject 10 Units into the skin 3 (three)  times daily before meals. (Patient not taking: Reported on 11/04/2014) 10 mL 11   No facility-administered medications prior to visit.       Review of Systems  Constitutional: Negative for fever and unexpected weight change.  HENT: Positive for sneezing. Negative for congestion, dental problem, ear pain, nosebleeds, postnasal drip, rhinorrhea, sinus pressure and trouble swallowing.   Eyes: Negative for redness and itching.  Respiratory: Positive for chest tightness, shortness of breath and wheezing. Negative for cough.   Cardiovascular: Positive for palpitations. Negative for leg swelling.  Gastrointestinal: Positive for nausea. Negative for vomiting.  Genitourinary: Negative for dysuria.  Musculoskeletal: Positive for joint swelling.  Skin: Negative for rash.  Neurological: Negative for headaches.  Hematological: Bruises/bleeds easily.  Psychiatric/Behavioral: Negative for dysphoric mood. The patient is not nervous/anxious.        Objective:   Physical Exam  Filed Vitals:   11/04/14 1053  BP: 128/62  Pulse: 83  Temp: 98.3 F (36.8 C)  TempSrc: Oral  Height: '5\' 6"'$  (1.676 m)  Weight: 127 lb (57.607 kg)  SpO2: 96%   room air  Gen: chronically ill appearing, no acute distress HENT: NCAT, OP clear, neck supple without masses Eyes: PERRL, EOMi Lymph: no cervical lymphadenopathy PULM: Few wheezes, normal effort but poor air movement CV: RRR, no mgr, no JVD GI: BS+, soft, nontender, no hsm Derm: no rash or skin breakdown MSK: normal bulk and tone Neuro: A&Ox4, CN II-XII intact, strength 5/5 in all 4 extremities Psyche: normal mood and affect   2013 CT chest images reviewed there is a stable right upper lobe nodule which is likely scar, there is moderate to severe  centrilobular emphysema and upper lobe distribution bilaterally March 2016 echocardiogram LVEF 55-60%, RV normal Hospitalization records from March 2016 reviewed where she had left lower lobe community-acquired pneumonia and treated with anabiotic's, urine strep antigen was positive June 2016 simple spirometry ratio 46%, FEV1 0.67 L (27% predicted)     Assessment & Plan:  Tobacco abuse She was advised at length to quit smoking today. Specifically, I recommended that she join one of our smoking cessation classes.  As she is still an active smoker and greater than age 78 and she has smoked more than 30 pack years she qualifies for lung cancer screening. We discussed the risks and benefits including the hypothenar positive rate. However, she is in the highest risk category as far as I'm concerned regarding lung cancer screening and so I feel that it's wise for her to proceed. We will order a low-dose CT scan of the lungs.   COPD (chronic obstructive pulmonary disease) Her shortness of breath is clearly due to her severe COPD. I reviewed the images from her 2013 CT chest which shows moderate to severe centrilobular emphysema primarily in an upper lobe distribution.  She continues to smoke cigarettes which is contributed to her ongoing dyspnea, not to mention the fact that it's making her peripheral vascular disease and cerebrovascular disease worse. She was educated at length to quit, see discussion above.   One should also provide patients with annual immunizations and consider therapy for prevention of COPD exacerbations (ie. roflumilast or azithromycin) when appopriate.  -Spirometry today -O2 therapy: I think it would help if she use this continuously, we will prescribe a portable oxygen concentrator -Immunizations: I have asked her to clarify whether or not she's had the pneumonia vaccine with her primary care physician -Tobacco use: See above -Exercise: Referral to pulmonary  rehabilitation -Bronchodilator therapy: Continue Breo,  add Spiriva, I have asked her to help [place] clarify what medications are covered by her insurance formulary -Exacerbation prevention: Quit smoking, add Spiriva    Chronic hypoxemic respiratory failure She has chronic hypoxemic respiratory failure secondary to her COPD. Today at rest her oxygenation was normal but she can only walk about 100 feet due to pain in her legs. I feel that she does need oxygen when she exerts herself.  Plan: Keep oxygen at home to use it 3 L with exertion Home ambulatory oximetry testing again, if positive then we'll prescribe a portable oxygen concentrator Continue oxygen at night, 3 L continuously      Current outpatient prescriptions:  .  acetaminophen (TYLENOL) 500 MG tablet, Take 1,000 mg by mouth every 6 (six) hours as needed for pain., Disp: , Rfl:  .  aspirin EC 81 MG tablet, Take 81 mg by mouth daily., Disp: , Rfl:  .  buPROPion (WELLBUTRIN XL) 150 MG 24 hr tablet, Take 1 tablet (150 mg total) by mouth daily., Disp: 30 tablet, Rfl: 0 .  clopidogrel (PLAVIX) 75 MG tablet, Take 75 mg by mouth daily. , Disp: , Rfl:  .  dextromethorphan-guaiFENesin (MUCINEX DM) 30-600 MG per 12 hr tablet, Take 1 tablet by mouth 2 (two) times daily., Disp: 14 tablet, Rfl: 0 .  docusate sodium (COLACE) 100 MG capsule, Take 100 mg by mouth daily., Disp: , Rfl:  .  Fiber CAPS, Take 2 capsules by mouth daily. , Disp: , Rfl:  .  fish oil-omega-3 fatty acids 1000 MG capsule, Take 1 g by mouth 2 (two) times daily. , Disp: , Rfl:  .  Fluticasone Furoate-Vilanterol (BREO ELLIPTA IN), Inhale 1 puff into the lungs daily., Disp: , Rfl:  .  insulin glargine (LANTUS) 100 UNIT/ML injection, Inject 0.3 mLs (30 Units total) into the skin daily. Inject 10 units each morning. Inject evening dose as follows: 16 units up to cbg of 250.  20 units if cbg is higher than 250., Disp: 10 mL, Rfl: 11 .  nitroGLYCERIN (NITROLINGUAL) 0.4 MG/SPRAY spray,  Place 1 spray under the tongue every 5 (five) minutes as needed for chest pain., Disp: , Rfl:  .  omeprazole (PRILOSEC) 20 MG capsule, Take 20 mg by mouth daily. , Disp: , Rfl:  .  ondansetron (ZOFRAN-ODT) 4 MG disintegrating tablet, Take 4 mg by mouth every 8 (eight) hours as needed for nausea., Disp: , Rfl:  .  rosuvastatin (CRESTOR) 20 MG tablet, Take 20 mg by mouth daily., Disp: , Rfl:  .  albuterol (PROVENTIL HFA;VENTOLIN HFA) 108 (90 BASE) MCG/ACT inhaler, Inhale 2 puffs into the lungs every 6 (six) hours as needed for wheezing or shortness of breath. (Patient not taking: Reported on 11/04/2014), Disp: 1 Inhaler, Rfl: 0

## 2014-11-04 NOTE — Assessment & Plan Note (Signed)
She was advised at length to quit smoking today. Specifically, I recommended that she join one of our smoking cessation classes.  As she is still an active smoker and greater than age 64 and she has smoked more than 30 pack years she qualifies for lung cancer screening. We discussed the risks and benefits including the hypothenar positive rate. However, she is in the highest risk category as far as I'm concerned regarding lung cancer screening and so I feel that it's wise for her to proceed. We will order a low-dose CT scan of the lungs.

## 2014-11-04 NOTE — Assessment & Plan Note (Signed)
She has chronic hypoxemic respiratory failure secondary to her COPD. Today at rest her oxygenation was normal but she can only walk about 100 feet due to pain in her legs. I feel that she does need oxygen when she exerts herself.  Plan: Keep oxygen at home to use it 3 L with exertion Home ambulatory oximetry testing again, if positive then we'll prescribe a portable oxygen concentrator Continue oxygen at night, 3 L continuously

## 2014-11-04 NOTE — Patient Instructions (Addendum)
Stop smoking Try going to one of the [facility] classes to help you quit smoking You Spiriva in addition to [facility] Call us and let us know what inhaled therapies year insurance covers We will refer you to pulmonary rehabilitation We will prescribe a portable oxygen concentrator We will arrange a lung cancer screening CT scan We will see you back in 4-6 weeks or sooner if needed

## 2014-11-04 NOTE — Assessment & Plan Note (Signed)
Her shortness of breath is clearly due to her severe COPD. I reviewed the images from her 2013 CT chest which shows moderate to severe centrilobular emphysema primarily in an upper lobe distribution.  She continues to smoke cigarettes which is contributed to her ongoing dyspnea, not to mention the fact that it's making her peripheral vascular disease and cerebrovascular disease worse. She was educated at length to quit, see discussion above.   One should also provide patients with annual immunizations and consider therapy for prevention of COPD exacerbations (ie. roflumilast or azithromycin) when appopriate.  -Spirometry today -O2 therapy: I think it would help if she use this continuously, we will prescribe a portable oxygen concentrator -Immunizations: I have asked her to clarify whether or not she's had the pneumonia vaccine with her primary care physician -Tobacco use: See above -Exercise: Referral to pulmonary rehabilitation -Bronchodilator therapy: Continue Breo, add Spiriva, I have asked her to help [place] clarify what medications are covered by her insurance formulary -Exacerbation prevention: Quit smoking, add Spiriva

## 2014-11-10 ENCOUNTER — Inpatient Hospital Stay: Admission: RE | Admit: 2014-11-10 | Payer: Self-pay | Source: Ambulatory Visit

## 2014-11-16 ENCOUNTER — Ambulatory Visit (INDEPENDENT_AMBULATORY_CARE_PROVIDER_SITE_OTHER)
Admission: RE | Admit: 2014-11-16 | Discharge: 2014-11-16 | Disposition: A | Discharge status: Home / Self Care | Payer: Medicare Other | Source: Ambulatory Visit | Attending: Pulmonary Disease | Admitting: Pulmonary Disease

## 2014-11-16 DIAGNOSIS — F17210 Nicotine dependence, cigarettes, uncomplicated: Secondary | ICD-10-CM | POA: Diagnosis not present

## 2014-11-16 DIAGNOSIS — Z122 Encounter for screening for malignant neoplasm of respiratory organs: Secondary | ICD-10-CM | POA: Diagnosis not present

## 2014-11-16 DIAGNOSIS — J432 Centrilobular emphysema: Secondary | ICD-10-CM

## 2014-11-16 DIAGNOSIS — Z720 Tobacco use: Secondary | ICD-10-CM | POA: Diagnosis not present

## 2014-11-16 DIAGNOSIS — Z87891 Personal history of nicotine dependence: Secondary | ICD-10-CM | POA: Diagnosis not present

## 2014-11-18 ENCOUNTER — Encounter: Payer: Self-pay | Admitting: Vascular Surgery

## 2014-11-18 DIAGNOSIS — J440 Chronic obstructive pulmonary disease with acute lower respiratory infection: Secondary | ICD-10-CM | POA: Diagnosis not present

## 2014-11-21 ENCOUNTER — Telehealth: Payer: Self-pay | Admitting: Acute Care

## 2014-11-21 NOTE — Telephone Encounter (Signed)
I called this patient at Dr, Brenda [name] request to give her the result of the LDCT she had done 11/17/14.There was no answer at the home phone. I have asked her to return my call, and left my contact information. I will follow up if I do not get a return call from her today ( 11/21/14).

## 2014-11-22 ENCOUNTER — Telehealth: Payer: Self-pay | Admitting: Acute Care

## 2014-11-22 ENCOUNTER — Ambulatory Visit (HOSPITAL_COMMUNITY)
Admission: RE | Admit: 2014-11-22 | Discharge: 2014-11-22 | Disposition: A | Discharge status: Home / Self Care | Payer: Medicare Other | Source: Ambulatory Visit | Attending: Vascular Surgery | Admitting: Vascular Surgery

## 2014-11-22 ENCOUNTER — Ambulatory Visit (INDEPENDENT_AMBULATORY_CARE_PROVIDER_SITE_OTHER): Payer: Medicare Other | Admitting: Vascular Surgery

## 2014-11-22 ENCOUNTER — Encounter: Payer: Self-pay | Admitting: Vascular Surgery

## 2014-11-22 VITALS — BP 128/71 | HR 77 | Resp 16 | Ht 65.75 in | Wt 130.8 lb

## 2014-11-22 DIAGNOSIS — I739 Peripheral vascular disease, unspecified: Secondary | ICD-10-CM

## 2014-11-22 DIAGNOSIS — Z48812 Encounter for surgical aftercare following surgery on the circulatory system: Secondary | ICD-10-CM

## 2014-11-22 DIAGNOSIS — I70219 Atherosclerosis of native arteries of extremities with intermittent claudication, unspecified extremity: Secondary | ICD-10-CM

## 2014-11-22 NOTE — Addendum Note (Signed)
Addended by: Dorthula Rue L on: 11/22/2014 03:12 PM   Modules accepted: Orders

## 2014-11-22 NOTE — Telephone Encounter (Signed)
Attempted to call results of LDCT again today as I had not received a return call.I left another message asking that [name] return my call, with my contact information.

## 2014-11-22 NOTE — Progress Notes (Signed)
Patient presents today for follow-up of her lower extremity bypasses. She is status post right femoral to popliteal bypass in 1999. In 2014 she had a area of please occlusion in her native popliteal artery below this bypass and underwent revision with a jump from her old femoral above-knee bypass to the tibioperoneal trunk with small saphenous vein. She had a left femoral-popliteal bypass in 2000 and is never had any revision of this. She reports a mild calf claudication symptoms on the right which are not limiting to her. She reports that she had severe pneumonia in March of this year and is still not fully recovered regarding her stamina. He has no lower from the tissue loss  Past Medical History  Diagnosis Date  . History of DVT (deep vein thrombosis)   . Coronary artery disease     multiple stens  . COPD (chronic obstructive pulmonary disease)   . Transient ischemic attack   . Depression   . Hx of radiation therapy 01/16/09 - 03/06/09    base of tongue, right neck node  . Peripheral arterial disease     status post bilateral femoropopliteal bypass graft performed by Dr. Sherren Mocha Kieffer Blatz in the past with significant right lower extremity lifestyle limiting claudication  . Pneumonia   . Hypercholesteremia   . Chronic bronchitis   . Shortness of breath     "w/exertion and sometimes when laying down" (11/26/2012)  . Type II diabetes mellitus   . History of blood transfusion 1955  . GERD (gastroesophageal reflux disease)   . H/O hiatal hernia   . Stroke 2000    left side weakness remains (11/26/2012)  . Arthritis     "hands" (11/26/2012)  . 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 Myocardial infarction 2005    dr Ellyn Hack  with [facility]  . Fatigue 08/23/2013  . Carotid artery occlusion     History  Substance Use Topics  . Smoking status: Current Every Day Smoker -- 0.50 packs/day for 50 years    Types: Cigarettes  . Smokeless tobacco: Never Used  . Alcohol  Use: 6.0 oz/week    10 Glasses of wine per week    Family History  Problem Relation Age of Onset  . Cancer Mother     pancreatic  . Diabetes Mother   . Hyperlipidemia Mother   . Hypertension Mother   . Other Mother     varicose vein  . Cancer Father 72    throat  . Heart disease Father   . Heart attack Father   . Cancer Sister     breast  . Diabetes Sister   . Deep vein thrombosis Brother   . Diabetes Brother   . Hearing loss Brother   . Hyperlipidemia Brother   . Hypertension Brother   . Heart attack Brother   . Clotting disorder Brother   . Diabetes Son   . Hyperlipidemia Son     Allergies  Allergen Reactions  . Tape Hives and Other (See Comments)    USE PAPER TAPE ONLY- Adhesive peels off skin-makes pt. Raw.  Maryjo Rochester [Nicotine] Rash    To the PATCH only, breakouts on skin     Current outpatient prescriptions:  .  acetaminophen (TYLENOL) 500 MG tablet, Take 1,000 mg by mouth every 6 (six) hours as needed for pain., Disp: , Rfl:  .  aspirin EC 81 MG tablet, Take 81 mg by mouth daily., Disp: ,  Rfl:  .  buPROPion (WELLBUTRIN XL) 150 MG 24 hr tablet, Take 1 tablet (150 mg total) by mouth daily., Disp: 30 tablet, Rfl: 0 .  clopidogrel (PLAVIX) 75 MG tablet, Take 75 mg by mouth daily. , Disp: , Rfl:  .  dextromethorphan-guaiFENesin (MUCINEX DM) 30-600 MG per 12 hr tablet, Take 1 tablet by mouth 2 (two) times daily., Disp: 14 tablet, Rfl: 0 .  docusate sodium (COLACE) 100 MG capsule, Take 100 mg by mouth daily., Disp: , Rfl:  .  fish oil-omega-3 fatty acids 1000 MG capsule, Take 1 g by mouth 2 (two) times daily. , Disp: , Rfl:  .  Fluticasone Furoate-Vilanterol (BREO ELLIPTA IN), Inhale 1 puff into the lungs daily., Disp: , Rfl:  .  insulin glargine (LANTUS) 100 UNIT/ML injection, Inject 0.3 mLs (30 Units total) into the skin daily. Inject 10 units each morning. Inject evening dose as follows: 16 units up to cbg of 250.  20 units if cbg is higher than 250., Disp: 10 mL,  Rfl: 11 .  nitroGLYCERIN (NITROLINGUAL) 0.4 MG/SPRAY spray, Place 1 spray under the tongue every 5 (five) minutes as needed for chest pain., Disp: , Rfl:  .  omeprazole (PRILOSEC) 20 MG capsule, Take 20 mg by mouth daily. , Disp: , Rfl:  .  ondansetron (ZOFRAN-ODT) 4 MG disintegrating tablet, Take 4 mg by mouth every 8 (eight) hours as needed for nausea., Disp: , Rfl:  .  rosuvastatin (CRESTOR) 20 MG tablet, Take 20 mg by mouth daily., Disp: , Rfl:  .  albuterol (PROVENTIL HFA;VENTOLIN HFA) 108 (90 BASE) MCG/ACT inhaler, Inhale 2 puffs into the lungs every 6 (six) hours as needed for wheezing or shortness of breath. (Patient not taking: Reported on 11/04/2014), Disp: 1 Inhaler, Rfl: 0 .  Fiber CAPS, Take 2 capsules by mouth daily. , Disp: , Rfl:   Filed Vitals:   11/22/14 1222  BP: 128/71  Pulse: 77  Resp: 16  Height: 5' 5.75" (1.67 m)  Weight: 130 lb 12.8 oz (59.33 kg)    Body mass index is 21.27 kg/(m^2).       Physical exam well-developed well-nourished thin female no acute distress 2+ popliteal pulses bilaterally I do not feel pedal pulses on the right. She has a 2+ left pedal dorsalis pedis pulse Skin is intact with no tissue loss  She underwent noninvasive vascular studies are TODAY. This shows an ankle arm index of 0.65 on the right and greater than 1 on the left her duplex imaging of the left bypasses no evidence of stenosis. There are elevated velocities in the distal anastomosis on the right.  Impression and plan stable bilateral lower extremity bypasses. Custody elevated velocities at the right distal anastomosis. In reviewing my operative report that they're very severe calcific target vessel was a 1.5 mm anterior tibial runoff vessel. I do not feel that there is any indication for further evaluation with her stable noninvasive studies and tolerable mild claudication. Will be seen again in 6 months with repeat noninvasive studies in office

## 2014-11-23 ENCOUNTER — Encounter (HOSPITAL_BASED_OUTPATIENT_CLINIC_OR_DEPARTMENT_OTHER): Payer: Self-pay | Admitting: *Deleted

## 2014-11-23 ENCOUNTER — Emergency Department (HOSPITAL_BASED_OUTPATIENT_CLINIC_OR_DEPARTMENT_OTHER): Payer: Medicare Other

## 2014-11-23 ENCOUNTER — Emergency Department (HOSPITAL_BASED_OUTPATIENT_CLINIC_OR_DEPARTMENT_OTHER)
Admission: EM | Admit: 2014-11-23 | Discharge: 2014-11-23 | Disposition: A | Discharge status: Home / Self Care | Payer: Medicare Other | Attending: Emergency Medicine | Admitting: Emergency Medicine

## 2014-11-23 DIAGNOSIS — S8002XA Contusion of left knee, initial encounter: Secondary | ICD-10-CM | POA: Insufficient documentation

## 2014-11-23 DIAGNOSIS — J449 Chronic obstructive pulmonary disease, unspecified: Secondary | ICD-10-CM | POA: Diagnosis not present

## 2014-11-23 DIAGNOSIS — W010XXA Fall on same level from slipping, tripping and stumbling without subsequent striking against object, initial encounter: Secondary | ICD-10-CM | POA: Diagnosis not present

## 2014-11-23 DIAGNOSIS — Y998 Other external cause status: Secondary | ICD-10-CM | POA: Diagnosis not present

## 2014-11-23 DIAGNOSIS — Z7901 Long term (current) use of anticoagulants: Secondary | ICD-10-CM | POA: Diagnosis not present

## 2014-11-23 DIAGNOSIS — Z79899 Other long term (current) drug therapy: Secondary | ICD-10-CM | POA: Insufficient documentation

## 2014-11-23 DIAGNOSIS — Z794 Long term (current) use of insulin: Secondary | ICD-10-CM | POA: Diagnosis not present

## 2014-11-23 DIAGNOSIS — Y92137 Garden or yard on military base as the place of occurrence of the external cause: Secondary | ICD-10-CM | POA: Diagnosis not present

## 2014-11-23 DIAGNOSIS — Y9301 Activity, walking, marching and hiking: Secondary | ICD-10-CM | POA: Insufficient documentation

## 2014-11-23 DIAGNOSIS — I739 Peripheral vascular disease, unspecified: Secondary | ICD-10-CM | POA: Insufficient documentation

## 2014-11-23 DIAGNOSIS — F329 Major depressive disorder, single episode, unspecified: Secondary | ICD-10-CM | POA: Insufficient documentation

## 2014-11-23 DIAGNOSIS — K219 Gastro-esophageal reflux disease without esophagitis: Secondary | ICD-10-CM | POA: Diagnosis not present

## 2014-11-23 DIAGNOSIS — I2510 Atherosclerotic heart disease of native coronary artery without angina pectoris: Secondary | ICD-10-CM | POA: Insufficient documentation

## 2014-11-23 DIAGNOSIS — S93501A Unspecified sprain of right great toe, initial encounter: Secondary | ICD-10-CM | POA: Diagnosis not present

## 2014-11-23 DIAGNOSIS — M79671 Pain in right foot: Secondary | ICD-10-CM | POA: Diagnosis not present

## 2014-11-23 DIAGNOSIS — M1990 Unspecified osteoarthritis, unspecified site: Secondary | ICD-10-CM | POA: Insufficient documentation

## 2014-11-23 DIAGNOSIS — Z8701 Personal history of pneumonia (recurrent): Secondary | ICD-10-CM | POA: Insufficient documentation

## 2014-11-23 DIAGNOSIS — Z720 Tobacco use: Secondary | ICD-10-CM | POA: Diagnosis not present

## 2014-11-23 DIAGNOSIS — Z85810 Personal history of malignant neoplasm of tongue: Secondary | ICD-10-CM | POA: Diagnosis not present

## 2014-11-23 DIAGNOSIS — S99921A Unspecified injury of right foot, initial encounter: Secondary | ICD-10-CM | POA: Diagnosis not present

## 2014-11-23 DIAGNOSIS — Z923 Personal history of irradiation: Secondary | ICD-10-CM | POA: Insufficient documentation

## 2014-11-23 DIAGNOSIS — Z9889 Other specified postprocedural states: Secondary | ICD-10-CM | POA: Insufficient documentation

## 2014-11-23 DIAGNOSIS — W19XXXA Unspecified fall, initial encounter: Secondary | ICD-10-CM

## 2014-11-23 DIAGNOSIS — E119 Type 2 diabetes mellitus without complications: Secondary | ICD-10-CM | POA: Diagnosis not present

## 2014-11-23 DIAGNOSIS — Z7982 Long term (current) use of aspirin: Secondary | ICD-10-CM | POA: Diagnosis not present

## 2014-11-23 DIAGNOSIS — E780 Pure hypercholesterolemia: Secondary | ICD-10-CM | POA: Diagnosis not present

## 2014-11-23 DIAGNOSIS — Z86718 Personal history of other venous thrombosis and embolism: Secondary | ICD-10-CM | POA: Insufficient documentation

## 2014-11-23 DIAGNOSIS — M25562 Pain in left knee: Secondary | ICD-10-CM | POA: Diagnosis not present

## 2014-11-23 DIAGNOSIS — S8992XA Unspecified injury of left lower leg, initial encounter: Secondary | ICD-10-CM | POA: Diagnosis not present

## 2014-11-23 NOTE — ED Notes (Signed)
Patient states she was walking in her garden last night and tripped and fell.  Landed on her knees.  Now has complaints of right great toe pain with bruising and swelling.

## 2014-11-23 NOTE — ED Notes (Signed)
Pt comes in today with a c/o right foot and left knee pain. Pt states that she was working in the garden last night when she tripped and fell onto her knees. Pt states that she cannot bear total weight on her right foot.

## 2014-11-23 NOTE — ED Provider Notes (Signed)
CSN: [id number]     Arrival date & time 11/23/14  1136 History   First MD Initiated Contact with Patient 11/23/14 1201     Chief Complaint  Patient presents with  . Foot Injury    right     (Consider location/radiation/quality/duration/timing/severity/associated sxs/prior Treatment) HPI Comments: 64 year old female presenting with right big toe pain and left knee pain after tripping in her garden yesterday evening causing her to fall forward and landed onto her knees. She believes her toe may have gotten stuck, ice and elevated her foot last night, took 800 mg ibuprofen with minimal relief. Pain increased with walking, especially to her right toe. States she broke her left knee in the past, and her knee is causing her pain with walking only. Denies numbness or tingling.  Patient is a 64 y.o. female presenting with foot injury. The history is provided by the patient.  Foot Injury   Past Medical History  Diagnosis Date  . History of DVT (deep vein thrombosis)   . Coronary artery disease     multiple stens  . COPD (chronic obstructive pulmonary disease)   . Transient ischemic attack   . Depression   . Hx of radiation therapy 01/16/09 - 03/06/09    base of tongue, right neck node  . Peripheral arterial disease     status post bilateral femoropopliteal bypass graft performed by Dr. Sherren Mocha early in the past with significant right lower extremity lifestyle limiting claudication  . Pneumonia   . Hypercholesteremia   . Chronic bronchitis   . Shortness of breath     "w/exertion and sometimes when laying down" (11/26/2012)  . Type II diabetes mellitus   . History of blood transfusion 1955  . GERD (gastroesophageal reflux disease)   . H/O hiatal hernia   . Stroke 2000    left side weakness remains (11/26/2012)  . Arthritis     "hands" (11/26/2012)  . 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 Myocardial infarction 2005    dr Ellyn Hack  with SE  . Fatigue  08/23/2013  . Carotid artery occlusion    Past Surgical History  Procedure Laterality Date  . Tee without cardioversion  07/26/2011    Procedure: TRANSESOPHAGEAL ECHOCARDIOGRAM (TEE);  Surgeon: Pixie Casino, MD;  Location: [facility] ENDOSCOPY;  Service: Cardiovascular;  Laterality: N/A;  . Esophagogastroduodenoscopy  12/20/2011    Procedure: ESOPHAGOGASTRODUODENOSCOPY (EGD);  Surgeon: Beryle Beams, MD;  Location: [facility];  Service: Endoscopy;  Laterality: N/A;  EGD with balloon dilation  . Savory dilation  12/20/2011    Procedure: SAVORY DILATION;  Surgeon: Beryle Beams, MD;  Location: [facility] ENDOSCOPY;  Service: Endoscopy;  Laterality: N/A;  . Tonsillectomy  1969  . Appendectomy  1962  . Cardiac catheterization  1999; 11/26/2012  . Coronary angioplasty with stent placement  1999--2005    "i think I have 3-5 total" (11/26/2012)  . Femoral bypass  1999; 2000    Archie Endo 11/26/2012  . Carpal tunnel release Right 1980's?  . Vaginal hysterectomy  1988  . Cesarean section  1978  . Tubal ligation  1988  . Cataract extraction w/ intraocular lens implant Right 2011  . Ankle fracture surgery Right 2007    "got a metal plate and 8 screws in it" (11/26/2012)  . Bypass graft popliteal to popliteal Right 02/05/2013    Procedure: BYPASS GRAFT ABOVE KNEE POPLITEAL TO BELOW KNEE POPLITEAL WITH SMALL SAPHANOUS VEIN;  Surgeon:  Rosetta Posner, MD;  Location: [facility] OR;  Service: Vascular;  Laterality: Right;  . Eye surgery     Family History  Problem Relation Age of Onset  . Cancer Mother     pancreatic  . Diabetes Mother   . Hyperlipidemia Mother   . Hypertension Mother   . Other Mother     varicose vein  . Cancer Father 72    throat  . Heart disease Father   . Heart attack Father   . Cancer Sister     breast  . Diabetes Sister   . Deep vein thrombosis Brother   . Diabetes Brother   . Hearing loss Brother   . Hyperlipidemia Brother   . Hypertension Brother   . Heart attack Brother   . Clotting  disorder Brother   . Diabetes Son   . Hyperlipidemia Son    History  Substance Use Topics  . Smoking status: Current Every Day Smoker -- 0.50 packs/day for 50 years    Types: Cigarettes  . Smokeless tobacco: Never Used  . Alcohol Use: 6.0 oz/week    10 Glasses of wine per week   OB History    No data available     Review of Systems  Musculoskeletal:       + R big toe pain/swelling and L knee pain.  Skin: Positive for color change (bruising to L knee).  All other systems reviewed and are negative.     Allergies  Tape and Nicoderm  Home Medications   Prior to Admission medications   Medication Sig Start Date End Date Taking? Authorizing Provider  acetaminophen (TYLENOL) 500 MG tablet Take 1,000 mg by mouth every 6 (six) hours as needed for pain.    Historical Provider, MD  albuterol (PROVENTIL HFA;VENTOLIN HFA) 108 (90 BASE) MCG/ACT inhaler Inhale 2 puffs into the lungs every 6 (six) hours as needed for wheezing or shortness of breath. Patient not taking: Reported on 11/04/2014 08/18/14   Allie Bossier, MD  aspirin EC 81 MG tablet Take 81 mg by mouth daily.    Historical Provider, MD  buPROPion (WELLBUTRIN XL) 150 MG 24 hr tablet Take 1 tablet (150 mg total) by mouth daily. 08/18/14   Allie Bossier, MD  clopidogrel (PLAVIX) 75 MG tablet Take 75 mg by mouth daily.     Historical Provider, MD  dextromethorphan-guaiFENesin (MUCINEX DM) 30-600 MG per 12 hr tablet Take 1 tablet by mouth 2 (two) times daily. 08/18/14   Allie Bossier, MD  docusate sodium (COLACE) 100 MG capsule Take 100 mg by mouth daily.    Historical Provider, MD  Fiber CAPS Take 2 capsules by mouth daily.     Historical Provider, MD  fish oil-omega-3 fatty acids 1000 MG capsule Take 1 g by mouth 2 (two) times daily.     Historical Provider, MD  Fluticasone Furoate-Vilanterol (BREO ELLIPTA IN) Inhale 1 puff into the lungs daily.    Historical Provider, MD  insulin glargine (LANTUS) 100 UNIT/ML injection Inject 0.3  mLs (30 Units total) into the skin daily. Inject 10 units each morning. Inject evening dose as follows: 16 units up to cbg of 250.  20 units if cbg is higher than 250. 08/18/14   Allie Bossier, MD  nitroGLYCERIN (NITROLINGUAL) 0.4 MG/SPRAY spray Place 1 spray under the tongue every 5 (five) minutes as needed for chest pain.    Historical Provider, MD  omeprazole (PRILOSEC) 20 MG capsule Take 20 mg by mouth daily.  Historical Provider, MD  ondansetron (ZOFRAN-ODT) 4 MG disintegrating tablet Take 4 mg by mouth every 8 (eight) hours as needed for nausea.    Historical Provider, MD  rosuvastatin (CRESTOR) 20 MG tablet Take 20 mg by mouth daily.    Historical Provider, MD   BP 143/73 mmHg  Pulse 92  Temp(Src) 98.2 F (36.8 C) (Oral)  Resp 18  Ht 5' 5.75" (1.67 m)  Wt 129 lb (58.514 kg)  BMI 20.98 kg/m2  SpO2 94% Physical Exam  Constitutional: She is oriented to person, place, and time. She appears well-developed and well-nourished. No distress.  HENT:  Head: Normocephalic and atraumatic.  Mouth/Throat: Oropharynx is clear and moist.  Eyes: Conjunctivae and EOM are normal.  Neck: Normal range of motion. Neck supple.  Cardiovascular: Normal rate, regular rhythm and normal heart sounds.   Pulses:      Dorsalis pedis pulses are 2+ on the right side, and 2+ on the left side.       Posterior tibial pulses are 2+ on the right side, and 2+ on the left side.  Pulmonary/Chest: Effort normal and breath sounds normal. No respiratory distress.  Musculoskeletal: Normal range of motion. She exhibits no edema.  R foot- TTP over 1st MTP with mild swelling. Able to wiggle toe. Cap refill < 2 seconds. No deformity. R ankle and knee normal. L knee- TTP inferior to patella with mild bruising and swelling. FROM, pain with flexion.  Neurological: She is alert and oriented to person, place, and time. No sensory deficit.  Skin: Skin is warm and dry.  Psychiatric: She has a normal mood and affect. Her behavior  is normal.  Nursing note and vitals reviewed.   ED Course  Procedures (including critical care time) Labs Review Labs Reviewed - No data to display  Imaging Review Dg Knee Complete 4 Views Left  11/23/2014   CLINICAL DATA:  Fall last night in the garden. Left anterior knee pain. Initial encounter.  EXAM: LEFT KNEE - COMPLETE 4+ VIEW  COMPARISON:  03/01/2009  FINDINGS: There is no evidence of fracture, dislocation, or joint effusion. There is no evidence of arthropathy or other focal bone abnormality. Soft tissues are unremarkable.  Diffuse osteopenia noted. Extensive peripheral vascular calcification also seen as well as multiple surgical clips.  IMPRESSION: No acute findings.   Electronically Signed   By: Earle Gell M.D.   On: 11/23/2014 12:53   Dg Foot Complete Right  11/23/2014   CLINICAL DATA:  Initial encounter for fall last night and gardening with pain and swelling over right foot.  EXAM: RIGHT FOOT COMPLETE - 3+ VIEW  COMPARISON:  10/20/2005  FINDINGS: Three views study shows diffuse bony demineralization. No evidence of fracture. No subluxation or dislocation. Patient is status post ORIF for ankle fracture.  IMPRESSION: No acute bony findings.   Electronically Signed   By: Misty Stanley M.D.   On: 11/23/2014 12:51     EKG Interpretation None      MDM   Final diagnoses:  Knee contusion, left, initial encounter  Sprain of right great toe, initial encounter  Fall from slip, trip, or stumble, initial encounter   Neurovascularly intact. Xrays negative. Able to ambulate. Crutches, postop shoe offered, patient refuses as she said she has these at home. Pain meds also offered, patient would rather take ibuprofen at home. She has an appointment with her PCP next week. Stable for discharge. Return precautions given. Patient states understanding of treatment care plan and is agreeable.  Carman Ching, PA-C 11/23/14 1306  Evelina Bucy, MD 11/23/14 1447

## 2014-11-23 NOTE — Discharge Instructions (Signed)
Foot Sprain The muscles and cord like structures which attach muscle to bone (tendons) that surround the feet are made up of units. A foot sprain can occur at the weakest spot in any of these units. This condition is most often caused by injury to or overuse of the foot, as from playing contact sports, or aggravating a previous injury, or from poor conditioning, or obesity. SYMPTOMS  Pain with movement of the foot.  Tenderness and swelling at the injury site.  Loss of strength is present in moderate or severe sprains. THE THREE GRADES OR SEVERITY OF FOOT SPRAIN ARE:  Mild (Grade I): Slightly pulled muscle without tearing of muscle or tendon fibers or loss of strength.  Moderate (Grade II): Tearing of fibers in a muscle, tendon, or at the attachment to bone, with small decrease in strength.  Severe (Grade III): Rupture of the muscle-tendon-bone attachment, with separation of fibers. Severe sprain requires surgical repair. Often repeating (chronic) sprains are caused by overuse. Sudden (acute) sprains are caused by direct injury or over-use. DIAGNOSIS  Diagnosis of this condition is usually by your own observation. If problems continue, a caregiver may be required for further evaluation and treatment. X-rays may be required to make sure there are not breaks in the bones (fractures) present. Continued problems may require physical therapy for treatment. PREVENTION  Use strength and conditioning exercises appropriate for your sport.  Warm up properly prior to working out.  Use athletic shoes that are made for the sport you are participating in.  Allow adequate time for healing. Early return to activities makes repeat injury more likely, and can lead to an unstable arthritic foot that can result in prolonged disability. Mild sprains generally heal in 3 to 10 days, with moderate and severe sprains taking 2 to 10 weeks. Your caregiver can help you determine the proper time required for  healing. HOME CARE INSTRUCTIONS   Apply ice to the injury for 15-20 minutes, 03-04 times per day. Put the ice in a plastic bag and place a towel between the bag of ice and your skin.  An elastic wrap (like an Ace bandage) may be used to keep swelling down.  Keep foot above the level of the heart, or at least raised on a footstool, when swelling and pain are present.  Try to avoid use other than gentle range of motion while the foot is painful. Do not resume use until instructed by your caregiver. Then begin use gradually, not increasing use to the point of pain. If pain does develop, decrease use and continue the above measures, gradually increasing activities that do not cause discomfort, until you gradually achieve normal use.  Use crutches if and as instructed, and for the length of time instructed.  Keep injured foot and ankle wrapped between treatments.  Massage foot and ankle for comfort and to keep swelling down. Massage from the toes up towards the knee.  Only take over-the-counter or prescription medicines for pain, discomfort, or fever as directed by your caregiver. SEEK IMMEDIATE MEDICAL CARE IF:   Your pain and swelling increase, or pain is not controlled with medications.  You have loss of feeling in your foot or your foot turns cold or blue.  You develop new, unexplained symptoms, or an increase of the symptoms that brought you to your caregiver. MAKE SURE YOU:   Understand these instructions.  Will watch your condition.  Will get help right away if you are not doing well or get worse. Document Released:  11/09/2001 Document Revised: 08/12/2011 Document Reviewed: 01/07/2008 [employer] Patient Information 2015 [place], [place]. This information is not intended to replace advice given to you by your health care provider. Make sure you discuss any questions you have with your health care provider.  Contusion A contusion is a deep bruise. Contusions are the result of an  injury that caused bleeding under the skin. The contusion may turn blue, purple, or yellow. Minor injuries will give you a painless contusion, but more severe contusions may stay painful and swollen for a few weeks.  CAUSES  A contusion is usually caused by a blow, trauma, or direct force to an area of the body. SYMPTOMS   Swelling and redness of the injured area.  Bruising of the injured area.  Tenderness and soreness of the injured area.  Pain. DIAGNOSIS  The diagnosis can be made by taking a history and physical exam. An X-ray, CT scan, or MRI may be needed to determine if there were any associated injuries, such as fractures. TREATMENT  Specific treatment will depend on what area of the body was injured. In general, the best treatment for a contusion is resting, icing, elevating, and applying cold compresses to the injured area. Over-the-counter medicines may also be recommended for pain control. Ask your caregiver what the best treatment is for your contusion. HOME CARE INSTRUCTIONS   Put ice on the injured area.  Put ice in a plastic bag.  Place a towel between your skin and the bag.  Leave the ice on for 15-20 minutes, 3-4 times a day, or as directed by your health care provider.  Only take over-the-counter or prescription medicines for pain, discomfort, or fever as directed by your caregiver. Your caregiver may recommend avoiding anti-inflammatory medicines (aspirin, ibuprofen, and naproxen) for 48 hours because these medicines may increase bruising.  Rest the injured area.  If possible, elevate the injured area to reduce swelling. SEEK IMMEDIATE MEDICAL CARE IF:   You have increased bruising or swelling.  You have pain that is getting worse.  Your swelling or pain is not relieved with medicines. MAKE SURE YOU:   Understand these instructions.  Will watch your condition.  Will get help right away if you are not doing well or get worse. Document Released:  02/27/2005 Document Revised: 05/25/2013 Document Reviewed: 03/25/2011 [facility] Patient Information 2015 [place], [place]. This information is not intended to replace advice given to you by your health care provider. Make sure you discuss any questions you have with your health care provider. RICE: Routine Care for Injuries The routine care of many injuries includes Rest, Ice, Compression, and Elevation (RICE). HOME CARE INSTRUCTIONS  Rest is needed to allow your body to heal. Routine activities can usually be resumed when comfortable. Injured tendons and bones can take up to 6 weeks to heal. Tendons are the cord-like structures that attach muscle to bone.  Ice following an injury helps keep the swelling down and reduces pain.  Put ice in a plastic bag.  Place a towel between your skin and the bag.  Leave the ice on for 15-20 minutes, 3-4 times a day, or as directed by your health care provider. Do this while awake, for the first 24 to 48 hours. After that, continue as directed by your caregiver.  Compression helps keep swelling down. It also gives support and helps with discomfort. If an elastic bandage has been applied, it should be removed and reapplied every 3 to 4 hours. It should not be applied tightly,  but firmly enough to keep swelling down. Watch fingers or toes for swelling, bluish discoloration, coldness, numbness, or excessive pain. If any of these problems occur, remove the bandage and reapply loosely. Contact your caregiver if these problems continue.  Elevation helps reduce swelling and decreases pain. With extremities, such as the arms, hands, legs, and feet, the injured area should be placed near or above the level of the heart, if possible. SEEK IMMEDIATE MEDICAL CARE IF:  You have persistent pain and swelling.  You develop redness, numbness, or unexpected weakness.  Your symptoms are getting worse rather than improving after several days. These symptoms may indicate that  further evaluation or further X-rays are needed. Sometimes, X-rays may not show a small broken bone (fracture) until 1 week or 10 days later. Make a follow-up appointment with your caregiver. Ask when your X-ray results will be ready. Make sure you get your X-ray results. Document Released: 09/01/2000 Document Revised: 05/25/2013 Document Reviewed: 10/19/2010 [facility] Patient Information 2015 [place], [place]. This information is not intended to replace advice given to you by your health care provider. Make sure you discuss any questions you have with your health care provider.

## 2014-11-24 ENCOUNTER — Telehealth: Payer: Self-pay | Admitting: Acute Care

## 2014-11-24 NOTE — Telephone Encounter (Signed)
I have been unable to speak with Brenda [name] despite attempts on 11/21/14, 11/22/14,11/23/14, and 11/24/14. I have left a message on her machine today telling her I will put a letter in the mail with her scan results, and my contact information and  Have asked her to call me with any questions or concerns.

## 2014-11-30 DIAGNOSIS — E118 Type 2 diabetes mellitus with unspecified complications: Secondary | ICD-10-CM | POA: Diagnosis not present

## 2014-11-30 DIAGNOSIS — J439 Emphysema, unspecified: Secondary | ICD-10-CM | POA: Diagnosis not present

## 2014-12-09 ENCOUNTER — Telehealth (HOSPITAL_COMMUNITY): Payer: Self-pay

## 2014-12-09 NOTE — Telephone Encounter (Signed)
Called patient regarding entrance to [facility].  Patient states that they are interested in attending the program.  Kaeleen is going to verify insurance coverage and follow up.

## 2014-12-15 ENCOUNTER — Telehealth: Payer: Self-pay | Admitting: Pulmonary Disease

## 2014-12-15 NOTE — Telephone Encounter (Signed)
lmtcb x1 

## 2014-12-16 NOTE — Telephone Encounter (Signed)
lmtcb x2 for pt. 

## 2014-12-18 DIAGNOSIS — J440 Chronic obstructive pulmonary disease with acute lower respiratory infection: Secondary | ICD-10-CM | POA: Diagnosis not present

## 2014-12-19 ENCOUNTER — Encounter: Payer: Self-pay | Admitting: Pulmonary Disease

## 2014-12-19 ENCOUNTER — Ambulatory Visit (INDEPENDENT_AMBULATORY_CARE_PROVIDER_SITE_OTHER): Payer: Medicare Other | Admitting: Pulmonary Disease

## 2014-12-19 ENCOUNTER — Ambulatory Visit (INDEPENDENT_AMBULATORY_CARE_PROVIDER_SITE_OTHER)
Admission: RE | Admit: 2014-12-19 | Discharge: 2014-12-19 | Disposition: A | Discharge status: Home / Self Care | Payer: Medicare Other | Source: Ambulatory Visit | Attending: Pulmonary Disease | Admitting: Pulmonary Disease

## 2014-12-19 VITALS — BP 122/64 | HR 92 | Temp 98.7°F | Ht 66.0 in | Wt 131.0 lb

## 2014-12-19 DIAGNOSIS — R0602 Shortness of breath: Secondary | ICD-10-CM | POA: Diagnosis not present

## 2014-12-19 DIAGNOSIS — J449 Chronic obstructive pulmonary disease, unspecified: Secondary | ICD-10-CM | POA: Diagnosis not present

## 2014-12-19 DIAGNOSIS — J432 Centrilobular emphysema: Secondary | ICD-10-CM

## 2014-12-19 MED ORDER — TIOTROPIUM BROMIDE MONOHYDRATE 2.5 MCG/ACT IN AERS: 2.0000 | INHALATION_SPRAY | Freq: Every day | RESPIRATORY_TRACT | Status: DC

## 2014-12-19 MED ORDER — DOXYCYCLINE HYCLATE 100 MG PO TABS: 100.0000 mg | ORAL_TABLET | Freq: Two times a day (BID) | ORAL | Status: DC

## 2014-12-19 MED ORDER — PREDNISONE 10 MG PO TABS: ORAL_TABLET | ORAL | Status: DC

## 2014-12-19 NOTE — Progress Notes (Signed)
Subjective:    Patient ID: Brenda [name], female    DOB: Mar 16, 1951, 64 y.o.   MRN: [id number]  Synopsis: Severe COPD, still actively smoking as of July 2016 Simple spirometry FEV1 June 2016 0.67 L (27% predicted) with clear airflow obstruction  HPI Chief Complaint  Patient presents with  . Follow-up    Pt c/o hoarseness, prod cough with thick white mucus Xfew days.  Pt has not yet started pulm rehab-cannot afford the $40/visit copay.     Brenda [name] has been cougihng more in the last week and has been more short of breath.  Today is worse than in the last week, symptoms have been progressing more in the last week.  No fever, but she feels febrile.  Some chills recently.  Her family has not been sick with the exception of allergies.  She took claritin a week ago which did not help.  She is coughing up thick mucus which was green today.  She said that the Spiriva really helped after the last visit.  She has been taking both the Breo and the Spiriva, but she doesn't have a prescription for both right now.  She is still smoking cigarettes daily.    Past Medical History  Diagnosis Date  . History of DVT (deep vein thrombosis)   . Coronary artery disease     multiple stens  . COPD (chronic obstructive pulmonary disease)   . Transient ischemic attack   . Depression   . Hx of radiation therapy 01/16/09 - 03/06/09    base of tongue, right neck node  . Peripheral arterial disease     status post bilateral femoropopliteal bypass graft performed by Dr. Sherren Mocha early in the past with significant right lower extremity lifestyle limiting claudication  . Pneumonia   . Hypercholesteremia   . Chronic bronchitis   . Shortness of breath     "w/exertion and sometimes when laying down" (11/26/2012)  . Type II diabetes mellitus   . History of blood transfusion 1955  . GERD (gastroesophageal reflux disease)   . H/O hiatal hernia   . Stroke 2000    left side weakness remains (11/26/2012)  . Arthritis    "hands" (11/26/2012)  . 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 Myocardial infarction 2005    dr Ellyn Hack  with [facility]  . Fatigue 08/23/2013  . Carotid artery occlusion       Review of Systems  Constitutional: Negative for fever, chills and fatigue.  HENT: Negative for postnasal drip, rhinorrhea and sinus pressure.   Respiratory: Positive for cough, shortness of breath and wheezing.   Cardiovascular: Negative for chest pain, palpitations and leg swelling.       Objective:   Physical Exam Filed Vitals:   12/19/14 1410  BP: 122/64  Pulse: 92  Temp: 98.7 F (37.1 C)  TempSrc: Oral  Height: '5\' 6"'$  (1.676 m)  Weight: 131 lb (59.421 kg)  SpO2: 96%   RA  Gen: well appearing, no acute distress HENT: NCAT, OP clear, neck supple without masses Eyes: PERRL, EOMi Lymph: no cervical lymphadenopathy PULM: wheezing, some rhonchi CV: RRR, no mgr, no JVD GI: BS+, soft, nontender, no hsm Derm: no rash or skin breakdown MSK: normal bulk and tone Neuro: A&Ox4, CN II-XII intact, strength 5/5 in all 4 extremities Psyche: normal mood and affect        Assessment & Plan:  COPD (chronic obstructive pulmonary disease) She has  severe COPD and unfortunately continues to have recurrent exacerbations. She is having an exacerbation today. I explained to her that this was due to the fact that she continues to smoke cigarettes.  Plan: Chest x-ray to ensure there is nothing else going on besides a flare of COPD Treat COPD exacerbation with prednisone taper and doxycycline, recommended probiotic Advised at length to quit smoking Add Spiriva to [facility], if this regimen is too expensive then we could try Stiolto F/u 6 weeks    Current outpatient prescriptions:  .  acetaminophen (TYLENOL) 500 MG tablet, Take 1,000 mg by mouth every 6 (six) hours as needed for pain., Disp: , Rfl:  .  albuterol (PROVENTIL HFA;VENTOLIN HFA) 108 (90 BASE) MCG/ACT inhaler, Inhale 2  puffs into the lungs every 6 (six) hours as needed for wheezing or shortness of breath., Disp: 1 Inhaler, Rfl: 0 .  aspirin EC 81 MG tablet, Take 81 mg by mouth daily., Disp: , Rfl:  .  buPROPion (WELLBUTRIN XL) 150 MG 24 hr tablet, Take 1 tablet (150 mg total) by mouth daily., Disp: 30 tablet, Rfl: 0 .  clopidogrel (PLAVIX) 75 MG tablet, Take 75 mg by mouth daily. , Disp: , Rfl:  .  dextromethorphan-guaiFENesin (MUCINEX DM) 30-600 MG per 12 hr tablet, Take 1 tablet by mouth 2 (two) times daily., Disp: 14 tablet, Rfl: 0 .  docusate sodium (COLACE) 100 MG capsule, Take 100 mg by mouth daily., Disp: , Rfl:  .  Fiber CAPS, Take 2 capsules by mouth daily. , Disp: , Rfl:  .  fish oil-omega-3 fatty acids 1000 MG capsule, Take 1 g by mouth 2 (two) times daily. , Disp: , Rfl:  .  Fluticasone Furoate-Vilanterol (BREO ELLIPTA IN), Inhale 1 puff into the lungs daily., Disp: , Rfl:  .  insulin glargine (LANTUS) 100 UNIT/ML injection, Inject 0.3 mLs (30 Units total) into the skin daily. Inject 10 units each morning. Inject evening dose as follows: 16 units up to cbg of 250.  20 units if cbg is higher than 250., Disp: 10 mL, Rfl: 11 .  nitroGLYCERIN (NITROLINGUAL) 0.4 MG/SPRAY spray, Place 1 spray under the tongue every 5 (five) minutes as needed for chest pain., Disp: , Rfl:  .  omeprazole (PRILOSEC) 20 MG capsule, Take 20 mg by mouth daily. , Disp: , Rfl:  .  ondansetron (ZOFRAN-ODT) 4 MG disintegrating tablet, Take 4 mg by mouth every 8 (eight) hours as needed for nausea., Disp: , Rfl:  .  rosuvastatin (CRESTOR) 20 MG tablet, Take 20 mg by mouth daily., Disp: , Rfl:  .  doxycycline (VIBRA-TABS) 100 MG tablet, Take 1 tablet (100 mg total) by mouth 2 (two) times daily., Disp: 10 tablet, Rfl: 0 .  predniSONE (DELTASONE) 10 MG tablet, Take '40mg'$  po daily for 3 days, then take '30mg'$  po daily for 3 days, then take '20mg'$  po daily for two days, then take '10mg'$  po daily for 2 days, Disp: 27 tablet, Rfl: 0 .  Tiotropium  Bromide Monohydrate (SPIRIVA RESPIMAT) 2.5 MCG/ACT AERS, Inhale 2 puffs into the lungs daily., Disp: 1 Inhaler, Rfl: 11q

## 2014-12-19 NOTE — Patient Instructions (Signed)
Take Spiriva in addition to the [facility] We will call you with the results of the chest x-ray Take the prednisone taper as prescribed Take the doxycycline twice a day for 5 days, use sunscreen with this medication as it can cause a bad sunburn, also I recommend that you take it with a probiotic such as yogurt Quit smoking We will see you back in 6 weeks or sooner if needed

## 2014-12-19 NOTE — Telephone Encounter (Signed)
lmtcb X3 for pt.  Will close per triage protocol.  

## 2014-12-19 NOTE — Assessment & Plan Note (Signed)
She has severe COPD and unfortunately continues to have recurrent exacerbations. She is having an exacerbation today. I explained to her that this was due to the fact that she continues to smoke cigarettes.  Plan: Chest x-ray to ensure there is nothing else going on besides a flare of COPD Treat COPD exacerbation with prednisone taper and doxycycline, recommended probiotic Advised at length to quit smoking Add Spiriva to [facility], if this regimen is too expensive then we could try Stiolto F/u 6 weeks

## 2014-12-21 ENCOUNTER — Telehealth: Payer: Self-pay | Admitting: Pulmonary Disease

## 2014-12-21 NOTE — Progress Notes (Signed)
Quick Note:  LVM for pt to return call ______ 

## 2014-12-21 NOTE — Telephone Encounter (Signed)
Per cxr result note:     Please let the patient know this was OK   I spoke with patient about results and she verbalized understanding and had no questions

## 2014-12-26 ENCOUNTER — Telehealth (HOSPITAL_COMMUNITY): Payer: Self-pay

## 2014-12-26 NOTE — Telephone Encounter (Signed)
I have called and left a message with Satoya to inquire about participation in Pulmonary Rehab per Dr. Anastasia Pall referral. Will send letter in mail and follow up.

## 2015-01-04 DIAGNOSIS — R05 Cough: Secondary | ICD-10-CM | POA: Diagnosis not present

## 2015-01-04 DIAGNOSIS — R509 Fever, unspecified: Secondary | ICD-10-CM | POA: Diagnosis not present

## 2015-01-04 DIAGNOSIS — N390 Urinary tract infection, site not specified: Secondary | ICD-10-CM | POA: Diagnosis not present

## 2015-01-04 DIAGNOSIS — J449 Chronic obstructive pulmonary disease, unspecified: Secondary | ICD-10-CM | POA: Diagnosis not present

## 2015-01-11 DIAGNOSIS — Z85810 Personal history of malignant neoplasm of tongue: Secondary | ICD-10-CM | POA: Diagnosis not present

## 2015-01-11 DIAGNOSIS — J040 Acute laryngitis: Secondary | ICD-10-CM | POA: Diagnosis not present

## 2015-01-11 DIAGNOSIS — Z720 Tobacco use: Secondary | ICD-10-CM | POA: Diagnosis not present

## 2015-01-17 ENCOUNTER — Encounter (HOSPITAL_COMMUNITY): Payer: Self-pay | Admitting: Cardiology

## 2015-01-17 ENCOUNTER — Emergency Department (HOSPITAL_COMMUNITY): Payer: Medicare Other

## 2015-01-17 ENCOUNTER — Inpatient Hospital Stay (HOSPITAL_COMMUNITY)
Admission: EM | Admit: 2015-01-17 | Discharge: 2015-01-20 | DRG: 871 | Disposition: A | Discharge status: Home / Self Care | Payer: Medicare Other | Attending: Internal Medicine | Admitting: Internal Medicine

## 2015-01-17 DIAGNOSIS — I252 Old myocardial infarction: Secondary | ICD-10-CM

## 2015-01-17 DIAGNOSIS — A419 Sepsis, unspecified organism: Principal | ICD-10-CM | POA: Diagnosis present

## 2015-01-17 DIAGNOSIS — J441 Chronic obstructive pulmonary disease with (acute) exacerbation: Secondary | ICD-10-CM | POA: Diagnosis not present

## 2015-01-17 DIAGNOSIS — Z8673 Personal history of transient ischemic attack (TIA), and cerebral infarction without residual deficits: Secondary | ICD-10-CM | POA: Diagnosis not present

## 2015-01-17 DIAGNOSIS — F329 Major depressive disorder, single episode, unspecified: Secondary | ICD-10-CM | POA: Diagnosis present

## 2015-01-17 DIAGNOSIS — Z86718 Personal history of other venous thrombosis and embolism: Secondary | ICD-10-CM

## 2015-01-17 DIAGNOSIS — Z7982 Long term (current) use of aspirin: Secondary | ICD-10-CM

## 2015-01-17 DIAGNOSIS — I739 Peripheral vascular disease, unspecified: Secondary | ICD-10-CM | POA: Diagnosis present

## 2015-01-17 DIAGNOSIS — E1165 Type 2 diabetes mellitus with hyperglycemia: Secondary | ICD-10-CM | POA: Diagnosis not present

## 2015-01-17 DIAGNOSIS — Z923 Personal history of irradiation: Secondary | ICD-10-CM

## 2015-01-17 DIAGNOSIS — K219 Gastro-esophageal reflux disease without esophagitis: Secondary | ICD-10-CM | POA: Diagnosis not present

## 2015-01-17 DIAGNOSIS — K224 Dyskinesia of esophagus: Secondary | ICD-10-CM | POA: Diagnosis not present

## 2015-01-17 DIAGNOSIS — J189 Pneumonia, unspecified organism: Secondary | ICD-10-CM | POA: Diagnosis present

## 2015-01-17 DIAGNOSIS — M1990 Unspecified osteoarthritis, unspecified site: Secondary | ICD-10-CM | POA: Diagnosis present

## 2015-01-17 DIAGNOSIS — Z85810 Personal history of malignant neoplasm of tongue: Secondary | ICD-10-CM | POA: Diagnosis not present

## 2015-01-17 DIAGNOSIS — R300 Dysuria: Secondary | ICD-10-CM | POA: Diagnosis not present

## 2015-01-17 DIAGNOSIS — E785 Hyperlipidemia, unspecified: Secondary | ICD-10-CM | POA: Diagnosis present

## 2015-01-17 DIAGNOSIS — Z955 Presence of coronary angioplasty implant and graft: Secondary | ICD-10-CM | POA: Diagnosis not present

## 2015-01-17 DIAGNOSIS — R05 Cough: Secondary | ICD-10-CM | POA: Diagnosis not present

## 2015-01-17 DIAGNOSIS — R1310 Dysphagia, unspecified: Secondary | ICD-10-CM | POA: Diagnosis not present

## 2015-01-17 DIAGNOSIS — Z9981 Dependence on supplemental oxygen: Secondary | ICD-10-CM | POA: Diagnosis not present

## 2015-01-17 DIAGNOSIS — I959 Hypotension, unspecified: Secondary | ICD-10-CM | POA: Diagnosis not present

## 2015-01-17 DIAGNOSIS — Z720 Tobacco use: Secondary | ICD-10-CM | POA: Diagnosis not present

## 2015-01-17 DIAGNOSIS — Z7902 Long term (current) use of antithrombotics/antiplatelets: Secondary | ICD-10-CM | POA: Diagnosis not present

## 2015-01-17 DIAGNOSIS — R1314 Dysphagia, pharyngoesophageal phase: Secondary | ICD-10-CM | POA: Diagnosis not present

## 2015-01-17 DIAGNOSIS — I2510 Atherosclerotic heart disease of native coronary artery without angina pectoris: Secondary | ICD-10-CM | POA: Diagnosis present

## 2015-01-17 DIAGNOSIS — R079 Chest pain, unspecified: Secondary | ICD-10-CM | POA: Diagnosis not present

## 2015-01-17 DIAGNOSIS — IMO0002 Reserved for concepts with insufficient information to code with codable children: Secondary | ICD-10-CM | POA: Diagnosis present

## 2015-01-17 DIAGNOSIS — R0602 Shortness of breath: Secondary | ICD-10-CM | POA: Diagnosis not present

## 2015-01-17 DIAGNOSIS — E780 Pure hypercholesterolemia: Secondary | ICD-10-CM | POA: Diagnosis not present

## 2015-01-17 DIAGNOSIS — Z8589 Personal history of malignant neoplasm of other organs and systems: Secondary | ICD-10-CM | POA: Diagnosis not present

## 2015-01-17 DIAGNOSIS — F17210 Nicotine dependence, cigarettes, uncomplicated: Secondary | ICD-10-CM | POA: Diagnosis present

## 2015-01-17 DIAGNOSIS — R1319 Other dysphagia: Secondary | ICD-10-CM | POA: Diagnosis not present

## 2015-01-17 DIAGNOSIS — J449 Chronic obstructive pulmonary disease, unspecified: Secondary | ICD-10-CM | POA: Diagnosis not present

## 2015-01-17 DIAGNOSIS — Z794 Long term (current) use of insulin: Secondary | ICD-10-CM

## 2015-01-17 DIAGNOSIS — J440 Chronic obstructive pulmonary disease with acute lower respiratory infection: Secondary | ICD-10-CM | POA: Diagnosis not present

## 2015-01-17 DIAGNOSIS — R069 Unspecified abnormalities of breathing: Secondary | ICD-10-CM | POA: Diagnosis not present

## 2015-01-17 LAB — BASIC METABOLIC PANEL
Anion gap: 12 (ref 5–15)
BUN: 10 mg/dL (ref 6–20)
CO2: 25 mmol/L (ref 22–32)
Calcium: 8.7 mg/dL — ABNORMAL LOW (ref 8.9–10.3)
Chloride: 91 mmol/L — ABNORMAL LOW (ref 101–111)
Creatinine, Ser: 0.88 mg/dL (ref 0.44–1.00)
GFR calc Af Amer: >60 mL/min (ref >60)
GFR calc non Af Amer: >60 mL/min (ref >60)
Glucose, Bld: 346 mg/dL — ABNORMAL HIGH (ref 65–99)
Potassium: 4.5 mmol/L (ref 3.5–5.1)
Sodium: 128 mmol/L — ABNORMAL LOW (ref 135–145)

## 2015-01-17 LAB — URINALYSIS, ROUTINE W REFLEX MICROSCOPIC
Bilirubin Urine: NEGATIVE
Glucose, UA: >1000 mg/dL — AB
Hgb urine dipstick: NEGATIVE
Ketones, ur: 15 mg/dL — AB
Leukocytes, UA: NEGATIVE
Nitrite: NEGATIVE
Protein, ur: NEGATIVE mg/dL
Specific Gravity, Urine: 1.016 (ref 1.005–1.030)
Urobilinogen, UA: 1 mg/dL (ref 0.0–1.0)
pH: 5.5 (ref 5.0–8.0)

## 2015-01-17 LAB — CBC WITH DIFFERENTIAL/PLATELET
Basophils Absolute: 0 10*3/uL (ref 0.0–0.1)
Basophils Relative: 0 % (ref 0–1)
Eosinophils Absolute: 0 10*3/uL (ref 0.0–0.7)
Eosinophils Relative: 0 % (ref 0–5)
HCT: 41.1 % (ref 36.0–46.0)
Hemoglobin: 14.2 g/dL (ref 12.0–15.0)
Lymphocytes Relative: 4 % — ABNORMAL LOW (ref 12–46)
Lymphs Abs: 0.4 10*3/uL — ABNORMAL LOW (ref 0.7–4.0)
MCH: 31.3 pg (ref 26.0–34.0)
MCHC: 34.5 g/dL (ref 30.0–36.0)
MCV: 90.5 fL (ref 78.0–100.0)
Monocytes Absolute: 0.5 10*3/uL (ref 0.1–1.0)
Monocytes Relative: 4 % (ref 3–12)
Neutro Abs: 11.5 10*3/uL — ABNORMAL HIGH (ref 1.7–7.7)
Neutrophils Relative %: 92 % — ABNORMAL HIGH (ref 43–77)
Platelets: 246 10*3/uL (ref 150–400)
RBC: 4.54 MIL/uL (ref 3.87–5.11)
RDW: 13.6 % (ref 11.5–15.5)
WBC: 12.4 10*3/uL — ABNORMAL HIGH (ref 4.0–10.5)

## 2015-01-17 LAB — URINE MICROSCOPIC-ADD ON

## 2015-01-17 LAB — MRSA PCR SCREENING: MRSA by PCR: POSITIVE — AB

## 2015-01-17 LAB — I-STAT TROPONIN, ED: Troponin i, poc: 0 ng/mL (ref 0.00–0.08)

## 2015-01-17 LAB — I-STAT CG4 LACTIC ACID, ED: Lactic Acid, Venous: 1.49 mmol/L (ref 0.5–2.0)

## 2015-01-17 LAB — GLUCOSE, CAPILLARY: Glucose-Capillary: 437 mg/dL — ABNORMAL HIGH (ref 65–99)

## 2015-01-17 MED ORDER — ALBUTEROL SULFATE (2.5 MG/3ML) 0.083% IN NEBU
2.5000 mg | INHALATION_SOLUTION | Freq: Four times a day (QID) | RESPIRATORY_TRACT | Status: DC
  Administered 2015-01-17 – 2015-01-18 (×2): 2.5 mg via RESPIRATORY_TRACT
  Filled 2015-01-17: qty 3

## 2015-01-17 MED ORDER — SODIUM CHLORIDE 0.9 % IV BOLUS (SEPSIS)
1000.0000 mL | INTRAVENOUS | Status: AC
  Administered 2015-01-17 (×2): 1000 mL via INTRAVENOUS

## 2015-01-17 MED ORDER — CLOPIDOGREL BISULFATE 75 MG PO TABS
75.0000 mg | ORAL_TABLET | Freq: Every day | ORAL | Status: DC
  Administered 2015-01-18 – 2015-01-20 (×3): 75 mg via ORAL
  Filled 2015-01-17 (×3): qty 1

## 2015-01-17 MED ORDER — DEXTROSE 5 % IV SOLN
500.0000 mg | Freq: Once | INTRAVENOUS | Status: AC
  Administered 2015-01-17: 500 mg via INTRAVENOUS
  Filled 2015-01-17: qty 500

## 2015-01-17 MED ORDER — ENOXAPARIN SODIUM 40 MG/0.4ML ~~LOC~~ SOLN
40.0000 mg | SUBCUTANEOUS | Status: DC
  Administered 2015-01-17 – 2015-01-19 (×3): 40 mg via SUBCUTANEOUS
  Filled 2015-01-17 (×4): qty 0.4

## 2015-01-17 MED ORDER — PREDNISONE 20 MG PO TABS: 20.0000 mg | ORAL_TABLET | Freq: Every day | ORAL | Status: DC

## 2015-01-17 MED ORDER — MUPIROCIN 2 % EX OINT
1.0000 "application " | TOPICAL_OINTMENT | Freq: Two times a day (BID) | CUTANEOUS | Status: DC
  Administered 2015-01-18 – 2015-01-20 (×6): 1 via NASAL
  Filled 2015-01-17 (×3): qty 22

## 2015-01-17 MED ORDER — PREDNISONE 20 MG PO TABS
30.0000 mg | ORAL_TABLET | Freq: Every day | ORAL | Status: AC
  Administered 2015-01-20: 30 mg via ORAL
  Filled 2015-01-17 (×3): qty 1

## 2015-01-17 MED ORDER — ONDANSETRON HCL 4 MG PO TABS: 4.0000 mg | ORAL_TABLET | Freq: Four times a day (QID) | ORAL | Status: DC | PRN

## 2015-01-17 MED ORDER — OMEGA-3-ACID ETHYL ESTERS 1 G PO CAPS
1.0000 g | ORAL_CAPSULE | Freq: Two times a day (BID) | ORAL | Status: DC
Start: 2015-01-17 — End: 2015-01-20
  Administered 2015-01-17 – 2015-01-20 (×6): 1 g via ORAL
  Filled 2015-01-17 (×7): qty 1

## 2015-01-17 MED ORDER — ALBUTEROL SULFATE (2.5 MG/3ML) 0.083% IN NEBU: 2.5000 mg | INHALATION_SOLUTION | RESPIRATORY_TRACT | Status: DC | PRN

## 2015-01-17 MED ORDER — DEXTROSE 5 % IV SOLN
1.0000 g | INTRAVENOUS | Status: DC
  Administered 2015-01-18 – 2015-01-19 (×2): 1 g via INTRAVENOUS
  Filled 2015-01-17 (×3): qty 10

## 2015-01-17 MED ORDER — PREDNISONE 20 MG PO TABS
40.0000 mg | ORAL_TABLET | Freq: Every day | ORAL | Status: AC
  Administered 2015-01-19: 40 mg via ORAL
  Filled 2015-01-17: qty 2

## 2015-01-17 MED ORDER — PREDNISONE 50 MG PO TABS
50.0000 mg | ORAL_TABLET | Freq: Every day | ORAL | Status: AC
  Administered 2015-01-18: 50 mg via ORAL
  Filled 2015-01-17: qty 1

## 2015-01-17 MED ORDER — ACETAMINOPHEN 325 MG PO TABS: 650.0000 mg | ORAL_TABLET | Freq: Four times a day (QID) | ORAL | Status: DC | PRN

## 2015-01-17 MED ORDER — DEXTROSE 5 % IV SOLN
1.0000 g | Freq: Once | INTRAVENOUS | Status: AC
  Administered 2015-01-17: 1 g via INTRAVENOUS
  Filled 2015-01-17: qty 10

## 2015-01-17 MED ORDER — INSULIN ASPART 100 UNIT/ML ~~LOC~~ SOLN
0.0000 [IU] | Freq: Three times a day (TID) | SUBCUTANEOUS | Status: DC
  Administered 2015-01-18 (×3): 5 [IU] via SUBCUTANEOUS
  Administered 2015-01-19: 3 [IU] via SUBCUTANEOUS
  Administered 2015-01-19: 5 [IU] via SUBCUTANEOUS
  Administered 2015-01-20: 2 [IU] via SUBCUTANEOUS

## 2015-01-17 MED ORDER — INSULIN GLARGINE 100 UNIT/ML ~~LOC~~ SOLN
30.0000 [IU] | Freq: Every day | SUBCUTANEOUS | Status: DC
  Administered 2015-01-18 – 2015-01-19 (×3): 30 [IU] via SUBCUTANEOUS
  Filled 2015-01-17 (×5): qty 0.3

## 2015-01-17 MED ORDER — PREDNISONE 10 MG PO TABS: 10.0000 mg | ORAL_TABLET | Freq: Every day | ORAL | Status: DC

## 2015-01-17 MED ORDER — SODIUM CHLORIDE 0.9 % IV SOLN
INTRAVENOUS | Status: DC
  Administered 2015-01-17 – 2015-01-19 (×4): via INTRAVENOUS

## 2015-01-17 MED ORDER — BUPROPION HCL ER (XL) 150 MG PO TB24
150.0000 mg | ORAL_TABLET | Freq: Every day | ORAL | Status: DC
  Administered 2015-01-18 – 2015-01-20 (×3): 150 mg via ORAL
  Filled 2015-01-17 (×3): qty 1

## 2015-01-17 MED ORDER — CHLORHEXIDINE GLUCONATE CLOTH 2 % EX PADS
6.0000 | MEDICATED_PAD | Freq: Every day | CUTANEOUS | Status: DC
  Administered 2015-01-18 – 2015-01-20 (×3): 6 via TOPICAL

## 2015-01-17 MED ORDER — IPRATROPIUM BROMIDE 0.02 % IN SOLN
0.5000 mg | Freq: Four times a day (QID) | RESPIRATORY_TRACT | Status: DC
Start: 2015-01-17 — End: 2015-01-18
  Administered 2015-01-17 – 2015-01-18 (×2): 0.5 mg via RESPIRATORY_TRACT
  Filled 2015-01-17 (×2): qty 2.5

## 2015-01-17 MED ORDER — AZITHROMYCIN 500 MG IV SOLR
500.0000 mg | INTRAVENOUS | Status: DC
  Administered 2015-01-18 – 2015-01-19 (×2): 500 mg via INTRAVENOUS
  Filled 2015-01-17 (×2): qty 500

## 2015-01-17 MED ORDER — PANTOPRAZOLE SODIUM 40 MG PO TBEC
40.0000 mg | DELAYED_RELEASE_TABLET | Freq: Every day | ORAL | Status: DC
  Administered 2015-01-18 – 2015-01-20 (×3): 40 mg via ORAL
  Filled 2015-01-17 (×3): qty 1

## 2015-01-17 MED ORDER — ROSUVASTATIN CALCIUM 20 MG PO TABS
20.0000 mg | ORAL_TABLET | Freq: Every day | ORAL | Status: DC
  Administered 2015-01-18 – 2015-01-20 (×3): 20 mg via ORAL
  Filled 2015-01-17 (×3): qty 1

## 2015-01-17 MED ORDER — ONDANSETRON HCL 4 MG/2ML IJ SOLN: 4.0000 mg | Freq: Four times a day (QID) | INTRAMUSCULAR | Status: DC | PRN

## 2015-01-17 MED ORDER — ACETAMINOPHEN 650 MG RE SUPP: 650.0000 mg | Freq: Four times a day (QID) | RECTAL | Status: DC | PRN

## 2015-01-17 MED ORDER — ASPIRIN EC 81 MG PO TBEC
81.0000 mg | DELAYED_RELEASE_TABLET | Freq: Every day | ORAL | Status: DC
  Administered 2015-01-18 – 2015-01-20 (×3): 81 mg via ORAL
  Filled 2015-01-17 (×3): qty 1

## 2015-01-17 MED ORDER — SODIUM CHLORIDE 0.9 % IV BOLUS (SEPSIS)
1000.0000 mL | Freq: Once | INTRAVENOUS | Status: AC
  Administered 2015-01-17: 1000 mL via INTRAVENOUS

## 2015-01-17 NOTE — ED Provider Notes (Signed)
CSN: [id number]     Arrival date & time 01/17/15  1345 History   First MD Initiated Contact with Patient 01/17/15 1347     Chief Complaint  Patient presents with  . Shortness of Breath   HPI   64 year old female presents today with shortness of breath. Patient reports that this morning she woke up feeling at her baseline reports that she got into an argument with her granddaughter, and reports that during the argument she became short of breath, began to wheeze. She reports sitting down as this is happened numerous times before, usually resolved with deep inspirations and breathing exercises. She reports symptoms continued to worsen, she used her inhaler and a puff of her cigarette which did not improve symptoms. Her symptoms did not improve she called 911. Patient has a history of COPD. EMS reports in route patient was wheezing diffusely, 2 treatments of DuoNeb and Solu-Medrol 125. They report dramatic improvements in her comfort level and breath sounds. At the time of arrival patient reports she still has some shortness of breath, she notes some anterior chest pain earlier on during the attack, but denies any at this point. Patient denies any recent fever, chills, cough, exposure to specific allergens, lower extremity swelling or edema, recent hospital admission, recent surgery, estrogen use, history DVT or PE.  Patient notes a recent history of urinary tract infection for which she is still taking Keflex. She goes on to note a several month decline in her general overall wellness, reports extreme fatigue stating that in the morning she can get her daily chores done but by the afternoon she is too fatigued to continue and is down to the couch. She denies specific shortness of breath with activities just overall fatigue. Patient no she has difficulty with sleeping at night on her back, but reports she can lay on her left or right side without discomfort, she reports this is long-standing and due to back  problems.  Family notes she has a history of diabetes, notes that her blood sugar has been elevated recently, she was unable to give me exact reading numbers and last time she checked it. Patient reports she is taking her daily medications.     Past Medical History  Diagnosis Date  . History of DVT (deep vein thrombosis)   . Coronary artery disease     multiple stens  . COPD (chronic obstructive pulmonary disease)   . Transient ischemic attack   . Depression   . Hx of radiation therapy 01/16/09 - 03/06/09    base of tongue, right neck node  . Peripheral arterial disease     status post bilateral femoropopliteal bypass graft performed by Dr. Sherren Mocha early in the past with significant right lower extremity lifestyle limiting claudication  . Pneumonia   . Hypercholesteremia   . Chronic bronchitis   . Shortness of breath     "w/exertion and sometimes when laying down" (11/26/2012)  . Type II diabetes mellitus   . History of blood transfusion 1955  . GERD (gastroesophageal reflux disease)   . H/O hiatal hernia   . Stroke 2000    left side weakness remains (11/26/2012)  . Arthritis     "hands" (11/26/2012)  . 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 Myocardial infarction 2005    dr Ellyn Hack  with [facility]  . Fatigue 08/23/2013  . Carotid artery occlusion    Past Surgical History  Procedure Laterality Date  .  Tee without cardioversion  07/26/2011    Procedure: TRANSESOPHAGEAL ECHOCARDIOGRAM (TEE);  Surgeon: Pixie Casino, MD;  Location: [facility] ENDOSCOPY;  Service: Cardiovascular;  Laterality: N/A;  . Esophagogastroduodenoscopy  12/20/2011    Procedure: ESOPHAGOGASTRODUODENOSCOPY (EGD);  Surgeon: Beryle Beams, MD;  Location: [facility] ENDOSCOPY;  Service: Endoscopy;  Laterality: N/A;  EGD with balloon dilation  . Savory dilation  12/20/2011    Procedure: SAVORY DILATION;  Surgeon: Beryle Beams, MD;  Location: [facility] ENDOSCOPY;  Service: Endoscopy;  Laterality: N/A;   . Tonsillectomy  1969  . Appendectomy  1962  . Cardiac catheterization  1999; 11/26/2012  . Coronary angioplasty with stent placement  1999--2005    "i think I have 3-5 total" (11/26/2012)  . Femoral bypass  1999; 2000    Archie Endo 11/26/2012  . Carpal tunnel release Right 1980's?  . Vaginal hysterectomy  1988  . Cesarean section  1978  . Tubal ligation  1988  . Cataract extraction w/ intraocular lens implant Right 2011  . Ankle fracture surgery Right 2007    "got a metal plate and 8 screws in it" (11/26/2012)  . Bypass graft popliteal to popliteal Right 02/05/2013    Procedure: BYPASS GRAFT ABOVE KNEE POPLITEAL TO BELOW KNEE POPLITEAL WITH SMALL SAPHANOUS VEIN;  Surgeon: Rosetta Posner, MD;  Location: [facility] OR;  Service: Vascular;  Laterality: Right;  . Eye surgery     Family History  Problem Relation Age of Onset  . Cancer Mother     pancreatic  . Diabetes Mother   . Hyperlipidemia Mother   . Hypertension Mother   . Other Mother     varicose vein  . Cancer Father 72    throat  . Heart disease Father   . Heart attack Father   . Cancer Sister     breast  . Diabetes Sister   . Deep vein thrombosis Brother   . Diabetes Brother   . Hearing loss Brother   . Hyperlipidemia Brother   . Hypertension Brother   . Heart attack Brother   . Clotting disorder Brother   . Diabetes Son   . Hyperlipidemia Son    Social History  Substance Use Topics  . Smoking status: Current Every Day Smoker -- 1.00 packs/day for 50 years    Types: Cigarettes  . Smokeless tobacco: Never Used  . Alcohol Use: 6.0 oz/week    10 Glasses of wine per week   OB History    No data available     Review of Systems  All other systems reviewed and are negative.   Allergies  Tape and Nicoderm  Home Medications   Prior to Admission medications   Medication Sig Start Date End Date Taking? Authorizing Provider  acetaminophen (TYLENOL) 500 MG tablet Take 1,000 mg by mouth every 6 (six) hours as needed for pain.    Yes Historical Provider, MD  aspirin EC 81 MG tablet Take 81 mg by mouth daily.   Yes Historical Provider, MD  buPROPion (WELLBUTRIN XL) 150 MG 24 hr tablet Take 1 tablet (150 mg total) by mouth daily. 08/18/14  Yes Allie Bossier, MD  cephALEXin (KEFLEX) 500 MG capsule Take 500 mg by mouth 4 (four) times daily.   Yes Historical Provider, MD  clopidogrel (PLAVIX) 75 MG tablet Take 75 mg by mouth daily.    Yes Historical Provider, MD  docusate sodium (COLACE) 100 MG capsule Take 100 mg by mouth daily.   Yes Historical Provider, MD  fish oil-omega-3  fatty acids 1000 MG capsule Take 1 g by mouth 2 (two) times daily.    Yes Historical Provider, MD  Fluticasone Furoate-Vilanterol (BREO ELLIPTA IN) Inhale 1 puff into the lungs daily.   Yes Historical Provider, MD  insulin glargine (LANTUS) 100 UNIT/ML injection Inject 0.3 mLs (30 Units total) into the skin daily. Inject 10 units each morning. Inject evening dose as follows: 16 units up to cbg of 250.  20 units if cbg is higher than 250. Patient taking differently: Inject 16-20 Units into the skin daily. Per sliding scale: CBG 250= 16 units CBG>250=20 units 08/18/14  Yes Allie Bossier, MD  Multiple Vitamins-Minerals (MULTIVITAMIN & MINERAL PO) Take 1 tablet by mouth daily.   Yes Historical Provider, MD  omeprazole (PRILOSEC) 20 MG capsule Take 20 mg by mouth daily.    Yes Historical Provider, MD  rosuvastatin (CRESTOR) 20 MG tablet Take 20 mg by mouth daily.   Yes Historical Provider, MD  Tiotropium Bromide Monohydrate (SPIRIVA RESPIMAT) 2.5 MCG/ACT AERS Inhale 2 puffs into the lungs daily. 12/19/14  Yes Juanito Doom, MD  albuterol (PROVENTIL HFA;VENTOLIN HFA) 108 (90 BASE) MCG/ACT inhaler Inhale 2 puffs into the lungs every 6 (six) hours as needed for wheezing or shortness of breath. Patient not taking: Reported on 01/17/2015 08/18/14   Allie Bossier, MD  nitroGLYCERIN (NITROLINGUAL) 0.4 MG/SPRAY spray Place 1 spray under the tongue every 5 (five)  minutes as needed for chest pain.    Historical Provider, MD   BP 85/64 mmHg  Pulse 106  Temp(Src) 97.9 F (36.6 C) (Oral)  Resp 20  Wt 131 lb (59.421 kg)  SpO2 98%   Physical Exam  Constitutional: She is oriented to person, place, and time. She appears well-developed and well-nourished.  HENT:  Head: Normocephalic and atraumatic.  Eyes: Conjunctivae are normal. Pupils are equal, round, and reactive to light. Right eye exhibits no discharge. Left eye exhibits no discharge. No scleral icterus.  Neck: Normal range of motion. No JVD present. No tracheal deviation present.  Cardiovascular: Regular rhythm, normal heart sounds and intact distal pulses.  Exam reveals no gallop and no friction rub.   No murmur heard. Pulmonary/Chest: Effort normal. No stridor. She has no rales. She exhibits no tenderness.  Decrease lower lobe breath sounds, no active wheezing  Musculoskeletal: Normal range of motion.  Neurological: She is alert and oriented to person, place, and time. Coordination normal.  Psychiatric: She has a normal mood and affect. Her behavior is normal. Judgment and thought content normal.  Nursing note and vitals reviewed.   ED Course  Procedures (including critical care time) Labs Review Labs Reviewed  CBC WITH DIFFERENTIAL/PLATELET  BASIC METABOLIC PANEL  URINALYSIS, ROUTINE W REFLEX MICROSCOPIC (NOT AT [facility])    Imaging Review No results found. I have personally reviewed and evaluated these images and lab results as part of my medical decision-making.   EKG Interpretation None      MDM   Final diagnoses:  SOB (shortness of breath)    Labs:  Imaging:  Consults: Hospital consult  Therapeutics:  Discharge Meds:   Assessment/Plan: Patient presents with a asthma attack, she was greatly improved at the time my evaluation. Chest x-ray show left lower lobe pneumonia. Patient's blood pressure has been soft here in the ED, she would be given fluids, IV antibiotics,  hospital consult for hospital admission.          Okey Regal, PA-C 01/18/15 [place], DO 01/18/15 2202

## 2015-01-17 NOTE — Progress Notes (Signed)
Patient admitted from ED to [facility]. Patient up with one assist to bed. O2'@2L'$ . Vital signs WNL. Patient denies any pain or discomfort at this time.

## 2015-01-17 NOTE — ED Notes (Signed)
Pt to department via EMS- pt reports she was caring for granddaughter and became SOB. Pt reports hx of COPD, given 2 duo-neb  '125mg'$  solu-medrol. Also reports some chest pain. Bp-110/82 HR-100 Arrived on neb treatment.

## 2015-01-17 NOTE — ED Provider Notes (Signed)
Medical screening examination/treatment/procedure(s) were conducted as a shared visit with non-physician practitioner(s) and myself.  I personally evaluated the patient during the encounter.   EKG Interpretation None       See the written copy of this report in the patient's paper medical record.  These results did not interface directly into the electronic medical record and are summarized here.  64 year old female with a chief complaint of cough shortness of breath fevers. This been going on for the past few days. Patient found to have left lower lobe pneumonia. Leukocytosis on CBC.  On exam patient with coarse breath sounds with prolonged expiration. Abdomen soft nontender nondistended. Heart rate and rhythm regular. No noted peripheral edema.  Patient on arrival with soft blood pressures. Concern for possible sepsis. Will treat with 30 mL/kg of fluids. Give CAP antibiotics. Admit to the hospital.  Deno Etienne, DO 01/17/15 1658

## 2015-01-17 NOTE — H&P (Signed)
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 Today patient came to the hospital with chief complaint of shortness of breath. Patient says that she got emotionally upset at home, which caused her to have shortness of breath. She tried inhaler which did not help she also put her oxygen on which also did not help with the breathing. So patient died 65. EMS arrived and gave patient breathing treatments, which made patient feel better. Patient was recently prescribed Keflex 500 mg 4 times a day by her primary care provider, 10 days ago for possible pneumonia versus UTI. At that time chest x-ray and UA were obtained by her PCP, patient not aware of the results. Patient says that she has been feeling generally weak, also had fever with chills on Friday. She did have intermittent dysuria but denies difficulty urination at this time. Patient has a history of COPD and was prescribed 24-hour oxygen which she has weaned off to only when necessary basis. In the ED patient was found to be hypotensive, chest x-ray showed acute pneumonia involving the left lower lobe superimposed upon baseline COPD/emphysema.   Allergies:   Allergies  Allergen Reactions  . Tape Hives and Other (See Comments)    USE PAPER TAPE ONLY- Adhesive peels off skin-makes pt. Raw.  Maryjo Rochester  [Nicotine] Rash    To the PATCH only, breakouts on skin      Past Medical History  Diagnosis Date  . History of DVT (deep vein thrombosis)   . Coronary artery disease     multiple stens  . COPD (chronic obstructive pulmonary disease)   . Transient ischemic attack   . Depression   . Hx of radiation therapy 01/16/09 - 03/06/09    base of tongue, right neck node  . Peripheral arterial disease     status post bilateral femoropopliteal bypass graft performed by Dr. Sherren Mocha early in the past with significant right lower extremity lifestyle limiting claudication  . Pneumonia   . Hypercholesteremia   . Chronic bronchitis   . Shortness of breath     "w/exertion and sometimes when laying down" (11/26/2012)  . Type II diabetes mellitus   . History of blood transfusion 1955  . GERD (gastroesophageal reflux disease)   . H/O hiatal hernia   . Stroke 2000    left side weakness remains (11/26/2012)  . Arthritis     "hands" (11/26/2012)  . 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 Myocardial infarction 2005    dr Ellyn Hack  with [facility]  . Fatigue 08/23/2013  . Carotid artery occlusion     Past Surgical History  Procedure Laterality Date  . Tee without cardioversion  07/26/2011    Procedure: TRANSESOPHAGEAL ECHOCARDIOGRAM (TEE);  Surgeon: Pixie Casino, MD;  Location: [facility] ENDOSCOPY;  Service: Cardiovascular;  Laterality: N/A;  . Esophagogastroduodenoscopy  12/20/2011    Procedure: ESOPHAGOGASTRODUODENOSCOPY (EGD);  Surgeon: Beryle Beams, MD;  Location: [facility] ENDOSCOPY;  Service: Endoscopy;  Laterality: N/A;  EGD with balloon dilation  . Savory dilation  12/20/2011    Procedure: SAVORY DILATION;  Surgeon: Beryle Beams, MD;  Location: [facility] ENDOSCOPY;  Service: Endoscopy;  Laterality: N/A;  . Tonsillectomy  1969  . Appendectomy  1962  . Cardiac catheterization  1999; 11/26/2012  . Coronary angioplasty with stent placement  1999--2005    "i think I have 3-5 total"  (11/26/2012)  . Femoral bypass  1999; 2000    Archie Endo 11/26/2012  . Carpal tunnel release Right 1980's?  . Vaginal hysterectomy  1988  . Cesarean section  1978  . Tubal ligation  1988  . Cataract extraction w/ intraocular lens implant Right 2011  . Ankle fracture surgery Right 2007    "got a metal plate and 8 screws in it" (11/26/2012)  . Bypass graft popliteal to popliteal Right 02/05/2013    Procedure: BYPASS GRAFT ABOVE KNEE POPLITEAL TO BELOW KNEE POPLITEAL WITH SMALL SAPHANOUS VEIN;  Surgeon: Rosetta Posner, MD;  Location: [place];  Service: Vascular;  Laterality: Right;  . Eye surgery      Prior to Admission medications   Medication Sig Start Date End Date Taking? Authorizing Provider  acetaminophen (TYLENOL) 500 MG tablet Take 1,000 mg by mouth every 6 (six) hours as needed for pain.   Yes Historical Provider, MD  aspirin EC 81 MG tablet Take 81 mg by mouth daily.   Yes Historical Provider, MD  buPROPion (WELLBUTRIN XL) 150 MG 24 hr tablet Take 1 tablet (150 mg total) by mouth daily. 08/18/14  Yes Allie Bossier, MD  cephALEXin (KEFLEX) 500 MG capsule Take 500 mg by mouth 4 (four) times daily.   Yes Historical Provider, MD  clopidogrel (PLAVIX) 75 MG tablet Take 75 mg by mouth daily.    Yes Historical Provider, MD  docusate sodium (COLACE) 100 MG capsule Take 100 mg by mouth daily.   Yes Historical Provider, MD  fish oil-omega-3 fatty acids 1000 MG capsule Take 1 g by mouth 2 (two) times daily.    Yes Historical Provider, MD  Fluticasone Furoate-Vilanterol (BREO ELLIPTA IN) Inhale 1 puff into the lungs daily.   Yes Historical Provider, MD  insulin glargine (LANTUS) 100 UNIT/ML injection Inject 0.3 mLs (30 Units total) into the skin daily. Inject 10 units each morning. Inject evening dose as follows: 16 units up to cbg of 250.  20 units if cbg is higher than 250. Patient taking differently: Inject 16-20 Units into the skin daily. Per sliding scale: CBG 250= 16 units CBG>250=20 units 08/18/14   Yes Allie Bossier, MD  Multiple Vitamins-Minerals (MULTIVITAMIN & MINERAL PO) Take 1 tablet by mouth daily.   Yes Historical Provider, MD  omeprazole (PRILOSEC) 20 MG capsule Take 20 mg by mouth daily.    Yes Historical Provider, MD  rosuvastatin (CRESTOR) 20 MG tablet Take 20 mg by mouth daily.   Yes Historical Provider, MD  Tiotropium Bromide Monohydrate (SPIRIVA RESPIMAT) 2.5 MCG/ACT AERS Inhale 2 puffs into the lungs daily. 12/19/14  Yes Juanito Doom, MD  albuterol (PROVENTIL HFA;VENTOLIN HFA) 108 (90 BASE) MCG/ACT inhaler Inhale 2 puffs into the lungs every 6 (six) hours as needed for wheezing or shortness of breath. Patient not taking: Reported on 01/17/2015 08/18/14   Allie Bossier, MD  nitroGLYCERIN (NITROLINGUAL) 0.4 MG/SPRAY spray Place 1 spray under the tongue every 5 (five) minutes as needed for chest pain.    Historical Provider, MD  Social History:  reports that she has been smoking Cigarettes.  She has a 50 pack-year smoking history. She has never used smokeless tobacco. She reports that she drinks about 6.0 oz of alcohol per week. She reports that she does not use illicit drugs.  Family History  Problem Relation Age of Onset  . Cancer Mother     pancreatic  . Diabetes Mother   . Hyperlipidemia Mother   . Hypertension Mother   . Other Mother     varicose vein  . Cancer Father 72    throat  . Heart disease Father   . Heart attack Father   . Cancer Sister     breast  . Diabetes Sister   . Deep vein thrombosis Brother   . Diabetes Brother   . Hearing loss Brother   . Hyperlipidemia Brother   . Hypertension Brother   . Heart attack Brother   . Clotting disorder Brother   . Diabetes Son   . Hyperlipidemia Son     Danley Danker Weights   01/17/15 1359  Weight: 59.421 kg (131 lb)    All the positives are listed in BOLD  Review of Systems:  HEENT: Headache, blurred vision, runny nose, sore throat Neck: Hypothyroidism, hyperthyroidism,,lymphadenopathy Chest :  Shortness of breath, history of COPD, Asthma Heart : Chest pain, history of coronary arterey disease GI:  Nausea, vomiting, diarrhea, constipation, GERD GU: Dysuria, urgency, frequency of urination, hematuria Neuro: Stroke, seizures, syncope Psych: Depression, anxiety, hallucinations   Physical Exam: Blood pressure 104/55, pulse 98, temperature 97.9 F (36.6 C), temperature source Oral, resp. rate 16, weight 59.421 kg (131 lb), SpO2 97 %. Constitutional:   Patient is a well-developed and well-nourished female* in no acute distress and cooperative with exam. Head: Normocephalic and atraumatic Mouth: Mucus membranes moist Eyes: PERRL, EOMI, conjunctivae normal Neck: Supple, No Thyromegaly Cardiovascular: RRR, S1 normal, S2 normal Pulmonary/Chest: Decreased breath sounds bilaterally Abdominal: Soft. Non-tender, non-distended, bowel sounds are normal, no masses, organomegaly, or guarding present.  Neurological: A&O x3, Strength is normal and symmetric bilaterally, cranial nerve II-XII are grossly intact, no focal motor deficit, sensory intact to light touch bilaterally.  Extremities : No Cyanosis, Clubbing or Edema  Labs on Admission:  Basic Metabolic Panel:  Recent Labs Lab 01/17/15 1536  NA 128*  K 4.5  CL 91*  CO2 25  GLUCOSE 346*  BUN 10  CREATININE 0.88  CALCIUM 8.7*   Liver Function Tests: No results for input(s): AST, ALT, ALKPHOS, BILITOT, PROT, ALBUMIN in the last 168 hours. No results for input(s): LIPASE, AMYLASE in the last 168 hours. No results for input(s): AMMONIA in the last 168 hours. CBC:  Recent Labs Lab 01/17/15 1536  WBC 12.4*  NEUTROABS 11.5*  HGB 14.2  HCT 41.1  MCV 90.5  PLT 246    Radiological Exams on Admission: Dg Chest 2 View  01/17/2015   CLINICAL DATA:  Oxygen dependent COPD patient presenting with worsening shortness of breath and chest pain and discomfort that began earlier today. Prior coronary artery stenting.  EXAM: CHEST  2 VIEW   COMPARISON:  01/04/2015 and earlier.  FINDINGS: Cardiac silhouette normal in size, unchanged. Thoracic aorta atherosclerotic, unchanged. Hilar and mediastinal contours otherwise unremarkable. Emphysematous changes in the upper lobes. Prominent bronchovascular markings diffusely and moderate central peribronchial thickening, unchanged. Patchy airspace opacities in the left lower lobe, new since the most recent prior examination. Lungs otherwise clear. No pleural effusions. No pneumothorax. Degenerative changes involving the thoracic spine. Old healed  right clavicle fracture.  IMPRESSION: Acute pneumonia involving the left lower lobe superimposed upon baseline COPD/emphysema.   Electronically Signed   By: Evangeline Dakin M.D.   On: 01/17/2015 15:12    EKG: Independently reviewed. Sinus tachycardia   Assessment/Plan Active Problems:   Pneumonia   COPD exacerbation   Diabetes mellitus  Community acquired pneumonia- will admit the patient to stepdown as she was hypotensive, blood pressure has improved after IV fluid bolus. Lactic acid 1.49. Will obtain urinary strep antigen  as well as Legionella antigen. Patient was given Rocephin and Zithromax in the ED. Will continue with the same. Follow the blood cultures.  COPD exacerbation- patient has history of COPD, has decreased breath sounds bilaterally. Will start DuoNeb nebulizers every 6 hours, prednisone taper, [order have been written].  Diabetes mellitus- patient takes Lantus at home, continue Lantus 30 units subcutaneous daily, sliding scale insulin with NovoLog.  Tobacco abuse- counseled  History of CAD, status post 5 stents- continue Plavix, aspirin.  Hyperlipidemia- continue Crestor.  DVT prophylaxis-Lovenox  Code status: Full code  Family discussion: Admission, patients condition and plan of care including tests being ordered have been discussed with the patient and *her daughter at bedside* who indicate understanding and agree with the  plan and Code Status.   Time Spent on Admission: 65 min  [facility] Pager: [phone] 01/17/2015, 5:43 PM  If 7PM-7AM, please contact night-coverage  [email or web address]  Password [email or web address]

## 2015-01-18 ENCOUNTER — Inpatient Hospital Stay (HOSPITAL_COMMUNITY): Payer: Medicare Other

## 2015-01-18 DIAGNOSIS — J441 Chronic obstructive pulmonary disease with (acute) exacerbation: Secondary | ICD-10-CM

## 2015-01-18 DIAGNOSIS — E1165 Type 2 diabetes mellitus with hyperglycemia: Secondary | ICD-10-CM

## 2015-01-18 LAB — COMPREHENSIVE METABOLIC PANEL
ALT: 16 U/L (ref 14–54)
ANION GAP: 9 (ref 5–15)
AST: 15 U/L (ref 15–41)
Albumin: 2.4 g/dL — ABNORMAL LOW (ref 3.5–5.0)
Alkaline Phosphatase: 185 U/L — ABNORMAL HIGH (ref 38–126)
BUN: 10 mg/dL (ref 6–20)
CALCIUM: 7.8 mg/dL — AB (ref 8.9–10.3)
CHLORIDE: 98 mmol/L — AB (ref 101–111)
CO2: 23 mmol/L (ref 22–32)
CREATININE: 0.73 mg/dL (ref 0.44–1.00)
Glucose, Bld: 343 mg/dL — ABNORMAL HIGH (ref 65–99)
Potassium: 4.7 mmol/L (ref 3.5–5.1)
SODIUM: 130 mmol/L — AB (ref 135–145)
Total Bilirubin: 0.7 mg/dL (ref 0.3–1.2)
Total Protein: 5.6 g/dL — ABNORMAL LOW (ref 6.5–8.1)

## 2015-01-18 LAB — GLUCOSE, CAPILLARY
GLUCOSE-CAPILLARY: 267 mg/dL — AB (ref 65–99)
GLUCOSE-CAPILLARY: 298 mg/dL — AB (ref 65–99)
GLUCOSE-CAPILLARY: 279 mg/dL — AB (ref 65–99)
GLUCOSE-CAPILLARY: 353 mg/dL — AB (ref 65–99)

## 2015-01-18 LAB — STREP PNEUMONIAE URINARY ANTIGEN: Strep Pneumo Urinary Antigen: NEGATIVE

## 2015-01-18 LAB — CBC
HCT: 36.1 % (ref 36.0–46.0)
Hemoglobin: 12.1 g/dL (ref 12.0–15.0)
MCH: 30.3 pg (ref 26.0–34.0)
MCHC: 33.5 g/dL (ref 30.0–36.0)
MCV: 90.5 fL (ref 78.0–100.0)
PLATELETS: 223 10*3/uL (ref 150–400)
RBC: 3.99 MIL/uL (ref 3.87–5.11)
RDW: 13.5 % (ref 11.5–15.5)
WBC: 3.4 10*3/uL — ABNORMAL LOW (ref 4.0–10.5)

## 2015-01-18 LAB — HEMOGLOBIN A1C
HEMOGLOBIN A1C: 9.8 % — AB (ref 4.8–5.6)
MEAN PLASMA GLUCOSE: 235 mg/dL

## 2015-01-18 MED ORDER — IPRATROPIUM-ALBUTEROL 0.5-2.5 (3) MG/3ML IN SOLN
3.0000 mL | Freq: Three times a day (TID) | RESPIRATORY_TRACT | Status: DC
  Administered 2015-01-18 – 2015-01-20 (×5): 3 mL via RESPIRATORY_TRACT
  Filled 2015-01-18 (×5): qty 3

## 2015-01-18 MED ORDER — INSULIN ASPART 100 UNIT/ML ~~LOC~~ SOLN
4.0000 [IU] | Freq: Once | SUBCUTANEOUS | Status: AC
  Administered 2015-01-18: 4 [IU] via SUBCUTANEOUS

## 2015-01-18 NOTE — Progress Notes (Signed)
Notified K. Schorr for CBG 353 no HS correction has ordered.

## 2015-01-18 NOTE — Progress Notes (Signed)
[facility] PROGRESS NOTE  Brenda [name] RKY:[id number] DOB: 12-02-1950 DOA: 01/17/2015 PCP: Dwan Bolt, MD  Assessment/Plan: 1-Community acquired pneumonia Left Lower lobe PNA:  Continue with ceftriaxone and Zithromax day 2.  Strep Pneumonia negative, legionella pending.  Follow blood cultures.   Sepsis:  Presents with hypotension, leukocytosis, PNA.  Continue with IV fluids, IV antibiotics.  Vitals stable.   Dysphagia; will order esophagogram.   COPD exacerbation-  Continue with nebulizer and prednisone taper.   Diabetes mellitus-  continue Lantus 30 units subcutaneous daily, sliding scale insulin with NovoLog. Poor controlled, Hb-1c at 9.8  Tobacco abuse- counseled  History of CAD, status post 5 stents- continue Plavix, aspirin.  Hyperlipidemia- continue Crestor.  DVT prophylaxis-Lovenox  Code Status: Full Code.  Family Communication: care discussed with patient.  Disposition Plan: Remain in the step down.     Consultants:  none  Procedures:  Esophagogram.   Antibiotics: Ceftriaxone 8-16 Azithro 8-16  HPI/Subjective: Feeling better today. Breathing better.  She report difficulty with swallowing, solid food at times. Her mouths feels dry.    Objective: Filed Vitals:   01/18/15 0400  BP: 106/44  Pulse: 68  Temp: 97.3 F (36.3 C)  Resp: 19    Intake/Output Summary (Last 24 hours) at 01/18/15 0751 Last data filed at 01/18/15 0400  Gross per 24 hour  Intake   1000 ml  Output   2150 ml  Net  -1150 ml   Filed Weights   01/17/15 1359 01/17/15 1850  Weight: 59.421 kg (131 lb) 61.1 kg (134 lb 11.2 oz)    Exam:   General:  Alert in no distress.   Cardiovascular: S 1, S 2 RRR  Respiratory: no wheezing, few ronchus.   Abdomen: BS present, soft,. nt  Musculoskeletal: no edema.   Data Reviewed: Basic Metabolic Panel:  Recent Labs Lab 01/17/15 1536 01/18/15 0402  NA 128* 130*  K 4.5 4.7  CL 91* 98*  CO2 25 23   GLUCOSE 346* 343*  BUN 10 10  CREATININE 0.88 0.73  CALCIUM 8.7* 7.8*   Liver Function Tests:  Recent Labs Lab 01/18/15 0402  AST 15  ALT 16  ALKPHOS 185*  BILITOT 0.7  PROT 5.6*  ALBUMIN 2.4*   No results for input(s): LIPASE, AMYLASE in the last 168 hours. No results for input(s): AMMONIA in the last 168 hours. CBC:  Recent Labs Lab 01/17/15 1536 01/18/15 0402  WBC 12.4* 3.4*  NEUTROABS 11.5*  --   HGB 14.2 12.1  HCT 41.1 36.1  MCV 90.5 90.5  PLT 246 223   Cardiac Enzymes: No results for input(s): CKTOTAL, CKMB, CKMBINDEX, TROPONINI in the last 168 hours. BNP (last 3 results) No results for input(s): BNP in the last 8760 hours.  ProBNP (last 3 results) No results for input(s): PROBNP in the last 8760 hours.  CBG:  Recent Labs Lab 01/17/15 2315  GLUCAP 437*    Recent Results (from the past 240 hour(s))  Urine culture     Status: None (Preliminary result)   Collection Time: 01/17/15  4:27 PM  Result Value Ref Range Status   Specimen Description URINE, CLEAN CATCH  Final   Special Requests NONE  Final   Culture PENDING  Incomplete   Report Status PENDING  Incomplete  MRSA PCR Screening     Status: Abnormal   Collection Time: 01/17/15  6:49 PM  Result Value Ref Range Status   MRSA by PCR POSITIVE (A) NEGATIVE Final    Comment:  The GeneXpert MRSA Assay (FDA approved for NASAL specimens only), is one component of a comprehensive MRSA colonization surveillance program. It is not intended to diagnose MRSA infection nor to guide or monitor treatment for MRSA infections. RESULT CALLED TO, READ BACK BY AND VERIFIED WITH: Tyson Babinski RN [id number] 01/17/15 A BROWNING      Studies: Dg Chest 2 View  01/17/2015   CLINICAL DATA:  Oxygen dependent COPD patient presenting with worsening shortness of breath and chest pain and discomfort that began earlier today. Prior coronary artery stenting.  EXAM: CHEST  2 VIEW  COMPARISON:  01/04/2015 and earlier.   FINDINGS: Cardiac silhouette normal in size, unchanged. Thoracic aorta atherosclerotic, unchanged. Hilar and mediastinal contours otherwise unremarkable. Emphysematous changes in the upper lobes. Prominent bronchovascular markings diffusely and moderate central peribronchial thickening, unchanged. Patchy airspace opacities in the left lower lobe, new since the most recent prior examination. Lungs otherwise clear. No pleural effusions. No pneumothorax. Degenerative changes involving the thoracic spine. Old healed right clavicle fracture.  IMPRESSION: Acute pneumonia involving the left lower lobe superimposed upon baseline COPD/emphysema.   Electronically Signed   By: Evangeline Dakin M.D.   On: 01/17/2015 15:12    Scheduled Meds: . albuterol  2.5 mg Nebulization Q6H  . aspirin EC  81 mg Oral Daily  . azithromycin  500 mg Intravenous Q24H  . buPROPion  150 mg Oral Daily  . cefTRIAXone (ROCEPHIN)  IV  1 g Intravenous Q24H  . Chlorhexidine Gluconate Cloth  6 each Topical Q0600  . clopidogrel  75 mg Oral Daily  . enoxaparin (LOVENOX) injection  40 mg Subcutaneous Q24H  . insulin aspart  0-9 Units Subcutaneous TID WC  . insulin glargine  30 Units Subcutaneous Daily  . ipratropium  0.5 mg Nebulization Q6H  . mupirocin ointment  1 application Nasal BID  . omega-3 acid ethyl esters  1 g Oral BID  . pantoprazole  40 mg Oral Daily  . predniSONE  50 mg Oral Q breakfast   Followed by  . [START ON 01/19/2015] predniSONE  40 mg Oral Q breakfast   Followed by  . [START ON 01/20/2015] predniSONE  30 mg Oral Q breakfast   Followed by  . [START ON 01/21/2015] predniSONE  20 mg Oral Q breakfast   Followed by  . [START ON 01/22/2015] predniSONE  10 mg Oral Q breakfast  . rosuvastatin  20 mg Oral Daily   Continuous Infusions: . sodium chloride 125 mL/hr at 01/18/15 0430    Active Problems:   DM (diabetes mellitus), type 2, uncontrolled   Tobacco abuse   COPD exacerbation   Pneumonia    Time spent: 35  minutes.     Niel Hummer A  [facility] Pager [phone]. If 7PM-7AM, please contact night-coverage at [email or web address], password [email or web address] 01/18/2015, 7:51 AM  LOS: 1 day

## 2015-01-18 NOTE — Progress Notes (Signed)
Utilization Review Completed.  

## 2015-01-19 ENCOUNTER — Encounter (HOSPITAL_COMMUNITY): Payer: Self-pay | Admitting: Radiology

## 2015-01-19 ENCOUNTER — Inpatient Hospital Stay (HOSPITAL_COMMUNITY): Payer: Medicare Other

## 2015-01-19 LAB — CBC
HCT: 35.2 % — ABNORMAL LOW (ref 36.0–46.0)
Hemoglobin: 11.9 g/dL — ABNORMAL LOW (ref 12.0–15.0)
MCH: 30.5 pg (ref 26.0–34.0)
MCHC: 33.8 g/dL (ref 30.0–36.0)
MCV: 90.3 fL (ref 78.0–100.0)
Platelets: 248 10*3/uL (ref 150–400)
RBC: 3.9 MIL/uL (ref 3.87–5.11)
RDW: 13.5 % (ref 11.5–15.5)
WBC: 6 10*3/uL (ref 4.0–10.5)

## 2015-01-19 LAB — BASIC METABOLIC PANEL
ANION GAP: 7 (ref 5–15)
BUN: 10 mg/dL (ref 6–20)
CALCIUM: 8.8 mg/dL — AB (ref 8.9–10.3)
CO2: 29 mmol/L (ref 22–32)
CREATININE: 0.65 mg/dL (ref 0.44–1.00)
Chloride: 101 mmol/L (ref 101–111)
Glucose, Bld: 94 mg/dL (ref 65–99)
Potassium: 4 mmol/L (ref 3.5–5.1)
SODIUM: 137 mmol/L (ref 135–145)

## 2015-01-19 LAB — TSH: TSH: 1.41 u[IU]/mL (ref 0.350–4.500)

## 2015-01-19 LAB — LEGIONELLA ANTIGEN, URINE

## 2015-01-19 LAB — URINE CULTURE

## 2015-01-19 LAB — GLUCOSE, CAPILLARY
GLUCOSE-CAPILLARY: 206 mg/dL — AB (ref 65–99)
GLUCOSE-CAPILLARY: 264 mg/dL — AB (ref 65–99)
GLUCOSE-CAPILLARY: 241 mg/dL — AB (ref 65–99)
Glucose-Capillary: 67 mg/dL (ref 65–99)
Glucose-Capillary: 199 mg/dL — ABNORMAL HIGH (ref 65–99)

## 2015-01-19 LAB — VITAMIN B12: VITAMIN B 12: 535 pg/mL (ref 180–914)

## 2015-01-19 MED ORDER — IOHEXOL 300 MG/ML  SOLN
75.0000 mL | Freq: Once | INTRAMUSCULAR | Status: AC | PRN
  Administered 2015-01-19: 75 mL via INTRAVENOUS

## 2015-01-19 NOTE — Progress Notes (Signed)
Patient being transferred to [phone]. Report called to receiving nurse. Patient notified family of transfer.

## 2015-01-19 NOTE — Progress Notes (Signed)
[facility] PROGRESS NOTE  Brenda [name]:[id number] DOB: 1950-12-06 DOA: 01/17/2015 PCP: Dwan Bolt, MD  Assessment/Plan: 1-Community acquired pneumonia Left Lower lobe PNA:  Continue with ceftriaxone and Zithromax day 3.  Strep Pneumonia negative, legionella pending.  Follow blood cultures.   Sepsis:  Presents with hypotension, leukocytosis, PNA.  Continue with IV fluids, IV antibiotics.  Vitals stable, ok to transfer to regular floor.   Dysphagia; esophagogram with presbyesophagus. Patient needs to follow up with Dr hung. Patient aware.  Patient with history of tongue cancer. Esophagogram with with left side mass effect upon the hypo-pharyngeal area. Dr Redmond Baseman consulted.   COPD exacerbation-  Continue with nebulizer and prednisone taper.   Diabetes mellitus-  continue Lantus 30 units subcutaneous daily, sliding scale insulin with NovoLog. Poor controlled, Hb-1c at 9.8 Glucose this am at 94. Continue with current regimen.   Tobacco abuse- counseled  History of CAD, status post 5 stents- continue Plavix, aspirin.  Hyperlipidemia- continue Crestor.  DVT prophylaxis-Lovenox  Code Status: Full Code.  Family Communication: care discussed with patient.  Disposition Plan: Remain in the step down.     Consultants:  none  Procedures:  Esophagogram.   Antibiotics: Ceftriaxone 8-16 Azithro 8-16  HPI/Subjective: Feeling weak , still. Breathing better. Coughing some phlegm.     Objective: Filed Vitals:   01/19/15 0800  BP: 156/68  Pulse: 66  Temp: 97.8 F (36.6 C)  Resp: 10    Intake/Output Summary (Last 24 hours) at 01/19/15 0926 Last data filed at 01/19/15 0800  Gross per 24 hour  Intake   1845 ml  Output   1700 ml  Net    145 ml   Filed Weights   01/17/15 1359 01/17/15 1850  Weight: 59.421 kg (131 lb) 61.1 kg (134 lb 11.2 oz)    Exam:   General:  Alert in no distress.   Cardiovascular: S 1, S 2 RRR  Respiratory: no  wheezing, few ronchus.   Abdomen: BS present, soft,. nt  Musculoskeletal: no edema.   Data Reviewed: Basic Metabolic Panel:  Recent Labs Lab 01/17/15 1536 01/18/15 0402 01/19/15 0435  NA 128* 130* 137  K 4.5 4.7 4.0  CL 91* 98* 101  CO2 '25 23 29  '$ GLUCOSE 346* 343* 94  BUN '10 10 10  '$ CREATININE 0.88 0.73 0.65  CALCIUM 8.7* 7.8* 8.8*   Liver Function Tests:  Recent Labs Lab 01/18/15 0402  AST 15  ALT 16  ALKPHOS 185*  BILITOT 0.7  PROT 5.6*  ALBUMIN 2.4*   No results for input(s): LIPASE, AMYLASE in the last 168 hours. No results for input(s): AMMONIA in the last 168 hours. CBC:  Recent Labs Lab 01/17/15 1536 01/18/15 0402 01/19/15 0435  WBC 12.4* 3.4* 6.0  NEUTROABS 11.5*  --   --   HGB 14.2 12.1 11.9*  HCT 41.1 36.1 35.2*  MCV 90.5 90.5 90.3  PLT 246 223 248   Cardiac Enzymes: No results for input(s): CKTOTAL, CKMB, CKMBINDEX, TROPONINI in the last 168 hours. BNP (last 3 results) No results for input(s): BNP in the last 8760 hours.  ProBNP (last 3 results) No results for input(s): PROBNP in the last 8760 hours.  CBG:  Recent Labs Lab 01/17/15 2315 01/18/15 0834 01/18/15 1257 01/18/15 1721 01/18/15 1952  GLUCAP 437* 267* 298* 279* 353*    Recent Results (from the past 240 hour(s))  Blood Culture (routine x 2)     Status: None (Preliminary result)   Collection Time: 01/17/15  3:36  PM  Result Value Ref Range Status   Specimen Description BLOOD RIGHT HAND  Final   Special Requests BOTTLES DRAWN AEROBIC ONLY 2CC  Final   Culture NO GROWTH < 24 HOURS  Final   Report Status PENDING  Incomplete  Urine culture     Status: None (Preliminary result)   Collection Time: 01/17/15  4:27 PM  Result Value Ref Range Status   Specimen Description URINE, CLEAN CATCH  Final   Special Requests NONE  Final   Culture CULTURE REINCUBATED FOR BETTER GROWTH  Final   Report Status PENDING  Incomplete  Blood Culture (routine x 2)     Status: None (Preliminary  result)   Collection Time: 01/17/15  4:42 PM  Result Value Ref Range Status   Specimen Description BLOOD LEFT FOREARM  Final   Special Requests BOTTLES DRAWN AEROBIC AND ANAEROBIC 10CC  Final   Culture NO GROWTH < 24 HOURS  Final   Report Status PENDING  Incomplete  MRSA PCR Screening     Status: Abnormal   Collection Time: 01/17/15  6:49 PM  Result Value Ref Range Status   MRSA by PCR POSITIVE (A) NEGATIVE Final    Comment:        The GeneXpert MRSA Assay (FDA approved for NASAL specimens only), is one component of a comprehensive MRSA colonization surveillance program. It is not intended to diagnose MRSA infection nor to guide or monitor treatment for MRSA infections. RESULT CALLED TO, READ BACK BY AND VERIFIED WITH: Tyson Babinski RN 2112 01/17/15 A BROWNING      Studies: Dg Chest 2 View  01/17/2015   CLINICAL DATA:  Oxygen dependent COPD patient presenting with worsening shortness of breath and chest pain and discomfort that began earlier today. Prior coronary artery stenting.  EXAM: CHEST  2 VIEW  COMPARISON:  01/04/2015 and earlier.  FINDINGS: Cardiac silhouette normal in size, unchanged. Thoracic aorta atherosclerotic, unchanged. Hilar and mediastinal contours otherwise unremarkable. Emphysematous changes in the upper lobes. Prominent bronchovascular markings diffusely and moderate central peribronchial thickening, unchanged. Patchy airspace opacities in the left lower lobe, new since the most recent prior examination. Lungs otherwise clear. No pleural effusions. No pneumothorax. Degenerative changes involving the thoracic spine. Old healed right clavicle fracture.  IMPRESSION: Acute pneumonia involving the left lower lobe superimposed upon baseline COPD/emphysema.   Electronically Signed   By: Evangeline Dakin M.D.   On: 01/17/2015 15:12   Dg Esophagus  01/18/2015   CLINICAL DATA:  Dysphagia. History of esophageal dilatation 2 years ago. 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  EXAM: ESOPHOGRAM/BARIUM SWALLOW  TECHNIQUE: Combined double contrast and single contrast examination performed using effervescent crystals, thick barium liquid, and thin barium liquid.  FLUOROSCOPY TIME:  3 minutes and 54 seconds  COMPARISON:  Chest radiograph of 01/17/2015. Prior esophagram of 12/10/2011  FINDINGS: Hypopharyngeal portion of exam demonstrates deviation of the contrast column to the right, secondary to mass effect within the mid left neck. Example image 65 of series 1. Subtle low less conspicuous findings identified on the prior exam. Lateral hypo pharyngeal evaluation demonstrates no aspiration or penetration.  Double contrast evaluation esophagus is limited, as patient had difficulty swallowing the effervescent crystals. No gross mucosal abnormality identified.  Evaluation of primary peristalsis demonstrates an incomplete primary peristaltic wave, with significant contrast stasis throughout the thoracic esophagus.  The patient could not be placed in an RAO position. A focused LPO positions single-contrast evaluation demonstrates no persistent narrowing  or stricture. The patient had gagging episodes with multiple swallows, further limiting the exam.  A 13 mm barium tablet has delayed passage in the lower esophagus. This delayed persisted, even with subsequent barium swallows.  IMPRESSION: 1. Degraded exam, secondary to patient immobility and difficulty with multiple swallows. 2. Moderate esophageal dysmotility, likely presbyesophagus. 3. No definite esophageal stricture identified. 4. Delayed passage of 13 mm barium tablet in the lower esophagus, favored to be related to dysmotility. 5. Left-sided mass effect upon the hypo pharyngeal contrast column, with deviation to the right. This was subtly apparent back in 2013, but less well-defined on that exam. Although this could be radiation induced, recurrent disease cannot be excluded. Consider direct  visualization.   Electronically Signed   By: Abigail Miyamoto M.D.   On: 01/18/2015 12:24    Scheduled Meds: . aspirin EC  81 mg Oral Daily  . azithromycin  500 mg Intravenous Q24H  . buPROPion  150 mg Oral Daily  . cefTRIAXone (ROCEPHIN)  IV  1 g Intravenous Q24H  . Chlorhexidine Gluconate Cloth  6 each Topical Q0600  . clopidogrel  75 mg Oral Daily  . enoxaparin (LOVENOX) injection  40 mg Subcutaneous Q24H  . insulin aspart  0-9 Units Subcutaneous TID WC  . insulin glargine  30 Units Subcutaneous Daily  . ipratropium-albuterol  3 mL Nebulization TID  . mupirocin ointment  1 application Nasal BID  . omega-3 acid ethyl esters  1 g Oral BID  . pantoprazole  40 mg Oral Daily  . [START ON 01/20/2015] predniSONE  30 mg Oral Q breakfast   Followed by  . [START ON 01/21/2015] predniSONE  20 mg Oral Q breakfast   Followed by  . [START ON 01/22/2015] predniSONE  10 mg Oral Q breakfast  . rosuvastatin  20 mg Oral Daily   Continuous Infusions: . sodium chloride 75 mL/hr at 01/18/15 2304    Active Problems:   DM (diabetes mellitus), type 2, uncontrolled   Tobacco abuse   COPD exacerbation   Pneumonia    Time spent: 35 minutes.     Niel Hummer A  [facility] Pager [phone]. If 7PM-7AM, please contact night-coverage at [email or web address], password [facility] 01/19/2015, 9:26 AM  LOS: 2 days

## 2015-01-20 DIAGNOSIS — J189 Pneumonia, unspecified organism: Secondary | ICD-10-CM

## 2015-01-20 LAB — GLUCOSE, CAPILLARY
Glucose-Capillary: 101 mg/dL — ABNORMAL HIGH (ref 65–99)
Glucose-Capillary: 69 mg/dL (ref 65–99)
Glucose-Capillary: 170 mg/dL — ABNORMAL HIGH (ref 65–99)

## 2015-01-20 MED ORDER — INSULIN GLARGINE 100 UNIT/ML ~~LOC~~ SOLN: 20.0000 [IU] | Freq: Every day | SUBCUTANEOUS | Status: DC

## 2015-01-20 MED ORDER — LEVOFLOXACIN 750 MG PO TABS: 750.0000 mg | ORAL_TABLET | Freq: Every day | ORAL | Status: DC

## 2015-01-20 MED ORDER — ALBUTEROL SULFATE HFA 108 (90 BASE) MCG/ACT IN AERS: 2.0000 | INHALATION_SPRAY | Freq: Four times a day (QID) | RESPIRATORY_TRACT | Status: DC | PRN

## 2015-01-20 MED ORDER — PREDNISONE 20 MG PO TABS: ORAL_TABLET | ORAL | Status: DC

## 2015-01-20 MED ORDER — IPRATROPIUM-ALBUTEROL 0.5-2.5 (3) MG/3ML IN SOLN: 3.0000 mL | RESPIRATORY_TRACT | Status: DC | PRN

## 2015-01-20 MED ORDER — NITROGLYCERIN 0.4 MG/SPRAY TL SOLN: 1.0000 | Status: DC | PRN

## 2015-01-20 MED ORDER — INSULIN GLARGINE 100 UNIT/ML ~~LOC~~ SOLN: 30.0000 [IU] | Freq: Every day | SUBCUTANEOUS | Status: DC

## 2015-01-20 MED ORDER — LEVOFLOXACIN 750 MG PO TABS
750.0000 mg | ORAL_TABLET | Freq: Every day | ORAL | Status: DC
  Administered 2015-01-20: 750 mg via ORAL
  Filled 2015-01-20: qty 1

## 2015-01-20 NOTE — Care Management Note (Addendum)
Case Management Note  Patient Details  Name: Brenda [name] MRN: [id number] Date of Birth: [birth year]  Subjective/Objective:         Patient lives with spouse, await pt eval.  Patient is for dc today,  NCM will cont to follow,  for dc needs.   Per pt eval patient has no pt needs.            Action/Plan:   Expected Discharge Date:                  Expected Discharge Plan:  [place]  In-House Referral:     Discharge planning Services  CM Consult  Post Acute Care Choice:    Choice offered to:     DME Arranged:    DME Agency:     HH Arranged:    [place] Agency:     Status of Service:  In process, will continue to follow  Medicare Important Message Given:  Yes-second notification given Date Medicare IM Given:    Medicare IM give by:    Date Additional Medicare IM Given:    Additional Medicare Important Message give by:     If discussed at [place] of Stay Meetings, dates discussed:    Additional Comments:  Zenon Mayo, RN 01/20/2015, 10:56 AM

## 2015-01-20 NOTE — Care Management Note (Signed)
Case Management Note  Patient Details  Name: Brenda [name] MRN: [id number] Date of Birth: July 08, 1950  Subjective/Objective:   Patient indep , no needs.                 Action/Plan:   Expected Discharge Date:                  Expected Discharge Plan:  Home/Self Care  In-House Referral:     Discharge planning Services  CM Consult  Post Acute Care Choice:    Choice offered to:     DME Arranged:    DME Agency:     HH Arranged:    [place] Agency:     Status of Service:  Completed, signed off  Medicare Important Message Given:  Yes-second notification given Date Medicare IM Given:    Medicare IM give by:    Date Additional Medicare IM Given:    Additional Medicare Important Message give by:     If discussed at [place] of Stay Meetings, dates discussed:    Additional Comments:  Zenon Mayo, RN 01/20/2015, 10:59 AM

## 2015-01-20 NOTE — Care Management Important Message (Signed)
Important Message  Patient Details  Name: Brenda [name] MRN: [id number] Date of Birth: 11-04-1950   Medicare Important Message Given:  Yes-second notification given    Zenon Mayo, RN 01/20/2015, 10:55 AMImportant Message  Patient Details  Name: Brenda [name] MRN: [id number] Date of Birth: 1950/06/18   Medicare Important Message Given:  Yes-second notification given    Zenon Mayo, RN 01/20/2015, 10:55 AM

## 2015-01-20 NOTE — Discharge Summary (Signed)
Physician Discharge Summary  Brenda [name] SNK:[id number] DOB: 27-Jul-1950 DOA: 01/17/2015  PCP: Dwan Bolt, MD  Admit date: 01/17/2015 Discharge date: 01/20/2015  Time spent: 35 minutes  Recommendations for Outpatient Follow-up:  1. Needs follow up with GI for further evolution of dysphagia.  2. Follow up resolution of PNA 3. Further adjustment of insulin  Discharge Diagnoses:    Pneumonia   DM (diabetes mellitus), type 2, uncontrolled   Tobacco abuse   COPD exacerbation    Discharge Condition: stable.   Diet recommendation: Carb modified diet  Filed Weights   01/17/15 1359 01/17/15 1850  Weight: 59.421 kg (131 lb) 61.1 kg (134 lb 11.2 oz)    History of present illness:  64 year old female who came to  has a past medical history of History of DVT (deep vein thrombosis); Coronary artery disease; COPD (chronic obstructive pulmonary disease); Transient ischemic attack; Depression; radiation therapy (01/16/09 - 03/06/09); Peripheral arterial disease; Pneumonia; Hypercholesteremia; Chronic bronchitis; Shortness of breath; Type II diabetes mellitus; History of blood transfusion (1955); GERD (gastroesophageal reflux disease); H/O hiatal hernia; Stroke (2000); Arthritis; 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 Today patient came to the hospital with chief complaint of shortness of breath. Patient says that she got emotionally upset at home, which caused her to have shortness of breath. She tried inhaler which did not help she also put her oxygen on which also did not help with the breathing. So patient died 18. EMS arrived and gave patient breathing treatments, which made patient feel better. Patient was recently prescribed Keflex 500 mg 4 times a day by her primary care provider, 10 days ago for possible pneumonia versus UTI. At that time chest x-ray and UA were obtained by her PCP, patient not aware of  the results. Patient says that she has been feeling generally weak, also had fever with chills on Friday. She did have intermittent dysuria but denies difficulty urination at this time. Patient has a history of COPD and was prescribed 24-hour oxygen which she has weaned off to only when necessary basis. In the ED patient was found to be hypotensive, chest x-ray showed acute pneumonia involving the left lower lobe superimposed upon baseline COPD/emphysema.  Hospital Course:  1-Community acquired pneumonia Left Lower lobe PNA:  received ceftriaxone and Zithromax for 3 days.  Strep Pneumonia negative, legionella negative Follow blood cultures.  She will be discharge on Levaquin for 5 days.   Sepsis:  Presents with hypotension, leukocytosis, PNA.  Continue with IV fluids, IV antibiotics.  Vitals stable, ok to transfer to regular floor.   Dysphagia; esophagogram with presbyesophagus. Patient needs to follow up with Dr hung. Patient aware.  Patient with history of tongue cancer. Esophagogram with with left side mass effect upon the hypo-pharyngeal area. Dr Redmond Baseman consulted.  CT neck ordered and negative for recurrence of malignancy.  Frequent small melas. Follow up with GI .   COPD exacerbation- Continue with nebulizer and prednisone taper.   Diabetes mellitus- sliding scale insulin with NovoLog. Poor controlled, Hb-1c at 9.8 Glucose low this am. Will decrease lantus to 20 units.   Tobacco abuse- counseled  History of CAD, status post 5 stents- continue Plavix, aspirin.  Hyperlipidemia- continue Crestor.  DVT prophylaxis-Lovenox  Procedures: Esophagogram; 1. Degraded exam, secondary to patient immobility and difficulty with multiple swallows. 2. Moderate esophageal dysmotility, likely presbyesophagus. 3. No definite esophageal stricture identified. 4. Delayed passage of 13 mm barium tablet in the lower esophagus, favored  to be related to dysmotility. 5. Left-sided mass  effect upon the hypo pharyngeal contrast column, with deviation to the right. This was subtly apparent back in 2013, but less well-defined on that exam. Although this could be radiation induced, recurrent disease cannot be excluded. Consider direct  visualization.  Consultations:  Dr Redmond Baseman  Discharge Exam: Filed Vitals:   01/20/15 0456  BP: 150/56  Pulse: 71  Temp: 98.2 F (36.8 C)  Resp: 18    General: Alert in no distress.  Cardiovascular: S 1, S 2 RRR Respiratory: CTA  Discharge Instructions   Discharge Instructions    Diet - low sodium heart healthy    Complete by:  As directed      Increase activity slowly    Complete by:  As directed           Current Discharge Medication List    START taking these medications   Details  levofloxacin (LEVAQUIN) 750 MG tablet Take 1 tablet (750 mg total) by mouth daily. Qty: 5 tablet, Refills: 0    predniSONE (DELTASONE) 20 MG tablet Take 1 tablet daily for 3 days. Qty: 3 tablet, Refills: 0      CONTINUE these medications which have CHANGED   Details  albuterol (PROVENTIL HFA;VENTOLIN HFA) 108 (90 BASE) MCG/ACT inhaler Inhale 2 puffs into the lungs every 6 (six) hours as needed for wheezing or shortness of breath. Qty: 1 Inhaler, Refills: 0    insulin glargine (LANTUS) 100 UNIT/ML injection Inject 0.3 mLs (30 Units total) into the skin daily. Qty: 10 mL, Refills: 11    nitroGLYCERIN (NITROLINGUAL) 0.4 MG/SPRAY spray Place 1 spray under the tongue every 5 (five) minutes as needed for chest pain. Qty: 12 g, Refills: 0      CONTINUE these medications which have NOT CHANGED   Details  acetaminophen (TYLENOL) 500 MG tablet Take 1,000 mg by mouth every 6 (six) hours as needed for pain.    aspirin EC 81 MG tablet Take 81 mg by mouth daily.    buPROPion (WELLBUTRIN XL) 150 MG 24 hr tablet Take 1 tablet (150 mg total) by mouth daily. Qty: 30 tablet, Refills: 0    clopidogrel (PLAVIX) 75 MG tablet Take 75 mg by mouth  daily.     docusate sodium (COLACE) 100 MG capsule Take 100 mg by mouth daily.    fish oil-omega-3 fatty acids 1000 MG capsule Take 1 g by mouth 2 (two) times daily.     Fluticasone Furoate-Vilanterol (BREO ELLIPTA IN) Inhale 1 puff into the lungs daily.    Multiple Vitamins-Minerals (MULTIVITAMIN & MINERAL PO) Take 1 tablet by mouth daily.    omeprazole (PRILOSEC) 20 MG capsule Take 20 mg by mouth daily.     rosuvastatin (CRESTOR) 20 MG tablet Take 20 mg by mouth daily.    Tiotropium Bromide Monohydrate (SPIRIVA RESPIMAT) 2.5 MCG/ACT AERS Inhale 2 puffs into the lungs daily. Qty: 1 Inhaler, Refills: 11      STOP taking these medications     cephALEXin (KEFLEX) 500 MG capsule        Allergies  Allergen Reactions  . Tape Hives and Other (See Comments)    USE PAPER TAPE ONLY- Adhesive peels off skin-makes pt. Raw.  Maryjo Rochester [Nicotine] Rash    To the PATCH only, breakouts on skin   Follow-up Information    Follow up with Dwan Bolt, MD In 1 week.   Specialty:  Endocrinology   Contact information:   [address] [place] [address] [phone]        The results of significant diagnostics from this hospitalization (including imaging, microbiology, ancillary and laboratory) are listed below for reference.    Significant Diagnostic Studies: Dg Chest 2 View  01/17/2015   CLINICAL DATA:  Oxygen dependent COPD patient presenting with worsening shortness of breath and chest pain and discomfort that began earlier today. Prior coronary artery stenting.  EXAM: CHEST  2 VIEW  COMPARISON:  01/04/2015 and earlier.  FINDINGS: Cardiac silhouette normal in size, unchanged. Thoracic aorta atherosclerotic, unchanged. Hilar and mediastinal contours otherwise unremarkable. Emphysematous changes in the upper lobes. Prominent bronchovascular markings diffusely and moderate central peribronchial thickening, unchanged. Patchy airspace opacities in the left lower  lobe, new since the most recent prior examination. Lungs otherwise clear. No pleural effusions. No pneumothorax. Degenerative changes involving the thoracic spine. Old healed right clavicle fracture.  IMPRESSION: Acute pneumonia involving the left lower lobe superimposed upon baseline COPD/emphysema.   Electronically Signed   By: Evangeline Dakin M.D.   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 Dysphagia. Difficulty swallowing because of post treatment effect. Abnormal esophagram in the hypopharyngeal region. Evaluate for possible recurrent tumor.  EXAM: CT NECK WITH CONTRAST  TECHNIQUE: Multidetector CT imaging of the neck was performed using the standard protocol following the bolus administration of intravenous contrast.  CONTRAST:  64m OMNIPAQUE IOHEXOL 300 MG/ML  SOLN  COMPARISON:  None.  FINDINGS: Pharynx and larynx: No recurrent tumor or mucosal lesion is seen. Asymmetric enlargement LEFT cornu of the hyoid could contribute to effacement of the LEFT area epiglottic fold and LEFT hypopharyngeal asymmetry.  Salivary glands: Atrophic.  Thyroid: Unremarkable.  Lymph nodes: No pathologic adenopathy. Symmetric post radiation changes with stranding of the fat in the soft tissues of the neck.  Vascular: Moderately advanced calcification at the carotid bifurcations without definite flow reducing lesion.  Limited intracranial: Negative.  Visualized orbits: Negative.  BILATERAL cataract extraction.  Mastoids and visualized paranasal sinuses: No significant fluid accumulation or air-fluid level.  Skeleton: Spondylosis.  Osteopenia.  Upper chest: COPD.  No pulmonary nodules or effusion.  IMPRESSION: No recurrent tumor or mucosal lesion is seen. Asymmetric enlargement of the LEFT cornu of the hyoid bone could contribute to the observed hypopharyngeal effacement.  Post treatment changes as described.    Electronically Signed   By: JStaci RighterM.D.   On: 01/19/2015 20:26   Dg Esophagus  01/18/2015   CLINICAL DATA:  Dysphagia. History of esophageal dilatation 2 years ago. History of head neck cancer. Tongue base carcinoma with prior radiation therapy.  EXAM: ESOPHOGRAM/BARIUM SWALLOW  TECHNIQUE: Combined double contrast and single contrast examination performed using effervescent crystals, thick barium liquid, and thin barium liquid.  FLUOROSCOPY TIME:  3 minutes and 54 seconds  COMPARISON:  Chest radiograph of 01/17/2015. Prior esophagram of 12/10/2011  FINDINGS: Hypopharyngeal portion of exam demonstrates deviation of the contrast column to the right, secondary to mass effect within the mid left neck. Example image 65 of series 1. Subtle low less conspicuous findings identified on the prior exam. Lateral hypo pharyngeal evaluation demonstrates no aspiration or penetration.  Double contrast evaluation esophagus is limited, as patient had difficulty swallowing the effervescent crystals. No gross mucosal abnormality identified.  Evaluation of primary peristalsis demonstrates an incomplete primary peristaltic wave, with significant contrast stasis throughout the thoracic esophagus.  The patient could not be placed in an RAO position. A focused LPO positions single-contrast evaluation demonstrates no persistent narrowing or stricture. The patient had gagging episodes with multiple swallows, further limiting the exam.  A 13 mm barium tablet has delayed passage in the lower esophagus. This delayed persisted, even with subsequent barium swallows.  IMPRESSION: 1. Degraded exam, secondary to patient immobility and difficulty with multiple swallows. 2. Moderate esophageal dysmotility, likely presbyesophagus. 3. No definite esophageal stricture identified. 4. Delayed passage of 13 mm barium tablet in the lower esophagus, favored to be related to dysmotility. 5. Left-sided mass effect upon the hypo pharyngeal contrast  column, with deviation to the right. This was subtly apparent back in 2013, but less well-defined on that exam. Although this could be radiation induced, recurrent disease cannot be excluded. Consider direct visualization.   Electronically Signed   By: Abigail Miyamoto M.D.   On: 01/18/2015 12:24    Microbiology: Recent Results (from the past 240 hour(s))  Blood Culture (routine x 2)     Status: None (Preliminary result)   Collection Time: 01/17/15  3:36 PM  Result Value Ref Range Status   Specimen Description BLOOD RIGHT HAND  Final   Special Requests BOTTLES DRAWN AEROBIC ONLY 2CC  Final   Culture NO GROWTH 2 DAYS  Final   Report Status PENDING  Incomplete  Urine culture     Status: None   Collection Time: 01/17/15  4:27 PM  Result Value Ref Range Status   Specimen Description URINE, CLEAN CATCH  Final   Special Requests NONE  Final   Culture MULTIPLE SPECIES PRESENT, SUGGEST RECOLLECTION  Final   Report Status 01/19/2015 FINAL  Final  Blood Culture (routine x 2)     Status: None (Preliminary result)   Collection Time: 01/17/15  4:42 PM  Result Value Ref Range Status   Specimen Description BLOOD LEFT FOREARM  Final   Special Requests BOTTLES DRAWN AEROBIC AND ANAEROBIC 10CC  Final   Culture NO GROWTH 2 DAYS  Final   Report Status PENDING  Incomplete  MRSA PCR Screening     Status: Abnormal   Collection Time: 01/17/15  6:49 PM  Result Value Ref Range Status   MRSA by PCR POSITIVE (A) NEGATIVE Final    Comment:        The GeneXpert MRSA Assay (FDA approved for NASAL specimens only), is one component of a comprehensive MRSA colonization surveillance program. It is not intended to diagnose MRSA infection nor to guide or monitor treatment for MRSA infections. RESULT CALLED TO, READ BACK BY AND VERIFIED WITH: Tyson Babinski RN [id number] 01/17/15 A BROWNING      Labs: Basic Metabolic Panel:  Recent Labs Lab 01/17/15 1536 01/18/15 0402 01/19/15 0435  NA 128* 130* 137  K 4.5 4.7 4.0   CL 91* 98* 101  CO2 '25 23 29  '$ GLUCOSE 346* 343* 94  BUN '10 10 10  '$ CREATININE 0.88 0.73 0.65  CALCIUM 8.7* 7.8* 8.8*   Liver Function Tests:  Recent Labs Lab 01/18/15 0402  AST 15  ALT 16  ALKPHOS 185*  BILITOT 0.7  PROT 5.6*  ALBUMIN 2.4*   No results for input(s): LIPASE, AMYLASE in the last 168 hours. No results for input(s): AMMONIA in the last 168 hours. CBC:  Recent Labs Lab 01/17/15 1536 01/18/15 0402 01/19/15 0435  WBC 12.4* 3.4* 6.0  NEUTROABS 11.5*  --   --   HGB 14.2 12.1 11.9*  HCT 41.1 36.1 35.2*  MCV 90.5 90.5 90.3  PLT 246 223 248   Cardiac Enzymes: No results for input(s): CKTOTAL, CKMB, CKMBINDEX, TROPONINI in the last 168 hours. BNP: BNP (last 3 results) No results for input(s): BNP in the last 8760 hours.  ProBNP (last 3 results) No results for input(s): PROBNP in the last 8760 hours.  CBG:  Recent Labs Lab 01/19/15 1215 01/19/15 1649 01/19/15 2206 01/20/15 0738 01/20/15 0813  GLUCAP 206* 264* 241* 69 101*       Signed:  Enas Winchel A  [facility] 01/20/2015, 10:23 AM

## 2015-01-20 NOTE — Evaluation (Addendum)
Physical Therapy Evaluation Patient Details Name: Brenda [name] MRN: [id number] DOB: 1951/03/27 Today's Date: 01/20/2015   History of Present Illness  Adm 01/17/15 with CAP PMHx-COPD, TIA, CAD, HTN, tongue Ca  Clinical Impression  Patient evaluated by Physical Therapy with no further acute PT needs identified. Patient is at her baseline (modified independent). No need for OT eval at this time (pt demonstrated independence with lower body dressing) and she denies any problems with ADLs. PT is signing off. Thank you for this referral.     Follow Up Recommendations No PT follow up    Equipment Recommendations  None recommended by PT    Recommendations for Other Services       Precautions / Restrictions        Mobility  Bed Mobility Overal bed mobility: Modified Independent                Transfers Overall transfer level: Independent Equipment used: None                Ambulation/Gait Ambulation/Gait assistance: Modified independent (Device/Increase time) Ambulation Distance (Feet): 50 Feet Assistive device: None Gait Pattern/deviations: WFL(Within Functional Limits)   Gait velocity interpretation: Below normal speed for age/gender General Gait Details: decr velocity (states she does this to maintain her balance)  Stairs            Wheelchair Mobility    Modified Rankin (Stroke Patients Only)       Balance Overall balance assessment: Independent         Standing balance support: No upper extremity supported Standing balance-Leahy Scale: Normal       Tandem Stance - Right Leg: 30 Tandem Stance - Left Leg: 30 Rhomberg - Eyes Opened: 60 Rhomberg - Eyes Closed: 5   High Level Balance Comments: able to stand with feet shoulder width apart and eyes closed x 30 sec with no incr sway             Pertinent Vitals/Pain Pain Assessment: No/denies pain    Home Living Family/patient expects to be discharged to:: Private residence Living  Arrangements: Spouse/significant other;Children Available Help at Discharge: Family;Available PRN/intermittently Type of Home: House Home Access: Stairs to enter Entrance Stairs-Rails: Right Entrance Stairs-Number of Steps: 3 Home Layout: Two level;Able to live on main level with bedroom/bathroom Home Equipment: Bedside commode;Shower seat;Walker - 2 wheels;Cane - single point      Prior Function Level of Independence: Independent with assistive device(s)         Comments: uses no device unless she has a busy day and becomes overly tired, then she may use cane or RW     Hand Dominance   Dominant Hand: Right    Extremity/Trunk Assessment   Upper Extremity Assessment: Overall WFL for tasks assessed           Lower Extremity Assessment: Overall WFL for tasks assessed      Cervical / Trunk Assessment: Kyphotic  Communication   Communication: No difficulties  Cognition Arousal/Alertness: Awake/alert Behavior During Therapy: WFL for tasks assessed/performed Overall Cognitive Status: Within Functional Limits for tasks assessed                      General Comments      Exercises        Assessment/Plan    PT Assessment Patent does not need any further PT services  PT Diagnosis Difficulty walking   PT Problem List    PT Treatment Interventions  PT Goals (Current goals can be found in the Care Plan section) Acute Rehab PT Goals Patient Stated Goal: go home today PT Goal Formulation: All assessment and education complete, DC therapy    Frequency     Barriers to discharge        Co-evaluation               End of Session   Activity Tolerance: Patient tolerated treatment well (SaO2 93% on RA) Patient left: in bed;with call bell/phone within reach           Time: 3846-6599 PT Time Calculation (min) (ACUTE ONLY): 10 min   Charges:   PT Evaluation $Initial PT Evaluation Tier I: 1 Procedure     PT G Codes:         Graysyn Bache 31-Jan-2015, 11:11 AM  Pager [phone]

## 2015-01-20 NOTE — Consult Note (Signed)
Reason for Consult: Dysphagia Referring Physician: Hospitalist  Brenda [name] is an 64 y.o. female.  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  She was admitted a few days ago due to fever, chills, and shortness of breath and was found to be hypotensive.  She was treated for pneumonia and sepsis and has improved.  She reported dysphagia that has been going on for a long time.  Regurgitation was witnessed by nursing.  She has intermittent difficulty swallowing solid food, sometimes requiring regurgitation but often managed with drinking fluids.  This problem has not changed recently.  She denies choking or coughing after swallowing.  An esophagram and neck CT were performed.  Past Medical History  Diagnosis Date  . History of DVT (deep vein thrombosis)   . Coronary artery disease     multiple stens  . COPD (chronic obstructive pulmonary disease)   . Transient ischemic attack   . Depression   . Hx of radiation therapy 01/16/09 - 03/06/09    base of tongue, right neck node  . Peripheral arterial disease     status post bilateral femoropopliteal bypass graft performed by Dr. Sherren Mocha early in the past with significant right lower extremity lifestyle limiting claudication  . Pneumonia   . Hypercholesteremia   . Chronic bronchitis   . Shortness of breath     "w/exertion and sometimes when laying down" (11/26/2012)  . Type II diabetes mellitus   . History of blood transfusion 1955  . GERD (gastroesophageal reflux disease)   . H/O hiatal hernia   . Stroke 2000    left side weakness remains (11/26/2012)  . Arthritis     "hands" (11/26/2012)  . 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 Myocardial infarction 2005    dr Ellyn Hack  with SE  . Fatigue 08/23/2013  . Carotid artery occlusion     Past Surgical History  Procedure Laterality Date  . Tee  without cardioversion  07/26/2011    Procedure: TRANSESOPHAGEAL ECHOCARDIOGRAM (TEE);  Surgeon: Pixie Casino, MD;  Location: [facility] ENDOSCOPY;  Service: Cardiovascular;  Laterality: N/A;  . Esophagogastroduodenoscopy  12/20/2011    Procedure: ESOPHAGOGASTRODUODENOSCOPY (EGD);  Surgeon: Beryle Beams, MD;  Location: [facility] ENDOSCOPY;  Service: Endoscopy;  Laterality: N/A;  EGD with balloon dilation  . Savory dilation  12/20/2011    Procedure: SAVORY DILATION;  Surgeon: Beryle Beams, MD;  Location: [facility] ENDOSCOPY;  Service: Endoscopy;  Laterality: N/A;  . Tonsillectomy  1969  . Appendectomy  1962  . Cardiac catheterization  1999; 11/26/2012  . Coronary angioplasty with stent placement  1999--2005    "i think I have 3-5 total" (11/26/2012)  . Femoral bypass  1999; 2000    Archie Endo 11/26/2012  . Carpal tunnel release Right 1980's?  . Vaginal hysterectomy  1988  . Cesarean section  1978  . Tubal ligation  1988  . Cataract extraction w/ intraocular lens implant Right 2011  . Ankle fracture surgery Right 2007    "got a metal plate and 8 screws in it" (11/26/2012)  . Bypass graft popliteal to popliteal Right 02/05/2013    Procedure: BYPASS GRAFT ABOVE KNEE POPLITEAL TO BELOW KNEE POPLITEAL WITH SMALL SAPHANOUS VEIN;  Surgeon: Rosetta Posner, MD;  Location: [place];  Service: Vascular;  Laterality: Right;  . Eye surgery  Family History  Problem Relation Age of Onset  . Cancer Mother     pancreatic  . Diabetes Mother   . Hyperlipidemia Mother   . Hypertension Mother   . Other Mother     varicose vein  . Cancer Father 72    throat  . Heart disease Father   . Heart attack Father   . Cancer Sister     breast  . Diabetes Sister   . Deep vein thrombosis Brother   . Diabetes Brother   . Hearing loss Brother   . Hyperlipidemia Brother   . Hypertension Brother   . Heart attack Brother   . Clotting disorder Brother   . Diabetes Son   . Hyperlipidemia Son     Social History:  reports that she has  been smoking Cigarettes.  She has a 50 pack-year smoking history. She has never used smokeless tobacco. She reports that she drinks about 6.0 oz of alcohol per week. She reports that she does not use illicit drugs.  Allergies:  Allergies  Allergen Reactions  . Tape Hives and Other (See Comments)    USE PAPER TAPE ONLY- Adhesive peels off skin-makes pt. Raw.  . Nicoderm [Nicotine] Rash    To the PATCH only, breakouts on skin    Medications: I have reviewed the patient's current medications.  Results for orders placed or performed during the hospital encounter of 01/17/15 (from the past 48 hour(s))  Glucose, capillary     Status: Abnormal   Collection Time: 01/18/15 12:57 PM  Result Value Ref Range   Glucose-Capillary 298 (H) 65 - 99 mg/dL  Glucose, capillary     Status: Abnormal   Collection Time: 01/18/15  5:21 PM  Result Value Ref Range   Glucose-Capillary 279 (H) 65 - 99 mg/dL  Glucose, capillary     Status: Abnormal   Collection Time: 01/18/15  7:52 PM  Result Value Ref Range   Glucose-Capillary 353 (H) 65 - 99 mg/dL  CBC     Status: Abnormal   Collection Time: 01/19/15  4:35 AM  Result Value Ref Range   WBC 6.0 4.0 - 10.5 K/uL   RBC 3.90 3.87 - 5.11 MIL/uL   Hemoglobin 11.9 (L) 12.0 - 15.0 g/dL   HCT 37.5 (L) 70.7 - 70.0 %   MCV 90.3 78.0 - 100.0 fL   MCH 30.5 26.0 - 34.0 pg   MCHC 33.8 30.0 - 36.0 g/dL   RDW 05.7 44.9 - 07.6 %   Platelets 248 150 - 400 K/uL  Basic metabolic panel     Status: Abnormal   Collection Time: 01/19/15  4:35 AM  Result Value Ref Range   Sodium 137 135 - 145 mmol/L    Comment: DELTA CHECK NOTED   Potassium 4.0 3.5 - 5.1 mmol/L   Chloride 101 101 - 111 mmol/L   CO2 29 22 - 32 mmol/L   Glucose, Bld 94 65 - 99 mg/dL   BUN 10 6 - 20 mg/dL   Creatinine, Ser 2.70 0.44 - 1.00 mg/dL   Calcium 8.8 (L) 8.9 - 10.3 mg/dL   GFR calc non Af Amer >60 >60 mL/min   GFR calc Af Amer >60 >60 mL/min    Comment: (NOTE) The eGFR has been calculated using  the CKD EPI equation. This calculation has not been validated in all clinical situations. eGFR's persistently <60 mL/min signify possible Chronic Kidney Disease.    Anion gap 7 5 - 15  Glucose, capillary  Status: None   Collection Time: 01/19/15  8:03 AM  Result Value Ref Range   Glucose-Capillary 67 65 - 99 mg/dL  Glucose, capillary     Status: Abnormal   Collection Time: 01/19/15  9:55 AM  Result Value Ref Range   Glucose-Capillary 199 (H) 65 - 99 mg/dL  Glucose, capillary     Status: Abnormal   Collection Time: 01/19/15 12:15 PM  Result Value Ref Range   Glucose-Capillary 206 (H) 65 - 99 mg/dL  TSH     Status: None   Collection Time: 01/19/15  3:15 PM  Result Value Ref Range   TSH 1.410 0.350 - 4.500 uIU/mL  Vitamin B12     Status: None   Collection Time: 01/19/15  3:15 PM  Result Value Ref Range   Vitamin B-12 535 180 - 914 pg/mL    Comment: (NOTE) This assay is not validated for testing neonatal or myeloproliferative syndrome specimens for Vitamin B12 levels.   Glucose, capillary     Status: Abnormal   Collection Time: 01/19/15  4:49 PM  Result Value Ref Range   Glucose-Capillary 264 (H) 65 - 99 mg/dL  Glucose, capillary     Status: Abnormal   Collection Time: 01/19/15 10:06 PM  Result Value Ref Range   Glucose-Capillary 241 (H) 65 - 99 mg/dL  Glucose, capillary     Status: None   Collection Time: 01/20/15  7:38 AM  Result Value Ref Range   Glucose-Capillary 69 65 - 99 mg/dL  Glucose, capillary     Status: Abnormal   Collection Time: 01/20/15  8:13 AM  Result Value Ref Range   Glucose-Capillary 101 (H) 65 - 99 mg/dL    Ct Soft Tissue Neck W Contrast  01/19/2015   CLINICAL DATA:  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 Dysphagia. Difficulty swallowing because of post treatment effect. Abnormal esophagram in the hypopharyngeal region. Evaluate for possible recurrent tumor.  EXAM: CT NECK WITH CONTRAST  TECHNIQUE:  Multidetector CT imaging of the neck was performed using the standard protocol following the bolus administration of intravenous contrast.  CONTRAST:  62mL OMNIPAQUE IOHEXOL 300 MG/ML  SOLN  COMPARISON:  None.  FINDINGS: Pharynx and larynx: No recurrent tumor or mucosal lesion is seen. Asymmetric enlargement LEFT cornu of the hyoid could contribute to effacement of the LEFT area epiglottic fold and LEFT hypopharyngeal asymmetry.  Salivary glands: Atrophic.  Thyroid: Unremarkable.  Lymph nodes: No pathologic adenopathy. Symmetric post radiation changes with stranding of the fat in the soft tissues of the neck.  Vascular: Moderately advanced calcification at the carotid bifurcations without definite flow reducing lesion.  Limited intracranial: Negative.  Visualized orbits: Negative.  BILATERAL cataract extraction.  Mastoids and visualized paranasal sinuses: No significant fluid accumulation or air-fluid level.  Skeleton: Spondylosis.  Osteopenia.  Upper chest: COPD.  No pulmonary nodules or effusion.  IMPRESSION: No recurrent tumor or mucosal lesion is seen. Asymmetric enlargement of the LEFT cornu of the hyoid bone could contribute to the observed hypopharyngeal effacement.  Post treatment changes as described.   Electronically Signed   By: Staci Righter M.D.   On: 01/19/2015 20:26   Dg Esophagus  01/18/2015   CLINICAL DATA:  Dysphagia. History of esophageal dilatation 2 years ago. History of head neck cancer. Tongue base carcinoma with prior radiation therapy.  EXAM: ESOPHOGRAM/BARIUM SWALLOW  TECHNIQUE: Combined double contrast and single contrast examination performed using effervescent crystals, thick barium liquid, and thin barium liquid.  FLUOROSCOPY TIME:  3 minutes and 54 seconds  COMPARISON:  Chest radiograph of 01/17/2015. Prior esophagram of 12/10/2011  FINDINGS: Hypopharyngeal portion of exam demonstrates deviation of the contrast column to the right, secondary to mass effect within the mid left neck.  Example image 65 of series 1. Subtle low less conspicuous findings identified on the prior exam. Lateral hypo pharyngeal evaluation demonstrates no aspiration or penetration.  Double contrast evaluation esophagus is limited, as patient had difficulty swallowing the effervescent crystals. No gross mucosal abnormality identified.  Evaluation of primary peristalsis demonstrates an incomplete primary peristaltic wave, with significant contrast stasis throughout the thoracic esophagus.  The patient could not be placed in an RAO position. A focused LPO positions single-contrast evaluation demonstrates no persistent narrowing or stricture. The patient had gagging episodes with multiple swallows, further limiting the exam.  A 13 mm barium tablet has delayed passage in the lower esophagus. This delayed persisted, even with subsequent barium swallows.  IMPRESSION: 1. Degraded exam, secondary to patient immobility and difficulty with multiple swallows. 2. Moderate esophageal dysmotility, likely presbyesophagus. 3. No definite esophageal stricture identified. 4. Delayed passage of 13 mm barium tablet in the lower esophagus, favored to be related to dysmotility. 5. Left-sided mass effect upon the hypo pharyngeal contrast column, with deviation to the right. This was subtly apparent back in 2013, but less well-defined on that exam. Although this could be radiation induced, recurrent disease cannot be excluded. Consider direct visualization.   Electronically Signed   By: Abigail Miyamoto M.D.   On: 01/18/2015 12:24    Review of Systems  Constitutional: Positive for fever, chills and malaise/fatigue.  All other systems reviewed and are negative.  Blood pressure 150/56, pulse 71, temperature 98.2 F (36.8 C), temperature source Oral, resp. rate 18, height $RemoveBe'5\' 5"'tAzQEojdk$  (1.651 m), weight 61.1 kg (134 lb 11.2 oz), SpO2 98 %. Physical Exam  Constitutional: She is oriented to person, place, and time. She appears well-developed and  well-nourished. No distress.  HENT:  Head: Normocephalic and atraumatic.  Right Ear: External ear normal.  Left Ear: External ear normal.  Nose: Nose normal.  Mouth/Throat: Oropharynx is clear and moist.  Eyes: Conjunctivae and EOM are normal. Pupils are equal, round, and reactive to light.  Neck: Normal range of motion. Neck supple.  Cardiovascular: Normal rate.   Respiratory: Effort normal.  Musculoskeletal: Normal range of motion.  Neurological: She is alert and oriented to person, place, and time. No cranial nerve deficit.  Skin: Skin is warm and dry.  Psychiatric: She has a normal mood and affect. Her behavior is normal. Judgment and thought content normal.    Assessment/Plan: Dysphagia, history of oropharynx cancer I personally reviewed her esophagram report and CT images.  There is no anatomical abnormality on CT corresponding to the esophagram finding.  No evidence of recurrence now over five years from treatment.  I do not see any need for laryngoscopy given the CT result.  Her dysphagia story certainly can fit with presbyesophagus found on esophagram.  We discussed careful swallowing with smaller bites and plenty of fluids.  Jamai Dolce 01/20/2015, 9:39 AM

## 2015-01-20 NOTE — Progress Notes (Signed)
Patient discharge teaching given, including activity, diet,, and medications. Patient wants care management consult regarding new primary physician before leaving. Care management RN notified.Patient verbalized understanding of all discharge instructions. IV access was d/c'd. Vitals are stable. Skin is intact except as charted in most recent assessments. Pt to be escorted out by NT, to be driven home by family.

## 2015-01-22 LAB — CULTURE, BLOOD (ROUTINE X 2)
Culture: NO GROWTH
Culture: NO GROWTH

## 2015-01-25 LAB — VITAMIN D 1,25 DIHYDROXY
Vitamin D 1, 25 (OH)2 Total: 23 pg/mL
Vitamin D2 1, 25 (OH)2: <10 pg/mL
Vitamin D3 1, 25 (OH)2: 23 pg/mL

## 2015-01-27 ENCOUNTER — Ambulatory Visit (HOSPITAL_COMMUNITY)
Admission: RE | Admit: 2015-01-27 | Discharge: 2015-01-27 | Disposition: A | Discharge status: Home / Self Care | Payer: Medicare Other | Source: Ambulatory Visit | Attending: Cardiology | Admitting: Cardiology

## 2015-01-27 ENCOUNTER — Other Ambulatory Visit (HOSPITAL_COMMUNITY): Payer: Self-pay | Admitting: Family Medicine

## 2015-01-27 DIAGNOSIS — E119 Type 2 diabetes mellitus without complications: Secondary | ICD-10-CM | POA: Diagnosis not present

## 2015-01-27 DIAGNOSIS — J189 Pneumonia, unspecified organism: Secondary | ICD-10-CM | POA: Diagnosis not present

## 2015-01-27 DIAGNOSIS — I739 Peripheral vascular disease, unspecified: Secondary | ICD-10-CM | POA: Diagnosis not present

## 2015-01-27 DIAGNOSIS — Z86718 Personal history of other venous thrombosis and embolism: Secondary | ICD-10-CM | POA: Diagnosis not present

## 2015-01-27 DIAGNOSIS — R600 Localized edema: Secondary | ICD-10-CM | POA: Insufficient documentation

## 2015-01-27 DIAGNOSIS — E785 Hyperlipidemia, unspecified: Secondary | ICD-10-CM | POA: Insufficient documentation

## 2015-01-27 DIAGNOSIS — Z09 Encounter for follow-up examination after completed treatment for conditions other than malignant neoplasm: Secondary | ICD-10-CM | POA: Diagnosis not present

## 2015-01-27 DIAGNOSIS — R079 Chest pain, unspecified: Secondary | ICD-10-CM | POA: Diagnosis not present

## 2015-01-27 DIAGNOSIS — F17200 Nicotine dependence, unspecified, uncomplicated: Secondary | ICD-10-CM | POA: Diagnosis not present

## 2015-01-30 ENCOUNTER — Other Ambulatory Visit: Payer: Self-pay

## 2015-01-30 ENCOUNTER — Encounter: Payer: Self-pay | Admitting: Pulmonary Disease

## 2015-01-30 ENCOUNTER — Ambulatory Visit (INDEPENDENT_AMBULATORY_CARE_PROVIDER_SITE_OTHER): Payer: Medicare Other | Admitting: Pulmonary Disease

## 2015-01-30 ENCOUNTER — Encounter: Payer: Self-pay | Admitting: Cardiology

## 2015-01-30 ENCOUNTER — Ambulatory Visit (INDEPENDENT_AMBULATORY_CARE_PROVIDER_SITE_OTHER)
Admission: RE | Admit: 2015-01-30 | Discharge: 2015-01-30 | Disposition: A | Discharge status: Home / Self Care | Payer: Medicare Other | Source: Ambulatory Visit | Attending: Pulmonary Disease | Admitting: Pulmonary Disease

## 2015-01-30 ENCOUNTER — Telehealth (HOSPITAL_COMMUNITY): Payer: Self-pay

## 2015-01-30 VITALS — BP 124/66 | HR 98 | Ht 66.0 in | Wt 128.0 lb

## 2015-01-30 DIAGNOSIS — Z720 Tobacco use: Secondary | ICD-10-CM

## 2015-01-30 DIAGNOSIS — J189 Pneumonia, unspecified organism: Secondary | ICD-10-CM | POA: Diagnosis not present

## 2015-01-30 DIAGNOSIS — F17210 Nicotine dependence, cigarettes, uncomplicated: Secondary | ICD-10-CM | POA: Diagnosis not present

## 2015-01-30 DIAGNOSIS — J984 Other disorders of lung: Secondary | ICD-10-CM | POA: Diagnosis not present

## 2015-01-30 DIAGNOSIS — J432 Centrilobular emphysema: Secondary | ICD-10-CM

## 2015-01-30 NOTE — Assessment & Plan Note (Signed)
She seems to be recovering from this well. We will check a chest x-ray today to ensure radiographic resolution.

## 2015-01-30 NOTE — Assessment & Plan Note (Signed)
She was counseled at length to quit smoking today.

## 2015-01-30 NOTE — Patient Instructions (Signed)
Follow the acid reflux lifestyle modification sheet we gave you Consider taking Pepcid or Zantac at night in addition to your Prevacid Keep taking the Breo and Spiriva Quit smoking We will see you back in 3 months or sooner if needed

## 2015-01-30 NOTE — Progress Notes (Signed)
Subjective:    Patient ID: Brenda [name], female    DOB: 1951/02/28, 64 y.o.   MRN: [id number]  Synopsis: Severe COPD, still actively smoking as of July 2016 Simple spirometry FEV1 June 2016 0.67 L (27% predicted) with clear airflow obstruction  HPI Chief Complaint  Patient presents with  . Follow-up    pt recently hospitalized for pna.  today pt c/o prod cough with yellow/green mucus.     Brenda [name] took the antibiotic we prescribed after the last visit but about three days after finishing the antibiotic she started feeling worse again with back pain and weakness.  She went to her PCP and was Rx'd with Keflex for ?bronchitis and a UTI?  She took this but her dypsnea worsened significantly and she ended up calling 911.  She was given a breathing treatment by EMS which ehlped a lot.  She was found in the ER to have CAP in the LLL.  She was treated with with prednisone and IV antibiotics.  She was discharged home on Levaquin.  She said that the prednisone wouldn't let her sleep much.  She is now feeling much better.  Her breathing is doing OK now.  She was started on Albuterol PRN which she is using from time to time.  No albuterol use in the last 3 days. She is confused about her inhalers and actually didn't use the Spiriva and Breo and actually forgot to take them for a few days, but then she started back a few days ago.  She is feeling better now. She is now smoking 12 cigarettes a day.  She is down from 1 and 1/2 packs per day. She has severe episode of acid reflux last night and has been coughing since then.   Past Medical History  Diagnosis Date  . History of DVT (deep vein thrombosis)   . Coronary artery disease     multiple stens  . COPD (chronic obstructive pulmonary disease)   . Transient ischemic attack   . Depression   . Hx of radiation therapy 01/16/09 - 03/06/09    base of tongue, right neck node  . Peripheral arterial disease     status post bilateral femoropopliteal  bypass graft performed by Dr. Sherren Mocha early in the past with significant right lower extremity lifestyle limiting claudication  . Pneumonia   . Hypercholesteremia   . Chronic bronchitis   . Shortness of breath     "w/exertion and sometimes when laying down" (11/26/2012)  . Type II diabetes mellitus   . History of blood transfusion 1955  . GERD (gastroesophageal reflux disease)   . H/O hiatal hernia   . Stroke 2000    left side weakness remains (11/26/2012)  . Arthritis     "hands" (11/26/2012)  . 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 Myocardial infarction 2005    dr Ellyn Hack  with SE  . Fatigue 08/23/2013  . Carotid artery occlusion       Review of Systems  Constitutional: Negative for fever, chills and fatigue.  HENT: Negative for postnasal drip, rhinorrhea and sinus pressure.   Respiratory: Negative for cough, shortness of breath and wheezing.   Cardiovascular: Negative for chest pain, palpitations and leg swelling.       Objective:   Physical Exam Filed Vitals:   01/30/15 1416  BP: 124/66  Pulse: 98  Height: '5\' 6"'$  (1.676 m)  Weight: 128 lb (58.06 kg)  SpO2: 95%   RA  Gen: well appearing, no acute distress HENT: NCAT, OP clear, neck supple without masses Eyes: PERRL, EOMi Lymph: no cervical lymphadenopathy PULM: wheezing, some rhonchi CV: RRR, no mgr, no JVD GI: BS+, soft, nontender, no hsm Derm: no rash or skin breakdown MSK: normal bulk and tone Neuro: A&Ox4, CN II-XII intact, strength 5/5 in all 4 extremities Psyche: normal mood and affect  August 2016 hospitalization discharge summary are reviewed where she was treated for community-acquired pneumonia, a flare of COPD, was evaluated for dysphagia. August 2016 chest x-ray images revealed emphysema and a very small infiltrate in the left lower lobe     Assessment & Plan:  COPD (chronic obstructive pulmonary disease) She had an exacerbation of her very severe COPD  requiring a hospitalization about a week ago. She is doing well now and is no longer wheezing on exam. However, she continues to smoke. She was educated at length today that she really needs to quit smoking. Her medication regimen is optimal. If she has another exacerbation we could consider adding something like Roflumilast.  Plan: Quit smoking Continue Breo and Spiriva   CAP (community acquired pneumonia) She seems to be recovering from this well. We will check a chest x-ray today to ensure radiographic resolution.  Tobacco abuse She was counseled at length to quit smoking today.     Current outpatient prescriptions:  .  acetaminophen (TYLENOL) 500 MG tablet, Take 1,000 mg by mouth every 6 (six) hours as needed for pain., Disp: , Rfl:  .  albuterol (PROVENTIL HFA;VENTOLIN HFA) 108 (90 BASE) MCG/ACT inhaler, Inhale 2 puffs into the lungs every 6 (six) hours as needed for wheezing or shortness of breath., Disp: 1 Inhaler, Rfl: 0 .  aspirin EC 81 MG tablet, Take 81 mg by mouth daily., Disp: , Rfl:  .  buPROPion (WELLBUTRIN XL) 150 MG 24 hr tablet, Take 1 tablet (150 mg total) by mouth daily., Disp: 30 tablet, Rfl: 0 .  clopidogrel (PLAVIX) 75 MG tablet, Take 75 mg by mouth daily. , Disp: , Rfl:  .  docusate sodium (COLACE) 100 MG capsule, Take 100 mg by mouth daily., Disp: , Rfl:  .  fish oil-omega-3 fatty acids 1000 MG capsule, Take 1 g by mouth 2 (two) times daily. , Disp: , Rfl:  .  Fluticasone Furoate-Vilanterol (BREO ELLIPTA IN), Inhale 1 puff into the lungs daily., Disp: , Rfl:  .  insulin glargine (LANTUS) 100 UNIT/ML injection, Inject 0.2 mLs (20 Units total) into the skin daily., Disp: 10 mL, Rfl: 11 .  Multiple Vitamins-Minerals (MULTIVITAMIN & MINERAL PO), Take 1 tablet by mouth daily., Disp: , Rfl:  .  nitroGLYCERIN (NITROLINGUAL) 0.4 MG/SPRAY spray, Place 1 spray under the tongue every 5 (five) minutes as needed for chest pain., Disp: 12 g, Rfl: 0 .  omeprazole (PRILOSEC) 20  MG capsule, Take 20 mg by mouth daily. , Disp: , Rfl:  .  rosuvastatin (CRESTOR) 20 MG tablet, Take 20 mg by mouth daily., Disp: , Rfl:  .  Tiotropium Bromide Monohydrate (SPIRIVA RESPIMAT) 2.5 MCG/ACT AERS, Inhale 2 puffs into the lungs daily., Disp: 1 Inhaler, Rfl: 11q

## 2015-01-30 NOTE — Telephone Encounter (Signed)
Called patient to discuss Pulmonary Rehab referral.  Patient states that she verified her insurance and she would have a $40 copay for each visit.  This is not affordable for her. Patient encouraged to contact us in the future if her insurance changes.

## 2015-01-30 NOTE — Assessment & Plan Note (Signed)
She had an exacerbation of her very severe COPD requiring a hospitalization about a week ago. She is doing well now and is no longer wheezing on exam. However, she continues to smoke. She was educated at length today that she really needs to quit smoking. Her medication regimen is optimal. If she has another exacerbation we could consider adding something like Roflumilast.  Plan: Quit smoking Continue Breo and Spiriva

## 2015-02-18 DIAGNOSIS — J440 Chronic obstructive pulmonary disease with acute lower respiratory infection: Secondary | ICD-10-CM | POA: Diagnosis not present

## 2015-02-21 DIAGNOSIS — Z961 Presence of intraocular lens: Secondary | ICD-10-CM | POA: Diagnosis not present

## 2015-02-21 DIAGNOSIS — H40013 Open angle with borderline findings, low risk, bilateral: Secondary | ICD-10-CM | POA: Diagnosis not present

## 2015-02-21 DIAGNOSIS — H04123 Dry eye syndrome of bilateral lacrimal glands: Secondary | ICD-10-CM | POA: Diagnosis not present

## 2015-02-21 DIAGNOSIS — E119 Type 2 diabetes mellitus without complications: Secondary | ICD-10-CM | POA: Diagnosis not present

## 2015-02-24 DIAGNOSIS — Z23 Encounter for immunization: Secondary | ICD-10-CM | POA: Diagnosis not present

## 2015-02-24 DIAGNOSIS — Z720 Tobacco use: Secondary | ICD-10-CM | POA: Diagnosis not present

## 2015-02-24 DIAGNOSIS — E119 Type 2 diabetes mellitus without complications: Secondary | ICD-10-CM | POA: Diagnosis not present

## 2015-02-24 DIAGNOSIS — I2510 Atherosclerotic heart disease of native coronary artery without angina pectoris: Secondary | ICD-10-CM | POA: Diagnosis not present

## 2015-02-24 DIAGNOSIS — J449 Chronic obstructive pulmonary disease, unspecified: Secondary | ICD-10-CM | POA: Diagnosis not present

## 2015-03-17 DIAGNOSIS — R05 Cough: Secondary | ICD-10-CM | POA: Diagnosis not present

## 2015-03-17 DIAGNOSIS — R509 Fever, unspecified: Secondary | ICD-10-CM | POA: Diagnosis not present

## 2015-03-17 DIAGNOSIS — J209 Acute bronchitis, unspecified: Secondary | ICD-10-CM | POA: Diagnosis not present

## 2015-03-20 DIAGNOSIS — J440 Chronic obstructive pulmonary disease with acute lower respiratory infection: Secondary | ICD-10-CM | POA: Diagnosis not present

## 2015-04-20 ENCOUNTER — Other Ambulatory Visit: Payer: Self-pay | Admitting: Family Medicine

## 2015-04-20 ENCOUNTER — Ambulatory Visit
Admission: RE | Admit: 2015-04-20 | Discharge: 2015-04-20 | Disposition: A | Discharge status: Home / Self Care | Payer: Medicare Other | Source: Ambulatory Visit | Attending: Family Medicine | Admitting: Family Medicine

## 2015-04-20 DIAGNOSIS — J441 Chronic obstructive pulmonary disease with (acute) exacerbation: Secondary | ICD-10-CM

## 2015-05-02 ENCOUNTER — Ambulatory Visit: Payer: Self-pay | Admitting: Pulmonary Disease

## 2015-05-18 ENCOUNTER — Encounter: Payer: Self-pay | Admitting: Pulmonary Disease

## 2015-05-18 ENCOUNTER — Ambulatory Visit (INDEPENDENT_AMBULATORY_CARE_PROVIDER_SITE_OTHER)
Admission: RE | Admit: 2015-05-18 | Discharge: 2015-05-18 | Disposition: A | Discharge status: Home / Self Care | Payer: Medicare Other | Source: Ambulatory Visit | Attending: Pulmonary Disease | Admitting: Pulmonary Disease

## 2015-05-18 ENCOUNTER — Ambulatory Visit (INDEPENDENT_AMBULATORY_CARE_PROVIDER_SITE_OTHER): Payer: Medicare Other | Admitting: Pulmonary Disease

## 2015-05-18 VITALS — BP 142/70 | HR 85 | Ht 66.0 in | Wt 128.0 lb

## 2015-05-18 DIAGNOSIS — J181 Lobar pneumonia, unspecified organism: Principal | ICD-10-CM

## 2015-05-18 DIAGNOSIS — B370 Candidal stomatitis: Secondary | ICD-10-CM | POA: Diagnosis not present

## 2015-05-18 DIAGNOSIS — J432 Centrilobular emphysema: Secondary | ICD-10-CM | POA: Diagnosis not present

## 2015-05-18 DIAGNOSIS — J189 Pneumonia, unspecified organism: Secondary | ICD-10-CM

## 2015-05-18 MED ORDER — FLUCONAZOLE 100 MG PO TABS: 100.0000 mg | ORAL_TABLET | Freq: Once | ORAL | Status: DC

## 2015-05-18 NOTE — Assessment & Plan Note (Signed)
She has very severe COPD so whenever she gets pneumonia she remains symptomatic. However, she is not wheezing and she is moving air well on exam today. She remains compliant with her medications.  Plan: Continue Breo and Spiriva Follow-up 3 months

## 2015-05-18 NOTE — Patient Instructions (Signed)
We will call you with the results of the chest x-ray Take the Diflucan to help with the thrush Make sure you rinse her mouth out after using the inhalers Keep using the albuterol nebulizer as you're doing Let us know if you have worsening shortness of breath, fatigue, or cough   For the sinus problem: Use Neil Med rinses with distilled water at least twice per day using the instructions on the package. 1/2 hour after using the [facility] Med rinse, use Nasacort two puffs in each nostril once per day.  Remember that the Nasacort can take 1-2 weeks to work after regular use. Use generic zyrtec (cetirizine) every day.  If this doesn't help, then stop taking it and use chlorpheniramine-phenylephrine combination tablets.  We will see you back in 3 months or sooner if needed

## 2015-05-18 NOTE — Assessment & Plan Note (Signed)
She has thrush which is likely due to the recent antibiotics and her inhaler use.  Plan: Diflucan 1 dose I have encouraged her to rinse her mouth more frequently after using the inhalers

## 2015-05-18 NOTE — Assessment & Plan Note (Signed)
Unfortunately she has had another episode of pneumonia this year. It seems that the lingular infiltrate nearly completely resolved after the summertime episode of pneumonia but there is still a bit present on the November chest x-ray images which I have personally reviewed. In addition of this there is a new left upper lobe infiltrate. Clinically she has improved and is proceeding with the expected course for somebody who had community-acquired pneumonia. In that, her shortness of breath has improved but she still having some fatigue which is completely expected for pneumonia. I believe she was treated appropriately by her primary care physician.  Plan: Check a chest x-ray today to ensure that both the lingular in the left upper lobe infiltrate have resolved If the infiltrates are still present then she will need to have a CT scan and potentially a bronchoscopy I have asked her to clarify her pneumonia vaccine records from her primary care doctor's office.

## 2015-05-18 NOTE — Progress Notes (Signed)
Subjective:    Patient ID: Brenda [name], female    DOB: [birth year], 63 y.o.   MRN: [id number]  Synopsis: Severe COPD, still actively smoking as of July 2016 Simple spirometry FEV1 June 2016 0.67 L (27% predicted) with clear airflow obstruction  HPI Chief Complaint  Patient presents with  . Follow-up    pt states she was dx'ed again with PNA in Nov. Pt c/o prod cough worse qhs with yellow mucus.     Brenda [name] had pneumonia again in November and was prescribed Levaquin '750mg'$  po daily for 10 days for pneumonia.  Her daughter gave her some breathing treatments at home which helped.   This was a month ago.  She still hasn't bounced back to normal.  She has been feeling a little more energetic in the mornings, but by the afternoon she has more sinus congestion, worsening cough, and fatigue.  She continues to struggle with shortness of breath.  She has been using her inhalers and they help when she needs them.  She has a chill every now and then, but none recent.  She will feel febrile but she hasn't measured an elevated temperature.  No leg pain or swelling.  She is still taking Spiriva and Breo.  She uses the nebulizer every 3-4 days at this point.   She has been told that she needs to have another Chest X-ray.    Past Medical History  Diagnosis Date  . History of DVT (deep vein thrombosis)   . Coronary artery disease     multiple stens  . COPD (chronic obstructive pulmonary disease) ([place])   . Transient ischemic attack   . Depression   . Hx of radiation therapy 01/16/09 - 03/06/09    base of tongue, right neck node  . Peripheral arterial disease ([name])     status post bilateral femoropopliteal bypass graft performed by Dr. Sherren Mocha early in the past with significant right lower extremity lifestyle limiting claudication  . Pneumonia   . Hypercholesteremia   . Chronic bronchitis ([place])   . Shortness of breath     "w/exertion and sometimes when laying down" (11/26/2012)  . Type II diabetes  mellitus ([place])   . History of blood transfusion 1955  . GERD (gastroesophageal reflux disease)   . H/O hiatal hernia   . Stroke [facility]) 2000    left side weakness remains (11/26/2012)  . Arthritis     "hands" (11/26/2012)  . Cancer of base of tongue ([place]) 11/04/2008    "& lymph nodes @ right neck" (11/26/2012  . Myocardial infarction [facility]) 2005    dr Ellyn Hack  with [facility]  . Fatigue 08/23/2013  . Carotid artery occlusion       Review of Systems  Constitutional: Positive for fatigue. Negative for fever and chills.  HENT: Positive for postnasal drip, rhinorrhea and sinus pressure.   Respiratory: Positive for cough and shortness of breath. Negative for wheezing.   Cardiovascular: Negative for chest pain, palpitations and leg swelling.       Objective:   Physical Exam Filed Vitals:   05/18/15 1011  BP: 142/70  Pulse: 85  Height: '5\' 6"'$  (1.676 m)  Weight: 128 lb (58.06 kg)  SpO2: 98%   RA  Gen: chronically ill appearing, kyphosis HENT: OP clear, TM's clear, neck supple PULM: CTA B, normal percussion CV: RRR, no mgr, trace edema GI: BS+, soft, nontender Derm: no cyanosis or rash Psyche: normal mood and affect  CBC    Component Value Date/Time  WBC 6.0 01/19/2015 0435   WBC 5.1 08/21/2012 1256   RBC 3.90 01/19/2015 0435   RBC 4.85 08/21/2012 1256   HGB 11.9* 01/19/2015 0435   HGB 15.6 08/21/2012 1256   HCT 35.2* 01/19/2015 0435   HCT 44.6 08/21/2012 1256   PLT 248 01/19/2015 0435   PLT 192 08/21/2012 1256   MCV 90.3 01/19/2015 0435   MCV 92.0 08/21/2012 1256   MCH 30.5 01/19/2015 0435   MCH 32.2 08/21/2012 1256   MCHC 33.8 01/19/2015 0435   MCHC 34.9 08/21/2012 1256   RDW 13.5 01/19/2015 0435   RDW 13.8 08/21/2012 1256   LYMPHSABS 0.4* 01/17/2015 1536   LYMPHSABS 0.6* 08/21/2012 1256   MONOABS 0.5 01/17/2015 1536   MONOABS 0.3 08/21/2012 1256   EOSABS 0.0 01/17/2015 1536   EOSABS 0.0 08/21/2012 1256   BASOSABS 0.0 01/17/2015 1536   BASOSABS 0.1 08/21/2012 1256       04/2015 CXR images personally reviewed showing a ?lingular infiltraate and LUL infiltrate (small, mild).  The lingula changes are less conspicuous on this images.    Assessment & Plan:  CAP (community acquired pneumonia) Unfortunately she has had another episode of pneumonia this year. It seems that the lingular infiltrate nearly completely resolved after the summertime episode of pneumonia but there is still a bit present on the November chest x-ray images which I have personally reviewed. In addition of this there is a new left upper lobe infiltrate. Clinically she has improved and is proceeding with the expected course for somebody who had community-acquired pneumonia. In that, her shortness of breath has improved but she still having some fatigue which is completely expected for pneumonia. I believe she was treated appropriately by her primary care physician.  Plan: Check a chest x-ray today to ensure that both the lingular in the left upper lobe infiltrate have resolved If the infiltrates are still present then she will need to have a CT scan and potentially a bronchoscopy I have asked her to clarify her pneumonia vaccine records from her primary care doctor's office.  COPD (chronic obstructive pulmonary disease) She has very severe COPD so whenever she gets pneumonia she remains symptomatic. However, she is not wheezing and she is moving air well on exam today. She remains compliant with her medications.  Plan: Continue Breo and Spiriva Follow-up 3 months  Thrush She has thrush which is likely due to the recent antibiotics and her inhaler use.  Plan: Diflucan 1 dose I have encouraged her to rinse her mouth more frequently after using the inhalers     Current outpatient prescriptions:  .  acetaminophen (TYLENOL) 500 MG tablet, Take 1,000 mg by mouth every 6 (six) hours as needed for pain., Disp: , Rfl:  .  albuterol (PROVENTIL HFA;VENTOLIN HFA) 108 (90 BASE) MCG/ACT  inhaler, Inhale 2 puffs into the lungs every 6 (six) hours as needed for wheezing or shortness of breath., Disp: 1 Inhaler, Rfl: 0 .  aspirin EC 81 MG tablet, Take 81 mg by mouth daily., Disp: , Rfl:  .  buPROPion (WELLBUTRIN XL) 150 MG 24 hr tablet, Take 1 tablet (150 mg total) by mouth daily., Disp: 30 tablet, Rfl: 0 .  clopidogrel (PLAVIX) 75 MG tablet, Take 75 mg by mouth daily. , Disp: , Rfl:  .  docusate sodium (COLACE) 100 MG capsule, Take 100 mg by mouth daily., Disp: , Rfl:  .  fish oil-omega-3 fatty acids 1000 MG capsule, Take 1 g by mouth 2 (two) times daily. ,  Disp: , Rfl:  .  Fluticasone Furoate-Vilanterol (BREO ELLIPTA IN), Inhale 1 puff into the lungs daily., Disp: , Rfl:  .  insulin glargine (LANTUS) 100 UNIT/ML injection, Inject 0.2 mLs (20 Units total) into the skin daily., Disp: 10 mL, Rfl: 11 .  insulin lispro (HUMALOG) 100 UNIT/ML injection, Inject 3 Units into the skin 3 (three) times daily before meals., Disp: , Rfl:  .  Multiple Vitamins-Minerals (MULTIVITAMIN & MINERAL PO), Take 1 tablet by mouth daily., Disp: , Rfl:  .  nitroGLYCERIN (NITROLINGUAL) 0.4 MG/SPRAY spray, Place 1 spray under the tongue every 5 (five) minutes as needed for chest pain., Disp: 12 g, Rfl: 0 .  omeprazole (PRILOSEC) 20 MG capsule, Take 20 mg by mouth daily. , Disp: , Rfl:  .  rosuvastatin (CRESTOR) 20 MG tablet, Take 20 mg by mouth daily., Disp: , Rfl:  .  Tiotropium Bromide Monohydrate (SPIRIVA RESPIMAT) 2.5 MCG/ACT AERS, Inhale 2 puffs into the lungs daily., Disp: 1 Inhaler, Rfl: 11 .  fluconazole (DIFLUCAN) 100 MG tablet, Take 1 tablet (100 mg total) by mouth once., Disp: 1 tablet, Rfl: 0q

## 2015-05-22 ENCOUNTER — Telehealth: Payer: Self-pay | Admitting: Pulmonary Disease

## 2015-05-22 NOTE — Telephone Encounter (Signed)
Called pt and informed of cxr results Nothing further is needed at this time

## 2015-05-22 NOTE — Telephone Encounter (Signed)
Called and spoke with pt Pt requesting results of cxr that was done 05/18/15 Informed pt that BQ has not finalized results yet but when he does the office would call her back Pt would like call back by the end of the day  Dr Lake Bells, please advise. Thanks

## 2015-05-22 NOTE — Telephone Encounter (Signed)
The pneumonia has cleared up, but the radiologist is worried about some scarring.  I would like for her to have a repeat CXR on the next visit.

## 2015-05-28 ENCOUNTER — Inpatient Hospital Stay (HOSPITAL_COMMUNITY)
Admission: EM | Admit: 2015-05-28 | Discharge: 2015-06-02 | DRG: 871 | Disposition: A | Discharge status: Home Health | Payer: Medicare Other | Attending: Internal Medicine | Admitting: Internal Medicine

## 2015-05-28 ENCOUNTER — Encounter (HOSPITAL_COMMUNITY): Payer: Self-pay | Admitting: *Deleted

## 2015-05-28 ENCOUNTER — Emergency Department (HOSPITAL_COMMUNITY): Payer: Medicare Other

## 2015-05-28 DIAGNOSIS — Z720 Tobacco use: Secondary | ICD-10-CM | POA: Diagnosis present

## 2015-05-28 DIAGNOSIS — E1151 Type 2 diabetes mellitus with diabetic peripheral angiopathy without gangrene: Secondary | ICD-10-CM | POA: Diagnosis present

## 2015-05-28 DIAGNOSIS — E785 Hyperlipidemia, unspecified: Secondary | ICD-10-CM | POA: Diagnosis present

## 2015-05-28 DIAGNOSIS — Z955 Presence of coronary angioplasty implant and graft: Secondary | ICD-10-CM | POA: Diagnosis not present

## 2015-05-28 DIAGNOSIS — Z7982 Long term (current) use of aspirin: Secondary | ICD-10-CM | POA: Diagnosis not present

## 2015-05-28 DIAGNOSIS — K219 Gastro-esophageal reflux disease without esophagitis: Secondary | ICD-10-CM | POA: Diagnosis present

## 2015-05-28 DIAGNOSIS — J9621 Acute and chronic respiratory failure with hypoxia: Secondary | ICD-10-CM | POA: Diagnosis present

## 2015-05-28 DIAGNOSIS — I252 Old myocardial infarction: Secondary | ICD-10-CM

## 2015-05-28 DIAGNOSIS — E860 Dehydration: Secondary | ICD-10-CM | POA: Diagnosis present

## 2015-05-28 DIAGNOSIS — R64 Cachexia: Secondary | ICD-10-CM | POA: Diagnosis present

## 2015-05-28 DIAGNOSIS — I69354 Hemiplegia and hemiparesis following cerebral infarction affecting left non-dominant side: Secondary | ICD-10-CM | POA: Diagnosis not present

## 2015-05-28 DIAGNOSIS — Z794 Long term (current) use of insulin: Secondary | ICD-10-CM | POA: Diagnosis not present

## 2015-05-28 DIAGNOSIS — E1100 Type 2 diabetes mellitus with hyperosmolarity without nonketotic hyperglycemic-hyperosmolar coma (NKHHC): Secondary | ICD-10-CM | POA: Diagnosis not present

## 2015-05-28 DIAGNOSIS — Z6820 Body mass index (BMI) 20.0-20.9, adult: Secondary | ICD-10-CM | POA: Diagnosis not present

## 2015-05-28 DIAGNOSIS — Z7902 Long term (current) use of antithrombotics/antiplatelets: Secondary | ICD-10-CM

## 2015-05-28 DIAGNOSIS — Z86718 Personal history of other venous thrombosis and embolism: Secondary | ICD-10-CM | POA: Diagnosis not present

## 2015-05-28 DIAGNOSIS — F17210 Nicotine dependence, cigarettes, uncomplicated: Secondary | ICD-10-CM | POA: Diagnosis present

## 2015-05-28 DIAGNOSIS — I739 Peripheral vascular disease, unspecified: Secondary | ICD-10-CM | POA: Diagnosis present

## 2015-05-28 DIAGNOSIS — Z8673 Personal history of transient ischemic attack (TIA), and cerebral infarction without residual deficits: Secondary | ICD-10-CM

## 2015-05-28 DIAGNOSIS — E162 Hypoglycemia, unspecified: Secondary | ICD-10-CM | POA: Diagnosis not present

## 2015-05-28 DIAGNOSIS — Z515 Encounter for palliative care: Secondary | ICD-10-CM | POA: Diagnosis not present

## 2015-05-28 DIAGNOSIS — E11649 Type 2 diabetes mellitus with hypoglycemia without coma: Secondary | ICD-10-CM | POA: Diagnosis present

## 2015-05-28 DIAGNOSIS — J440 Chronic obstructive pulmonary disease with acute lower respiratory infection: Secondary | ICD-10-CM | POA: Diagnosis present

## 2015-05-28 DIAGNOSIS — A403 Sepsis due to Streptococcus pneumoniae: Secondary | ICD-10-CM | POA: Diagnosis not present

## 2015-05-28 DIAGNOSIS — Z8701 Personal history of pneumonia (recurrent): Secondary | ICD-10-CM

## 2015-05-28 DIAGNOSIS — E1165 Type 2 diabetes mellitus with hyperglycemia: Secondary | ICD-10-CM | POA: Diagnosis present

## 2015-05-28 DIAGNOSIS — Z888 Allergy status to other drugs, medicaments and biological substances status: Secondary | ICD-10-CM | POA: Diagnosis not present

## 2015-05-28 DIAGNOSIS — I2510 Atherosclerotic heart disease of native coronary artery without angina pectoris: Secondary | ICD-10-CM | POA: Diagnosis present

## 2015-05-28 DIAGNOSIS — F329 Major depressive disorder, single episode, unspecified: Secondary | ICD-10-CM | POA: Diagnosis present

## 2015-05-28 DIAGNOSIS — Z79899 Other long term (current) drug therapy: Secondary | ICD-10-CM | POA: Diagnosis not present

## 2015-05-28 DIAGNOSIS — Z9981 Dependence on supplemental oxygen: Secondary | ICD-10-CM | POA: Diagnosis not present

## 2015-05-28 DIAGNOSIS — N179 Acute kidney failure, unspecified: Secondary | ICD-10-CM | POA: Diagnosis present

## 2015-05-28 DIAGNOSIS — D899 Disorder involving the immune mechanism, unspecified: Secondary | ICD-10-CM | POA: Diagnosis present

## 2015-05-28 DIAGNOSIS — K224 Dyskinesia of esophagus: Secondary | ICD-10-CM | POA: Diagnosis present

## 2015-05-28 DIAGNOSIS — A419 Sepsis, unspecified organism: Secondary | ICD-10-CM | POA: Diagnosis present

## 2015-05-28 DIAGNOSIS — J690 Pneumonitis due to inhalation of food and vomit: Secondary | ICD-10-CM | POA: Diagnosis present

## 2015-05-28 DIAGNOSIS — J154 Pneumonia due to other streptococci: Secondary | ICD-10-CM | POA: Diagnosis present

## 2015-05-28 DIAGNOSIS — IMO0002 Reserved for concepts with insufficient information to code with codable children: Secondary | ICD-10-CM | POA: Diagnosis present

## 2015-05-28 DIAGNOSIS — E7800 Pure hypercholesterolemia, unspecified: Secondary | ICD-10-CM | POA: Diagnosis present

## 2015-05-28 DIAGNOSIS — Z85810 Personal history of malignant neoplasm of tongue: Secondary | ICD-10-CM

## 2015-05-28 DIAGNOSIS — J441 Chronic obstructive pulmonary disease with (acute) exacerbation: Secondary | ICD-10-CM | POA: Diagnosis present

## 2015-05-28 DIAGNOSIS — D72829 Elevated white blood cell count, unspecified: Secondary | ICD-10-CM

## 2015-05-28 DIAGNOSIS — R1310 Dysphagia, unspecified: Secondary | ICD-10-CM | POA: Diagnosis present

## 2015-05-28 DIAGNOSIS — J189 Pneumonia, unspecified organism: Secondary | ICD-10-CM | POA: Diagnosis present

## 2015-05-28 DIAGNOSIS — E871 Hypo-osmolality and hyponatremia: Secondary | ICD-10-CM | POA: Diagnosis present

## 2015-05-28 LAB — COMPREHENSIVE METABOLIC PANEL
ALT: 14 U/L (ref 14–54)
AST: 16 U/L (ref 15–41)
Albumin: 2.3 g/dL — ABNORMAL LOW (ref 3.5–5.0)
Alkaline Phosphatase: 187 U/L — ABNORMAL HIGH (ref 38–126)
Anion gap: 8 (ref 5–15)
BUN: 18 mg/dL (ref 6–20)
CO2: 30 mmol/L (ref 22–32)
Calcium: 8.7 mg/dL — ABNORMAL LOW (ref 8.9–10.3)
Chloride: 93 mmol/L — ABNORMAL LOW (ref 101–111)
Creatinine, Ser: 1.05 mg/dL — ABNORMAL HIGH (ref 0.44–1.00)
GFR calc Af Amer: >60 mL/min (ref >60)
GFR calc non Af Amer: 55 mL/min — ABNORMAL LOW (ref >60)
Glucose, Bld: 296 mg/dL — ABNORMAL HIGH (ref 65–99)
Potassium: 4.5 mmol/L (ref 3.5–5.1)
Sodium: 131 mmol/L — ABNORMAL LOW (ref 135–145)
Total Bilirubin: 1.2 mg/dL (ref 0.3–1.2)
Total Protein: 5.7 g/dL — ABNORMAL LOW (ref 6.5–8.1)

## 2015-05-28 LAB — URINALYSIS, ROUTINE W REFLEX MICROSCOPIC
Glucose, UA: >1000 mg/dL — AB
Hgb urine dipstick: NEGATIVE
Ketones, ur: NEGATIVE mg/dL
Leukocytes, UA: NEGATIVE
Nitrite: NEGATIVE
Protein, ur: 30 mg/dL — AB
Specific Gravity, Urine: 1.021 (ref 1.005–1.030)
pH: 5.5 (ref 5.0–8.0)

## 2015-05-28 LAB — CBC WITH DIFFERENTIAL/PLATELET
Basophils Absolute: 0 10*3/uL (ref 0.0–0.1)
Basophils Relative: 0 %
Eosinophils Absolute: 0 10*3/uL (ref 0.0–0.7)
Eosinophils Relative: 0 %
HCT: 41.6 % (ref 36.0–46.0)
Hemoglobin: 13.8 g/dL (ref 12.0–15.0)
Lymphocytes Relative: 3 %
Lymphs Abs: 0.5 10*3/uL — ABNORMAL LOW (ref 0.7–4.0)
MCH: 30.5 pg (ref 26.0–34.0)
MCHC: 33.2 g/dL (ref 30.0–36.0)
MCV: 91.8 fL (ref 78.0–100.0)
Monocytes Absolute: 0.4 10*3/uL (ref 0.1–1.0)
Monocytes Relative: 2 %
Neutro Abs: 19.2 10*3/uL — ABNORMAL HIGH (ref 1.7–7.7)
Neutrophils Relative %: 95 %
Platelets: 274 10*3/uL (ref 150–400)
RBC: 4.53 MIL/uL (ref 3.87–5.11)
RDW: 14.6 % (ref 11.5–15.5)
WBC: 20.2 10*3/uL — ABNORMAL HIGH (ref 4.0–10.5)

## 2015-05-28 LAB — URINE MICROSCOPIC-ADD ON

## 2015-05-28 LAB — PROCALCITONIN: Procalcitonin: 1.98 ng/mL

## 2015-05-28 LAB — MRSA PCR SCREENING: MRSA BY PCR: NEGATIVE

## 2015-05-28 LAB — INFLUENZA PANEL BY PCR (TYPE A & B)
H1N1 flu by pcr: NOT DETECTED
INFLAPCR: NEGATIVE
Influenza B By PCR: NEGATIVE

## 2015-05-28 LAB — GLUCOSE, CAPILLARY
GLUCOSE-CAPILLARY: 169 mg/dL — AB (ref 65–99)
Glucose-Capillary: 207 mg/dL — ABNORMAL HIGH (ref 65–99)

## 2015-05-28 LAB — MAGNESIUM: MAGNESIUM: 1.7 mg/dL (ref 1.7–2.4)

## 2015-05-28 LAB — STREP PNEUMONIAE URINARY ANTIGEN: Strep Pneumo Urinary Antigen: POSITIVE — AB

## 2015-05-28 LAB — I-STAT CG4 LACTIC ACID, ED: LACTIC ACID, VENOUS: 0.95 mmol/L (ref 0.5–2.0)

## 2015-05-28 MED ORDER — OMEGA-3 FATTY ACIDS 1000 MG PO CAPS
1.0000 g | ORAL_CAPSULE | Freq: Two times a day (BID) | ORAL | Status: DC
  Administered 2015-05-28: 1 g via ORAL
  Filled 2015-05-28 (×4): qty 1

## 2015-05-28 MED ORDER — ONDANSETRON HCL 4 MG/2ML IJ SOLN: 4.0000 mg | Freq: Four times a day (QID) | INTRAMUSCULAR | Status: DC | PRN

## 2015-05-28 MED ORDER — INSULIN ASPART 100 UNIT/ML ~~LOC~~ SOLN
0.0000 [IU] | Freq: Every day | SUBCUTANEOUS | Status: DC
  Administered 2015-05-28 – 2015-06-01 (×3): 2 [IU] via SUBCUTANEOUS

## 2015-05-28 MED ORDER — DEXTROSE 5 % IV SOLN
1.0000 g | INTRAVENOUS | Status: DC
  Administered 2015-05-28: 1 g via INTRAVENOUS
  Filled 2015-05-28 (×2): qty 1

## 2015-05-28 MED ORDER — INSULIN ASPART 100 UNIT/ML ~~LOC~~ SOLN
5.0000 [IU] | Freq: Once | SUBCUTANEOUS | Status: AC
  Administered 2015-05-28: 5 [IU] via SUBCUTANEOUS
  Filled 2015-05-28: qty 1

## 2015-05-28 MED ORDER — ARFORMOTEROL TARTRATE 15 MCG/2ML IN NEBU
15.0000 ug | INHALATION_SOLUTION | Freq: Two times a day (BID) | RESPIRATORY_TRACT | Status: DC
  Administered 2015-05-28 – 2015-06-02 (×10): 15 ug via RESPIRATORY_TRACT
  Filled 2015-05-28 (×13): qty 2

## 2015-05-28 MED ORDER — VANCOMYCIN HCL IN DEXTROSE 1-5 GM/200ML-% IV SOLN
1000.0000 mg | Freq: Once | INTRAVENOUS | Status: AC
  Administered 2015-05-28: 1000 mg via INTRAVENOUS
  Filled 2015-05-28: qty 200

## 2015-05-28 MED ORDER — OSELTAMIVIR PHOSPHATE 75 MG PO CAPS
75.0000 mg | ORAL_CAPSULE | Freq: Two times a day (BID) | ORAL | Status: DC
  Administered 2015-05-28 – 2015-05-29 (×2): 75 mg via ORAL
  Filled 2015-05-28 (×4): qty 1

## 2015-05-28 MED ORDER — VANCOMYCIN HCL 500 MG IV SOLR
500.0000 mg | Freq: Two times a day (BID) | INTRAVENOUS | Status: DC
  Administered 2015-05-29: 500 mg via INTRAVENOUS
  Filled 2015-05-28 (×2): qty 500

## 2015-05-28 MED ORDER — LORATADINE 10 MG PO TABS
10.0000 mg | ORAL_TABLET | Freq: Every day | ORAL | Status: DC
  Administered 2015-05-28 – 2015-06-02 (×6): 10 mg via ORAL
  Filled 2015-05-28 (×6): qty 1

## 2015-05-28 MED ORDER — DEXTROSE 5 % IV SOLN: 500.0000 mg | INTRAVENOUS | Status: DC

## 2015-05-28 MED ORDER — DEXTROSE 5 % IV SOLN: 1.0000 g | INTRAVENOUS | Status: DC

## 2015-05-28 MED ORDER — IPRATROPIUM-ALBUTEROL 0.5-2.5 (3) MG/3ML IN SOLN
3.0000 mL | Freq: Four times a day (QID) | RESPIRATORY_TRACT | Status: DC
  Administered 2015-05-28 – 2015-06-02 (×17): 3 mL via RESPIRATORY_TRACT
  Filled 2015-05-28 (×19): qty 3

## 2015-05-28 MED ORDER — POLYETHYLENE GLYCOL 3350 17 G PO PACK
17.0000 g | PACK | Freq: Every day | ORAL | Status: DC
  Administered 2015-05-28 – 2015-06-02 (×6): 17 g via ORAL
  Filled 2015-05-28 (×6): qty 1

## 2015-05-28 MED ORDER — BUPROPION HCL ER (XL) 150 MG PO TB24
150.0000 mg | ORAL_TABLET | Freq: Every day | ORAL | Status: DC
Start: 2015-05-29 — End: 2015-06-02
  Administered 2015-05-29 – 2015-06-02 (×5): 150 mg via ORAL
  Filled 2015-05-28 (×5): qty 1

## 2015-05-28 MED ORDER — INSULIN ASPART 100 UNIT/ML ~~LOC~~ SOLN
3.0000 [IU] | Freq: Three times a day (TID) | SUBCUTANEOUS | Status: DC
  Administered 2015-05-29 – 2015-06-02 (×10): 3 [IU] via SUBCUTANEOUS

## 2015-05-28 MED ORDER — INSULIN LISPRO 100 UNIT/ML ~~LOC~~ SOLN: 3.0000 [IU] | Freq: Three times a day (TID) | SUBCUTANEOUS | Status: DC | PRN

## 2015-05-28 MED ORDER — ENOXAPARIN SODIUM 40 MG/0.4ML ~~LOC~~ SOLN
40.0000 mg | SUBCUTANEOUS | Status: DC
  Administered 2015-05-28 – 2015-06-01 (×5): 40 mg via SUBCUTANEOUS
  Filled 2015-05-28 (×5): qty 0.4

## 2015-05-28 MED ORDER — GUAIFENESIN ER 600 MG PO TB12
1200.0000 mg | ORAL_TABLET | Freq: Two times a day (BID) | ORAL | Status: DC
  Administered 2015-05-28 – 2015-06-02 (×10): 1200 mg via ORAL
  Filled 2015-05-28 (×11): qty 2

## 2015-05-28 MED ORDER — SODIUM CHLORIDE 0.9 % IV SOLN
INTRAVENOUS | Status: AC
  Administered 2015-05-28: 18:00:00 via INTRAVENOUS

## 2015-05-28 MED ORDER — IPRATROPIUM-ALBUTEROL 0.5-2.5 (3) MG/3ML IN SOLN
3.0000 mL | Freq: Once | RESPIRATORY_TRACT | Status: AC
  Administered 2015-05-28: 3 mL via RESPIRATORY_TRACT
  Filled 2015-05-28: qty 3

## 2015-05-28 MED ORDER — SODIUM CHLORIDE 0.9 % IV BOLUS (SEPSIS)
1000.0000 mL | INTRAVENOUS | Status: AC
  Administered 2015-05-28 (×2): 1000 mL via INTRAVENOUS

## 2015-05-28 MED ORDER — CLOPIDOGREL BISULFATE 75 MG PO TABS
75.0000 mg | ORAL_TABLET | Freq: Every day | ORAL | Status: DC
  Administered 2015-05-28 – 2015-06-02 (×6): 75 mg via ORAL
  Filled 2015-05-28 (×6): qty 1

## 2015-05-28 MED ORDER — IPRATROPIUM-ALBUTEROL 0.5-2.5 (3) MG/3ML IN SOLN
3.0000 mL | RESPIRATORY_TRACT | Status: DC | PRN
  Administered 2015-05-29: 3 mL via RESPIRATORY_TRACT
  Filled 2015-05-28 (×2): qty 3

## 2015-05-28 MED ORDER — NITROGLYCERIN 0.4 MG/SPRAY TL SOLN: 1.0000 | Status: DC | PRN

## 2015-05-28 MED ORDER — ENSURE ENLIVE PO LIQD
237.0000 mL | Freq: Two times a day (BID) | ORAL | Status: DC
  Administered 2015-05-29 – 2015-06-02 (×3): 237 mL via ORAL

## 2015-05-28 MED ORDER — BUDESONIDE 0.25 MG/2ML IN SUSP
0.2500 mg | Freq: Two times a day (BID) | RESPIRATORY_TRACT | Status: DC
  Administered 2015-05-28 – 2015-05-29 (×2): 0.25 mg via RESPIRATORY_TRACT
  Filled 2015-05-28 (×4): qty 2

## 2015-05-28 MED ORDER — ACETAMINOPHEN 325 MG PO TABS: 650.0000 mg | ORAL_TABLET | ORAL | Status: DC | PRN

## 2015-05-28 MED ORDER — INSULIN ASPART 100 UNIT/ML ~~LOC~~ SOLN
0.0000 [IU] | Freq: Three times a day (TID) | SUBCUTANEOUS | Status: DC
  Administered 2015-05-28: 4 [IU] via SUBCUTANEOUS
  Administered 2015-05-29: 7 [IU] via SUBCUTANEOUS
  Administered 2015-05-29: 15 [IU] via SUBCUTANEOUS
  Administered 2015-05-29: 4 [IU] via SUBCUTANEOUS
  Administered 2015-05-30: 15 [IU] via SUBCUTANEOUS
  Administered 2015-05-30: 11 [IU] via SUBCUTANEOUS
  Administered 2015-05-30: 4 [IU] via SUBCUTANEOUS
  Administered 2015-05-31: 11 [IU] via SUBCUTANEOUS
  Administered 2015-05-31: 4 [IU] via SUBCUTANEOUS
  Administered 2015-06-01: 11 [IU] via SUBCUTANEOUS
  Administered 2015-06-01: 4 [IU] via SUBCUTANEOUS

## 2015-05-28 MED ORDER — ROSUVASTATIN CALCIUM 20 MG PO TABS
20.0000 mg | ORAL_TABLET | Freq: Every day | ORAL | Status: DC
  Administered 2015-05-28 – 2015-06-01 (×5): 20 mg via ORAL
  Filled 2015-05-28 (×5): qty 1

## 2015-05-28 MED ORDER — DEXTROSE 5 % IV SOLN
500.0000 mg | Freq: Once | INTRAVENOUS | Status: AC
  Administered 2015-05-28: 500 mg via INTRAVENOUS
  Filled 2015-05-28: qty 500

## 2015-05-28 MED ORDER — OMEGA-3-ACID ETHYL ESTERS 1 G PO CAPS
1.0000 g | ORAL_CAPSULE | Freq: Two times a day (BID) | ORAL | Status: DC
  Administered 2015-05-28 – 2015-06-02 (×10): 1 g via ORAL
  Filled 2015-05-28 (×9): qty 1

## 2015-05-28 MED ORDER — PANTOPRAZOLE SODIUM 40 MG PO TBEC
40.0000 mg | DELAYED_RELEASE_TABLET | Freq: Every day | ORAL | Status: DC
  Administered 2015-05-29 – 2015-06-02 (×5): 40 mg via ORAL
  Filled 2015-05-28 (×5): qty 1

## 2015-05-28 MED ORDER — DEXTROSE 5 % IV SOLN
1.0000 g | Freq: Once | INTRAVENOUS | Status: AC
  Administered 2015-05-28: 1 g via INTRAVENOUS
  Filled 2015-05-28: qty 10

## 2015-05-28 MED ORDER — ASPIRIN EC 81 MG PO TBEC
81.0000 mg | DELAYED_RELEASE_TABLET | Freq: Every day | ORAL | Status: DC
  Administered 2015-05-28 – 2015-06-02 (×6): 81 mg via ORAL
  Filled 2015-05-28 (×6): qty 1

## 2015-05-28 MED ORDER — INSULIN GLARGINE 100 UNIT/ML ~~LOC~~ SOLN
20.0000 [IU] | Freq: Two times a day (BID) | SUBCUTANEOUS | Status: DC
  Administered 2015-05-28 – 2015-05-31 (×6): 20 [IU] via SUBCUTANEOUS
  Filled 2015-05-28 (×7): qty 0.2

## 2015-05-28 MED ORDER — METHYLPREDNISOLONE SODIUM SUCC 125 MG IJ SOLR
125.0000 mg | Freq: Four times a day (QID) | INTRAMUSCULAR | Status: DC
  Administered 2015-05-28 – 2015-05-29 (×3): 125 mg via INTRAVENOUS
  Filled 2015-05-28 (×3): qty 2

## 2015-05-28 NOTE — Progress Notes (Signed)
ANTIBIOTIC CONSULT NOTE - INITIAL  Pharmacy Consult for vancomycin + cefepime Indication: rule out pneumonia  Allergies  Allergen Reactions  . Tape Hives and Other (See Comments)    USE PAPER TAPE ONLY- Adhesive peels off skin-makes pt. Raw.  . Nicoderm [Nicotine] Rash    To the PATCH only, breakouts on skin    Patient Measurements: Height: '5\' 6"'$  (167.6 cm) Weight: 128 lb (58.06 kg) IBW/kg (Calculated) : 59.3 Adjusted Body Weight:   Vital Signs: Temp: 98.7 F (37.1 C) (12/25 1526) Temp Source: Oral (12/25 1526) BP: 118/56 mmHg (12/25 1526) Pulse Rate: 92 (12/25 1526) Intake/Output from previous day:   Intake/Output from this shift: Total I/O In: 2300 [IV Piggyback:2300] Out: -   Labs:  Recent Labs  05/28/15 1156  WBC 20.2*  HGB 13.8  PLT 274  CREATININE 1.05*   Estimated Creatinine Clearance: 49.6 mL/min (by C-G formula based on Cr of 1.05). No results for input(s): VANCOTROUGH, VANCOPEAK, VANCORANDOM, GENTTROUGH, GENTPEAK, GENTRANDOM, TOBRATROUGH, TOBRAPEAK, TOBRARND, AMIKACINPEAK, AMIKACINTROU, AMIKACIN in the last 72 hours.   Microbiology: No results found for this or any previous visit (from the past 720 hour(s)).  Medical History: Past Medical History  Diagnosis Date  . History of DVT (deep vein thrombosis)   . Coronary artery disease     multiple stens  . COPD (chronic obstructive pulmonary disease) ([place])   . Transient ischemic attack   . Depression   . Hx of radiation therapy 01/16/09 - 03/06/09    base of tongue, right neck node  . Peripheral arterial disease ([name])     status post bilateral femoropopliteal bypass graft performed by Dr. Sherren Mocha early in the past with significant right lower extremity lifestyle limiting claudication  . Pneumonia   . Hypercholesteremia   . Chronic bronchitis ([place])   . Shortness of breath     "w/exertion and sometimes when laying down" (11/26/2012)  . Type II diabetes mellitus ([place])   . History of blood transfusion  1955  . GERD (gastroesophageal reflux disease)   . H/O hiatal hernia   . Stroke [facility]) 2000    left side weakness remains (11/26/2012)  . Arthritis     "hands" (11/26/2012)  . Cancer of base of tongue ([place]) 11/04/2008    "& lymph nodes @ right neck" (11/26/2012  . Myocardial infarction [facility]) 2005    dr Ellyn Hack  with [facility]  . Fatigue 08/23/2013  . Carotid artery occlusion     Medications:  Anti-infectives    Start     Dose/Rate Route Frequency Ordered Stop   05/29/15 1230  cefTRIAXone (ROCEPHIN) 1 g in dextrose 5 % 50 mL IVPB  Status:  Discontinued     1 g 100 mL/hr over 30 Minutes Intravenous Every 24 hours 05/28/15 1237 05/28/15 1529   05/29/15 1230  azithromycin (ZITHROMAX) 500 mg in dextrose 5 % 250 mL IVPB     500 mg 250 mL/hr over 60 Minutes Intravenous Every 24 hours 05/28/15 1237     05/29/15 0430  vancomycin (VANCOCIN) 500 mg in sodium chloride 0.9 % 100 mL IVPB     500 mg 100 mL/hr over 60 Minutes Intravenous Every 12 hours 05/28/15 1536     05/28/15 1630  vancomycin (VANCOCIN) IVPB 1000 mg/200 mL premix     1,000 mg 200 mL/hr over 60 Minutes Intravenous  Once 05/28/15 1536     05/28/15 1630  ceFEPIme (MAXIPIME) 1 g in dextrose 5 % 50 mL IVPB     1 g  100 mL/hr over 30 Minutes Intravenous Every 24 hours 05/28/15 1536     05/28/15 1530  azithromycin (ZITHROMAX) 500 mg in dextrose 5 % 250 mL IVPB     500 mg 250 mL/hr over 60 Minutes Intravenous Every 24 hours 05/28/15 1529     05/28/15 1530  oseltamivir (TAMIFLU) capsule 75 mg     75 mg Oral 2 times daily 05/28/15 1529 06/02/15 0959   05/28/15 1230  cefTRIAXone (ROCEPHIN) 1 g in dextrose 5 % 50 mL IVPB     1 g 100 mL/hr over 30 Minutes Intravenous  Once 05/28/15 1229 05/28/15 1320   05/28/15 1230  azithromycin (ZITHROMAX) 500 mg in dextrose 5 % 250 mL IVPB     500 mg 250 mL/hr over 60 Minutes Intravenous  Once 05/28/15 1229 05/28/15 1412     Assessment: 53 yof presented to the hospital with worsening SOB. To start empiric  broad-spectrum antibiotics with vancomycin, cefepime and azithromycin. Pt is afebrile and WBC is elevated at 20.2. Scr is 1.05 and lactic acid is <1.   Vanc 12/25>> Cefepime 12/25>> Azithro 12/25>>  Goal of Therapy:  Vancomycin trough level 15-20 mcg/ml  Plan:  - Vancomycin 1gm IV x 1 then '500mg'$  IV Q12H - Cefepime 1gm IV Q24H - F/u renal fxn, C&S, clinical status and trough at [place], Rande Lawman 05/28/2015,3:37 PM

## 2015-05-28 NOTE — ED Notes (Signed)
Pt with fevers and cough for 1 week.  Hx of COPD and has been tx multiple x's for pnx the last few months.  Sats of 74% RA in triage and 83% on 4L.

## 2015-05-28 NOTE — ED Provider Notes (Signed)
CSN: [id number]     Arrival date & time 05/28/15  1116 History   First MD Initiated Contact with Patient 05/28/15 1212     Chief Complaint  Patient presents with  . Fever  . Cough     (Consider location/radiation/quality/duration/timing/severity/associated sxs/prior Treatment) HPI Complains of fever cough and shortness of breath onset 1 week ago. Patient reports she had temperature 101 seven days ago. Cough is nonproductive. Dyspnea is worse with exertion and improved with rest.. She's treated herself with albuterol inhaler and home nebulizer and with prednisone from an old prescription with partial relief. She's also increased her home oxygen from 3 L to 4 L with partial relief. No other associated symptoms Past Medical History  Diagnosis Date  . History of DVT (deep vein thrombosis)   . Coronary artery disease     multiple stens  . COPD (chronic obstructive pulmonary disease) ([place])   . Transient ischemic attack   . Depression   . Hx of radiation therapy 01/16/09 - 03/06/09    base of tongue, right neck node  . Peripheral arterial disease ([name])     status post bilateral femoropopliteal bypass graft performed by Dr. Sherren Mocha early in the past with significant right lower extremity lifestyle limiting claudication  . Pneumonia   . Hypercholesteremia   . Chronic bronchitis ([place])   . Shortness of breath     "w/exertion and sometimes when laying down" (11/26/2012)  . Type II diabetes mellitus ([place])   . History of blood transfusion 1955  . GERD (gastroesophageal reflux disease)   . H/O hiatal hernia   . Stroke [facility]) 2000    left side weakness remains (11/26/2012)  . Arthritis     "hands" (11/26/2012)  . Cancer of base of tongue ([place]) 11/04/2008    "& lymph nodes @ right neck" (11/26/2012  . Myocardial infarction [facility]) 2005    dr Ellyn Hack  with SE  . Fatigue 08/23/2013  . Carotid artery occlusion    Past Surgical History  Procedure Laterality Date  . Tee without cardioversion  07/26/2011     Procedure: TRANSESOPHAGEAL ECHOCARDIOGRAM (TEE);  Surgeon: Pixie Casino, MD;  Location: [facility] ENDOSCOPY;  Service: Cardiovascular;  Laterality: N/A;  . Esophagogastroduodenoscopy  12/20/2011    Procedure: ESOPHAGOGASTRODUODENOSCOPY (EGD);  Surgeon: Beryle Beams, MD;  Location: [facility] ENDOSCOPY;  Service: Endoscopy;  Laterality: N/A;  EGD with balloon dilation  . Savory dilation  12/20/2011    Procedure: SAVORY DILATION;  Surgeon: Beryle Beams, MD;  Location: [facility] ENDOSCOPY;  Service: Endoscopy;  Laterality: N/A;  . Tonsillectomy  1969  . Appendectomy  1962  . Cardiac catheterization  1999; 11/26/2012  . Coronary angioplasty with stent placement  1999--2005    "i think I have 3-5 total" (11/26/2012)  . Femoral bypass  1999; 2000    Archie Endo 11/26/2012  . Carpal tunnel release Right 1980's?  . Vaginal hysterectomy  1988  . Cesarean section  1978  . Tubal ligation  1988  . Cataract extraction w/ intraocular lens implant Right 2011  . Ankle fracture surgery Right 2007    "got a metal plate and 8 screws in it" (11/26/2012)  . Bypass graft popliteal to popliteal Right 02/05/2013    Procedure: BYPASS GRAFT ABOVE KNEE POPLITEAL TO BELOW KNEE POPLITEAL WITH SMALL SAPHANOUS VEIN;  Surgeon: Rosetta Posner, MD;  Location: [facility] OR;  Service: Vascular;  Laterality: Right;  . Eye surgery     Family History  Problem Relation Age of Onset  .  Cancer Mother     pancreatic  . Diabetes Mother   . Hyperlipidemia Mother   . Hypertension Mother   . Other Mother     varicose vein  . Cancer Father 72    throat  . Heart disease Father   . Heart attack Father   . Cancer Sister     breast  . Diabetes Sister   . Deep vein thrombosis Brother   . Diabetes Brother   . Hearing loss Brother   . Hyperlipidemia Brother   . Hypertension Brother   . Heart attack Brother   . Clotting disorder Brother   . Diabetes Son   . Hyperlipidemia Son    Social History  Substance Use Topics  . Smoking status: Current Every Day  Smoker -- 1.00 packs/day for 50 years    Types: Cigarettes  . Smokeless tobacco: Never Used     Comment: down to .5ppd per pt 01/30/2015  . Alcohol Use: 6.0 oz/week    10 Glasses of wine per week   OB History    No data available     Review of Systems  Constitutional: Positive for fever.  HENT: Negative.   Respiratory: Positive for cough and shortness of breath.   Cardiovascular: Negative.   Gastrointestinal: Negative.   Musculoskeletal: Negative.   Skin: Negative.   Allergic/Immunologic: Positive for immunocompromised state.       Diabetic  Neurological: Negative.   Psychiatric/Behavioral: Negative.   All other systems reviewed and are negative.     Allergies  Tape and Nicoderm  Home Medications   Prior to Admission medications   Medication Sig Start Date End Date Taking? Authorizing Provider  acetaminophen (TYLENOL) 500 MG tablet Take 1,000 mg by mouth every 6 (six) hours as needed for pain.    Historical Provider, MD  albuterol (PROVENTIL HFA;VENTOLIN HFA) 108 (90 BASE) MCG/ACT inhaler Inhale 2 puffs into the lungs every 6 (six) hours as needed for wheezing or shortness of breath. 01/20/15   Belkys A Regalado, MD  aspirin EC 81 MG tablet Take 81 mg by mouth daily.    Historical Provider, MD  buPROPion (WELLBUTRIN XL) 150 MG 24 hr tablet Take 1 tablet (150 mg total) by mouth daily. 08/18/14   Allie Bossier, MD  clopidogrel (PLAVIX) 75 MG tablet Take 75 mg by mouth daily.     Historical Provider, MD  docusate sodium (COLACE) 100 MG capsule Take 100 mg by mouth daily.    Historical Provider, MD  fish oil-omega-3 fatty acids 1000 MG capsule Take 1 g by mouth 2 (two) times daily.     Historical Provider, MD  fluconazole (DIFLUCAN) 100 MG tablet Take 1 tablet (100 mg total) by mouth once. 05/18/15   Juanito Doom, MD  Fluticasone Furoate-Vilanterol (BREO ELLIPTA IN) Inhale 1 puff into the lungs daily.    Historical Provider, MD  insulin glargine (LANTUS) 100 UNIT/ML  injection Inject 0.2 mLs (20 Units total) into the skin daily. 01/20/15   Belkys A Regalado, MD  insulin lispro (HUMALOG) 100 UNIT/ML injection Inject 3 Units into the skin 3 (three) times daily before meals.    Historical Provider, MD  Multiple Vitamins-Minerals (MULTIVITAMIN & MINERAL PO) Take 1 tablet by mouth daily.    Historical Provider, MD  nitroGLYCERIN (NITROLINGUAL) 0.4 MG/SPRAY spray Place 1 spray under the tongue every 5 (five) minutes as needed for chest pain. 01/20/15   Belkys A Regalado, MD  omeprazole (PRILOSEC) 20 MG capsule Take 20 mg by  mouth daily.     Historical Provider, MD  rosuvastatin (CRESTOR) 20 MG tablet Take 20 mg by mouth daily.    Historical Provider, MD  Tiotropium Bromide Monohydrate (SPIRIVA RESPIMAT) 2.5 MCG/ACT AERS Inhale 2 puffs into the lungs daily. 12/19/14   Juanito Doom, MD   BP 109/57 mmHg  Pulse 106  Temp(Src) 98 F (36.7 C) (Oral)  Resp 20  Ht '5\' 6"'$  (1.676 m)  Wt 128 lb (58.06 kg)  BMI 20.67 kg/m2  SpO2 88% Physical Exam  Constitutional:  Chronically ill-appearing, cachectic  HENT:  Head: Normocephalic and atraumatic.  Mucous membranes dry  Eyes: Conjunctivae are normal. Pupils are equal, round, and reactive to light.  Neck: Neck supple. No tracheal deviation present. No thyromegaly present.  Cardiovascular: Normal rate and regular rhythm.   No murmur heard. Pulmonary/Chest: Effort normal.  Diffuse coarse rhonchi and expiratory wheezes. Speaks in paragraphs  Abdominal: Soft. Bowel sounds are normal. She exhibits no distension. There is no tenderness.  Musculoskeletal: Normal range of motion. She exhibits no edema or tenderness.  Neurological: She is alert. Coordination normal.  Skin: Skin is warm and dry. No rash noted.  Psychiatric: She has a normal mood and affect.  Nursing note and vitals reviewed.   ED Course  Procedures (including critical care time) Labs Review Labs Reviewed  CBC WITH DIFFERENTIAL/PLATELET - Abnormal;  Notable for the following:    WBC 20.2 (*)    Neutro Abs 19.2 (*)    Lymphs Abs 0.5 (*)    All other components within normal limits  COMPREHENSIVE METABOLIC PANEL  I-STAT CG4 LACTIC ACID, ED    Imaging Review Dg Chest Portable 1 View  05/28/2015  CLINICAL DATA:  64 year old female with chest tightness and shortness of breath as well as fever EXAM: PORTABLE CHEST 1 VIEW COMPARISON:  Prior chest x-ray 05/18/2015 FINDINGS: Patchy airspace opacities in both lower lobes. The cardiac and mediastinal contours are within normal limits. Trace atherosclerotic calcifications present in the transverse aorta. A background of chronic interstitial prominence and bronchitic changes similar compared to prior. No definite pleural effusion or pneumothorax. No acute osseous abnormality. IMPRESSION: Interval development of patchy airspace opacities in both lung bases concerning for possible multi lobar pneumonia. Electronically Signed   By: Jacqulynn Cadet M.D.   On: 05/28/2015 12:02   I have personally reviewed and evaluated these images and lab results as part of my medical decision-making.   EKG Interpretation None     Chest x-ray viewed by me  128 pm feels improved after nebulizer treatment and treatment with iv antiobiotics and iv fluids Results for orders placed or performed during the hospital encounter of 05/28/15  Comprehensive metabolic panel  Result Value Ref Range   Sodium 131 (L) 135 - 145 mmol/L   Potassium 4.5 3.5 - 5.1 mmol/L   Chloride 93 (L) 101 - 111 mmol/L   CO2 30 22 - 32 mmol/L   Glucose, Bld 296 (H) 65 - 99 mg/dL   BUN 18 6 - 20 mg/dL   Creatinine, Ser 1.05 (H) 0.44 - 1.00 mg/dL   Calcium 8.7 (L) 8.9 - 10.3 mg/dL   Total Protein 5.7 (L) 6.5 - 8.1 g/dL   Albumin 2.3 (L) 3.5 - 5.0 g/dL   AST 16 15 - 41 U/L   ALT 14 14 - 54 U/L   Alkaline Phosphatase 187 (H) 38 - 126 U/L   Total Bilirubin 1.2 0.3 - 1.2 mg/dL   GFR calc non Af Amer 55 (  L) >60 mL/min   GFR calc Af Amer >60  >60 mL/min   Anion gap 8 5 - 15  CBC with Differential  Result Value Ref Range   WBC 20.2 (H) 4.0 - 10.5 K/uL   RBC 4.53 3.87 - 5.11 MIL/uL   Hemoglobin 13.8 12.0 - 15.0 g/dL   HCT 41.6 36.0 - 46.0 %   MCV 91.8 78.0 - 100.0 fL   MCH 30.5 26.0 - 34.0 pg   MCHC 33.2 30.0 - 36.0 g/dL   RDW 14.6 11.5 - 15.5 %   Platelets 274 150 - 400 K/uL   Neutrophils Relative % 95 %   Neutro Abs 19.2 (H) 1.7 - 7.7 K/uL   Lymphocytes Relative 3 %   Lymphs Abs 0.5 (L) 0.7 - 4.0 K/uL   Monocytes Relative 2 %   Monocytes Absolute 0.4 0.1 - 1.0 K/uL   Eosinophils Relative 0 %   Eosinophils Absolute 0.0 0.0 - 0.7 K/uL   Basophils Relative 0 %   Basophils Absolute 0.0 0.0 - 0.1 K/uL   Dg Chest 2 View  05/18/2015  CLINICAL DATA:  Cough.  Shortness of breath. EXAM: CHEST  2 VIEW COMPARISON:  04/20/2015. FINDINGS: Mediastinum hilar structures are normal. Mild bilateral pulmonary interstitial prominence noted. Slight increase from prior exam. A component of the interstitial prominence may be related to chronic interstitial lung disease. A mild component of pneumonitis cannot be excluded. No focal infiltrate. No pleural effusion or pneumothorax. Heart size normal. Degenerative changes thoracic spine. IMPRESSION: Mild bilateral pulmonary interstitial prominence. Slight increase in interstitial prominence from prior exam. A component of the interstitial prominence may be chronic. Mild pneumonitis cannot be excluded. Electronically Signed   By: Marcello Moores  Register   On: 05/18/2015 14:30   Dg Chest Portable 1 View  05/28/2015  CLINICAL DATA:  64 year old female with chest tightness and shortness of breath as well as fever EXAM: PORTABLE CHEST 1 VIEW COMPARISON:  Prior chest x-ray 05/18/2015 FINDINGS: Patchy airspace opacities in both lower lobes. The cardiac and mediastinal contours are within normal limits. Trace atherosclerotic calcifications present in the transverse aorta. A background of chronic interstitial  prominence and bronchitic changes similar compared to prior. No definite pleural effusion or pneumothorax. No acute osseous abnormality. IMPRESSION: Interval development of patchy airspace opacities in both lung bases concerning for possible multi lobar pneumonia. Electronically Signed   By: Jacqulynn Cadet M.D.   On: 05/28/2015 12:02   Results for orders placed or performed during the hospital encounter of 05/28/15  Comprehensive metabolic panel  Result Value Ref Range   Sodium 131 (L) 135 - 145 mmol/L   Potassium 4.5 3.5 - 5.1 mmol/L   Chloride 93 (L) 101 - 111 mmol/L   CO2 30 22 - 32 mmol/L   Glucose, Bld 296 (H) 65 - 99 mg/dL   BUN 18 6 - 20 mg/dL   Creatinine, Ser 1.05 (H) 0.44 - 1.00 mg/dL   Calcium 8.7 (L) 8.9 - 10.3 mg/dL   Total Protein 5.7 (L) 6.5 - 8.1 g/dL   Albumin 2.3 (L) 3.5 - 5.0 g/dL   AST 16 15 - 41 U/L   ALT 14 14 - 54 U/L   Alkaline Phosphatase 187 (H) 38 - 126 U/L   Total Bilirubin 1.2 0.3 - 1.2 mg/dL   GFR calc non Af Amer 55 (L) >60 mL/min   GFR calc Af Amer >60 >60 mL/min   Anion gap 8 5 - 15  CBC with Differential  Result  Value Ref Range   WBC 20.2 (H) 4.0 - 10.5 K/uL   RBC 4.53 3.87 - 5.11 MIL/uL   Hemoglobin 13.8 12.0 - 15.0 g/dL   HCT 41.6 36.0 - 46.0 %   MCV 91.8 78.0 - 100.0 fL   MCH 30.5 26.0 - 34.0 pg   MCHC 33.2 30.0 - 36.0 g/dL   RDW 14.6 11.5 - 15.5 %   Platelets 274 150 - 400 K/uL   Neutrophils Relative % 95 %   Neutro Abs 19.2 (H) 1.7 - 7.7 K/uL   Lymphocytes Relative 3 %   Lymphs Abs 0.5 (L) 0.7 - 4.0 K/uL   Monocytes Relative 2 %   Monocytes Absolute 0.4 0.1 - 1.0 K/uL   Eosinophils Relative 0 %   Eosinophils Absolute 0.0 0.0 - 0.7 K/uL   Basophils Relative 0 %   Basophils Absolute 0.0 0.0 - 0.1 K/uL  I-Stat CG4 Lactic Acid, ED  (not at [facility])  Result Value Ref Range   Lactic Acid, Venous 0.95 0.5 - 2.0 mmol/L   Dg Chest 2 View  05/18/2015  CLINICAL DATA:  Cough.  Shortness of breath. EXAM: CHEST  2 VIEW COMPARISON:  04/20/2015.  FINDINGS: Mediastinum hilar structures are normal. Mild bilateral pulmonary interstitial prominence noted. Slight increase from prior exam. A component of the interstitial prominence may be related to chronic interstitial lung disease. A mild component of pneumonitis cannot be excluded. No focal infiltrate. No pleural effusion or pneumothorax. Heart size normal. Degenerative changes thoracic spine. IMPRESSION: Mild bilateral pulmonary interstitial prominence. Slight increase in interstitial prominence from prior exam. A component of the interstitial prominence may be chronic. Mild pneumonitis cannot be excluded. Electronically Signed   By: Marcello Moores  Register   On: 05/18/2015 14:30   Dg Chest Portable 1 View  05/28/2015  CLINICAL DATA:  64 year old female with chest tightness and shortness of breath as well as fever EXAM: PORTABLE CHEST 1 VIEW COMPARISON:  Prior chest x-ray 05/18/2015 FINDINGS: Patchy airspace opacities in both lower lobes. The cardiac and mediastinal contours are within normal limits. Trace atherosclerotic calcifications present in the transverse aorta. A background of chronic interstitial prominence and bronchitic changes similar compared to prior. No definite pleural effusion or pneumothorax. No acute osseous abnormality. IMPRESSION: Interval development of patchy airspace opacities in both lung bases concerning for possible multi lobar pneumonia. Electronically Signed   By: Jacqulynn Cadet M.D.   On: 05/28/2015 12:02    MDM  Code sepsis called based on tachycardia ,, leukocytosis, source being respiratory Final diagnoses:  None   palliative care consult by me called as patient has recurrent pneumonia and COPD.Dr Grandville Silos consulted and will arrange for admission to telemetry. Insulin ordered to treat hyperglycemia Dx#1 sepsis #2 community acquired pneumonia #3hyperglycemia #4 tobacco abuse     Orlie Dakin, MD 05/28/15 1358

## 2015-05-28 NOTE — H&P (Signed)
[facility] History and Physical  Brenda [name]:[id number] DOB: 10-08-1950 DOA: 05/28/2015  Referring physician: Dr. Cathleen Fears PCP: Brenda [name]   Chief Complaint: Worsening shortness of breath  HPI: Brenda [name] is a 64 y.o. female  With history of coronary artery disease status post multiple stents, COPD on chronic home O2 3 L nasal cannula, depression, history of CVA with residual left-sided weakness, carotid artery occlusion, gastroesophageal reflux disease, type 2 diabetes bolus had recurrent pneumonias approximately 3 over the past year (08/19/2014, 01/14/2015, 04/20/2015 per patient) who presents to the ED with a one-week history of fevers with temperatures as high as 101, worsening shortness of breath on exertion requiring increased oxygen requirements, wheezing, upper respiratory symptoms of rhinorrhea chest congestion increasing mucus, chills, myalgias, cough or productive yellowish sputum and some rib pain with coughing. She stated she tried saline solution and rhinorrhea that worse. Patient endorses some nausea and chronic constipation. Patient denies any emesis, no chest pain, no abdominal pain, no diarrhea, no dysuria, no melena, no hematemesis, no hematochezia. Patient was seen in the emergency room and on presentation noted to have a heart rate in the low 100s, sats of 88%, a leukocytosis with a white count of 20.2, comprehensive metabolic profile with a sodium of 131 chloride of 93 glucose of 296 phosphatase of 187 otherwise was unremarkable. Chest x-ray was concerning for interval development of patchy S patient opacities in both lung bases concerning for possible multilobar pneumonia. Patient was pancultured and placed empirically on IV Rocephin and azithromycin to far cover for community-acquired pneumonia. [facility] were called to admit the patient for further evaluation and management.   Review of Systems: As per history of present illness  otherwise negative. Constitutional:  No weight loss, night sweats, Fevers, chills, fatigue.  HEENT:  No headaches, Difficulty swallowing,Tooth/dental problems,Sore throat,  No sneezing, itching, ear ache, nasal congestion, post nasal drip,  Cardio-vascular:  No chest pain, Orthopnea, PND, swelling in lower extremities, anasarca, dizziness, palpitations  GI:  No heartburn, indigestion, abdominal pain, nausea, vomiting, diarrhea, change in bowel habits, loss of appetite  Resp:  No shortness of breath with exertion or at rest. No excess mucus, no productive cough, No non-productive cough, No coughing up of blood.No change in color of mucus.No wheezing.No chest wall deformity  Skin:  no rash or lesions.  GU:  no dysuria, change in color of urine, no urgency or frequency. No flank pain.  Musculoskeletal:  No joint pain or swelling. No decreased range of motion. No back pain.  Psych:  No change in mood or affect. No depression or anxiety. No memory loss.   Past Medical History  Diagnosis Date  . History of DVT (deep vein thrombosis)   . Coronary artery disease     multiple stens  . COPD (chronic obstructive pulmonary disease) ([place])   . Transient ischemic attack   . Depression   . Hx of radiation therapy 01/16/09 - 03/06/09    base of tongue, right neck node  . Peripheral arterial disease ([name])     status post bilateral femoropopliteal bypass graft performed by Brenda. Sherren [name] early in the past with significant right lower extremity lifestyle limiting claudication  . Pneumonia   . Hypercholesteremia   . Chronic bronchitis ([place])   . Shortness of breath     "w/exertion and sometimes when laying down" (11/26/2012)  . Type II diabetes mellitus ([place])   . History of blood transfusion 1955  . GERD (gastroesophageal reflux disease)   .  H/O hiatal hernia   . Stroke [facility]) 2000    left side weakness remains (11/26/2012)  . Arthritis     "hands" (11/26/2012)  . Cancer of base of tongue ([place]) 11/04/2008      "& lymph nodes @ right neck" (11/26/2012  . Myocardial infarction Brenda [name]) 2005    Brenda [name]  with SE  . Fatigue 08/23/2013  . Carotid artery occlusion    Past Surgical History  Procedure Laterality Date  . Tee without cardioversion  07/26/2011    Procedure: TRANSESOPHAGEAL ECHOCARDIOGRAM (TEE);  Surgeon: Brenda [name];  Location: [facility] ENDOSCOPY;  Service: Cardiovascular;  Laterality: N/A;  . Esophagogastroduodenoscopy  12/20/2011    Procedure: ESOPHAGOGASTRODUODENOSCOPY (EGD);  Surgeon: Brenda [name];  Location: [facility] ENDOSCOPY;  Service: Endoscopy;  Laterality: N/A;  EGD with balloon dilation  . Savory dilation  12/20/2011    Procedure: SAVORY DILATION;  Surgeon: Brenda [name];  Location: [facility] ENDOSCOPY;  Service: Endoscopy;  Laterality: N/A;  . Tonsillectomy  1969  . Appendectomy  1962  . Cardiac catheterization  1999; 11/26/2012  . Coronary angioplasty with stent placement  1999--2005    "i think I have 3-5 total" (11/26/2012)  . Femoral bypass  1999; 2000    Brenda [name] 11/26/2012  . Carpal tunnel release Right 1980's?  . Vaginal hysterectomy  1988  . Cesarean section  1978  . Tubal ligation  1988  . Cataract extraction w/ intraocular lens implant Right 2011  . Ankle fracture surgery Right 2007    "got a metal plate and 8 screws in it" (11/26/2012)  . Bypass graft popliteal to popliteal Right 02/05/2013    Procedure: BYPASS GRAFT ABOVE KNEE POPLITEAL TO BELOW KNEE POPLITEAL WITH SMALL SAPHANOUS VEIN;  Surgeon: Brenda [name];  Location: [place];  Service: Vascular;  Laterality: Right;  . Eye surgery     Social History:  reports that she has been smoking Cigarettes.  She has a 50 pack-year smoking history. She has never used smokeless tobacco. She reports that she drinks about 6.0 oz of alcohol per week. She reports that she does not use illicit drugs.  Allergies  Allergen Reactions  . Tape Hives and Other (See Comments)    USE PAPER TAPE ONLY- Adhesive peels off skin-makes pt.  Raw.  Brenda [name] [Nicotine] Rash    To the PATCH only, breakouts on skin    Family History  Problem Relation Age of Onset  . Cancer Mother     pancreatic  . Diabetes Mother   . Hyperlipidemia Mother   . Hypertension Mother   . Other Mother     varicose vein  . Cancer Father 72    throat  . Heart disease Father   . Heart attack Father   . Cancer Sister     breast  . Diabetes Sister   . Deep vein thrombosis Brother   . Diabetes Brother   . Hearing loss Brother   . Hyperlipidemia Brother   . Hypertension Brother   . Heart attack Brother   . Clotting disorder Brother   . Diabetes Son   . Hyperlipidemia Son    mother deceased age 32 from pancreatic cancer. Father deceased age 65 from cancer.  Prior to Admission medications   Medication Sig Start Date End Date Taking? Authorizing Provider  acetaminophen (TYLENOL) 500 MG tablet Take 1,000 mg by mouth every 6 (six) hours as needed for pain.   Yes Historical Provider, [name]  albuterol (PROVENTIL HFA;VENTOLIN  HFA) 108 (90 BASE) MCG/ACT inhaler Inhale 2 puffs into the lungs every 6 (six) hours as needed for wheezing or shortness of breath. 01/20/15  Yes Belkys A Regalado, [name]  aspirin EC 81 MG tablet Take 81 mg by mouth daily.   Yes Historical Provider, [name]  buPROPion (WELLBUTRIN XL) 150 MG 24 hr tablet Take 1 tablet (150 mg total) by mouth daily. 08/18/14  Yes Allie Bossier, [name]  clopidogrel (PLAVIX) 75 MG tablet Take 75 mg by mouth daily.    Yes Historical Provider, [name]  docusate sodium (COLACE) 100 MG capsule Take 100 mg by mouth daily.   Yes Historical Provider, [name]  fish oil-omega-3 fatty acids 1000 MG capsule Take 1 g by mouth 2 (two) times daily.    Yes Historical Provider, [name]  Fluticasone Furoate-Vilanterol (BREO ELLIPTA IN) Inhale 1 puff into the lungs daily.   Yes Historical Provider, [name]  insulin glargine (LANTUS) 100 UNIT/ML injection Inject 0.2 mLs (20 Units total) into the skin daily. Patient taking differently: Inject 20 Units  into the skin 2 (two) times daily.  01/20/15  Yes Belkys A Regalado, [name]  insulin lispro (HUMALOG) 100 UNIT/ML injection Inject 3 Units into the skin 3 (three) times daily with meals as needed for high blood sugar.    Yes Historical Provider, [name]  Multiple Vitamins-Minerals (MULTIVITAMIN & MINERAL PO) Take 1 tablet by mouth daily.   Yes Historical Provider, [name]  nitroGLYCERIN (NITROLINGUAL) 0.4 MG/SPRAY spray Place 1 spray under the tongue every 5 (five) minutes as needed for chest pain. 01/20/15  Yes Belkys A Regalado, [name]  omeprazole (PRILOSEC) 20 MG capsule Take 20 mg by mouth daily.    Yes Historical Provider, [name]  rosuvastatin (CRESTOR) 20 MG tablet Take 20 mg by mouth daily.   Yes Historical Provider, [name]  Tiotropium Bromide Monohydrate (SPIRIVA RESPIMAT) 2.5 MCG/ACT AERS Inhale 2 puffs into the lungs daily. 12/19/14  Yes Juanito Doom, [name]  fluconazole (DIFLUCAN) 100 MG tablet Take 1 tablet (100 mg total) by mouth once. Patient not taking: Reported on 05/28/2015 05/18/15   Juanito Doom, [name]   Physical Exam: Filed Vitals:   05/28/15 1332 05/28/15 1339 05/28/15 1345 05/28/15 1400  BP: 132/68  118/68 115/67  Pulse: 102 101 99 100  Temp:      TempSrc:      Resp: _0 Height:      Weight:      SpO2: 98% 97% 98% 97%    Wt Readings from Last 3 Encounters:  05/28/15 58.06 kg (128 lb)  05/18/15 58.06 kg (128 lb)  01/30/15 58.06 kg (128 lb)    General:  Frail elderly appearing female laying on gurney with occasional coughing in no acute cardiopulmonary distress. Speaking in full sentences. Eyes: PERRLA, normal lids, irises & conjunctiva ENT: grossly normal hearing, lips & tongue. Oropharynx is clear, no lesions, no exudates. Dry mucous membranes. Neck: no LAD, masses or thyromegaly Cardiovascular: Tachycardia, no m/r/g. No LE edema. Telemetry: Sinus tachycardia Respiratory: Poor to fair air movement. Inspiratory and expiratory wheezing. Rhonchorous diffuse coarse breath  sounds. Abdomen: soft, ntnd, positive bowel sounds, no rebound, no guarding Skin: no rash or induration seen on limited exam Musculoskeletal: 5/5 right upper extremity strength. 5/5 right lower extremity strength. 3-4/5 left lower extremity strength. 3-4/5 left upper extremity strength. grossly normal tone BUE/BLE Psychiatric: grossly normal mood and affect, speech fluent and appropriate Neurologic: Alert and or to 3. Creatinine is 2 through 12 grossly intact. Visual  fields are intact. Sensations intact. Gait not tested secondary to safety.           Labs on Admission:  Basic Metabolic Panel:  Recent Labs Lab 05/28/15 1156  NA 131*  K 4.5  CL 93*  CO2 30  GLUCOSE 296*  BUN 18  CREATININE 1.05*  CALCIUM 8.7*   Liver Function Tests:  Recent Labs Lab 05/28/15 1156  AST 16  ALT 14  ALKPHOS 187*  BILITOT 1.2  PROT 5.7*  ALBUMIN 2.3*   No results for input(s): LIPASE, AMYLASE in the last 168 hours. No results for input(s): AMMONIA in the last 168 hours. CBC:  Recent Labs Lab 05/28/15 1156  WBC 20.2*  NEUTROABS 19.2*  HGB 13.8  HCT 41.6  MCV 91.8  PLT 274   Cardiac Enzymes: No results for input(s): CKTOTAL, CKMB, CKMBINDEX, TROPONINI in the last 168 hours.  BNP (last 3 results) No results for input(s): BNP in the last 8760 hours.  ProBNP (last 3 results) No results for input(s): PROBNP in the last 8760 hours.  CBG: No results for input(s): GLUCAP in the last 168 hours.  Radiological Exams on Admission: Dg Chest Portable 1 View  05/28/2015  CLINICAL DATA:  64 year old female with chest tightness and shortness of breath as well as fever EXAM: PORTABLE CHEST 1 VIEW COMPARISON:  Prior chest x-ray 05/18/2015 FINDINGS: Patchy airspace opacities in both lower lobes. The cardiac and mediastinal contours are within normal limits. Trace atherosclerotic calcifications present in the transverse aorta. A background of chronic interstitial prominence and bronchitic  changes similar compared to prior. No definite pleural effusion or pneumothorax. No acute osseous abnormality. IMPRESSION: Interval development of patchy airspace opacities in both lung bases concerning for possible multi lobar pneumonia. Electronically Signed   By: Jacqulynn Cadet M.D.   On: 05/28/2015 12:02    EKG: None  Assessment/Plan Principal Problem:   Acute on chronic respiratory failure with hypoxia ([name]) Active Problems:   Recurrent pneumonia   DM (diabetes mellitus), type 2, uncontrolled ([name])   History of CVA (cerebrovascular accident)   Tobacco abuse   Hyperlipemia   Peripheral vascular disease, unspecified ([place])   COPD exacerbation ([name])   Sepsis ([place])   Leukocytosis   Dehydration   Hyponatremia  #1 acute on chronic respiratory failure with hypoxia likely secondary to recurrent pneumonia and COPD exacerbation. Patient presented with acute on chronic respiratory failure with hypoxia requiring increased oxygen requirements. The patient this is her fourth bout of pneumonia. Patient had pneumonia 08/19/2014 than another episode 01/14/2015 and another episode of 04/20/2015. Patient with ongoing tobacco abuse. Patient is also being followed by pulmonary in the outpatient setting. Will admit patient to telemetry floor. Will get a CT of the chest for further evaluation due to recurrent pneumonias. Will check a sputum Gram stain and culture. Check a urine Legionella antigen. Check a urine pneumococcus antigen. Check blood cultures 2. Check an influenza PCR. Check a respiratory virus panel. Will place empirically on broad coverage antibiotics with IV vancomycin, IV cefepime, IV azithromycin. Place on oxygen, scheduled nebulizers, Mucinex, Tamiflu, IV steroid taper, Pulmicort, Brovana, pulmonary toilet. Will likely need to consult with pulmonary for further evaluation and management. Patient may need a bronchoscopy for further evaluation due to the recurrent nature of her  pneumonias.  #2 acute COPD exacerbation Likely triggered by recurrent pneumonia. Patient with a productive cough. Check a sputum Gram stain and culture. Blood cultures pending. Placed empirically on IV antibiotics. Placed on Pulmicort and Brovana nebulizers.  Duonebs scheduled and as needed, Flonase, Claritin, chest PT, flutter valve. Follow.  #3 recurrent pneumonia This is patient's fourth episode of pneumonia this year per patient. Patient stated has had pneumonias 08/14/2014, 01/14/2015, 04/20/2015. Check a sputum Gram stain and culture. Check a urine Legionella antigen. Check a urine pneumococcus antigen. Check blood cultures 2. Placed empirically on broad coverage antibiotics of IV vancomycin, IV cefepime, IV azithromycin. CT chest. Pulmonary consultation.  #4 hyponatremia Likely secondary to dehydration. IV fluids. Follow.  #5 tobacco abuse Tobacco cessation. Patient states she is allergic to the neck or down.  #6 leukocytosis Likely secondary to problem #1. Panculture. Placed empirically on IV vancomycin, IV cefepime, IV azithromycin.  #7 sepsis Patient met criteria for sepsis with tachycardia and leukocytosis. Likely secondary to recurrent pneumonia. Lactic acid level is within normal limits. Check a pro calcitonin level. Patient has been panculture. Place empirically on IV antibiotics as stated above. Follow.  #8 dehydration IV fluids.  #9 diabetes mellitus type 2 Check a hemoglobin A1c. Place on home regimen of long-acting insulin of Lantus. Sliding scale insulin.  #10 history of CVA with residual left-sided weakness Stable. Continue aspirin and Plavix for secondary stroke prevention.  #11 coronary artery disease Stable. Check a EKG. Continue Crestor, nitroglycerin as needed, aspirin.  #12 constipation Placed on MiraLAX twice daily. Stool softener.  #13 hyperlipidemia Continue statin.  14 prophylaxis PPI for GI prophylaxis. Lovenox for DVT prophylaxis.   Code  Status: Full DVT Prophylaxis: Lovenox Family Communication: Updated patient and daughter at bedside. Disposition Plan: Admit to telemetry.  Time spent: [facility] Pager [phone]

## 2015-05-28 NOTE — Progress Notes (Signed)
Pt. Arrived to the unit accompanied by NT and family. Pt. Is alert and oriented with no signs of distress noted. Pt. Vitals appear stable with no skin issues noted. Educated pt. On use of staff numbers, room telephone and call bell. Call light within reach. Orders released. No further needs noted at this time.

## 2015-05-29 ENCOUNTER — Inpatient Hospital Stay (HOSPITAL_COMMUNITY): Payer: Medicare Other

## 2015-05-29 ENCOUNTER — Encounter (HOSPITAL_COMMUNITY): Payer: Self-pay | Admitting: Pulmonary Disease

## 2015-05-29 DIAGNOSIS — Z8673 Personal history of transient ischemic attack (TIA), and cerebral infarction without residual deficits: Secondary | ICD-10-CM

## 2015-05-29 DIAGNOSIS — J189 Pneumonia, unspecified organism: Secondary | ICD-10-CM

## 2015-05-29 DIAGNOSIS — E871 Hypo-osmolality and hyponatremia: Secondary | ICD-10-CM

## 2015-05-29 DIAGNOSIS — J441 Chronic obstructive pulmonary disease with (acute) exacerbation: Secondary | ICD-10-CM

## 2015-05-29 DIAGNOSIS — J9621 Acute and chronic respiratory failure with hypoxia: Secondary | ICD-10-CM

## 2015-05-29 LAB — CBC WITH DIFFERENTIAL/PLATELET
BASOS ABS: 0 10*3/uL (ref 0.0–0.1)
Basophils Relative: 0 %
Eosinophils Absolute: 0 10*3/uL (ref 0.0–0.7)
Eosinophils Relative: 0 %
HEMATOCRIT: 37.6 % (ref 36.0–46.0)
HEMOGLOBIN: 11.9 g/dL — AB (ref 12.0–15.0)
LYMPHS PCT: 3 %
Lymphs Abs: 0.2 10*3/uL — ABNORMAL LOW (ref 0.7–4.0)
MCH: 29.5 pg (ref 26.0–34.0)
MCHC: 31.6 g/dL (ref 30.0–36.0)
MCV: 93.1 fL (ref 78.0–100.0)
MONO ABS: 0.1 10*3/uL (ref 0.1–1.0)
Monocytes Relative: 1 %
NEUTROS ABS: 7.5 10*3/uL (ref 1.7–7.7)
NEUTROS PCT: 96 %
Platelets: 218 10*3/uL (ref 150–400)
RBC: 4.04 MIL/uL (ref 3.87–5.11)
RDW: 14.6 % (ref 11.5–15.5)
WBC: 7.8 10*3/uL (ref 4.0–10.5)

## 2015-05-29 LAB — GLUCOSE, CAPILLARY
GLUCOSE-CAPILLARY: 209 mg/dL — AB (ref 65–99)
GLUCOSE-CAPILLARY: 194 mg/dL — AB (ref 65–99)
Glucose-Capillary: 335 mg/dL — ABNORMAL HIGH (ref 65–99)
Glucose-Capillary: 236 mg/dL — ABNORMAL HIGH (ref 65–99)

## 2015-05-29 LAB — COMPREHENSIVE METABOLIC PANEL
ALK PHOS: 186 U/L — AB (ref 38–126)
ALT: 22 U/L (ref 14–54)
AST: 31 U/L (ref 15–41)
Albumin: 2.1 g/dL — ABNORMAL LOW (ref 3.5–5.0)
Anion gap: 10 (ref 5–15)
BILIRUBIN TOTAL: 0.9 mg/dL (ref 0.3–1.2)
BUN: 14 mg/dL (ref 6–20)
CALCIUM: 8.2 mg/dL — AB (ref 8.9–10.3)
CO2: 23 mmol/L (ref 22–32)
CREATININE: 0.65 mg/dL (ref 0.44–1.00)
Chloride: 102 mmol/L (ref 101–111)
GFR calc Af Amer: >60 mL/min (ref >60)
GLUCOSE: 252 mg/dL — AB (ref 65–99)
Potassium: 4.5 mmol/L (ref 3.5–5.1)
Sodium: 135 mmol/L (ref 135–145)
TOTAL PROTEIN: 5.1 g/dL — AB (ref 6.5–8.1)

## 2015-05-29 LAB — URINE CULTURE: CULTURE: NO GROWTH

## 2015-05-29 MED ORDER — RESOURCE THICKENUP CLEAR PO POWD
ORAL | Status: DC | PRN
  Filled 2015-05-29: qty 125

## 2015-05-29 MED ORDER — IOHEXOL 300 MG/ML  SOLN
75.0000 mL | Freq: Once | INTRAMUSCULAR | Status: AC | PRN
  Administered 2015-05-29: 75 mL via INTRAVENOUS

## 2015-05-29 MED ORDER — METHYLPREDNISOLONE SODIUM SUCC 125 MG IJ SOLR
60.0000 mg | Freq: Four times a day (QID) | INTRAMUSCULAR | Status: DC
Start: 2015-05-29 — End: 2015-05-30
  Administered 2015-05-29 – 2015-05-30 (×5): 60 mg via INTRAVENOUS
  Filled 2015-05-29 (×5): qty 2

## 2015-05-29 MED ORDER — BUDESONIDE 0.5 MG/2ML IN SUSP
0.5000 mg | Freq: Two times a day (BID) | RESPIRATORY_TRACT | Status: DC
  Administered 2015-05-29 – 2015-06-02 (×8): 0.5 mg via RESPIRATORY_TRACT
  Filled 2015-05-29 (×9): qty 2

## 2015-05-29 MED ORDER — DEXTROSE 5 % IV SOLN
2.0000 g | INTRAVENOUS | Status: DC
  Administered 2015-05-29 – 2015-06-01 (×4): 2 g via INTRAVENOUS
  Filled 2015-05-29 (×5): qty 2

## 2015-05-29 NOTE — Progress Notes (Addendum)
Patient complaining of chest pain rating it a 8 midchest and having shortness of breath at 2145. Pt wheezing. Pt's VS stable. Gave duoneb prn treatment. Respiratory therapist Janett Billow came to bedside to assess pt. Notified Schorr, NP of situation. Pt's states her chest pain is 3 now and feels better. No new orders given by Hilbert Bible, NP. Will continue to monitor pt. Ranelle Oyster, RN (charge nurse)

## 2015-05-29 NOTE — Progress Notes (Signed)
[facility] Progress Note   Brenda [name]  HUT:[id number]  DOB: Feb 01, 1951  DOA: 05/28/2015 PCP: [name], MD  Brief narrative: Brenda [name] is a 64 y.o. 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    Subjective: Severe cough and dyspnea after bouts of cough.   Assessment/Plan: Principal Problem:   Acute on chronic respiratory failure with hypoxia- bilateral pneumonia- COPD exacerbation - MBS shows high aspiration risk and honey hick liquids recommended- this is likely the cause of recurrent pneumonias but she is also strep pneumo antigen postive- cont Rocephin per pulmonary -cont Nebs and solumedrol - influenza negative- stop tamiflu   Active Problems:   DM (diabetes mellitus), type 2, uncontrolled - cont Lantus and mealtime insulin    History of CVA (cerebrovascular accident) and CAD s/p stents -cont ASA, Plavix and statin  Hyponatremia - improved  AKI - improved   Code Status:     Code Status Orders        Start     Ordered   05/28/15 1529  Full code   Continuous     05/28/15 1529     Family Communication: husband Disposition Plan:   DVT prophylaxis: Lovenox Consultants: PCCM Procedures: Antibiotics: Anti-infectives    Start     Dose/Rate Route Frequency Ordered Stop   05/29/15 1300  cefTRIAXone (ROCEPHIN) 2 g in dextrose 5 % 50 mL IVPB     2 g 100 mL/hr over 30 Minutes Intravenous Every 24 hours 05/29/15 1138     05/29/15 1230  cefTRIAXone (ROCEPHIN) 1 g in dextrose 5 % 50 mL IVPB  Status:  Discontinued     1 g 100 mL/hr over 30 Minutes Intravenous Every 24 hours 05/28/15 1237 05/28/15 1529   05/29/15 1230  azithromycin (ZITHROMAX) 500 mg in dextrose 5 % 250 mL IVPB  Status:  Discontinued     500 mg 250 mL/hr over 60 Minutes Intravenous Every 24 hours 05/28/15 1237 05/29/15 1138   05/29/15 0430   vancomycin (VANCOCIN) 500 mg in sodium chloride 0.9 % 100 mL IVPB  Status:  Discontinued     500 mg 100 mL/hr over 60 Minutes Intravenous Every 12 hours 05/28/15 1536 05/29/15 1138   05/28/15 2200  oseltamivir (TAMIFLU) capsule 75 mg     75 mg Oral 2 times daily 05/28/15 1529 06/02/15 2159   05/28/15 1630  vancomycin (VANCOCIN) IVPB 1000 mg/200 mL premix     1,000 mg 200 mL/hr over 60 Minutes Intravenous  Once 05/28/15 1536 05/28/15 1835   05/28/15 1630  ceFEPIme (MAXIPIME) 1 g in dextrose 5 % 50 mL IVPB  Status:  Discontinued     1 g 100 mL/hr over 30 Minutes Intravenous Every 24 hours 05/28/15 1536 05/29/15 1138   05/28/15 1530  azithromycin (ZITHROMAX) 500 mg in dextrose 5 % 250 mL IVPB  Status:  Discontinued     500 mg 250 mL/hr over 60 Minutes Intravenous Every 24 hours 05/28/15 1529 05/28/15 1538   05/28/15 1230  cefTRIAXone (ROCEPHIN) 1 g in dextrose 5 % 50 mL IVPB     1 g 100 mL/hr over 30 Minutes Intravenous  Once 05/28/15 1229 05/28/15 1320   05/28/15 1230  azithromycin (ZITHROMAX) 500 mg in dextrose 5 % 250 mL IVPB     500 mg 250 mL/hr over 60 Minutes Intravenous  Once 05/28/15 1229 05/28/15 1412      Objective: Filed Weights   05/28/15 1138  Weight: 58.06 kg (128 lb)    Intake/Output Summary (Last 24 hours) at 05/29/15 1644 Last data filed at 05/29/15 1601  Gross per 24 hour  Intake      0 ml  Output   1050 ml  Net  -1050 ml     Vitals Filed Vitals:   05/29/15 0240 05/29/15 0507 05/29/15 0821 05/29/15 1500  BP: 100/48 95/52  100/61  Pulse: 76 72 71 71  Temp: 97.4 F (36.3 C) 97.4 F (36.3 C)  98.4 F (36.9 C)  TempSrc: Oral Oral  Oral  Resp: '20 18 19 19  '$ Height:      Weight:      SpO2: 98% 97% 98% 100%    Exam:  General:  Pt is alert, not in acute distress  HEENT: No icterus, No thrush, oral mucosa moist  Cardiovascular: regular rate and rhythm, S1/S2 No murmur  Respiratory: b/l rhonchi  Abdomen: Soft, +Bowel sounds, non tender, non  distended, no guarding  MSK: No LE edema, cyanosis or clubbing  Data Reviewed: Basic Metabolic Panel:  Recent Labs Lab 05/28/15 1156 05/28/15 1710 05/29/15 0602  NA 131*  --  135  K 4.5  --  4.5  CL 93*  --  102  CO2 30  --  23  GLUCOSE 296*  --  252*  BUN 18  --  14  CREATININE 1.05*  --  0.65  CALCIUM 8.7*  --  8.2*  MG  --  1.7  --    Liver Function Tests:  Recent Labs Lab 05/28/15 1156 05/29/15 0602  AST 16 31  ALT 14 22  ALKPHOS 187* 186*  BILITOT 1.2 0.9  PROT 5.7* 5.1*  ALBUMIN 2.3* 2.1*   No results for input(s): LIPASE, AMYLASE in the last 168 hours. No results for input(s): AMMONIA in the last 168 hours. CBC:  Recent Labs Lab 05/28/15 1156 05/29/15 0602  WBC 20.2* 7.8  NEUTROABS 19.2* 7.5  HGB 13.8 11.9*  HCT 41.6 37.6  MCV 91.8 93.1  PLT 274 218   Cardiac Enzymes: No results for input(s): CKTOTAL, CKMB, CKMBINDEX, TROPONINI in the last 168 hours. BNP (last 3 results) No results for input(s): BNP in the last 8760 hours.  ProBNP (last 3 results) No results for input(s): PROBNP in the last 8760 hours.  CBG:  Recent Labs Lab 05/28/15 1759 05/28/15 2135 05/29/15 0801 05/29/15 1158 05/29/15 1608  GLUCAP 169* 207* 209* 194* 335*    Recent Results (from the past 240 hour(s))  Blood Culture (routine x 2)     Status: None (Preliminary result)   Collection Time: 05/28/15 12:02 PM  Result Value Ref Range Status   Specimen Description BLOOD LEFT ANTECUBITAL  Final   Special Requests BOTTLES DRAWN AEROBIC AND ANAEROBIC 5MLS  Final   Culture NO GROWTH < 24 HOURS  Final   Report Status PENDING  Incomplete  Blood Culture (routine x 2)     Status: None (Preliminary result)   Collection Time: 05/28/15 12:50 PM  Result Value Ref Range Status   Specimen Description BLOOD RIGHT ANTECUBITAL  Final   Special Requests BOTTLES DRAWN AEROBIC AND ANAEROBIC 10MLS  Final   Culture NO GROWTH < 24 HOURS  Final   Report Status PENDING  Incomplete  Urine  culture     Status: None (Preliminary result)   Collection Time: 05/28/15  3:29 PM  Result  Value Ref Range Status   Specimen Description URINE, RANDOM  Final   Special Requests ADDED 2222  Final   Culture NO GROWTH < 24 HOURS  Final   Report Status PENDING  Incomplete  MRSA PCR Screening     Status: None   Collection Time: 05/28/15  6:05 PM  Result Value Ref Range Status   MRSA by PCR NEGATIVE NEGATIVE Final    Comment:        The GeneXpert MRSA Assay (FDA approved for NASAL specimens only), is one component of a comprehensive MRSA colonization surveillance program. It is not intended to diagnose MRSA infection nor to guide or monitor treatment for MRSA infections.      Studies: Ct Chest W Contrast  05/29/2015  CLINICAL DATA:  Cough, recurrent pneumonia EXAM: CT CHEST WITH CONTRAST TECHNIQUE: Multidetector CT imaging of the chest was performed during intravenous contrast administration. CONTRAST:  43m OMNIPAQUE IOHEXOL 300 MG/ML  SOLN COMPARISON:  Chest x-ray 05/28/2015 and CT scan of the chest 11/16/2014 FINDINGS: Sagittal images of the spine shows osteopenia and mild degenerative change thoracic spine. Central airways are patent. Images of the thoracic inlet are unremarkable. Mild atherosclerotic calcifications of thoracic aorta. A precarinal lymph node measures 7 mm short-axis stable from prior exam not pathologic by size criteria. No hilar adenopathy. Atherosclerotic calcifications of coronary arteries again noted. Atherosclerotic calcifications of abdominal aorta and splenic artery. No adrenal gland mass is noted in visualized upper abdomen. Mild fatty infiltration of the liver. There is small bilateral pleural effusion. Bilateral emphysematous changes again noted. Stable tiny bilateral pulmonary nodules the largest in axial image 16 measures 5 mm in right upper lobe stable in size in appearance from prior exam. As noted on recent chest x-ray there is bilateral peripheral  interstitial thickening in right middle lobe, right lower lobe posterior laterally and left lower lobe anterolaterally please see axial image 47. There are superimposed patchy infiltrate/ alveolar airspace is right middle lobe medially right lower lobe posterior laterally and left lower lobe anterolaterally. Findings consistent with interstitial infiltrate/ multifocal pneumonia. IMPRESSION: 1. Bilateral emphysematous changes again noted. Multifocal infiltrate/pneumonia and peripheral interstitial thickening noted in right middle lobe left lower lobe laterally and right lower lobe posterior laterally. Follow-up to resolution after appropriate treatment is recommended. 2. Stable subcentimeter nodules bilaterally the largest in right upper lobe anteriorly measures 5 mm. 3. No mediastinal or hilar adenopathy. 4. Mild degenerative changes thoracic spine Electronically Signed   By: LLahoma CrockerM.D.   On: 05/29/2015 09:28   Dg Chest Portable 1 View  05/28/2015  CLINICAL DATA:  64year old female with chest tightness and shortness of breath as well as fever EXAM: PORTABLE CHEST 1 VIEW COMPARISON:  Prior chest x-ray 05/18/2015 FINDINGS: Patchy airspace opacities in both lower lobes. The cardiac and mediastinal contours are within normal limits. Trace atherosclerotic calcifications present in the transverse aorta. A background of chronic interstitial prominence and bronchitic changes similar compared to prior. No definite pleural effusion or pneumothorax. No acute osseous abnormality. IMPRESSION: Interval development of patchy airspace opacities in both lung bases concerning for possible multi lobar pneumonia. Electronically Signed   By: HJacqulynn CadetM.D.   On: 05/28/2015 12:02   Dg Swallowing Func-speech Pathology  05/29/2015  Objective Swallowing Evaluation:   MBS Patient Details Name: Brenda GREGGMRN: [id number] of Birth: 312-Sep-1952Today's Date: 05/29/2015 Time: SLP Start Time (ACUTE ONLY): 1418-SLP  Stop Time (ACUTE ONLY): 1438 SLP Time Calculation (min) (ACUTE ONLY): 20 min Past  Medical History: Past Medical History Diagnosis Date . History of DVT (deep vein thrombosis)  . Coronary artery disease    multiple stens . COPD (chronic obstructive pulmonary disease) ([place])  . Transient ischemic attack  . Depression  . Hx of radiation therapy 01/16/09 - 03/06/09   base of tongue, right neck node . Peripheral arterial disease ([name])    status post bilateral femoropopliteal bypass graft performed by Dr. Sherren Mocha early in the past with significant right lower extremity lifestyle limiting claudication . Pneumonia  . Hypercholesteremia  . Chronic bronchitis ([place])  . Shortness of breath    "w/exertion and sometimes when laying down" (11/26/2012) . Type II diabetes mellitus ([place])  . History of blood transfusion 1955 . GERD (gastroesophageal reflux disease)  . H/O hiatal hernia  . Stroke [facility]) 2000   left side weakness remains (11/26/2012) . Arthritis    "hands" (11/26/2012) . Cancer of base of tongue ([place]) 11/04/2008   "& lymph nodes @ right neck" (11/26/2012 . Myocardial infarction [facility]) 2005   dr Ellyn Hack  . Fatigue 08/23/2013 . Carotid artery occlusion  Past Surgical History: Past Surgical History Procedure Laterality Date . Tee without cardioversion  07/26/2011   Procedure: TRANSESOPHAGEAL ECHOCARDIOGRAM (TEE);  Surgeon: Pixie Casino, MD;  Location: [facility] ENDOSCOPY;  Service: Cardiovascular;  Laterality: N/A; . Esophagogastroduodenoscopy  12/20/2011   Procedure: ESOPHAGOGASTRODUODENOSCOPY (EGD);  Surgeon: Beryle Beams, MD;  Location: [facility] ENDOSCOPY;  Service: Endoscopy;  Laterality: N/A;  EGD with balloon dilation . Savory dilation  12/20/2011   Procedure: SAVORY DILATION;  Surgeon: Beryle Beams, MD;  Location: [facility] ENDOSCOPY;  Service: Endoscopy;  Laterality: N/A; . Tonsillectomy  1969 . Appendectomy  1962 . Cardiac catheterization  1999; 11/26/2012 . Coronary angioplasty with stent placement  1999--2005   "i think I have 3-5 total"  (11/26/2012) . Femoral bypass  1999; 2000   notes 11/26/2012 . Carpal tunnel release Right 1980's? . Vaginal hysterectomy  1988 . Cesarean section  1978 . Tubal ligation  1988 . Cataract extraction w/ intraocular lens implant Right 2011 . Ankle fracture surgery Right 2007   "got a metal plate and 8 screws in it" (11/26/2012) . Bypass graft popliteal to popliteal Right 02/05/2013   Procedure: BYPASS GRAFT ABOVE KNEE POPLITEAL TO BELOW KNEE POPLITEAL WITH SMALL SAPHANOUS VEIN;  Surgeon: Rosetta Posner, MD;  [facility];  Service: Vascular;  Laterality: Right; . Eye surgery   HPI: 64 y.o. 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 Chest x-ray was concerning for interval development of patchy opacities in both lung bases concerning for possible multilobar pneumonia. 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 No Data Recorded Assessment / Plan / Recommendation CHL IP CLINICAL IMPRESSIONS 05/29/2015 Therapy Diagnosis Moderate pharyngeal phase dysphagia;Severe pharyngeal phase dysphagia Clinical Impression Pt exhibited a moderate-severe motor based pharyngeal dysphagia characterized by decreased larygneal elevation and closure, reduced tongue base retraction leading to penetration and aspiration both during and after (from residual). Pt produces a inconsistent reflexive cough that is ineffective in clearing vocal cord penetrates. Chin  tuck posture was ineffective to mitigate or  prevent laryngeal compromise. Esophagus briefly scan revealing stasis (known dysmotility). Although increasing pharyngeal residue mildly, honey thick appeared to produce a significant decrease in aspiration during or after swallow. Verbal cues for second swallow decreases residue although aspiration risk increases if pt does not perform second swallow. Pt reports "thicker things are harder to go down and I need something thin to wash it down". Recommend the water protocol to allow pt thin water (only) after oral care in beween meals. ST will continue to follow. If pt reports difficulty with honey thick liquids, may need to consider allowing nectar.     Impact on safety and function Severe aspiration risk   CHL IP TREATMENT RECOMMENDATION 05/29/2015 Treatment Recommendations Therapy as outlined in treatment plan below   Prognosis 05/29/2015 Prognosis for Safe Diet Advancement Fair Barriers to Reach Goals Severity of deficits Barriers/Prognosis Comment -- CHL IP DIET RECOMMENDATION 05/29/2015 SLP Diet Recommendations Dysphagia 3 (Mech soft) solids;Honey thick liquids Liquid Administration via Cup Medication Administration Crushed with puree Compensations Slow rate;Small sips/bites;Multiple dry swallows after each bite/sip;Follow solids with liquid;Clear throat intermittently Postural Changes Remain semi-upright after after feeds/meals (Comment);Seated upright at 90 degrees   CHL IP OTHER RECOMMENDATIONS 05/29/2015 Recommended Consults -- Oral Care Recommendations Oral care BID Other Recommendations Order thickener from pharmacy   CHL IP FOLLOW UP RECOMMENDATIONS 05/29/2015 Follow up Recommendations (No Data)   CHL IP FREQUENCY AND DURATION 05/29/2015 Speech Therapy Frequency (ACUTE ONLY) min 2x/week Treatment Duration 2 weeks      CHL IP ORAL PHASE 05/29/2015 Oral Phase Impaired Oral - Pudding Teaspoon -- Oral - Pudding Cup -- Oral - Honey Teaspoon -- Oral - Honey Cup -- Oral - Nectar Teaspoon -- Oral - Nectar Cup --  Oral - Nectar Straw -- Oral - Thin Teaspoon -- Oral - Thin Cup -- Oral - Thin Straw -- Oral - Puree -- Oral - Mech Soft -- Oral - Regular Delayed oral transit Oral - Multi-Consistency -- Oral - Pill -- Oral Phase - Comment --  CHL IP PHARYNGEAL PHASE 05/29/2015 Pharyngeal Phase Impaired Pharyngeal- Pudding Teaspoon -- Pharyngeal -- Pharyngeal- Pudding Cup -- Pharyngeal -- Pharyngeal- Honey Teaspoon -- Pharyngeal -- Pharyngeal- Honey Cup Pharyngeal residue - pyriform;Pharyngeal residue - valleculae;Reduced tongue base retraction;Reduced laryngeal elevation;Reduced airway/laryngeal closure Pharyngeal -- Pharyngeal- Nectar Teaspoon -- Pharyngeal -- Pharyngeal- Nectar Cup Penetration/Apiration after swallow;Reduced airway/laryngeal closure;Reduced laryngeal elevation;Pharyngeal residue - pyriform;Pharyngeal residue - valleculae;Reduced tongue base retraction Pharyngeal Material enters airway, passes BELOW cords without attempt by patient to eject out (silent aspiration);Material enters airway, passes BELOW cords and not ejected out despite cough attempt by patient Pharyngeal- Nectar Straw -- Pharyngeal -- Pharyngeal- Thin Teaspoon -- Pharyngeal -- Pharyngeal- Thin Cup Penetration/Aspiration during swallow;Reduced airway/laryngeal closure;Pharyngeal residue - valleculae;Pharyngeal residue - pyriform;Reduced laryngeal elevation;Reduced tongue base retraction;Penetration/Apiration after swallow Pharyngeal Material enters airway, passes BELOW cords without attempt by patient to eject out (silent aspiration) Pharyngeal- Thin Straw -- Pharyngeal -- Pharyngeal- Puree NT Pharyngeal -- Pharyngeal- Mechanical Soft -- Pharyngeal -- Pharyngeal- Regular Pharyngeal residue - valleculae;Reduced tongue base retraction Pharyngeal -- Pharyngeal- Multi-consistency -- Pharyngeal -- Pharyngeal- Pill -- Pharyngeal -- Pharyngeal Comment --  CHL IP CERVICAL ESOPHAGEAL PHASE 05/29/2015 Cervical Esophageal Phase WFL Pudding Teaspoon -- Pudding  Cup -- Honey Teaspoon -- Honey Cup -- Nectar Teaspoon -- Nectar Cup -- Nectar Straw -- Thin Teaspoon -- Thin Cup -- Thin Straw -- Puree -- Mechanical Soft -- Regular -- Multi-consistency -- Pill -- Cervical Esophageal Comment --  [place] Siren 05/29/2015, 3:36 PM Orbie Pyo Colvin Caroli.Ed CCC-SLP Pager [phone]               Scheduled Meds:  Scheduled Meds: . arformoterol  15 mcg Nebulization BID  . aspirin EC  81 mg Oral Daily  . budesonide (PULMICORT) nebulizer solution  0.5 mg Nebulization BID  . buPROPion  150 mg Oral Daily  . cefTRIAXone (ROCEPHIN)  IV  2 g Intravenous Q24H  . clopidogrel  75 mg Oral Daily  . enoxaparin (LOVENOX) injection  40 mg Subcutaneous Q24H  . feeding supplement (ENSURE ENLIVE)  237 mL Oral BID BM  . guaiFENesin  1,200 mg Oral BID  . insulin aspart  0-20 Units Subcutaneous TID WC  . insulin aspart  0-5 Units Subcutaneous QHS  . insulin aspart  3 Units Subcutaneous TID WC  . insulin glargine  20 Units Subcutaneous BID  . ipratropium-albuterol  3 mL Nebulization Q6H  . loratadine  10 mg Oral Daily  . methylPREDNISolone (SOLU-MEDROL) injection  60 mg Intravenous Q6H  . omega-3 acid ethyl esters  1 g Oral BID  . oseltamivir  75 mg Oral BID  . pantoprazole  40 mg Oral QAC breakfast  . polyethylene glycol  17 g Oral Daily  . rosuvastatin  20 mg Oral QHS   Continuous Infusions:   Time spent on care of this patient: 47 min   [place], MD 05/29/2015, 4:44 PM  LOS: 1 day   [facility] Office  [phone] Pager - Text Page per [email or web address] If 7PM-7AM, please contact night-coverage [email or web address]

## 2015-05-29 NOTE — Care Management Note (Signed)
Case Management Note  Patient Details  Name: KYRA LAFFEY MRN: [id number] Date of Birth: 1950/12/22  Subjective/Objective:  Patient admitted with pneumonia. Patient is from home with her spouse, and with supplemental oxygen at 3L [place] and home health through [facility].                  Action/Plan: No follow up per OT. Awaiting PT recommendations. CM will continue to follow for discharge needs.   Expected Discharge Date:                  Expected Discharge Plan:     In-House Referral:     Discharge planning Services     Post Acute Care Choice:    Choice offered to:     DME Arranged:    DME Agency:     HH Arranged:    HH Agency:     Status of Service:  In process, will continue to follow  Medicare Important Message Given:    Date Medicare IM Given:    Medicare IM give by:    Date Additional Medicare IM Given:    Additional Medicare Important Message give by:     If discussed at [place] of Stay Meetings, dates discussed:    Additional Comments:  Pollie Friar, RN 05/29/2015, 1:36 PM

## 2015-05-29 NOTE — Progress Notes (Signed)
Speech Language Pathology Patient Details Name: Brenda [name] MRN: [id number] DOB: Mar 28, 1951 Today's Date: 05/29/2015 Time:  -       MBS scheduled today at 1400.      Orbie Pyo [place].Ed [employer] [phone]

## 2015-05-29 NOTE — Progress Notes (Signed)
PT Cancellation Note  Patient Details Name: Brenda [name] MRN: [id number] DOB: [birth year]   Cancelled Treatment:    Reason Eval/Treat Not Completed: Patient at procedure or test/unavailable.  Try later as time allows.   Ramond Dial 05/29/2015, 9:21 AM   Mee Hives, PT MS Acute Rehab Dept. Number: [facility] [id number] and [place] [phone]

## 2015-05-29 NOTE — Progress Notes (Signed)
Initial Nutrition Assessment  DOCUMENTATION CODES:   Not applicable  INTERVENTION:   -Continue Ensure Enlive po BID, each supplement provides 350 kcal and 20 grams of protein -RD will adjust supplement regimen if indicated based on MBSS results  NUTRITION DIAGNOSIS:   Inadequate oral intake related to dysphagia, poor appetite as evidenced by meal completion < 25%.  GOAL:   Patient will meet greater than or equal to 90% of their needs  MONITOR:   PO intake, Supplement acceptance, Labs, Weight trends, Skin, I & O's  REASON FOR ASSESSMENT:   Malnutrition Screening Tool    ASSESSMENT:   Brenda [name] is a 64 year old female With history of coronary artery disease status post multiple stents, COPD on chronic home O2 3 L nasal cannula, depression, history of CVA with residual left-sided weakness, carotid artery occlusion, gastroesophageal reflux disease, type 2 diabetes bolus had recurrent pneumonias approximately 3 over the past year (08/19/2014, 01/14/2015, 04/20/2015 per patient) who presents to the ED with a one-week history of fevers with temperatures as high as 101, worsening shortness of breath on exertion requiring increased oxygen requirements, wheezing, upper respiratory symptoms of rhinorrhea chest congestion increasing mucus, chills, myalgias, cough or productive yellowish sputum and some rib pain with coughing. She stated she tried saline solution and rhinorrhea that worse. Patient endorses some nausea and chronic constipation. Patient denies any emesis, no chest pain, no abdominal pain, no diarrhea, no dysuria, no melena, no hematemesis, no hematochezia.  Pt admitted with acute on chronic respiratory failure with hypoxia likely secondary to recurrent pneumonia and COPD exacerbation.  Pt somnolent at time of visit and did not arouse when name was called. Pt was covered with multiple blankets; unable to complete Nutrition-Focused physical exam at this time.   Meal tray on  sink was unattempted. Pt consumed approximately 50% of Ensure supplement on tray table.  Wt hx reviewed. No weight changes significant for time frame.  Per chart review, pt has been experiencing swallowing difficulties and complains of the sensation of food being stuck in her esophagus. Per SLP note, MBSS is scheduled for 1400 today.  Noted palliative care consult pending.   Labs reviewed: CBGS: 207-209.   Diet Order:  Diet heart healthy/carb modified Room service appropriate?: Yes; Fluid consistency:: Thin  Skin:  Reviewed, no issues  Last BM:  PTA  Height:   Ht Readings from Last 1 Encounters:  05/28/15 '5\' 6"'$  (1.676 m)    Weight:   Wt Readings from Last 1 Encounters:  05/28/15 128 lb (58.06 kg)    Ideal Body Weight:  59.1 kg  BMI:  Body mass index is 20.67 kg/(m^2).  Estimated Nutritional Needs:   Kcal:  1450-1650  Protein:  65-75 grams  Fluid:  1.4-1.6 L  EDUCATION NEEDS:   No education needs identified at this time  Brenda [name] A. Jimmye Norman, RD, LDN, CDE Pager: [phone] After hours Pager: [phone]

## 2015-05-29 NOTE — Evaluation (Signed)
Physical Therapy Evaluation Patient Details Name: Brenda [name] MRN: [id number] DOB: [birth year] Today's Date: 05/29/2015   History of Present Illness  Pt admitted with acute on chronic respiratory failure.  PMH: recurrent PNA, COPD with home 02 (3L), CAD with stents, CVA, DM, smoker.  Clinical Impression  Pt is getting up to stand with issues maintaining O2 sat, but is tired from going to tesing this AM and leaving to get swallow study after PT.  Pt was returned to bed for the testing but cannot move without desaturating even on 3L O2.  Will recommend SNF as pt is very debilitated and will need to get more conditioned to tolerate returning home.    Follow Up Recommendations SNF    Equipment Recommendations  Rolling walker with 5" wheels;Other (comment) (unless pt owns one)    Recommendations for Other Services       Precautions / Restrictions Precautions Precautions: Fall Precaution Comments: watch sats Restrictions Weight Bearing Restrictions: No      Mobility  Bed Mobility Overal bed mobility: Needs Assistance Bed Mobility: Sit to Supine       Sit to supine: Supervision (help with safety)   General bed mobility comments: HOB up  Transfers Overall transfer level: Needs assistance Equipment used: Rolling walker (2 wheeled);1 person hand held assist Transfers: Sit to/from Stand Sit to Stand: Min guard;Min assist Stand pivot transfers: Min assist;Min guard       General transfer comment:  (back to bed to go get imaging done)  Ambulation/Gait Ambulation/Gait assistance: Min guard;Min assist Ambulation Distance (Feet): 3 Feet Assistive device: Rolling walker (2 wheeled);1 person hand held assist Gait Pattern/deviations: Step-through pattern Gait velocity: reducec Gait velocity interpretation: Below normal speed for age/gender    Stairs            Wheelchair Mobility    Modified Rankin (Stroke Patients Only)       Balance Overall balance  assessment: Needs assistance Sitting-balance support: Feet supported Sitting balance-Leahy Scale: Good   Postural control: Posterior lean Standing balance support: Bilateral upper extremity supported Standing balance-Leahy Scale: Fair Standing balance comment: fair- with dynamic activity                             Pertinent Vitals/Pain Pain Assessment: Faces Faces Pain Scale: Hurts little more Pain Location: chest Pain Descriptors / Indicators: Burning Pain Intervention(s): Limited activity within patient's tolerance;Monitored during session    Home Living Family/patient expects to be discharged to:: Private residence Living Arrangements: Spouse/significant other;Children;Other relatives Available Help at Discharge: Family;Available PRN/intermittently Type of Home: House Home Access: Stairs to enter Entrance Stairs-Rails: Right Entrance Stairs-Number of Steps: 3 Home Layout: Two level;Able to live on main level with bedroom/bathroom Home Equipment: Bedside commode;Shower seat;Walker - 2 wheels;Cane - single point;Grab bars - tub/shower;Crutches;Other (comment) (oxygen)      Prior Function Level of Independence: Independent (husband states she has not needed AD recently)               Hand Dominance   Dominant Hand: Right    Extremity/Trunk Assessment   Upper Extremity Assessment: Generalized weakness           Lower Extremity Assessment: Generalized weakness      Cervical / Trunk Assessment: Kyphotic  Communication   Communication: No difficulties  Cognition Arousal/Alertness: Awake/alert Behavior During Therapy: Flat affect Overall Cognitive Status: Within Functional Limits for tasks assessed  General Comments General comments (skin integrity, edema, etc.): Pt will be up to walk with PT assist and did get to chair with nursing. However she is fatigued and limited with breathing coarse, congested cough.  Will  need some recovery to get home with husband if she is to be successful.    Exercises        Assessment/Plan    PT Assessment Patient needs continued PT services  PT Diagnosis Generalized weakness   PT Problem List Decreased strength;Decreased range of motion;Decreased activity tolerance;Decreased balance;Decreased mobility;Decreased coordination;Decreased knowledge of use of DME;Decreased safety awareness;Cardiopulmonary status limiting activity;Decreased skin integrity;Pain  PT Treatment Interventions DME instruction;Gait training;Functional mobility training;Stair training;Balance training;Therapeutic exercise;Therapeutic activities;Neuromuscular re-education;Patient/family education   PT Goals (Current goals can be found in the Care Plan section) Acute Rehab PT Goals Patient Stated Goal: to finish testing PT Goal Formulation: With patient/family Time For Goal Achievement: 06/12/15 Potential to Achieve Goals: Good    Frequency Min 2X/week   Barriers to discharge Inaccessible home environment;Decreased caregiver support Pt is unable to maintain O2 sats on 3L O2 and resting was 83% then 87% to 89% with standing activity    Co-evaluation               End of Session Equipment Utilized During Treatment: Oxygen (3L via nasal cannula) Activity Tolerance: Treatment limited secondary to medical complications (Comment);Patient limited by fatigue;Other (comment) (Low O2 sats with activity, 83% when PT arrived) Patient left: in bed;with call bell/phone within reach;with nursing/sitter in room;Other (comment) (imminently leaving for swallow study) Nurse Communication: Mobility status         Time: 4562-5638 PT Time Calculation (min) (ACUTE ONLY): 32 min   Charges:   PT Evaluation $Initial PT Evaluation Tier I: 1 Procedure PT Treatments $Therapeutic Activity: 8-22 mins   PT G Codes:        Ramond Dial 31-May-2015, 3:05 PM   Mee Hives, PT MS Acute Rehab Dept. Number:  [facility] [id number] and [place] [phone]

## 2015-05-29 NOTE — Consult Note (Signed)
Name: Brenda [name] MRN: [id number] DOB: Sep 17, 1950    ADMISSION DATE:  05/28/2015 CONSULTATION DATE:  05/29/15  REFERRING MD :  Dr. Grandville Silos   CHIEF COMPLAINT:  Recurrent PNA   BRIEF PATIENT DESCRIPTION: 64 y/o F, smoker, with PMH of severe COPD (followed by Dr. Lake Bells) and recent treatment for PNA who was admitted on 12/25 with complaints of fever (101), increasing SOB, and cough.  Initial CXR concerning for PNA.  PCCM consulted for evaluation.    SIGNIFICANT EVENTS  12/25  Admit with concern for recurrent PNA  STUDIES:  12/26  CT Chest >> bilateral emphysematous changes, multifocal infiltrate & peripheral interstitial thickening in RML, LLL laterally and RLL posterior laterally, stable subcentimeter nodules bilaterally in RUL, largest measures 49m, no mediastinal or hilar adenopathy, mild degenerative changes of the spine.    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 MLake Bells and recent treatment for PNA (04/2015 LLL, Rx'd with 10 days levaquin) who was admitted on 12/25 with complaints of fever (101), increasing SOB, and predominately dry cough.  Initial CXR concerning for multi-lobar PNA.   The patient was last seen 12/15 in office with a follow up CXR at that time which demonstrated a mild increase in interstitial prominence but clearing of L infiltrate.  She was treated for thrush with diflucan.  She reports 3 prior episodes of PNA in the last year.    [facility] a one week history of non-productive cough, shortness of breath with exertion, runny nose and fever (101).  On presentation, her room air saturations were 74% on RA, 83% on 4L.  She attempted use of albuterol and prednisone from an old prescription with partial relief.  The patient reports she was doing fairly  well on 12/15 when she saw Dr. MLake Bellsin the office.  However on the 17th she began having worsening shortness of breath.  They had discussed possible sinus disease as a contributing factor and she attempted to rinse her sinuses.  After, she had a small amount of green drainage but then her nose began running "constantly" for four days.  She reports one isolated fever of 101 with chills but denies body aches / flu like symptoms.  The patient also reports frequent difficulty with swallowing foods.  She describes food "getting hung up" sometimes as much as twice per week with projectile vomiting when it does not go down.    Initial labs - Na 131, K 4.5, Cl 93, sr cr 1.05, glucose 296, alkaline phos 187, lactic acid 0.95, PCT 1.98, WBC 7.8, hgb 11.9, and platelets 218.  The patient was cultured and empirically treated with IV abx.   PCCM consulted for evaluation of PNA.     PAST MEDICAL HISTORY :   has a past medical history of History of DVT (deep vein thrombosis); Coronary artery disease; COPD (chronic obstructive pulmonary disease) ([place]; Transient ischemic attack; Depression; radiation therapy (01/16/09 - 03/06/09); [place]ipheral [place] disease ([place]; Pneumonia; Hypercholesteremia; Chronic bronchitis ([place]; Shortness of breath; Type II diabetes mellitus ([place]; History of blood transfusion (1955); GERD (gastroesophageal reflux disease); H/O hiatal hernia; [place]roke ([place] (2000); Arthritis; Cancer of base of tongue ([place] (11/04[place]); Myocardial infarction ([facility] (2005); Fatigue (08/23/2013); and Carotid artery occlusion.  has past surgical history that includes TEE without cardioversion (  07/26/2011); Esophagogastroduodenoscopy (12/20/2011); Savory dilation (12/20/2011); Tonsillectomy (1969); Appendectomy (1962); Cardiac catheterization (1999; 11/26/2012); Coronary angioplasty with stent (9147--8295); Femoral bypass (1999; 2000); Carpal tunnel release (Right, 1980's?); Vaginal hysterectomy (1988); Cesarean section (1978);  Tubal ligation (1988); Cataract extraction w/ intraocular lens implant (Right, 2011); Ankle fracture surgery (Right, 2007); Bypass graft popliteal to popliteal (Right, 02/05/2013); and Eye surgery.    Prior to Admission medications   Medication Sig Start Date End Date Taking? Authorizing Provider  acetaminophen (TYLENOL) 500 MG tablet Take 1,000 mg by mouth every 6 (six) hours as needed for pain.   Yes Historical Provider, MD  albuterol (PROVENTIL HFA;VENTOLIN HFA) 108 (90 BASE) MCG/ACT inhaler Inhale 2 puffs into the lungs every 6 (six) hours as needed for wheezing or shortness of breath. 01/20/15  Yes Belkys A Regalado, MD  aspirin EC 81 MG tablet Take 81 mg by mouth daily.   Yes Historical Provider, MD  buPROPion (WELLBUTRIN XL) 150 MG 24 hr tablet Take 1 tablet (150 mg total) by mouth daily. 08/18/14  Yes Allie Bossier, MD  clopidogrel (PLAVIX) 75 MG tablet Take 75 mg by mouth daily.    Yes Historical Provider, MD  docusate sodium (COLACE) 100 MG capsule Take 100 mg by mouth daily.   Yes Historical Provider, MD  fish oil-omega-3 fatty acids 1000 MG capsule Take 1 g by mouth 2 (two) times daily.    Yes Historical Provider, MD  Fluticasone Furoate-Vilanterol (BREO ELLIPTA IN) Inhale 1 puff into the lungs daily.   Yes Historical Provider, MD  insulin glargine (LANTUS) 100 UNIT/ML injection Inject 0.2 mLs (20 Units total) into the skin daily. Patient taking differently: Inject 20 Units into the skin 2 (two) times daily.  01/20/15  Yes Belkys A Regalado, MD  insulin lispro (HUMALOG) 100 UNIT/ML injection Inject 3 Units into the skin 3 (three) times daily with meals as needed for high blood sugar.    Yes Historical Provider, MD  Multiple Vitamins-Minerals (MULTIVITAMIN & MINERAL PO) Take 1 tablet by mouth daily.   Yes Historical Provider, MD  nitroGLYCERIN (NITROLINGUAL) 0.4 MG/SPRAY spray Place 1 spray under the tongue every 5 (five) minutes as needed for chest pain. 01/20/15  Yes Belkys A Regalado, MD    omeprazole (PRILOSEC) 20 MG capsule Take 20 mg by mouth daily.    Yes Historical Provider, MD  rosuvastatin (CRESTOR) 20 MG tablet Take 20 mg by mouth daily.   Yes Historical Provider, MD  Tiotropium Bromide Monohydrate (SPIRIVA RESPIMAT) 2.5 MCG/ACT AERS Inhale 2 puffs into the lungs daily. 12/19/14  Yes Juanito Doom, MD  fluconazole (DIFLUCAN) 100 MG tablet Take 1 tablet (100 mg total) by mouth once. Patient not taking: Reported on 05/28/2015 05/18/15   Juanito Doom, MD   Allergies  Allergen Reactions  . Tape Hives and Other (See Comments)    USE PAPER TAPE ONLY- Adhesive peels off skin-makes pt. Raw.  Maryjo Rochester [Nicotine] Rash    To the PATCH only, breakouts on skin    FAMILY HISTORY:  family history includes Cancer in her mother and sister; Cancer (age of onset: 76) in her father; Clotting disorder in her brother; Deep vein thrombosis in her brother; Diabetes in her brother, mother, sister, and son; Hearing loss in her brother; Heart attack in her brother and father; Heart disease in her father; Hyperlipidemia in her brother, mother, and son; Hypertension in her brother and mother; Other in her mother.   SOCIAL HISTORY:  reports that she has been smoking Cigarettes.  She has a 50 pack-year smoking history. She has never used smokeless tobacco. She reports that she drinks about 3.0 oz of alcohol per week. She reports that she does not use illicit drugs.  REVIEW OF SYSTEMS:   Constitutional: Negative for weight loss, malaise/fatigue and diaphoresis. Reports fever, chills HENT: Negative for hearing loss, ear pain, nosebleeds, congestion, sore throat, neck pain, tinnitus and ear discharge.   Eyes: Negative for blurred vision, double vision, photophobia, pain, discharge and redness.  Respiratory: Negative for hemoptysis, wheezing and stridor.  Reports cough with minimal sputum production, shortness of breath with exertion Cardiovascular: Negative for chest pain, palpitations,  orthopnea, claudication, leg swelling and PND.  Gastrointestinal: Negative for heartburn, nausea, vomiting, abdominal pain, diarrhea, constipation, blood in stool and melena.  Genitourinary: Negative for dysuria, urgency, frequency, hematuria and flank pain.  Musculoskeletal: Negative for myalgias, back pain, joint pain and falls.  Skin: Negative for itching and rash.  Neurological: Negative for dizziness, tingling, tremors, sensory change, speech change, focal weakness, seizures, loss of consciousness, weakness and headaches.  Endo/Heme/Allergies: Negative for environmental allergies and polydipsia. Does not bruise/bleed easily.  SUBJECTIVE:   VITAL SIGNS: Temp:  [97.4 F (36.3 C)-98.7 F (37.1 C)] 97.4 F (36.3 C) (12/26 0507) Pulse Rate:  [71-106] 71 (12/26 0821) Resp:  [11-22] 19 (12/26 0821) BP: (92-132)/(40-68) 95/52 mmHg (12/26 0507) SpO2:  [88 %-100 %] 98 % (12/26 0821) Weight:  [128 lb (58.06 kg)] 128 lb (58.06 kg) (12/25 1138)  PHYSICAL EXAMINATION: General:  Chronically ill appearing female in NAD Neuro:  AAOx4, speech clear, MAE HEENT:  MM pink/moist, no jvd Cardiovascular:  s1s2 rrr, no m/r/g  Lungs:  Prolonged exp phase, lungs bilaterally with faint wheezing, diminished air movement Abdomen:  NTND, bsx4 active  Musculoskeletal:  No acute deformities Skin:  Warm/dry, no rashes or lesions, no edema    Recent Labs Lab 05/28/15 1156 05/29/15 0602  NA 131* 135  K 4.5 4.5  CL 93* 102  CO2 30 23  BUN 18 14  CREATININE 1.05* 0.65  GLUCOSE 296* 252*    Recent Labs Lab 05/28/15 1156 05/29/15 0602  HGB 13.8 11.9*  HCT 41.6 37.6  WBC 20.2* 7.8  PLT 274 218   Dg Chest Portable 1 View  05/28/2015  CLINICAL DATA:  64 year old female with chest tightness and shortness of breath as well as fever EXAM: PORTABLE CHEST 1 VIEW COMPARISON:  Prior chest x-ray 05/18/2015 FINDINGS: Patchy airspace opacities in both lower lobes. The cardiac and mediastinal contours are  within normal limits. Trace atherosclerotic calcifications present in the transverse aorta. A background of chronic interstitial prominence and bronchitic changes similar compared to prior. No definite pleural effusion or pneumothorax. No acute osseous abnormality. IMPRESSION: Interval development of patchy airspace opacities in both lung bases concerning for possible multi lobar pneumonia. Electronically Signed   By: Jacqulynn Cadet M.D.   On: 05/28/2015 12:02     CULTURES:  Flu Panel 12/25 >> neg U. Strep 12/25 >> positive  U. [place] 12/25 >>    ASSESSMENT / PLAN:  Acute on Chronic Hypoxic Respiratory Failure - baseline 3-4L dependent Multilobar PNA (streptococcal) - recurrent, strep antigen positive this admission.  ? Aspiration component as well with recurrent PNA.  Previous concern for persistent left infiltrate. Tobacco Abuse   Plan: Encouraged use of O2, pt was wearing intermittently prior to November 2016 Wean O2 for sats 88-95% Follow cultures as above  Assess MBS given hx of swallowing difficulties  Narrow ABX to Rocephin  2gm IV Q24 Intermittent CXR HOLD FOB for now as pt already on abx. Consider assessment as an outpatient given hx of infiltrates.   Empiric tamiflu Smoking cessation counseling    Acute COPD Exacerbation   Plan: Pulmonary hygiene Pulmicort / Brovana Q12  Duonebs Q6 + PRN  Continue claritin  Wean steroids to off over 7-10 days    Noe Gens, NP-C [facility] Pgr: [phone] or if no answer [phone] 05/29/2015, 11:41 AM

## 2015-05-29 NOTE — Evaluation (Signed)
Occupational Therapy Evaluation Patient Details Name: Brenda [name] MRN: [id number] DOB: 19-Jun-1950 Today's Date: 05/29/2015    History of Present Illness Pt admitted with acute on chronic respiratory failure.  PMH: recurrent PNA, COPD with home 02 (3L), CAD with stents, CVA, DM, smoker.   Clinical Impression   Pt was independent prior to admission.  Presents with generalized weakness and poor activity tolerance.  Min guard assist required for OOB to [facility] and then chair.  Currently on 3L 02.  Will follow acutely.   Follow Up Recommendations  No OT follow up    Equipment Recommendations  None recommended by OT    Recommendations for Other Services       Precautions / Restrictions Precautions Precautions: Fall Precaution Comments: watch sats Restrictions Weight Bearing Restrictions: No      Mobility Bed Mobility Overal bed mobility: Modified Independent             General bed mobility comments: HOB up  Transfers Overall transfer level: Needs assistance   Transfers: Sit to/from Stand;Stand Pivot Transfers Sit to Stand: Min guard Stand pivot transfers: Min guard       General transfer comment: x 2    Balance                                            ADL Overall ADL's : Needs assistance/impaired Eating/Feeding: Independent;Sitting   Grooming: Wash/dry hands;Set up;Sitting   Upper Body Bathing: Set up;Sitting   Lower Body Bathing: Min guard;Sit to/from stand   Upper Body Dressing : Set up;Sitting   Lower Body Dressing: Min guard;Sit to/from stand Lower Body Dressing Details (indicate cue type and reason): can don socks Toilet Transfer: Min guard;Stand-pivot;BSC   Toileting- Clothing Manipulation and Hygiene: Min guard;Sit to/from stand         General ADL Comments: pt with coughing episodes throughout session, non productive     Vision     Perception     Praxis      Pertinent Vitals/Pain Pain Assessment:  0-10 Pain Score: 4  Pain Location: chest Pain Descriptors / Indicators: Burning Pain Intervention(s): Limited activity within patient's tolerance;Monitored during session;Patient requesting pain meds-RN notified;Repositioned     Hand Dominance Right   Extremity/Trunk Assessment Upper Extremity Assessment Upper Extremity Assessment: Overall WFL for tasks assessed   Lower Extremity Assessment Lower Extremity Assessment: Defer to PT evaluation   Cervical / Trunk Assessment Cervical / Trunk Assessment: Kyphotic   Communication Communication Communication: No difficulties   Cognition Arousal/Alertness: Awake/alert Behavior During Therapy: Flat affect Overall Cognitive Status: Within Functional Limits for tasks assessed                     General Comments       Exercises       Shoulder Instructions      Home Living Family/patient expects to be discharged to:: Private residence Living Arrangements: Spouse/significant other;Children;Other relatives Available Help at Discharge: Family;Available PRN/intermittently Type of Home: House Home Access: Stairs to enter [employer] of Steps: 3 Entrance Stairs-Rails: Right Home Layout: Two level;Able to live on main level with bedroom/bathroom     Bathroom Shower/Tub: Teacher, early years/pre: Standard     Home Equipment: Bedside commode;Shower seat;Walker - 2 wheels;Cane - single point;Grab bars - tub/shower;Crutches (oxygen)          Prior Functioning/Environment Level  of Independence: Independent with assistive device(s)        Comments: uses cane occasionally    OT Diagnosis: Generalized weakness;Acute pain   OT Problem List: Decreased activity tolerance;Impaired balance (sitting and/or standing);Cardiopulmonary status limiting activity   OT Treatment/Interventions: Self-care/ADL training;Patient/family education;Energy conservation    OT Goals(Current goals can be found in the care  plan section) Acute Rehab OT Goals Patient Stated Goal: find out why she keeps having recurrent PNA OT Goal Formulation: With patient Time For Goal Achievement: 06/05/15 Potential to Achieve Goals: Good ADL Goals Pt Will Perform Grooming: with modified independence;standing Pt Will Perform Lower Body Bathing: with modified independence;sit to/from stand Pt Will Perform Lower Body Dressing: with modified independence;sit to/from stand Pt Will Transfer to Toilet: with modified independence;ambulating;regular height toilet Pt Will Perform Toileting - Clothing Manipulation and hygiene: with modified independence;sit to/from stand Pt Will Perform Tub/Shower Transfer: Tub transfer;with modified independence;ambulating;shower seat Additional ADL Goal #1: Pt will utilize energy conservation strategies in ADL and mobility independently.  OT Frequency: Min 2X/week   Barriers to D/C:            Co-evaluation              End of Session Equipment Utilized During Treatment: Oxygen (3L) Nurse Communication:  (ok to give sprite zero)  Activity Tolerance: Patient limited by fatigue Patient left: in chair;with call bell/phone within reach;with chair alarm set   Time: 1113-1150 OT Time Calculation (min): 37 min Charges:  OT General Charges $OT Visit: 1 Procedure OT Evaluation $Initial OT Evaluation Tier I: 1 Procedure OT Treatments $Self Care/Home Management : 8-22 mins G-Codes:    Malka So 05/29/2015, 12:52 PM  [phone]

## 2015-05-29 NOTE — Progress Notes (Signed)
RT was unavailable at time of CPT treatment. Will start again at next scheduled time.

## 2015-05-29 NOTE — Progress Notes (Signed)
Speech Pathology      MBSS complete. Full report located under chart review in imaging section.   Recommend:    Pt exhibited a moderate-severe motor based pharyngeal dysphagia characterized by decreased larygneal elevation and closure, reduced tongue base retraction leading to penetration and aspiration both during and after (from residual). Pt produces a inconsistent reflexive cough that is ineffective in clearing vocal cord penetrates. Chin tuck posture was ineffective to mitigate or prevent laryngeal compromise. Esophagus briefly scan revealing stasis (known dysmotility). Although increasing pharyngeal residue mildly, honey thick appeared to produce a significant decrease in aspiration during or after swallow. Verbal cues for second swallow decreases residue although aspiration risk increases if pt does not perform second swallow. Pt reports "thicker things are harder to go down and I need something thin to wash it down". Recommend the water protocol to allow pt thin water (only) after oral care in beween meals. ST will continue to follow. If pt reports difficulty with honey thick liquids, may need to consider allowing nectar.         Brenda [name] [place].Ed [employer] [phone]

## 2015-05-30 DIAGNOSIS — A419 Sepsis, unspecified organism: Secondary | ICD-10-CM

## 2015-05-30 LAB — GLUCOSE, CAPILLARY
GLUCOSE-CAPILLARY: 307 mg/dL — AB (ref 65–99)
GLUCOSE-CAPILLARY: 165 mg/dL — AB (ref 65–99)
GLUCOSE-CAPILLARY: 113 mg/dL — AB (ref 65–99)
Glucose-Capillary: 281 mg/dL — ABNORMAL HIGH (ref 65–99)

## 2015-05-30 LAB — BASIC METABOLIC PANEL
ANION GAP: 7 (ref 5–15)
BUN: 13 mg/dL (ref 6–20)
CALCIUM: 8.7 mg/dL — AB (ref 8.9–10.3)
CHLORIDE: 98 mmol/L — AB (ref 101–111)
CO2: 28 mmol/L (ref 22–32)
Creatinine, Ser: 0.68 mg/dL (ref 0.44–1.00)
GFR calc non Af Amer: >60 mL/min (ref >60)
GLUCOSE: 313 mg/dL — AB (ref 65–99)
Potassium: 4.7 mmol/L (ref 3.5–5.1)
Sodium: 133 mmol/L — ABNORMAL LOW (ref 135–145)

## 2015-05-30 LAB — LEGIONELLA ANTIGEN, URINE

## 2015-05-30 LAB — HEMOGLOBIN A1C
HEMOGLOBIN A1C: 8.8 % — AB (ref 4.8–5.6)
MEAN PLASMA GLUCOSE: 206 mg/dL

## 2015-05-30 LAB — HIV ANTIBODY (ROUTINE TESTING W REFLEX): HIV SCREEN 4TH GENERATION: NONREACTIVE

## 2015-05-30 LAB — PROCALCITONIN: Procalcitonin: 0.79 ng/mL

## 2015-05-30 MED ORDER — PREDNISONE 20 MG PO TABS
40.0000 mg | ORAL_TABLET | Freq: Every day | ORAL | Status: DC
  Administered 2015-05-31 – 2015-06-02 (×3): 40 mg via ORAL
  Filled 2015-05-30 (×3): qty 2

## 2015-05-30 NOTE — Clinical Social Work Note (Signed)
Clinical Social Work Assessment  Patient Details  Name: Brenda [name] MRN: [id number] Date of Birth: 1950/08/21  Date of referral:  05/30/15               Reason for consult:  Facility Placement                Permission sought to share information with:  Facility Sport and exercise psychologist, Family Supports Permission granted to share information::  Yes, Verbal Permission Granted  Name::     Brenda [name]  Agency::  [facility]  Relationship::  Spouse  Contact Information:     Housing/Transportation Living arrangements for the past 2 months:  [place] of Information:  Patient Patient Interpreter Needed:  None Criminal Activity/Legal Involvement Pertinent to Current Situation/Hospitalization:  No - Comment as needed Significant Relationships:  Spouse, Adult Children Lives with:  Spouse Do you feel safe going back to the place where you live?  No Need for family participation in patient care:  Yes (Comment)  Care giving concerns:  CSW received referral for possible SNF placement at time of discharge. CSW met with patient regarding PT recommendation of SNF placement at time of discharge. Per patient, patient and patient's home may be currently unable to care for patient at their home given patient's current physical needs and fall risk. Patient expressed understanding of PT recommendation and may be agreeable to SNF placement at time of discharge. CSW to continue to follow and assist with discharge planning needs.   Social Worker assessment / plan:  CSW spoke with patient concerning possibility of rehab at [facility] before returning home.  Employment status:  Retired Nurse, adult PT Recommendations:  [place] / Referral to community resources:  [place]  Patient/Family's Response to care:  Patient recognizes need for rehab before returning home and may be agreeable to a SNF in [place].    Patient/Family's Understanding of and Emotional Response to Diagnosis, Current Treatment, and Prognosis:  Patient is realistic regarding therapy needs. No questions/concerns about plan or treatment.    Emotional Assessment Appearance:  Appears stated age Attitude/Demeanor/Rapport:   (Appropriate) Affect (typically observed):  Accepting, Appropriate Orientation:  Oriented to Self, Oriented to Place, Oriented to  Time, Oriented to Situation Alcohol / Substance use:  Not Applicable Psych involvement (Current and /or in the community):  No (Comment)  Discharge Needs  Concerns to be addressed:  Care Coordination Readmission within the last 30 days:  No Current discharge risk:  None Barriers to Discharge:  Continued Medical Work up   [employer], [place] 05/30/2015, 6:22 PM

## 2015-05-30 NOTE — Progress Notes (Signed)
Palliative consult received by EDP on admission. We have been following her disease/admission trajectory in the chart and appreciate clarity on nature of consult by Dr. Lake Bells today. She is indeed following an aggressive treatment route for her COPD and dysphagia but has clear functional status decline and frailty making her appropriate for early [facility] consultation. We are happy to address disease trajectory and to begin advance care planning discussions ie. HCPOA/AD/Code Status discussion as well as address symptom management. 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 Should also r/o esophageal candidiasis since she is on frequent inhaled/oral steroids and antibiotics for her COPD. She does not appear to have any pain issues. Dyspnea persists as her baseline limiting her activity significantly.  Will see her prior to her discharge and introduce the concept of palliative care and see how we can help her moving forward.  Lane Hacker, DO Palliative Medicine [phone]

## 2015-05-30 NOTE — Progress Notes (Signed)
Speech Language Pathology Treatment: Dysphagia  Patient Details Name: Brenda [name] MRN: [id number] DOB: 12/06/1950 Today's Date: 05/30/2015 Time: 6440-3474 SLP Time Calculation (min) (ACUTE ONLY): 13 min  Assessment / Plan / Recommendation Clinical Impression  Brief interaction with pt re: po's since yesterday's MBS. She reports "I have to have water to get the second swallow." SLP advises thin water between meals, not during but pt states she has to have it. Discussed risks. Reminded her that she initiated a second swallow, without a sip, during the MBS. SLP will attempt to see tomorrow with a meal to demonstrate strategies with her in a functional situation.    HPI HPI: 64 y.o. 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 Chest x-ray was concerning for interval development of patchy opacities in both lung bases concerning for possible multilobar pneumonia. 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      SLP Plan  Continue with current plan of care     Recommendations  Diet recommendations: Dysphagia 3 (mechanical soft);Honey-thick liquid (water protocol) Liquids provided via: Cup;No straw Medication Administration: Whole meds with puree Compensations: Slow rate;Small sips/bites;Multiple dry swallows after each bite/sip;Follow solids with liquid;Clear throat intermittently Postural Changes and/or Swallow Maneuvers: Seated upright 90 degrees;Upright  30-60 min after meal              Oral Care Recommendations: Oral care BID Follow up Recommendations:  (TBD) Plan: Continue with current plan of care   Houston Siren 05/30/2015, 3:04 PM  Orbie Pyo Colvin Caroli.Ed [employer] [phone]

## 2015-05-30 NOTE — NC FL2 (Signed)
[place] LEVEL OF CARE SCREENING TOOL     IDENTIFICATION  Patient Name: Brenda [name] Birthdate: January 30, 1951 Sex: female Admission Date (Current Location): 05/28/2015  [facility] and [place] Number:  Herbalist and Address:  [facility] [place], [place] [address] [place],  [address]      Provider Number: [id number]  Attending Physician Name and Address:  Debbe Odea, MD  Relative Name and Phone Number:  [name], husband    Current Level of Care: Hospital Recommended Level of Care: [place] Prior Approval Number:    Date Approved/Denied:   PASRR Number: [id number]  Discharge Plan: SNF    Current Diagnoses: Patient Active Problem List   Diagnosis Date Noted  . Sepsis ([place]) 05/28/2015  . Acute on chronic respiratory failure with hypoxia ([place]) 05/28/2015  . Recurrent pneumonia 05/28/2015  . Leukocytosis 05/28/2015  . Dehydration 05/28/2015  . Hyponatremia 05/28/2015  . Sepsis due to pneumonia ([place])   . Thrush 05/18/2015  . Pneumonia 01/17/2015  . Chronic hypoxemic respiratory failure ([place]) 11/04/2014  . Community acquired pneumonia   . COPD exacerbation ([place])   . Alcohol abuse   . CAP (community acquired pneumonia) 08/14/2014  . Chest pain at rest 08/14/2014  . Multiple open wounds of lower extremity 08/14/2014  . Pain of left lower extremity- and Right 11/23/2013  . Fatigue 08/23/2013  . Aftercare following surgery of the circulatory system, [place] 02/23/2013  . Peripheral vascular disease, unspecified ([place]) 01/26/2013  . Coronary artery disease-multiple stents- last cath 2007- Myoview low risk 4/13 12/24/2012  . COPD (chronic obstructive pulmonary disease) ([place]) 12/24/2012  . Claudication in peripheral vascular disease ([place]) 11/27/2012  . PVD-Hx of BFBPG '99 and '00. Dopplers OK 6/13   . Cancer of base of tongue- radiation 2010   . Hx of radiation therapy   . DM (diabetes mellitus), type 2, uncontrolled  ([place]) 07/23/2011  . TIA (transient ischemic attack)-2013 07/23/2011  . History of CVA (cerebrovascular accident) 07/23/2011  . Tobacco abuse 07/23/2011  . Hyperlipemia 07/23/2011    Orientation RESPIRATION BLADDER Height & Weight    Self, Time, Situation, Place  O2 (4L/min) Continent   128 lbs.  BEHAVIORAL SYMPTOMS/MOOD NEUROLOGICAL BOWEL NUTRITION STATUS   (N/A)  (N/A) Continent  (See DC Summary)  AMBULATORY STATUS COMMUNICATION OF NEEDS Skin   Extensive Assist Verbally Normal                       Personal Care Assistance Level of Assistance  Bathing, Feeding, Dressing Bathing Assistance: Maximum assistance Feeding assistance: Independent Dressing Assistance: Limited assistance     Functional Limitations Info             SPECIAL CARE FACTORS FREQUENCY  PT (By licensed PT)     PT Frequency: 5              Contractures      Additional Factors Info  Code Status, Allergies, Isolation Precautions, Insulin Sliding Scale Code Status Info: Full Allergies Info: Tape, Nicoderm   Insulin Sliding Scale Info: 3 times daily Isolation Precautions Info: MRSA     Current Medications (05/30/2015):  This is the current hospital active medication list Current Facility-Administered Medications  Medication Dose Route Frequency Provider Last Rate Last Dose  . acetaminophen (TYLENOL) tablet 650 mg  650 mg Oral Q4H PRN Eugenie Filler, MD      . arformoterol [facility]) nebulizer solution 15 mcg  15 mcg Nebulization  BID Eugenie Filler, MD   15 mcg at 05/30/15 [phone]  . aspirin EC tablet 81 mg  81 mg Oral Daily Eugenie Filler, MD   81 mg at 05/30/15 1044  . budesonide (PULMICORT) nebulizer solution 0.5 mg  0.5 mg Nebulization BID Donita Brooks, NP   0.5 mg at 05/30/15 0903  . buPROPion (WELLBUTRIN XL) 24 hr tablet 150 mg  150 mg Oral Daily Eugenie Filler, MD   150 mg at 05/30/15 1044  . cefTRIAXone (ROCEPHIN) 2 g in dextrose 5 % 50 mL IVPB  2 g Intravenous Q24H  Donita Brooks, NP   2 g at 05/30/15 1548  . clopidogrel (PLAVIX) tablet 75 mg  75 mg Oral Daily Eugenie Filler, MD   75 mg at 05/30/15 1043  . enoxaparin (LOVENOX) injection 40 mg  40 mg Subcutaneous Q24H Eugenie Filler, MD   40 mg at 05/29/15 1833  . feeding supplement (ENSURE ENLIVE) (ENSURE ENLIVE) liquid 237 mL  237 mL Oral BID BM Eugenie Filler, MD   237 mL at 05/29/15 1008  . guaiFENesin (MUCINEX) 12 hr tablet 1,200 mg  1,200 mg Oral BID Eugenie Filler, MD   1,200 mg at 05/30/15 1044  . insulin aspart (novoLOG) injection 0-20 Units  0-20 Units Subcutaneous TID WC Eugenie Filler, MD   15 Units at 05/30/15 1436  . insulin aspart (novoLOG) injection 0-5 Units  0-5 Units Subcutaneous QHS Eugenie Filler, MD   2 Units at 05/29/15 2125  . insulin aspart (novoLOG) injection 3 Units  3 Units Subcutaneous TID WC Eugenie Filler, MD   3 Units at 05/30/15 1436  . insulin glargine (LANTUS) injection 20 Units  20 Units Subcutaneous BID Eugenie Filler, MD   20 Units at 05/30/15 1044  . ipratropium-albuterol (DUONEB) 0.5-2.5 (3) MG/3ML nebulizer solution 3 mL  3 mL Nebulization Q6H Eugenie Filler, MD   3 mL at 05/30/15 0903  . ipratropium-albuterol (DUONEB) 0.5-2.5 (3) MG/3ML nebulizer solution 3 mL  3 mL Nebulization Q2H PRN Eugenie Filler, MD   3 mL at 05/29/15 2154  . loratadine (CLARITIN) tablet 10 mg  10 mg Oral Daily Eugenie Filler, MD   10 mg at 05/30/15 1044  . nitroGLYCERIN (NITROLINGUAL) 0.4 MG/SPRAY spray 1 spray  1 spray Sublingual Q5 min PRN Eugenie Filler, MD      . omega-3 acid ethyl esters (LOVAZA) capsule 1 g  1 g Oral BID Eugenie Filler, MD   1 g at 05/30/15 1044  . ondansetron (ZOFRAN) injection 4 mg  4 mg Intravenous Q6H PRN Eugenie Filler, MD      . pantoprazole (PROTONIX) EC tablet 40 mg  40 mg Oral QAC breakfast Eugenie Filler, MD   40 mg at 05/30/15 0909  . polyethylene glycol (MIRALAX / GLYCOLAX) packet 17 g  17 g Oral Daily Eugenie Filler, MD   17 g at 05/30/15 1047  . [START ON 05/31/2015] predniSONE (DELTASONE) tablet 40 mg  40 mg Oral Q breakfast Juanito Doom, MD      . RESOURCE THICKENUP CLEAR   Oral PRN Debbe Odea, MD      . rosuvastatin (CRESTOR) tablet 20 mg  20 mg Oral QHS Eugenie Filler, MD   20 mg at 05/29/15 2120     Discharge Medications: Please see discharge summary for a list of discharge medications.  Relevant Imaging Results:  Relevant Lab Results:  Additional Information SSN: [id number]  Benard Halsted, LCSWA

## 2015-05-30 NOTE — Care Management Note (Signed)
Case Management Note  Patient Details  Name: Brenda [name] MRN: [id number] Date of Birth: 12-29-1950  Subjective/Objective:                 Patient admitted with pneumonia. Patient receiving IV steriods and IV Abx. Patient is from home with her spouse, and with supplemental oxygen at 3L [place] and home health through [facility].     Action/Plan: Anticipate DC to SNF.     Action/Plan:   Expected Discharge Date:                  Expected Discharge Plan:  Skilled Nursing Facility  In-House Referral:  Clinical Social Work  Discharge planning Services  CM Consult  Post Acute Care Choice:    Choice offered to:     DME Arranged:    DME Agency:     HH Arranged:    [place] Agency:     Status of Service:  In process, will continue to follow  Medicare Important Message Given:    Date Medicare IM Given:    Medicare IM give by:    Date Additional Medicare IM Given:    Additional Medicare Important Message give by:     If discussed at [place] of Stay Meetings, dates discussed:    Additional Comments:  Carles Collet, RN 05/30/2015, 2:41 PM

## 2015-05-30 NOTE — Progress Notes (Signed)
Name: Brenda [name] MRN: [id number] DOB: 04-13-1951    ADMISSION DATE:  05/28/2015 CONSULTATION DATE:  05/29/15  REFERRING MD :  Dr. Grandville Silos   CHIEF COMPLAINT:  Recurrent PNA   BRIEF PATIENT DESCRIPTION: 64 y/o F, smoker, with severe COPD and recurrent pneumonia.    SIGNIFICANT EVENTS  12/25  Admit with concern for recurrent PNA  STUDIES:  12/26  CT Chest >> bilateral emphysematous changes, multifocal infiltrate & peripheral interstitial thickening in RML, LLL laterally and RLL posterior laterally, stable subcentimeter nodules bilaterally in RUL, largest measures 56m, no mediastinal or hilar adenopathy, mild degenerative changes of the spine.    SUBJECTIVE:   VITAL SIGNS: Temp:  [97.4 F (36.3 C)-98.4 F (36.9 C)] 98.2 F (36.8 C) (12/27 0514) Pulse Rate:  [71-95] 80 (12/27 0514) Resp:  [18-24] 18 (12/27 0514) BP: (100-113)/(49-61) 113/49 mmHg (12/27 0514) SpO2:  [96 %-100 %] 98 % (12/27 0903)  PHYSICAL EXAMINATION: General:  Chronically ill appearing, no acute distress Neuro:  A&Ox4, MAEW HEENT:  MMM, OP clear, neck supple Cardiovascular:  RRR, no mgr  Lungs:  Normal effort, wheezing bilaterally, rhonchi noted Abdomen: BS+, soft, non-tender, non-distended Musculoskeletal:  No acute deformities Skin:  Warm/dry, no rashes or lesions, no edema    Recent Labs Lab 05/28/15 1156 05/29/15 0602 05/30/15 0818  NA 131* 135 133*  K 4.5 4.5 4.7  CL 93* 102 98*  CO2 '30 23 28  '$ BUN '18 14 13  '$ CREATININE 1.05* 0.65 0.68  GLUCOSE 296* 252* 313*    Recent Labs Lab 05/28/15 1156 05/29/15 0602  HGB 13.8 11.9*  HCT 41.6 37.6  WBC 20.2* 7.8  PLT 274 218   Ct Chest W Contrast  05/29/2015  CLINICAL DATA:  Cough, recurrent pneumonia EXAM: CT CHEST WITH CONTRAST TECHNIQUE: Multidetector CT imaging of the chest was performed during intravenous contrast administration. CONTRAST:  726mOMNIPAQUE IOHEXOL 300 MG/ML  SOLN COMPARISON:  Chest x-ray 05/28/2015 and CT scan of the  chest 11/16/2014 FINDINGS: Sagittal images of the spine shows osteopenia and mild degenerative change thoracic spine. Central airways are patent. Images of the thoracic inlet are unremarkable. Mild atherosclerotic calcifications of thoracic aorta. A precarinal lymph node measures 7 mm short-axis stable from prior exam not pathologic by size criteria. No hilar adenopathy. Atherosclerotic calcifications of coronary arteries again noted. Atherosclerotic calcifications of abdominal aorta and splenic artery. No adrenal gland mass is noted in visualized upper abdomen. Mild fatty infiltration of the liver. There is small bilateral pleural effusion. Bilateral emphysematous changes again noted. Stable tiny bilateral pulmonary nodules the largest in axial image 16 measures 5 mm in right upper lobe stable in size in appearance from prior exam. As noted on recent chest x-ray there is bilateral peripheral interstitial thickening in right middle lobe, right lower lobe posterior laterally and left lower lobe anterolaterally please see axial image 47. There are superimposed patchy infiltrate/ alveolar airspace is right middle lobe medially right lower lobe posterior laterally and left lower lobe anterolaterally. Findings consistent with interstitial infiltrate/ multifocal pneumonia. IMPRESSION: 1. Bilateral emphysematous changes again noted. Multifocal infiltrate/pneumonia and peripheral interstitial thickening noted in right middle lobe left lower lobe laterally and right lower lobe posterior laterally. Follow-up to resolution after appropriate treatment is recommended. 2. Stable subcentimeter nodules bilaterally the largest in right upper lobe anteriorly measures 5 mm. 3. No mediastinal or hilar adenopathy. 4. Mild degenerative changes thoracic spine Electronically Signed   By: LiLahoma Crocker.D.   On: 05/29/2015 09:28  Dg Swallowing Func-speech Pathology  05/29/2015  Objective Swallowing Evaluation:   MBS Patient Details Name:  Brenda [name] MRN: [id number] Date of Birth: 07-04-1950 Today's Date: 05/29/2015 Time: SLP Start Time (ACUTE ONLY): 1418-SLP Stop Time (ACUTE ONLY): 1438 SLP Time Calculation (min) (ACUTE ONLY): 20 min Past Medical History: Past Medical History Diagnosis Date . History of DVT (deep vein thrombosis)  . Coronary artery disease    multiple stens . COPD (chronic obstructive pulmonary disease) ([place])  . Transient ischemic attack  . Depression  . Hx of radiation therapy 01/16/09 - 03/06/09   base of tongue, right neck node . Peripheral arterial disease ([name])    status post bilateral femoropopliteal bypass graft performed by Dr. Sherren Mocha early in the past with significant right lower extremity lifestyle limiting claudication . Pneumonia  . Hypercholesteremia  . Chronic bronchitis ([place])  . Shortness of breath    "w/exertion and sometimes when laying down" (11/26/2012) . Type II diabetes mellitus ([place])  . History of blood transfusion 1955 . GERD (gastroesophageal reflux disease)  . H/O hiatal hernia  . Stroke [facility]) 2000   left side weakness remains (11/26/2012) . Arthritis    "hands" (11/26/2012) . Cancer of base of tongue ([name]) 11/04/2008   "& lymph nodes @ right neck" (11/26/2012 . Myocardial infarction [facility]) 2005   dr Ellyn Hack  . Fatigue 08/23/2013 . Carotid artery occlusion  Past Surgical History: Past Surgical History Procedure Laterality Date . Tee without cardioversion  07/26/2011   Procedure: TRANSESOPHAGEAL ECHOCARDIOGRAM (TEE);  Surgeon: Pixie Casino, MD;  Location: [facility] ENDOSCOPY;  Service: Cardiovascular;  Laterality: N/A; . Esophagogastroduodenoscopy  12/20/2011   Procedure: ESOPHAGOGASTRODUODENOSCOPY (EGD);  Surgeon: Beryle Beams, MD;  Location: [facility] END[facility]COPY;  Service: Endoscopy;  Laterality: N/A;  EGD with balloon dilation . Savory dilation  12/20/2011   Procedure: SAVORY DILATION;  Surgeon: Beryle Beams, MD;  Location: [facility] ENDOSCOPY;  Service: Endoscopy;  Laterality: N/A; . Tonsillectomy  1969 . Appendectomy  1962 .  Cardiac catheterization  1999; 11/26/2012 . Coronary angioplasty with stent placement  1999--2005   "i think I have 3-5 total" (11/26/2012) . Femoral bypass  1999; 2000   notes 11/26/2012 . Carpal tunnel release Right 1980's? . Vaginal hysterectomy  1988 . Cesarean section  1978 . Tubal ligation  1988 . Cataract extraction w/ intraocular lens implant Right 2011 . Ankle fracture surgery Right 2007   "got a metal plate and 8 screws in it" (11/26/2012) . Bypass graft popliteal to popliteal Right 02/05/2013   Procedure: BYPASS GRAFT ABOVE KNEE POPLITEAL TO BELOW KNEE POPLITEAL WITH SMALL SAPHANOUS VEIN;  Surgeon: Rosetta Posner, MD;  Location[facility] OR;  Service: Vascular;  Laterality: Right; . Eye surgery   HPI: 64 y.o. 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 Chest x-ray was concerning for interval development of patchy opacities in both lung bases concerning for possible multilobar pneumonia. 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 No Data Recorded Assessment / Plan / Recommendation CHL IP CLINICAL IMPRESSIONS 05/29/2015 Therapy Diagnosis Moderate pharyngeal phase dysphagia;Severe  pharyngeal phase dysphagia Clinical Impression Pt exhibited a moderate-severe motor based pharyngeal dysphagia characterized by decreased larygneal elevation and closure, reduced tongue base retraction leading to penetration and aspiration both during and after (from residual). Pt produces a inconsistent  reflexive cough that is ineffective in clearing vocal cord penetrates. Chin tuck posture was ineffective to mitigate or prevent laryngeal compromise. Esophagus briefly scan revealing stasis (known dysmotility). Although increasing pharyngeal residue mildly, honey thick appeared to produce a significant decrease in aspiration during or after swallow. Verbal cues for second swallow decreases residue although aspiration risk increases if pt does not perform second swallow. Pt reports "thicker things are harder to go down and I need something thin to wash it down". Recommend the water protocol to allow pt thin water (only) after oral care in beween meals. ST will continue to follow. If pt reports difficulty with honey thick liquids, may need to consider allowing nectar.     Impact on safety and function Severe aspiration risk   CHL IP TREATMENT RECOMMENDATION 05/29/2015 Treatment Recommendations Therapy as outlined in treatment plan below   Prognosis 05/29/2015 Prognosis for Safe Diet Advancement Fair Barriers to Reach Goals Severity of deficits Barriers/Prognosis Comment -- CHL IP DIET RECOMMENDATION 05/29/2015 SLP Diet Recommendations Dysphagia 3 (Mech soft) solids;Honey thick liquids Liquid Administration via Cup Medication Administration Crushed with puree Compensations Slow rate;Small sips/bites;Multiple dry swallows after each bite/sip;Follow solids with liquid;Clear throat intermittently Postural Changes Remain semi-upright after after feeds/meals (Comment);Seated upright at 90 degrees   CHL IP OTHER RECOMMENDATIONS 05/29/2015 Recommended Consults -- Oral Care Recommendations Oral care BID Other Recommendations Order thickener from pharmacy   CHL IP FOLLOW UP RECOMMENDATIONS 05/29/2015 Follow up Recommendations (No Data)   CHL IP FREQUENCY AND DURATION 05/29/2015 Speech Therapy Frequency (ACUTE ONLY) min 2x/week Treatment Duration 2 weeks      CHL IP ORAL PHASE 05/29/2015 Oral Phase Impaired Oral - Pudding  Teaspoon -- Oral - Pudding Cup -- Oral - Honey Teaspoon -- Oral - Honey Cup -- Oral - Nectar Teaspoon -- Oral - Nectar Cup -- Oral - Nectar Straw -- Oral - Thin Teaspoon -- Oral - Thin Cup -- Oral - Thin Straw -- Oral - Puree -- Oral - Mech Soft -- Oral - Regular Delayed oral transit Oral - Multi-Consistency -- Oral - Pill -- Oral Phase - Comment --  CHL IP PHARYNGEAL PHASE 05/29/2015 Pharyngeal Phase Impaired Pharyngeal- Pudding Teaspoon -- Pharyngeal -- Pharyngeal- Pudding Cup -- Pharyngeal -- Pharyngeal- Honey Teaspoon -- Pharyngeal -- Pharyngeal- Honey Cup Pharyngeal residue - pyriform;Pharyngeal residue - valleculae;Reduced tongue base retraction;Reduced laryngeal elevation;Reduced airway/laryngeal closure Pharyngeal -- Pharyngeal- Nectar Teaspoon -- Pharyngeal -- Pharyngeal- Nectar Cup Penetration/Apiration after swallow;Reduced airway/laryngeal closure;Reduced laryngeal elevation;Pharyngeal residue - pyriform;Pharyngeal residue - valleculae;Reduced tongue base retraction Pharyngeal Material enters airway, passes BELOW cords without attempt by patient to eject out (silent aspiration);Material enters airway, passes BELOW cords and not ejected out despite cough attempt by patient Pharyngeal- Nectar Straw -- Pharyngeal -- Pharyngeal- Thin Teaspoon -- Pharyngeal -- Pharyngeal- Thin Cup Penetration/Aspiration during swallow;Reduced airway/laryngeal closure;Pharyngeal residue - valleculae;Pharyngeal residue - pyriform;Reduced laryngeal elevation;Reduced tongue base retraction;Penetration/Apiration after swallow Pharyngeal Material enters airway, passes BELOW cords without attempt by patient to eject out (silent aspiration) Pharyngeal- Thin Straw -- Pharyngeal -- Pharyngeal- Puree NT Pharyngeal -- Pharyngeal- Mechanical Soft -- Pharyngeal -- Pharyngeal- Regular Pharyngeal residue - valleculae;Reduced tongue base retraction Pharyngeal -- Pharyngeal- Multi-consistency -- Pharyngeal -- Pharyngeal- Pill -- Pharyngeal --  Pharyngeal Comment --  CHL IP CERVICAL ESOPHAGEAL PHASE  05/29/2015 Cervical Esophageal Phase WFL Pudding Teaspoon -- Pudding Cup -- Honey Teaspoon -- Honey Cup -- Nectar Teaspoon -- Nectar Cup -- Nectar Straw -- Thin Teaspoon -- Thin Cup -- Thin Straw -- Puree -- Mechanical Soft -- Regular -- Multi-consistency -- Pill -- Cervical Esophageal Comment -- [place] Siren 05/29/2015, 3:36 PM Orbie Pyo Colvin Caroli.Ed [employer] [phone]                CULTURES:  Flu Panel 12/25 >> neg U. Strep 12/25 >> positive  U. [place] 12/25 >>    ASSESSMENT / PLAN:  Acute on Chronic Hypoxic Respiratory Failure - baseline 3-4L dependent Recurrent Multilobar PNA (streptococcal) - due to aspiration  Tobacco Abuse  > ongoing Dysphagia with recurrent aspiration pneumonia Esophageal dysmotility  Plan: Counsel to quit smoking Appreciate SLP help, hopefully we can find a diet that will minimze aspiration Needs to follow up with Dr. Benson Norway with GI after discharge for dysmotility Titrate O2 to O2 saturation > 90%   Acute COPD Exacerbation   Plan: Pulmonary hygiene Continue guaifenesin Instructed on flutter use today Stop solumedrol, start prednisone tomorrow Pulmicort / Brovana Q12  Duonebs Q6 + PRN  Continue claritin    Palliative care? > consult noted in chart.  I talked to her daughter about this at length on the phone.  I think that it's not unreasonable to consider this but as we have just recognized her dysphagia would prefer to attempt SLP treatment and GI treatment (if available) for her dysphagia and dysmotility before considering a pure palliative route.  However palliative input is always appreciated and if they can offer changes to her care for symptom relief I would welcome it.  Roselie Awkward, MD [facility] Pager: [phone] Cell: [phone] After 3pm or if no response, call [phone]

## 2015-05-30 NOTE — Progress Notes (Signed)
[facility] Progress Note   Brenda [name]  YBO:[id number]  DOB: 24-Aug-1950  DOA: 05/28/2015 PCP: [name], MD  Brief narrative: Brenda [name] is a 64 y.o. 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    Subjective: Still has a bad cough and dyspnea with exertion. Discussed MBS findings and need for thickened liquids. She is in agreement.   Assessment/Plan: Principal Problem:   Acute on chronic respiratory failure with hypoxia- bilateral pneumonia- COPD exacerbation - MBS shows high aspiration risk and honey hick liquids recommended- this is likely the cause of recurrent pneumonias but she is also strep pneumo antigen postive- cont Rocephin per pulmonary -cont Nebs and solumedrol - influenza negative- stopped tamiflu   Active Problems:   DM (diabetes mellitus), type 2, uncontrolled - cont Lantus and mealtime insulin    History of CVA (cerebrovascular accident) and CAD s/p stents -cont ASA, Plavix and statin  Hyponatremia - improved  AKI - improved   Code Status:     Code Status Orders        Start     Ordered   05/28/15 1529  Full code   Continuous     05/28/15 1529     Family Communication: husband Disposition Plan:   DVT prophylaxis: Lovenox Consultants: PCCM Procedures: Antibiotics: Anti-infectives    Start     Dose/Rate Route Frequency Ordered Stop   05/29/15 1300  cefTRIAXone (ROCEPHIN) 2 g in dextrose 5 % 50 mL IVPB     2 g 100 mL/hr over 30 Minutes Intravenous Every 24 hours 05/29/15 1138     05/29/15 1230  cefTRIAXone (ROCEPHIN) 1 g in dextrose 5 % 50 mL IVPB  Status:  Discontinued     1 g 100 mL/hr over 30 Minutes Intravenous Every 24 hours 05/28/15 1237 05/28/15 1529   05/29/15 1230  azithromycin (ZITHROMAX) 500 mg in dextrose 5 % 250 mL IVPB  Status:  Discontinued     500 mg 250 mL/hr over 60  Minutes Intravenous Every 24 hours 05/28/15 1237 05/29/15 1138   05/29/15 0430  vancomycin (VANCOCIN) 500 mg in sodium chloride 0.9 % 100 mL IVPB  Status:  Discontinued     500 mg 100 mL/hr over 60 Minutes Intravenous Every 12 hours 05/28/15 1536 05/29/15 1138   05/28/15 2200  oseltamivir (TAMIFLU) capsule 75 mg  Status:  Discontinued     75 mg Oral 2 times daily 05/28/15 1529 05/29/15 1652   05/28/15 1630  vancomycin (VANCOCIN) IVPB 1000 mg/200 mL premix     1,000 mg 200 mL/hr over 60 Minutes Intravenous  Once 05/28/15 1536 05/28/15 1835   05/28/15 1630  ceFEPIme (MAXIPIME) 1 g in dextrose 5 % 50 mL IVPB  Status:  Discontinued     1 g 100 mL/hr over 30 Minutes Intravenous Every 24 hours 05/28/15 1536 05/29/15 1138   05/28/15 1530  azithromycin (ZITHROMAX) 500 mg in dextrose 5 % 250 mL IVPB  Status:  Discontinued     500 mg 250 mL/hr over 60 Minutes Intravenous Every 24 hours 05/28/15 1529 05/28/15 1538   05/28/15 1230  cefTRIAXone (ROCEPHIN) 1 g in dextrose 5 % 50 mL IVPB     1 g 100 mL/hr over 30 Minutes Intravenous  Once 05/28/15 1229 05/28/15 1320   05/28/15 1230  azithromycin (ZITHROMAX) 500 mg in dextrose 5 %  250 mL IVPB     500 mg 250 mL/hr over 60 Minutes Intravenous  Once 05/28/15 1229 05/28/15 1412      Objective: Filed Weights   05/28/15 1138  Weight: 58.06 kg (128 lb)    Intake/Output Summary (Last 24 hours) at 05/30/15 1212 Last data filed at 05/30/15 0900  Gross per 24 hour  Intake    120 ml  Output    800 ml  Net   -680 ml     Vitals Filed Vitals:   05/29/15 2115 05/29/15 2205 05/30/15 0514 05/30/15 0903  BP: 100/49 112/57 113/49   Pulse: 89 95 80   Temp: 97.4 F (36.3 C) 97.7 F (36.5 C) 98.2 F (36.8 C)   TempSrc: Oral Oral Oral   Resp: '20 24 18   '$ Height:      Weight:      SpO2: 96% 96% 98% 98%    Exam:  General:  Pt is alert, not in acute distress  HEENT: No icterus, No thrush, oral mucosa moist  Cardiovascular: regular rate and rhythm,  S1/S2 No murmur  Respiratory: b/l rhonchi  Abdomen: Soft, +Bowel sounds, non tender, non distended, no guarding  MSK: No LE edema, cyanosis or clubbing  Data Reviewed: Basic Metabolic Panel:  Recent Labs Lab 05/28/15 1156 05/28/15 1710 05/29/15 0602 05/30/15 0818  NA 131*  --  135 133*  K 4.5  --  4.5 4.7  CL 93*  --  102 98*  CO2 30  --  23 28  GLUCOSE 296*  --  252* 313*  BUN 18  --  14 13  CREATININE 1.05*  --  0.65 0.68  CALCIUM 8.7*  --  8.2* 8.7*  MG  --  1.7  --   --    Liver Function Tests:  Recent Labs Lab 05/28/15 1156 05/29/15 0602  AST 16 31  ALT 14 22  ALKPHOS 187* 186*  BILITOT 1.2 0.9  PROT 5.7* 5.1*  ALBUMIN 2.3* 2.1*   No results for input(s): LIPASE, AMYLASE in the last 168 hours. No results for input(s): AMMONIA in the last 168 hours. CBC:  Recent Labs Lab 05/28/15 1156 05/29/15 0602  WBC 20.2* 7.8  NEUTROABS 19.2* 7.5  HGB 13.8 11.9*  HCT 41.6 37.6  MCV 91.8 93.1  PLT 274 218   Cardiac Enzymes: No results for input(s): CKTOTAL, CKMB, CKMBINDEX, TROPONINI in the last 168 hours. BNP (last 3 results) No results for input(s): BNP in the last 8760 hours.  ProBNP (last 3 results) No results for input(s): PROBNP in the last 8760 hours.  CBG:  Recent Labs Lab 05/29/15 0801 05/29/15 1158 05/29/15 1608 05/29/15 2114 05/30/15 0753  GLUCAP 209* 194* 335* 236* 281*    Recent Results (from the past 240 hour(s))  Blood Culture (routine x 2)     Status: None (Preliminary result)   Collection Time: 05/28/15 12:02 PM  Result Value Ref Range Status   Specimen Description BLOOD LEFT ANTECUBITAL  Final   Special Requests BOTTLES DRAWN AEROBIC AND ANAEROBIC 5MLS  Final   Culture NO GROWTH 2 DAYS  Final   Report Status PENDING  Incomplete  Blood Culture (routine x 2)     Status: None (Preliminary result)   Collection Time: 05/28/15 12:50 PM  Result Value Ref Range Status   Specimen Description BLOOD RIGHT ANTECUBITAL  Final   Special  Requests BOTTLES DRAWN AEROBIC AND ANAEROBIC 10MLS  Final   Culture NO GROWTH 2 DAYS  Final  Report Status PENDING  Incomplete  Urine culture     Status: None   Collection Time: 05/28/15  3:29 PM  Result Value Ref Range Status   Specimen Description URINE, RANDOM  Final   Special Requests ADDED 2222  Final   Culture NO GROWTH 1 DAY  Final   Report Status 05/29/2015 FINAL  Final  MRSA PCR Screening     Status: None   Collection Time: 05/28/15  6:05 PM  Result Value Ref Range Status   MRSA by PCR NEGATIVE NEGATIVE Final    Comment:        The GeneXpert MRSA Assay (FDA approved for NASAL specimens only), is one component of a comprehensive MRSA colonization surveillance program. It is not intended to diagnose MRSA infection nor to guide or monitor treatment for MRSA infections.      Studies: Ct Chest W Contrast  05/29/2015  CLINICAL DATA:  Cough, recurrent pneumonia EXAM: CT CHEST WITH CONTRAST TECHNIQUE: Multidetector CT imaging of the chest was performed during intravenous contrast administration. CONTRAST:  79m OMNIPAQUE IOHEXOL 300 MG/ML  SOLN COMPARISON:  Chest x-ray 05/28/2015 and CT scan of the chest 11/16/2014 FINDINGS: Sagittal images of the spine shows osteopenia and mild degenerative change thoracic spine. Central airways are patent. Images of the thoracic inlet are unremarkable. Mild atherosclerotic calcifications of thoracic aorta. A precarinal lymph node measures 7 mm short-axis stable from prior exam not pathologic by size criteria. No hilar adenopathy. Atherosclerotic calcifications of coronary arteries again noted. Atherosclerotic calcifications of abdominal aorta and splenic artery. No adrenal gland mass is noted in visualized upper abdomen. Mild fatty infiltration of the liver. There is small bilateral pleural effusion. Bilateral emphysematous changes again noted. Stable tiny bilateral pulmonary nodules the largest in axial image 16 measures 5 mm in right upper lobe  stable in size in appearance from prior exam. As noted on recent chest x-ray there is bilateral peripheral interstitial thickening in right middle lobe, right lower lobe posterior laterally and left lower lobe anterolaterally please see axial image 47. There are superimposed patchy infiltrate/ alveolar airspace is right middle lobe medially right lower lobe posterior laterally and left lower lobe anterolaterally. Findings consistent with interstitial infiltrate/ multifocal pneumonia. IMPRESSION: 1. Bilateral emphysematous changes again noted. Multifocal infiltrate/pneumonia and peripheral interstitial thickening noted in right middle lobe left lower lobe laterally and right lower lobe posterior laterally. Follow-up to resolution after appropriate treatment is recommended. 2. Stable subcentimeter nodules bilaterally the largest in right upper lobe anteriorly measures 5 mm. 3. No mediastinal or hilar adenopathy. 4. Mild degenerative changes thoracic spine Electronically Signed   By: LLahoma CrockerM.D.   On: 05/29/2015 09:28   Dg Swallowing Func-speech Pathology  05/29/2015  Objective Swallowing Evaluation:   MBS Patient Details Name: RGERRI ACREMRN: [id number] of Birth: 3May 12, 1952Today's Date: 05/29/2015 Time: SLP Start Time (ACUTE ONLY): 1418-SLP Stop Time (ACUTE ONLY): 1438 SLP Time Calculation (min) (ACUTE ONLY): 20 min Past Medical History: Past Medical History Diagnosis Date . History of DVT (deep vein thrombosis)  . Coronary artery disease    multiple stens . COPD (chronic obstructive pulmonary disease) ([place]  . Transient ischemic attack  . Depression  . Hx of radiation therapy 01/16/09 - 03/06/09   base of tongue, right neck node . Peripheral arterial disease ([name])    status post bilateral femoropopliteal bypass graft performed by Dr. TSherren Mochaearly in the past with significant right lower extremity lifestyle limiting claudication . Pneumonia  . Hypercholesteremia  .  Chronic bronchitis ([place])  . Shortness of  breath    "w/exertion and sometimes when laying down" (11/26/2012) . Type II diabetes mellitus ([place])  . History of blood transfusion 1955 . GERD (gastroesophageal reflux disease)  . H/O hiatal hernia  . Stroke [facility]) 2000   left side weakness remains (11/26/2012) . Arthritis    "hands" (11/26/2012) . Cancer of base of tongue ([place]) 11/04/2008   "& lymph nodes @ right neck" (11/26/2012 . Myocardial infarction [facility]) 2005   dr Ellyn Hack  . Fatigue 08/23/2013 . Carotid artery occlusion  Past Surgical History: Past Surgical History Procedure Laterality Date . Tee without cardioversion  07/26/2011   Procedure: TRANSESOPHAGEAL ECHOCARDIOGRAM (TEE);  Surgeon: Pixie Casino, MD;  Location: [facility] ENDOSCOPY;  Service: Cardiovascular;  Laterality: N/A; . Esophagogastroduodenoscopy  12/20/2011   Procedure: ESOPHAGOGASTRODUODENOSCOPY (EGD);  Surgeon: Beryle Beams, MD;  Location: [facility] ENDOSCOPY;  Service: Endoscopy;  Laterality: N/A;  EGD with balloon dilation . Savory dilation  12/20/2011   Procedure: SAVORY DILATION;  Surgeon: Beryle Beams, MD;  Location: [facility] ENDOSCOPY;  Service: Endoscopy;  Laterality: N/A; . Tonsillectomy  1969 . Appendectomy  1962 . Cardiac catheterization  1999; 11/26/2012 . Coronary angioplasty with stent placement  1999--2005   "i think I have 3-5 total" (11/26/2012) . Femoral bypass  1999; 2000   notes 11/26/2012 . Carpal tunnel release Right 1980's? . Vaginal hysterectomy  1988 . Cesarean section  1978 . Tubal ligation  1988 . Cataract extraction w/ intraocular lens implant Right 2011 . Ankle fracture surgery Right 2007   "got a metal plate and 8 screws in it" (11/26/2012) . Bypass graft popliteal to popliteal Right 02/05/2013   Procedure: BYPASS GRAFT ABOVE KNEE POPLITEAL TO BELOW KNEE POPLITEAL WITH SMALL SAPHANOUS VEIN;  Surgeon: Rosetta Posner, MD;  Location: [facility] OR;  Service: Vascular;  Laterality: Right; . Eye surgery   HPI: 64 y.o. female with history of tongue base cancer with radiation s/p 6 yrs, coronary artery  disease status post multiple stents, COPD on chronic home O2, depression, history of CVA with residual left-sided weakness, gastroesophageal reflux disease, type 2 diabetes bolus had recurrent pneumonias approximately 3 over the past year who presents to the ED with a one-week history of fevers with temperatures as high as 101, worsening shortness of breath. Chest x-ray was concerning for interval development of patchy opacities in both lung bases concerning for possible multilobar pneumonia. 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 No Data Recorded Assessment / Plan / Recommendation CHL IP CLINICAL IMPRESSIONS 05/29/2015 Therapy Diagnosis Moderate pharyngeal phase dysphagia;Severe pharyngeal phase dysphagia Clinical Impression Pt exhibited a moderate-severe motor based pharyngeal dysphagia characterized by decreased larygneal elevation and closure, reduced tongue base retraction leading to penetration and aspiration both during and after (from residual). Pt produces a inconsistent reflexive cough that is ineffective in clearing vocal cord penetrates. Chin tuck posture was ineffective to mitigate or prevent laryngeal compromise. Esophagus briefly scan revealing stasis (known dysmotility). Although increasing pharyngeal residue mildly, honey thick appeared to produce a significant decrease in aspiration during or after swallow. Verbal cues for second swallow decreases residue although aspiration risk increases if pt does not perform second swallow. Pt reports "thicker things are harder to go down and I need something thin to wash it down". Recommend the water protocol to allow pt thin water (only) after oral care in beween meals. ST will continue to  follow. If pt reports difficulty with honey thick liquids, may need to consider allowing nectar.     Impact on safety  and function Severe aspiration risk   CHL IP TREATMENT RECOMMENDATION 05/29/2015 Treatment Recommendations Therapy as outlined in treatment plan below   Prognosis 05/29/2015 Prognosis for Safe Diet Advancement Fair Barriers to Reach Goals Severity of deficits Barriers/Prognosis Comment -- CHL IP DIET RECOMMENDATION 05/29/2015 SLP Diet Recommendations Dysphagia 3 (Mech soft) solids;Honey thick liquids Liquid Administration via Cup Medication Administration Crushed with puree Compensations Slow rate;Small sips/bites;Multiple dry swallows after each bite/sip;Follow solids with liquid;Clear throat intermittently Postural Changes Remain semi-upright after after feeds/meals (Comment);Seated upright at 90 degrees   CHL IP OTHER RECOMMENDATIONS 05/29/2015 Recommended Consults -- Oral Care Recommendations Oral care BID Other Recommendations Order thickener from pharmacy   CHL IP FOLLOW UP RECOMMENDATIONS 05/29/2015 Follow up Recommendations (No Data)   CHL IP FREQUENCY AND DURATION 05/29/2015 Speech Therapy Frequency (ACUTE ONLY) min 2x/week Treatment Duration 2 weeks      CHL IP ORAL PHASE 05/29/2015 Oral Phase Impaired Oral - Pudding Teaspoon -- Oral - Pudding Cup -- Oral - Honey Teaspoon -- Oral - Honey Cup -- Oral - Nectar Teaspoon -- Oral - Nectar Cup -- Oral - Nectar Straw -- Oral - Thin Teaspoon -- Oral - Thin Cup -- Oral - Thin Straw -- Oral - Puree -- Oral - Mech Soft -- Oral - Regular Delayed oral transit Oral - Multi-Consistency -- Oral - Pill -- Oral Phase - Comment --  CHL IP PHARYNGEAL PHASE 05/29/2015 Pharyngeal Phase Impaired Pharyngeal- Pudding Teaspoon -- Pharyngeal -- Pharyngeal- Pudding Cup -- Pharyngeal -- Pharyngeal- Honey Teaspoon -- Pharyngeal -- Pharyngeal- Honey Cup Pharyngeal residue - pyriform;Pharyngeal residue - valleculae;Reduced tongue base retraction;Reduced laryngeal elevation;Reduced airway/laryngeal closure Pharyngeal -- Pharyngeal- Nectar Teaspoon -- Pharyngeal -- Pharyngeal- Nectar Cup  Penetration/Apiration after swallow;Reduced airway/laryngeal closure;Reduced laryngeal elevation;Pharyngeal residue - pyriform;Pharyngeal residue - valleculae;Reduced tongue base retraction Pharyngeal Material enters airway, passes BELOW cords without attempt by patient to eject out (silent aspiration);Material enters airway, passes BELOW cords and not ejected out despite cough attempt by patient Pharyngeal- Nectar Straw -- Pharyngeal -- Pharyngeal- Thin Teaspoon -- Pharyngeal -- Pharyngeal- Thin Cup Penetration/Aspiration during swallow;Reduced airway/laryngeal closure;Pharyngeal residue - valleculae;Pharyngeal residue - pyriform;Reduced laryngeal elevation;Reduced tongue base retraction;Penetration/Apiration after swallow Pharyngeal Material enters airway, passes BELOW cords without attempt by patient to eject out (silent aspiration) Pharyngeal- Thin Straw -- Pharyngeal -- Pharyngeal- Puree NT Pharyngeal -- Pharyngeal- Mechanical Soft -- Pharyngeal -- Pharyngeal- Regular Pharyngeal residue - valleculae;Reduced tongue base retraction Pharyngeal -- Pharyngeal- Multi-consistency -- Pharyngeal -- Pharyngeal- Pill -- Pharyngeal -- Pharyngeal Comment --  CHL IP CERVICAL ESOPHAGEAL PHASE 05/29/2015 Cervical Esophageal Phase WFL Pudding Teaspoon -- Pudding Cup -- Honey Teaspoon -- Honey Cup -- Nectar Teaspoon -- Nectar Cup -- Nectar Straw -- Thin Teaspoon -- Thin Cup -- Thin Straw -- Puree -- Mechanical Soft -- Regular -- Multi-consistency -- Pill -- Cervical Esophageal Comment -- [place] Siren 05/29/2015, 3:36 PM Orbie Pyo Litaker M.Ed CCC-SLP Pager [phone]               Scheduled Meds:  Scheduled Meds: . arformoterol  15 mcg Nebulization BID  . aspirin EC  81 mg Oral Daily  . budesonide (PULMICORT) nebulizer solution  0.5 mg Nebulization BID  . buPROPion  150 mg Oral Daily  . cefTRIAXone (ROCEPHIN)  IV  2 g Intravenous Q24H  . clopidogrel  75 mg Oral Daily  . enoxaparin (LOVENOX) injection  40  mg  Subcutaneous Q24H  . feeding supplement (ENSURE ENLIVE)  237 mL Oral BID BM  . guaiFENesin  1,200 mg Oral BID  . insulin aspart  0-20 Units Subcutaneous TID WC  . insulin aspart  0-5 Units Subcutaneous QHS  . insulin aspart  3 Units Subcutaneous TID WC  . insulin glargine  20 Units Subcutaneous BID  . ipratropium-albuterol  3 mL Nebulization Q6H  . loratadine  10 mg Oral Daily  . methylPREDNISolone (SOLU-MEDROL) injection  60 mg Intravenous Q6H  . omega-3 acid ethyl esters  1 g Oral BID  . pantoprazole  40 mg Oral QAC breakfast  . polyethylene glycol  17 g Oral Daily  . rosuvastatin  20 mg Oral QHS   Continuous Infusions:   Time spent on care of this patient: 39 min   Hamler, MD 05/30/2015, 12:12 PM  LOS: 2 days   [facility] Office  [phone] Pager - Text Page per [email or web address] If 7PM-7AM, please contact night-coverage [email or web address]

## 2015-05-31 DIAGNOSIS — J690 Pneumonitis due to inhalation of food and vomit: Secondary | ICD-10-CM

## 2015-05-31 DIAGNOSIS — Z794 Long term (current) use of insulin: Secondary | ICD-10-CM

## 2015-05-31 DIAGNOSIS — E1100 Type 2 diabetes mellitus with hyperosmolarity without nonketotic hyperglycemic-hyperosmolar coma (NKHHC): Secondary | ICD-10-CM

## 2015-05-31 LAB — GLUCOSE, CAPILLARY
GLUCOSE-CAPILLARY: 260 mg/dL — AB (ref 65–99)
Glucose-Capillary: 48 mg/dL — ABNORMAL LOW (ref 65–99)
Glucose-Capillary: 95 mg/dL (ref 65–99)
Glucose-Capillary: 159 mg/dL — ABNORMAL HIGH (ref 65–99)
Glucose-Capillary: 106 mg/dL — ABNORMAL HIGH (ref 65–99)

## 2015-05-31 MED ORDER — INSULIN GLARGINE 100 UNIT/ML ~~LOC~~ SOLN
14.0000 [IU] | Freq: Two times a day (BID) | SUBCUTANEOUS | Status: DC
  Administered 2015-05-31: 14 [IU] via SUBCUTANEOUS
  Filled 2015-05-31 (×3): qty 0.14

## 2015-05-31 NOTE — Progress Notes (Signed)
   Name: Brenda [name] MRN: [id number] DOB: June 30, 1950    ADMISSION DATE:  05/28/2015 CONSULTATION DATE:  05/29/15  REFERRING MD :  Dr. Grandville Silos   CHIEF COMPLAINT:  Recurrent PNA   BRIEF PATIENT DESCRIPTION: 64 y/o F, smoker, with severe COPD and recurrent pneumonia.    SIGNIFICANT EVENTS  12/25  Admit with concern for recurrent PNA  STUDIES:  12/26  CT Chest >> bilateral emphysematous changes, multifocal infiltrate & peripheral interstitial thickening in RML, LLL laterally and RLL posterior laterally, stable subcentimeter nodules bilaterally in RUL, largest measures 48m, no mediastinal or hilar adenopathy, mild degenerative changes of the spine.  12/26 SLP eval> aspirating  SUBJECTIVE: Up in chair today, feeling a bit better  VITAL SIGNS: Temp:  [97.8 F (36.6 C)-97.9 F (36.6 C)] 97.9 F (36.6 C) (12/28 1323) Pulse Rate:  [78-82] 82 (12/28 1323) Resp:  [18] 18 (12/28 1323) BP: (136-142)/(62-64) 140/64 mmHg (12/28 1323) SpO2:  [95 %-100 %] 98 % (12/28 1323)  PHYSICAL EXAMINATION: General:  Chronically ill appearing, no acute distress,  Neuro:  A&Ox4, MAEW HEENT:  MMM, OP clear, neck supple Cardiovascular:  RRR, no mgr  Lungs:  Normal effort, wheezing bilaterally, rhonchi noted but improved Abdomen: BS+, soft, non-tender, non-distended Musculoskeletal:  No acute deformities Skin:  Warm/dry, no rashes or lesions, no edema    Recent Labs Lab 05/28/15 1156 05/29/15 0602 05/30/15 0818  NA 131* 135 133*  K 4.5 4.5 4.7  CL 93* 102 98*  CO2 '30 23 28  '$ BUN '18 14 13  '$ CREATININE 1.05* 0.65 0.68  GLUCOSE 296* 252* 313*    Recent Labs Lab 05/28/15 1156 05/29/15 0602  HGB 13.8 11.9*  HCT 41.6 37.6  WBC 20.2* 7.8  PLT 274 218   No results found.   CULTURES:  Flu Panel 12/25 >> neg U. Strep 12/25 >> positive  U. [place] 12/25 >>    ASSESSMENT / PLAN:   Acute on Chronic Hypoxic Respiratory Failure - baseline 3-4L dependent Recurrent Multilobar PNA  (streptococcal) - due to aspiration  Tobacco Abuse  > ongoing Dysphagia with recurrent aspiration pneumonia Esophageal dysmotility  Plan: I think she should walk tomorrow, maybe home by Friday Quit smoking Continue diet per SLP: Dysphagia 3, Honey thick, meds with puree Needs to follow up with Dr. HBenson Norwaywith GI after discharge for dysmotility > I asked her to call today Titrate O2 to O2 saturation > 90%    Acute COPD Exacerbation   Plan: Pulmonary hygiene Continue guaifenesin Instructed on flutter use today Prednisone taper as ordered Pulmicort / Brovana Q12  Duonebs Q6 + PRN  Continue claritin     BRoselie Awkward MD LTowerPCCM Pager: [phone]: [phone] 3pm or if no response, call [phone]

## 2015-05-31 NOTE — Care Management Important Message (Signed)
Important Message  Patient Details  Name: Brenda [name] MRN: [id number] Date of Birth: [birth year]   Medicare Important Message Given:  Yes    Carles Collet, RN 05/31/2015, 3:37 [place] Message  Patient Details  Name: Brenda [name] MRN: [id number] Date of Birth: [birth year]   Medicare Important Message Given:  Yes    Carles Collet, RN 05/31/2015, 3:37 PM

## 2015-05-31 NOTE — Progress Notes (Signed)
Occupational Therapy Treatment Patient Details Name: Brenda [name] MRN: [id number] DOB: [birth year] Today's Date: 05/31/2015    History of present illness Pt admitted with acute on chronic respiratory failure.  PMH: recurrent PNA, COPD with home 02 (3L), CAD with stents, CVA, DM, smoker.   OT comments  Patient progressing towards OT goals, continue plan of care for now. D/C recommendation changed to SNF at this time. Pt with decreased activity tolerance/endurance, de-sating on 3L/min supplemental 02, and with buckling of knees during OT session. Pt ambulated to sink for grooming tasks and sats decreased to 82%, requiring seated rest break and pursed lip breathing before sat increased to 92%. Sats increased with these energy conservation techniques within ~3 minutes.    Follow Up Recommendations  SNF;Supervision/Assistance - 24 hour    Equipment Recommendations  Other (comment) (TBD)    Recommendations for Other Services  None at this time  Precautions / Restrictions Precautions Precautions: Fall Precaution Comments: watch sats Restrictions Weight Bearing Restrictions: No    Mobility Bed Mobility Overal bed mobility: Needs Assistance Bed Mobility: Supine to Sit Sit to supine: Supervision   General bed mobility comments: HOB up, supervision for safety   Transfers Overall transfer level: Needs assistance Equipment used: Rolling walker (2 wheeled) Transfers: Sit to/from Stand Sit to Stand: Min guard;Min assist General transfer comment: min guard/min assist due to buckling of knees upon initial sit to stand    Balance Overall balance assessment: Needs assistance Sitting-balance support: No upper extremity supported;Feet supported Sitting balance-Leahy Scale: Good     Standing balance support: Bilateral upper extremity supported;During functional activity Standing balance-Leahy Scale: Fair   ADL Overall ADL's : Needs assistance/impaired Eating/Feeding:  Independent;Sitting   Grooming: Wash/dry hands;Standing;Oral care;Supervision/safety;Min guard Grooming Details (indicate cue type and reason): provided min guard secondary to patient with buckling of knees during initial stand  Upper Body Bathing: Set up;Sitting   Lower Body Bathing: Min guard;Sit to/from stand Lower Body Bathing Details (indicate cue type and reason): buckling of knees upon intial stand Upper Body Dressing : Set up;Sitting   Lower Body Dressing: Min guard;Sit to/from stand Lower Body Dressing Details (indicate cue type and reason): buckling of knees  Toilet Transfer: Min guard;BSC;Ambulation   Toileting- Clothing Manipulation and Hygiene: Min guard;Sit to/from stand         General ADL Comments: Pt with coughing episodes throughout session, productive. Pt stood from EOB and had buckling of knees, therapist provided min guard assist throughout rest of session due to this. Pt reports her knees have done this before causing a fall.      Cognition   Behavior During Therapy: Flat affect Overall Cognitive Status: Within Functional Limits for tasks assessed                 Pertinent Vitals/ Pain       Pain Assessment: No/denies pain   Frequency Min 2X/week     Progress Toward Goals  OT Goals(current goals can now befound in the care plan section)  Progress towards OT goals: Progressing toward goals  Acute Rehab OT Goals Patient Stated Goal: go to rehab  Plan Discharge plan needs to be updated    End of Session Equipment Utilized During Treatment: Oxygen (3L)   Activity Tolerance Patient tolerated treatment well   Patient Left in chair;with call bell/phone within reach;with chair alarm set    Time: 8469-6295 OT Time Calculation (min): 20 min  Charges: OT General Charges $OT Visit: 1 Procedure OT Treatments $Self Care/Home Management :  8-22 mins  Chrys Racer , MS, OTR/L, [place] Pager: [phone]  05/31/2015, 10:57 AM

## 2015-05-31 NOTE — Progress Notes (Signed)
Inpatient Diabetes Program Recommendations  AACE/ADA: New Consensus Statement on Inpatient Glycemic Control (2015)  Target Ranges:  Prepandial:   less than 140 mg/dL      Peak postprandial:   less than 180 mg/dL (1-2 hours)      Critically ill patients:  140 - 180 mg/dL   Review of Glycemic Control Results for Brenda [name], Brenda [name] (MRN [id number]) as of 05/31/2015 13:38  Ref. Range 05/31/2015 07:53  Glucose-Capillary Latest Ref Range: 65-99 mg/dL 48 (L)   Inpatient Diabetes Program Recommendations:  Insulin - Basal: reduce Lantus to 15 units BID Correction (SSI): decrease Novolog to moderate scale with steroid reduction Thank you  Raoul Pitch BSN, RN,CDE Inpatient Diabetes Coordinator [phone] (team pager)

## 2015-05-31 NOTE — Progress Notes (Signed)
[facility] Progress Note   Brenda [name]  MOQ:[id number]  DOB: 09/28/1950  DOA: 05/28/2015 PCP: [name], MD  Brief narrative: Brenda [name] is a 64 y.o. 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    Subjective: Feel cough and shortness of breath are slightly better.   Assessment/Plan: Principal Problem:   Acute on chronic respiratory failure with hypoxia- bilateral pneumonia- COPD exacerbation - MBS shows high aspiration risk and honey hick liquids recommended- this is likely the cause of recurrent pneumonias but she is also strep pneumo antigen postive- cont Rocephin per pulmonary -cont Nebs and solumedrol - influenza negative- stopped tamiflu   Active Problems:   DM (diabetes mellitus), type 2, uncontrolled - hypoglycemic today - decrease Lantus - cont mealtime insulin    History of CVA (cerebrovascular accident) and CAD s/p stents -cont ASA, Plavix and statin  Hyponatremia - improved  AKI - improved   Code Status:     Code Status Orders        Start     Ordered   05/28/15 1529  Full code   Continuous     05/28/15 1529     Family Communication: husband Disposition Plan:   DVT prophylaxis: Lovenox Consultants: PCCM Procedures: Antibiotics: Anti-infectives    Start     Dose/Rate Route Frequency Ordered Stop   05/29/15 1300  cefTRIAXone (ROCEPHIN) 2 g in dextrose 5 % 50 mL IVPB     2 g 100 mL/hr over 30 Minutes Intravenous Every 24 hours 05/29/15 1138     05/29/15 1230  cefTRIAXone (ROCEPHIN) 1 g in dextrose 5 % 50 mL IVPB  Status:  Discontinued     1 g 100 mL/hr over 30 Minutes Intravenous Every 24 hours 05/28/15 1237 05/28/15 1529   05/29/15 1230  azithromycin (ZITHROMAX) 500 mg in dextrose 5 % 250 mL IVPB  Status:  Discontinued     500 mg 250 mL/hr over 60 Minutes Intravenous Every 24 hours  05/28/15 1237 05/29/15 1138   05/29/15 0430  vancomycin (VANCOCIN) 500 mg in sodium chloride 0.9 % 100 mL IVPB  Status:  Discontinued     500 mg 100 mL/hr over 60 Minutes Intravenous Every 12 hours 05/28/15 1536 05/29/15 1138   05/28/15 2200  oseltamivir (TAMIFLU) capsule 75 mg  Status:  Discontinued     75 mg Oral 2 times daily 05/28/15 1529 05/29/15 1652   05/28/15 1630  vancomycin (VANCOCIN) IVPB 1000 mg/200 mL premix     1,000 mg 200 mL/hr over 60 Minutes Intravenous  Once 05/28/15 1536 05/28/15 1835   05/28/15 1630  ceFEPIme (MAXIPIME) 1 g in dextrose 5 % 50 mL IVPB  Status:  Discontinued     1 g 100 mL/hr over 30 Minutes Intravenous Every 24 hours 05/28/15 1536 05/29/15 1138   05/28/15 1530  azithromycin (ZITHROMAX) 500 mg in dextrose 5 % 250 mL IVPB  Status:  Discontinued     500 mg 250 mL/hr over 60 Minutes Intravenous Every 24 hours 05/28/15 1529 05/28/15 1538   05/28/15 1230  cefTRIAXone (ROCEPHIN) 1 g in dextrose 5 % 50 mL IVPB     1 g 100 mL/hr over 30 Minutes Intravenous  Once 05/28/15 1229 05/28/15 1320   05/28/15 1230  azithromycin (ZITHROMAX) 500 mg in dextrose 5 % 250 mL IVPB  500 mg 250 mL/hr over 60 Minutes Intravenous  Once 05/28/15 1229 05/28/15 1412      Objective: Filed Weights   05/28/15 1138  Weight: 58.06 kg (128 lb)    Intake/Output Summary (Last 24 hours) at 05/31/15 1659 Last data filed at 05/31/15 1501  Gross per 24 hour  Intake    360 ml  Output   2300 ml  Net  -1940 ml     Vitals Filed Vitals:   05/31/15 0551 05/31/15 0700 05/31/15 1300 05/31/15 1323  BP: 142/62   140/64  Pulse: 78   82  Temp: 97.8 F (36.6 C)   97.9 F (36.6 C)  TempSrc: Oral   Oral  Resp: 18   18  Height:      Weight:      SpO2: 100% 99% 99% 98%    Exam:  General:  Pt is alert, not in acute distress  HEENT: No icterus, No thrush, oral mucosa moist  Cardiovascular: regular rate and rhythm, S1/S2 No murmur  Respiratory: b/l rhonchi  Abdomen: Soft,  +Bowel sounds, non tender, non distended, no guarding  MSK: No LE edema, cyanosis or clubbing  Data Reviewed: Basic Metabolic Panel:  Recent Labs Lab 05/28/15 1156 05/28/15 1710 05/29/15 0602 05/30/15 0818  NA 131*  --  135 133*  K 4.5  --  4.5 4.7  CL 93*  --  102 98*  CO2 30  --  23 28  GLUCOSE 296*  --  252* 313*  BUN 18  --  14 13  CREATININE 1.05*  --  0.65 0.68  CALCIUM 8.7*  --  8.2* 8.7*  MG  --  1.7  --   --    Liver Function Tests:  Recent Labs Lab 05/28/15 1156 05/29/15 0602  AST 16 31  ALT 14 22  ALKPHOS 187* 186*  BILITOT 1.2 0.9  PROT 5.7* 5.1*  ALBUMIN 2.3* 2.1*   No results for input(s): LIPASE, AMYLASE in the last 168 hours. No results for input(s): AMMONIA in the last 168 hours. CBC:  Recent Labs Lab 05/28/15 1156 05/29/15 0602  WBC 20.2* 7.8  NEUTROABS 19.2* 7.5  HGB 13.8 11.9*  HCT 41.6 37.6  MCV 91.8 93.1  PLT 274 218   Cardiac Enzymes: No results for input(s): CKTOTAL, CKMB, CKMBINDEX, TROPONINI in the last 168 hours. BNP (last 3 results) No results for input(s): BNP in the last 8760 hours.  ProBNP (last 3 results) No results for input(s): PROBNP in the last 8760 hours.  CBG:  Recent Labs Lab 05/30/15 1722 05/30/15 2205 05/31/15 0753 05/31/15 0836 05/31/15 1238  GLUCAP 165* 113* 48* 95 260*    Recent Results (from the past 240 hour(s))  Blood Culture (routine x 2)     Status: None (Preliminary result)   Collection Time: 05/28/15 12:02 PM  Result Value Ref Range Status   Specimen Description BLOOD LEFT ANTECUBITAL  Final   Special Requests BOTTLES DRAWN AEROBIC AND ANAEROBIC 5MLS  Final   Culture NO GROWTH 3 DAYS  Final   Report Status PENDING  Incomplete  Blood Culture (routine x 2)     Status: None (Preliminary result)   Collection Time: 05/28/15 12:50 PM  Result Value Ref Range Status   Specimen Description BLOOD RIGHT ANTECUBITAL  Final   Special Requests BOTTLES DRAWN AEROBIC AND ANAEROBIC 10MLS  Final    Culture NO GROWTH 3 DAYS  Final   Report Status PENDING  Incomplete  Urine culture  Status: None   Collection Time: 05/28/15  3:29 PM  Result Value Ref Range Status   Specimen Description URINE, RANDOM  Final   Special Requests ADDED 2222  Final   Culture NO GROWTH 1 DAY  Final   Report Status 05/29/2015 FINAL  Final  MRSA PCR Screening     Status: None   Collection Time: 05/28/15  6:05 PM  Result Value Ref Range Status   MRSA by PCR NEGATIVE NEGATIVE Final    Comment:        The GeneXpert MRSA Assay (FDA approved for NASAL specimens only), is one component of a comprehensive MRSA colonization surveillance program. It is not intended to diagnose MRSA infection nor to guide or monitor treatment for MRSA infections.      Studies: No results found.  Scheduled Meds:  Scheduled Meds: . arformoterol  15 mcg Nebulization BID  . aspirin EC  81 mg Oral Daily  . budesonide (PULMICORT) nebulizer solution  0.5 mg Nebulization BID  . buPROPion  150 mg Oral Daily  . cefTRIAXone (ROCEPHIN)  IV  2 g Intravenous Q24H  . clopidogrel  75 mg Oral Daily  . enoxaparin (LOVENOX) injection  40 mg Subcutaneous Q24H  . feeding supplement (ENSURE ENLIVE)  237 mL Oral BID BM  . guaiFENesin  1,200 mg Oral BID  . insulin aspart  0-20 Units Subcutaneous TID WC  . insulin aspart  0-5 Units Subcutaneous QHS  . insulin aspart  3 Units Subcutaneous TID WC  . insulin glargine  20 Units Subcutaneous BID  . ipratropium-albuterol  3 mL Nebulization Q6H  . loratadine  10 mg Oral Daily  . omega-3 acid ethyl esters  1 g Oral BID  . pantoprazole  40 mg Oral QAC breakfast  . polyethylene glycol  17 g Oral Daily  . predniSONE  40 mg Oral Q breakfast  . rosuvastatin  20 mg Oral QHS   Continuous Infusions:   Time spent on care of this patient: 35 min   [place], MD 05/31/2015, 4:59 PM  LOS: 3 days   [facility] Office  [phone] Pager - Text Page per [email or web address] If 7PM-7AM,  please contact night-coverage [email or web address]

## 2015-05-31 NOTE — Progress Notes (Signed)
Speech Language Pathology Treatment: Dysphagia  Patient Details Name: WAYLON KOFFLER MRN: [id number] DOB: [birth year] Today's Date: 05/31/2015 Time: 0910-0930 SLP Time Calculation (min) (ACUTE ONLY): 20 min  Assessment / Plan / Recommendation Clinical Impression  Dysphagia treatment during breakfast. Pt coughing prior to initiating po's while repositioning for meal. Pt is on the water protocol; allowed thin water (only) between meals after oral care. Pt is insistent on consuming sips thin water during meals after education re: aspiration risk of oral or pharyngeal residue if consumed during meals. Encouraged/reminded thin water not needed to initiate second swallow to clear residue during yesterday's MBS. Pt cues to use honey orange juice. She required moderate and intermittent reminders to alternate liquids every couple of bites. Mild-moderate coughing throughout meal (reflexive) with suspected intermittent penetration/aspiration. Fatigue and increased dyspnea observed. Aspiration risk is significant. This SLP is sympathetic re: her significant xerostomia following radiation, pharyngeal residue and desire for thin liquids during meals, however this increases aspiration risk. SLP provided education for thickener with verbal and written instruction. Thick liquids to continue at SNF, however question compliance when discharged home.    HPI HPI: 64 y.o. 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 Chest x-ray was concerning for interval development of patchy opacities in both lung bases concerning for possible  multilobar pneumonia. 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      SLP Plan  Continue with current plan of care     Recommendations  Diet recommendations: Dysphagia 3 (mechanical soft);Honey-thick liquid (thin water in between meals) Liquids provided via: Cup;No straw Medication Administration: Whole meds with puree Supervision: Patient able to self feed;Intermittent supervision to cue for compensatory strategies Compensations: Slow rate;Small sips/bites;Multiple dry swallows after each bite/sip;Follow solids with liquid;Clear throat intermittently Postural Changes and/or Swallow Maneuvers: Seated upright 90 degrees;Upright 30-60 min after meal              Oral Care Recommendations: Oral care BID Follow up Recommendations:  (TBD) Plan: Continue with current plan of care   [place] Siren 05/31/2015, 10:44 AM  Orbie Pyo Colvin Caroli.Ed [employer] [phone]

## 2015-06-01 DIAGNOSIS — Z515 Encounter for palliative care: Secondary | ICD-10-CM

## 2015-06-01 DIAGNOSIS — E162 Hypoglycemia, unspecified: Secondary | ICD-10-CM

## 2015-06-01 LAB — GLUCOSE, CAPILLARY
GLUCOSE-CAPILLARY: 57 mg/dL — AB (ref 65–99)
GLUCOSE-CAPILLARY: 255 mg/dL — AB (ref 65–99)
Glucose-Capillary: 94 mg/dL (ref 65–99)
Glucose-Capillary: 180 mg/dL — ABNORMAL HIGH (ref 65–99)
Glucose-Capillary: 221 mg/dL — ABNORMAL HIGH (ref 65–99)

## 2015-06-01 LAB — PROCALCITONIN: PROCALCITONIN: 0.2 ng/mL

## 2015-06-01 MED ORDER — BIOTENE DRY MOUTH MT LIQD
15.0000 mL | Freq: Four times a day (QID) | OROMUCOSAL | Status: DC
  Administered 2015-06-01 – 2015-06-02 (×2): 15 mL via OROMUCOSAL

## 2015-06-01 MED ORDER — FLUTICASONE PROPIONATE 50 MCG/ACT NA SUSP
2.0000 | Freq: Every day | NASAL | Status: DC
  Administered 2015-06-01 – 2015-06-02 (×2): 2 via NASAL
  Filled 2015-06-01: qty 16

## 2015-06-01 MED ORDER — DEXTROSE 50 % IV SOLN
INTRAVENOUS | Status: AC
  Administered 2015-06-01: 50 mL
  Filled 2015-06-01: qty 50

## 2015-06-01 NOTE — Progress Notes (Signed)
[facility] Progress Note   Brenda [name]  DZH:[id number]  DOB: 1950-10-12  DOA: 05/28/2015 PCP: [name], MD  Brief narrative: Brenda [name] is a 63 y.o. 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    Subjective: Continues to have steady improvement in breathing and cough but is not liking the thick liquid diet.   Assessment/Plan: Principal Problem:   Acute on chronic respiratory failure with hypoxia- bilateral pneumonia- COPD exacerbation- sepsis - MBS shows high aspiration risk and honey hick liquids recommended- this is likely the cause of recurrent pneumonias but she is also strep pneumo antigen postive- cont Rocephin per pulmonary -cont Nebs and solumedrol - influenza negative- stopped tamiflu   Active Problems:   DM (diabetes mellitus), type 2, uncontrolled - hypoglycemic again today despite a decrease in Lantus -will d/c it completely -  cont mealtime insulin    History of CVA (cerebrovascular accident) and CAD s/p stents -cont ASA, Plavix and statin  Hyponatremia - improved  AKI - improved   Code Status:     Code Status Orders        Start     Ordered   05/28/15 1529  Full code   Continuous     05/28/15 1529     Family Communication: husband Disposition Plan:   DVT prophylaxis: Lovenox Consultants: PCCM Procedures: Antibiotics: Anti-infectives    Start     Dose/Rate Route Frequency Ordered Stop   05/29/15 1300  cefTRIAXone (ROCEPHIN) 2 g in dextrose 5 % 50 mL IVPB     2 g 100 mL/hr over 30 Minutes Intravenous Every 24 hours 05/29/15 1138     05/29/15 1230  cefTRIAXone (ROCEPHIN) 1 g in dextrose 5 % 50 mL IVPB  Status:  Discontinued     1 g 100 mL/hr over 30 Minutes Intravenous Every 24 hours 05/28/15 1237 05/28/15 1529   05/29/15 1230  azithromycin (ZITHROMAX) 500 mg in dextrose 5 % 250 mL  IVPB  Status:  Discontinued     500 mg 250 mL/hr over 60 Minutes Intravenous Every 24 hours 05/28/15 1237 05/29/15 1138   05/29/15 0430  vancomycin (VANCOCIN) 500 mg in sodium chloride 0.9 % 100 mL IVPB  Status:  Discontinued     500 mg 100 mL/hr over 60 Minutes Intravenous Every 12 hours 05/28/15 1536 05/29/15 1138   05/28/15 2200  oseltamivir (TAMIFLU) capsule 75 mg  Status:  Discontinued     75 mg Oral 2 times daily 05/28/15 1529 05/29/15 1652   05/28/15 1630  vancomycin (VANCOCIN) IVPB 1000 mg/200 mL premix     1,000 mg 200 mL/hr over 60 Minutes Intravenous  Once 05/28/15 1536 05/28/15 1835   05/28/15 1630  ceFEPIme (MAXIPIME) 1 g in dextrose 5 % 50 mL IVPB  Status:  Discontinued     1 g 100 mL/hr over 30 Minutes Intravenous Every 24 hours 05/28/15 1536 05/29/15 1138   05/28/15 1530  azithromycin (ZITHROMAX) 500 mg in dextrose 5 % 250 mL IVPB  Status:  Discontinued     500 mg 250 mL/hr over 60 Minutes Intravenous Every 24 hours 05/28/15 1529 05/28/15 1538   05/28/15 1230  cefTRIAXone (ROCEPHIN) 1 g in dextrose 5 % 50 mL IVPB     1 g 100 mL/hr over 30 Minutes Intravenous  Once 05/28/15 1229 05/28/15 1320   05/28/15  1230  azithromycin (ZITHROMAX) 500 mg in dextrose 5 % 250 mL IVPB     500 mg 250 mL/hr over 60 Minutes Intravenous  Once 05/28/15 1229 05/28/15 1412      Objective: Filed Weights   05/28/15 1138  Weight: 58.06 kg (128 lb)    Intake/Output Summary (Last 24 hours) at 06/01/15 1525 Last data filed at 06/01/15 1446  Gross per 24 hour  Intake    732 ml  Output   1750 ml  Net  -1018 ml     Vitals Filed Vitals:   06/01/15 0535 06/01/15 0824 06/01/15 1409 06/01/15 1427  BP: 147/71  133/69   Pulse: 69  80   Temp: 97.5 F (36.4 C)  98.5 F (36.9 C)   TempSrc: Oral  Oral   Resp: 20  20   Height:      Weight:      SpO2: 96% 98% 98% 97%    Exam:  General:  Pt is alert, not in acute distress  HEENT: No icterus, No thrush, oral mucosa moist  Cardiovascular:  regular rate and rhythm, S1/S2 No murmur  Respiratory: b/l rhonchi- no wheeze  Abdomen: Soft, +Bowel sounds, non tender, non distended, no guarding  MSK: No LE edema, cyanosis or clubbing  Data Reviewed: Basic Metabolic Panel:  Recent Labs Lab 05/28/15 1156 05/28/15 1710 05/29/15 0602 05/30/15 0818  NA 131*  --  135 133*  K 4.5  --  4.5 4.7  CL 93*  --  102 98*  CO2 30  --  23 28  GLUCOSE 296*  --  252* 313*  BUN 18  --  14 13  CREATININE 1.05*  --  0.65 0.68  CALCIUM 8.7*  --  8.2* 8.7*  MG  --  1.7  --   --    Liver Function Tests:  Recent Labs Lab 05/28/15 1156 05/29/15 0602  AST 16 31  ALT 14 22  ALKPHOS 187* 186*  BILITOT 1.2 0.9  PROT 5.7* 5.1*  ALBUMIN 2.3* 2.1*   No results for input(s): LIPASE, AMYLASE in the last 168 hours. No results for input(s): AMMONIA in the last 168 hours. CBC:  Recent Labs Lab 05/28/15 1156 05/29/15 0602  WBC 20.2* 7.8  NEUTROABS 19.2* 7.5  HGB 13.8 11.9*  HCT 41.6 37.6  MCV 91.8 93.1  PLT 274 218   Cardiac Enzymes: No results for input(s): CKTOTAL, CKMB, CKMBINDEX, TROPONINI in the last 168 hours. BNP (last 3 results) No results for input(s): BNP in the last 8760 hours.  ProBNP (last 3 results) No results for input(s): PROBNP in the last 8760 hours.  CBG:  Recent Labs Lab 05/31/15 1743 05/31/15 2215 06/01/15 0749 06/01/15 0853 06/01/15 1159  GLUCAP 159* 106* 57* 94 180*    Recent Results (from the past 240 hour(s))  Blood Culture (routine x 2)     Status: None (Preliminary result)   Collection Time: 05/28/15 12:02 PM  Result Value Ref Range Status   Specimen Description BLOOD LEFT ANTECUBITAL  Final   Special Requests BOTTLES DRAWN AEROBIC AND ANAEROBIC 5MLS  Final   Culture NO GROWTH 4 DAYS  Final   Report Status PENDING  Incomplete  Blood Culture (routine x 2)     Status: None (Preliminary result)   Collection Time: 05/28/15 12:50 PM  Result Value Ref Range Status   Specimen Description BLOOD  RIGHT ANTECUBITAL  Final   Special Requests BOTTLES DRAWN AEROBIC AND ANAEROBIC 10MLS  Final   Culture  NO GROWTH 4 DAYS  Final   Report Status PENDING  Incomplete  Urine culture     Status: None   Collection Time: 05/28/15  3:29 PM  Result Value Ref Range Status   Specimen Description URINE, RANDOM  Final   Special Requests ADDED 2222  Final   Culture NO GROWTH 1 DAY  Final   Report Status 05/29/2015 FINAL  Final  MRSA PCR Screening     Status: None   Collection Time: 05/28/15  6:05 PM  Result Value Ref Range Status   MRSA by PCR NEGATIVE NEGATIVE Final    Comment:        The GeneXpert MRSA Assay (FDA approved for NASAL specimens only), is one component of a comprehensive MRSA colonization surveillance program. It is not intended to diagnose MRSA infection nor to guide or monitor treatment for MRSA infections.      Studies: No results found.  Scheduled Meds:  Scheduled Meds: . arformoterol  15 mcg Nebulization BID  . aspirin EC  81 mg Oral Daily  . budesonide (PULMICORT) nebulizer solution  0.5 mg Nebulization BID  . buPROPion  150 mg Oral Daily  . cefTRIAXone (ROCEPHIN)  IV  2 g Intravenous Q24H  . clopidogrel  75 mg Oral Daily  . enoxaparin (LOVENOX) injection  40 mg Subcutaneous Q24H  . feeding supplement (ENSURE ENLIVE)  237 mL Oral BID BM  . guaiFENesin  1,200 mg Oral BID  . insulin aspart  0-20 Units Subcutaneous TID WC  . insulin aspart  0-5 Units Subcutaneous QHS  . insulin aspart  3 Units Subcutaneous TID WC  . ipratropium-albuterol  3 mL Nebulization Q6H  . loratadine  10 mg Oral Daily  . omega-3 acid ethyl esters  1 g Oral BID  . pantoprazole  40 mg Oral QAC breakfast  . polyethylene glycol  17 g Oral Daily  . predniSONE  40 mg Oral Q breakfast  . rosuvastatin  20 mg Oral QHS   Continuous Infusions:   Time spent on care of this patient: 35 min   [place], MD 06/01/2015, 3:25 PM  LOS: 4 days   [facility] Office  [phone] Pager  - Text Page per [email or web address] If 7PM-7AM, please contact night-coverage [email or web address]

## 2015-06-01 NOTE — Clinical Documentation Improvement (Signed)
Internal Medicine  Please clarify if Sepsis ruled in (Code Sepsis called in ED) or after further study Sepsis ruled out. Document findings in next progress note; NOT in BPA drop down box. Thanks!   Other  Clinically Undetermined  Supporting Information:  WBC was 20 on admission with lactate of 0.95  Hypotensive: 92/40 with Map of 53, 100/48 Map of 60, 95/52 Map of 63  HR > 90, RR ranging from 11 to 22  Source of infection: recurrent strep Pneumonia, possible aspiration Pneumonia  Organ Failure  IV Rocephin, Zithromax, Vancomycin ordered  Please exercise your independent, professional judgment when responding. A specific answer is not anticipated or expected.  Thank You, Zoila Shutter  RN, BSN, [place] [phone]; Cell: [phone]

## 2015-06-01 NOTE — Progress Notes (Signed)
06/01/15 Patient is non compliant with honey liquids, refuses to use thickener with her juices and fluids.

## 2015-06-01 NOTE — Consult Note (Signed)
Consultation Note Date: 06/01/2015   Patient Name: Brenda [name]  DOB: 01/04/1951  MRN: [id number]  Age / Sex: 64 y.o., female  PCP: Farris Has, MD Referring Physician: Calvert Cantor, MD  Reason for Consultation: Establishing goals of care and Non pain symptom management  Clinical Assessment/Narrative: 64 yo woman with advanced COPD, home O2 dependent, continued tobacco abuse/smoking and recurrent PNA related to chronic dysphagia/aspiration. She has a history of oral-pharyngeal cancer and underwent radiation and chemotherapy deemed cured and surveillence was stopped in 2015. She is followed closely for her COPD by Dr. Kendrick Fries in the outpatient setting. P consulted by EDP at time of admission due to 2nd hospitalization in 6 months and advanced COPD.  I met Brenda [name] today and introduced the concept of palliative care and provided education on the importance of Advance care planning. She relies heavily on her oldest daughter who "is a medical person", but it is clear to me that she has not really given a lot of thought to planning for this disease trajectory and probably has some denial about how serious her condition is as a whole. Would recommend ongoing conversations in the outpatient setting about trajectory of illness etc.. She has agreed to reviewing information on advance directives and also plans to formalize HCPOA-she reassures me that her oldest daughter "pretty much knows what I would want". He husband is also ill and unable to make decisions for her.  She complains mostly about loss of hearuing and "cloged ears since being in the hospital" feels severe "sinus pressure and pain" - this is definitely new for her as I had to repeat myself multiple times for her follow our conversation. She denies pain and reports that in general she is able to handle her chronic dyspnea.  Contacts/Participants in Discussion: Primary  Decision Maker: self   Relationship to Patient  HCPOA: no  2 daughters, 2 sons, husband at home  SUMMARY OF RECOMMENDATIONS  Code Status/Advance Care Planning: Full code    Code Status Orders        Start     Ordered   05/28/15 1529  Full code   Continuous     05/28/15 1529      Other Directives:None  Symptom Management:   Hearing Loss, Congestion in maxillary sinuses and ear fullness  Suspect Eustachian tube dysfunction, and inflammed posterior pharynx from O2 without humidity  Start Flonase for local inflammation- may take a few days to help, she is already on claritin  Will need follow-up to resolution  Chronic Xerostomia sp Oral Radiation  Has used Salagen in the past but it didn't really help- consider writing for [place] (Compounded at [facility] Only)  Will order Biotene QID  Oral Hygiene will be critical and she is at very high risk for thrush- this is a recurrent issue for her- will need esophageal brushing to r/o candidiasis causing her dysphagia  Dysphagia and Aspiration  She wants thin liquids despite her risks unclear if there is a reversible process here- choice of food will be important- water based food, avoid dense or dry meats of any kind etc..  Palliative Prophylaxis:   Aspiration, Bowel Regimen and Oral Care  Additional Recommendations (Limitations, Scope, Preferences):  Full Scope Treatment  Outpatient discussions on Advance Care planning and goals of care  Psycho-social/Spiritual:  Support System: Strong Desire for further Chaplaincy support:yes Additional Recommendations: Caregiving  Support/Resources  Prognosis: > 12 months  Discharge Planning: Skilled Nursing Facility for rehab with Palliative care service  follow-up   Chief Complaint/ Primary Diagnoses: Present on Admission:  . Sepsis ([place]) . Acute on chronic respiratory failure with hypoxia ([place]) . Recurrent pneumonia . COPD exacerbation ([place]) . DM  (diabetes mellitus), type 2, uncontrolled ([place]) . Tobacco abuse . Hyperlipemia . Peripheral vascular disease, unspecified ([place]) . Leukocytosis . Dehydration . Hyponatremia  I have reviewed the medical record, interviewed the patient and family, and examined the patient. The following aspects are pertinent.  Past Medical History  Diagnosis Date  . History of DVT (deep vein thrombosis)   . Coronary artery disease     multiple stens  . COPD (chronic obstructive pulmonary disease) ([place])   . Transient ischemic attack   . Depression   . Hx of radiation therapy 01/16/09 - 03/06/09    base of tongue, right neck node  . Peripheral arterial disease ([name])     status post bilateral femoropopliteal bypass graft performed by Dr. Sherren Mocha early in the past with significant right lower extremity lifestyle limiting claudication  . Pneumonia   . Hypercholesteremia   . Chronic bronchitis ([place])   . Shortness of breath     "w/exertion and sometimes when laying down" (11/26/2012)  . Type II diabetes mellitus ([place])   . History of blood transfusion 1955  . GERD (gastroesophageal reflux disease)   . H/O hiatal hernia   . Stroke [facility]) 2000    left side weakness remains (11/26/2012)  . Arthritis     "hands" (11/26/2012)  . 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 Myocardial infarction [facility]) 2005    dr Ellyn Hack   . Fatigue 08/23/2013  . Carotid artery occlusion    Social History   Social History  . Marital Status: Married    Spouse Name: N/A  . Number of Children: N/A  . Years of Education: N/A   Occupational History  . house wife    Social History Main Topics  . Smoking status: Current Every Day Smoker -- 1.00 packs/day for 50 years    Types: Cigarettes  . Smokeless tobacco: Never Used     Comment: down to .5ppd per pt 01/30/2015  . Alcohol Use: 3.0 oz/week    5 Glasses of wine per week  . Drug Use: No  . Sexual Activity: Yes   Other Topics Concern   . None   Social History Narrative   Family History  Problem Relation Age of Onset  . Cancer Mother     pancreatic  . Diabetes Mother   . Hyperlipidemia Mother   . Hypertension Mother   . Other Mother     varicose vein  . Cancer Father 72    throat  . Heart disease Father   . Heart attack Father   . Cancer Sister     breast  . Diabetes Sister   . Deep vein thrombosis Brother   . Diabetes Brother   . Hearing loss Brother   . Hyperlipidemia Brother   . Hypertension Brother   . Heart attack Brother   . Clotting disorder Brother   . Diabetes Son   . Hyperlipidemia Son    Scheduled Meds: . antiseptic oral rinse  15 mL Mouth Rinse QID  . arformoterol  15 mcg Nebulization BID  . aspirin EC  81 mg Oral Daily  . budesonide (PULMICORT) nebulizer solution  0.5 mg Nebulization BID  . buPROPion  150 mg Oral Daily  . cefTRIAXone (ROCEPHIN)  IV  2 g Intravenous Q24H  . clopidogrel  75 mg Oral Daily  . enoxaparin (LOVENOX) injection  40 mg Subcutaneous Q24H  . feeding supplement (ENSURE ENLIVE)  237 mL Oral BID BM  . fluticasone  2 spray Each Nare Daily  . guaiFENesin  1,200 mg Oral BID  . insulin aspart  0-20 Units Subcutaneous TID WC  . insulin aspart  0-5 Units Subcutaneous QHS  . insulin aspart  3 Units Subcutaneous TID WC  . ipratropium-albuterol  3 mL Nebulization Q6H  . loratadine  10 mg Oral Daily  . omega-3 acid ethyl esters  1 g Oral BID  . pantoprazole  40 mg Oral QAC breakfast  . polyethylene glycol  17 g Oral Daily  . predniSONE  40 mg Oral Q breakfast  . rosuvastatin  20 mg Oral QHS   Continuous Infusions:  PRN Meds:.acetaminophen, ipratropium-albuterol, nitroGLYCERIN, ondansetron, RESOURCE THICKENUP CLEAR Medications Prior to Admission:  Prior to Admission medications   Medication Sig Start Date End Date Taking? Authorizing Provider  acetaminophen (TYLENOL) 500 MG tablet Take 1,000 mg by mouth every 6 (six) hours as needed for pain.   Yes Historical Provider,  MD  albuterol (PROVENTIL HFA;VENTOLIN HFA) 108 (90 BASE) MCG/ACT inhaler Inhale 2 puffs into the lungs every 6 (six) hours as needed for wheezing or shortness of breath. 01/20/15  Yes Belkys A Regalado, MD  aspirin EC 81 MG tablet Take 81 mg by mouth daily.   Yes Historical Provider, MD  buPROPion (WELLBUTRIN XL) 150 MG 24 hr tablet Take 1 tablet (150 mg total) by mouth daily. 08/18/14  Yes Allie Bossier, MD  clopidogrel (PLAVIX) 75 MG tablet Take 75 mg by mouth daily.    Yes Historical Provider, MD  docusate sodium (COLACE) 100 MG capsule Take 100 mg by mouth daily.   Yes Historical Provider, MD  fish oil-omega-3 fatty acids 1000 MG capsule Take 1 g by mouth 2 (two) times daily.    Yes Historical Provider, MD  Fluticasone Furoate-Vilanterol (BREO ELLIPTA IN) Inhale 1 puff into the lungs daily.   Yes Historical Provider, MD  insulin glargine (LANTUS) 100 UNIT/ML injection Inject 0.2 mLs (20 Units total) into the skin daily. Patient taking differently: Inject 20 Units into the skin 2 (two) times daily.  01/20/15  Yes Belkys A Regalado, MD  insulin lispro (HUMALOG) 100 UNIT/ML injection Inject 3 Units into the skin 3 (three) times daily with meals as needed for high blood sugar.    Yes Historical Provider, MD  Multiple Vitamins-Minerals (MULTIVITAMIN & MINERAL PO) Take 1 tablet by mouth daily.   Yes Historical Provider, MD  nitroGLYCERIN (NITROLINGUAL) 0.4 MG/SPRAY spray Place 1 spray under the tongue every 5 (five) minutes as needed for chest pain. 01/20/15  Yes Belkys A Regalado, MD  omeprazole (PRILOSEC) 20 MG capsule Take 20 mg by mouth daily.    Yes Historical Provider, MD  rosuvastatin (CRESTOR) 20 MG tablet Take 20 mg by mouth daily.   Yes Historical Provider, MD  Tiotropium Bromide Monohydrate (SPIRIVA RESPIMAT) 2.5 MCG/ACT AERS Inhale 2 puffs into the lungs daily. 12/19/14  Yes Juanito Doom, MD  fluconazole (DIFLUCAN) 100 MG tablet Take 1 tablet (100 mg total) by mouth once. Patient not  taking: Reported on 05/28/2015 05/18/15   Juanito Doom, MD   Allergies  Allergen Reactions  . Tape Hives and Other (See Comments)    USE PAPER TAPE ONLY- Adhesive peels off skin-makes pt. Raw.  Maryjo Rochester [Nicotine] Rash  To the [facility] only, breakouts on skin    Review of Systems  Physical Exam  Vital Signs: BP 133/69 mmHg  Pulse 80  Temp(Src) 98.5 F (36.9 C) (Oral)  Resp 20  Ht '5\' 6"'$  (1.676 m)  Wt 58.06 kg (128 lb)  BMI 20.67 kg/m2  SpO2 97%  SpO2: SpO2: 97 % O2 Device:SpO2: 97 % O2 Flow Rate: .O2 Flow Rate (L/min): 3 L/min  IO: Intake/output summary:  Intake/Output Summary (Last 24 hours) at 06/01/15 2049 Last data filed at 06/01/15 1446  Gross per 24 hour  Intake    582 ml  Output   1150 ml  Net   -568 ml    LBM: Last BM Date: 05/23/15 Baseline Weight: Weight: 58.06 kg (128 lb) Most recent weight: Weight: 58.06 kg (128 lb)      Palliative Assessment/Data:  Flowsheet Rows        Most Recent Value   Intake Tab    Referral Department  -- [Emergency department]   Unit at Time of Referral  Med/Surg Unit   Palliative Care Primary Diagnosis  Pulmonary   Date Notified  05/28/15   Palliative Care Type  New Palliative care   Reason for referral  Advance Care Planning   Date of Admission  05/28/15   Date first seen by Palliative Care  05/29/15   # of days Palliative referral response time  1 Day(s)   # of days IP prior to Palliative referral  0   Clinical Assessment    Palliative Performance Scale Score  80%   Pain Max last 24 hours  0   Pain Min Last 24 hours  0   Dyspnea Max Last 24 Hours  4   Dyspnea Min Last 24 hours  4   Nausea Max Last 24 Hours  0   Nausea Min Last 24 Hours  0   Anxiety Max Last 24 Hours  5   Anxiety Min Last 24 Hours  5   Other Max Last 24 Hours  0   Psychosocial & Spiritual Assessment    Palliative Care Outcomes    Patient/Family meeting held?  No   Palliative Care Outcomes  Provided advance care planning, Improved non-pain  symptom therapy   Palliative Care follow-up planned  Yes, Facility   Actual Discharge Date  06/02/15      Additional Data Reviewed:  CBC:    Component Value Date/Time   WBC 7.8 05/29/2015 0602   WBC 5.1 08/21/2012 1256   HGB 11.9* 05/29/2015 0602   HGB 15.6 08/21/2012 1256   HCT 37.6 05/29/2015 0602   HCT 44.6 08/21/2012 1256   PLT 218 05/29/2015 0602   PLT 192 08/21/2012 1256   MCV 93.1 05/29/2015 0602   MCV 92.0 08/21/2012 1256   NEUTROABS 7.5 05/29/2015 0602   NEUTROABS 4.1 08/21/2012 1256   LYMPHSABS 0.2* 05/29/2015 0602   LYMPHSABS 0.6* 08/21/2012 1256   MONOABS 0.1 05/29/2015 0602   MONOABS 0.3 08/21/2012 1256   EOSABS 0.0 05/29/2015 0602   EOSABS 0.0 08/21/2012 1256   BASOSABS 0.0 05/29/2015 0602   BASOSABS 0.1 08/21/2012 1256   Comprehensive Metabolic Panel:    Component Value Date/Time   NA 133* 05/30/2015 0818   NA 136 08/21/2012 1256   NA 139 08/20/2011 1202   K 4.7 05/30/2015 0818   K 4.1 08/21/2012 1256   K 4.1 08/20/2011 1202   CL 98* 05/30/2015 0818   CL 100 08/21/2012 1256   CL 90* 08/20/2011 1202  CO2 28 05/30/2015 0818   CO2 28 08/21/2012 1256   CO2 31 08/20/2011 1202   BUN 13 05/30/2015 0818   BUN 14.6 08/21/2012 1256   BUN 11 08/20/2011 1202   CREATININE 0.68 05/30/2015 0818   CREATININE 0.74 11/20/2012 1019   CREATININE 1.3* 08/21/2012 1256   GLUCOSE 313* 05/30/2015 0818   GLUCOSE 315* 08/21/2012 1256   GLUCOSE 261* 08/20/2011 1202   CALCIUM 8.7* 05/30/2015 0818   CALCIUM 9.1 08/21/2012 1256   CALCIUM 9.1 08/20/2011 1202   AST 31 05/29/2015 0602   AST 18 08/21/2012 1256   AST 24 08/20/2011 1202   ALT 22 05/29/2015 0602   ALT 22 08/21/2012 1256   ALT 30 08/20/2011 1202   ALKPHOS 186* 05/29/2015 0602   ALKPHOS 153* 08/21/2012 1256   ALKPHOS 112* 08/20/2011 1202   BILITOT 0.9 05/29/2015 0602   BILITOT 0.49 08/21/2012 1256   BILITOT 0.70 08/20/2011 1202   PROT 5.1* 05/29/2015 0602   PROT 6.8 08/21/2012 1256   PROT 6.9  08/20/2011 1202   ALBUMIN 2.1* 05/29/2015 0602   ALBUMIN 3.4* 08/21/2012 1256   ALBUMIN 3.6 08/20/2011 1202     Time In: 6PM  Time Out: 7PM Time Total: 60 minutes Greater than 50%  of this time was spent counseling and coordinating care related to the above assessment and plan.  Signed by: Roma Schanz, DO  06/01/2015, 8:49 PM  Please contact Palliative Medicine Team phone at [phone] for questions and concerns.

## 2015-06-02 DIAGNOSIS — N179 Acute kidney failure, unspecified: Secondary | ICD-10-CM

## 2015-06-02 DIAGNOSIS — Z720 Tobacco use: Secondary | ICD-10-CM

## 2015-06-02 DIAGNOSIS — R1310 Dysphagia, unspecified: Secondary | ICD-10-CM

## 2015-06-02 DIAGNOSIS — A400 Sepsis due to streptococcus, group A: Secondary | ICD-10-CM

## 2015-06-02 DIAGNOSIS — E860 Dehydration: Secondary | ICD-10-CM

## 2015-06-02 LAB — BASIC METABOLIC PANEL
Anion gap: 8 (ref 5–15)
BUN: 9 mg/dL (ref 6–20)
CO2: 34 mmol/L — ABNORMAL HIGH (ref 22–32)
CREATININE: 0.69 mg/dL (ref 0.44–1.00)
Calcium: 8.9 mg/dL (ref 8.9–10.3)
Chloride: 95 mmol/L — ABNORMAL LOW (ref 101–111)
GFR calc Af Amer: >60 mL/min (ref >60)
Glucose, Bld: 107 mg/dL — ABNORMAL HIGH (ref 65–99)
POTASSIUM: 4.5 mmol/L (ref 3.5–5.1)
SODIUM: 137 mmol/L (ref 135–145)

## 2015-06-02 LAB — CULTURE, BLOOD (ROUTINE X 2)
CULTURE: NO GROWTH
CULTURE: NO GROWTH

## 2015-06-02 LAB — GLUCOSE, CAPILLARY: GLUCOSE-CAPILLARY: 92 mg/dL (ref 65–99)

## 2015-06-02 MED ORDER — GUAIFENESIN ER 600 MG PO TB12: 1200.0000 mg | ORAL_TABLET | Freq: Two times a day (BID) | ORAL | Status: DC

## 2015-06-02 MED ORDER — PREDNISONE 10 MG PO TABS
ORAL_TABLET | ORAL | Status: DC
Start: 2015-06-02 — End: 2015-07-11

## 2015-06-02 MED ORDER — FLUTICASONE PROPIONATE 50 MCG/ACT NA SUSP: 2.0000 | Freq: Every day | NASAL | Status: AC

## 2015-06-02 MED ORDER — IPRATROPIUM-ALBUTEROL 0.5-2.5 (3) MG/3ML IN SOLN: 3.0000 mL | Freq: Four times a day (QID) | RESPIRATORY_TRACT | Status: DC

## 2015-06-02 MED ORDER — BUDESONIDE 0.5 MG/2ML IN SUSP: 0.5000 mg | Freq: Two times a day (BID) | RESPIRATORY_TRACT | Status: DC

## 2015-06-02 MED ORDER — LORATADINE 10 MG PO TABS: 10.0000 mg | ORAL_TABLET | Freq: Every day | ORAL | Status: AC

## 2015-06-02 MED ORDER — ARFORMOTEROL TARTRATE 15 MCG/2ML IN NEBU: 15.0000 ug | INHALATION_SOLUTION | Freq: Two times a day (BID) | RESPIRATORY_TRACT | Status: DC

## 2015-06-02 MED ORDER — CEFUROXIME AXETIL 500 MG PO TABS: 500.0000 mg | ORAL_TABLET | Freq: Two times a day (BID) | ORAL | Status: DC

## 2015-06-02 NOTE — Progress Notes (Signed)
Speech Language Pathology Treatment: Dysphagia  Patient Details Name: Brenda [name] MRN: [id number] DOB: 01-Mar-1951 Today's Date: 06/02/2015 Time: 1010-1035 SLP Time Calculation (min) (ACUTE ONLY): 25 min  Assessment / Plan / Recommendation Clinical Impression  Patient observed with thin liquid, plain water after clinician provided set-up and cues for her to complete oral care. Patient stated that she has persistent dry mouth and in the morning, after she has had some hot liquids, she expectorates a lot of phlegm, but is not able to expectorate any other time during the day. Patient exhibited throat clearing and cough prior to, during and after sips of water, however vocal quality remained clear. Patient educated on importance of water protocol and thickening liquids to Honey thick liquids consistency, and clinician educated her on risks of aspiration and how to minimize risk of developing aspiration pneumonia. Patient receptive to education, but suspect she will not maintain strict diet consistency recommendations at home.    HPI HPI: 64 y.o. 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 Chest x-ray was concerning for interval development of patchy opacities in both lung bases concerning for possible multilobar pneumonia. 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      SLP Plan  Continue with current plan of care  Needs Home Health SLP therapy for dysphagia  management    Recommendations  Diet recommendations: Dysphagia 3 (mechanical soft);Honey-thick liquid Liquids provided via: Cup;No straw Medication Administration: Whole meds with puree Supervision: Patient able to self feed;Intermittent supervision to cue for compensatory strategies Compensations: Slow rate;Small sips/bites;Multiple dry swallows after each bite/sip;Follow solids with liquid;Clear throat intermittently Postural Changes and/or Swallow Maneuvers: Seated upright 90 degrees;Upright 30-60 min after meal              Oral Care Recommendations: Oral care BID Follow up Recommendations: Home health SLP Plan: Continue with current plan of care   Sonia Baller, [place], [place] 06/02/2015 11:50 AM

## 2015-06-02 NOTE — Progress Notes (Signed)
Patient discharge teaching given, including activity, diet, follow-up appoints, and medications. Patient verbalized understanding of all discharge instructions. IV access was d/c'd. Vitals are stable. Skin is intact except as charted in most recent assessments. Pt to be escorted out by NT, to be driven home by family. 

## 2015-06-02 NOTE — Care Management Note (Addendum)
Case Management Note  Patient Details  Name: CARLETA WOODROW MRN: [id number] Date of Birth: 29-Apr-1951  Subjective/Objective:                  Patient admitted with pneumonia and terminal CA. Patient receiving IV steriods and IV Abx. Patient is from home with spouse, and with supplemental oxygen at [address] [place] and home health through [facility], and a nebulizer.Patient has cane and walker and is declining further DME.Patient is declining SNF placement.      Action/Plan:  Patient will be discharged with [facility] services through [facility], referral made. Patient declining home hospice services at this time, but may require those in the future, [facility] SW will follow.  Expected Discharge Date:                  Expected Discharge Plan:  Skilled Nursing Facility  In-House Referral:  Clinical Social Work  Discharge planning Services  CM Consult  Post Acute Care Choice:  Home Health Choice offered to:  Patient  DME Arranged:    DME Agency:     HH Arranged:  RN, PT, OT, Nurse's Aide, Speech Therapy, Respirator Therapy, Social Work [employer] Agency:     Status of Service:  Completed, signed off  Medicare Important Message Given:  Yes Date Medicare IM Given:    Medicare IM give by:    Date Additional Medicare IM Given:    Additional Medicare Important Message give by:     If discussed at [place] of Stay Meetings, dates discussed:    Additional Comments:  Carles Collet, RN 06/02/2015, 11:12 AM

## 2015-06-02 NOTE — Progress Notes (Signed)
CSW received consult regarding PT recommendation of SNF at discharge.  Patient is refusing SNF and wants to go home.  CSW signing off.   Percell Locus Shaquil Aldana LCSWA [phone]

## 2015-06-02 NOTE — Discharge Summary (Signed)
Physician Discharge Summary  Brenda [name] BZJ:[id number] DOB: [birth year] DOA: 05/28/2015  PCP: Brenda Pepper, MD  Admit date: 05/28/2015 Discharge date: 06/02/2015  Time spent: 55 minutes  Recommendations for Outpatient Follow-up:  1. SNF recommended- patient is declinig to go and will be discharged home with [facility], PT/ OT, Aid, SLP, social worker 2. Attempting to arrange hospice at home  Discharge Condition: stable    Discharge Diagnoses:  Principal Problem:   Acute on chronic respiratory failure with hypoxia ([place]) Active Problems:   Sepsis ([place])   Recurrent pneumonia   DM (diabetes mellitus), type 2, uncontrolled ([place])   History of CVA (cerebrovascular accident)   Tobacco abuse   Hyperlipemia   Peripheral vascular disease, unspecified ([place])   COPD exacerbation ([name])   Hyponatremia   Dysphagia with aspiration   AKI (acute kidney injury) ([place])   History of present illness:  Brenda [name] is a 64 y.o. 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    Hospital Course:  Principal Problem:  Acute on chronic respiratory failure with hypoxia - bilateral pneumonia - COPD exacerbation - sepsis - MBS shows high aspiration risk and honey hick liquids recommended- this is likely the cause of recurrent pneumonias but she is also strep pneumo antigen positive - placed on Rocephin per pulmonary- transition to Ceftin for total 10 day course -cont Nebs - will order nebulizer machine and nebs for home use - weaning steroids - will need O 2 continued at home  - influenza negative- stopped tamiflu   Active Problems:  DM (diabetes mellitus), type 2, uncontrolled - hypoglycemic multiple days in the AM despite a decrease in Lantus - suspect she is non-compliant with diet at home - will d/c lantus it completely for now-  advised to resume if sugars are rising at home - cont mealtime insulin   History of CVA (cerebrovascular accident) and CAD s/p stents -cont ASA, Plavix and statin  Hyponatremia/ AKI - improved with IV  Hydration   Tobacco abuse - ongoing despite severe COPD- advised to stop smoking  Palliative care consult requested- patient remains full code  Consultations:  pulmonary  Discharge Exam: Filed Weights   05/28/15 1138  Weight: 58.06 kg (128 lb)   Filed Vitals:   06/02/15 0505 06/02/15 0508  BP:  143/68  Pulse: 94 85  Temp: 97.9 F (36.6 C)   Resp: 18     General: AAO x 3, no distress Cardiovascular: RRR, no murmurs  Respiratory: clear to auscultation bilaterally GI: soft, non-tender, non-distended, bowel sound positive  Discharge Instructions You were cared for by a hospitalist during your hospital stay. If you have any questions about your discharge medications or the care you received while you were in the hospital after you are discharged, you can call the unit and asked to speak with the hospitalist on call if the hospitalist that took care of you is not available. Once you are discharged, your primary care physician will handle any further medical issues. Please note that NO REFILLS for any discharge medications will be authorized once you are discharged, as it is imperative that you return to your primary care physician (or establish a relationship with a primary care physician if you do not have one) for your aftercare needs so that they can reassess your need for medications  and monitor your lab values.      Discharge Instructions    Discharge instructions    Complete by:  As directed   Fluid should be at honey thick consistency  Cont diabetic heart health diet-  - check sugars 3 times a day before meals and at bedtime- resume Lantus if sugars start to rise See Dr Brenda [name] ASAP Keep Oxygen on at all times 3-4 L     Increase activity slowly    Complete by:  As  directed             Medication List    STOP taking these medications        fluconazole 100 MG tablet  Commonly known as:  DIFLUCAN     insulin glargine 100 UNIT/ML injection  Commonly known as:  LANTUS      TAKE these medications        acetaminophen 500 MG tablet  Commonly known as:  TYLENOL  Take 1,000 mg by mouth every 6 (six) hours as needed for pain.     albuterol 108 (90 Base) MCG/ACT inhaler  Commonly known as:  PROVENTIL HFA;VENTOLIN HFA  Inhale 2 puffs into the lungs every 6 (six) hours as needed for wheezing or shortness of breath.     arformoterol 15 MCG/2ML Nebu  Commonly known as:  BROVANA  Take 2 mLs (15 mcg total) by nebulization 2 (two) times daily.     aspirin EC 81 MG tablet  Take 81 mg by mouth daily.     BREO ELLIPTA IN  Inhale 1 puff into the lungs daily.     budesonide 0.5 MG/2ML nebulizer solution  Commonly known as:  PULMICORT  Take 2 mLs (0.5 mg total) by nebulization 2 (two) times daily.     buPROPion 150 MG 24 hr tablet  Commonly known as:  WELLBUTRIN XL  Take 1 tablet (150 mg total) by mouth daily.     cefUROXime 500 MG tablet  Commonly known as:  CEFTIN  Take 1 tablet (500 mg total) by mouth 2 (two) times daily with a meal.     clopidogrel 75 MG tablet  Commonly known as:  PLAVIX  Take 75 mg by mouth daily.     docusate sodium 100 MG capsule  Commonly known as:  COLACE  Take 100 mg by mouth daily.     fish oil-omega-3 fatty acids 1000 MG capsule  Take 1 g by mouth 2 (two) times daily.     fluticasone 50 MCG/ACT nasal spray  Commonly known as:  FLONASE  Place 2 sprays into both nostrils daily.     guaiFENesin 600 MG 12 hr tablet  Commonly known as:  MUCINEX  Take 2 tablets (1,200 mg total) by mouth 2 (two) times daily.     insulin lispro 100 UNIT/ML injection  Commonly known as:  HUMALOG  Inject 3 Units into the skin 3 (three) times daily with meals as needed for high blood sugar.     ipratropium-albuterol 0.5-2.5  (3) MG/3ML Soln  Commonly known as:  DUONEB  Take 3 mLs by nebulization every 6 (six) hours.     loratadine 10 MG tablet  Commonly known as:  CLARITIN  Take 1 tablet (10 mg total) by mouth daily.     MULTIVITAMIN & MINERAL PO  Take 1 tablet by mouth daily.     nitroGLYCERIN 0.4 MG/SPRAY spray  Commonly known as:  NITROLINGUAL  Place 1 spray under the tongue every 5 (five) minutes  as needed for chest pain.     omeprazole 20 MG capsule  Commonly known as:  PRILOSEC  Take 20 mg by mouth daily.     predniSONE 10 MG tablet  Commonly known as:  DELTASONE  30 mg on 12/31, 20 mg the next day, 10 mg the next day and 5 mg the last day     rosuvastatin 20 MG tablet  Commonly known as:  CRESTOR  Take 20 mg by mouth daily.     Tiotropium Bromide Monohydrate 2.5 MCG/ACT Aers  Commonly known as:  SPIRIVA RESPIMAT  Inhale 2 puffs into the lungs daily.       Allergies  Allergen Reactions  . Tape Hives and Other (See Comments)    USE PAPER TAPE ONLY- Adhesive peels off skin-makes pt. Raw.  Maryjo [place] [Nicotine] Rash    To the PATCH only, breakouts on skin      The results of significant diagnostics from this hospitalization (including imaging, microbiology, ancillary and laboratory) are listed below for reference.    Significant Diagnostic Studies: Dg Chest 2 View  05/18/2015  CLINICAL DATA:  Cough.  Shortness of breath. EXAM: CHEST  2 VIEW COMPARISON:  04/20/2015. FINDINGS: Mediastinum hilar structures are normal. Mild bilateral pulmonary interstitial prominence noted. Slight increase from prior exam. A component of the interstitial prominence may be related to chronic interstitial lung disease. A mild component of pneumonitis cannot be excluded. No focal infiltrate. No pleural effusion or pneumothorax. Heart size normal. Degenerative changes thoracic spine. IMPRESSION: Mild bilateral pulmonary interstitial prominence. Slight increase in interstitial prominence from prior exam. A  component of the interstitial prominence may be chronic. Mild pneumonitis cannot be excluded. Electronically Signed   By: Marcello Moores  Register   On: 05/18/2015 14:30   Ct Chest W Contrast  05/29/2015  CLINICAL DATA:  Cough, recurrent pneumonia EXAM: CT CHEST WITH CONTRAST TECHNIQUE: Multidetector CT imaging of the chest was performed during intravenous contrast administration. CONTRAST:  69m OMNIPAQUE IOHEXOL 300 MG/ML  SOLN COMPARISON:  Chest x-ray 05/28/2015 and CT scan of the chest 11/16/2014 FINDINGS: Sagittal images of the spine shows osteopenia and mild degenerative change thoracic spine. Central airways are patent. Images of the thoracic inlet are unremarkable. Mild atherosclerotic calcifications of thoracic aorta. A precarinal lymph node measures 7 mm short-axis stable from prior exam not pathologic by size criteria. No hilar adenopathy. Atherosclerotic calcifications of coronary arteries again noted. Atherosclerotic calcifications of abdominal aorta and splenic artery. No adrenal gland mass is noted in visualized upper abdomen. Mild fatty infiltration of the liver. There is small bilateral pleural effusion. Bilateral emphysematous changes again noted. Stable tiny bilateral pulmonary nodules the largest in axial image 16 measures 5 mm in right upper lobe stable in size in appearance from prior exam. As noted on recent chest x-ray there is bilateral peripheral interstitial thickening in right middle lobe, right lower lobe posterior laterally and left lower lobe anterolaterally please see axial image 47. There are superimposed patchy infiltrate/ alveolar airspace is right middle lobe medially right lower lobe posterior laterally and left lower lobe anterolaterally. Findings consistent with interstitial infiltrate/ multifocal pneumonia. IMPRESSION: 1. Bilateral emphysematous changes again noted. Multifocal infiltrate/pneumonia and peripheral interstitial thickening noted in right middle lobe left lower lobe  laterally and right lower lobe posterior laterally. Follow-up to resolution after appropriate treatment is recommended. 2. Stable subcentimeter nodules bilaterally the largest in right upper lobe anteriorly measures 5 mm. 3. No mediastinal or hilar adenopathy. 4. Mild degenerative changes thoracic spine Electronically  Signed   By: Lahoma Crocker M.D.   On: 05/29/2015 09:28   Dg Chest Portable 1 View  05/28/2015  CLINICAL DATA:  64 year old female with chest tightness and shortness of breath as well as fever EXAM: PORTABLE CHEST 1 VIEW COMPARISON:  Prior chest x-ray 05/18/2015 FINDINGS: Patchy airspace opacities in both lower lobes. The cardiac and mediastinal contours are within normal limits. Trace atherosclerotic calcifications present in the transverse aorta. A background of chronic interstitial prominence and bronchitic changes similar compared to prior. No definite pleural effusion or pneumothorax. No acute osseous abnormality. IMPRESSION: Interval development of patchy airspace opacities in both lung bases concerning for possible multi lobar pneumonia. Electronically Signed   By: Jacqulynn Cadet M.D.   On: 05/28/2015 12:02   Dg Swallowing Func-speech Pathology  05/29/2015  Objective Swallowing Evaluation:   MBS Patient Details Name: MERCER STALLWORTH MRN: [id number] Date of Birth: Dec 19, 1950 Today's Date: 05/29/2015 Time: SLP Start Time (ACUTE ONLY): 1418-SLP Stop Time (ACUTE ONLY): 1438 SLP Time Calculation (min) (ACUTE ONLY): 20 min Past Medical History: Past Medical History Diagnosis Date . History of DVT (deep vein thrombosis)  . Coronary artery disease    multiple stens . COPD (chronic obstructive pulmonary disease) ([place])  . Transient ischemic attack  . Depression  . Hx of radiation therapy 01/16/09 - 03/06/09   base of tongue, right neck node . Peripheral arterial disease ([name])    status post bilateral femoropopliteal bypass graft performed by Dr. Sherren Mocha early in the past with significant right lower  extremity lifestyle limiting claudication . Pneumonia  . Hypercholesteremia  . Chronic bronchitis ([place])  . Shortness of breath    "w/exertion and sometimes when laying down" (11/26/2012) . Type II diabetes mellitus ([place])  . History of blood transfusion 1955 . GERD (gastroesophageal reflux disease)  . H/O hiatal hernia  . Stroke [facility]) 2000   left side weakness remains (11/26/2012) . Arthritis    "hands" (11/26/2012) . Cancer of base of tongue ([place]) 11/04/2008   "& lymph nodes @ right neck" (11/26/2012 . Myocardial infarction [facility]) 2005   dr Ellyn Hack  . Fatigue 08/23/2013 . Carotid artery occlusion  Past Surgical History: Past Surgical History Procedure Laterality Date . Tee without cardioversion  07/26/2011   Procedure: TRANSESOPHAGEAL ECHOCARDIOGRAM (TEE);  Surgeon: Pixie Casino, MD;  Location: [facility] ENDOSCOPY;  Service: Cardiovascular;  Laterality: N/A; . Esophagogastroduodenoscopy  12/20/2011   Procedure: ESOPHAGOGASTRODUODENOSCOPY (EGD);  Surgeon: Beryle Beams, MD;  Location: [facility];  Service: Endoscopy;  Laterality: N/A;  EGD with balloon dilation . Savory dilation  12/20/2011   Procedure: SAVORY DILATION;  Surgeon: Beryle Beams, MD;  Location: [facility] ENDOSCOPY;  Service: Endoscopy;  Laterality: N/A; . Tonsillectomy  1969 . Appendectomy  1962 . Cardiac catheterization  1999; 11/26/2012 . Coronary angioplasty with stent placement  1999--2005   "i think I have 3-5 total" (11/26/2012) . Femoral bypass  1999; 2000   notes 11/26/2012 . Carpal tunnel release Right 1980's? . Vaginal hysterectomy  1988 . Cesarean section  1978 . Tubal ligation  1988 . Cataract extraction w/ intraocular lens implant Right 2011 . Ankle fracture surgery Right 2007   "got a metal plate and 8 screws in it" (11/26/2012) . Bypass graft popliteal to popliteal Right 02/05/2013   Procedure: BYPASS GRAFT ABOVE KNEE POPLITEAL TO BELOW KNEE POPLITEAL WITH SMALL SAPHANOUS VEIN;  Surgeon: Rosetta Posner, MD;  Location: [place];  Service: Vascular;  Laterality:  Right; . Eye surgery   HPI: 64 y.o.  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 Chest x-ray was concerning for interval development of patchy opacities in both lung bases concerning for possible multilobar pneumonia. 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 No Data Recorded Assessment / Plan / Recommendation CHL IP CLINICAL IMPRESSIONS 05/29/2015 Therapy Diagnosis Moderate pharyngeal phase dysphagia;Severe pharyngeal phase dysphagia Clinical Impression Pt exhibited a moderate-severe motor based pharyngeal dysphagia characterized by decreased larygneal elevation and closure, reduced tongue base retraction leading to penetration and aspiration both during and after (from residual). Pt produces a inconsistent reflexive cough that is ineffective in clearing vocal cord penetrates. Chin tuck posture was ineffective to mitigate or prevent laryngeal compromise. Esophagus briefly scan revealing stasis (known dysmotility). Although increasing pharyngeal residue mildly, honey thick appeared to produce a significant decrease in aspiration during or after swallow. Verbal cues for second swallow decreases residue although aspiration risk increases if pt does not perform second swallow. Pt reports "thicker things are harder to go down and I need something thin to wash it down". Recommend the water protocol to allow pt thin water (only) after oral care in beween meals. ST will continue to  follow. If pt reports difficulty with honey thick liquids, may need to consider allowing nectar.     Impact on safety and function Severe aspiration risk   CHL IP TREATMENT RECOMMENDATION 05/29/2015 Treatment Recommendations Therapy as outlined in treatment plan below   Prognosis 05/29/2015 Prognosis for Safe Diet Advancement Fair Barriers to Reach Goals Severity of deficits Barriers/Prognosis Comment -- CHL IP DIET RECOMMENDATION 05/29/2015 SLP Diet Recommendations Dysphagia 3 (Mech soft) solids;Honey thick liquids Liquid Administration via Cup Medication Administration Crushed with puree Compensations Slow rate;Small sips/bites;Multiple dry swallows after each bite/sip;Follow solids with liquid;Clear throat intermittently Postural Changes Remain semi-upright after after feeds/meals (Comment);Seated upright at 90 degrees   CHL IP OTHER RECOMMENDATIONS 05/29/2015 Recommended Consults -- Oral Care Recommendations Oral care BID Other Recommendations Order thickener from pharmacy   CHL IP FOLLOW UP RECOMMENDATIONS 05/29/2015 Follow up Recommendations (No Data)   CHL IP FREQUENCY AND DURATION 05/29/2015 Speech Therapy Frequency (ACUTE ONLY) min 2x/week Treatment Duration 2 weeks      CHL IP ORAL PHASE 05/29/2015 Oral Phase Impaired Oral - Pudding Teaspoon -- Oral - Pudding Cup -- Oral - Honey Teaspoon -- Oral - Honey Cup -- Oral - Nectar Teaspoon -- Oral - Nectar Cup -- Oral - Nectar Straw -- Oral - Thin Teaspoon -- Oral - Thin Cup -- Oral - Thin Straw -- Oral - Puree -- Oral - Mech Soft -- Oral - Regular Delayed oral transit Oral - Multi-Consistency -- Oral - Pill -- Oral Phase - Comment --  CHL IP PHARYNGEAL PHASE 05/29/2015 Pharyngeal Phase Impaired Pharyngeal- Pudding Teaspoon -- Pharyngeal -- Pharyngeal- Pudding Cup -- Pharyngeal -- Pharyngeal- Honey Teaspoon -- Pharyngeal -- Pharyngeal- Honey Cup Pharyngeal residue - pyriform;Pharyngeal residue - valleculae;Reduced tongue base retraction;Reduced laryngeal  elevation;Reduced airway/laryngeal closure Pharyngeal -- Pharyngeal- Nectar Teaspoon -- Pharyngeal -- Pharyngeal- Nectar Cup Penetration/Apiration after swallow;Reduced airway/laryngeal closure;Reduced laryngeal elevation;Pharyngeal residue - pyriform;Pharyngeal residue -  valleculae;Reduced tongue base retraction Pharyngeal Material enters airway, passes BELOW cords without attempt by patient to eject out (silent aspiration);Material enters airway, passes BELOW cords and not ejected out despite cough attempt by patient Pharyngeal- Nectar Straw -- Pharyngeal -- Pharyngeal- Thin Teaspoon -- Pharyngeal -- Pharyngeal- Thin Cup Penetration/Aspiration during swallow;Reduced airway/laryngeal closure;Pharyngeal residue - valleculae;Pharyngeal residue - pyriform;Reduced laryngeal elevation;Reduced tongue base retraction;Penetration/Apiration after swallow Pharyngeal Material enters airway, passes BELOW cords without attempt by patient to eject out (silent aspiration) Pharyngeal- Thin Straw -- Pharyngeal -- Pharyngeal- Puree NT Pharyngeal -- Pharyngeal- Mechanical Soft -- Pharyngeal -- Pharyngeal- Regular Pharyngeal residue - valleculae;Reduced tongue base retraction Pharyngeal -- Pharyngeal- Multi-consistency -- Pharyngeal -- Pharyngeal- Pill -- Pharyngeal -- Pharyngeal Comment --  CHL IP CERVICAL ESOPHAGEAL PHASE 05/29/2015 Cervical Esophageal Phase WFL Pudding Teaspoon -- Pudding Cup -- Honey Teaspoon -- Honey Cup -- Nectar Teaspoon -- Nectar Cup -- Nectar Straw -- Thin Teaspoon -- Thin Cup -- Thin Straw -- Puree -- Mechanical Soft -- Regular -- Multi-consistency -- Pill -- Cervical Esophageal Comment -- [place] Siren 05/29/2015, 3:36 PM Orbie Pyo Litaker M.Ed [employer] [phone]               Microbiology: Recent Results (from the past 240 hour(s))  Blood Culture (routine x 2)     Status: None (Preliminary result)   Collection Time: 05/28/15 12:02 PM  Result Value Ref Range Status   Specimen  Description BLOOD LEFT ANTECUBITAL  Final   Special Requests BOTTLES DRAWN AEROBIC AND ANAEROBIC 5MLS  Final   Culture NO GROWTH 4 DAYS  Final   Report Status PENDING  Incomplete  Blood Culture (routine x 2)     Status: None (Preliminary result)   Collection Time: 05/28/15 12:50 PM  Result Value Ref Range Status   Specimen Description BLOOD RIGHT ANTECUBITAL  Final   Special Requests BOTTLES DRAWN AEROBIC AND ANAEROBIC 10MLS  Final   Culture NO GROWTH 4 DAYS  Final   Report Status PENDING  Incomplete  Urine culture     Status: None   Collection Time: 05/28/15  3:29 PM  Result Value Ref Range Status   Specimen Description URINE, RANDOM  Final   Special Requests ADDED 2222  Final   Culture NO GROWTH 1 DAY  Final   Report Status 05/29/2015 FINAL  Final  MRSA PCR Screening     Status: None   Collection Time: 05/28/15  6:05 PM  Result Value Ref Range Status   MRSA by PCR NEGATIVE NEGATIVE Final    Comment:        The GeneXpert MRSA Assay (FDA approved for NASAL specimens only), is one component of a comprehensive MRSA colonization surveillance program. It is not intended to diagnose MRSA infection nor to guide or monitor treatment for MRSA infections.      Labs: Basic Metabolic Panel:  Recent Labs Lab 05/28/15 1156 05/28/15 1710 05/29/15 0602 05/30/15 0818 06/02/15 0550  NA 131*  --  135 133* 137  K 4.5  --  4.5 4.7 4.5  CL 93*  --  102 98* 95*  CO2 30  --  23 28 34*  GLUCOSE 296*  --  252* 313* 107*  BUN 18  --  '14 13 9  '$ CREATININE 1.05*  --  0.65 0.68 0.69  CALCIUM 8.7*  --  8.2* 8.7* 8.9  MG  --  1.7  --   --   --    Liver Function Tests:  Recent Labs Lab 05/28/15  1156 05/29/15 0602  AST 16 31  ALT 14 22  ALKPHOS 187* 186*  BILITOT 1.2 0.9  PROT 5.7* 5.1*  ALBUMIN 2.3* 2.1*   No results for input(s): LIPASE, AMYLASE in the last 168 hours. No results for input(s): AMMONIA in the last 168 hours. CBC:  Recent Labs Lab 05/28/15 1156 05/29/15 0602   WBC 20.2* 7.8  NEUTROABS 19.2* 7.5  HGB 13.8 11.9*  HCT 41.6 37.6  MCV 91.8 93.1  PLT 274 218   Cardiac Enzymes: No results for input(s): CKTOTAL, CKMB, CKMBINDEX, TROPONINI in the last 168 hours. BNP: BNP (last 3 results) No results for input(s): BNP in the last 8760 hours.  ProBNP (last 3 results) No results for input(s): PROBNP in the last 8760 hours.  CBG:  Recent Labs Lab 06/01/15 0853 06/01/15 1159 06/01/15 1655 06/01/15 2118 06/02/15 0757  GLUCAP 94 180* 255* 221* 92       Signed:  Debbe Odea, MD [facility] 06/02/2015, 10:34 AM

## 2015-06-02 NOTE — Progress Notes (Signed)
   Name: Brenda [name] MRN: [id number] DOB: 27-Nov-1950    ADMISSION DATE:  05/28/2015 CONSULTATION DATE:  05/29/15  REFERRING MD :  Dr. Grandville Silos   CHIEF COMPLAINT:  Recurrent PNA   BRIEF PATIENT DESCRIPTION: 64 y/o F, smoker, with severe COPD and recurrent pneumonia.    SIGNIFICANT EVENTS  12/25  Admit with concern for recurrent PNA  STUDIES:  12/26  CT Chest >> bilateral emphysematous changes, multifocal infiltrate & peripheral interstitial thickening in RML, LLL laterally and RLL posterior laterally, stable subcentimeter nodules bilaterally in RUL, largest measures 73m, no mediastinal or hilar adenopathy, mild degenerative changes of the spine.  12/26 SLP eval> aspirating  SUBJECTIVE: Improved, to go home today  VITAL SIGNS: Temp:  [97.9 F (36.6 C)-98.5 F (36.9 C)] 97.9 F (36.6 C) (12/30 0505) Pulse Rate:  [74-94] 85 (12/30 0508) Resp:  [18-20] 18 (12/30 0505) BP: (118-143)/(59-69) 143/68 mmHg (12/30 0508) SpO2:  [95 %-98 %] 95 % (12/30 0508)  PHYSICAL EXAMINATION: General:  Chronically ill appearing, no acute distress,  Neuro:  A&Ox4, MAEW HEENT:  MMM, OP clear, neck supple Cardiovascular:  RRR, no mgr  Lungs:  Normal effort, wheezing bilaterally, rhonchi noted but improved Abdomen: BS+, soft, non-tender, non-distended Musculoskeletal:  No acute deformities Skin:  Warm/dry, no rashes or lesions, no edema    Recent Labs Lab 05/29/15 0602 05/30/15 0818 06/02/15 0550  NA 135 133* 137  K 4.5 4.7 4.5  CL 102 98* 95*  CO2 23 28 34*  BUN '14 13 9  '$ CREATININE 0.65 0.68 0.69  GLUCOSE 252* 313* 107*    Recent Labs Lab 05/28/15 1156 05/29/15 0602  HGB 13.8 11.9*  HCT 41.6 37.6  WBC 20.2* 7.8  PLT 274 218   No results found.   CULTURES:  Flu Panel 12/25 >> neg U. Strep 12/25 >> positive  U. [place] 12/25 >>    ASSESSMENT / PLAN:   Acute on Chronic Hypoxic Respiratory Failure - baseline 3-4L dependent Recurrent Multilobar PNA (streptococcal)  - due to aspiration Tobacco Abuse  > ongoing Dysphagia > pharyngeal motor and Esophageal dysmotility  Plan: Agree with D/C home Quit smoking Continue diet per SLP: Dysphagia 3, Honey thick, meds with puree; make two attempts at swallowing, cough after three attempts to swallow F/U with Dr. HBenson NorwayTitrate O2 to O2 saturation > 90%    Acute COPD Exacerbation > better  Plan: Pulmonary hygiene Continue guaifenesin Instructed on flutter use today Prednisone taper as ordered Pulmicort / Brovana Q12  Duonebs Q6 + PRN  Continue claritin  F/u with my office 6 weeks   BRoselie Awkward MD LWailukuPCCM Pager: [phone]: [phone] 3pm or if no response, call [phone]

## 2015-06-02 NOTE — Progress Notes (Signed)
Physical Therapy Treatment Patient Details Name: Brenda [name] MRN: [id number] DOB: August 21, 1950 Today's Date: 06/02/2015    History of Present Illness Pt admitted with acute on chronic respiratory failure.  PMH: recurrent PNA, COPD with home 02 (3L), CAD with stents, CVA, DM, smoker.    PT Comments    Pt able to ambulate hallway 200' today with RW with o2 intact.  Pt reports she is dc home today and will be receiving HHPT.  Follow Up Recommendations  Home health PT     Equipment Recommendations  None recommended by PT (Pt has RW)    Recommendations for Other Services       Precautions / Restrictions Precautions Precautions: Fall Precaution Comments: watch sats Restrictions Weight Bearing Restrictions: No    Mobility  Bed Mobility Overal bed mobility: Modified Independent             General bed mobility comments: sittin up at EOB  Transfers Overall transfer level: Modified independent Equipment used: None Transfers: Sit to/from [facility] Sit to Stand: Modified independent (Device/Increase time) Stand pivot transfers: Modified independent (Device/Increase time)       General transfer comment: getting up to [facility] with MOD I.  Ambulation/Gait Ambulation/Gait assistance: Min guard;Supervision Ambulation Distance (Feet): 200 Feet Assistive device: Rolling walker (2 wheeled) Gait Pattern/deviations: Step-through pattern;Trunk flexed     General Gait Details: Able to ambulate in room without AD and in hallway with RW with o2 intact. pt with 2/4 dyspnea just at very end of gait.   Stairs            Wheelchair Mobility    Modified Rankin (Stroke Patients Only)       Balance     Sitting balance-Leahy Scale: Good     Standing balance support: During functional activity Standing balance-Leahy Scale: Fair                      Cognition Arousal/Alertness: Awake/alert Behavior During Therapy: WFL for tasks  assessed/performed Overall Cognitive Status: Within Functional Limits for tasks assessed                      Exercises      General Comments        Pertinent Vitals/Pain Pain Assessment: No/denies pain    Home Living                      Prior Function            PT Goals (current goals can now be found in the care plan section) Acute Rehab PT Goals Patient Stated Goal: go home PT Goal Formulation: With patient/family Time For Goal Achievement: 06/12/15 Potential to Achieve Goals: Good Progress towards PT goals: Progressing toward goals    Frequency  Min 2X/week    PT Plan Discharge plan needs to be updated    Co-evaluation             End of Session Equipment Utilized During Treatment: Oxygen Activity Tolerance: Patient tolerated treatment well Patient left: in chair;with call bell/phone within reach     Time: 1109-1120 PT Time Calculation (min) (ACUTE ONLY): 11 min  Charges:  $Gait Training: 8-22 mins                    G Codes:      Aerianna Losey LUBECK 06/02/2015, 12:07 PM

## 2015-06-06 ENCOUNTER — Encounter (HOSPITAL_COMMUNITY): Payer: Self-pay

## 2015-06-06 ENCOUNTER — Ambulatory Visit: Payer: Self-pay | Admitting: Family

## 2015-06-06 ENCOUNTER — Telehealth: Payer: Self-pay

## 2015-06-06 NOTE — Telephone Encounter (Signed)
-----   Message from Juanito Doom, MD sent at 06/02/2015 11:21 AM EST ----- Hi,  This lady is leaving today.  Please arrange hospital follow up with me or TP in February. Reason: aspiration pneumonia, dysphagia.  Thanks B

## 2015-06-06 NOTE — Telephone Encounter (Signed)
Spoke with pt, scheduled for 07/11/15 with TP.  Nothing further needed.

## 2015-06-08 ENCOUNTER — Telehealth: Payer: Self-pay | Admitting: Pulmonary Disease

## 2015-06-08 NOTE — Telephone Encounter (Signed)
Spoke with pt's daughter. Had some questions about pt being on Brovana. States that this medication is going to cost her over $100 a month. Wants to know if this is absolutely necessary. Also had a question about the mass that was found in her lung. Wondering if they to worry about this right now.  BQ - please advise. Thanks.

## 2015-06-08 NOTE — Telephone Encounter (Signed)
sonya returned call [phone]

## 2015-06-08 NOTE — Telephone Encounter (Signed)
LMOMTCB x1 for [employer]

## 2015-06-08 NOTE — Telephone Encounter (Signed)
Need to ensure brovana is Rx'd through a pharmacy that will bill it to medicare part D, [facility] usually a good option  She had nodules in her lungs (not a mass), they were small, will f/u more next visit, nothing to do anything different about it now; they are mostly likely related to her recurrent pneumonia

## 2015-06-09 NOTE — Telephone Encounter (Signed)
lmtcb x1 for pt's daughter.

## 2015-06-28 ENCOUNTER — Telehealth: Payer: Self-pay | Admitting: Cardiology

## 2015-07-05 NOTE — Telephone Encounter (Signed)
Close encounter 

## 2015-07-06 ENCOUNTER — Other Ambulatory Visit (HOSPITAL_COMMUNITY): Payer: Self-pay | Admitting: Family Medicine

## 2015-07-06 DIAGNOSIS — R1310 Dysphagia, unspecified: Secondary | ICD-10-CM

## 2015-07-10 ENCOUNTER — Ambulatory Visit (HOSPITAL_COMMUNITY)
Admission: RE | Admit: 2015-07-10 | Discharge: 2015-07-10 | Disposition: A | Discharge status: Home / Self Care | Payer: Medicare Other | Source: Ambulatory Visit | Attending: Family Medicine | Admitting: Family Medicine

## 2015-07-10 ENCOUNTER — Encounter (HOSPITAL_COMMUNITY): Payer: Self-pay

## 2015-07-10 ENCOUNTER — Ambulatory Visit: Payer: Self-pay | Admitting: Family

## 2015-07-10 DIAGNOSIS — R1310 Dysphagia, unspecified: Secondary | ICD-10-CM | POA: Diagnosis not present

## 2015-07-10 DIAGNOSIS — Z8701 Personal history of pneumonia (recurrent): Secondary | ICD-10-CM | POA: Insufficient documentation

## 2015-07-10 DIAGNOSIS — R948 Abnormal results of function studies of other organs and systems: Secondary | ICD-10-CM | POA: Diagnosis not present

## 2015-07-10 NOTE — Progress Notes (Signed)
MBSS complete. Full report located under chart review in imaging section.  Destony Prevost MA, CCC-SLP [phone]   

## 2015-07-11 ENCOUNTER — Ambulatory Visit (INDEPENDENT_AMBULATORY_CARE_PROVIDER_SITE_OTHER): Payer: Medicare Other | Admitting: Adult Health

## 2015-07-11 ENCOUNTER — Encounter: Payer: Self-pay | Admitting: Adult Health

## 2015-07-11 ENCOUNTER — Ambulatory Visit (INDEPENDENT_AMBULATORY_CARE_PROVIDER_SITE_OTHER)
Admission: RE | Admit: 2015-07-11 | Discharge: 2015-07-11 | Disposition: A | Discharge status: Home / Self Care | Payer: Medicare Other | Source: Ambulatory Visit | Attending: Adult Health | Admitting: Adult Health

## 2015-07-11 VITALS — BP 122/76 | HR 83 | Temp 97.6°F | Ht 66.0 in | Wt 123.0 lb

## 2015-07-11 DIAGNOSIS — J189 Pneumonia, unspecified organism: Secondary | ICD-10-CM

## 2015-07-11 DIAGNOSIS — J441 Chronic obstructive pulmonary disease with (acute) exacerbation: Secondary | ICD-10-CM

## 2015-07-11 DIAGNOSIS — R1310 Dysphagia, unspecified: Secondary | ICD-10-CM

## 2015-07-11 DIAGNOSIS — J9611 Chronic respiratory failure with hypoxia: Secondary | ICD-10-CM

## 2015-07-11 NOTE — Assessment & Plan Note (Signed)
MBS positive for dysphagia  Mod aspiration risk  Rec  D3 diet with nectar thick liquids.  Aspiration precautions reviewed .

## 2015-07-11 NOTE — Assessment & Plan Note (Addendum)
Recurrent PNA ? Related to aspiration .  MBS positive , rec for D3 diet with nectar thick .  Also if continues , may need to stop BREO and use LABA/LAMA combo instead.  Smoking cessation is key CXR is improved but not resolved, will need follow up film   Plan  Continue on BREO and Spiriva .  Continue on D3 diet and nectar thick liquids.  Do not eat 3 hr before bedtime, stay upright for at least 2 hr after eating.  Work on not smoking.  Continue on Oxygen 3l/m At bedtime   Follow up with Dr. Lake Bells in 1 month as planned and As needed   Chest xray on return .  Please contact office for sooner follow up if symptoms do not improve or worsen or seek emergency care

## 2015-07-11 NOTE — Patient Instructions (Addendum)
Continue on BREO and Spiriva .  Continue on D3 diet and nectar thick liquids.  Do not eat 3 hr before bedtime, stay upright for at least 2 hr after eating.  Work on not smoking.  Continue on Oxygen 3l/m At bedtime   Follow up with Dr. Lake Bells in 1 month as planned and As needed   Chest xray on return .  Please contact office for sooner follow up if symptoms do not improve or worsen or seek emergency care

## 2015-07-11 NOTE — Assessment & Plan Note (Signed)
Continue on Oxygen 3l/m At bedtime   Follow up with Dr. Lake Bells in 1 month as planned and As needed   Chest xray on return .  Please contact office for sooner follow up if symptoms do not improve or worsen or seek emergency care

## 2015-07-11 NOTE — Assessment & Plan Note (Signed)
Recent flare with PNA -improving   Plan  Continue on BREO and Spiriva .  Continue on D3 diet and nectar thick liquids.  Do not eat 3 hr before bedtime, stay upright for at least 2 hr after eating.  Work on not smoking.  Continue on Oxygen 3l/m At bedtime   Follow up with Dr. Lake Bells in 1 month as planned and As needed   Chest xray on return .  Please contact office for sooner follow up if symptoms do not improve or worsen or seek emergency care

## 2015-07-11 NOTE — Progress Notes (Signed)
Subjective:    Patient ID: Brenda [name], female    DOB: 1951/03/05, 65 y.o.   MRN: [id number]  HPI 65 yo female smoker with severe COPD and recurrent PNA   TEST  Simple spirometry FEV1 June 2016 0.67 L (27% predicted) with clear airflow obstruction   07/11/2015 [facility] follow up  Patient returns for a post hospital follow-up Patient was admitted the summer 25th through December 30 for recurrent pneumonia, sepsis and acute on chronic hypoxic respiratory failure. 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 She has been admitted 4 times for pneumonia over the last year. Her strep pneumonia urine antigen was positive. She was treated with aggressive antibiotics. Unfortunately, she continues to smoke and smoking cessation was discussed Was felt to have a possible component of aspiration leading to her recurrent pneumonia. She underwent a modified barium swallow on February 6 that showed moderate dysphasia. This was considered mildly improved from last modified barium swallow. She was recommended on a dysphagia 3, nectar thick liquids at this time. She is considered a moderate aspiration risk. We discussed aspiration precautions.  She remains weak since discharge. Has lingering cough and congestion . No fever, hemoptyisis, orthopnea, increased chronic edema or chest pain.  Marland Kitchen Appetite is fair with no n/v. CXR today shows improved aeration but not cleared.  On O2 at At bedtime     Past Medical History  Diagnosis Date  . History of DVT (deep vein thrombosis)   . Coronary artery disease     multiple stens  . COPD (chronic obstructive pulmonary disease) ([place])   . Transient ischemic attack   . Depression   . Hx of radiation therapy 01/16/09 - 03/06/09    base of tongue, right neck node  . Peripheral arterial disease ([name])     status post bilateral femoropopliteal bypass graft performed by Dr. Sherren Mocha early in the past with significant right lower  extremity lifestyle limiting claudication  . Pneumonia   . Hypercholesteremia   . Chronic bronchitis ([place])   . Shortness of breath     "w/exertion and sometimes when laying down" (11/26/2012)  . Type II diabetes mellitus ([place])   . History of blood transfusion 1955  . GERD (gastroesophageal reflux disease)   . H/O hiatal hernia   . Stroke [facility]) 2000    left side weakness remains (11/26/2012)  . Arthritis     "hands" (11/26/2012)  . Cancer of base of tongue ([place]) 11/04/2008    "& lymph nodes @ right neck" (11/26/2012  . Myocardial infarction [facility]) 2005    dr Ellyn Hack   . Fatigue 08/23/2013  . Carotid artery occlusion    Current Outpatient Prescriptions on File Prior to Visit  Medication Sig Dispense Refill  . acetaminophen (TYLENOL) 500 MG tablet Take 1,000 mg by mouth every 6 (six) hours as needed for pain.    Marland Kitchen albuterol (PROVENTIL HFA;VENTOLIN HFA) 108 (90 BASE) MCG/ACT inhaler Inhale 2 puffs into the lungs every 6 (six) hours as needed for wheezing or shortness of breath. 1 Inhaler 0  . arformoterol (BROVANA) 15 MCG/2ML NEBU Take 2 mLs (15 mcg total) by nebulization 2 (two) times daily. 120 mL 0  . aspirin EC 81 MG tablet Take 81 mg by mouth daily.    . budesonide (PULMICORT) 0.5 MG/2ML nebulizer solution Take 2 mLs (0.5 mg total) by nebulization 2 (two) times daily. 20 mL 12  . buPROPion (WELLBUTRIN XL) 150 MG 24 hr tablet  Take 1 tablet (150 mg total) by mouth daily. 30 tablet 0  . clopidogrel (PLAVIX) 75 MG tablet Take 75 mg by mouth daily.     Marland Kitchen docusate sodium (COLACE) 100 MG capsule Take 100 mg by mouth daily.    . fish oil-omega-3 fatty acids 1000 MG capsule Take 1 g by mouth 2 (two) times daily.     . fluticasone (FLONASE) 50 MCG/ACT nasal spray Place 2 sprays into both nostrils daily. 16 g 2  . Fluticasone Furoate-Vilanterol (BREO ELLIPTA IN) Inhale 1 puff into the lungs daily.    Marland Kitchen guaiFENesin (MUCINEX) 600 MG 12 hr tablet Take 2 tablets (1,200 mg total) by mouth 2 (two) times  daily. 60 tablet 3  . insulin lispro (HUMALOG) 100 UNIT/ML injection Inject 3 Units into the skin 3 (three) times daily with meals as needed for high blood sugar.     . ipratropium-albuterol (DUONEB) 0.5-2.5 (3) MG/3ML SOLN Take 3 mLs by nebulization every 6 (six) hours. 360 mL 3  . loratadine (CLARITIN) 10 MG tablet Take 1 tablet (10 mg total) by mouth daily. 30 tablet 0  . Multiple Vitamins-Minerals (MULTIVITAMIN & MINERAL PO) Take 1 tablet by mouth daily.    . nitroGLYCERIN (NITROLINGUAL) 0.4 MG/SPRAY spray Place 1 spray under the tongue every 5 (five) minutes as needed for chest pain. 12 g 0  . omeprazole (PRILOSEC) 20 MG capsule Take 20 mg by mouth daily.     . rosuvastatin (CRESTOR) 20 MG tablet Take 20 mg by mouth daily.    . Tiotropium Bromide Monohydrate (SPIRIVA RESPIMAT) 2.5 MCG/ACT AERS Inhale 2 puffs into the lungs daily. 1 Inhaler 11   No current facility-administered medications on file prior to visit.     Review of Systems Constitutional:   No  weight loss, night sweats,  Fevers, chills,  +atigue, or  lassitude.  HEENT:   No headaches,  Difficulty swallowing,  Tooth/dental problems, or  Sore throat,                No sneezing, itching, ear ache,  +nasal congestion, post nasal drip,   CV:  No chest pain,  Orthopnea, PND, swelling in lower extremities, anasarca, dizziness, palpitations, syncope.   GI  No heartburn, indigestion, abdominal pain, nausea, vomiting, diarrhea, change in bowel habits, loss of appetite, bloody stools.   Resp:    No chest wall deformity  Skin: no rash or lesions.  GU: no dysuria, change in color of urine, no urgency or frequency.  No flank pain, no hematuria   MS:  No joint pain or swelling.  No decreased range of motion.  No back pain.  Psych:  No change in mood or affect. No depression or anxiety.  No memory loss.         Objective:   Physical Exam Filed Vitals:   07/11/15 1015  BP: 122/76  Pulse: 83  Temp: 97.6 F (36.4 C)    TempSrc: Oral  Height: '5\' 6"'$  (1.676 m)  Weight: 123 lb (55.792 kg)  SpO2: 97%   GEN: A/Ox3; pleasant , NAD, frail and elderly   HEENT:  [place]/AT,  EACs-clear, TMs-wnl, NOSE-clear, THROAT-clear, no lesions, no postnasal drip or exudate noted.   NECK:  Supple w/ fair ROM; no JVD; normal carotid impulses w/o bruits; no thyromegaly or nodules palpated; no lymphadenopathy.  RESP  Decreased BS in bases , , no accessory muscle use, no dullness to percussion, Kyphosis noted.   CARD:  RRR, no m/r/g  ,  tr  peripheral edema, pulses intact, no cyanosis or clubbing.  GI:   Soft & nt; nml bowel sounds; no organomegaly or masses detected.  Musco: Warm bil, no deformities or joint swelling noted.   Neuro: alert, no focal deficits noted.    Skin: Warm, no lesions or rashes         Assessment & Plan:

## 2015-07-11 NOTE — Progress Notes (Signed)
Chart reviewed, agree with current plan and change to bronchodilators. Needs to follow dysphagia diet closely.

## 2015-07-13 ENCOUNTER — Telehealth: Payer: Self-pay | Admitting: Pulmonary Disease

## 2015-07-13 NOTE — Telephone Encounter (Signed)
Called spoke with [place] from [facility]. She wants VO for her swallowing for 1 visit a week for 2 more weeks. Please advise Dr. Lake Bells thanks

## 2015-07-13 NOTE — Telephone Encounter (Signed)
ATC line rings fast busy signal. WCB

## 2015-07-14 MED ORDER — FLUTICASONE FUROATE-VILANTEROL 100-25 MCG/INH IN AEPB: 1.0000 | INHALATION_SPRAY | Freq: Every day | RESPIRATORY_TRACT | Status: DC

## 2015-07-14 NOTE — Telephone Encounter (Signed)
Verbal order has been given to [facility]. Nothing further was needed.

## 2015-07-14 NOTE — Telephone Encounter (Signed)
OK 

## 2015-07-14 NOTE — Telephone Encounter (Signed)
Spoke with pt. Advised that her PNA is still present but is improving her TP's OV note. Rx for [facility] has been sent in. Nothing further was needed.

## 2015-07-18 ENCOUNTER — Encounter: Payer: Self-pay | Admitting: Family

## 2015-07-25 ENCOUNTER — Telehealth: Payer: Self-pay | Admitting: Family

## 2015-07-25 ENCOUNTER — Ambulatory Visit (HOSPITAL_COMMUNITY)
Admission: RE | Admit: 2015-07-25 | Discharge: 2015-07-25 | Disposition: A | Discharge status: Home / Self Care | Payer: Medicare Other | Source: Ambulatory Visit | Attending: Family | Admitting: Family

## 2015-07-25 ENCOUNTER — Ambulatory Visit (INDEPENDENT_AMBULATORY_CARE_PROVIDER_SITE_OTHER)
Admission: RE | Admit: 2015-07-25 | Discharge: 2015-07-25 | Disposition: A | Discharge status: Home / Self Care | Payer: Medicare Other | Source: Ambulatory Visit | Attending: Vascular Surgery | Admitting: Vascular Surgery

## 2015-07-25 ENCOUNTER — Ambulatory Visit (INDEPENDENT_AMBULATORY_CARE_PROVIDER_SITE_OTHER): Payer: Medicare Other | Admitting: Family

## 2015-07-25 ENCOUNTER — Encounter: Payer: Self-pay | Admitting: Family

## 2015-07-25 VITALS — BP 131/70 | HR 77 | Temp 98.5°F | Resp 16 | Ht 66.0 in | Wt 125.0 lb

## 2015-07-25 DIAGNOSIS — E1151 Type 2 diabetes mellitus with diabetic peripheral angiopathy without gangrene: Secondary | ICD-10-CM

## 2015-07-25 DIAGNOSIS — Z720 Tobacco use: Secondary | ICD-10-CM | POA: Diagnosis not present

## 2015-07-25 DIAGNOSIS — Z95828 Presence of other vascular implants and grafts: Secondary | ICD-10-CM

## 2015-07-25 DIAGNOSIS — I739 Peripheral vascular disease, unspecified: Secondary | ICD-10-CM

## 2015-07-25 DIAGNOSIS — F17200 Nicotine dependence, unspecified, uncomplicated: Secondary | ICD-10-CM

## 2015-07-25 DIAGNOSIS — Z48812 Encounter for surgical aftercare following surgery on the circulatory system: Secondary | ICD-10-CM | POA: Diagnosis not present

## 2015-07-25 DIAGNOSIS — R0989 Other specified symptoms and signs involving the circulatory and respiratory systems: Secondary | ICD-10-CM | POA: Insufficient documentation

## 2015-07-25 DIAGNOSIS — E7800 Pure hypercholesterolemia, unspecified: Secondary | ICD-10-CM | POA: Diagnosis not present

## 2015-07-25 DIAGNOSIS — E119 Type 2 diabetes mellitus without complications: Secondary | ICD-10-CM | POA: Insufficient documentation

## 2015-07-25 DIAGNOSIS — I70219 Atherosclerosis of native arteries of extremities with intermittent claudication, unspecified extremity: Secondary | ICD-10-CM

## 2015-07-25 NOTE — Patient Instructions (Signed)

## 2015-07-25 NOTE — Progress Notes (Signed)
[facility] [place] HISTORY AND PHYSICAL -PAD  History of Present Illness Brenda [name] is a 65 y.o. female patient of Dr.Early who is s/p right femoral to popliteal bypas graft 06/14/97 and then a revision with extension from prior above knee popliteal anastomosis to below knee popliteal artery with reversed small saphenous vein on 02/05/13. She also has a hx of left femoral to popliteal bypass graft with translocated non-reversed saphenous vein on 06/10/1998.  She returns today for follow up. She has right calf pain after walking about 8 minutes, seems worse to her, relieved by rest, denies non healing wounds in legs or feet. She has been walking less since her many URI's. She has numbness in both feet at times, worse in the right foot. She denies claudication pain in left leg. She had a stroke in 2000 as manifested by left hemiparesis, expressive aphasia has resolved, has had several TIA's since then with slurred speech and trouble writing with her right hand, the symptoms resolve quickly, last episode was about 2013.  Dr. Wilhemina Cash, her cardiologist, was monitoring her carotid arteries. 07/06/12 CT soft tissue neck: Atherosclerotic calcifications are present at the carotid bifurcations bilaterally without a significant stenosis.  She had an MI in 2005, states mild. She states she has an appointment with her cardiologist in April 2017.   The patient states she has had pneumonia 5x since March of 2016, and a couple of cases of self treated bronchitis. Pt states it was found that she had aspiration pneumonia and has taken measures to prevent this. She lost weight with these URI's. 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  Pt Diabetic: Yes, states her last A1C was 8.1, increased from previous 7.6, is yet uncontrolled. She started seeing Dr. Buddy Duty, endocrinologist, in February 2017. Pt smoker:  smoker (13-14 cigarettes/day, started at age 20 yrs); she has attended smoking cessation classes 3 times, is taking Wellbutrin now which has not decreased her craving, she gets very depressed when not smoking, states she was on a higher dose previously.    Pt meds include: Statin :Yes Other anticoagulants/antiplatelets: Plavix    Past Medical History  Diagnosis Date  . History of DVT (deep vein thrombosis)   . Coronary artery disease     multiple stens  . COPD (chronic obstructive pulmonary disease) ([place])   . Transient ischemic attack   . Depression   . Hx of radiation therapy 01/16/09 - 03/06/09    base of tongue, right neck node  . Peripheral arterial disease ([name])     status post bilateral femoropopliteal bypass graft performed by Dr. Sherren Mocha early in the past with significant right lower extremity lifestyle limiting claudication  . Pneumonia   . Hypercholesteremia   . Chronic bronchitis ([place])   . Shortness of breath     "w/exertion and sometimes when laying down" (11/26/2012)  . Type II diabetes mellitus ([place])   . History of blood transfusion 1955  . GERD (gastroesophageal reflux disease)   . H/O hiatal hernia   . Stroke [facility]) 2000    left side weakness remains (11/26/2012)  . Arthritis     "hands" (11/26/2012)  . Cancer of base of tongue ([place]) 11/04/2008    "& lymph nodes @ right neck" (11/26/2012  . Myocardial infarction [facility]) 2005    dr Ellyn Hack   . Fatigue 08/23/2013  . Carotid artery occlusion  Social History Social History  Substance Use Topics  . Smoking status: Light Tobacco Smoker -- 1.00 packs/day for 50 years    Types: Cigarettes  . Smokeless tobacco: Never Used     Comment: down to .5ppd per pt 01/30/2015  . Alcohol Use: 3.0 oz/week    5 Glasses of wine per week    Family History Family History  Problem Relation Age of Onset  . Cancer Mother     pancreatic  . Diabetes Mother   . Hyperlipidemia Mother   . Hypertension Mother   . Other Mother      varicose vein  . Cancer Father 72    throat  . Heart disease Father   . Heart attack Father   . Cancer Sister     breast  . Diabetes Sister   . Deep vein thrombosis Brother   . Diabetes Brother   . Hearing loss Brother   . Hyperlipidemia Brother   . Hypertension Brother   . Heart attack Brother   . Clotting disorder Brother   . Diabetes Son   . Hyperlipidemia Son     Past Surgical History  Procedure Laterality Date  . Tee without cardioversion  07/26/2011    Procedure: TRANSESOPHAGEAL ECHOCARDIOGRAM (TEE);  Surgeon: Pixie Casino, MD;  Location: [facility] ENDOSCOPY;  Service: Cardiovascular;  Laterality: N/A;  . Esophagogastroduodenoscopy  12/20/2011    Procedure: ESOPHAGOGASTRODUODENOSCOPY (EGD);  Surgeon: Beryle Beams, MD;  Location: [facility] ENDOSCOPY;  Service: Endoscopy;  Laterality: N/A;  EGD with balloon dilation  . Savory dilation  12/20/2011    Procedure: SAVORY DILATION;  Surgeon: Beryle Beams, MD;  Location: [facility] ENDOSCOPY;  Service: Endoscopy;  Laterality: N/A;  . Tonsillectomy  1969  . Appendectomy  1962  . Cardiac catheterization  1999; 11/26/2012  . Coronary angioplasty with stent placement  1999--2005    "i think I have 3-5 total" (11/26/2012)  . Femoral bypass  1999; 2000    notes 11/26/2012  . Carpal tunnel release Right 1980's?  . Vaginal hysterectomy  1988  . Cesarean section  1978  . Tubal ligation  1988  . Cataract extraction w/ intraocular lens implant Right 2011  . Ankle fracture surgery Right 2007    "got a metal plate and 8 screws in it" (11/26/2012)  . Bypass graft popliteal to popliteal Right 02/05/2013    Procedure: BYPASS GRAFT ABOVE KNEE POPLITEAL TO BELOW KNEE POPLITEAL WITH SMALL SAPHANOUS VEIN;  Surgeon: Rosetta Posner, MD;  Location: [facility] OR;  Service: Vascular;  Laterality: Right;  . Eye surgery      Allergies  Allergen Reactions  . Tape Hives and Other (See Comments)    USE PAPER TAPE ONLY- Adhesive peels off skin-makes pt. Raw.  Maryjo Rochester [Nicotine]  Rash    To the PATCH only, breakouts on skin    Current Outpatient Prescriptions  Medication Sig Dispense Refill  . acetaminophen (TYLENOL) 500 MG tablet Take 1,000 mg by mouth every 6 (six) hours as needed for pain.    Marland Kitchen albuterol (PROVENTIL HFA;VENTOLIN HFA) 108 (90 BASE) MCG/ACT inhaler Inhale 2 puffs into the lungs every 6 (six) hours as needed for wheezing or shortness of breath. 1 Inhaler 0  . aspirin EC 81 MG tablet Take 81 mg by mouth daily.    Marland Kitchen buPROPion (WELLBUTRIN XL) 150 MG 24 hr tablet Take 1 tablet (150 mg total) by mouth daily. 30 tablet 0  . clopidogrel (PLAVIX) 75 MG tablet Take 75 mg by mouth  daily.     . docusate sodium (COLACE) 100 MG capsule Take 100 mg by mouth daily.    . fish oil-omega-3 fatty acids 1000 MG capsule Take 1 g by mouth 2 (two) times daily.     . fluticasone (FLONASE) 50 MCG/ACT nasal spray Place 2 sprays into both nostrils daily. 16 g 2  . Fluticasone Furoate-Vilanterol (BREO ELLIPTA) 100-25 MCG/INH AEPB Inhale 1 puff into the lungs daily. 60 each 5  . guaiFENesin (MUCINEX) 600 MG 12 hr tablet Take 2 tablets (1,200 mg total) by mouth 2 (two) times daily. 60 tablet 3  . insulin lispro (HUMALOG) 100 UNIT/ML injection Inject 3 Units into the skin 3 (three) times daily with meals as needed for high blood sugar.     . ipratropium-albuterol (DUONEB) 0.5-2.5 (3) MG/3ML SOLN Take 3 mLs by nebulization every 6 (six) hours. 360 mL 3  . loratadine (CLARITIN) 10 MG tablet Take 1 tablet (10 mg total) by mouth daily. 30 tablet 0  . Multiple Vitamins-Minerals (MULTIVITAMIN & MINERAL PO) Take 1 tablet by mouth daily.    . nitroGLYCERIN (NITROLINGUAL) 0.4 MG/SPRAY spray Place 1 spray under the tongue every 5 (five) minutes as needed for chest pain. 12 g 0  . omeprazole (PRILOSEC) 20 MG capsule Take 20 mg by mouth daily.     . rosuvastatin (CRESTOR) 20 MG tablet Take 20 mg by mouth daily.    . Tiotropium Bromide Monohydrate (SPIRIVA RESPIMAT) 2.5 MCG/ACT AERS Inhale 2  puffs into the lungs daily. 1 Inhaler 11   No current facility-administered medications for this visit.    ROS: See HPI for pertinent positives and negatives.   Physical Examination  Filed Vitals:   07/25/15 0944  BP: 131/70  Pulse: 77  Temp: 98.5 F (36.9 C)  TempSrc: Oral  Resp: 16  Height: '5\' 6"'$  (1.676 m)  Weight: 125 lb (56.7 kg)  SpO2: 97%   Body mass index is 20.19 kg/(m^2).  General: A&O x 3, WDWN, thin female. Gait: normal Eyes: PERRLA. Pulmonary:  limited air movement in all fields  Cardiac: regular rhythm, no detected murmur.     Carotid Bruits Left Right   Negative positive  Aorta is not palpable. Radial pulses: 2+ palpable right, 1+ palpable left   VASCULAR EXAM: Extremities without ischemic changes  without Gangrene; without open wounds.     LE Pulses LEFT RIGHT   FEMORAL 2+ palpable 2+ palpable    POPLITEAL not palpable  not palpable   POSTERIOR TIBIAL not palpable  not palpable    DORSALIS PEDIS  ANTERIOR TIBIAL 1+ palpable  not palpable    Abdomen: soft, NT, no palpable masses. Skin: no rashes, no ulcers. Musculoskeletal: no muscle wasting or atrophy. Moderate kyphosis. Neurologic: A&O X 3; Appropriate Affect ; SENSATION: normal; MOTOR FUNCTION: moving all extremities equally, motor strength 5/5 throughout. Speech is fluent/normal. CN 2-12 intact.          Non-Invasive Vascular Imaging: DATE: 07/25/2015 LOWER EXTREMITY ARTERIAL DUPLEX EVALUATION    INDICATION: Peripheral vascular disease; right leg claudication    PREVIOUS INTERVENTION(S): Right femoropopliteal bypass graft 1999 with revision/extension of distal anastomosis 02/05/2013. Left femoropopliteal graft 2000.    DUPLEX EXAM:      RIGHT  LEFT   Peak Systolic Velocity (cm/s) Ratio (if abnormal) Waveform  Peak Systolic Velocity (cm/s) Ratio (if abnormal) Waveform  63  B  Inflow Artery 130  T  80  M Proximal Anastomosis 165  B  23  M Proximal Graft  76  B  22  M Mid Graft 50  B  19  M  Distal Graft 41  B  14  M Distal Anastomosis 53  B  21  M Outflow Artery 199  T   0.48 Today's ABI / TBI 1.11  0.64 Previous ABI / TBI (11/22/2014  ) 1.1    Waveform:    M - Monophasic       B - Biphasic       T - Triphasic  If Ankle Brachial Index (ABI) or Toe Brachial Index (TBI) performed, please see complete report     ADDITIONAL FINDINGS: Impression noted on page two of this report.     IMPRESSION: Patent bilateral lower extremity bypass grafts without evidence of hemodynamically significant stenosis within the grafts.  Low flow noted in the right bypass with diminished flow noted in the native inflow artery; more proximal stenosis may be probable.     Compared to the previous exam:  Decline in the right ankle-brachial index; unable to replicate the distal anastomosis velocity taken on the previous exam on 11/22/2014.     ASSESSMENT: Brenda [name] is a 65 y.o. female who is s/p right femoral to popliteal bypas graft 06/14/97 and then a revision with extension from prior above knee popliteal anastomosis to below knee popliteal artery with reversed small saphenous vein on 02/05/13. She also has a hx of left femoral to popliteal bypass graft with translocated non-reversed saphenous vein on 06/10/1998.  She has right calf pain after walking about 8 minutes, seems worse to her, relieved by rest, no signs of ischemia in her legs or feet. She has tingling and numbness in both feet at times, worse in the right foot. She had a stroke in 2000 as manifested by left hemiparesis, expressive aphasia has resolved, has had several TIA's since then with slurred speech and trouble writing with her right hand, the symptoms resolve quickly, last  episode was about 2013.    06/18/14 carotid Duplex suggested bilateral internal carotid artery stenosis less than 40%, essentially unchanged when compared to previous outside study performed at [facility] 07/25/2011.  Today's bilateral LE arterial duplex suggests patent bilateral lower extremity bypass grafts without evidence of hemodynamically significant stenosis within the grafts.  Low flow noted in the right bypass with diminished flow noted in the native inflow artery; more proximal stenosis may be probable. Decline in the right ankle-brachial index, left ABI remains normal with triphasic waveforms.   She has not been walking much, weakened by her multiple bouts of aspiration pneumonia and bronchitis in the last year. She is thickening her liquids with a starchy agent which she and her endocrinologist have attributed to an increase in her A1C even though she has lost weight.  Her atherosclerotic risk factors include uncontrolled DM and smoking.  Face to face time with patient was 25 minutes. Over 50% of this time was spent on counseling and coordination of care.    PLAN:  The patient was counseled re smoking cessation and given several free resources re smoking cessation.   Based on the patient's vascular studies and examination, pt will return to clinic in 6 months for carotid duplex, ABI's and bilateral LE arterial duplex.   I discussed in depth with the patient the nature of atherosclerosis, and emphasized the importance of maximal medical management including strict control of blood pressure, blood glucose, and lipid levels, obtaining regular exercise, and cessation of smoking.  The patient is aware that without maximal  medical management the underlying atherosclerotic disease process will progress, limiting the benefit of any interventions.  The patient was given information about PAD including signs, symptoms, treatment, what symptoms should prompt the patient to seek immediate medical  care, and risk reduction measures to take.  Clemon Chambers, RN, MSN, FNP-C [facility] [employer] [facility] Phone: [phone]  Clinic MD: Early  07/25/2015 9:58 AM

## 2015-07-25 NOTE — Telephone Encounter (Addendum)
I added the carotid u/s to the appt in August. I lm on pt's hm# to inform her of the change.  ----- Message from Gena Fray sent at 07/25/2015  3:51 PM EST ----- Regarding: FW: reschedule carotid duplex Selinda Eon-  Would you please take care of this request from [place] below?  Thank you! Hinton Dyer ----- Message -----    From: Viann Fish, NP    Sent: 07/25/2015  11:58 AM      To: Sharmon Leyden Nickel, NP, [facility] Pool Subject: reschedule carotid duplex                      Please cancel the carotid duplex for 2 weeks and reschedule it along with her other studies in 6 months. When you speak with the pt please let her know the reason for this is that I checked after she left and we did perform a carotid duplex in January 2016 and it showed almost no plaque in her carotid arteries.  Thank you, Vinnie Level

## 2015-07-26 ENCOUNTER — Ambulatory Visit (HOSPITAL_COMMUNITY): Payer: Medicare Other

## 2015-07-31 ENCOUNTER — Encounter (HOSPITAL_COMMUNITY): Payer: Self-pay

## 2015-08-08 ENCOUNTER — Ambulatory Visit: Payer: Self-pay | Admitting: Family

## 2015-08-16 ENCOUNTER — Encounter: Payer: Self-pay | Admitting: Pulmonary Disease

## 2015-08-16 ENCOUNTER — Ambulatory Visit (INDEPENDENT_AMBULATORY_CARE_PROVIDER_SITE_OTHER)
Admission: RE | Admit: 2015-08-16 | Discharge: 2015-08-16 | Disposition: A | Discharge status: Home / Self Care | Payer: Medicare Other | Source: Ambulatory Visit | Attending: Pulmonary Disease | Admitting: Pulmonary Disease

## 2015-08-16 ENCOUNTER — Ambulatory Visit (INDEPENDENT_AMBULATORY_CARE_PROVIDER_SITE_OTHER): Payer: Medicare Other | Admitting: Pulmonary Disease

## 2015-08-16 VITALS — BP 122/66 | HR 87 | Temp 98.2°F | Ht 66.0 in | Wt 123.0 lb

## 2015-08-16 DIAGNOSIS — R05 Cough: Secondary | ICD-10-CM

## 2015-08-16 DIAGNOSIS — R0602 Shortness of breath: Secondary | ICD-10-CM

## 2015-08-16 DIAGNOSIS — J432 Centrilobular emphysema: Secondary | ICD-10-CM | POA: Diagnosis not present

## 2015-08-16 DIAGNOSIS — R059 Cough, unspecified: Secondary | ICD-10-CM

## 2015-08-16 DIAGNOSIS — Z720 Tobacco use: Secondary | ICD-10-CM

## 2015-08-16 DIAGNOSIS — J189 Pneumonia, unspecified organism: Secondary | ICD-10-CM

## 2015-08-16 MED ORDER — PREDNISONE 10 MG PO TABS: ORAL_TABLET | ORAL | Status: DC

## 2015-08-16 NOTE — Progress Notes (Signed)
Subjective:    Patient ID: Brenda [name], female    DOB: Aug 14, 1950, 65 y.o.   MRN: [id number]  Synopsis: Severe COPD, still actively smoking as of July 2016 Simple spirometry FEV1 June 2016 0.67 L (27% predicted) with clear airflow obstruction  HPI Chief Complaint  Patient presents with  . Follow-up    pt c/o weakness, fatigue, prod cough with green mucus X5 days.      Diem says that her PCP diagnosed her with influenza two weeks ago.  She has still been suffering with fatigue, coughing, mucus production, but surprisingly not dyspnea.  She says that the symptoms are worsening again in the last few days.  She says that she just hasn't bounced back yet.  She thinks that she felt better after her hospitalization initially and she really hsan't suffered from dyspnea much lately.  She just has a persistent lack of energy and persistent cough.  She is still coughing up green to grey mucus.     Past Medical History  Diagnosis Date  . History of DVT (deep vein thrombosis)   . Coronary artery disease     multiple stens  . COPD (chronic obstructive pulmonary disease) ([place])   . Transient ischemic attack   . Depression   . Hx of radiation therapy 01/16/09 - 03/06/09    base of tongue, right neck node  . Peripheral arterial disease ([name])     status post bilateral femoropopliteal bypass graft performed by Dr. Sherren Mocha early in the past with significant right lower extremity lifestyle limiting claudication  . Pneumonia   . Hypercholesteremia   . Chronic bronchitis ([place])   . Shortness of breath     "w/exertion and sometimes when laying down" (11/26/2012)  . Type II diabetes mellitus ([place])   . History of blood transfusion 1955  . GERD (gastroesophageal reflux disease)   . H/O hiatal hernia   . Stroke [facility]) 2000    left side weakness remains (11/26/2012)  . Arthritis     "hands" (11/26/2012)  . Cancer of base of tongue ([place]) 11/04/2008    "& lymph nodes @ right neck" (11/26/2012  . Myocardial  infarction [facility]) 2005    dr Ellyn Hack   . Fatigue 08/23/2013  . Carotid artery occlusion       Review of Systems  Constitutional: Positive for fatigue. Negative for fever and chills.  HENT: Negative for postnasal drip, rhinorrhea and sinus pressure.   Respiratory: Positive for cough. Negative for wheezing.   Cardiovascular: Negative for chest pain, palpitations and leg swelling.       Objective:   Physical Exam Filed Vitals:   08/16/15 1138  BP: 122/66  Pulse: 87  Temp: 98.2 F (36.8 C)  TempSrc: Oral  Height: '5\' 6"'$  (1.676 m)  Weight: 123 lb (55.792 kg)  SpO2: 94%   RA  Gen: chronically ill appearing, kyphosis HENT: OP clear, TM's clear, neck supple PULM: Wheezing bilaterally, poor movement CV: RRR, no mgr, trace edema GI: BS+, soft, nontender Derm: no cyanosis or rash Psyche: normal mood and affect  CBC    Component Value Date/Time   WBC 7.8 05/29/2015 0602   WBC 5.1 08/21/2012 1256   RBC 4.04 05/29/2015 0602   RBC 4.85 08/21/2012 1256   HGB 11.9* 05/29/2015 0602   HGB 15.6 08/21/2012 1256   HCT 37.6 05/29/2015 0602   HCT 44.6 08/21/2012 1256   PLT 218 05/29/2015 0602   PLT 192 08/21/2012 1256   MCV 93.1 05/29/2015 0602  MCV 92.0 08/21/2012 1256   MCH 29.5 05/29/2015 0602   MCH 32.2 08/21/2012 1256   MCHC 31.6 05/29/2015 0602   MCHC 34.9 08/21/2012 1256   RDW 14.6 05/29/2015 0602   RDW 13.8 08/21/2012 1256   LYMPHSABS 0.2* 05/29/2015 0602   LYMPHSABS 0.6* 08/21/2012 1256   MONOABS 0.1 05/29/2015 0602   MONOABS 0.3 08/21/2012 1256   EOSABS 0.0 05/29/2015 0602   EOSABS 0.0 08/21/2012 1256   BASOSABS 0.0 05/29/2015 0602   BASOSABS 0.1 08/21/2012 1256   February 2017 chest x-ray images personally reviewed showing persistent infiltrate but slightly improved compared to prior Records from a nurse practitioner reviewed where she was treated for recurrent pneumonia again and advised to follow the dysphagia recommendations from speech therapy      Assessment & Plan:  COPD (chronic obstructive pulmonary disease) Unfortunately she is having another exacerbation of her severe COPD. This is the natural history of COPD with ongoing tobacco use so it's not surprising that she keeps having exacerbations. I advised her at length today to quit smoking.  Plan: Prednisone taper Stop smoking Continuing inhaled medications.  Tobacco abuse Counseled at length to quit smoking today.  Recurrent pneumonia She has experienced recurrent pneumonia in the last 6 months which has been assumed to be due to her known severe dysphagia. I still think that's the likely cause but I think it's worthwhile to test her mucus for atypical organisms.  Plan: Continue dysphasia treatment Sputum culture for bacteria, fungal, AFB Chest x-ray today to ensure that the abnormal findings from the February 2017 chest x-ray is improving.     Current outpatient prescriptions:  .  acetaminophen (TYLENOL) 500 MG tablet, Take 1,000 mg by mouth every 6 (six) hours as needed for pain., Disp: , Rfl:  .  albuterol (PROVENTIL HFA;VENTOLIN HFA) 108 (90 BASE) MCG/ACT inhaler, Inhale 2 puffs into the lungs every 6 (six) hours as needed for wheezing or shortness of breath., Disp: 1 Inhaler, Rfl: 0 .  aspirin EC 81 MG tablet, Take 81 mg by mouth daily., Disp: , Rfl:  .  buPROPion (WELLBUTRIN XL) 150 MG 24 hr tablet, Take 1 tablet (150 mg total) by mouth daily., Disp: 30 tablet, Rfl: 0 .  clopidogrel (PLAVIX) 75 MG tablet, Take 75 mg by mouth daily. , Disp: , Rfl:  .  docusate sodium (COLACE) 100 MG capsule, Take 100 mg by mouth daily., Disp: , Rfl:  .  fish oil-omega-3 fatty acids 1000 MG capsule, Take 1 g by mouth 2 (two) times daily. , Disp: , Rfl:  .  fluticasone (FLONASE) 50 MCG/ACT nasal spray, Place 2 sprays into both nostrils daily., Disp: 16 g, Rfl: 2 .  Fluticasone Furoate-Vilanterol (BREO ELLIPTA) 100-25 MCG/INH AEPB, Inhale 1 puff into the lungs daily., Disp: 60 each,  Rfl: 5 .  guaiFENesin (MUCINEX) 600 MG 12 hr tablet, Take 2 tablets (1,200 mg total) by mouth 2 (two) times daily., Disp: 60 tablet, Rfl: 3 .  insulin lispro (HUMALOG) 100 UNIT/ML injection, Inject 3 Units into the skin 3 (three) times daily with meals as needed for high blood sugar. , Disp: , Rfl:  .  ipratropium-albuterol (DUONEB) 0.5-2.5 (3) MG/3ML SOLN, Take 3 mLs by nebulization every 6 (six) hours., Disp: 360 mL, Rfl: 3 .  loratadine (CLARITIN) 10 MG tablet, Take 1 tablet (10 mg total) by mouth daily., Disp: 30 tablet, Rfl: 0 .  Multiple Vitamins-Minerals (MULTIVITAMIN & MINERAL PO), Take 1 tablet by mouth daily., Disp: , Rfl:  .  nitroGLYCERIN (NITROLINGUAL) 0.4 MG/SPRAY spray, Place 1 spray under the tongue every 5 (five) minutes as needed for chest pain., Disp: 12 g, Rfl: 0 .  omeprazole (PRILOSEC) 20 MG capsule, Take 20 mg by mouth daily. , Disp: , Rfl:  .  rosuvastatin (CRESTOR) 20 MG tablet, Take 20 mg by mouth daily., Disp: , Rfl:  .  Tiotropium Bromide Monohydrate (SPIRIVA RESPIMAT) 2.5 MCG/ACT AERS, Inhale 2 puffs into the lungs daily., Disp: 1 Inhaler, Rfl: 11 .  predniSONE (DELTASONE) 10 MG tablet, Take '40mg'$  po daily for 3 days, then take '30mg'$  po daily for 3 days, then take '20mg'$  po daily for two days, then take '10mg'$  po daily for 2 days, Disp: 27 tablet, Rfl: 0q

## 2015-08-16 NOTE — Assessment & Plan Note (Signed)
Counseled at length to quit smoking today. 

## 2015-08-16 NOTE — Assessment & Plan Note (Signed)
She has experienced recurrent pneumonia in the last 6 months which has been assumed to be due to her known severe dysphagia. I still think that's the likely cause but I think it's worthwhile to test her mucus for atypical organisms.  Plan: Continue dysphasia treatment Sputum culture for bacteria, fungal, AFB Chest x-ray today to ensure that the abnormal findings from the February 2017 chest x-ray is improving.

## 2015-08-16 NOTE — Patient Instructions (Signed)
We will call you with the results of the chest x-ray Take the prednisone taper as prescribed Provide [place] with a sample of your mucus, based on the culture results we will call in an antibiotic to treat you I want you to follow-up with Tammy in 4 weeks Quit smoking Keep using your inhalers as you're doing

## 2015-08-16 NOTE — Assessment & Plan Note (Signed)
Unfortunately she is having another exacerbation of her severe COPD. This is the natural history of COPD with ongoing tobacco use so it's not surprising that she keeps having exacerbations. I advised her at length today to quit smoking.  Plan: Prednisone taper Stop smoking Continuing inhaled medications.

## 2015-08-17 ENCOUNTER — Other Ambulatory Visit: Payer: Self-pay

## 2015-08-17 DIAGNOSIS — R042 Hemoptysis: Secondary | ICD-10-CM

## 2015-08-17 DIAGNOSIS — R05 Cough: Secondary | ICD-10-CM

## 2015-08-17 DIAGNOSIS — R0602 Shortness of breath: Secondary | ICD-10-CM

## 2015-08-17 DIAGNOSIS — R059 Cough, unspecified: Secondary | ICD-10-CM

## 2015-08-18 ENCOUNTER — Other Ambulatory Visit: Payer: Self-pay

## 2015-08-18 MED ORDER — DOXYCYCLINE HYCLATE 100 MG PO TABS: 100.0000 mg | ORAL_TABLET | Freq: Two times a day (BID) | ORAL | Status: DC

## 2015-08-21 LAB — RESPIRATORY CULTURE OR RESPIRATORY AND SPUTUM CULTURE

## 2015-08-22 ENCOUNTER — Other Ambulatory Visit: Payer: Self-pay | Admitting: Acute Care

## 2015-08-22 DIAGNOSIS — F17210 Nicotine dependence, cigarettes, uncomplicated: Principal | ICD-10-CM

## 2015-08-30 LAB — AFB CULTURE WITH SMEAR (NOT AT ARMC)

## 2015-08-30 LAB — FUNGUS CULTURE W SMEAR

## 2015-09-05 ENCOUNTER — Encounter: Payer: Self-pay | Admitting: Cardiology

## 2015-09-05 ENCOUNTER — Ambulatory Visit (INDEPENDENT_AMBULATORY_CARE_PROVIDER_SITE_OTHER): Payer: Medicare Other | Admitting: Cardiology

## 2015-09-05 VITALS — BP 132/62 | HR 81 | Ht 66.0 in | Wt 122.6 lb

## 2015-09-05 DIAGNOSIS — Z794 Long term (current) use of insulin: Secondary | ICD-10-CM

## 2015-09-05 DIAGNOSIS — I739 Peripheral vascular disease, unspecified: Secondary | ICD-10-CM

## 2015-09-05 DIAGNOSIS — Z720 Tobacco use: Secondary | ICD-10-CM | POA: Diagnosis not present

## 2015-09-05 DIAGNOSIS — I25118 Atherosclerotic heart disease of native coronary artery with other forms of angina pectoris: Secondary | ICD-10-CM

## 2015-09-05 DIAGNOSIS — R079 Chest pain, unspecified: Secondary | ICD-10-CM

## 2015-09-05 DIAGNOSIS — E1121 Type 2 diabetes mellitus with diabetic nephropathy: Secondary | ICD-10-CM

## 2015-09-05 DIAGNOSIS — E1165 Type 2 diabetes mellitus with hyperglycemia: Secondary | ICD-10-CM

## 2015-09-05 DIAGNOSIS — IMO0002 Reserved for concepts with insufficient information to code with codable children: Secondary | ICD-10-CM

## 2015-09-05 MED ORDER — ISOSORBIDE MONONITRATE ER 30 MG PO TB24: 15.0000 mg | ORAL_TABLET | Freq: Every day | ORAL | Status: DC

## 2015-09-05 NOTE — Patient Instructions (Addendum)
Start taking Isosorbide mono 15 mg (1/2 tablet of 30 mg tablet) , take 30 minutes after you take daily 81 mg aspirin.  Your physician has requested that you have a lexiscan myoview at [facility]off[facility]ice . For further information please visit [email or web address]. Please follow instruction sheet, as given.  Your physician recommends that you schedule a follow-up appointment in 2 months with Dr Ellyn Hack -f/u test results  If you need a refill on your cardiac medications before your next appointment, please call your pharmacy.

## 2015-09-05 NOTE — Progress Notes (Signed)
PCP: [name], MD  Clinic Note: Chief Complaint  Patient presents with  . Annual Exam    Yes Chest Pain Yes Swelling No SOB Yes dizziness    HPI: Brenda [name] is a 65 y.o. female with a PMH Of both CAD and PAD/PVDOD below who presents today for Reestablishment of cardiology care. I last saw her apparently in 2014 (I cannot find my last note). She was last seen in our practice by Dr. Quay Burow in July 2014 to evaluate concern of right lower extremity claudication.   She has a long-standing history of coronary artery disease dating back to 1999.  She has stents in the LAD, circumflex and RCA with last heart catheterization in 2007 revealing patent stents and normal LV function. Reportedly had an MI in 2005. She also has PVD OD status post bilateral femoropopliteal bypass grafting in 1999 and 2000 by Dr. Sherren Mocha Early -- with revision of the right femoropopliteal graft from prior prior AK-Pop to BK-Pop with saphenous vein graft on 02/05/13.  -She has followed up with Dr. Marrion Coy office for lower extremity arterial Dopplers as well as carotid Dopplers. She has a history of prior stroke in 2000 with left-sided weakness and is a persistent smoker with long-standing severe COPD (spirometry in June 2016 showed FEV1 of 0.67 = 27% predicted)  -- apparently she is attended smoking cessation classes 3 times, and has tried Wellbutrin. She has now been seeing an endocrinologist for her diabetes.. She has severe kyphoscoliosis addition to hypertension and type 2 diabetes mellitus.  Brenda [name] was last seen by me prior to July 2014 -- I never saw her after Dr. Gwenlyn Found performed diagnostic most 20 angiography which led to her having revision of her right sided femoropopliteal graft.     Recent Hospitalizations: December 25-30, 2016 --> admitted for acute on chronic respiratory failure with hypoxia noted to have bilateral pneumonia from COPD. She was septic and hypotensive. Upon discharge they were  thinking about arranging home hospice. She did not want to go to skilled nursing facility. (Reports that she has had pneumonia 5 times since March 2016 as well as several other ACEs of self medicated bouts of bronchitis. It is believed that she may be dysphagia related to recurrent aspiration leading to recurrent pneumonia). - Pulmonary note indicates she is just a persistent lack of energy and persistent cough since her discharge.  Studies Reviewed:  * LEA DOPPLERS (Dr. Donnetta Hutching) February 2017: Decline in RABI - there is patent bilateral low story bypass grafts without stenosis within grass but low flow in the right graft and diminished flow noted in native inflow artery. Our ABI 0.48, L ABI 1.11 * Myoview 09/2011: EF 61%. No ischemia or infract * Echo 08/2014: EF 55-60%. Normal wall motion. (In setting of sepsis)  Interval History: [name] presents today at the behest of her pulmonologist (Dr. Lake Bells). She complains of exertional chest discomfort/burning. She is very limited at all by her activity anyway since all 4 hospitalizations in with pneumonia. She has noted significant GERD, but does note having chest discomfort with minimal amount of exertion walking around the house. At least 2-3 times a week she notes a more significant episode of chest discomfort. Certainly walking any kind of incline or stairs brings it on. Of course she is always short of breath exertion which has been the case for a long time now. She has baseline shortness of breath that is exacerbated with exertion. She denies any PND, orthopnea or significant  edema - she sleeps on pillows, not as much for orthopnea as for her GERD. No palpitations, lightheadedness, dizziness, weakness or syncope/near syncope. No TIA/amaurosis fugax symptoms. No melena, hematochezia, hematuria, or epstaxis. No claudication.  ROS: A comprehensive was performed. Review of Systems  Constitutional: Positive for malaise/fatigue. Negative for weight loss  (But not eating well).  HENT: Negative for nosebleeds.   Eyes: Negative.   Respiratory: Positive for cough (Chronic, persistent), sputum production (Less productive than before. Still with gray mucus), shortness of breath and wheezing.   Cardiovascular: Positive for claudication (Right calf).  Gastrointestinal: Positive for heartburn (Significant GERD. - This is the reason why she sleeps with several pillows per pulmonology recommendation.). Negative for blood in stool and melena.       Presumed dysphagia leading to recurrent aspiration.  Genitourinary: Negative for hematuria.  Musculoskeletal: Positive for back pain (Significant thoracic kyphoscoliosis).  Neurological: Positive for dizziness (With quick movements). Negative for sensory change, speech change, focal weakness, seizures, loss of consciousness and headaches.  Endo/Heme/Allergies: Bruises/bleeds easily.  Psychiatric/Behavioral: Positive for depression (Too many things going on). Negative for memory loss. The patient is nervous/anxious. The patient does not have insomnia.   All other systems reviewed and are negative.  Past Medical History  Diagnosis Date  . History of DVT (deep vein thrombosis)   . Coronary artery disease     multiple stens  . COPD (chronic obstructive pulmonary disease) ([place])   . Transient ischemic attack   . Depression   . Hx of radiation therapy 01/16/09 - 03/06/09    base of tongue, right neck node  . Peripheral arterial disease ([name])     status post bilateral femoropopliteal bypass graft performed by Dr. Sherren Mocha early in the past with significant right lower extremity lifestyle limiting claudication  . Pneumonia   . Hypercholesteremia   . Chronic bronchitis ([place])   . Shortness of breath     "w/exertion and sometimes when laying down" (11/26/2012)  . Type II diabetes mellitus ([place])   . History of blood transfusion 1955  . GERD (gastroesophageal reflux disease)   . H/O hiatal hernia   . Stroke [facility]) 2000      left side weakness remains (11/26/2012)  . Arthritis     "hands" (11/26/2012)  . Cancer of base of tongue ([place]) 11/04/2008    "& lymph nodes @ right neck" (11/26/2012  . Myocardial infarction [facility]) 2005    dr Ellyn Hack   . Fatigue 08/23/2013  . Carotid artery occlusion     Past Surgical History  Procedure Laterality Date  . Tee without cardioversion  07/26/2011    Procedure: TRANSESOPHAGEAL ECHOCARDIOGRAM (TEE);  Surgeon: Pixie Casino, MD;  Location: [facility] ENDOSCOPY;  Service: Cardiovascular;  Laterality: N/A;  . Esophagogastroduodenoscopy  12/20/2011    Procedure: ESOPHAGOGASTRODUODENOSCOPY (EGD);  Surgeon: Beryle Beams, MD;  Location: [facility] ENDOSCOPY;  Service: Endoscopy;  Laterality: N/A;  EGD with balloon dilation  . Savory dilation  12/20/2011    Procedure: SAVORY DILATION;  Surgeon: Beryle Beams, MD;  Location: [facility] ENDOSCOPY;  Service: Endoscopy;  Laterality: N/A;  . Tonsillectomy  1969  . Appendectomy  1962  . Cardiac catheterization  1999; 11/26/2012  . Coronary angioplasty with stent placement  1999--2005    "i think I have 3-5 total" (11/26/2012)  . Femoral bypass  1999; 2000    notes 11/26/2012  . Carpal tunnel release Right 1980's?  . Vaginal hysterectomy  1988  . Cesarean section  1978  . Tubal  ligation  1988  . Cataract extraction w/ intraocular lens implant Right 2011  . Ankle fracture surgery Right 2007    "got a metal plate and 8 screws in it" (11/26/2012)  . Bypass graft popliteal to popliteal Right 02/05/2013    Procedure: BYPASS GRAFT ABOVE KNEE POPLITEAL TO BELOW KNEE POPLITEAL WITH SMALL SAPHANOUS VEIN;  Surgeon: Rosetta Posner, MD;  Location: [place];  Service: Vascular;  Laterality: Right;  . Eye surgery     Cardiac Cath/PCI Results Prior BMS to mid circumflex in 1999 - redo stenting for progression of disease in 2001 Elite NIR BMS 3.5 mm x 12 mm May 2003:   10/20/2001: PCI of p-mRCA with Express II  BMS 3.0 mm  x 24 mm  and 3.5 mm  x 16 mm stents.  10/28/2001:  Staged: PTCA/PCI of proximal-mid LAD with PTCA of D2 - Cypher DES 3.0 mm x 13 mm, diagonal PTCA with 2.5 mm balloon  Cath results from May 2007:  Mid LAD 20-30%. Patent mid LAD stent. The diagonals without significant disease\  Circumflex with mild tender 20% proximal stenosis. 2 stents in circumflex widely patent. Large OM is normal.  RCA: 2 long stents in the proximal segment with 20% ISR in the junction, 40% stenosis distal to stents. Distal right is normal leading to PDA with several posterolateral branches.  LV gram EF of 70%.  Abdominal aortogram shows right renal artery with moderate stenosis of (40- 50%). Left renal artery normal.  Allergies  Allergen Reactions  . Tape Hives and Other (See Comments)    USE PAPER TAPE ONLY- Adhesive peels off skin-makes pt. Raw.  Maryjo Rochester [Nicotine] Rash    To the PATCH only, breakouts on skin   Prior to Admission medications   Medication Sig Start Date End Date Taking? Authorizing Provider  acetaminophen (TYLENOL) 500 MG tablet Take 1,000 mg by mouth every 6 (six) hours as needed for pain.    Historical Provider, MD  albuterol (PROVENTIL HFA;VENTOLIN HFA) 108 (90 BASE) MCG/ACT inhaler Inhale 2 puffs into the lungs every 6 (six) hours as needed for wheezing or shortness of breath. 01/20/15   Belkys A Regalado, MD  aspirin EC 81 MG tablet Take 81 mg by mouth daily.    Historical Provider, MD  buPROPion (WELLBUTRIN XL) 150 MG 24 hr tablet Take 1 tablet (150 mg total) by mouth daily. 08/18/14   Allie Bossier, MD  clopidogrel (PLAVIX) 75 MG tablet Take 75 mg by mouth daily.     Historical Provider, MD  docusate sodium (COLACE) 100 MG capsule Take 100 mg by mouth daily.    Historical Provider, MD  fish oil-omega-3 fatty acids 1000 MG capsule Take 1 g by mouth 2 (two) times daily.     Historical Provider, MD  fluticasone (FLONASE) 50 MCG/ACT nasal spray Place 2 sprays into both nostrils daily. 06/02/15   Debbe Odea, MD  Fluticasone  Furoate-Vilanterol (BREO ELLIPTA) 100-25 MCG/INH AEPB Inhale 1 puff into the lungs daily. 07/14/15   Tammy S Parrett, NP  guaiFENesin (MUCINEX) 600 MG 12 hr tablet Take 2 tablets (1,200 mg total) by mouth 2 (two) times daily. 06/02/15   Debbe Odea, MD  insulin lispro (HUMALOG) 100 UNIT/ML injection Inject 3 Units into the skin 3 (three) times daily with meals as needed for high blood sugar.     Historical Provider, MD  ipratropium-albuterol (DUONEB) 0.5-2.5 (3) MG/3ML SOLN Take 3 mLs by nebulization every 6 (six) hours. 06/02/15   Debbe Odea,  MD  loratadine (CLARITIN) 10 MG tablet Take 1 tablet (10 mg total) by mouth daily. 06/02/15   Debbe Odea, MD  Multiple Vitamins-Minerals (MULTIVITAMIN & MINERAL PO) Take 1 tablet by mouth daily.    Historical Provider, MD  nitroGLYCERIN (NITROLINGUAL) 0.4 MG/SPRAY spray Place 1 spray under the tongue every 5 (five) minutes as needed for chest pain. 01/20/15   Belkys A Regalado, MD  omeprazole (PRILOSEC) 20 MG capsule Take 20 mg by mouth daily.     Historical Provider, MD  rosuvastatin (CRESTOR) 20 MG tablet Take 20 mg by mouth daily.    Historical Provider, MD  Tiotropium Bromide Monohydrate (SPIRIVA RESPIMAT) 2.5 MCG/ACT AERS Inhale 2 puffs into the lungs daily. 12/19/14   Juanito Doom, MD     Social History   Social History  . Marital Status: Married    Spouse Name: N/A  . Number of Children: N/A  . Years of Education: N/A   Occupational History  . house wife    Social History Main Topics  . Smoking status: Light Tobacco Smoker -- 1.00 packs/day for 50 years    Types: Cigarettes  . Smokeless tobacco: Never Used     Comment: down to .5ppd per pt 08/16/15  . Alcohol Use: 3.0 oz/week    5 Glasses of wine per week  . Drug Use: No  . Sexual Activity: Yes   Other Topics Concern  . None   Social History Narrative   Family History family history includes Cancer in her mother and sister; Cancer (age of onset: 79) in her father; Clotting  disorder in her brother; Deep vein thrombosis in her brother; Diabetes in her brother, mother, sister, and son; Hearing loss in her brother; Heart attack in her brother and father; Heart disease in her father; Hyperlipidemia in her brother, mother, and son; Hypertension in her brother and mother; Other in her mother.  Wt Readings from Last 3 Encounters:  09/05/15 122 lb 9.6 oz (55.611 kg)  08/16/15 123 lb (55.792 kg)  07/25/15 125 lb (56.7 kg)   PHYSICAL EXAM BP 132/62 mmHg  Pulse 81  Ht '5\' 6"'$  (1.676 m)  Wt 122 lb 9.6 oz (55.611 kg)  BMI 19.80 kg/m2 General appearance: alert, cooperative, appears Older than stated age,appears to be in chronic milds and well-groomed.  HEENT: [place]/AT, EOMI, MMM, anicteric sclera; poor dentition eck: no adenopathy, no carotid bruit and no JVD; soft right carotid bruit Lungs:Minimal air movement bilaterally with diffuse expiratory wheezing. Mild rhonchorous inspiration, no rales.,Hypertympanitic bilaterally, accessory muscle use, but non-labored Heart:  distant S1 and S2, regular rate and rhythm, no murmur, click, rub or gallop; nondisplaced PMI  Abdomen:  scaphoid, soft, non-tender; bowel sounds normal; no masses,  no organomegaly;  Extremities: extremities normal, atraumatic, no cyanosis, and edema  Pulses: 1+ left a DP pulse with faint PT pulse. Unable to palpate right PT or DP  Skin: Dry scaly skin. Extensive brawny/reddish discoloration to bilateral lower extremities from knee down consistent with long-standing PAD and PVD.  Neurologic: Mental status: Alert, oriented, thought content appropriate Cranial nerves: normal (II-XII grossly intact)   Adult ECG Report  Rate: 81 ;  Rhythm: normal sinus rhythm and Normal axis, intervals and durations;   Narrative Interpretation: Normal EKG  Other studies Reviewed: Additional studies/ records that were reviewed today include:  Recent Labs:   Lab Results  Component Value Date   CREATININE 0.69 06/02/2015   Lab  Results  Component Value Date   CHOL 108 08/17/2014  HDL 58 08/17/2014   LDLCALC 40 08/17/2014   TRIG 48 08/17/2014   CHOLHDL 1.9 08/17/2014    ASSESSMENT / PLAN: Problem List Items Addressed This Visit    Tobacco abuse (Chronic)    I did not spend as much time discussing smoking cessation since she has been "brow-beaten" by her pulmonologist and vascular surgery. I simply reiterated their point.      DM (diabetes mellitus), type 2, uncontrolled ([name]) (Chronic)    On insulin. Now seeing endocrinology      Coronary artery disease-multiple stents- last cath 2007- Myoview low risk 4/13 (Chronic)    By report, last MI was in 2005 and last cath in 2007. Since then she's had a Myoview in 2013 that was negative for ischemia. She has opted to proceed with Myoview to reassess her chest pain.  She is currently on aspirin plus Plavix for combination of PAD/PVD. She is on Crestor with relatively recent lipid management by the most recent report we have. At present she is not on a beta blocker or ACE inhibitor/calcium channel blocker. I probably would not use a beta blocker given her COPD. To avoid any coughing issues would not use an ACE inhibitor, but could consider ARB. Preferably would use a vasodilatory agent such as amlodipine for antianginal and PVD affect.       Relevant Medications   isosorbide mononitrate (IMDUR) 30 MG 24 hr tablet   Claudication in peripheral vascular disease ([name]) (Chronic)    Followed by Dr. Donnetta Hutching and associates. Last ABIs indicated reduction in right-sided ABI. Defer to vascular surgery Continue statin, aspirin and Plavix.      Relevant Medications   isosorbide mononitrate (IMDUR) 30 MG 24 hr tablet   Chest pain with high risk for cardiac etiology - Primary    She has several reasons to have exertional chest pain, but what she is describing is Concerning for progressively worsening angina symptoms of at least class II if not III. My initial recommendation  would be to consider possibility of just going straight for ischemic evaluation, however she is not really on much in the way of any anti-anginal medications. She has known CAD history and significant risk factors of diabetes, smoking/COPD, severe anemia and hypertension.  Plan:   Initiate Imdur starting at 50 mg daily titrating up to 30 mg daily.  Per discussion with her she would prefer to try noninvasive evaluation with Myoview stress test.  I will see her back after stress test to discuss results.      Relevant Orders   Myocardial Perfusion Imaging   EKG 12-Lead (Completed)      Current medicines are reviewed at length with the patient today. (+/- concerns) none The following changes have been made:   Start taking Isosorbide mono 15 mg (1/2 tablet of 30 mg tablet) , take 30 minutes after you take daily 81 mg aspirin.  [place] physician recommends that you schedule a follow-up appointment in 2 months with Dr Ellyn Hack -f/u test results   Studies Ordered:   Orders Placed This Encounter  Procedures  . Myocardial Perfusion Imaging  . EKG 12-Lead   The patient is essentially a new patient to me, having not been seen in more than 3 years. However she was seen by one of partners as late as July 2014. She is therefore not a "new patient evaluation ". Despite this there is several years worth of dated the need to be reviewed including cath reports, ABIs, hospitalizations etc. I spent close  to an hour and chart review. I also spent close to half an hour with the patient in direct consultation. Greater than 50% of the time was spent in direct counseling.    Leonie Man, M.D., M.S. Interventional Cardiologist   Pager # [phone] Phone # [phone] [address] [place], [place] [address]

## 2015-09-07 DIAGNOSIS — I209 Angina pectoris, unspecified: Secondary | ICD-10-CM | POA: Insufficient documentation

## 2015-09-07 NOTE — Assessment & Plan Note (Signed)
She has several reasons to have exertional chest pain, but what she is describing is Concerning for progressively worsening angina symptoms of at least class II if not III. My initial recommendation would be to consider possibility of just going straight for ischemic evaluation, however she is not really on much in the way of any anti-anginal medications. She has known CAD history and significant risk factors of diabetes, smoking/COPD, severe anemia and hypertension.  Plan:   Initiate Imdur starting at 50 mg daily titrating up to 30 mg daily.  Per discussion with her she would prefer to try noninvasive evaluation with Myoview stress test.  I will see her back after stress test to discuss results.

## 2015-09-07 NOTE — Assessment & Plan Note (Signed)
By report, last MI was in 2005 and last cath in 2007. Since then she's had a Myoview in 2013 that was negative for ischemia. She has opted to proceed with Myoview to reassess her chest pain.  She is currently on aspirin plus Plavix for combination of PAD/PVD. She is on Crestor with relatively recent lipid management by the most recent report we have. At present she is not on a beta blocker or ACE inhibitor/calcium channel blocker. I probably would not use a beta blocker given her COPD. To avoid any coughing issues would not use an ACE inhibitor, but could consider ARB. Preferably would use a vasodilatory agent such as amlodipine for antianginal and PVD affect.

## 2015-09-07 NOTE — Assessment & Plan Note (Signed)
On insulin. Now seeing endocrinology

## 2015-09-07 NOTE — Assessment & Plan Note (Signed)
Followed by Dr. Donnetta Hutching and associates. Last ABIs indicated reduction in right-sided ABI. Defer to vascular surgery Continue statin, aspirin and Plavix.

## 2015-09-07 NOTE — Assessment & Plan Note (Signed)
I did not spend as much time discussing smoking cessation since she has been "brow-beaten" by her pulmonologist and vascular surgery. I simply reiterated their point.

## 2015-09-13 ENCOUNTER — Encounter: Payer: Self-pay | Admitting: Adult Health

## 2015-09-13 ENCOUNTER — Ambulatory Visit (INDEPENDENT_AMBULATORY_CARE_PROVIDER_SITE_OTHER): Payer: Medicare Other | Admitting: Adult Health

## 2015-09-13 VITALS — BP 122/64 | HR 81 | Temp 98.1°F | Ht 66.0 in | Wt 124.0 lb

## 2015-09-13 DIAGNOSIS — J9611 Chronic respiratory failure with hypoxia: Secondary | ICD-10-CM | POA: Diagnosis not present

## 2015-09-13 DIAGNOSIS — J449 Chronic obstructive pulmonary disease, unspecified: Secondary | ICD-10-CM

## 2015-09-13 DIAGNOSIS — R1310 Dysphagia, unspecified: Secondary | ICD-10-CM | POA: Diagnosis not present

## 2015-09-13 MED ORDER — TIOTROPIUM BROMIDE MONOHYDRATE 2.5 MCG/ACT IN AERS: 2.0000 | INHALATION_SPRAY | Freq: Every day | RESPIRATORY_TRACT | Status: DC

## 2015-09-13 MED ORDER — FLUTICASONE FUROATE-VILANTEROL 100-25 MCG/INH IN AEPB: 1.0000 | INHALATION_SPRAY | Freq: Every day | RESPIRATORY_TRACT | Status: DC

## 2015-09-13 NOTE — Progress Notes (Signed)
Subjective:    Patient ID: Brenda [name], female    DOB: [birth year], 65 y.o.   MRN: [id number]  HPI  65yo female smoker with severe COPD and recurrent PNA   TEST  Simple spirometry FEV1 June 2016 0.67 L (27% predicted) with clear airflow obstruction   09/13/2015 Follow up : COPD  Pt returns for 1 month follow up .  Says she had Flu 1 month ago. This lead to a COPD flare that required prednisone taper.  Sputum cx showed streptococcus pneumoniae-pan sensitive.  , she was tx w/ doxycycline .  She is feeling better and near her baseline. She denies chest pain, orthopnea ,edema or fever.  Appetite is much better . Says she is eating more since cutting back on smoking.  On D3 diet.  CXR on 08/16/15 , with COPD changes Says she has been walking more at home. Trying to build her strength up.  Remains on BREO and Spiriva   Last hospital admission was Dec  25th through December 30 for recurrent pneumonia, sepsis and acute on chronic hypoxic respiratory failure. 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 She has been admitted 4 times for pneumonia over the last year. Unfortunately, she continues to smoke and smoking cessation was discussed Was felt to have a possible component of aspiration leading to her recurrent pneumonia. She underwent a modified barium swallow on February 6 that showed moderate dysphasia. This was considered mildly improved from last modified barium swallow. She was recommended on a dysphagia 3, nectar thick liquids at this time. She is considered a moderate aspiration risk.  On O2 at bedtime.    Past Medical History  Diagnosis Date  . History of DVT (deep vein thrombosis)   . Coronary artery disease     multiple stens  . COPD (chronic obstructive pulmonary disease) ([place])   . Transient ischemic attack   . Depression   . Hx of radiation therapy 01/16/09 - 03/06/09    base of tongue, right neck node  . Peripheral arterial  disease ([name])     status post bilateral femoropopliteal bypass graft performed by Dr. Sherren Mocha early in the past with significant right lower extremity lifestyle limiting claudication  . Pneumonia   . Hypercholesteremia   . Chronic bronchitis ([place])   . Shortness of breath     "w/exertion and sometimes when laying down" (11/26/2012)  . Type II diabetes mellitus ([place])   . History of blood transfusion 1955  . GERD (gastroesophageal reflux disease)   . H/O hiatal hernia   . Stroke [facility]) 2000    left side weakness remains (11/26/2012)  . Arthritis     "hands" (11/26/2012)  . Cancer of base of tongue ([place]) 11/04/2008    "& lymph nodes @ right neck" (11/26/2012  . Myocardial infarction [facility]) 2005    dr Ellyn Hack   . Fatigue 08/23/2013  . Carotid artery occlusion    Current Outpatient Prescriptions on File Prior to Visit  Medication Sig Dispense Refill  . acetaminophen (TYLENOL) 500 MG tablet Take 1,000 mg by mouth every 6 (six) hours as needed for pain.    Marland Kitchen albuterol (PROVENTIL HFA;VENTOLIN HFA) 108 (90 BASE) MCG/ACT inhaler Inhale 2 puffs into the lungs every 6 (six) hours as needed for wheezing or shortness of breath. 1 Inhaler 0  . aspirin EC 81 MG tablet Take 81 mg by mouth daily.    Marland Kitchen buPROPion (WELLBUTRIN XL) 150 MG 24 hr tablet  Take 1 tablet (150 mg total) by mouth daily. 30 tablet 0  . clopidogrel (PLAVIX) 75 MG tablet Take 75 mg by mouth daily.     Marland Kitchen docusate sodium (COLACE) 100 MG capsule Take 100 mg by mouth daily.    . fish oil-omega-3 fatty acids 1000 MG capsule Take 1 g by mouth 2 (two) times daily.     . fluticasone (FLONASE) 50 MCG/ACT nasal spray Place 2 sprays into both nostrils daily. 16 g 2  . Fluticasone Furoate-Vilanterol (BREO ELLIPTA) 100-25 MCG/INH AEPB Inhale 1 puff into the lungs daily. 60 each 5  . guaiFENesin (MUCINEX) 600 MG 12 hr tablet Take 2 tablets (1,200 mg total) by mouth 2 (two) times daily. 60 tablet 3  . insulin lispro (HUMALOG) 100 UNIT/ML injection Inject 3  Units into the skin 3 (three) times daily with meals as needed for high blood sugar.     . ipratropium-albuterol (DUONEB) 0.5-2.5 (3) MG/3ML SOLN Take 3 mLs by nebulization every 6 (six) hours. 360 mL 3  . loratadine (CLARITIN) 10 MG tablet Take 1 tablet (10 mg total) by mouth daily. 30 tablet 0  . Multiple Vitamins-Minerals (MULTIVITAMIN & MINERAL PO) Take 1 tablet by mouth daily.    . nitroGLYCERIN (NITROLINGUAL) 0.4 MG/SPRAY spray Place 1 spray under the tongue every 5 (five) minutes as needed for chest pain. 12 g 0  . omeprazole (PRILOSEC) 20 MG capsule Take 20 mg by mouth daily.     . rosuvastatin (CRESTOR) 20 MG tablet Take 20 mg by mouth daily.    . Tiotropium Bromide Monohydrate (SPIRIVA RESPIMAT) 2.5 MCG/ACT AERS Inhale 2 puffs into the lungs daily. 1 Inhaler 11  . isosorbide mononitrate (IMDUR) 30 MG 24 hr tablet Take 0.5 tablets (15 mg total) by mouth daily. (Patient not taking: Reported on 09/13/2015) 15 tablet 3   No current facility-administered medications on file prior to visit.     Review of Systems  Constitutional:   No  weight loss, night sweats,  Fevers, chills,  +atigue, or  lassitude.  HEENT:   No headaches,  Difficulty swallowing,  Tooth/dental problems, or  Sore throat,                No sneezing, itching, ear ache,  +nasal congestion, post nasal drip,   CV:  No chest pain,  Orthopnea, PND, swelling in lower extremities, anasarca, dizziness, palpitations, syncope.   GI  No heartburn, indigestion, abdominal pain, nausea, vomiting, diarrhea, change in bowel habits, loss of appetite, bloody stools.   Resp:    No chest wall deformity  Skin: no rash or lesions.  GU: no dysuria, change in color of urine, no urgency or frequency.  No flank pain, no hematuria   MS:  No joint pain or swelling.  No decreased range of motion.  No back pain.  Psych:  No change in mood or affect. No depression or anxiety.  No memory loss.         Objective:   Physical  Exam  Filed Vitals:   09/13/15 1208  BP: 122/64  Pulse: 81  Temp: 98.1 F (36.7 C)  TempSrc: Oral  Height: '5\' 6"'$  (1.676 m)  Weight: 124 lb (56.246 kg)  SpO2: 97%   GEN: A/Ox3; pleasant , NAD, frail and elderly   HEENT:  [place]/AT,  EACs-clear, TMs-wnl, NOSE-clear, THROAT-clear, no lesions, no postnasal drip or exudate noted.   NECK:  Supple w/ fair ROM; no JVD; normal carotid impulses w/o bruits; no thyromegaly or  nodules palpated; no lymphadenopathy.  RESP  Decreased BS in bases , , no accessory muscle use, no dullness to percussion, Kyphosis noted.   CARD:  RRR, no m/r/g  , tr  peripheral edema, pulses intact, no cyanosis or clubbing.  GI:   Soft & nt; nml bowel sounds; no organomegaly or masses detected.  Musco: Warm bil, no deformities or joint swelling noted.   Neuro: alert, no focal deficits noted.    Skin: Warm, no lesions or rashes    Arjan Strohm NP-C  [facility]  09/13/2015      Assessment & Plan:

## 2015-09-13 NOTE — Assessment & Plan Note (Signed)
Continue on D3 diet and nectar thick liquids.  Do not eat 3 hr before bedtime, stay upright for at least 2 hr after eating.  Follow up with Dr. Lake Bells in 2-3 month as planned and As needed   Please contact office for sooner follow up if symptoms do not improve or worsen or seek emergency care

## 2015-09-13 NOTE — Patient Instructions (Signed)
Continue on BREO and Spiriva .  Continue on D3 diet and nectar thick liquids.  Do not eat 3 hr before bedtime, stay upright for at least 2 hr after eating.  Work on not smoking.  Continue on Oxygen 3l/m At bedtime   Follow up with Dr. Lake Bells in 2-3 month as planned and As needed   Please contact office for sooner follow up if symptoms do not improve or worsen or seek emergency care

## 2015-09-13 NOTE — Addendum Note (Signed)
Addended by: Osa Craver on: 09/13/2015 12:36 PM   Modules accepted: Orders

## 2015-09-13 NOTE — Assessment & Plan Note (Signed)
Recent flare now resolved  Plan  Continue on BREO and Spiriva .  Continue on D3 diet and nectar thick liquids.  Do not eat 3 hr before bedtime, stay upright for at least 2 hr after eating.  Work on not smoking.  Continue on Oxygen 3l/m At bedtime   Follow up with Dr. Lake Bells in 2-3 month as planned and As needed   Please contact office for sooner follow up if symptoms do not improve or worsen or seek emergency care

## 2015-09-13 NOTE — Assessment & Plan Note (Signed)
Continue on Oxygen 3l/m At bedtime   Follow up with Dr. Lake Bells in 2-3 month as planned and As needed   Please contact office for sooner follow up if symptoms do not improve or worsen or seek emergency care

## 2015-09-17 NOTE — Progress Notes (Signed)
Reviewed, agree with this plan

## 2015-09-21 ENCOUNTER — Telehealth (HOSPITAL_COMMUNITY): Payer: Self-pay

## 2015-09-21 NOTE — Telephone Encounter (Signed)
Encounter complete. 

## 2015-09-26 ENCOUNTER — Encounter (HOSPITAL_COMMUNITY): Payer: Self-pay | Admitting: *Deleted

## 2015-09-26 ENCOUNTER — Encounter: Payer: Self-pay | Admitting: *Deleted

## 2015-09-26 ENCOUNTER — Encounter: Payer: Self-pay | Admitting: Physician Assistant

## 2015-09-26 ENCOUNTER — Ambulatory Visit (HOSPITAL_COMMUNITY)
Admission: RE | Admit: 2015-09-26 | Discharge: 2015-09-26 | Disposition: A | Discharge status: Home / Self Care | Payer: Medicare Other | Source: Ambulatory Visit | Attending: Cardiovascular Disease | Admitting: Cardiovascular Disease

## 2015-09-26 ENCOUNTER — Ambulatory Visit (INDEPENDENT_AMBULATORY_CARE_PROVIDER_SITE_OTHER): Payer: Medicare Other | Admitting: Physician Assistant

## 2015-09-26 VITALS — BP 116/68 | HR 80 | Ht 66.0 in | Wt 124.0 lb

## 2015-09-26 VITALS — Ht 66.0 in | Wt 122.0 lb

## 2015-09-26 DIAGNOSIS — E785 Hyperlipidemia, unspecified: Secondary | ICD-10-CM | POA: Diagnosis present

## 2015-09-26 DIAGNOSIS — R42 Dizziness and giddiness: Secondary | ICD-10-CM | POA: Insufficient documentation

## 2015-09-26 DIAGNOSIS — R9439 Abnormal result of other cardiovascular function study: Secondary | ICD-10-CM | POA: Diagnosis present

## 2015-09-26 DIAGNOSIS — R079 Chest pain, unspecified: Secondary | ICD-10-CM

## 2015-09-26 DIAGNOSIS — I2510 Atherosclerotic heart disease of native coronary artery without angina pectoris: Secondary | ICD-10-CM

## 2015-09-26 DIAGNOSIS — R0609 Other forms of dyspnea: Secondary | ICD-10-CM | POA: Diagnosis not present

## 2015-09-26 DIAGNOSIS — I739 Peripheral vascular disease, unspecified: Secondary | ICD-10-CM | POA: Diagnosis present

## 2015-09-26 DIAGNOSIS — Z8249 Family history of ischemic heart disease and other diseases of the circulatory system: Secondary | ICD-10-CM | POA: Diagnosis not present

## 2015-09-26 DIAGNOSIS — R5383 Other fatigue: Secondary | ICD-10-CM | POA: Insufficient documentation

## 2015-09-26 DIAGNOSIS — E119 Type 2 diabetes mellitus without complications: Secondary | ICD-10-CM | POA: Diagnosis not present

## 2015-09-26 DIAGNOSIS — Z720 Tobacco use: Secondary | ICD-10-CM | POA: Diagnosis not present

## 2015-09-26 DIAGNOSIS — I2583 Coronary atherosclerosis due to lipid rich plaque: Secondary | ICD-10-CM

## 2015-09-26 DIAGNOSIS — I25119 Atherosclerotic heart disease of native coronary artery with unspecified angina pectoris: Secondary | ICD-10-CM | POA: Diagnosis present

## 2015-09-26 DIAGNOSIS — I209 Angina pectoris, unspecified: Secondary | ICD-10-CM | POA: Diagnosis present

## 2015-09-26 LAB — MYOCARDIAL PERFUSION IMAGING
CHL CUP NUCLEAR SDS: 8
CHL CUP RESTING HR STRESS: 72 {beats}/min
CSEPPHR: 86 {beats}/min
LV dias vol: 80 mL (ref 46–106)
LVSYSVOL: 40 mL
SRS: 1
SSS: 9
TID: 1.18

## 2015-09-26 MED ORDER — TECHNETIUM TC 99M SESTAMIBI GENERIC - CARDIOLITE
10.5000 | Freq: Once | INTRAVENOUS | Status: AC | PRN
  Administered 2015-09-26: 11 via INTRAVENOUS

## 2015-09-26 MED ORDER — REGADENOSON 0.4 MG/5ML IV SOLN
0.4000 mg | Freq: Once | INTRAVENOUS | Status: AC
  Administered 2015-09-26: 0.4 mg via INTRAVENOUS

## 2015-09-26 MED ORDER — TECHNETIUM TC 99M SESTAMIBI GENERIC - CARDIOLITE
29.4000 | Freq: Once | INTRAVENOUS | Status: AC | PRN
  Administered 2015-09-26: 29 via INTRAVENOUS

## 2015-09-26 NOTE — Progress Notes (Signed)
Dr Gwenlyn Found reviewed Cardiolite study. Pt will see Tarri Fuller, PA-C today, September 26, 2015 to discuss results of her Cardiolite test.Lenisha Lacap, Lesleigh Noe

## 2015-09-26 NOTE — Progress Notes (Signed)
Patient ID: Brenda [name], female   DOB: 11/01/1950, 65 y.o.   MRN: [id number]    Date:  09/26/2015   ID:  Brenda [name], DOB 1951-03-29, MRN [id number]  PCP:  [name], MD  Primary Cardiologist:  Ellyn Hack  Chief Complaint  Patient presents with  . Follow-up    swelling in Lt ankle, shortness of breath, dizziness & lightheadedness     History of Present Illness: Brenda [name] is a 65 y.o. female with a PMH of both CAD and PAD/PVDOD, continued tobacco abuse.  The rest of her history as listed below.  Dr. Donnetta Hutching follow her PAD She was seen on 09/05/2015 by Dr. Ellyn Hack with complaints of exertional chest discomfort and burning. She essentially set up for Lexiscan stress test which was completed today and was high risk. She was added on to the clinic schedule so we could discuss left heart catheterization.  She reports that the last time she used her nitroglycerin was about 1.5 weeks ago.  She has chronic periodic nausea for the last 10 years.  The patient currently denies vomiting, fever, shortness of breath, orthopnea, dizziness, PND, cough, congestion, abdominal pain, hematochezia, melena, lower extremity edema, claudication.  Wt Readings from Last 3 Encounters:  09/26/15 124 lb (56.246 kg)  09/26/15 122 lb (55.339 kg)  09/13/15 124 lb (56.246 kg)     Past Medical History  Diagnosis Date  . History of DVT (deep vein thrombosis)   . Coronary artery disease     multiple stens  . COPD (chronic obstructive pulmonary disease) ([place])   . Transient ischemic attack   . Depression   . Hx of radiation therapy 01/16/09 - 03/06/09    base of tongue, right neck node  . Peripheral arterial disease ([name])     status post bilateral femoropopliteal bypass graft performed by Dr. Sherren Mocha early in the past with significant right lower extremity lifestyle limiting claudication  . Pneumonia   . Hypercholesteremia   . Chronic bronchitis ([place])   . Shortness of breath     "w/exertion and sometimes  when laying down" (11/26/2012)  . Type II diabetes mellitus ([place])   . History of blood transfusion 1955  . GERD (gastroesophageal reflux disease)   . H/O hiatal hernia   . Stroke [facility]) 2000    left side weakness remains (11/26/2012)  . Arthritis     "hands" (11/26/2012)  . Cancer of base of tongue ([place]) 11/04/2008    "& lymph nodes @ right neck" (11/26/2012  . Myocardial infarction [facility]) 2005    dr Ellyn Hack   . Fatigue 08/23/2013  . Carotid artery occlusion     Current Outpatient Prescriptions  Medication Sig Dispense Refill  . acetaminophen (TYLENOL) 500 MG tablet Take 1,000 mg by mouth every 6 (six) hours as needed for pain.    Marland Kitchen albuterol (PROVENTIL HFA;VENTOLIN HFA) 108 (90 BASE) MCG/ACT inhaler Inhale 2 puffs into the lungs every 6 (six) hours as needed for wheezing or shortness of breath. 1 Inhaler 0  . aspirin EC 81 MG tablet Take 81 mg by mouth daily.    Marland Kitchen buPROPion (WELLBUTRIN XL) 300 MG 24 hr tablet     . clopidogrel (PLAVIX) 75 MG tablet Take 75 mg by mouth daily.     Marland Kitchen docusate sodium (COLACE) 100 MG capsule Take 100 mg by mouth daily.    . fish oil-omega-3 fatty acids 1000 MG capsule Take 1 g by mouth 2 (two) times daily.     Marland Kitchen  fluticasone (FLONASE) 50 MCG/ACT nasal spray Place 2 sprays into both nostrils daily. 16 g 2  . fluticasone furoate-vilanterol (BREO ELLIPTA) 100-25 MCG/INH AEPB Inhale 1 puff into the lungs daily. 60 each 5  . guaiFENesin (MUCINEX) 600 MG 12 hr tablet Take 2 tablets (1,200 mg total) by mouth 2 (two) times daily. 60 tablet 3  . insulin lispro (HUMALOG) 100 UNIT/ML injection Inject 3 Units into the skin 3 (three) times daily with meals as needed for high blood sugar.     . ipratropium-albuterol (DUONEB) 0.5-2.5 (3) MG/3ML SOLN Take 3 mLs by nebulization every 6 (six) hours. 360 mL 3  . loratadine (CLARITIN) 10 MG tablet Take 1 tablet (10 mg total) by mouth daily. 30 tablet 0  . Multiple Vitamins-Minerals (MULTIVITAMIN & MINERAL PO) Take 1 tablet by mouth  daily.    . nitroGLYCERIN (NITROLINGUAL) 0.4 MG/SPRAY spray Place 1 spray under the tongue every 5 (five) minutes as needed for chest pain. 12 g 0  . omeprazole (PRILOSEC) 20 MG capsule Take 20 mg by mouth daily.     . rosuvastatin (CRESTOR) 20 MG tablet Take 20 mg by mouth daily.    . Tiotropium Bromide Monohydrate (SPIRIVA RESPIMAT) 2.5 MCG/ACT AERS Inhale 2 puffs into the lungs daily. 1 Inhaler 5   No current facility-administered medications for this visit.    Allergies:    Allergies  Allergen Reactions  . Tape Hives and Other (See Comments)    USE PAPER TAPE ONLY- Adhesive peels off skin-makes pt. Raw.  Maryjo Rochester [Nicotine] Rash    To the PATCH only, breakouts on skin    Social History:  The patient  reports that she has been smoking Cigarettes.  She has a 50 pack-year smoking history. She has never used smokeless tobacco. She reports that she drinks about 3.0 oz of alcohol per week. She reports that she does not use illicit drugs.   Family history:   Family History  Problem Relation Age of Onset  . Cancer Mother     pancreatic  . Diabetes Mother   . Hyperlipidemia Mother   . Hypertension Mother   . Other Mother     varicose vein  . Cancer Father 72    throat  . Heart disease Father   . Heart attack Father   . Cancer Sister     breast  . Diabetes Sister   . Deep vein thrombosis Brother   . Diabetes Brother   . Hearing loss Brother   . Hyperlipidemia Brother   . Hypertension Brother   . Heart attack Brother   . Clotting disorder Brother   . Diabetes Son   . Hyperlipidemia Son     ROS:  Please see the history of present illness.  All other systems reviewed and negative.   PHYSICAL EXAM: VS:  BP 116/68 mmHg  Pulse 80  Ht '5\' 6"'$  (1.676 m)  Wt 124 lb (56.246 kg)  BMI 20.02 kg/m2 Well nourished, well developed, in no acute distress HEENT: Pupils are equal round react to light accommodation extraocular movements are intact.  Neck: no JVDNo cervical  lymphadenopathy. Cardiac: Regular rate and rhythm without murmurs rubs or gallops. Lungs:  clear to auscultation bilaterally, no wheezing, rhonchi or rales Abd: soft, nontender, positive bowel sounds all quadrants, no hepatosplenomegaly Ext: 1+ left > right lower extremity edema.  2+ radial and 1+ dorsalis pedis pulses. Skin: warm and dry Neuro:  Grossly normal     ASSESSMENT AND PLAN:  Problem List  Items Addressed This Visit    Tobacco abuse - Primary (Chronic)   Positive cardiac stress test   Coronary artery disease-multiple stents- last cath 2007- Myoview low risk 4/13 (Chronic)   Chest pain with high risk for cardiac etiology     Patient underwent cardiac stress testing today which was high risk. She was scheduled for left heart catheterization on May 5 with Dr. Ellyn Hack. We'll schedule precath labs and chest x-ray.  We discussed tobacco cessation.  The patient understands that risks include but are not limited to stroke (1 in 1000), death (1 in 52), kidney failure [usually temporary] (1 in 500), bleeding (1 in 200), allergic reaction [possibly serious] (1 in 200). The patient understands and is willing to proceed.

## 2015-09-26 NOTE — Patient Instructions (Addendum)
Medication Instructions: Your physician recommends that you continue on your current medications as directed. Please refer to the Current Medication list given to you today.    If you need a refill on your cardiac medications before your next appointment, please call your pharmacy.  Labwork:  [facility] TO [facility] ON FIRST FLOOR AT [facility] 10/02/15.Marland Kitchen   Testing/Procedures: SEE LETTER  FOR LEFT HEART CATH ON 10/06/15   Follow-Up: AS ALREADY SCHEDULED IN June WITH DR [facility]    Any Other Special Instructions Will Be Listed Below (If Applicable).

## 2015-10-02 ENCOUNTER — Other Ambulatory Visit: Payer: Self-pay | Admitting: *Deleted

## 2015-10-02 DIAGNOSIS — R9439 Abnormal result of other cardiovascular function study: Secondary | ICD-10-CM

## 2015-10-02 LAB — CBC
HEMATOCRIT: 42 % (ref 35.0–45.0)
HEMOGLOBIN: 13.6 g/dL (ref 11.7–15.5)
MCH: 29.3 pg (ref 27.0–33.0)
MCHC: 32.4 g/dL (ref 32.0–36.0)
MCV: 90.5 fL (ref 80.0–100.0)
MPV: 10.3 fL (ref 7.5–12.5)
Platelets: 153 10*3/uL (ref 140–400)
RBC: 4.64 MIL/uL (ref 3.80–5.10)
RDW: 14.6 % (ref 11.0–15.0)
WBC: 4.5 10*3/uL (ref 3.8–10.8)

## 2015-10-02 LAB — BASIC METABOLIC PANEL
BUN: 10 mg/dL (ref 7–25)
CALCIUM: 8.9 mg/dL (ref 8.6–10.4)
CHLORIDE: 100 mmol/L (ref 98–110)
CO2: 30 mmol/L (ref 20–31)
CREATININE: 0.69 mg/dL (ref 0.50–0.99)
GLUCOSE: 303 mg/dL — AB (ref 65–99)
Potassium: 4.3 mmol/L (ref 3.5–5.3)
Sodium: 136 mmol/L (ref 135–146)

## 2015-10-02 LAB — APTT: aPTT: 35 seconds (ref 24–37)

## 2015-10-05 ENCOUNTER — Other Ambulatory Visit: Payer: Self-pay

## 2015-10-05 ENCOUNTER — Inpatient Hospital Stay (HOSPITAL_BASED_OUTPATIENT_CLINIC_OR_DEPARTMENT_OTHER)
Admission: EM | Admit: 2015-10-05 | Discharge: 2015-10-07 | DRG: 246 | Disposition: A | Discharge status: Home / Self Care | Payer: Medicare Other | Attending: Internal Medicine | Admitting: Internal Medicine

## 2015-10-05 ENCOUNTER — Telehealth: Payer: Self-pay | Admitting: Cardiology

## 2015-10-05 ENCOUNTER — Encounter (HOSPITAL_BASED_OUTPATIENT_CLINIC_OR_DEPARTMENT_OTHER): Payer: Self-pay | Admitting: *Deleted

## 2015-10-05 ENCOUNTER — Emergency Department (HOSPITAL_BASED_OUTPATIENT_CLINIC_OR_DEPARTMENT_OTHER): Payer: Medicare Other

## 2015-10-05 DIAGNOSIS — E440 Moderate protein-calorie malnutrition: Secondary | ICD-10-CM | POA: Diagnosis not present

## 2015-10-05 DIAGNOSIS — IMO0002 Reserved for concepts with insufficient information to code with codable children: Secondary | ICD-10-CM | POA: Diagnosis present

## 2015-10-05 DIAGNOSIS — Z681 Body mass index (BMI) 19 or less, adult: Secondary | ICD-10-CM

## 2015-10-05 DIAGNOSIS — J9621 Acute and chronic respiratory failure with hypoxia: Secondary | ICD-10-CM | POA: Diagnosis not present

## 2015-10-05 DIAGNOSIS — J441 Chronic obstructive pulmonary disease with (acute) exacerbation: Secondary | ICD-10-CM | POA: Diagnosis not present

## 2015-10-05 DIAGNOSIS — Z7902 Long term (current) use of antithrombotics/antiplatelets: Secondary | ICD-10-CM

## 2015-10-05 DIAGNOSIS — Z7982 Long term (current) use of aspirin: Secondary | ICD-10-CM

## 2015-10-05 DIAGNOSIS — I25119 Atherosclerotic heart disease of native coronary artery with unspecified angina pectoris: Secondary | ICD-10-CM

## 2015-10-05 DIAGNOSIS — Z9842 Cataract extraction status, left eye: Secondary | ICD-10-CM

## 2015-10-05 DIAGNOSIS — F32A Depression, unspecified: Secondary | ICD-10-CM | POA: Diagnosis present

## 2015-10-05 DIAGNOSIS — I2510 Atherosclerotic heart disease of native coronary artery without angina pectoris: Secondary | ICD-10-CM

## 2015-10-05 DIAGNOSIS — E1151 Type 2 diabetes mellitus with diabetic peripheral angiopathy without gangrene: Secondary | ICD-10-CM | POA: Diagnosis present

## 2015-10-05 DIAGNOSIS — Z9841 Cataract extraction status, right eye: Secondary | ICD-10-CM

## 2015-10-05 DIAGNOSIS — R9431 Abnormal electrocardiogram [ECG] [EKG]: Secondary | ICD-10-CM

## 2015-10-05 DIAGNOSIS — F17210 Nicotine dependence, cigarettes, uncomplicated: Secondary | ICD-10-CM | POA: Diagnosis present

## 2015-10-05 DIAGNOSIS — R0602 Shortness of breath: Secondary | ICD-10-CM | POA: Diagnosis not present

## 2015-10-05 DIAGNOSIS — Z85810 Personal history of malignant neoplasm of tongue: Secondary | ICD-10-CM

## 2015-10-05 DIAGNOSIS — I25110 Atherosclerotic heart disease of native coronary artery with unstable angina pectoris: Principal | ICD-10-CM | POA: Diagnosis present

## 2015-10-05 DIAGNOSIS — Z86718 Personal history of other venous thrombosis and embolism: Secondary | ICD-10-CM

## 2015-10-05 DIAGNOSIS — Z8673 Personal history of transient ischemic attack (TIA), and cerebral infarction without residual deficits: Secondary | ICD-10-CM

## 2015-10-05 DIAGNOSIS — Z923 Personal history of irradiation: Secondary | ICD-10-CM

## 2015-10-05 DIAGNOSIS — I252 Old myocardial infarction: Secondary | ICD-10-CM

## 2015-10-05 DIAGNOSIS — E1165 Type 2 diabetes mellitus with hyperglycemia: Secondary | ICD-10-CM | POA: Diagnosis present

## 2015-10-05 DIAGNOSIS — I739 Peripheral vascular disease, unspecified: Secondary | ICD-10-CM | POA: Diagnosis not present

## 2015-10-05 DIAGNOSIS — Z720 Tobacco use: Secondary | ICD-10-CM | POA: Diagnosis present

## 2015-10-05 DIAGNOSIS — Z794 Long term (current) use of insulin: Secondary | ICD-10-CM

## 2015-10-05 DIAGNOSIS — K219 Gastro-esophageal reflux disease without esophagitis: Secondary | ICD-10-CM | POA: Diagnosis present

## 2015-10-05 DIAGNOSIS — E785 Hyperlipidemia, unspecified: Secondary | ICD-10-CM | POA: Diagnosis present

## 2015-10-05 DIAGNOSIS — J449 Chronic obstructive pulmonary disease, unspecified: Secondary | ICD-10-CM | POA: Diagnosis present

## 2015-10-05 DIAGNOSIS — F329 Major depressive disorder, single episode, unspecified: Secondary | ICD-10-CM | POA: Diagnosis present

## 2015-10-05 DIAGNOSIS — J069 Acute upper respiratory infection, unspecified: Secondary | ICD-10-CM | POA: Diagnosis present

## 2015-10-05 DIAGNOSIS — R9439 Abnormal result of other cardiovascular function study: Secondary | ICD-10-CM | POA: Diagnosis present

## 2015-10-05 DIAGNOSIS — G459 Transient cerebral ischemic attack, unspecified: Secondary | ICD-10-CM | POA: Diagnosis present

## 2015-10-05 DIAGNOSIS — Z961 Presence of intraocular lens: Secondary | ICD-10-CM | POA: Diagnosis present

## 2015-10-05 DIAGNOSIS — T82855A Stenosis of coronary artery stent, initial encounter: Secondary | ICD-10-CM | POA: Diagnosis present

## 2015-10-05 DIAGNOSIS — I209 Angina pectoris, unspecified: Secondary | ICD-10-CM

## 2015-10-05 DIAGNOSIS — Z9861 Coronary angioplasty status: Secondary | ICD-10-CM

## 2015-10-05 LAB — COMPREHENSIVE METABOLIC PANEL
ALBUMIN: 3.7 g/dL (ref 3.5–5.0)
ALT: 18 U/L (ref 14–54)
ANION GAP: 7 (ref 5–15)
AST: 22 U/L (ref 15–41)
Alkaline Phosphatase: 135 U/L — ABNORMAL HIGH (ref 38–126)
BUN: 12 mg/dL (ref 6–20)
CHLORIDE: 94 mmol/L — AB (ref 101–111)
CO2: 31 mmol/L (ref 22–32)
Calcium: 9 mg/dL (ref 8.9–10.3)
Creatinine, Ser: 0.78 mg/dL (ref 0.44–1.00)
GFR calc Af Amer: >60 mL/min (ref >60)
GFR calc non Af Amer: >60 mL/min (ref >60)
GLUCOSE: 327 mg/dL — AB (ref 65–99)
POTASSIUM: 4.2 mmol/L (ref 3.5–5.1)
SODIUM: 132 mmol/L — AB (ref 135–145)
Total Bilirubin: 1 mg/dL (ref 0.3–1.2)
Total Protein: 6.9 g/dL (ref 6.5–8.1)

## 2015-10-05 LAB — CBC WITH DIFFERENTIAL/PLATELET
Basophils Absolute: 0.1 10*3/uL (ref 0.0–0.1)
Basophils Relative: 1 %
EOS PCT: 0 %
Eosinophils Absolute: 0 10*3/uL (ref 0.0–0.7)
HEMATOCRIT: 41.2 % (ref 36.0–46.0)
Hemoglobin: 14 g/dL (ref 12.0–15.0)
LYMPHS ABS: 0.4 10*3/uL — AB (ref 0.7–4.0)
LYMPHS PCT: 6 %
MCH: 30.7 pg (ref 26.0–34.0)
MCHC: 34 g/dL (ref 30.0–36.0)
MCV: 90.4 fL (ref 78.0–100.0)
MONO ABS: 0.4 10*3/uL (ref 0.1–1.0)
MONOS PCT: 6 %
Neutro Abs: 6.6 10*3/uL (ref 1.7–7.7)
Neutrophils Relative %: 87 %
PLATELETS: 152 10*3/uL (ref 150–400)
RBC: 4.56 MIL/uL (ref 3.87–5.11)
RDW: 15.4 % (ref 11.5–15.5)
WBC: 7.5 10*3/uL (ref 4.0–10.5)

## 2015-10-05 LAB — GLUCOSE, CAPILLARY: Glucose-Capillary: 369 mg/dL — ABNORMAL HIGH (ref 65–99)

## 2015-10-05 LAB — PROTIME-INR
INR: 0.92 (ref 0.00–1.49)
Prothrombin Time: 12.6 seconds (ref 11.6–15.2)

## 2015-10-05 LAB — TROPONIN I: Troponin I: <0.03 ng/mL (ref <0.031)

## 2015-10-05 MED ORDER — ASPIRIN 81 MG PO CHEW
324.0000 mg | CHEWABLE_TABLET | Freq: Once | ORAL | Status: AC
  Administered 2015-10-05: 324 mg via ORAL
  Filled 2015-10-05: qty 4

## 2015-10-05 MED ORDER — IPRATROPIUM-ALBUTEROL 0.5-2.5 (3) MG/3ML IN SOLN
3.0000 mL | RESPIRATORY_TRACT | Status: DC
  Administered 2015-10-06: 3 mL via RESPIRATORY_TRACT
  Filled 2015-10-05: qty 3

## 2015-10-05 MED ORDER — NITROGLYCERIN 0.4 MG/SPRAY TL SOLN: 1.0000 | Status: DC | PRN

## 2015-10-05 MED ORDER — FLUTICASONE FUROATE-VILANTEROL 100-25 MCG/INH IN AEPB
1.0000 | INHALATION_SPRAY | Freq: Every day | RESPIRATORY_TRACT | Status: DC
  Administered 2015-10-06: 1 via RESPIRATORY_TRACT
  Filled 2015-10-05 (×2): qty 28

## 2015-10-05 MED ORDER — METHYLPREDNISOLONE SODIUM SUCC 125 MG IJ SOLR
80.0000 mg | Freq: Once | INTRAMUSCULAR | Status: AC
  Administered 2015-10-05: 80 mg via INTRAVENOUS
  Filled 2015-10-05: qty 2

## 2015-10-05 MED ORDER — ALBUTEROL SULFATE (2.5 MG/3ML) 0.083% IN NEBU: 2.5000 mg | INHALATION_SOLUTION | RESPIRATORY_TRACT | Status: AC | PRN

## 2015-10-05 MED ORDER — SODIUM CHLORIDE 0.9 % IV SOLN
Freq: Once | INTRAVENOUS | Status: AC
Start: 2015-10-05 — End: 2015-10-05
  Administered 2015-10-05: 22:00:00 via INTRAVENOUS

## 2015-10-05 MED ORDER — METHYLPREDNISOLONE SODIUM SUCC 125 MG IJ SOLR
60.0000 mg | Freq: Three times a day (TID) | INTRAMUSCULAR | Status: DC
Start: 2015-10-05 — End: 2015-10-06
  Administered 2015-10-05 – 2015-10-06 (×2): 60 mg via INTRAVENOUS
  Filled 2015-10-05 (×2): qty 2

## 2015-10-05 MED ORDER — OMEGA-3 FATTY ACIDS 1000 MG PO CAPS: 1.0000 g | ORAL_CAPSULE | Freq: Two times a day (BID) | ORAL | Status: DC

## 2015-10-05 MED ORDER — PANTOPRAZOLE SODIUM 40 MG PO TBEC
40.0000 mg | DELAYED_RELEASE_TABLET | Freq: Every day | ORAL | Status: DC
  Administered 2015-10-05 – 2015-10-07 (×3): 40 mg via ORAL
  Filled 2015-10-05 (×3): qty 1

## 2015-10-05 MED ORDER — ROSUVASTATIN CALCIUM 20 MG PO TABS
20.0000 mg | ORAL_TABLET | Freq: Every day | ORAL | Status: DC
  Administered 2015-10-05 – 2015-10-07 (×3): 20 mg via ORAL
  Filled 2015-10-05 (×3): qty 1

## 2015-10-05 MED ORDER — ACETAMINOPHEN 325 MG PO TABS: 650.0000 mg | ORAL_TABLET | Freq: Four times a day (QID) | ORAL | Status: DC | PRN

## 2015-10-05 MED ORDER — SODIUM CHLORIDE 0.9 % IV BOLUS (SEPSIS)
500.0000 mL | Freq: Once | INTRAVENOUS | Status: AC
  Administered 2015-10-05: 500 mL via INTRAVENOUS

## 2015-10-05 MED ORDER — SALINE SPRAY 0.65 % NA SOLN: 1.0000 | NASAL | Status: DC | PRN

## 2015-10-05 MED ORDER — OMEGA-3-ACID ETHYL ESTERS 1 G PO CAPS
1.0000 g | ORAL_CAPSULE | Freq: Two times a day (BID) | ORAL | Status: DC
  Administered 2015-10-06 – 2015-10-07 (×3): 1 g via ORAL
  Filled 2015-10-05 (×4): qty 1

## 2015-10-05 MED ORDER — INSULIN ASPART 100 UNIT/ML ~~LOC~~ SOLN
0.0000 [IU] | Freq: Three times a day (TID) | SUBCUTANEOUS | Status: DC
  Administered 2015-10-06: 18:00:00 7 [IU] via SUBCUTANEOUS
  Administered 2015-10-06: 2 [IU] via SUBCUTANEOUS
  Administered 2015-10-07: 07:00:00 1 [IU] via SUBCUTANEOUS

## 2015-10-05 MED ORDER — LORATADINE 10 MG PO TABS
10.0000 mg | ORAL_TABLET | Freq: Every day | ORAL | Status: DC
  Administered 2015-10-06 – 2015-10-07 (×2): 10 mg via ORAL
  Filled 2015-10-05 (×2): qty 1

## 2015-10-05 MED ORDER — IPRATROPIUM-ALBUTEROL 0.5-2.5 (3) MG/3ML IN SOLN
3.0000 mL | Freq: Four times a day (QID) | RESPIRATORY_TRACT | Status: DC
  Administered 2015-10-05: 3 mL via RESPIRATORY_TRACT
  Filled 2015-10-05: qty 3

## 2015-10-05 MED ORDER — INSULIN ASPART 100 UNIT/ML ~~LOC~~ SOLN
0.0000 [IU] | Freq: Every day | SUBCUTANEOUS | Status: DC
  Administered 2015-10-05: 5 [IU] via SUBCUTANEOUS
  Administered 2015-10-06: 22:00:00 2 [IU] via SUBCUTANEOUS

## 2015-10-05 MED ORDER — LEVOFLOXACIN IN D5W 500 MG/100ML IV SOLN
500.0000 mg | Freq: Once | INTRAVENOUS | Status: AC
  Administered 2015-10-05: 500 mg via INTRAVENOUS
  Filled 2015-10-05: qty 100

## 2015-10-05 MED ORDER — ASPIRIN EC 81 MG PO TBEC
81.0000 mg | DELAYED_RELEASE_TABLET | Freq: Every day | ORAL | Status: DC
Start: 2015-10-06 — End: 2015-10-07
  Administered 2015-10-06 – 2015-10-07 (×2): 81 mg via ORAL
  Filled 2015-10-05 (×2): qty 1

## 2015-10-05 MED ORDER — FLUTICASONE PROPIONATE 50 MCG/ACT NA SUSP
2.0000 | Freq: Every day | NASAL | Status: DC
  Administered 2015-10-06 – 2015-10-07 (×2): 2 via NASAL
  Filled 2015-10-05 (×2): qty 16

## 2015-10-05 MED ORDER — ALBUTEROL SULFATE (2.5 MG/3ML) 0.083% IN NEBU: 2.5000 mg | INHALATION_SOLUTION | RESPIRATORY_TRACT | Status: DC | PRN

## 2015-10-05 MED ORDER — SODIUM CHLORIDE 0.9 % IV SOLN
Freq: Once | INTRAVENOUS | Status: AC
  Administered 2015-10-05: 16:00:00 via INTRAVENOUS

## 2015-10-05 MED ORDER — ADULT MULTIVITAMIN W/MINERALS CH
1.0000 | ORAL_TABLET | Freq: Every day | ORAL | Status: DC
  Administered 2015-10-06 – 2015-10-07 (×2): 1 via ORAL
  Filled 2015-10-05 (×3): qty 1

## 2015-10-05 MED ORDER — MULTIVITAMIN & MINERAL PO LIQD: Freq: Every day | ORAL | Status: DC

## 2015-10-05 MED ORDER — AZITHROMYCIN 250 MG PO TABS
250.0000 mg | ORAL_TABLET | Freq: Every day | ORAL | Status: DC
  Administered 2015-10-07: 10:00:00 250 mg via ORAL
  Filled 2015-10-05: qty 1

## 2015-10-05 MED ORDER — GUAIFENESIN ER 600 MG PO TB12
1200.0000 mg | ORAL_TABLET | Freq: Two times a day (BID) | ORAL | Status: DC
  Administered 2015-10-05 – 2015-10-07 (×4): 1200 mg via ORAL
  Filled 2015-10-05 (×4): qty 2

## 2015-10-05 MED ORDER — ENOXAPARIN SODIUM 40 MG/0.4ML ~~LOC~~ SOLN
40.0000 mg | SUBCUTANEOUS | Status: DC
  Administered 2015-10-05 – 2015-10-06 (×2): 40 mg via SUBCUTANEOUS
  Filled 2015-10-05 (×2): qty 0.4

## 2015-10-05 MED ORDER — AZITHROMYCIN 500 MG PO TABS
500.0000 mg | ORAL_TABLET | Freq: Every day | ORAL | Status: AC
  Administered 2015-10-06: 500 mg via ORAL
  Filled 2015-10-05: qty 1

## 2015-10-05 MED ORDER — BUPROPION HCL ER (XL) 300 MG PO TB24
300.0000 mg | ORAL_TABLET | Freq: Every day | ORAL | Status: DC
  Administered 2015-10-06 – 2015-10-07 (×2): 300 mg via ORAL
  Filled 2015-10-05: qty 1
  Filled 2015-10-05: qty 2

## 2015-10-05 MED ORDER — NITROGLYCERIN 0.4 MG SL SUBL: 0.4000 mg | SUBLINGUAL_TABLET | SUBLINGUAL | Status: DC | PRN

## 2015-10-05 MED ORDER — CLOPIDOGREL BISULFATE 75 MG PO TABS
75.0000 mg | ORAL_TABLET | Freq: Every day | ORAL | Status: DC
  Administered 2015-10-06 – 2015-10-07 (×2): 75 mg via ORAL
  Filled 2015-10-05 (×2): qty 1

## 2015-10-05 MED ORDER — DOCUSATE SODIUM 100 MG PO CAPS
100.0000 mg | ORAL_CAPSULE | Freq: Every day | ORAL | Status: DC
  Administered 2015-10-05 – 2015-10-07 (×3): 100 mg via ORAL
  Filled 2015-10-05 (×3): qty 1

## 2015-10-05 NOTE — ED Notes (Signed)
Sob, cough and fever since yesterday. Scheduled to have a heart cath in the am.

## 2015-10-05 NOTE — H&P (Addendum)
History and Physical    Brenda [name]:[id number] DOB: 05/14/1951 DOA: 10/05/2015  Referring MD/NP/PA:   PCP: [name], MD   Outpatient Specialists: Cardiologist, Dr. Ellyn Hack  Patient coming from:  Home  Chief Complaint: Fever, cough, shortness of breath, intermittent chest pain  HPI: Brenda [name] is a 65 y.o. 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  Patient states that he started having cough, fever, shortness of breath yesterday. Her fever has resolved today. She has greenish colored sputum production. Patient states that she has been having intermittent chest pain recently, but no chest pain now. She had abnormal stress test as outpatient, and is scheduled to do cardiac cath Dr. Ellyn Hack tomorrow. Patient does not have nausea, vomiting, diarrhea, abdominal pain, symptoms of UTI or unilateral weakness.  ED Course: pt was found to have WBC 7.5, temperature normal, slightly tachycardia, oxygen saturation 86% on room air, electrolytes and renal function okay, INR 0.92, PTT 35, negative troponin, chest x-ray with chronic interstitial change, no infiltration. Patient is placed on tele bed of observation.  Review of Systems:   General: had fevers, chills, no changes in body weight, has poor appetite, has fatigue HEENT: no blurry vision, hearing changes or sore throat Pulm: has dyspnea, coughing, wheezing CV: had chest pain, no palpitations Abd: no nausea, vomiting, abdominal pain, diarrhea, constipation GU: no dysuria, burning on urination, increased urinary frequency, hematuria  Ext: no leg edema Neuro: no unilateral weakness, numbness,  or tingling, no vision change or hearing loss Skin: no rash MSK: No muscle spasm, no deformity, no limitation of range of movement in spin Heme: No easy bruising.  Travel history: No recent long distant travel.  Allergy:  Allergies  Allergen Reactions  . Tape Hives and Other (See Comments)    USE PAPER TAPE ONLY- Adhesive peels off skin-makes pt. Raw.  . Isosorbide Other (See Comments)    Drops BP too low  . Nicoderm [Nicotine] Rash    To the PATCH only, breakouts on skin    Past Medical History  Diagnosis Date  . History of DVT (deep vein thrombosis)   . Coronary artery disease     multiple stens  . COPD (chronic obstructive pulmonary disease) ([place])   . Transient ischemic attack   . Depression   . Hx of radiation therapy 01/16/09 - 03/06/09    base of tongue, right neck node  . Peripheral arterial disease ([name])     status post bilateral femoropopliteal bypass graft performed by Dr. Sherren Mocha early in the past with significant right lower extremity lifestyle limiting claudication  . Pneumonia   . Hypercholesteremia   . Chronic bronchitis ([place])   . Shortness of breath     "w/exertion and sometimes when laying down" (11/26/2012)  . Type II diabetes mellitus ([place])   . History of blood transfusion 1955  . GERD (gastroesophageal reflux disease)   . H/O hiatal hernia   . Stroke [facility]) 2000    left side weakness remains (11/26/2012)  . Arthritis     "hands" (11/26/2012)  . Cancer of base of tongue ([place]) 11/04/2008    "& lymph nodes @ right neck" (11/26/2012  . Myocardial infarction [facility]) 2005    dr Ellyn Hack   .  Fatigue 08/23/2013  . Carotid artery occlusion     Past Surgical History  Procedure Laterality Date  . Tee without cardioversion  07/26/2011    Procedure: TRANSESOPHAGEAL ECHOCARDIOGRAM (TEE);  Surgeon: Pixie Casino, MD;  Location: [facility] ENDOSCOPY;  Service: Cardiovascular;  Laterality: N/A;  . Esophagogastroduodenoscopy  12/20/2011    Procedure: ESOPHAGOGASTRODUODENOSCOPY  (EGD);  Surgeon: Beryle Beams, MD;  Location: [facility] ENDOSCOPY;  Service: Endoscopy;  Laterality: N/A;  EGD with balloon dilation  . Savory dilation  12/20/2011    Procedure: SAVORY DILATION;  Surgeon: Beryle Beams, MD;  Location: [facility] ENDOSCOPY;  Service: Endoscopy;  Laterality: N/A;  . Tonsillectomy  1969  . Appendectomy  1962  . Cardiac catheterization  1999; 11/26/2012  . Coronary angioplasty with stent placement  1999--2005    "i think I have 3-5 total" (11/26/2012)  . Femoral bypass  1999; 2000    notes 11/26/2012  . Carpal tunnel release Right 1980's?  . Vaginal hysterectomy  1988  . Cesarean section  1978  . Tubal ligation  1988  . Cataract extraction w/ intraocular lens implant Right 2011  . Ankle fracture surgery Right 2007    "got a metal plate and 8 screws in it" (11/26/2012)  . Bypass graft popliteal to popliteal Right 02/05/2013    Procedure: BYPASS GRAFT ABOVE KNEE POPLITEAL TO BELOW KNEE POPLITEAL WITH SMALL SAPHANOUS VEIN;  Surgeon: Rosetta Posner, MD;  Location: [place];  Service: Vascular;  Laterality: Right;  . Eye surgery      Social History:  reports that she has been smoking Cigarettes.  She has a 50 pack-year smoking history. She has never used smokeless tobacco. She reports that she drinks about 3.0 oz of alcohol per week. She reports that she does not use illicit drugs.  Family History:  Family History  Problem Relation Age of Onset  . Cancer Mother     pancreatic  . Diabetes Mother   . Hyperlipidemia Mother   . Hypertension Mother   . Other Mother     varicose vein  . Cancer Father 72    throat  . Heart disease Father   . Heart attack Father   . Cancer Sister     breast  . Diabetes Sister   . Deep vein thrombosis Brother   . Diabetes Brother   . Hearing loss Brother   . Hyperlipidemia Brother   . Hypertension Brother   . Heart attack Brother   . Clotting disorder Brother   . Diabetes Son   . Hyperlipidemia Son      Prior to Admission medications     Medication Sig Start Date End Date Taking? Authorizing Provider  acetaminophen (TYLENOL) 500 MG tablet Take 1,000 mg by mouth daily as needed for moderate pain.     Historical Provider, MD  albuterol (PROVENTIL HFA;VENTOLIN HFA) 108 (90 BASE) MCG/ACT inhaler Inhale 2 puffs into the lungs every 6 (six) hours as needed for wheezing or shortness of breath. 01/20/15   Belkys A Regalado, MD  aspirin EC 81 MG tablet Take 81 mg by mouth daily.    Historical Provider, MD  buPROPion (WELLBUTRIN XL) 300 MG 24 hr tablet Take 300 mg by mouth daily.  09/19/15   Historical Provider, MD  clopidogrel (PLAVIX) 75 MG tablet Take 75 mg by mouth daily.     Historical Provider, MD  docusate sodium (COLACE) 100 MG capsule Take 100 mg by mouth daily.    Historical Provider, MD  fish oil-omega-3  fatty acids 1000 MG capsule Take 1 g by mouth 2 (two) times daily.     Historical Provider, MD  fluticasone (FLONASE) 50 MCG/ACT nasal spray Place 2 sprays into both nostrils daily. 06/02/15   Debbe Odea, MD  fluticasone furoate-vilanterol (BREO ELLIPTA) 100-25 MCG/INH AEPB Inhale 1 puff into the lungs daily. 09/13/15   Tammy S Parrett, NP  guaiFENesin (MUCINEX) 600 MG 12 hr tablet Take 2 tablets (1,200 mg total) by mouth 2 (two) times daily. 06/02/15   Debbe Odea, MD  insulin lispro (HUMALOG) 100 UNIT/ML injection Inject 3 Units into the skin 3 (three) times daily with meals as needed for high blood sugar.     Historical Provider, MD  ipratropium-albuterol (DUONEB) 0.5-2.5 (3) MG/3ML SOLN Take 3 mLs by nebulization every 6 (six) hours. Patient taking differently: Take 3 mLs by nebulization 2 (two) times daily as needed (for wheezingt or shortness of breath).  06/02/15   Debbe Odea, MD  loratadine (CLARITIN) 10 MG tablet Take 1 tablet (10 mg total) by mouth daily. 06/02/15   Debbe Odea, MD  Multiple Vitamins-Minerals (MULTIVITAMIN & MINERAL PO) Take 1 tablet by mouth daily.    Historical Provider, MD  nitroGLYCERIN  (NITROLINGUAL) 0.4 MG/SPRAY spray Place 1 spray under the tongue every 5 (five) minutes as needed for chest pain. 01/20/15   Belkys A Regalado, MD  omeprazole (PRILOSEC) 20 MG capsule Take 20 mg by mouth daily.     Historical Provider, MD  rosuvastatin (CRESTOR) 20 MG tablet Take 20 mg by mouth daily.    Historical Provider, MD  sodium chloride (OCEAN) 0.65 % SOLN nasal spray Place 1 spray into both nostrils as needed for congestion.    Historical Provider, MD  Tiotropium Bromide Monohydrate (SPIRIVA RESPIMAT) 2.5 MCG/ACT AERS Inhale 2 puffs into the lungs daily. 09/13/15   Melvenia Needles, NP    Physical Exam: Filed Vitals:   10/05/15 1700 10/05/15 1730 10/05/15 1924 10/05/15 1925  BP: 138/69 124/75  131/60  Pulse: 95 92  88  Temp:  98.9 F (37.2 C)  97.7 F (36.5 C)  TempSrc:    Oral  Resp: '23 20  18  '$ Height:   '5\' 6"'$  (1.676 m)   Weight:      SpO2: 97% 97%  100%   General: Not in acute distress HEENT:       Eyes: PERRL, EOMI, no scleral icterus.       ENT: No discharge from the ears and nose, no pharynx injection, no tonsillar enlargement.        Neck: No JVD, no bruit, no mass felt. Heme: No neck lymph node enlargement. Cardiac: S1/S2, RRR, No murmurs, No gallops or rubs. Pulm: has rhonchi and wheezing bilaterally. No rales or rubs. Abd: Soft, nondistended, nontender, no rebound pain, no organomegaly, BS present. GU: No hematuria Ext: No pitting leg edema bilaterally. 2+DP/PT pulse bilaterally. Musculoskeletal: No joint deformities, No joint redness or warmth, no limitation of ROM in spin. Skin: No rashes.  Neuro: Alert, oriented X3, cranial nerves II-XII grossly intact, moves all extremities with mild left sided weakness. Psych: Patient is not psychotic, no suicidal or hemocidal ideation.  Labs on Admission: I have personally reviewed following labs and imaging studies  CBC:  Recent Labs Lab 10/02/15 0941 10/05/15 1420  WBC 4.5 7.5  NEUTROABS  --  6.6  HGB 13.6 14.0    HCT 42.0 41.2  MCV 90.5 90.4  PLT 153 130   Basic Metabolic Panel:  Recent Labs  Lab 10/02/15 0942 10/05/15 1420  NA 136 132*  K 4.3 4.2  CL 100 94*  CO2 30 31  GLUCOSE 303* 327*  BUN 10 12  CREATININE 0.69 0.78  CALCIUM 8.9 9.0   GFR: Estimated Creatinine Clearance: 62.2 mL/min (by C-G formula based on Cr of 0.78). Liver Function Tests:  Recent Labs Lab 10/05/15 1420  AST 22  ALT 18  ALKPHOS 135*  BILITOT 1.0  PROT 6.9  ALBUMIN 3.7   No results for input(s): LIPASE, AMYLASE in the last 168 hours. No results for input(s): AMMONIA in the last 168 hours. Coagulation Profile:  Recent Labs Lab 10/05/15 1420  INR 0.92   Cardiac Enzymes:  Recent Labs Lab 10/05/15 1420  TROPONINI <0.03   BNP (last 3 results) No results for input(s): PROBNP in the last 8760 hours. HbA1C: No results for input(s): HGBA1C in the last 72 hours. CBG: No results for input(s): GLUCAP in the last 168 hours. Lipid Profile: No results for input(s): CHOL, HDL, LDLCALC, TRIG, CHOLHDL, LDLDIRECT in the last 72 hours. Thyroid Function Tests: No results for input(s): TSH, T4TOTAL, FREET4, T3FREE, THYROIDAB in the last 72 hours. Anemia Panel: No results for input(s): VITAMINB12, FOLATE, FERRITIN, TIBC, IRON, RETICCTPCT in the last 72 hours. Urine analysis:    Component Value Date/Time   COLORURINE AMBER* 05/28/2015 1529   APPEARANCEUR CLOUDY* 05/28/2015 1529   LABSPEC 1.021 05/28/2015 1529   PHURINE 5.5 05/28/2015 1529   GLUCOSEU >1000* 05/28/2015 1529   HGBUR NEGATIVE 05/28/2015 1529   BILIRUBINUR SMALL* 05/28/2015 1529   KETONESUR NEGATIVE 05/28/2015 1529   PROTEINUR 30* 05/28/2015 1529   UROBILINOGEN 1.0 01/17/2015 1627   NITRITE NEGATIVE 05/28/2015 1529   LEUKOCYTESUR NEGATIVE 05/28/2015 1529   Sepsis Labs: '@LABRCNTIP'$ (procalcitonin:4,lacticidven:4) )No results found for this or any previous visit (from the past 240 hour(s)).   Radiological Exams on Admission: Dg Chest 2  View  10/05/2015  CLINICAL DATA:  Shortness of breath, cough, and fever EXAM: CHEST  2 VIEW COMPARISON:  August 16, 2015 FINDINGS: No pneumothorax. The heart, hila, and mediastinum are unchanged. Increased interstitial/reticular markings are again seen, primarily in the bases, not significantly changed. No confluent infiltrate is identified. No other interval changes. IMPRESSION: Chronic interstitial changes in the lungs with no acute infiltrate identified. Electronically Signed   By: Dorise Bullion III M.D   On: 10/05/2015 14:15     EKG: Independently reviewed.  Not done in ED, will get one.   Assessment/Plan Principal Problem:   Acute on chronic respiratory failure with hypoxia ([name]) Active Problems:   DM (diabetes mellitus), type 2, uncontrolled ([name])   TIA (transient ischemic attack)-2013   History of CVA (cerebrovascular accident)   Tobacco abuse   Hyperlipidemia with target LDL less than 70   PVD-Hx of BFBPG '99 and '00. Dopplers OK 6/13   Claudication in peripheral vascular disease ([place])   Coronary artery disease-multiple stents- last cath 2007- Myoview low risk 4/13   COPD exacerbation ([name])   Positive cardiac stress test   Protein-calorie malnutrition, moderate ([name])   Depression   GERD (gastroesophageal reflux disease)   Acute on chronic respiratory failure with hypoxia due to COPD exacerbation: No infiltration on chest x-ray. No chest pain currently, less likely to have PE.  -will admit patient to telemetry bed for obs -Nebulizers: scheduled Duoneb and prn albuterol -Solu-Medrol 60 mg IV tid -Oral azithromycin for 5 days (one dose of levaquine was given in ED) -Mucinex for cough  -Urine S. pneumococcal antigen -  Follow up blood culture x2, sputum culture, respiratory virus panel, Flu pcr  Coronary artery disease-multiple stents- last cath 2007: pt has intermittent chest pain recently, but no chest pain on admission. Patient is scheduled for cardiac catheter tomorrow by  Dr. Ellyn Hack. -Continue aspirin, Crestor and Plavix -prn NTG -Troponin 3 -Please inform Dr. Ellyn Hack that the patient is admitted in AM  DM-II: Last A1c 8.8, poorly controled. Patient is taking humalog at home -SSI -Check A1c  HLD: Last LDL was 40 on 08/17/14 -Continue home medications: Crestor -Check FLP  Hx of TIA and stroke:  -continue aspirin, Plavix and Crestor  Tobacco abuse: -did counseling about the importance of quitting smoking -Patient refused nicotine patch due to allergy  Protein-calorie malnutrition, moderate ([place]) -consult to nutrition  GERD: -Protonix  Depression: Stable, no suicidal or homicidal ideations. -Continue home medications: Wellbutrin  DVT ppx: sQ Heparin (if pt develops severe chest pain or significantly elevated trop, will be easier to switch to IV heparin or stop heparin for procedure than using Lovenox).   Code Status: Full code Family Communication:  Yes, patient's husband at bed side Disposition Plan:  Anticipate discharge back to previous home environment Consults called:  none Admission status: Obs / tele  Inpatient/tele   medical floor/obs     SDU/inpation       Date of Service 10/05/2015    Ivor Costa [facility] Pager [phone]  If 7PM-7AM, please contact night-coverage [email or web address] Password [email or web address] 10/05/2015, 10:51 PM

## 2015-10-05 NOTE — ED Notes (Signed)
[facility]--is aware of bed [id number] @ [facility]

## 2015-10-05 NOTE — ED Notes (Signed)
Walked Pt. To restroom.  Pt. Very short of breath without oxygen sats drop to 81% off oxygen.

## 2015-10-05 NOTE — ED Provider Notes (Signed)
CSN: [id number]     Arrival date & time 10/05/15  1331 History   First MD Initiated Contact with Patient 10/05/15 1344     Chief Complaint  Patient presents with  . Shortness of Breath     (Consider location/radiation/quality/duration/timing/severity/associated sxs/prior Treatment) HPI Comments: Patient presents with shortness of breath, cough and fever since last night. Reports temperature to 101. She endorses body aches and chills. Cough is productive of clear mucus. She has COPD and wears oxygen at night only. She is a history of CAD and peripheral vascular disease. She is scheduled to have a catheterization tomorrow due to abnormal stress test that she had a few weeks ago. She's been having intermittent chest pain and pressure and burning for several months. She denies any chest pain currently. She endorses body aches and chills. She is on Plavix but no other anticoagulation. Denies any abdominal pain, nausea or vomiting. No diarrhea. No focal weakness, numbness or tingling. She did receive a flu shot. She has had sick contacts at home.  The history is provided by the patient and the spouse.    Past Medical History  Diagnosis Date  . History of DVT (deep vein thrombosis)   . Coronary artery disease     multiple stens  . COPD (chronic obstructive pulmonary disease) ([place])   . Transient ischemic attack   . Depression   . Hx of radiation therapy 01/16/09 - 03/06/09    base of tongue, right neck node  . Peripheral arterial disease ([name])     status post bilateral femoropopliteal bypass graft performed by Dr. Sherren Mocha early in the past with significant right lower extremity lifestyle limiting claudication  . Pneumonia   . Hypercholesteremia   . Chronic bronchitis ([place])   . Shortness of breath     "w/exertion and sometimes when laying down" (11/26/2012)  . Type II diabetes mellitus ([place])   . History of blood transfusion 1955  . GERD (gastroesophageal reflux disease)   . H/O hiatal hernia   .  Stroke [facility]) 2000    left side weakness remains (11/26/2012)  . Arthritis     "hands" (11/26/2012)  . Cancer of base of tongue ([place]) 11/04/2008    "& lymph nodes @ right neck" (11/26/2012  . Myocardial infarction [facility]) 2005    dr Ellyn Hack   . Fatigue 08/23/2013  . Carotid artery occlusion    Past Surgical History  Procedure Laterality Date  . Tee without cardioversion  07/26/2011    Procedure: TRANSESOPHAGEAL ECHOCARDIOGRAM (TEE);  Surgeon: Pixie Casino, MD;  Location: [facility] ENDOSCOPY;  Service: Cardiovascular;  Laterality: N/A;  . Esophagogastroduodenoscopy  12/20/2011    Procedure: ESOPHAGOGASTRODUODENOSCOPY (EGD);  Surgeon: Beryle Beams, MD;  Location: [facility] ENDOSCOPY;  Service: Endoscopy;  Laterality: N/A;  EGD with balloon dilation  . Savory dilation  12/20/2011    Procedure: SAVORY DILATION;  Surgeon: Beryle Beams, MD;  Location: [facility] ENDOSCOPY;  Service: Endoscopy;  Laterality: N/A;  . Tonsillectomy  1969  . Appendectomy  1962  . Cardiac catheterization  1999; 11/26/2012  . Coronary angioplasty with stent placement  1999--2005    "i think I have 3-5 total" (11/26/2012)  . Femoral bypass  1999; 2000    notes 11/26/2012  . Carpal tunnel release Right 1980's?  . Vaginal hysterectomy  1988  . Cesarean section  1978  . Tubal ligation  1988  . Cataract extraction w/ intraocular lens implant Right 2011  . Ankle fracture surgery Right 2007    "  got a metal plate and 8 screws in it" (11/26/2012)  . Bypass graft popliteal to popliteal Right 02/05/2013    Procedure: BYPASS GRAFT ABOVE KNEE POPLITEAL TO BELOW KNEE POPLITEAL WITH SMALL SAPHANOUS VEIN;  Surgeon: Rosetta Posner, MD;  Location: [facility] OR;  Service: Vascular;  Laterality: Right;  . Eye surgery     Family History  Problem Relation Age of Onset  . Cancer Mother     pancreatic  . Diabetes Mother   . Hyperlipidemia Mother   . Hypertension Mother   . Other Mother     varicose vein  . Cancer Father 72    throat  . Heart disease Father   .  Heart attack Father   . Cancer Sister     breast  . Diabetes Sister   . Deep vein thrombosis Brother   . Diabetes Brother   . Hearing loss Brother   . Hyperlipidemia Brother   . Hypertension Brother   . Heart attack Brother   . Clotting disorder Brother   . Diabetes Son   . Hyperlipidemia Son    Social History  Substance Use Topics  . Smoking status: Light Tobacco Smoker -- 1.00 packs/day for 50 years    Types: Cigarettes  . Smokeless tobacco: Never Used     Comment: down to .5ppd per pt 08/16/15  . Alcohol Use: 3.0 oz/week    5 Glasses of wine per week   OB History    No data available     Review of Systems  Constitutional: Positive for chills, activity change, appetite change and fatigue.  HENT: Positive for congestion and rhinorrhea.   Eyes: Negative for visual disturbance.  Respiratory: Positive for cough, chest tightness and shortness of breath.   Cardiovascular: Negative for chest pain.  Gastrointestinal: Negative for nausea, vomiting and abdominal pain.  Genitourinary: Negative for dysuria, hematuria, vaginal bleeding and vaginal discharge.  Musculoskeletal: Negative for myalgias and arthralgias.  Skin: Negative for rash.  Neurological: Positive for weakness. Negative for dizziness and headaches.  A complete 10 system review of systems was obtained and all systems are negative except as noted in the HPI and PMH.      Allergies  Tape; Isosorbide; and Nicoderm  Home Medications   Prior to Admission medications   Medication Sig Start Date End Date Taking? Authorizing Provider  acetaminophen (TYLENOL) 500 MG tablet Take 1,000 mg by mouth daily as needed for moderate pain.     Historical Provider, MD  albuterol (PROVENTIL HFA;VENTOLIN HFA) 108 (90 BASE) MCG/ACT inhaler Inhale 2 puffs into the lungs every 6 (six) hours as needed for wheezing or shortness of breath. 01/20/15   Belkys A Regalado, MD  aspirin EC 81 MG tablet Take 81 mg by mouth daily.    Historical  Provider, MD  buPROPion (WELLBUTRIN XL) 300 MG 24 hr tablet Take 300 mg by mouth daily.  09/19/15   Historical Provider, MD  clopidogrel (PLAVIX) 75 MG tablet Take 75 mg by mouth daily.     Historical Provider, MD  docusate sodium (COLACE) 100 MG capsule Take 100 mg by mouth daily.    Historical Provider, MD  fish oil-omega-3 fatty acids 1000 MG capsule Take 1 g by mouth 2 (two) times daily.     Historical Provider, MD  fluticasone (FLONASE) 50 MCG/ACT nasal spray Place 2 sprays into both nostrils daily. 06/02/15   Debbe Odea, MD  fluticasone furoate-vilanterol (BREO ELLIPTA) 100-25 MCG/INH AEPB Inhale 1 puff into the lungs daily. 09/13/15  Tammy S Parrett, NP  guaiFENesin (MUCINEX) 600 MG 12 hr tablet Take 2 tablets (1,200 mg total) by mouth 2 (two) times daily. 06/02/15   Debbe Odea, MD  insulin lispro (HUMALOG) 100 UNIT/ML injection Inject 3 Units into the skin 3 (three) times daily with meals as needed for high blood sugar.     Historical Provider, MD  ipratropium-albuterol (DUONEB) 0.5-2.5 (3) MG/3ML SOLN Take 3 mLs by nebulization every 6 (six) hours. Patient taking differently: Take 3 mLs by nebulization 2 (two) times daily as needed (for wheezingt or shortness of breath).  06/02/15   Debbe Odea, MD  loratadine (CLARITIN) 10 MG tablet Take 1 tablet (10 mg total) by mouth daily. 06/02/15   Debbe Odea, MD  Multiple Vitamins-Minerals (MULTIVITAMIN & MINERAL PO) Take 1 tablet by mouth daily.    Historical Provider, MD  nitroGLYCERIN (NITROLINGUAL) 0.4 MG/SPRAY spray Place 1 spray under the tongue every 5 (five) minutes as needed for chest pain. 01/20/15   Belkys A Regalado, MD  omeprazole (PRILOSEC) 20 MG capsule Take 20 mg by mouth daily.     Historical Provider, MD  rosuvastatin (CRESTOR) 20 MG tablet Take 20 mg by mouth daily.    Historical Provider, MD  sodium chloride (OCEAN) 0.65 % SOLN nasal spray Place 1 spray into both nostrils as needed for congestion.    Historical Provider, MD   Tiotropium Bromide Monohydrate (SPIRIVA RESPIMAT) 2.5 MCG/ACT AERS Inhale 2 puffs into the lungs daily. 09/13/15   Tammy S Parrett, NP   BP 154/74 mmHg  Pulse 98  Temp(Src) 99 F (37.2 C) (Oral)  Resp 16  Ht '5\' 6"'$  (1.676 m)  Wt 124 lb (56.246 kg)  BMI 20.02 kg/m2  SpO2 100% Physical Exam  Constitutional: She is oriented to person, place, and time. She appears well-developed and well-nourished. No distress.  HENT:  Head: Normocephalic and atraumatic.  Mouth/Throat: Oropharynx is clear and moist. No oropharyngeal exudate.  Dry mucus membrane  Eyes: Conjunctivae and EOM are normal. Pupils are equal, round, and reactive to light.  Neck: Normal range of motion. Neck supple.  No meningismus.  Cardiovascular: Normal rate, regular rhythm, normal heart sounds and intact distal pulses.   No murmur heard. Pulmonary/Chest: She is in respiratory distress. She has wheezes.  Scattered respiratory wheezing throughout with decreased breath sounds at the bases  Abdominal: Soft. There is no tenderness. There is no rebound and no guarding.  Musculoskeletal: Normal range of motion. She exhibits no edema or tenderness.  No peripheral edema. Difficult to palpate DP and PT pulses  Neurological: She is alert and oriented to person, place, and time. No cranial nerve deficit. She exhibits normal muscle tone. Coordination normal.  No ataxia on finger to nose bilaterally. No pronator drift. 5/5 strength throughout. CN 2-12 intact.Equal grip strength. Sensation intact.   Skin: Skin is warm.  Psychiatric: She has a normal mood and affect. Her behavior is normal.  Nursing note and vitals reviewed.   ED Course  Procedures (including critical care time) Labs Review Labs Reviewed  CBC WITH DIFFERENTIAL/PLATELET - Abnormal; Notable for the following:    Lymphs Abs 0.4 (*)    All other components within normal limits  COMPREHENSIVE METABOLIC PANEL - Abnormal; Notable for the following:    Sodium 132 (*)     Chloride 94 (*)    Glucose, Bld 327 (*)    Alkaline Phosphatase 135 (*)    All other components within normal limits  TROPONIN I  PROTIME-INR  Imaging Review Dg Chest 2 View  10/05/2015  CLINICAL DATA:  Shortness of breath, cough, and fever EXAM: CHEST  2 VIEW COMPARISON:  August 16, 2015 FINDINGS: No pneumothorax. The heart, hila, and mediastinum are unchanged. Increased interstitial/reticular markings are again seen, primarily in the bases, not significantly changed. No confluent infiltrate is identified. No other interval changes. IMPRESSION: Chronic interstitial changes in the lungs with no acute infiltrate identified. Electronically Signed   By: Dorise Bullion III M.D   On: 10/05/2015 14:15   I have personally reviewed and evaluated these images and lab results as part of my medical decision-making.   EKG Interpretation   Date/Time:  Thursday Oct 05 2015 13:49:33 EDT Ventricular Rate:  98 PR Interval:  189 QRS Duration: 86 QT Interval:  345 QTC Calculation: 440 R Axis:   70 Text Interpretation:  Sinus rhythm Probable left atrial enlargement  Anteroseptal infarct, age indeterminate Abnrm T, consider ischemia,  anterolateral lds Baseline wander in lead(s) V2 new lateral T wave  inversions and ST depressions Confirmed by Wyvonnia Dusky  MD, Karenna Romanoff [phone]) on  10/05/2015 2:04:41 PM      MDM   Final diagnoses:  COPD exacerbation ([place])  EKG abnormality   Patient with shortness of breath cough and fever since yesterday. Hypoxic on room air on arrival but saturations 100% on 2 L. Rhonchi and wheezing throughout.  EKG shows new T-wave inversions   and laterally. Nebs and steroids for apparent COPD exacerbation. CXR without infiltrate. Troponin negative. ASA given.  Increased O2 requirement, no dyspnea at rest.   Plan admission for COPD exacerbation, abnormal EKG, scheduled for cath tomorrow.  Admission d/w Dr. Waldron Labs.  Ezequiel Essex, MD 10/05/15 [phone]

## 2015-10-05 NOTE — Telephone Encounter (Signed)
Returned call to patient.She stated she had a fever last night.No fever this morning.Stated she feels like she did when she had pneumonia.She has a productive cough,coughing yellow phlegm,wheezing,dizzy.Stated she feels bad,weak,no energy.Stated PCP unable to see her this morning.Advised to go to a Urgent Care.Stated cardiac cath scheduled tomorrow 10/06/15,wanted to know if ok to still have done.Advised I will send message to [place] for advice.

## 2015-10-05 NOTE — Telephone Encounter (Signed)
Left message to see how the patient was doing on voice mail Informed patient may call the on call provider- if she decide not have cath tomorrow.

## 2015-10-05 NOTE — Telephone Encounter (Signed)
New Message  Pt called states that she has a fever and asks if she should follow through with the CATH. Please call back to discuss

## 2015-10-05 NOTE — Progress Notes (Signed)
Patient presents with cough, fever, shortness of breath, appears to be in COPD exacerbation, chest x-ray with no opacity or infiltrate, started on IV Solu-Medrol, and antibiotics, accepted to telemetry. - As well patient reports occasional chest pain, but nothing recent, abnormal stress test as an outpatient, supposed to have a cardiac cath by Dr. Ellyn Hack tomorrow, has negative troponin, but some EKG changes, so cardiology will need to be consulted on admission. Phillips Climes MD

## 2015-10-06 ENCOUNTER — Encounter (HOSPITAL_COMMUNITY)
Admission: EM | Disposition: A | Discharge status: Home / Self Care | Payer: Self-pay | Source: Home / Self Care | Attending: Internal Medicine

## 2015-10-06 ENCOUNTER — Other Ambulatory Visit: Payer: Self-pay

## 2015-10-06 ENCOUNTER — Ambulatory Visit (HOSPITAL_COMMUNITY): Admission: RE | Admit: 2015-10-06 | Payer: Medicare Other | Source: Ambulatory Visit | Admitting: Cardiology

## 2015-10-06 DIAGNOSIS — J441 Chronic obstructive pulmonary disease with (acute) exacerbation: Secondary | ICD-10-CM | POA: Diagnosis present

## 2015-10-06 DIAGNOSIS — I25110 Atherosclerotic heart disease of native coronary artery with unstable angina pectoris: Principal | ICD-10-CM

## 2015-10-06 DIAGNOSIS — I209 Angina pectoris, unspecified: Secondary | ICD-10-CM | POA: Diagnosis not present

## 2015-10-06 DIAGNOSIS — E440 Moderate protein-calorie malnutrition: Secondary | ICD-10-CM | POA: Diagnosis present

## 2015-10-06 DIAGNOSIS — Z85810 Personal history of malignant neoplasm of tongue: Secondary | ICD-10-CM | POA: Diagnosis not present

## 2015-10-06 DIAGNOSIS — Z8673 Personal history of transient ischemic attack (TIA), and cerebral infarction without residual deficits: Secondary | ICD-10-CM | POA: Diagnosis not present

## 2015-10-06 DIAGNOSIS — J9621 Acute and chronic respiratory failure with hypoxia: Secondary | ICD-10-CM | POA: Diagnosis present

## 2015-10-06 DIAGNOSIS — R0602 Shortness of breath: Secondary | ICD-10-CM | POA: Diagnosis present

## 2015-10-06 DIAGNOSIS — Z9842 Cataract extraction status, left eye: Secondary | ICD-10-CM | POA: Diagnosis not present

## 2015-10-06 DIAGNOSIS — E1151 Type 2 diabetes mellitus with diabetic peripheral angiopathy without gangrene: Secondary | ICD-10-CM | POA: Diagnosis present

## 2015-10-06 DIAGNOSIS — I252 Old myocardial infarction: Secondary | ICD-10-CM | POA: Diagnosis not present

## 2015-10-06 DIAGNOSIS — F17210 Nicotine dependence, cigarettes, uncomplicated: Secondary | ICD-10-CM | POA: Diagnosis present

## 2015-10-06 DIAGNOSIS — J069 Acute upper respiratory infection, unspecified: Secondary | ICD-10-CM | POA: Diagnosis present

## 2015-10-06 DIAGNOSIS — R9439 Abnormal result of other cardiovascular function study: Secondary | ICD-10-CM | POA: Diagnosis not present

## 2015-10-06 DIAGNOSIS — J209 Acute bronchitis, unspecified: Secondary | ICD-10-CM

## 2015-10-06 DIAGNOSIS — Z86718 Personal history of other venous thrombosis and embolism: Secondary | ICD-10-CM | POA: Diagnosis not present

## 2015-10-06 DIAGNOSIS — I2510 Atherosclerotic heart disease of native coronary artery without angina pectoris: Secondary | ICD-10-CM

## 2015-10-06 DIAGNOSIS — T82855A Stenosis of coronary artery stent, initial encounter: Secondary | ICD-10-CM | POA: Diagnosis present

## 2015-10-06 DIAGNOSIS — Z961 Presence of intraocular lens: Secondary | ICD-10-CM | POA: Diagnosis present

## 2015-10-06 DIAGNOSIS — Z9861 Coronary angioplasty status: Secondary | ICD-10-CM

## 2015-10-06 DIAGNOSIS — Z7982 Long term (current) use of aspirin: Secondary | ICD-10-CM | POA: Diagnosis not present

## 2015-10-06 DIAGNOSIS — K219 Gastro-esophageal reflux disease without esophagitis: Secondary | ICD-10-CM | POA: Diagnosis present

## 2015-10-06 DIAGNOSIS — E785 Hyperlipidemia, unspecified: Secondary | ICD-10-CM | POA: Diagnosis present

## 2015-10-06 DIAGNOSIS — E1165 Type 2 diabetes mellitus with hyperglycemia: Secondary | ICD-10-CM | POA: Diagnosis present

## 2015-10-06 DIAGNOSIS — Z9841 Cataract extraction status, right eye: Secondary | ICD-10-CM | POA: Diagnosis not present

## 2015-10-06 DIAGNOSIS — Z794 Long term (current) use of insulin: Secondary | ICD-10-CM | POA: Diagnosis not present

## 2015-10-06 DIAGNOSIS — F329 Major depressive disorder, single episode, unspecified: Secondary | ICD-10-CM | POA: Diagnosis present

## 2015-10-06 DIAGNOSIS — Z923 Personal history of irradiation: Secondary | ICD-10-CM | POA: Diagnosis not present

## 2015-10-06 DIAGNOSIS — Z681 Body mass index (BMI) 19 or less, adult: Secondary | ICD-10-CM | POA: Diagnosis not present

## 2015-10-06 DIAGNOSIS — Z7902 Long term (current) use of antithrombotics/antiplatelets: Secondary | ICD-10-CM | POA: Diagnosis not present

## 2015-10-06 HISTORY — PX: CARDIAC CATHETERIZATION: SHX172

## 2015-10-06 LAB — LIPID PANEL
CHOLESTEROL: 109 mg/dL (ref 0–200)
HDL: 65 mg/dL (ref >40)
LDL Cholesterol: 36 mg/dL (ref 0–99)
Total CHOL/HDL Ratio: 1.7 RATIO
Triglycerides: 42 mg/dL (ref <150)
VLDL: 8 mg/dL (ref 0–40)

## 2015-10-06 LAB — TROPONIN I: TROPONIN I: 0.03 ng/mL (ref <0.031)

## 2015-10-06 LAB — GLUCOSE, CAPILLARY
GLUCOSE-CAPILLARY: 196 mg/dL — AB (ref 65–99)
GLUCOSE-CAPILLARY: 305 mg/dL — AB (ref 65–99)
GLUCOSE-CAPILLARY: 240 mg/dL — AB (ref 65–99)
Glucose-Capillary: 166 mg/dL — ABNORMAL HIGH (ref 65–99)

## 2015-10-06 LAB — RESPIRATORY PANEL BY PCR
Adenovirus: NOT DETECTED
BORDETELLA PERTUSSIS-RVPCR: NOT DETECTED
CHLAMYDOPHILA PNEUMONIAE-RVPPCR: NOT DETECTED
CORONAVIRUS 229E-RVPPCR: NOT DETECTED
CORONAVIRUS OC43-RVPPCR: NOT DETECTED
Coronavirus HKU1: NOT DETECTED
Coronavirus NL63: NOT DETECTED
INFLUENZA A H1-RVPPCR: NOT DETECTED
INFLUENZA A-RVPPCR: NOT DETECTED
Influenza A H1 2009: NOT DETECTED
Influenza A H3: NOT DETECTED
Influenza B: NOT DETECTED
Metapneumovirus: NOT DETECTED
Mycoplasma pneumoniae: NOT DETECTED
PARAINFLUENZA VIRUS 1-RVPPCR: NOT DETECTED
PARAINFLUENZA VIRUS 4-RVPPCR: NOT DETECTED
Parainfluenza Virus 2: NOT DETECTED
Parainfluenza Virus 3: NOT DETECTED
RESPIRATORY SYNCYTIAL VIRUS-RVPPCR: NOT DETECTED
Rhinovirus / Enterovirus: NOT DETECTED

## 2015-10-06 LAB — STREP PNEUMONIAE URINARY ANTIGEN: STREP PNEUMO URINARY ANTIGEN: POSITIVE — AB

## 2015-10-06 LAB — POCT ACTIVATED CLOTTING TIME
ACTIVATED CLOTTING TIME: 276 s
Activated Clotting Time: 260 seconds
Activated Clotting Time: 260 seconds

## 2015-10-06 LAB — INFLUENZA PANEL BY PCR (TYPE A & B)
H1N1FLUPCR: NOT DETECTED
INFLAPCR: NEGATIVE
INFLBPCR: NEGATIVE

## 2015-10-06 LAB — MRSA PCR SCREENING: MRSA BY PCR: POSITIVE — AB

## 2015-10-06 SURGERY — LEFT HEART CATH AND CORONARY ANGIOGRAPHY
Anesthesia: LOCAL

## 2015-10-06 MED ORDER — IPRATROPIUM-ALBUTEROL 0.5-2.5 (3) MG/3ML IN SOLN: 3.0000 mL | RESPIRATORY_TRACT | Status: DC | PRN

## 2015-10-06 MED ORDER — LIDOCAINE HCL (PF) 1 % IJ SOLN
INTRAMUSCULAR | Status: DC | PRN
  Administered 2015-10-06: 3 mL

## 2015-10-06 MED ORDER — IOPAMIDOL (ISOVUE-370) INJECTION 76%
INTRAVENOUS | Status: AC
  Filled 2015-10-06: qty 50

## 2015-10-06 MED ORDER — IOPAMIDOL (ISOVUE-370) INJECTION 76%
INTRAVENOUS | Status: AC
  Filled 2015-10-06: qty 100

## 2015-10-06 MED ORDER — CHLORHEXIDINE GLUCONATE CLOTH 2 % EX PADS
6.0000 | MEDICATED_PAD | Freq: Every day | CUTANEOUS | Status: DC
  Administered 2015-10-06: 6 via TOPICAL

## 2015-10-06 MED ORDER — HEPARIN SODIUM (PORCINE) 1000 UNIT/ML IJ SOLN
INTRAMUSCULAR | Status: AC
  Filled 2015-10-06: qty 1

## 2015-10-06 MED ORDER — NITROGLYCERIN 1 MG/10 ML FOR IR/CATH LAB
INTRA_ARTERIAL | Status: DC | PRN
  Administered 2015-10-06 (×2): 200 ug via INTRACORONARY

## 2015-10-06 MED ORDER — MIDAZOLAM HCL 2 MG/2ML IJ SOLN
INTRAMUSCULAR | Status: AC
  Filled 2015-10-06: qty 2

## 2015-10-06 MED ORDER — SODIUM CHLORIDE 0.9 % IV BOLUS (SEPSIS)
250.0000 mL | Freq: Once | INTRAVENOUS | Status: AC
Start: 2015-10-06 — End: 2015-10-06
  Administered 2015-10-06: 250 mL via INTRAVENOUS

## 2015-10-06 MED ORDER — ONDANSETRON HCL 4 MG/2ML IJ SOLN: 4.0000 mg | Freq: Four times a day (QID) | INTRAMUSCULAR | Status: DC | PRN

## 2015-10-06 MED ORDER — LIDOCAINE HCL (PF) 1 % IJ SOLN
INTRAMUSCULAR | Status: AC
  Filled 2015-10-06: qty 30

## 2015-10-06 MED ORDER — SODIUM CHLORIDE 0.9 % IV SOLN
INTRAVENOUS | Status: DC
  Administered 2015-10-06: 21:00:00 via INTRAVENOUS

## 2015-10-06 MED ORDER — FENTANYL CITRATE (PF) 100 MCG/2ML IJ SOLN
INTRAMUSCULAR | Status: AC
  Filled 2015-10-06: qty 2

## 2015-10-06 MED ORDER — SODIUM CHLORIDE 0.9 % IV SOLN
INTRAVENOUS | Status: DC | PRN
  Administered 2015-10-06: 10 mL/h via INTRAVENOUS

## 2015-10-06 MED ORDER — PREDNISONE 20 MG PO TABS
40.0000 mg | ORAL_TABLET | Freq: Every day | ORAL | Status: DC
  Administered 2015-10-07: 08:00:00 40 mg via ORAL
  Filled 2015-10-06: qty 2

## 2015-10-06 MED ORDER — IOPAMIDOL (ISOVUE-370) INJECTION 76%
INTRAVENOUS | Status: DC | PRN
  Administered 2015-10-06: 235 mL via INTRAVENOUS

## 2015-10-06 MED ORDER — MIDAZOLAM HCL 2 MG/2ML IJ SOLN
INTRAMUSCULAR | Status: DC | PRN
  Administered 2015-10-06 (×2): 1 mg via INTRAVENOUS

## 2015-10-06 MED ORDER — MUPIROCIN 2 % EX OINT
1.0000 "application " | TOPICAL_OINTMENT | Freq: Two times a day (BID) | CUTANEOUS | Status: DC
  Administered 2015-10-06 – 2015-10-07 (×2): 1 via NASAL
  Filled 2015-10-06 (×2): qty 22

## 2015-10-06 MED ORDER — SODIUM CHLORIDE 0.9 % WEIGHT BASED INFUSION: 3.0000 mL/kg/h | INTRAVENOUS | Status: DC

## 2015-10-06 MED ORDER — SODIUM CHLORIDE 0.9 % IV SOLN: 250.0000 mL | INTRAVENOUS | Status: DC | PRN

## 2015-10-06 MED ORDER — VERAPAMIL HCL 2.5 MG/ML IV SOLN
INTRAVENOUS | Status: AC
  Filled 2015-10-06: qty 2

## 2015-10-06 MED ORDER — VERAPAMIL HCL 2.5 MG/ML IV SOLN
INTRAVENOUS | Status: DC | PRN
  Administered 2015-10-06: 10 mL via INTRA_ARTERIAL

## 2015-10-06 MED ORDER — HEPARIN (PORCINE) IN NACL 2-0.9 UNIT/ML-% IJ SOLN
INTRAMUSCULAR | Status: AC
  Filled 2015-10-06: qty 1000

## 2015-10-06 MED ORDER — FENTANYL CITRATE (PF) 100 MCG/2ML IJ SOLN
INTRAMUSCULAR | Status: DC | PRN
  Administered 2015-10-06: 25 ug via INTRAVENOUS

## 2015-10-06 MED ORDER — ANGIOPLASTY BOOK
Freq: Once | Status: AC
  Administered 2015-10-06: 22:00:00
  Filled 2015-10-06: qty 1

## 2015-10-06 MED ORDER — SODIUM CHLORIDE 0.9% FLUSH
3.0000 mL | Freq: Two times a day (BID) | INTRAVENOUS | Status: DC
  Administered 2015-10-06: 3 mL via INTRAVENOUS

## 2015-10-06 MED ORDER — SODIUM CHLORIDE 0.9% FLUSH: 3.0000 mL | INTRAVENOUS | Status: DC | PRN

## 2015-10-06 MED ORDER — NITROGLYCERIN 1 MG/10 ML FOR IR/CATH LAB
INTRA_ARTERIAL | Status: DC | PRN
  Administered 2015-10-06: 200 ug

## 2015-10-06 MED ORDER — GLUCERNA SHAKE PO LIQD
237.0000 mL | Freq: Two times a day (BID) | ORAL | Status: DC
  Administered 2015-10-06 – 2015-10-07 (×2): 237 mL via ORAL
  Filled 2015-10-06 (×5): qty 237

## 2015-10-06 MED ORDER — HEPARIN SODIUM (PORCINE) 1000 UNIT/ML IJ SOLN
INTRAMUSCULAR | Status: DC | PRN
  Administered 2015-10-06: 2500 [IU] via INTRAVENOUS
  Administered 2015-10-06: 1000 [IU] via INTRAVENOUS
  Administered 2015-10-06: 3000 [IU] via INTRAVENOUS

## 2015-10-06 MED ORDER — HEPARIN (PORCINE) IN NACL 2-0.9 UNIT/ML-% IJ SOLN
INTRAMUSCULAR | Status: DC | PRN
Start: 2015-10-06 — End: 2015-10-06
  Administered 2015-10-06: 1000 mL

## 2015-10-06 MED ORDER — NITROGLYCERIN 1 MG/10 ML FOR IR/CATH LAB
INTRA_ARTERIAL | Status: AC
  Filled 2015-10-06: qty 10

## 2015-10-06 SURGICAL SUPPLY — 26 items
BALLN ANGIOSCULPT RX 2.0X10 (BALLOONS) ×2
BALLN EUPHORA RX 2.0X12 (BALLOONS) ×2
BALLN EUPHORA RX 2.5X15 (BALLOONS) ×2
BALLN ~~LOC~~ EUPHORA RX 3.0X15 (BALLOONS) ×2
BALLOON ANGIOSCULPT RX 2.0X10 (BALLOONS) IMPLANT
BALLOON EUPHORA RX 2.0X12 (BALLOONS) IMPLANT
BALLOON EUPHORA RX 2.5X15 (BALLOONS) IMPLANT
BALLOON ~~LOC~~ EUPHORA RX 3.0X15 (BALLOONS) IMPLANT
CATH OPTITORQUE TIG 4.0 5F (CATHETERS) ×1 IMPLANT
CATH VISTA GUIDE 6FR XBLAD3.5 (CATHETERS) ×1 IMPLANT
DEVICE RAD COMP TR BAND LRG (VASCULAR PRODUCTS) ×1 IMPLANT
DEVICE TORQUE .014-.018 (MISCELLANEOUS) IMPLANT
GLIDESHEATH SLEND A-KIT 6F 22G (SHEATH) ×1 IMPLANT
GUIDE CATH RUNWAY 6FR FR4 (CATHETERS) ×1 IMPLANT
KIT ENCORE 26 ADVANTAGE (KITS) ×1 IMPLANT
KIT ESSENTIALS PG (KITS) ×1 IMPLANT
KIT HEART LEFT (KITS) ×2 IMPLANT
PACK CARDIAC CATHETERIZATION (CUSTOM PROCEDURE TRAY) ×2 IMPLANT
STENT PROMUS PREM MR 2.75X38 (Permanent Stent) ×1 IMPLANT
TORQUE DEVICE .014-.018 (MISCELLANEOUS) ×2
TRANSDUCER W/STOPCOCK (MISCELLANEOUS) ×2 IMPLANT
TUBING CIL FLEX 10 FLL-RA (TUBING) ×2 IMPLANT
WIRE HI TORQ WHISPER MS 190CM (WIRE) ×2 IMPLANT
WIRE LUGE 182CM (WIRE) ×1 IMPLANT
WIRE RUNTHROUGH .014X180CM (WIRE) ×1 IMPLANT
WIRE SAFE-T 1.5MM-J .035X260CM (WIRE) ×1 IMPLANT

## 2015-10-06 NOTE — Progress Notes (Addendum)
PROGRESS NOTE                                                                                                                                                                                                             Patient Demographics:    Brenda [name], is a 65 y.o. female, DOB - [birth year], MBE:[id number]  Admit date - 10/05/2015   Admitting Physician Ivor Costa, MD  Outpatient Primary MD for the patient is [name], MD  LOS - 1  Outpatient Specialists: Glenetta Hew :cardiology  Chief Complaint  Patient presents with  . Shortness of Breath       Brief Narrative   65 year old female with history of COPD with ongoing tobacco use, (on 2 L. O2 at night),  diabetes mellitus, GERD, coronary artery disease with prior stent placement, PAD to disclose femoral bypass, DVT not on antibiotics, TIA, stroke with left-sided weakness, cancer of the base of the tongue (status post radiation), [place] artery occlusion, GERD, depression presented with fever of 101F on nonproductive cough and increased shortness of breath with wheezing. Patient reports onset of symptoms on the day prior to admission and went to urgent care where they did an EKG and found to be somewhat abnormal and sent her to the ED In the ED she was mildly tachycardic with O2 sat of 86% on room air, normal CBC, electrolytes and troponin. Chest x-ray shows chronic interstitial changes. Patient admitted for acute URI symptoms. Patient recently had a Myoview done and was abnormal with reversible defect in the distal anterior wall, apex and distal inferior wall suggestive of severe ischemia in the LAD distribution and was scheduled for elective cath on 5/5 Patient seen by cardiology and underwent left heart catheterization showing severe LAD stenosis with moderate in-stent restenosis. A single DES stent was placed. Also severe ostial to proximal RPDA stenosis was treated with  PTCA.  Subjective Patient seen after returning from cardiac cath. Reports her breathing to be much better. Denies cough at this time   Assessment  & Plan :    Principal Problem:   Acute on chronic respiratory failure with hypoxia ([place]) Secondary to URI symptoms versus mild COPD exacerbation. Symptoms much improved maintaining O2 sat on room air. Transition to oral prednisone. Continue when necessary DuoNeb, home inhaler and Z-Pak. Supportive care with Tylenol and antitussives.  Active  Problems: Coronary artery disease with severe LAD lesion Status post cardiac cath with DES and PTCA. See above. on aspirin and Plavix which is continued. Continue statin.    DM (diabetes mellitus), type 2, uncontrolled ([place]) Poorly controlled. Last A1c of 8.8. Resume home dose NovoLog. Continue sliding scale coverage.      History of CVA (cerebrovascular accident)  Continue aspirin, Plavix and statin.   Peripheral vascular disease Continue aspirin and Plavix.  GERD Continue PPI  Tobacco use Counseled strongly on cessation.        Code Status : Full code  Family Communication  : Daughter at bedside  Disposition Plan  : Home tomorrow if continues to improve    Consults  : Cardiology  Procedures  :  Left heart catheterization  DVT Prophylaxis  :  Lovenox   Lab Results  Component Value Date   PLT 152 10/05/2015    Antibiotics  :    Anti-infectives    Start     Dose/Rate Route Frequency Ordered Stop   10/07/15 1000  azithromycin (ZITHROMAX) tablet 250 mg     250 mg Oral Daily 10/05/15 2046 10/11/15 0959   10/06/15 1000  azithromycin (ZITHROMAX) tablet 500 mg     500 mg Oral Daily 10/05/15 2046 10/06/15 1027   10/05/15 1545  levofloxacin (LEVAQUIN) IVPB 500 mg     500 mg 100 mL/hr over 60 Minutes Intravenous  Once 10/05/15 1542 10/05/15 1727        Objective:   Filed Vitals:   10/06/15 1256 10/06/15 1301 10/06/15 1306 10/06/15 1311  BP: 125/73 114/66 112/67     Pulse: 80 78 76 0  Temp:      TempSrc:      Resp: '12 16 15 '$ 46  Height:      Weight:      SpO2: 100% 100% 99% 0%    Wt Readings from Last 3 Encounters:  10/06/15 54.432 kg (120 lb)  09/26/15 56.246 kg (124 lb)  09/26/15 55.339 kg (122 lb)     Intake/Output Summary (Last 24 hours) at 10/06/15 1527 Last data filed at 10/06/15 1407  Gross per 24 hour  Intake    560 ml  Output      0 ml  Net    560 ml     Physical Exam  Gen: not in distress HEENT: no pallor, moist mucosa, supple neck Chest: clear b/l, no added sounds CVS: N S1&S2, no murmurs, rubs or gallop GI: soft, NT, ND, BS+ Musculoskeletal: warm, no edema, right radial cath site clean CNS: Alert and oriented, nonfocal    Data Review:    CBC  Recent Labs Lab 10/02/15 0941 10/05/15 1420  WBC 4.5 7.5  HGB 13.6 14.0  HCT 42.0 41.2  PLT 153 152  MCV 90.5 90.4  MCH 29.3 30.7  MCHC 32.4 34.0  RDW 14.6 15.4  LYMPHSABS  --  0.4*  MONOABS  --  0.4  EOSABS  --  0.0  BASOSABS  --  0.1    Chemistries   Recent Labs Lab 10/02/15 0942 10/05/15 1420  NA 136 132*  K 4.3 4.2  CL 100 94*  CO2 30 31  GLUCOSE 303* 327*  BUN 10 12  CREATININE 0.69 0.78  CALCIUM 8.9 9.0  AST  --  22  ALT  --  18  ALKPHOS  --  135*  BILITOT  --  1.0   ------------------------------------------------------------------------------------------------------------------  Recent Labs  10/06/15 0500  CHOL 109  HDL  65  LDLCALC 36  TRIG 42  CHOLHDL 1.7    Lab Results  Component Value Date   HGBA1C 8.8* 05/28/2015   ------------------------------------------------------------------------------------------------------------------ No results for input(s): TSH, T4TOTAL, T3FREE, THYROIDAB in the last 72 hours.  Invalid input(s): FREET3 ------------------------------------------------------------------------------------------------------------------ No results for input(s): VITAMINB12, FOLATE, FERRITIN, TIBC, IRON,  RETICCTPCT in the last 72 hours.  Coagulation profile  Recent Labs Lab 10/05/15 1420  INR 0.92    No results for input(s): DDIMER in the last 72 hours.  Cardiac Enzymes  Recent Labs Lab 10/05/15 1420 10/06/15 0500 10/06/15 1407  TROPONINI <0.03 <0.03 0.03   ------------------------------------------------------------------------------------------------------------------ No results found for: BNP  Inpatient Medications  Scheduled Meds: . aspirin EC  81 mg Oral Daily  . [START ON 10/07/2015] azithromycin  250 mg Oral Daily  . buPROPion  300 mg Oral Daily  . clopidogrel  75 mg Oral Daily  . docusate sodium  100 mg Oral Daily  . enoxaparin (LOVENOX) injection  40 mg Subcutaneous Q24H  . fluticasone  2 spray Each Nare Daily  . fluticasone furoate-vilanterol  1 puff Inhalation Daily  . guaiFENesin  1,200 mg Oral BID  . insulin aspart  0-5 Units Subcutaneous QHS  . insulin aspart  0-9 Units Subcutaneous TID WC  . loratadine  10 mg Oral Daily  . methylPREDNISolone (SOLU-MEDROL) injection  60 mg Intravenous TID  . multivitamin with minerals  1 tablet Oral Daily  . omega-3 acid ethyl esters  1 g Oral BID  . pantoprazole  40 mg Oral Daily  . rosuvastatin  20 mg Oral Daily  . sodium chloride flush  3 mL Intravenous Q12H   Continuous Infusions: . sodium chloride 3 mL/kg/hr (10/06/15 1353)   PRN Meds:.sodium chloride, acetaminophen, ipratropium-albuterol, nitroGLYCERIN, ondansetron (ZOFRAN) IV, sodium chloride, sodium chloride flush  Micro Results Recent Results (from the past 240 hour(s))  MRSA PCR Screening     Status: Abnormal   Collection Time: 10/05/15  8:59 PM  Result Value Ref Range Status   MRSA by PCR POSITIVE (A) NEGATIVE Final    Comment:        The GeneXpert MRSA Assay (FDA approved for NASAL specimens only), is one component of a comprehensive MRSA colonization surveillance program. It is not intended to diagnose MRSA infection nor to guide or monitor  treatment for MRSA infections. RESULT CALLED TO, READ BACK BY AND VERIFIED WITH: L OAGR,RN '@[id number]'$  10/06/15 MKELLY   Respiratory Panel by PCR     Status: None   Collection Time: 10/05/15 11:10 PM  Result Value Ref Range Status   Adenovirus NOT DETECTED NOT DETECTED Final   Coronavirus 229E NOT DETECTED NOT DETECTED Final   Coronavirus HKU1 NOT DETECTED NOT DETECTED Final   Coronavirus NL63 NOT DETECTED NOT DETECTED Final   Coronavirus OC43 NOT DETECTED NOT DETECTED Final   Metapneumovirus NOT DETECTED NOT DETECTED Final   Rhinovirus / Enterovirus NOT DETECTED NOT DETECTED Final   Influenza A NOT DETECTED NOT DETECTED Final   Influenza A H1 NOT DETECTED NOT DETECTED Final   Influenza A H1 2009 NOT DETECTED NOT DETECTED Final   Influenza A H3 NOT DETECTED NOT DETECTED Final   Influenza B NOT DETECTED NOT DETECTED Final   Parainfluenza Virus 1 NOT DETECTED NOT DETECTED Final   Parainfluenza Virus 2 NOT DETECTED NOT DETECTED Final   Parainfluenza Virus 3 NOT DETECTED NOT DETECTED Final   Parainfluenza Virus 4 NOT DETECTED NOT DETECTED Final   Respiratory Syncytial Virus NOT DETECTED NOT DETECTED  Final   Bordetella pertussis NOT DETECTED NOT DETECTED Final   Chlamydophila pneumoniae NOT DETECTED NOT DETECTED Final   Mycoplasma pneumoniae NOT DETECTED NOT DETECTED Final    Radiology Reports Dg Chest 2 View  10/05/2015  CLINICAL DATA:  Shortness of breath, cough, and fever EXAM: CHEST  2 VIEW COMPARISON:  August 16, 2015 FINDINGS: No pneumothorax. The heart, hila, and mediastinum are unchanged. Increased interstitial/reticular markings are again seen, primarily in the bases, not significantly changed. No confluent infiltrate is identified. No other interval changes. IMPRESSION: Chronic interstitial changes in the lungs with no acute infiltrate identified. Electronically Signed   By: Dorise Bullion III M.D   On: 10/05/2015 14:15    Time Spent in minutes  25   Louellen Molder M.D on  10/06/2015 at 3:27 PM  Between 7am to 7pm - Pager - [phone]  After 7pm go to [email or web address] - password [facility]  [facility] -  Office  [phone]

## 2015-10-06 NOTE — H&P (View-Only) (Signed)
CARDIOLOGY CONSULT NOTE   Patient ID: Brenda [name] MRN: [id number], DOB/AGE: 1951-05-06   Admit date: 10/05/2015 Date of Consult: 10/06/2015   Primary Physician: [name], MD Primary Cardiologist: Dr. Ellyn Hack  Pt. Profile  Brenda [name] is a 65 year old female with past medical history of HLD, DM, COPD on nighttime 3L O2, long-standing tobacco abuse, CAD, PAD s/p bil femoropopliteal 1999/2000 with revision of right femoropopliteal in 2014, TIA/CVA, and history of carotid artery disease who had recently abnormal Myoview with ischemia in LAD distribution was originally scheduled for outpatient cath on 5/5, she came in one day prior with fever and productive cough and diagnosed with COPD exacerbation.  Problem List  Past Medical History  Diagnosis Date  . History of DVT (deep vein thrombosis)   . Coronary artery disease     multiple stens  . COPD (chronic obstructive pulmonary disease) ([place])   . Transient ischemic attack   . Depression   . Hx of radiation therapy 01/16/09 - 03/06/09    base of tongue, right neck node  . Peripheral arterial disease ([name])     status post bilateral femoropopliteal bypass graft performed by Dr. Sherren Mocha early in the past with significant right lower extremity lifestyle limiting claudication  . Pneumonia   . Hypercholesteremia   . Chronic bronchitis ([place])   . Shortness of breath     "w/exertion and sometimes when laying down" (11/26/2012)  . Type II diabetes mellitus ([place])   . History of blood transfusion 1955  . GERD (gastroesophageal reflux disease)   . H/O hiatal hernia   . Stroke [facility]) 2000    left side weakness remains (11/26/2012)  . Arthritis     "hands" (11/26/2012)  . Cancer of base of tongue ([place]) 11/04/2008    "& lymph nodes @ right neck" (11/26/2012  . Myocardial infarction [facility]) 2005    dr Ellyn Hack   . Fatigue 08/23/2013  . Carotid artery occlusion     Past Surgical History  Procedure Laterality Date  . Tee without cardioversion   07/26/2011    Procedure: TRANSESOPHAGEAL ECHOCARDIOGRAM (TEE);  Surgeon: Pixie Casino, MD;  Location: [facility] ENDOSCOPY;  Service: Cardiovascular;  Laterality: N/A;  . Esophagogastroduodenoscopy  12/20/2011    Procedure: ESOPHAGOGASTRODUODENOSCOPY (EGD);  Surgeon: Beryle Beams, MD;  Location: [facility] ENDOSCOPY;  Service: Endoscopy;  Laterality: N/A;  EGD with balloon dilation  . Savory dilation  12/20/2011    Procedure: SAVORY DILATION;  Surgeon: Beryle Beams, MD;  Location: [facility] ENDOSCOPY;  Service: Endoscopy;  Laterality: N/A;  . Tonsillectomy  1969  . Appendectomy  1962  . Cardiac catheterization  1999; 11/26/2012  . Coronary angioplasty with stent placement  1999--2005    "i think I have 3-5 total" (11/26/2012)  . Femoral bypass  1999; 2000    notes 11/26/2012  . Carpal tunnel release Right 1980's?  . Vaginal hysterectomy  1988  . Cesarean section  1978  . Tubal ligation  1988  . Cataract extraction w/ intraocular lens implant Right 2011  . Ankle fracture surgery Right 2007    "got a metal plate and 8 screws in it" (11/26/2012)  . Bypass graft popliteal to popliteal Right 02/05/2013    Procedure: BYPASS GRAFT ABOVE KNEE POPLITEAL TO BELOW KNEE POPLITEAL WITH SMALL SAPHANOUS VEIN;  Surgeon: Rosetta Posner, MD;  Location: [facility] OR;  Service: Vascular;  Laterality: Right;  . Eye surgery       Allergies  Allergies  Allergen Reactions  . Tape Hives  and Other (See Comments)    USE PAPER TAPE ONLY- Adhesive peels off skin-makes pt. Raw.  . Isosorbide Other (See Comments)    Drops BP too low  . Nicoderm [Nicotine] Rash    To the PATCH only, breakouts on skin    HPI   Brenda [name] is a 65 year old female with past medical history of HLD, DM, COPD on nighttime 3L O2, long-standing tobacco abuse, CAD, PAD s/p bil femoropopliteal 1999/2000 with revision of right femoropopliteal in 2014, TIA/CVA, and history of carotid artery disease. Last cardiac catheterization in 2017 revealed patent stent to LAD, left  circumflex and RCA. She had bilateral femoral popliteal bypass graft in 1999 and 2000 by Dr. Donnetta Hutching with revision of the right femoral-popliteal graft from prior AK-Pop to BK-pop with saphenous vein graft on 02/05/2013. She had history of stroke around 2000 with left-sided weakness. She continued to smoke to this day and has made several attempts at quitting. According to the patient, she has been seen at least 5 times last year for pneumonia. Last admission was in December 2016 around Christmas time when she came in with acute respiratory failure and pneumonia. She says someone told her that her pneumonia is possibly caused by aspiration.  She was last seen by Dr. Ellyn Hack in the office on 09/05/2015, at which time she complained of some exertional chest discomfort. Outpatient Myoview was obtained on 09/26/2015 which came back high risk with EF 51%, large severe reversible defect in the distal anterior wall, apex and distal inferior wall consistent with severe ischemia in the LAD distribution. She was seen back in the office, outpatient cardiac catheterization has been arranged after discussing risk and benefit. She was scheduled for cardiac catheterization by Dr. Ellyn Hack on 10/06/2015.   She started feeling bad on Tuesday 10/03/2015 with fatigue and non-productive cough. On Wednesday she started having fever. She called Dr. Allison Quarry office who recommended her to seek medical attention at local urgent care to rule out recurrent pneumonia. She could not find an urgent care with X ray, therefore sought medical attention at [place]. Chest x-ray showed no acute process. Patient was admitted by hospitalist group for potential COPD exacerbation. She was given IV steroid and started on 5 day course of azithromycin. She does have intermittent chest discomfort, however she is not sure if this is coming from her heart or her pulmonary issues. The last time she had any chest discomfort was yesterday before she went to the [facility] ED. Her EKG shows new T-wave inversion in V3 through V6 not seen on the previous EKGs. CBC is normal. CMP was significant for sodium 132, creatinine 0.78. So far she has had 3 negative troponin. Lipid panel obtained at this morning showed well-controlled cholesterol 109, triglyceride 42, HDL 65, LDL 36. Cardiology has been asked to sign out since patient was originally scheduled to have a cardiac catheterization today.    Inpatient Medications  . aspirin EC  81 mg Oral Daily  . azithromycin  500 mg Oral Daily   Followed by  . [START ON 10/07/2015] azithromycin  250 mg Oral Daily  . buPROPion  300 mg Oral Daily  . clopidogrel  75 mg Oral Daily  . docusate sodium  100 mg Oral Daily  . enoxaparin (LOVENOX) injection  40 mg Subcutaneous Q24H  . fluticasone  2 spray Each Nare Daily  . fluticasone furoate-vilanterol  1 puff Inhalation Daily  . guaiFENesin  1,200 mg Oral BID  . insulin aspart  0-5  Units Subcutaneous QHS  . insulin aspart  0-9 Units Subcutaneous TID WC  . loratadine  10 mg Oral Daily  . methylPREDNISolone (SOLU-MEDROL) injection  60 mg Intravenous TID  . multivitamin with minerals  1 tablet Oral Daily  . omega-3 acid ethyl esters  1 g Oral BID  . pantoprazole  40 mg Oral Daily  . rosuvastatin  20 mg Oral Daily    Family History Family History  Problem Relation Age of Onset  . Cancer Mother     pancreatic  . Diabetes Mother   . Hyperlipidemia Mother   . Hypertension Mother   . Other Mother     varicose vein  . Cancer Father 72    throat  . Heart disease Father   . Heart attack Father   . Cancer Sister     breast  . Diabetes Sister   . Deep vein thrombosis Brother   . Diabetes Brother   . Hearing loss Brother   . Hyperlipidemia Brother   . Hypertension Brother   . Heart attack Brother   . Clotting disorder Brother   . Diabetes Son   . Hyperlipidemia Son      Social History Social History   Social History  . Marital Status: Married    Spouse Name:  N/A  . Number of Children: N/A  . Years of Education: N/A   Occupational History  . house wife    Social History Main Topics  . Smoking status: Light Tobacco Smoker -- 1.00 packs/day for 50 years    Types: Cigarettes  . Smokeless tobacco: Never Used     Comment: down to .5ppd per pt 08/16/15  . Alcohol Use: 3.0 oz/week    5 Glasses of wine per week  . Drug Use: No  . Sexual Activity: Yes   Other Topics Concern  . Not on file   Social History Narrative     Review of Systems  General:  No chills, night sweats or weight changes.  +fever Cardiovascular:  No edema, orthopnea, palpitations, paroxysmal nocturnal dyspnea. +chest pain, chronic dyspnea Dermatological: No rash, lesions/masses Respiratory: No cough, dyspnea Urologic: No hematuria, dysuria Abdominal:   No nausea, vomiting, diarrhea, bright red blood per rectum, melena, or hematemesis Neurologic:  No visual changes, wkns, changes in mental status. All other systems reviewed and are otherwise negative except as noted above.  Physical Exam  Blood pressure 113/52, pulse 74, temperature 98 F (36.7 C), temperature source Oral, resp. rate 18, height '5\' 6"'$  (1.676 m), weight 120 lb (54.432 kg), SpO2 97 %.  General: Pleasant, NAD Psych: Normal affect. Neuro: Alert and oriented X 3. Moves all extremities spontaneously. HEENT: Normal  Neck: Supple without bruits or JVD. Lungs:  Resp regular and unlabored. Diminished breath sounds throughout, however no obvious rale, rhonchi or wheezing Heart: RRR no s3, s4, or murmurs. Abdomen: Soft, non-tender, non-distended, BS + x 4.  Extremities: No clubbing, cyanosis or edema. DP/PT/Radials 2+ and equal bilaterally.  Labs   Recent Labs  10/05/15 1420 10/06/15 0500  TROPONINI <0.03 <0.03   Lab Results  Component Value Date   WBC 7.5 10/05/2015   HGB 14.0 10/05/2015   HCT 41.2 10/05/2015   MCV 90.4 10/05/2015   PLT 152 10/05/2015    Recent Labs Lab 10/05/15 1420  NA 132*   K 4.2  CL 94*  CO2 31  BUN 12  CREATININE 0.78  CALCIUM 9.0  PROT 6.9  BILITOT 1.0  ALKPHOS 135*  ALT 18  AST 22  GLUCOSE 327*   Lab Results  Component Value Date   CHOL 109 10/06/2015   HDL 65 10/06/2015   LDLCALC 36 10/06/2015   TRIG 42 10/06/2015   No results found for: DDIMER  Radiology/Studies  Dg Chest 2 View  10/05/2015  CLINICAL DATA:  Shortness of breath, cough, and fever EXAM: CHEST  2 VIEW COMPARISON:  August 16, 2015 FINDINGS: No pneumothorax. The heart, hila, and mediastinum are unchanged. Increased interstitial/reticular markings are again seen, primarily in the bases, not significantly changed. No confluent infiltrate is identified. No other interval changes. IMPRESSION: Chronic interstitial changes in the lungs with no acute infiltrate identified. Electronically Signed   By: Dorise Bullion III M.D   On: 10/05/2015 14:15    ECG  Normal sinus rhythm with T-wave inversion in V3 through V6 that is new.   ASSESSMENT AND PLAN  1. Progressive angina with abnormal Myoview and EKG changes  - Myoview 09/26/2015 EF 51%, large severe reversible defect in the distal anterior wall, apex and distal inferior wall consistent with severe ischemia in the LAD distribution. EKG also shows new T-wave inversion from V3 through V6  - The plan is to pursue cardiac catheterization as previously said scheduled in the office. The question is timing of cath at this point. Patient continued to have cough and have productive sputum. Currently receiving IV steroid and 5 doses azithromycin.   Addendum: discussed with MD, patient will undergo cath today  2. CAD s/p multiple PCI in LAD, LCx and RCA in 1999, 2001, 2003   3. PAD s/p bil femoropopliteal 1999/2000 with revision of right femoropopliteal in 2014  4. HLD 5. DM 6. COPD on nighttime 3L O2  -  long-standing tobacco abuse, still smoking, although cut back to a few cigarette per day 7. TIA/CVA  Signed, Almyra Deforest, PA-C 10/06/2015,  8:36 AM As above, patient seen and examined. Patient with past medical history of diabetes mellitus, hyperlipidemia, coronary artery disease, COPD, tobacco abuse peripheral vascular disease, prior CVA for evaluation of chest pain. Patient seen recently in the office for chest pain. Nuclear study was high risk and suggested LAD disease. She will scheduled for a cardiac catheterization this morning. However Tuesday of this week she developed fevers and malaise. She also developed a cough. Her last fever was Wednesday. She went to urgent care for a chest x-ray to exclude pneumonia.She was sent to the hospital for further management. She notes lower rib pain with cough. She also has some substernal chest pain with exertion.  Patient does have a URI but is afebrile and white blood cell count normal. Chest xray shows no acute infiltrate. She states her breathing is normal. I think we can proceed with cardiac catheterization this morning. The risks and benefits including stroke, myocardial infarction and death discussed and patient agrees to proceed. Continue aspirin, Plavix and statin. She will continue on pulmonary toilet and antibiotics for URI. Kirk Ruths

## 2015-10-06 NOTE — Interval H&P Note (Signed)
History and Physical Interval Note:  10/06/2015 11:15 AM  Brenda [name]  has presented today for surgery, with the diagnosis of chest pain with high risk for cardiac etiology and positive/high risk stress test - with anterior ischemia  The patient was seen in April for exertional chest tightness and burning that was thought to be high risk for possible crescendo. She has known coronary disease with PCI in 1999, 2001 and 2003 with the last one being 2005 has a history of stents in the LAD, circumflex and RCA. She was evaluated with a stress test showing evidence of anterior ischemia and then followed up with Tarri Fuller, PA-C for precath H&P visit to discuss the results the stress test. He was scheduled for catheterization today, however she began last night to the emergency room with what sounded to COPD exacerbation/ possible pneumonia. Clinically she appears to be relatively stable, therefore Dr. Stanford Breed felt like she was okay for catheterization today.   The various methods of treatment have been discussed with the patient and family. After consideration of risks, benefits and other options for treatment, the patient has consented to  Procedure(s): Left Heart Cath and Coronary Angiography (N/A) With Possible Percutaneous Coronary Intervention as a surgical intervention .  The patient's history has been reviewed, patient examined, no change in status, stable for surgery.  I have reviewed the patient's chart and labs.  Questions were answered to the patient's satisfaction.    Cath Lab Visit (complete for each Cath Lab visit)  Clinical Evaluation Leading to the Procedure:   ACS: No.  Non-ACS:    Anginal Classification: CCS III  Anti-ischemic medical therapy: Minimal Therapy (1 class of medications)  Non-Invasive Test Results: High-risk stress test findings: cardiac mortality >3%/year  Prior CABG: No previous CABG  Ischemic Symptoms? CCS III (Marked limitation of ordinary  activity) Anti-ischemic Medical Therapy? Minimal Therapy (1 class of medications) Non-invasive Test Results? High-risk stress test findings: cardiac mortality >3%/yr Prior CABG? No Previous CABG   Patient Information:   1-2V CAD, no prox LAD  A (8)  Indication: 18; Score: 8   Patient Information:   CTO of 1 vessel, no other CAD  A (7)  Indication: 28; Score: 7   Patient Information:   1V CAD with prox LAD  A (9)  Indication: 34; Score: 9   Patient Information:   2V-CAD with prox LAD  A (9)  Indication: 40; Score: 9   Patient Information:   3V-CAD without LMCA  A (9)  Indication: 46; Score: 9   Patient Information:   3V-CAD without LMCA With Abnormal LV systolic function  A (9)  Indication: 48; Score: 9   Patient Information:   LMCA-CAD  A (9)  Indication: 49; Score: 9   Patient Information:   2V-CAD with prox LAD PCI  A (7)  Indication: 62; Score: 7   Patient Information:   2V-CAD with prox LAD CABG  A (8)  Indication: 62; Score: 8   Patient Information:   3V-CAD without LMCA With Low CAD burden(i.e., 3 focal stenoses, low SYNTAX score) PCI  A (7)  Indication: 63; Score: 7   Patient Information:   3V-CAD without LMCA With Low CAD burden(i.e., 3 focal stenoses, low SYNTAX score) CABG  A (9)  Indication: 63; Score: 9   Patient Information:   3V-CAD without LMCA E06c - Intermediate-high CAD burden (i.e., multiple diffuse lesions, presence of CTO, or high SYNTAX score) PCI  U (4)  Indication: 64; Score: 4  Patient Information:   3V-CAD without LMCA E06c - Intermediate-high CAD burden (i.e., multiple diffuse lesions, presence of CTO, or high SYNTAX score) CABG  A (9)  Indication: 64; Score: 9   Patient Information:   LMCA-CAD With Isolated LMCA stenosis  PCI  U (6)  Indication: 65; Score: 6   Patient Information:   LMCA-CAD With Isolated LMCA stenosis  CABG  A (9)  Indication: 65; Score:  9   Patient Information:   LMCA-CAD Additional CAD, low CAD burden (i.e., 1- to 2-vessel additional involvement, low SYNTAX score) PCI  U (5)  Indication: 66; Score: 5   Patient Information:   LMCA-CAD Additional CAD, low CAD burden (i.e., 1- to 2-vessel additional involvement, low SYNTAX score) CABG  A (9)  Indication: 66; Score: 9   Patient Information:   LMCA-CAD Additional CAD, intermediate-high CAD burden (i.e., 3-vessel involvement, presence of CTO, or high SYNTAX score) PCI  I (3)  Indication: 67; Score: 3   Patient Information:   LMCA-CAD Additional CAD, intermediate-high CAD burden (i.e., 3-vessel involvement, presence of CTO, or high SYNTAX score) CABG  A (9)  Indication: 67; Score: 9    HARDING, DAVID W

## 2015-10-06 NOTE — Telephone Encounter (Signed)
Was admitted overnight 5/4. Had Cath & PCI on 5/5 by me  Jessice Madill, Leonie Green, MD

## 2015-10-06 NOTE — Progress Notes (Signed)
Initial Nutrition Assessment  DOCUMENTATION CODES:   Not applicable  INTERVENTION:   -Glucerna Shake po BID, each supplement provides 220 kcal and 10 grams of protein  NUTRITION DIAGNOSIS:   Increased nutrient needs related to inability to eat, chronic illness as evidenced by estimated needs.  GOAL:   Patient will meet greater than or equal to 90% of their needs  MONITOR:   PO intake, Labs, Weight trends, Skin, I & O's, Supplement acceptance  REASON FOR ASSESSMENT:   Consult Assessment of nutrition requirement/status  ASSESSMENT:   Brenda [name] is a 65 y.o. 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  Pt admitted with COPD exacerbation.   Pt down in cath lab at times of visits. Unable to complete Nutrition-Focused physical exam at this time.   Per chart review (outpatient pulmonology notes), appetite has increased within the past month due to cutting back on smoking. Pt currently NPO due to heart cath. Noted pt with hx of dysphagia; last MBSS was on 07/10/15, which recommended a dysphagia 3 diet with nectar thick liquids.   Case discussed with RN. She confirms that pt was just taken to the cath lab and unsure of return. She reports that pt does not appear malnourished, but is very petite.   Per documented wt hx, pt with distant wt of weight loss. Wt changes over the past year are not significant for time frames.   Addendum: Pt has been transferred to cath recovery unit and placed on a heart healthy/carb modified diet. RD will add Glucerna supplement to optimize nutritional intake.   Labs reviewed: CBG: 166. Last Hgb A1c: 8.8  (05/28/15).  Diet Order:  Diet heart healthy/carb modified Room service appropriate?: Yes; Fluid consistency:: Thin  Skin:  Reviewed, no issues  Last BM:  10/05/15  Height:   Ht Readings from Last 1 Encounters:  10/05/15 '5\' 6"'$  (1.676 m)    Weight:   Wt Readings from Last 1 Encounters:  10/06/15 120 lb (54.432 kg)    Ideal Body Weight:  59.1 kg  BMI:  Body mass index is 19.38 kg/(m^2).  Estimated Nutritional Needs:   Kcal:  1600-1800  Protein:  70-85 grams  Fluid:  1.6-1.8 L  EDUCATION NEEDS:   No education needs identified at this time  Edelyn Heidel A. Jimmye Norman, RD, LDN, CDE Pager: [phone] After hours Pager: [phone]

## 2015-10-06 NOTE — Care Management Important Message (Signed)
Important Message  Patient Details  Name: Brenda [name] MRN: [id number] Date of Birth: [birth year]   Medicare Important Message Given:  Yes    Zenon Mayo, RN 10/06/2015, 4:15 PMImportant Message  Patient Details  Name: Brenda [name] MRN: [id number] Date of Birth: 1950/10/08   Medicare Important Message Given:  Yes    Zenon Mayo, RN 10/06/2015, 4:15 PM

## 2015-10-06 NOTE — Progress Notes (Signed)
TR BAND REMOVAL  LOCATION:    right radial  DEFLATED PER PROTOCOL:    Yes.    TIME BAND OFF / DRESSING APPLIED:    1715   SITE UPON ARRIVAL:    Level 0  SITE AFTER BAND REMOVAL:    Level 0  CIRCULATION SENSATION AND MOVEMENT:    Within Normal Limits   Yes.    COMMENTS:   Tolerated procedure well

## 2015-10-06 NOTE — Consult Note (Signed)
CARDIOLOGY CONSULT NOTE   Patient ID: CLARISSIA MCKEEN MRN: [id number], DOB/AGE: 65-Jul-1952   Admit date: 10/05/2015 Date of Consult: 10/06/2015   Primary Physician: [name], MD Primary Cardiologist: Dr. Ellyn Hack  Pt. Profile  Mrs. Kras is a 65 year old female with past medical history of HLD, DM, COPD on nighttime 3L O2, long-standing tobacco abuse, CAD, PAD s/p bil femoropopliteal 1999/2000 with revision of right femoropopliteal in 2014, TIA/CVA, and history of carotid artery disease who had recently abnormal Myoview with ischemia in LAD distribution was originally scheduled for outpatient cath on 5/5, she came in one day prior with fever and productive cough and diagnosed with COPD exacerbation.  Problem List  Past Medical History  Diagnosis Date  . History of DVT (deep vein thrombosis)   . Coronary artery disease     multiple stens  . COPD (chronic obstructive pulmonary disease) ([place])   . Transient ischemic attack   . Depression   . Hx of radiation therapy 01/16/09 - 03/06/09    base of tongue, right neck node  . Peripheral arterial disease ([name])     status post bilateral femoropopliteal bypass graft performed by Dr. Sherren Mocha early in the past with significant right lower extremity lifestyle limiting claudication  . Pneumonia   . Hypercholesteremia   . Chronic bronchitis ([place])   . Shortness of breath     "w/exertion and sometimes when laying down" (11/26/2012)  . Type II diabetes mellitus ([place])   . History of blood transfusion 1955  . GERD (gastroesophageal reflux disease)   . H/O hiatal hernia   . Stroke [facility]) 2000    left side weakness remains (11/26/2012)  . Arthritis     "hands" (11/26/2012)  . Cancer of base of tongue ([place]) 11/04/2008    "& lymph nodes @ right neck" (11/26/2012  . Myocardial infarction [facility]) 2005    dr Ellyn Hack   . Fatigue 08/23/2013  . Carotid artery occlusion     Past Surgical History  Procedure Laterality Date  . Tee without cardioversion   07/26/2011    Procedure: TRANSESOPHAGEAL ECHOCARDIOGRAM (TEE);  Surgeon: Pixie Casino, MD;  Location: [facility] ENDOSCOPY;  Service: Cardiovascular;  Laterality: N/A;  . Esophagogastroduodenoscopy  12/20/2011    Procedure: ESOPHAGOGASTRODUODENOSCOPY (EGD);  Surgeon: Beryle Beams, MD;  Location: [facility] ENDOSCOPY;  Service: Endoscopy;  Laterality: N/A;  EGD with balloon dilation  . Savory dilation  12/20/2011    Procedure: SAVORY DILATION;  Surgeon: Beryle Beams, MD;  Location: [facility] ENDOSCOPY;  Service: Endoscopy;  Laterality: N/A;  . Tonsillectomy  1969  . Appendectomy  1962  . Cardiac catheterization  1999; 11/26/2012  . Coronary angioplasty with stent placement  1999--2005    "i think I have 3-5 total" (11/26/2012)  . Femoral bypass  1999; 2000    notes 11/26/2012  . Carpal tunnel release Right 1980's?  . Vaginal hysterectomy  1988  . Cesarean section  1978  . Tubal ligation  1988  . Cataract extraction w/ intraocular lens implant Right 2011  . Ankle fracture surgery Right 2007    "got a metal plate and 8 screws in it" (11/26/2012)  . Bypass graft popliteal to popliteal Right 02/05/2013    Procedure: BYPASS GRAFT ABOVE KNEE POPLITEAL TO BELOW KNEE POPLITEAL WITH SMALL SAPHANOUS VEIN;  Surgeon: Rosetta Posner, MD;  Location: [facility] OR;  Service: Vascular;  Laterality: Right;  . Eye surgery       Allergies  Allergies  Allergen Reactions  . Tape Hives  and Other (See Comments)    USE PAPER TAPE ONLY- Adhesive peels off skin-makes pt. Raw.  . Isosorbide Other (See Comments)    Drops BP too low  . Nicoderm [Nicotine] Rash    To the PATCH only, breakouts on skin    HPI   Mrs. Rosenkranz is a 65 year old female with past medical history of HLD, DM, COPD on nighttime 3L O2, long-standing tobacco abuse, CAD, PAD s/p bil femoropopliteal 1999/2000 with revision of right femoropopliteal in 2014, TIA/CVA, and history of carotid artery disease. Last cardiac catheterization in 2017 revealed patent stent to LAD, left  circumflex and RCA. She had bilateral femoral popliteal bypass graft in 1999 and 2000 by Dr. Donnetta Hutching with revision of the right femoral-popliteal graft from prior AK-Pop to BK-pop with saphenous vein graft on 02/05/2013. She had history of stroke around 2000 with left-sided weakness. She continued to smoke to this day and has made several attempts at quitting. According to the patient, she has been seen at least 5 times last year for pneumonia. Last admission was in December 2016 around Christmas time when she came in with acute respiratory failure and pneumonia. She says someone told her that her pneumonia is possibly caused by aspiration.  She was last seen by Dr. Ellyn Hack in the office on 09/05/2015, at which time she complained of some exertional chest discomfort. Outpatient Myoview was obtained on 09/26/2015 which came back high risk with EF 51%, large severe reversible defect in the distal anterior wall, apex and distal inferior wall consistent with severe ischemia in the LAD distribution. She was seen back in the office, outpatient cardiac catheterization has been arranged after discussing risk and benefit. She was scheduled for cardiac catheterization by Dr. Ellyn Hack on 10/06/2015.   She started feeling bad on Tuesday 10/03/2015 with fatigue and non-productive cough. On Wednesday she started having fever. She called Dr. Allison Quarry office who recommended her to seek medical attention at local urgent care to rule out recurrent pneumonia. She could not find an urgent care with X ray, therefore sought medical attention at [facility]. Chest x-ray showed no acute process. Patient was admitted by hospitalist group for potential COPD exacerbation. She was given IV steroid and started on 5 day course of azithromycin. She does have intermittent chest discomfort, however she is not sure if this is coming from her heart or her pulmonary issues. The last time she had any chest discomfort was yesterday before she went to the [facility] ED. Her EKG shows new T-wave inversion in V3 through V6 not seen on the previous EKGs. CBC is normal. CMP was significant for sodium 132, creatinine 0.78. So far she has had 3 negative troponin. Lipid panel obtained at this morning showed well-controlled cholesterol 109, triglyceride 42, HDL 65, LDL 36. Cardiology has been asked to sign out since patient was originally scheduled to have a cardiac catheterization today.    Inpatient Medications  . aspirin EC  81 mg Oral Daily  . azithromycin  500 mg Oral Daily   Followed by  . [START ON 10/07/2015] azithromycin  250 mg Oral Daily  . buPROPion  300 mg Oral Daily  . clopidogrel  75 mg Oral Daily  . docusate sodium  100 mg Oral Daily  . enoxaparin (LOVENOX) injection  40 mg Subcutaneous Q24H  . fluticasone  2 spray Each Nare Daily  . fluticasone furoate-vilanterol  1 puff Inhalation Daily  . guaiFENesin  1,200 mg Oral BID  . insulin aspart  0-5  Units Subcutaneous QHS  . insulin aspart  0-9 Units Subcutaneous TID WC  . loratadine  10 mg Oral Daily  . methylPREDNISolone (SOLU-MEDROL) injection  60 mg Intravenous TID  . multivitamin with minerals  1 tablet Oral Daily  . omega-3 acid ethyl esters  1 g Oral BID  . pantoprazole  40 mg Oral Daily  . rosuvastatin  20 mg Oral Daily    Family History Family History  Problem Relation Age of Onset  . Cancer Mother     pancreatic  . Diabetes Mother   . Hyperlipidemia Mother   . Hypertension Mother   . Other Mother     varicose vein  . Cancer Father 72    throat  . Heart disease Father   . Heart attack Father   . Cancer Sister     breast  . Diabetes Sister   . Deep vein thrombosis Brother   . Diabetes Brother   . Hearing loss Brother   . Hyperlipidemia Brother   . Hypertension Brother   . Heart attack Brother   . Clotting disorder Brother   . Diabetes Son   . Hyperlipidemia Son      Social History Social History   Social History  . Marital Status: Married    Spouse Name:  N/A  . Number of Children: N/A  . Years of Education: N/A   Occupational History  . house wife    Social History Main Topics  . Smoking status: Light Tobacco Smoker -- 1.00 packs/day for 50 years    Types: Cigarettes  . Smokeless tobacco: Never Used     Comment: down to .5ppd per pt 08/16/15  . Alcohol Use: 3.0 oz/week    5 Glasses of wine per week  . Drug Use: No  . Sexual Activity: Yes   Other Topics Concern  . Not on file   Social History Narrative     Review of Systems  General:  No chills, night sweats or weight changes.  +fever Cardiovascular:  No edema, orthopnea, palpitations, paroxysmal nocturnal dyspnea. +chest pain, chronic dyspnea Dermatological: No rash, lesions/masses Respiratory: No cough, dyspnea Urologic: No hematuria, dysuria Abdominal:   No nausea, vomiting, diarrhea, bright red blood per rectum, melena, or hematemesis Neurologic:  No visual changes, wkns, changes in mental status. All other systems reviewed and are otherwise negative except as noted above.  Physical Exam  Blood pressure 113/52, pulse 74, temperature 98 F (36.7 C), temperature source Oral, resp. rate 18, height '5\' 6"'$  (1.676 m), weight 120 lb (54.432 kg), SpO2 97 %.  General: Pleasant, NAD Psych: Normal affect. Neuro: Alert and oriented X 3. Moves all extremities spontaneously. HEENT: Normal  Neck: Supple without bruits or JVD. Lungs:  Resp regular and unlabored. Diminished breath sounds throughout, however no obvious rale, rhonchi or wheezing Heart: RRR no s3, s4, or murmurs. Abdomen: Soft, non-tender, non-distended, BS + x 4.  Extremities: No clubbing, cyanosis or edema. DP/PT/Radials 2+ and equal bilaterally.  Labs   Recent Labs  10/05/15 1420 10/06/15 0500  TROPONINI <0.03 <0.03   Lab Results  Component Value Date   WBC 7.5 10/05/2015   HGB 14.0 10/05/2015   HCT 41.2 10/05/2015   MCV 90.4 10/05/2015   PLT 152 10/05/2015    Recent Labs Lab 10/05/15 1420  NA 132*   K 4.2  CL 94*  CO2 31  BUN 12  CREATININE 0.78  CALCIUM 9.0  PROT 6.9  BILITOT 1.0  ALKPHOS 135*  ALT 18  AST 22  GLUCOSE 327*   Lab Results  Component Value Date   CHOL 109 10/06/2015   HDL 65 10/06/2015   LDLCALC 36 10/06/2015   TRIG 42 10/06/2015   No results found for: DDIMER  Radiology/Studies  Dg Chest 2 View  10/05/2015  CLINICAL DATA:  Shortness of breath, cough, and fever EXAM: CHEST  2 VIEW COMPARISON:  August 16, 2015 FINDINGS: No pneumothorax. The heart, hila, and mediastinum are unchanged. Increased interstitial/reticular markings are again seen, primarily in the bases, not significantly changed. No confluent infiltrate is identified. No other interval changes. IMPRESSION: Chronic interstitial changes in the lungs with no acute infiltrate identified. Electronically Signed   By: Dorise Bullion III M.D   On: 10/05/2015 14:15    ECG  Normal sinus rhythm with T-wave inversion in V3 through V6 that is new.   ASSESSMENT AND PLAN  1. Progressive angina with abnormal Myoview and EKG changes  - Myoview 09/26/2015 EF 51%, large severe reversible defect in the distal anterior wall, apex and distal inferior wall consistent with severe ischemia in the LAD distribution. EKG also shows new T-wave inversion from V3 through V6  - The plan is to pursue cardiac catheterization as previously said scheduled in the office. The question is timing of cath at this point. Patient continued to have cough and have productive sputum. Currently receiving IV steroid and 5 doses azithromycin.   Addendum: discussed with MD, patient will undergo cath today  2. CAD s/p multiple PCI in LAD, LCx and RCA in 1999, 2001, 2003   3. PAD s/p bil femoropopliteal 1999/2000 with revision of right femoropopliteal in 2014  4. HLD 5. DM 6. COPD on nighttime 3L O2  -  long-standing tobacco abuse, still smoking, although cut back to a few cigarette per day 7. TIA/CVA  Signed, Almyra Deforest, PA-C 10/06/2015,  8:36 AM As above, patient seen and examined. Patient with past medical history of diabetes mellitus, hyperlipidemia, coronary artery disease, COPD, tobacco abuse peripheral vascular disease, prior CVA for evaluation of chest pain. Patient seen recently in the office for chest pain. Nuclear study was high risk and suggested LAD disease. She will scheduled for a cardiac catheterization this morning. However Tuesday of this week she developed fevers and malaise. She also developed a cough. Her last fever was Wednesday. She went to urgent care for a chest x-ray to exclude pneumonia.She was sent to the hospital for further management. She notes lower rib pain with cough. She also has some substernal chest pain with exertion.  Patient does have a URI but is afebrile and white blood cell count normal. Chest xray shows no acute infiltrate. She states her breathing is normal. I think we can proceed with cardiac catheterization this morning. The risks and benefits including stroke, myocardial infarction and death discussed and patient agrees to proceed. Continue aspirin, Plavix and statin. She will continue on pulmonary toilet and antibiotics for URI. Kirk Ruths

## 2015-10-06 NOTE — Evaluation (Addendum)
Physical Therapy Evaluation Patient Details Name: Brenda [name] MRN: [id number] DOB: 1950/09/07 Today's Date: 10/06/2015   History of Present Illness  65 y.o. female with h/o DM, TIA, CVA, smoker, PVD, CAD, COPD, femoral bypass, recurrent PNA admitted with cough, fever, SOB. Dx of COPD exacerbation.   Clinical Impression  Pt admitted with above diagnosis. Pt currently with functional limitations due to the deficits listed below (see PT Problem List). Pt ambulated 200' without an assistive device, SaO2 dropped to 80% on RA with walking. Pt reports wearing 3L O2 at night only at home, advised pt to wear O2 when walking now. SaO2 93% on RA at rest. Pt will benefit from skilled PT to increase their independence and safety with mobility to allow discharge to the venue listed below.       Follow Up Recommendations No PT follow up (pt reports she had HHPT several months ago and she "passed all their tests", feels she doesn't need HHPT)    Equipment Recommendations  None recommended by PT    Recommendations for Other Services       Precautions / Restrictions   Monitor O2     Mobility  Bed Mobility Overal bed mobility: Independent                Transfers Overall transfer level: Independent                  Ambulation/Gait Ambulation/Gait assistance: Min guard Ambulation Distance (Feet): 200 Feet Assistive device: None Gait Pattern/deviations: WFL(Within Functional Limits)   Gait velocity interpretation: at or above normal speed for age/gender General Gait Details: distance limited by B calve pain (this is baseline), SaO2 93% on RA at rest, dropped to 80% on RA with ambulation, up to 92% with several minutes rest and 4L O2 [place], 1/4 dyspnea walking, no LOB  Stairs            Wheelchair Mobility    Modified Rankin (Stroke Patients Only)       Balance Overall balance assessment: History of Falls (1 fall in past year (June '16))                                            Pertinent Vitals/Pain Pain Assessment: 0-10 Pain Score: 3  Pain Location: calves with walking Pain Descriptors / Indicators: Aching Pain Intervention(s): Limited activity within patient's tolerance;Monitored during session    Home Living Family/patient expects to be discharged to:: Private residence Living Arrangements: Spouse/significant other;Children;Other relatives Available Help at Discharge: Family;Available PRN/intermittently Type of Home: House Home Access: Stairs to enter   Entrance Stairs-Number of Steps: 3 Home Layout: Two level;Able to live on main level with bedroom/bathroom Home Equipment: Bedside commode;Shower seat;Walker - 2 wheels;Cane - single point;Grab bars - tub/shower;Crutches;Other (comment) Additional Comments: oxygen at night    Prior Function Level of Independence: Independent         Comments: uses cane occasionally when outside, 1 fall in past year in June '16 but reports bumping into walls frequently due to "dizzy spells"     Hand Dominance   Dominant Hand: Right    Extremity/Trunk Assessment   Upper Extremity Assessment: Defer to OT evaluation           Lower Extremity Assessment: Overall WFL for tasks assessed (knee ext 4/5 B, sensation intact to light touch B feet)  Cervical / Trunk Assessment: Normal  Communication   Communication: No difficulties  Cognition Arousal/Alertness: Awake/alert Behavior During Therapy: WFL for tasks assessed/performed Overall Cognitive Status: Within Functional Limits for tasks assessed                      General Comments      Exercises        Assessment/Plan    PT Assessment Patient needs continued PT services  PT Diagnosis Difficulty walking   PT Problem List Decreased activity tolerance;Decreased balance;Decreased mobility;Cardiopulmonary status limiting activity  PT Treatment Interventions Gait training;Functional mobility training;Balance  training;Therapeutic exercise;Therapeutic activities   PT Goals (Current goals can be found in the Care Plan section) Acute Rehab PT Goals Patient Stated Goal: gardening PT Goal Formulation: With patient/family Time For Goal Achievement: 10/20/15 Potential to Achieve Goals: Good    Frequency Min 3X/week   Barriers to discharge        Co-evaluation               End of Session Equipment Utilized During Treatment: Gait belt;Oxygen Activity Tolerance: Patient tolerated treatment well Patient left: in chair;with call bell/phone within reach;with family/visitor present Nurse Communication: Mobility status    Functional Assessment Tool Used: clinical judgement Functional Limitation: Mobility: Walking and moving around Mobility: Walking and Moving Around Current Status (V7505): At least 20 percent but less than 40 percent impaired, limited or restricted Mobility: Walking and Moving Around Goal Status [phone]): At least 1 percent but less than 20 percent impaired, limited or restricted    Time: 0949-1012 PT Time Calculation (min) (ACUTE ONLY): 23 min   Charges:   PT Evaluation $PT Eval Low Complexity: 1 Procedure PT Treatments $Gait Training: 8-22 mins   PT G Codes:   PT G-Codes **NOT FOR INPATIENT CLASS** Functional Assessment Tool Used: clinical judgement Functional Limitation: Mobility: Walking and moving around Mobility: Walking and Moving Around Current Status (O2518): At least 20 percent but less than 40 percent impaired, limited or restricted Mobility: Walking and Moving Around Goal Status [phone]): At least 1 percent but less than 20 percent impaired, limited or restricted    Philomena Doheny 10/06/2015, 10:24 AM [phone]

## 2015-10-06 NOTE — Progress Notes (Signed)
2000 pt SBP 70's and 80's; paged Dr. Colon Flattery, called back ordered NS 250 bolus, once complete NS @ 39m/hr for 5 hours for a total fluid intake of 500 ml. Will continue to monitor. Pt is asymptomatic, HR is 70's SR. No bleeding from radial site, bilateral dp and pt are 2+. Right radial and ulner pulse 2+.

## 2015-10-06 NOTE — Care Management Note (Addendum)
Case Management Note  Patient Details  Name: Brenda [name] MRN: [id number] Date of Birth: 03-07-1951  Subjective/Objective:     NCM spoke with patient, she lives with spouse at home and she is indep. Patient is on plavix. Patient states she does not want to do the COPD program, she has been able to wean herself off continuous oxygen to just using 3 liters at night.  Patient has no other needs.              Action/Plan:   Expected Discharge Date:                  Expected Discharge Plan:  Home/Self Care  In-House Referral:     Discharge planning Services  CM Consult  Post Acute Care Choice:    Choice offered to:     DME Arranged:    DME Agency:     HH Arranged:    [place] Agency:     Status of Service:  Completed, signed off  Medicare Important Message Given:    Date Medicare IM Given:    Medicare IM give by:    Date Additional Medicare IM Given:    Additional Medicare Important Message give by:     If discussed at [place] of Stay Meetings, dates discussed:    Additional Comments:  Zenon Mayo, RN 10/06/2015, 3:49 PM

## 2015-10-06 NOTE — Progress Notes (Signed)
OT Cancellation Note  Patient Details Name: LOURINE ALBERICO MRN: [id number] DOB: Oct 02, 1950   Cancelled Treatment:    Reason Eval/Treat Not Completed: Patient at procedure or test/ unavailable  Darlina Rumpf [place], OTR/L [phone]  10/06/2015, 11:23 AM

## 2015-10-07 DIAGNOSIS — J9621 Acute and chronic respiratory failure with hypoxia: Secondary | ICD-10-CM

## 2015-10-07 LAB — HEMOGLOBIN A1C
HEMOGLOBIN A1C: 8.9 % — AB (ref 4.8–5.6)
Mean Plasma Glucose: 209 mg/dL

## 2015-10-07 LAB — CBC
HCT: 33.6 % — ABNORMAL LOW (ref 36.0–46.0)
Hemoglobin: 10.8 g/dL — ABNORMAL LOW (ref 12.0–15.0)
MCH: 28.9 pg (ref 26.0–34.0)
MCHC: 32.1 g/dL (ref 30.0–36.0)
MCV: 89.8 fL (ref 78.0–100.0)
Platelets: 143 10*3/uL — ABNORMAL LOW (ref 150–400)
RBC: 3.74 MIL/uL — AB (ref 3.87–5.11)
RDW: 14.8 % (ref 11.5–15.5)
WBC: 4.9 10*3/uL (ref 4.0–10.5)

## 2015-10-07 LAB — EXPECTORATED SPUTUM ASSESSMENT W GRAM STAIN, RFLX TO RESP C

## 2015-10-07 LAB — BASIC METABOLIC PANEL
Anion gap: 8 (ref 5–15)
BUN: 15 mg/dL (ref 6–20)
CHLORIDE: 102 mmol/L (ref 101–111)
CO2: 27 mmol/L (ref 22–32)
CREATININE: 0.7 mg/dL (ref 0.44–1.00)
Calcium: 8.5 mg/dL — ABNORMAL LOW (ref 8.9–10.3)
GFR calc non Af Amer: >60 mL/min (ref >60)
Glucose, Bld: 168 mg/dL — ABNORMAL HIGH (ref 65–99)
POTASSIUM: 4 mmol/L (ref 3.5–5.1)
SODIUM: 137 mmol/L (ref 135–145)

## 2015-10-07 LAB — EXPECTORATED SPUTUM ASSESSMENT W REFEX TO RESP CULTURE

## 2015-10-07 LAB — GLUCOSE, CAPILLARY: Glucose-Capillary: 138 mg/dL — ABNORMAL HIGH (ref 65–99)

## 2015-10-07 MED ORDER — MAGNESIUM HYDROXIDE 400 MG/5ML PO SUSP: 30.0000 mL | Freq: Every day | ORAL | Status: DC | PRN

## 2015-10-07 MED ORDER — METHYLPREDNISOLONE 4 MG PO TBPK: ORAL_TABLET | ORAL | Status: DC

## 2015-10-07 MED ORDER — AZITHROMYCIN 250 MG PO TABS: ORAL_TABLET | ORAL | Status: DC

## 2015-10-07 MED ORDER — IPRATROPIUM-ALBUTEROL 0.5-2.5 (3) MG/3ML IN SOLN: 3.0000 mL | RESPIRATORY_TRACT | Status: DC | PRN

## 2015-10-07 MED ORDER — ASPIRIN EC 81 MG PO TBEC: 81.0000 mg | DELAYED_RELEASE_TABLET | Freq: Every day | ORAL | Status: AC

## 2015-10-07 MED ORDER — CLOPIDOGREL BISULFATE 75 MG PO TABS: 75.0000 mg | ORAL_TABLET | Freq: Every day | ORAL | Status: DC

## 2015-10-07 NOTE — Discharge Summary (Signed)
Brenda [name], is a 65 y.o. female  DOB July 24, 1950  MRN [id number].  Admission date:  10/05/2015  Admitting Physician  Ivor Costa, MD  Discharge Date:  10/07/2015   Primary MD  [name], MD  Recommendations for primary care physician for things to follow:   Check CBC, BMP and a 2 view chest x-ray within a week. Outpatient follow-up and monitoring of CAD risk factors.   Admission Diagnosis  COPD exacerbation ([name]) [J44.1] EKG abnormality [R94.31]   Discharge Diagnosis  COPD exacerbation ([place]) [J44.1] EKG abnormality [R94.31]     Principal Problem:   Acute on chronic respiratory failure with hypoxia ([name]) Active Problems:   DM (diabetes mellitus), type 2, uncontrolled ([name])   TIA (transient ischemic attack)-2013   History of CVA (cerebrovascular accident)   Tobacco abuse   Hyperlipidemia with target LDL less than 70   PVD-Hx of BFBPG '99 and '00. Dopplers OK 6/13   Claudication in peripheral vascular disease ([place])   CAD: Stents in p-mRCA, p & m Cx, p-mLAD   COPD (chronic obstructive pulmonary disease) ([name])   COPD exacerbation ([name])   Angina, class III ([name])   Positive cardiac stress test   Abnormal nuclear stress test - HIGH RISK   Protein-calorie malnutrition, moderate ([name])   Depression   GERD (gastroesophageal reflux disease)   CAD S/P percutaneous coronary angioplasty   Angina pectoris [facility])      Past Medical History  Diagnosis Date  . History of DVT (deep vein thrombosis)   . Coronary artery disease     multiple stens  . COPD (chronic obstructive pulmonary disease) ([place])   . Transient ischemic attack   . Depression   . Hx of radiation therapy 01/16/09 - 03/06/09    base of tongue, right neck node  . Peripheral arterial disease ([name])     status post bilateral femoropopliteal bypass graft  performed by Dr. Sherren Mocha early in the past with significant right lower extremity lifestyle limiting claudication  . Pneumonia   . Hypercholesteremia   . Chronic bronchitis ([place])   . Shortness of breath     "w/exertion and sometimes when laying down" (11/26/2012)  . Type II diabetes mellitus ([place])   . History of blood transfusion 1955  . GERD (gastroesophageal reflux disease)   . H/O hiatal hernia   . Stroke [facility]) 2000    left side weakness remains (11/26/2012)  . Arthritis     "hands" (11/26/2012)  . Cancer of base of tongue ([place]) 11/04/2008    "& lymph nodes @ right neck" (11/26/2012  . Myocardial infarction [facility]) 2005    dr Ellyn Hack   . Fatigue 08/23/2013  . Carotid artery occlusion     Past Surgical History  Procedure Laterality Date  . Tee without cardioversion  07/26/2011    Procedure: TRANSESOPHAGEAL ECHOCARDIOGRAM (TEE);  Surgeon: Pixie Casino, MD;  Location: [facility] ENDOSCOPY;  Service: Cardiovascular;  Laterality: N/A;  . Esophagogastroduodenoscopy  12/20/2011    Procedure: ESOPHAGOGASTRODUODENOSCOPY (EGD);  Surgeon: Beryle Beams, MD;  Location: [facility] ENDOSCOPY;  Service: Endoscopy;  Laterality: N/A;  EGD with balloon dilation  . [place] dilation  12/20/2011    Procedure: SAVORY DILATION;  Surgeon: Beryle Beams, MD;  Location: [facility] ENDOSCOPY;  Service: Endoscopy;  Laterality: N/A;  . Tonsillectomy  1969  . Appendectomy  1962  . Cardiac catheterization  1999; 11/26/2012  . Coronary angioplasty with stent placement  1999--2005    "i think I have 3-5 total" (11/26/2012)  . Femoral bypass  1999; 2000    notes 11/26/2012  . Carpal tunnel release Right 1980's?  . Vaginal hysterectomy  1988  . Cesarean section  1978  . Tubal ligation  1988  . Cataract extraction w/ intraocular lens implant Right 2011  . Ankle fracture surgery Right 2007    "got a metal plate and 8 screws in it" (11/26/2012)  . Bypass graft popliteal to popliteal Right 02/05/2013    Procedure: BYPASS GRAFT ABOVE KNEE  POPLITEAL TO BELOW KNEE POPLITEAL WITH SMALL SAPHANOUS VEIN;  Surgeon: Rosetta Posner, MD;  Location: [facility] OR;  Service: Vascular;  Laterality: Right;  . Eye surgery         HPI  from the history and physical done on the day of admission:    65 year old female with history of COPD with ongoing tobacco use, (on 2 L. O2 at night), diabetes mellitus, GERD, coronary artery disease with prior stent placement, PAD to disclose femoral bypass, DVT not on antibiotics, TIA, stroke with left-sided weakness, cancer of the base of the tongue (status post radiation), [place] artery occlusion, GERD, depression presented with fever of 101F on nonproductive cough and increased shortness of breath with wheezing. Patient reports onset of symptoms on the day prior to admission and went to urgent care where they did an EKG and found to be somewhat abnormal and sent her to the ED In the ED she was mildly tachycardic with O2 sat of 86% on room air, normal CBC, electrolytes and troponin. Chest x-ray shows chronic interstitial changes. Patient admitted for acute URI symptoms. Patient recently had a Myoview done and was abnormal with reversible defect in the distal anterior wall, apex and distal inferior wall suggestive of severe ischemia in the LAD distribution and was scheduled for elective cath on 5/5 Patient seen by cardiology and underwent left heart catheterization showing severe LAD stenosis with moderate in-stent restenosis. A single DES stent was placed. Also severe ostial to proximal RPDA stenosis was treated with PTCA.     Hospital Course:    Acute on chronic hypoxic respiratory failure. Due to COPD exacerbation, much improved after treatment with steroids, she already has home oxygen, will be placed on oral steroid taper, continue home oxygen, she was treated with azithromycin here which will be continued for a few more days. Counseled to quit smoking and follow with PCP along with pulmonary post discharge.  CAD.  Patient had an elective left heart catheterization scheduled which was done yesterday by Dr. Ellyn Hack, she was found to have multivessel disease with a drug-eluting stent placed to RCA and angioplasty to severe ostial to proximal RPDA stenosis, she will be placed on indefinite dual antiplatelet therapy along with statin and beta blocker. Requested to follow with cardiology and PCP outpatient. Again counseled to stop smoking.  DM2 - in poor control, continue home regimen, continue home dose insulin, A1c was above 8. PCP to monitor glycemic control and A1c. She was told verbally by me to continue low carbohydrate diet and to do Accu-Cheks every  before meals at bedtime.  History of CVA/PAD. Continue secondary prevention with dual antiplatelet therapy and statin. Quit smoking.  GERD. On PPI.  Tobacco abuse. Counseled strictly to quit.   Follow UP  Follow-up Information    Follow up with HARDING, Leonie Green, MD.   Specialty:  Cardiology   Why:  The office will call you to make an appoinment., If you do not hear from them, please contact them., You should be seen within 1-2 weeks by a PA or NP   Contact information:   [address] [place] [address] [phone]       Follow up with [name], MD. Schedule an appointment as soon as possible for a visit in 1 week.   Specialty:  Family Medicine   Contact information:   [address] [place] [address] [phone]       Follow up with [facility], MD. Schedule an appointment as soon as possible for a visit in 1 week.   Specialty:  Pulmonary Disease   Contact information:   [place] [address] [phone]        Consults obtained - Cards  Discharge Condition: Stable  Diet and Activity recommendation: See Discharge Instructions below  Discharge Instructions       Discharge Instructions    Discharge instructions    Complete by:  As directed   Follow with Primary MD  [name], MD in 7 days   Get CBC, CMP, 2 view Chest X ray checked  by Primary MD next visit.    Activity: As tolerated with Full fall precautions use walker/cane & assistance as needed   Disposition Home    Diet:   Heart Healthy .  For Heart failure patients - Check your Weight same time everyday, if you gain over 2 pounds, or you develop in leg swelling, experience more shortness of breath or chest pain, call your Primary MD immediately. Follow Cardiac Low Salt Diet and 1.5 lit/day fluid restriction.   On your next visit with your primary care physician please Get Medicines reviewed and adjusted.   Please request your Prim.MD to go over all Hospital Tests and Procedure/Radiological results at the follow up, please get all Hospital records sent to your Prim MD by signing hospital release before you go home.   If you experience worsening of your admission symptoms, develop shortness of breath, life threatening emergency, suicidal or homicidal thoughts you must seek medical attention immediately by calling 911 or calling your MD immediately  if symptoms less severe.  You Must read complete instructions/literature along with all the possible adverse reactions/side effects for all the Medicines you take and that have been prescribed to you. Take any new Medicines after you have completely understood and accpet all the possible adverse reactions/side effects.   Do not drive, operating heavy machinery, perform activities at heights, swimming or participation in water activities or provide baby sitting services if your were admitted for syncope or siezures until you have seen by Primary MD or a Neurologist and advised to do so again.  Do not drive when taking Pain medications.    Do not take more than prescribed Pain, Sleep and Anxiety Medications  Special Instructions: If you have smoked or chewed Tobacco  in the last 2 yrs please stop smoking, stop any regular Alcohol  and or any  Recreational drug use.  Wear Seat belts while driving.   Please note  You were cared for  by a hospitalist during your hospital stay. If you have any questions about your discharge medications or the care you received while you were in the hospital after you are discharged, you can call the unit and asked to speak with the hospitalist on call if the hospitalist that took care of you is not available. Once you are discharged, your primary care physician will handle any further medical issues. Please note that NO REFILLS for any discharge medications will be authorized once you are discharged, as it is imperative that you return to your primary care physician (or establish a relationship with a primary care physician if you do not have one) for your aftercare needs so that they can reassess your need for medications and monitor your lab values.     Increase activity slowly    Complete by:  As directed              Discharge Medications       Medication List    TAKE these medications        acetaminophen 500 MG tablet  Commonly known as:  TYLENOL  Take 1,000 mg by mouth daily as needed for moderate pain.     albuterol 108 (90 Base) MCG/ACT inhaler  Commonly known as:  PROVENTIL HFA;VENTOLIN HFA  Inhale 2 puffs into the lungs every 6 (six) hours as needed for wheezing or shortness of breath.     aspirin EC 81 MG tablet  Take 1 tablet (81 mg total) by mouth daily.     azithromycin 250 MG tablet  Commonly known as:  ZITHROMAX  PO daily x 4 days     buPROPion 300 MG 24 hr tablet  Commonly known as:  WELLBUTRIN XL  Take 300 mg by mouth daily.     clopidogrel 75 MG tablet  Commonly known as:  PLAVIX  Take 1 tablet (75 mg total) by mouth daily.     docusate sodium 100 MG capsule  Commonly known as:  COLACE  Take 100 mg by mouth daily.     fish oil-omega-3 fatty acids 1000 MG capsule  Take 1 g by mouth 2 (two) times daily.     fluticasone 50 MCG/ACT nasal spray  Commonly  known as:  FLONASE  Place 2 sprays into both nostrils daily.     fluticasone furoate-vilanterol 100-25 MCG/INH Aepb  Commonly known as:  BREO ELLIPTA  Inhale 1 puff into the lungs daily.     guaiFENesin 600 MG 12 hr tablet  Commonly known as:  MUCINEX  Take 2 tablets (1,200 mg total) by mouth 2 (two) times daily.     insulin lispro 100 UNIT/ML injection  Commonly known as:  HUMALOG  Inject 3 Units into the skin 3 (three) times daily with meals as needed for high blood sugar. Inject  3 units subcutaneous as needed when blood sugar is high per patient     ipratropium-albuterol 0.5-2.5 (3) MG/3ML Soln  Commonly known as:  DUONEB  Take 3 mLs by nebulization every 6 (six) hours.     ipratropium-albuterol 0.5-2.5 (3) MG/3ML Soln  Commonly known as:  DUONEB  Take 3 mLs by nebulization every 4 (four) hours as needed.     loratadine 10 MG tablet  Commonly known as:  CLARITIN  Take 1 tablet (10 mg total) by mouth daily.     methylPREDNISolone 4 MG Tbpk tablet  Commonly known as:  MEDROL DOSEPAK  follow package directions     MULTIVITAMIN & MINERAL PO  Take 1 tablet by mouth daily.     nitroGLYCERIN 0.4 MG/SPRAY spray  Commonly known as:  NITROLINGUAL  Place 1 spray under the tongue every 5 (five) minutes as needed for chest pain.     omeprazole 20 MG capsule  Commonly known as:  PRILOSEC  Take 20 mg by mouth daily.     rosuvastatin 20 MG tablet  Commonly known as:  CRESTOR  Take 20 mg by mouth daily.     sodium chloride 0.65 % Soln nasal spray  Commonly known as:  OCEAN  Place 1 spray into both nostrils as needed for congestion.        Major procedures and Radiology Reports - PLEASE review detailed and final reports for all details, in brief -   L Heart cath   1. Prox LAD to Mid LAD lesion, 99% stenosed. Mid LAD Cypher DES stent (2003), 60% stenosed with 80% stenosis distal to stent. Post intervention, there is a 0% residual stenosis. The lesion was previously treated  with a drug-eluting stent greater than two years ago. 2. Ost RPDA lesion, 90% stenosed. Post POBA Only intervention, there is a 15% residual stenosis. 3. Prox-Mid RCA Express BMS Stents (2003) patent with 40% mid & 40% distal ISR. 4. Prox Cx to Mid Cx NIR BMS (2001) 20% stenosed. 2nd Mrg-1 NIR BMS (1999) 5% stenosed. 2nd Mrg-2 lesion, 25% stenosed. Beyond the stent 5. Normal LVEDP. Relatively normal EF by nuclear stress test. LV gram not performed   Severe disease upstream and downstream from the LAD stent with moderate in-stent restenosis in the LAD. These lesions were covered in tandem with a single DES stent.  Also severe ostial to proximal RPDA stenosis treated with PTCA/POBA (plain old balloon angioplasty) only   Plan / Recommendations:  Transferred to 6 Central post procedure unit for TR band removal.  She is artery on aspirin and Plavix. Continue essentially lifelong.  Smoking cessation counseling  Continue to address cardiac risk factors. - Low blood pressure occludes the ability to treat with any significant dose of beta blocker or ACE inhibitor.  From a cardiac standpoint, she would be ready for discharge as early as tomorrow. Will defer to [facility] for management of COPD.   Leonie Man, M.D., M.S. Interventional Cardiologist   Dg Chest 2 View  10/05/2015  CLINICAL DATA:  Shortness of breath, cough, and fever EXAM: CHEST  2 VIEW COMPARISON:  August 16, 2015 FINDINGS: No pneumothorax. The heart, hila, and mediastinum are unchanged. Increased interstitial/reticular markings are again seen, primarily in the bases, not significantly changed. No confluent infiltrate is identified. No other interval changes. IMPRESSION: Chronic interstitial changes in the lungs with no acute infiltrate identified. Electronically Signed   By: Dorise Bullion III M.D   On: 10/05/2015 14:15    Micro Results      Recent Results (from the past 240 hour(s))  MRSA PCR Screening      Status: Abnormal   Collection Time: 10/05/15  8:59 PM  Result Value Ref Range Status   MRSA by PCR POSITIVE (A) NEGATIVE Final    Comment:        The GeneXpert MRSA Assay (FDA approved for NASAL specimens only), is one component of a comprehensive MRSA colonization surveillance program. It is not intended to diagnose MRSA infection nor to guide or monitor treatment for MRSA infections. RESULT CALLED TO, READ BACK BY AND VERIFIED WITH: L OAGR,RN '@[id number]'$  10/06/15 MKELLY   Respiratory Panel by PCR  Status: None   Collection Time: 10/05/15 11:10 PM  Result Value Ref Range Status   Adenovirus NOT DETECTED NOT DETECTED Final   Coronavirus 229E NOT DETECTED NOT DETECTED Final   Coronavirus HKU1 NOT DETECTED NOT DETECTED Final   Coronavirus NL63 NOT DETECTED NOT DETECTED Final   Coronavirus OC43 NOT DETECTED NOT DETECTED Final   Metapneumovirus NOT DETECTED NOT DETECTED Final   Rhinovirus / Enterovirus NOT DETECTED NOT DETECTED Final   Influenza A NOT DETECTED NOT DETECTED Final   Influenza A H1 NOT DETECTED NOT DETECTED Final   Influenza A H1 2009 NOT DETECTED NOT DETECTED Final   Influenza A H3 NOT DETECTED NOT DETECTED Final   Influenza B NOT DETECTED NOT DETECTED Final   Parainfluenza Virus 1 NOT DETECTED NOT DETECTED Final   Parainfluenza Virus 2 NOT DETECTED NOT DETECTED Final   Parainfluenza Virus 3 NOT DETECTED NOT DETECTED Final   Parainfluenza Virus 4 NOT DETECTED NOT DETECTED Final   Respiratory Syncytial Virus NOT DETECTED NOT DETECTED Final   Bordetella pertussis NOT DETECTED NOT DETECTED Final   Chlamydophila pneumoniae NOT DETECTED NOT DETECTED Final   Mycoplasma pneumoniae NOT DETECTED NOT DETECTED Final  Culture, sputum-assessment     Status: None   Collection Time: 10/07/15  1:45 AM  Result Value Ref Range Status   Specimen Description EXPECTORATED SPUTUM  Final   Special Requests NONE  Final   Sputum evaluation   Final    THIS SPECIMEN IS ACCEPTABLE.  RESPIRATORY CULTURE REPORT TO FOLLOW.   Report Status 10/07/2015 FINAL  Final       Today   Subjective    Brenda [name] today has no headache,no chest abdominal pain,no new weakness tingling or numbness, feels much better wants to go home today.     Objective   Blood pressure 125/60, pulse 66, temperature 97.1 F (36.2 C), temperature source Oral, resp. rate 17, height '5\' 6"'$  (1.676 m), weight 54.8 kg (120 lb 13 oz), SpO2 100 %.   Intake/Output Summary (Last 24 hours) at 10/07/15 0829 Last data filed at 10/07/15 0800  Gross per 24 hour  Intake 2011.84 ml  Output    500 ml  Net 1511.84 ml    Exam Awake Alert, Oriented x 3, No new F.N deficits, Normal affect [place].AT,PERRAL Supple Neck,No JVD, No cervical lymphadenopathy appriciated.  Symmetrical Chest wall movement, Good air movement bilaterally, CTAB RRR,No Gallops,Rubs or new Murmurs, No Parasternal Heave +ve B.Sounds, Abd Soft, Non tender, No organomegaly appriciated, No rebound -guarding or rigidity. No Cyanosis, Clubbing or edema, No new Rash or bruise   Data Review   CBC w Diff: Lab Results  Component Value Date   WBC 4.9 10/07/2015   WBC 5.1 08/21/2012   HGB 10.8* 10/07/2015   HGB 15.6 08/21/2012   HCT 33.6* 10/07/2015   HCT 44.6 08/21/2012   PLT 143* 10/07/2015   PLT 192 08/21/2012   LYMPHOPCT 6 10/05/2015   LYMPHOPCT 10.9* 08/21/2012   MONOPCT 6 10/05/2015   MONOPCT 6.0 08/21/2012   EOSPCT 0 10/05/2015   EOSPCT 1.0 08/21/2012   BASOPCT 1 10/05/2015   BASOPCT 1.1 08/21/2012    CMP: Lab Results  Component Value Date   NA 137 10/07/2015   NA 136 08/21/2012   NA 139 08/20/2011   K 4.0 10/07/2015   K 4.1 08/21/2012   K 4.1 08/20/2011   CL 102 10/07/2015   CL 100 08/21/2012   CL 90* 08/20/2011   CO2 27 10/07/2015  CO2 28 08/21/2012   CO2 31 08/20/2011   BUN 15 10/07/2015   BUN 14.6 08/21/2012   BUN 11 08/20/2011   CREATININE 0.70 10/07/2015   CREATININE 0.69 10/02/2015   CREATININE 1.3*  08/21/2012   PROT 6.9 10/05/2015   PROT 6.8 08/21/2012   PROT 6.9 08/20/2011   ALBUMIN 3.7 10/05/2015   ALBUMIN 3.4* 08/21/2012   ALBUMIN 3.6 08/20/2011   BILITOT 1.0 10/05/2015   BILITOT 0.49 08/21/2012   BILITOT 0.70 08/20/2011   ALKPHOS 135* 10/05/2015   ALKPHOS 153* 08/21/2012   ALKPHOS 112* 08/20/2011   AST 22 10/05/2015   AST 18 08/21/2012   AST 24 08/20/2011   ALT 18 10/05/2015   ALT 22 08/21/2012   ALT 30 08/20/2011  .   Total Time in preparing paper work, data evaluation and todays exam - 35 minutes  Thurnell Lose M.D on 10/07/2015 at [place]  [phone]

## 2015-10-07 NOTE — Progress Notes (Signed)
CARDIAC REHAB PHASE I   PRE:  Rate/Rhythm: 72 SR  BP:  Supine:   Sitting:114/66   Standing:    SaO2: 99 2 L of O2  MODE:  Ambulation: 500 ft   POST:  Rate/Rhythm: 80 SR  BP:  Supine:   Sitting: 129/54   SaO2: 96 SR Tolerated ambulation without chest pain, but she did experience leg pain which is not new to her.  She required stopping to rest when her pain reached a 5 and the began when pain subsided.  Many problems to address.  Concentrated on smoking cassation, home exercise guidelines, weight gain.  Referred to phase II cardiac rehab in [place].   [phone] Liliane Channel RN, BSN 10/07/2015 9:57 AM

## 2015-10-07 NOTE — Progress Notes (Signed)
Home health brought a nebulizer machine for patient.  Patient states she already has one at home and knows how to use it.

## 2015-10-07 NOTE — Progress Notes (Signed)
Patient ID: Brenda [name], female   DOB: 05/15/1951, 65 y.o.   MRN: [id number]    Patient Name: Brenda [name] Date of Encounter: 10/07/2015     Principal Problem:   Acute on chronic respiratory failure with hypoxia [facility]) Active Problems:   DM (diabetes mellitus), type 2, uncontrolled ([place])   TIA (transient ischemic attack)-2013   History of CVA (cerebrovascular accident)   Tobacco abuse   Hyperlipidemia with target LDL less than 70   PVD-Hx of BFBPG '99 and '00. Dopplers OK 6/13   Claudication in peripheral vascular disease ([place])   CAD: Stents in p-mRCA, p & m Cx, p-mLAD   COPD (chronic obstructive pulmonary disease) ([name])   COPD exacerbation ([name])   Angina, class III ([name])   Positive cardiac stress test   Abnormal nuclear stress test - HIGH RISK   Protein-calorie malnutrition, moderate ([name])   Depression   GERD (gastroesophageal reflux disease)   CAD S/P percutaneous coronary angioplasty   Angina pectoris ([name])    SUBJECTIVE  No chest pain or sob after PCI/Stent LAD and POBA RCA(PDA)  CURRENT MEDS . aspirin EC  81 mg Oral Daily  . azithromycin  250 mg Oral Daily  . buPROPion  300 mg Oral Daily  . Chlorhexidine Gluconate Cloth  6 each Topical Q0600  . clopidogrel  75 mg Oral Daily  . docusate sodium  100 mg Oral Daily  . enoxaparin (LOVENOX) injection  40 mg Subcutaneous Q24H  . feeding supplement (GLUCERNA SHAKE)  237 mL Oral BID BM  . fluticasone  2 spray Each Nare Daily  . fluticasone furoate-vilanterol  1 puff Inhalation Daily  . guaiFENesin  1,200 mg Oral BID  . insulin aspart  0-5 Units Subcutaneous QHS  . insulin aspart  0-9 Units Subcutaneous TID WC  . loratadine  10 mg Oral Daily  . multivitamin with minerals  1 tablet Oral Daily  . mupirocin ointment  1 application Nasal BID  . omega-3 acid ethyl esters  1 g Oral BID  . pantoprazole  40 mg Oral Daily  . predniSONE  40 mg Oral Q breakfast  . rosuvastatin  20 mg Oral Daily  . sodium chloride flush  3  mL Intravenous Q12H    OBJECTIVE  Filed Vitals:   10/06/15 2300 10/07/15 0000 10/07/15 0130 10/07/15 0439  BP: 111/42 99/43 125/60   Pulse: 70 71 66   Temp:   97.1 F (36.2 C)   TempSrc:   Oral   Resp: '17 15 17   '$ Height:      Weight:    120 lb 13 oz (54.8 kg)  SpO2: 100% 100% 100%     Intake/Output Summary (Last 24 hours) at 10/07/15 0756 Last data filed at 10/07/15 0135  Gross per 24 hour  Intake 1891.84 ml  Output    500 ml  Net 1391.84 ml   Filed Weights   10/05/15 1337 10/06/15 0559 10/07/15 0439  Weight: 124 lb (56.246 kg) 120 lb (54.432 kg) 120 lb 13 oz (54.8 kg)    PHYSICAL EXAM  General: Pleasant, NAD. Neuro: Alert and oriented X 3. Moves all extremities spontaneously. Psych: Normal affect. HEENT:  Normal  Neck: Supple without bruits or JVD. Lungs:  Resp regular and unlabored, CTA. Heart: RRR no s3, s4, or murmurs. Abdomen: Soft, non-tender, non-distended, BS + x 4.  Extremities: No clubbing, cyanosis or edema. DP/PT/Radials 2+ and equal bilaterally.  Accessory Clinical Findings  CBC  Recent Labs  10/05/15 1420 10/07/15  0422  WBC 7.5 4.9  NEUTROABS 6.6  --   HGB 14.0 10.8*  HCT 41.2 33.6*  MCV 90.4 89.8  PLT 152 575*   Basic Metabolic Panel  Recent Labs  10/05/15 1420 10/07/15 0422  NA 132* 137  K 4.2 4.0  CL 94* 102  CO2 31 27  GLUCOSE 327* 168*  BUN 12 15  CREATININE 0.78 0.70  CALCIUM 9.0 8.5*   Liver Function Tests  Recent Labs  10/05/15 1420  AST 22  ALT 18  ALKPHOS 135*  BILITOT 1.0  PROT 6.9  ALBUMIN 3.7   No results for input(s): LIPASE, AMYLASE in the last 72 hours. Cardiac Enzymes  Recent Labs  10/05/15 1420 10/06/15 0500 10/06/15 1407  TROPONINI <0.03 <0.03 0.03   BNP Invalid input(s): POCBNP D-Dimer No results for input(s): DDIMER in the last 72 hours. Hemoglobin A1C  Recent Labs  10/06/15 0500  HGBA1C 8.9*   Fasting Lipid Panel  Recent Labs  10/06/15 0500  CHOL 109  HDL 65  LDLCALC 36    TRIG 42  CHOLHDL 1.7   Thyroid Function Tests No results for input(s): TSH, T4TOTAL, T3FREE, THYROIDAB in the last 72 hours.  Invalid input(s): FREET3  TELE  NSR  Radiology/Studies  Dg Chest 2 View  10/05/2015  CLINICAL DATA:  Shortness of breath, cough, and fever EXAM: CHEST  2 VIEW COMPARISON:  August 16, 2015 FINDINGS: No pneumothorax. The heart, hila, and mediastinum are unchanged. Increased interstitial/reticular markings are again seen, primarily in the bases, not significantly changed. No confluent infiltrate is identified. No other interval changes. IMPRESSION: Chronic interstitial changes in the lungs with no acute infiltrate identified. Electronically Signed   By: Dorise Bullion III M.D   On: 10/05/2015 14:15    ASSESSMENT AND PLAN  1. [place] - she is s/p PCI/Stent of LAD and POBA of PDA - continue current meds as outlined by Dr. Ellyn Hack. 2. COPD - as per primary service. No wheezing on my exam today. 3. Disp. - ok for discharge from cardiology perspective. Followup in 3-4 weeks. Please call for additional questions.  Gregg Taylor,M.D.  10/07/2015 7:56 AM

## 2015-10-07 NOTE — Discharge Instructions (Signed)
Follow with Primary MD [name], MD in 7 days   Get CBC, CMP, 2 view Chest X ray checked  by Primary MD next visit.    Activity: As tolerated with Full fall precautions use walker/cane & assistance as needed   Disposition Home    Diet:   Heart Healthy .  For Heart failure patients - Check your Weight same time everyday, if you gain over 2 pounds, or you develop in leg swelling, experience more shortness of breath or chest pain, call your Primary MD immediately. Follow Cardiac Low Salt Diet and 1.5 lit/day fluid restriction.   On your next visit with your primary care physician please Get Medicines reviewed and adjusted.   Please request your Prim.MD to go over all Hospital Tests and Procedure/Radiological results at the follow up, please get all Hospital records sent to your Prim MD by signing hospital release before you go home.   If you experience worsening of your admission symptoms, develop shortness of breath, life threatening emergency, suicidal or homicidal thoughts you must seek medical attention immediately by calling 911 or calling your MD immediately  if symptoms less severe.  You Must read complete instructions/literature along with all the possible adverse reactions/side effects for all the Medicines you take and that have been prescribed to you. Take any new Medicines after you have completely understood and accpet all the possible adverse reactions/side effects.   Do not drive, operating heavy machinery, perform activities at heights, swimming or participation in water activities or provide baby sitting services if your were admitted for syncope or siezures until you have seen by Primary MD or a Neurologist and advised to do so again.  Do not drive when taking Pain medications.    Do not take more than prescribed Pain, Sleep and Anxiety Medications  Special Instructions: If you have smoked or chewed Tobacco  in the last 2 yrs please stop smoking, stop any regular  Alcohol  and or any Recreational drug use.  Wear Seat belts while driving.   Please note  You were cared for by a hospitalist during your hospital stay. If you have any questions about your discharge medications or the care you received while you were in the hospital after you are discharged, you can call the unit and asked to speak with the hospitalist on call if the hospitalist that took care of you is not available. Once you are discharged, your primary care physician will handle any further medical issues. Please note that NO REFILLS for any discharge medications will be authorized once you are discharged, as it is imperative that you return to your primary care physician (or establish a relationship with a primary care physician if you do not have one) for your aftercare needs so that they can reassess your need for medications and monitor your lab values.

## 2015-10-09 ENCOUNTER — Encounter (HOSPITAL_COMMUNITY): Payer: Self-pay | Admitting: Cardiology

## 2015-10-10 ENCOUNTER — Telehealth: Payer: Self-pay | Admitting: Cardiology

## 2015-10-11 LAB — CULTURE, BLOOD (ROUTINE X 2)
CULTURE: NO GROWTH
Culture: NO GROWTH

## 2015-10-11 LAB — CULTURE, RESPIRATORY W GRAM STAIN

## 2015-10-11 LAB — CULTURE, RESPIRATORY

## 2015-10-13 ENCOUNTER — Other Ambulatory Visit: Payer: Self-pay | Admitting: Family Medicine

## 2015-10-13 ENCOUNTER — Ambulatory Visit
Admission: RE | Admit: 2015-10-13 | Discharge: 2015-10-13 | Disposition: A | Discharge status: Home / Self Care | Payer: Medicare Other | Source: Ambulatory Visit | Attending: Family Medicine | Admitting: Family Medicine

## 2015-10-13 DIAGNOSIS — J441 Chronic obstructive pulmonary disease with (acute) exacerbation: Secondary | ICD-10-CM

## 2015-10-13 NOTE — Telephone Encounter (Signed)
Closed Encounter  °

## 2015-10-26 ENCOUNTER — Ambulatory Visit (INDEPENDENT_AMBULATORY_CARE_PROVIDER_SITE_OTHER): Payer: Medicare Other | Admitting: Physician Assistant

## 2015-10-26 ENCOUNTER — Encounter: Payer: Self-pay | Admitting: Physician Assistant

## 2015-10-26 ENCOUNTER — Other Ambulatory Visit: Payer: Self-pay | Admitting: Physician Assistant

## 2015-10-26 VITALS — BP 80/46 | HR 76 | Ht 66.0 in | Wt 124.0 lb

## 2015-10-26 DIAGNOSIS — I739 Peripheral vascular disease, unspecified: Secondary | ICD-10-CM

## 2015-10-26 DIAGNOSIS — E785 Hyperlipidemia, unspecified: Secondary | ICD-10-CM

## 2015-10-26 DIAGNOSIS — Z720 Tobacco use: Secondary | ICD-10-CM | POA: Diagnosis not present

## 2015-10-26 DIAGNOSIS — I25119 Atherosclerotic heart disease of native coronary artery with unspecified angina pectoris: Secondary | ICD-10-CM

## 2015-10-26 DIAGNOSIS — S81801D Unspecified open wound, right lower leg, subsequent encounter: Secondary | ICD-10-CM

## 2015-10-26 DIAGNOSIS — I959 Hypotension, unspecified: Secondary | ICD-10-CM | POA: Insufficient documentation

## 2015-10-26 DIAGNOSIS — I950 Idiopathic hypotension: Secondary | ICD-10-CM

## 2015-10-26 NOTE — Patient Instructions (Addendum)
Your physician has requested that you have a lower extremity arterial duplex. This test is an ultrasound of the arteries in the legs. It looks at arterial blood flow in the legs. Allow one hour for Lower Arterial scans. There are no restrictions or special instructions  Keep appointment with Dr. Ellyn Hack.  Gaspar Bidding, PA recommends that you eat soup today and drink an extra liter of fluid

## 2015-10-26 NOTE — Progress Notes (Addendum)
Patient ID: Brenda [name], female   DOB: 30-Nov-1950, 65 y.o.   MRN: [id number]    Date:  10/26/2015   ID:  Brenda [name], DOB Mar 11, 1951, MRN [id number]  PCP:  [name], MD  Primary Cardiologist:  Ellyn Hack Vascular: Dr.  Donnetta Hutching  Chief Complaint  Patient presents with  . Follow-up    chest pain in rt side of chest, Lt leg swelling, cramping in legs     History of Present Illness: Brenda [name] is a 65 y.o. female with past medical history of HLD, DM, COPD on nighttime 3L O2, long-standing tobacco abuse, CAD, PAD s/p bil femoropopliteal 1999/2000 with revision of right femoropopliteal in 2014, TIA/CVA, and history of carotid artery disease who had recently abnormal Myoview with ischemia in LAD distribution was originally scheduled for outpatient cath on 5/5, she came in one day prior with fever and productive cough and diagnosed with COPD exacerbation.  She underwent heart catheterization on May 5 which revealed:  1. Prox LAD to Mid LAD lesion, 99% stenosed. Mid LAD Cypher DES stent (2003), 60% stenosed with 80% stenosis distal to stent. Post intervention, there is a 0% residual stenosis. The lesion was previously treated with a drug-eluting stent greater than two years ago. 2. Ost RPDA lesion, 90% stenosed. Post POBA Only intervention, there is a 15% residual stenosis. 3. Prox-Mid RCA Express BMS Stents (2003) patent with 40% mid & 40% distal ISR. 4. Prox Cx to Mid Cx NIR BMS (2001) 20% stenosed. 2nd Mrg-1 NIR BMS (1999) 5% stenosed. 2nd Mrg-2 lesion, 25% stenosed. Beyond the stent 5. Normal LVEDP. Relatively normal EF by nuclear stress test. LV gram not performed  She underwent stenting with a single DES. Aspirin Plavix. Smoking cessation advised.    She presents for posthospital follow-up.  She reports some right lateral chest pain with cough but otherwise doesn't have any pressure she did previously. She also reports worsening claudication on the right leg in the last month. She  can only walk about 20 feet before it becomes severe. She does a lot of walking at [facility] and has to lean on the cart. She is taking aspirin and Plavix.  The patient currently denies nausea, vomiting, fever, shortness of breath, orthopnea, dizziness, PND, cough, congestion, abdominal pain, hematochezia, melena, lower extremity edema,   Post-Intervention Diagram          Wt Readings from Last 3 Encounters:  10/26/15 124 lb (56.246 kg)  10/07/15 120 lb 13 oz (54.8 kg)  09/26/15 124 lb (56.246 kg)     Past Medical History  Diagnosis Date  . History of DVT (deep vein thrombosis)   . Coronary artery disease     multiple stens  . COPD (chronic obstructive pulmonary disease) ([place])   . Transient ischemic attack   . Depression   . Hx of radiation therapy 01/16/09 - 03/06/09    base of tongue, right neck node  . Peripheral arterial disease ([name])     status post bilateral femoropopliteal bypass graft performed by Dr. Sherren Mocha early in the past with significant right lower extremity lifestyle limiting claudication  . Pneumonia   . Hypercholesteremia   . Chronic bronchitis ([place])   . Shortness of breath     "w/exertion and sometimes when laying down" (11/26/2012)  . Type II diabetes mellitus ([place])   . History of blood transfusion 1955  . GERD (gastroesophageal reflux disease)   . H/O hiatal hernia   . Stroke [facility]) 2000    left  side weakness remains (11/26/2012)  . Arthritis     "hands" (11/26/2012)  . Cancer of base of tongue ([place]) 11/04/2008    "& lymph nodes @ right neck" (11/26/2012  . Myocardial infarction [facility]) 2005    dr Ellyn Hack   . Fatigue 08/23/2013  . Carotid artery occlusion     Current Outpatient Prescriptions  Medication Sig Dispense Refill  . acetaminophen (TYLENOL) 500 MG tablet Take 1,000 mg by mouth daily as needed for moderate pain.     Marland Kitchen albuterol (PROVENTIL HFA;VENTOLIN HFA) 108 (90 BASE) MCG/ACT inhaler Inhale 2 puffs into the lungs every 6 (six) hours as needed for  wheezing or shortness of breath. 1 Inhaler 0  . aspirin EC 81 MG tablet Take 1 tablet (81 mg total) by mouth daily. 30 tablet 10  . buPROPion (WELLBUTRIN XL) 300 MG 24 hr tablet Take 300 mg by mouth daily.     . clopidogrel (PLAVIX) 75 MG tablet Take 1 tablet (75 mg total) by mouth daily. 30 tablet 12  . docusate sodium (COLACE) 100 MG capsule Take 100 mg by mouth daily.    . fish oil-omega-3 fatty acids 1000 MG capsule Take 1 g by mouth 2 (two) times daily.     . fluticasone (FLONASE) 50 MCG/ACT nasal spray Place 2 sprays into both nostrils daily. 16 g 2  . fluticasone furoate-vilanterol (BREO ELLIPTA) 100-25 MCG/INH AEPB Inhale 1 puff into the lungs daily. 60 each 5  . guaiFENesin (MUCINEX) 600 MG 12 hr tablet Take 2 tablets (1,200 mg total) by mouth 2 (two) times daily. 60 tablet 3  . insulin lispro (HUMALOG) 100 UNIT/ML injection Inject 3 Units into the skin 3 (three) times daily with meals as needed for high blood sugar. Inject  3 units subcutaneous as needed when blood sugar is high per patient    . ipratropium-albuterol (DUONEB) 0.5-2.5 (3) MG/3ML SOLN Take 3 mLs by nebulization every 6 (six) hours. (Patient taking differently: Take 3 mLs by nebulization 2 (two) times daily as needed (for wheezingt or shortness of breath). ) 360 mL 3  . ipratropium-albuterol (DUONEB) 0.5-2.5 (3) MG/3ML SOLN Take 3 mLs by nebulization every 4 (four) hours as needed. 360 mL 0  . loratadine (CLARITIN) 10 MG tablet Take 1 tablet (10 mg total) by mouth daily. 30 tablet 0  . Multiple Vitamins-Minerals (MULTIVITAMIN & MINERAL PO) Take 1 tablet by mouth daily.    . nitroGLYCERIN (NITROLINGUAL) 0.4 MG/SPRAY spray Place 1 spray under the tongue every 5 (five) minutes as needed for chest pain. 12 g 0  . omeprazole (PRILOSEC) 20 MG capsule Take 20 mg by mouth daily.     . rosuvastatin (CRESTOR) 20 MG tablet Take 20 mg by mouth daily.     No current facility-administered medications for this visit.    Allergies:      Allergies  Allergen Reactions  . Tape Hives and Other (See Comments)    USE PAPER TAPE ONLY- Adhesive peels off skin-makes pt. Raw.  . Isosorbide Other (See Comments)    Drops BP too low  . Nicoderm [Nicotine] Rash    To the PATCH only, breakouts on skin    Social History:  The patient  reports that she has been smoking Cigarettes.  She has a 50 pack-year smoking history. She has never used smokeless tobacco. She reports that she drinks about 3.0 oz of alcohol per week. She reports that she does not use illicit drugs.   Family history:   Family History  Problem Relation Age of Onset  . Cancer Mother     pancreatic  . Diabetes Mother   . Hyperlipidemia Mother   . Hypertension Mother   . Other Mother     varicose vein  . Cancer Father 72    throat  . Heart disease Father   . Heart attack Father   . Cancer Sister     breast  . Diabetes Sister   . Deep vein thrombosis Brother   . Diabetes Brother   . Hearing loss Brother   . Hyperlipidemia Brother   . Hypertension Brother   . Heart attack Brother   . Clotting disorder Brother   . Diabetes Son   . Hyperlipidemia Son     ROS:  Please see the history of present illness.  All other systems reviewed and negative.   PHYSICAL EXAM: VS:  BP 80/46 mmHg  Pulse 76  Ht '5\' 6"'$  (1.676 m)  Wt 124 lb (56.246 kg)  BMI 20.02 kg/m2 Thin appearing well developed, in no acute distress HEENT: Pupils are equal round react to light accommodation extraocular movements are intact.  Neck: no JVDNo cervical lymphadenopathy. Cardiac: Regular rate and rhythm without murmurs rubs or gallops. Lungs:  Decreased breath sounds bilaterally, no wheezing, rhonchi or rales Abd: soft, nontender, positive bowel sounds all quadrants, no hepatosplenomegaly Ext: 1+ left lower extremity edema which apparently is chronic for the last 2 years.  2+ radial and 2+ right DP 0 right posterior tibialis 1+ left DP and PT.  Both feet are cool to the touch.  Skin: warm  and dry. She has a slow healing wound mid lateral lower extremity, mild erythema. No edema or discharge. Neuro:  Grossly normal  EKG:  Normal sinus rhythm anterior lateral T-wave inversions rate 86 bpm   ASSESSMENT AND PLAN:  Problem List Items Addressed This Visit    Tobacco abuse (Chronic)   Peripheral vascular disease, unspecified ([name]) (Chronic)   Relevant Orders   EKG 12-Lead   VAS [place] LOWER EXTREMITY ARTERIAL DUPLEX   Hypotension   Hyperlipidemia with target LDL less than 70 (Chronic)   Claudication in peripheral vascular disease ([name]) (Chronic)   CAD: Stents in p-mRCA, p & m Cx, p-mLAD - Primary (Chronic)   Relevant Orders   EKG 12-Lead    Other Visit Diagnoses    Wound of right lower extremity, subsequent encounter        Relevant Orders    VAS [place] LOWER EXTREMITY ARTERIAL DUPLEX      From a cardiovascular standpoint Mr. seems to be doing well since she had stent and POBA on May 5 (see cath Diagram above).  Continue aspirin Plavix, statin. No beta blocker due to hypotension. She does report worsening claudication in the right leg in the last month. She can only walk approximately 20 feet before the pain becomes severe. She has to lean on the shopping cart quite a bit but she's at [employer]. She also has a wound on the right lower extremity lateral midshin which she got approximately 2 months ago. it is scabbed over with some erythema in that area no discharge or swelling.  We'll schedule lower extremity arterial Dopplers and copy Dr. Donnetta Hutching.   She is also hypotensive complaining of some mild dizziness. She denies any presyncope or syncope. She is on no blood pressure medications that we can discontinue. I've asked her to increase her sodium intake today by eating half a can of soup and taking some extra fluids. She  will follow-up as scheduled with Dr. Ellyn Hack on June 6

## 2015-11-03 ENCOUNTER — Ambulatory Visit (HOSPITAL_COMMUNITY)
Admission: RE | Admit: 2015-11-03 | Discharge: 2015-11-03 | Disposition: A | Discharge status: Home / Self Care | Payer: Medicare Other | Source: Ambulatory Visit | Attending: Cardiovascular Disease | Admitting: Cardiovascular Disease

## 2015-11-03 DIAGNOSIS — F329 Major depressive disorder, single episode, unspecified: Secondary | ICD-10-CM | POA: Insufficient documentation

## 2015-11-03 DIAGNOSIS — X58XXXD Exposure to other specified factors, subsequent encounter: Secondary | ICD-10-CM | POA: Insufficient documentation

## 2015-11-03 DIAGNOSIS — R938 Abnormal findings on diagnostic imaging of other specified body structures: Secondary | ICD-10-CM | POA: Diagnosis not present

## 2015-11-03 DIAGNOSIS — K219 Gastro-esophageal reflux disease without esophagitis: Secondary | ICD-10-CM | POA: Insufficient documentation

## 2015-11-03 DIAGNOSIS — I739 Peripheral vascular disease, unspecified: Secondary | ICD-10-CM | POA: Insufficient documentation

## 2015-11-03 DIAGNOSIS — I2510 Atherosclerotic heart disease of native coronary artery without angina pectoris: Secondary | ICD-10-CM | POA: Insufficient documentation

## 2015-11-03 DIAGNOSIS — S81801D Unspecified open wound, right lower leg, subsequent encounter: Secondary | ICD-10-CM

## 2015-11-03 DIAGNOSIS — E119 Type 2 diabetes mellitus without complications: Secondary | ICD-10-CM | POA: Insufficient documentation

## 2015-11-03 DIAGNOSIS — E7800 Pure hypercholesterolemia, unspecified: Secondary | ICD-10-CM | POA: Insufficient documentation

## 2015-11-07 ENCOUNTER — Encounter: Payer: Self-pay | Admitting: Cardiology

## 2015-11-07 ENCOUNTER — Ambulatory Visit (INDEPENDENT_AMBULATORY_CARE_PROVIDER_SITE_OTHER): Payer: Medicare Other | Admitting: Cardiology

## 2015-11-07 VITALS — BP 112/64 | HR 86 | Ht 66.0 in | Wt 123.8 lb

## 2015-11-07 DIAGNOSIS — I2510 Atherosclerotic heart disease of native coronary artery without angina pectoris: Secondary | ICD-10-CM

## 2015-11-07 DIAGNOSIS — Z720 Tobacco use: Secondary | ICD-10-CM

## 2015-11-07 DIAGNOSIS — I209 Angina pectoris, unspecified: Secondary | ICD-10-CM | POA: Diagnosis not present

## 2015-11-07 DIAGNOSIS — Z9861 Coronary angioplasty status: Secondary | ICD-10-CM

## 2015-11-07 DIAGNOSIS — L97911 Non-pressure chronic ulcer of unspecified part of right lower leg limited to breakdown of skin: Secondary | ICD-10-CM | POA: Diagnosis not present

## 2015-11-07 DIAGNOSIS — E440 Moderate protein-calorie malnutrition: Secondary | ICD-10-CM

## 2015-11-07 DIAGNOSIS — I951 Orthostatic hypotension: Secondary | ICD-10-CM

## 2015-11-07 DIAGNOSIS — I25119 Atherosclerotic heart disease of native coronary artery with unspecified angina pectoris: Secondary | ICD-10-CM | POA: Diagnosis not present

## 2015-11-07 DIAGNOSIS — E785 Hyperlipidemia, unspecified: Secondary | ICD-10-CM

## 2015-11-07 DIAGNOSIS — I739 Peripheral vascular disease, unspecified: Secondary | ICD-10-CM

## 2015-11-07 NOTE — Patient Instructions (Signed)
ENCOURAGE TO EAT  AND DRINK AS MUCH MILKSHAKES AND SMOOTHIES ADD CARNATION INSTANT BREAKFAST  DRINK TO YOUR MIK SHAKES  DRINK ENOUGH WATER EVERYDAY - SO THAT YOUR URINE IS CLEAR  You have been referred to  DR EARLY - sooner than AUGUST - DUE TO ULCER ON RIGHT LEG, CLAUDICATIONS  Your physician wants you to follow-up in [place] DR HARDING.  You will receive a reminder letter in the mail two months in advance. If you don't receive a letter, please call our office to schedule the follow-up appointment.  If you need a refill on your cardiac medications before your next appointment, please call your pharmacy.

## 2015-11-07 NOTE — Progress Notes (Signed)
PCP: [name], MD  Clinic Note: Chief Complaint  Patient presents with  . Follow-up    per Tarri Fuller, PA-C --CATH w/ Stent Intervention pt c/o SOB (COPD); occasional dizziness when BP drops too low--started just in the last 2-3 weeks; mild swelling in left foot/ankle    HPI: Brenda [name] is a 65 y.o. female with a PMH below who presents today for routine follow-up post PCI. She has a history of CAD and PAD/PVOD (s/p Bilateral Fem-Pop bypass with revision of R Fem-Pop to below-knee pop in Sept 2014 - Dr. Donnetta Hutching), TIA/CVA with carotid disease & COPD.  She followed back up with me in early April after a long hiatus from our practice. At that time she was noted to have concerning symptoms for progressive exertional angina. She was evaluated with a Myoview stress test that was abnormal. She she then came in for cardiac catheterization lab with PCI of the LAD and POBA in ostial PDA.  KYANN HEYDT was last seen on May 25 by Tarri Fuller, PA-C for post- Follow-up.  Because she was complaining of claudication, he ordered lower extremity arterial Dopplers/ABI's  Recent Hospitalizations: Cardiac catheterization with PTCA  Studies Reviewed:  Procedures - 10/06/2015   Coronary Balloon Angioplasty   Coronary Stent Intervention   Left Heart Cath and Coronary Angiography    Conclusion     Prox LAD to Mid LAD lesion, 99% stenosed. Mid LAD Cypher DES stent (2003), 60% stenosed with 80% stenosis distal to stent. Post intervention - Promus DES 2.75 x 38), there is a 0% residual stenosis. The lesion was previously treated with a drug-eluting stent greater than two years ago.  Ost RPDA lesion, 90% stenosed. Post POBA Only intervention, there is a 15% residual stenosis.  Prox-Mid RCA Express BMS Stents (2003) patent with 40% mid & 40% distal ISR.  Prox Cx to Mid Cx NIR BMS (2001) 20% stenosed. 2nd Mrg-1 NIR BMS (1999) 5% stenosed. 2nd Mrg-2 lesion, 25% stenosed. Beyond the stent  Normal  LVEDP. Relatively normal EF by nuclear stress test. LV gram not performed             LEA Dopplers 11/03/2015:   Right ABI has increased since prior exam, now in the moderate range of flow reduction.  Left ABI is stable in the normal range.  Bilateral great toe-brachial indices are abnormal.  Interval History: Brenda [name] presents today noting that she is not having any more of her exertional anginal type symptoms. Still has some exertional dyspnea from her COPD/emphysema and chronic bronchitis. She denies any PND, orthopnea or edema however. Mainly she notes is that her blood pressures been dropping of late, and feels dizzy when this happens. The other thing that has been bothering her is claudication on the right leg greater than left. She actually also has a nickel sized skin deep ulcer in the right lower leg is been hurting her quite a bit. Doesn't appear to be infected only mild erythema around it. She's not had any fevers or chills and no sweats.  She has some strange twinging type sensation on the left side of her chest underneath her left breast. This is very different than her precath anginal type pain.  Since her PCI/PTCA, she has not had any more recurrence of her anginal symptoms. Otherwise from a cardiac standpoint, she is relatively stable: No palpitations, syncope/near syncope. No TIA/amaurosis fugax symptoms. No melena, hematochezia, hematuria, or epstaxis. No claudication.  ROS: A comprehensive was performed. Review of  Systems  Constitutional: Positive for malaise/fatigue (Always seems retired and Chiropodist.).  HENT: Negative for ear discharge and nosebleeds.   Eyes: Negative for blurred vision.  Respiratory: Positive for cough, sputum production, shortness of breath and wheezing.        Maybe just a little bit off from baseline. But pretty normal for her.  Cardiovascular: Positive for claudication (Right leg worse than left). Negative for leg swelling.  Gastrointestinal:  Negative for blood in stool and melena.  Genitourinary: Negative for hematuria.  Musculoskeletal: Negative for myalgias, back pain, joint pain and neck pain.  Skin:       Right lower leg on the external aspect: Nickel sized dermis deep ulcer with mild erythema around it. Tender to palpation around it did not warm to touch.  Neurological: Positive for dizziness (When blood pressure goes down. Positional.). Negative for headaches.  Psychiatric/Behavioral: Positive for depression and memory loss (Exacerbated by anxiety and nervousness.). The patient is nervous/anxious.     Past Medical History  Diagnosis Date  . CAD S/P percutaneous coronary angioplasty 1999; 2001;2003, 10/2014    a) BMS- mCx; b) re-do PCI for prog of Dz (NIR BMS 3.5 x 12); c) staged PCI: p-mRCA Express II BMS 3.0x24 & 3.5 x 16 --> p-mLAD Cyper DES 3.0 x 13 & PTCA of D2 (2.5 balloon); 10/2015 PCI p-m LAD Promus DES 2.75 x 38 (covers pre&post-stent ISR), POBA of ostial rPDA  . Myocardial infarction [facility]) 1999, 2003  . PAD (peripheral artery disease) ([name])     status post bilateral femoropopliteal bypass grafting --> Dr. Sherren Mocha Early; -  02/2013 Revision of R Fem-AKPop to BK Pop.  . Hypercholesteremia   . Type II diabetes mellitus ([place])   . Transient ischemic attack (TIA) February 2013  . Stroke [facility])     left side weakness remains (11/26/2012)  . Carotid artery occlusion   . History of DVT (deep vein thrombosis)   . COPD (chronic obstructive pulmonary disease) with emphysema ([place])   . Chronic bronchitis ([place])   . Pneumonia   . Depression   . Hx of radiation therapy   . History of blood transfusion   . GERD (gastroesophageal reflux disease)   . H/O hiatal hernia   . Arthritis     "hands" (11/26/2012)  . Cancer of base of tongue ([place]) 2010    "& lymph nodes @ right neck" (11/26/2012  . Hx of radiation therapy 01/16/09 - 03/06/09    base of tongue, right neck node  . Patent foramen ovale February 2013    Small. Small PFO noted  on TTE for TIA/CVA R-L + Bubble study with valsalva    Past Surgical History  Procedure Laterality Date  . Tee without cardioversion  07/26/2011    Procedure: TRANSESOPHAGEAL ECHOCARDIOGRAM (TEE);  Surgeon: Pixie Casino, MD;  [facility] ENDOSCOPY;  Service: Cardiovascular;  Laterality: N/A;  . Esophagogastroduodenoscopy  12/20/2011    Procedure: ESOPHAGOGASTRODUODENOSCOPY (EGD);  Surgeon: Beryle Beams, MD;  Location: [facility] ENDOSCOPY;  Service: Endoscopy;  Laterality: N/A;  EGD with balloon dilation  . Savory dilation  12/20/2011    Procedure: SAVORY DILATION;  Surgeon: Beryle Beams, MD;  Location: [facility] ENDOSCOPY;  Service: Endoscopy;  Laterality: N/A;  . Tonsillectomy  1969  . Appendectomy  1962  . Cardiac catheterization  1999; 10/2005    5/'07: mildLAD 20-30%, patent stent, diags- no Dz. RCA 2 BMS patent ~20% ISR.   Marland Kitchen Coronary angioplasty with stent placement  1999--2005  [place]; 2001: p-mCx NIR BMS 3.5 x 12; 10/2001: p-mRCA Express II BMS - 3.5 x 16 - 3.0 x 24.   . Femoral bypass  1999; 2000    Bilateral Fem-AK POP bypass.   . Carpal tunnel release Right 1980's?  . Vaginal hysterectomy  1988  . Cesarean section  1978  . Tubal ligation  1988  . Cataract extraction w/ intraocular lens implant Right 2011  . Ankle fracture surgery Right 2007    "got a metal plate and 8 screws in it" (11/26/2012)  . Bypass graft popliteal to popliteal Right 02/05/2013    Procedure: BYPASS GRAFT ABOVE KNEE POPLITEAL TO BELOW KNEE POPLITEAL WITH SMALL SAPHANOUS VEIN;  Surgeon: Rosetta Posner, MD;  Location: [place];  Service: Vascular;  Laterality: Right;  . Eye surgery    . Cardiac catheterization N/A 10/06/2015    Procedure: Left Heart Cath and Coronary Angiography;  Surgeon: Leonie Man, MD;  Location: [place] CV LAB;  Service: Cardiovascular; p-mLAD 99% pre-stent, 60% ISR & 80% post-stent, o-RPDA 90%, ~10% ISR in 2 RCA (Prox & mid) BMS,~20% ISR in pCx BMS with 5% OM2 BMS.    . Cardiac  catheterization N/A 10/06/2015    Procedure: Coronary Stent Intervention;  Surgeon: Leonie Man, MD;  Location: [place] CV LAB;  Service: Cardiovascular; p-m LAD Promus DES 2.75 x 38 (tapered post-dilatoin)  . Cardiac catheterization N/A 10/06/2015    Procedure: Coronary Balloon Angioplasty;  Surgeon: Leonie Man, MD;  Location: [place] CV LAB;  Service: Cardiovascular;  POBA of Ostial rPDA.  . Transthoracic echocardiogram  March 2016    Normal LV size and function EF 55-60%. No regional wall motion and amount is. No significant valvular lesions.    Prior to Admission medications   Medication Sig Start Date End Date Taking? Authorizing Provider  acetaminophen (TYLENOL) 500 MG tablet Take 1,000 mg by mouth daily as needed for moderate pain.    Yes Historical Provider, MD  albuterol (PROVENTIL HFA;VENTOLIN HFA) 108 (90 BASE) MCG/ACT inhaler Inhale 2 puffs into the lungs every 6 (six) hours as needed for wheezing or shortness of breath. 01/20/15  Yes Belkys A Regalado, MD  aspirin EC 81 MG tablet Take 1 tablet (81 mg total) by mouth daily. 10/07/15  Yes Thurnell Lose, MD  buPROPion (WELLBUTRIN XL) 300 MG 24 hr tablet Take 300 mg by mouth daily.  09/19/15  Yes Historical Provider, MD  clopidogrel (PLAVIX) 75 MG tablet Take 1 tablet (75 mg total) by mouth daily. 10/07/15  Yes Thurnell Lose, MD  docusate sodium (COLACE) 100 MG capsule Take 100 mg by mouth daily.   Yes Historical Provider, MD  fish oil-omega-3 fatty acids 1000 MG capsule Take 1 g by mouth 2 (two) times daily.    Yes Historical Provider, MD  fluticasone (FLONASE) 50 MCG/ACT nasal spray Place 2 sprays into both nostrils daily. 06/02/15  Yes Debbe Odea, MD  fluticasone furoate-vilanterol (BREO ELLIPTA) 100-25 MCG/INH AEPB Inhale 1 puff into the lungs daily. 09/13/15  Yes Tammy S Parrett, NP  guaiFENesin (MUCINEX) 600 MG 12 hr tablet Take 2 tablets (1,200 mg total) by mouth 2 (two) times daily. 06/02/15  Yes Debbe Odea, MD    insulin lispro (HUMALOG) 100 UNIT/ML injection Inject 3 Units into the skin 3 (three) times daily with meals as needed for high blood sugar. Inject  3 units subcutaneous as needed when blood sugar is high per patient   Yes  Historical Provider, MD  ipratropium-albuterol (DUONEB) 0.5-2.5 (3) MG/3ML SOLN Take 3 mLs by nebulization every 6 (six) hours. Patient taking differently: Take 3 mLs by nebulization 2 (two) times daily as needed (for wheezingt or shortness of breath).  06/02/15  Yes Debbe Odea, MD  ipratropium-albuterol (DUONEB) 0.5-2.5 (3) MG/3ML SOLN Take 3 mLs by nebulization every 4 (four) hours as needed. 10/07/15  Yes Thurnell Lose, MD  loratadine (CLARITIN) 10 MG tablet Take 1 tablet (10 mg total) by mouth daily. 06/02/15  Yes Debbe Odea, MD  Multiple Vitamins-Minerals (MULTIVITAMIN & MINERAL PO) Take 1 tablet by mouth daily.   Yes Historical Provider, MD  nitroGLYCERIN (NITROLINGUAL) 0.4 MG/SPRAY spray Place 1 spray under the tongue every 5 (five) minutes as needed for chest pain. 01/20/15  Yes Belkys A Regalado, MD  omeprazole (PRILOSEC) 20 MG capsule Take 20 mg by mouth daily.    Yes Historical Provider, MD  rosuvastatin (CRESTOR) 20 MG tablet Take 20 mg by mouth daily.   Yes Historical Provider, MD   Allergies  Allergen Reactions  . Tape Hives and Other (See Comments)    USE PAPER TAPE ONLY- Adhesive peels off skin-makes pt. Raw.  . Isosorbide Other (See Comments)    Drops BP too low  . Nicoderm [Nicotine] Rash    To the PATCH only, breakouts on skin     Social History   Social History  . Marital Status: Married    Spouse Name: N/A  . Number of Children: N/A  . Years of Education: N/A   Occupational History  . house wife    Social History Main Topics  . Smoking status: Light Tobacco Smoker -- 1.00 packs/day for 50 years    Types: Cigarettes  . Smokeless tobacco: Never Used     Comment: down to .5ppd per pt 08/16/15  . Alcohol Use: 3.0 oz/week    5 Glasses of  wine per week  . Drug Use: No  . Sexual Activity: Yes   Other Topics Concern  . None   Social History Narrative   family history includes Cancer in her mother and sister; Cancer (age of onset: 40) in her father; Clotting disorder in her brother; Deep vein thrombosis in her brother; Diabetes in her brother, mother, sister, and son; Hearing loss in her brother; Heart attack in her brother and father; Heart disease in her father; Hyperlipidemia in her brother, mother, and son; Hypertension in her brother and mother; Other in her mother.   Wt Readings from Last 3 Encounters:  11/07/15 123 lb 12.8 oz (56.155 kg)  10/26/15 124 lb (56.246 kg)  10/07/15 120 lb 13 oz (54.8 kg)    PHYSICAL EXAM BP 112/64 mmHg  Pulse 86  Ht '5\' 6"'$  (1.676 m)  Wt 123 lb 12.8 oz (56.155 kg)  BMI 19.99 kg/m2 General appearance: alert, cooperative, appears Older than stated age,appears to be in chronic milds and well-groomed.  HEENT: /AT, EOMI, MMM, anicteric sclera; poor dentition eck: no adenopathy, no carotid bruit and no JVD; soft right carotid bruit Lungs:Minimal air movement bilaterally with diffuse expiratory wheezing. Mild rhonchorous inspiration, no rales.,Hypertympanitic bilaterally, accessory muscle use, but non-labored Heart: distant S1 and S2, regular rate and rhythm, no murmur, click, rub or gallop; nondisplaced PMI  Abdomen: scaphoid, soft, non-tender; bowel sounds normal; no masses, no organomegaly;  Extremities: extremities normal, atraumatic, no cyanosis, and trivial edema  Pulses: 1+ left a DP pulse with faint PT pulse. Unable to palpate right PT or DP  Skin:  Dry scaly skin. Extensive brawny/reddish discoloration to bilateral lower extremities from knee down consistent with long-standing PAD and PVD. -- There is a nickel sized mildly erythematous slow healing ulcer on the right lower leg in the lateral aspect. This occurred with a simple abrasion. No signs of significant erythema beyond  the initial original site. Neurologic: Mental status: Alert, oriented, thought content appropriate Cranial nerves: normal (II-XII grossly intact)    Adult ECG Report n/a   Other studies Reviewed: Additional studies/ records that were reviewed today include:  Recent Labs:   Lab Results  Component Value Date   CHOL 109 10/06/2015   HDL 65 10/06/2015   LDLCALC 36 10/06/2015   TRIG 42 10/06/2015   CHOLHDL 1.7 10/06/2015      ASSESSMENT / PLAN: Problem List Items Addressed This Visit    Ulcer of right leg ([place])    It does appear to be somewhat healed, but she says is her normal first month now. This is probably related to poor peripheral perfusion as well as poor nutrition. We'll hold off on any antibiotic for now, would recommend she is seen by her PCP. We will also refer to the overnight sleep study clinic in the short stay reading.      Relevant Orders   Ambulatory referral to Vascular Surgery   Tobacco abuse (Chronic)    I did not spend too much time dressing this issue. We will be referring her to peripheral vascular surgery which will clearly discuss her continued smoking.      Protein-calorie malnutrition, moderate ([place])    Recommendations for dietary supplementation. ENCOURAGE TO EAT  AND DRINK AS MUCH MILKSHAKES AND SMOOTHIES ADD CARNATION INSTANT BREAKFAST  DRINK TO YOUR MIK SHAKES  DRINK ENOUGH WATER EVERYDAY - SO THAT YOUR URINE IS CLEAR      Peripheral vascular disease, unspecified ([place]) (Chronic)   Relevant Orders   Ambulatory referral to Vascular Surgery   Hypotension    Not on any target medications. We talked about adequate hydration and adequate nutrition      Hyperlipidemia with target LDL less than 70 (Chronic)    Labs were just checked precath. Pretty much at goal with LDL and HDL being easily at goal.      Claudication in peripheral vascular disease ([name]) (Chronic)    This is been followed by Dr. early. However, she has not been seen in some  time. She has worsening right lower location with slow/nonhealing ulcer on the slight minor injury.  She is due to see Dr. early back in August, I think we should probably try to get her in sooner. We have placed a request to step forward the planned event causing overlap.      CAD: Stents in p-mRCA, p & m Cx, p-mLAD (Chronic)    Recent cardiac catheterization with 2 vessel PCI/PTCA. Hemodynamically stable blood pressure stable no further chest pain. Only able tolerate statin with dual independent therapy. No blood pressure room for titrating medical management.      CAD S/P percutaneous coronary angioplasty    Multiple recent PCI sites with significant in-stent restenosis in the previously placed BMS stents.  1 stent and PTCA site:. On aspirin, Plavix and statin.      Angina, class III ([place])    Essentially resolved following recent PCI/PTCA. No further angina symptoms. With her low blood pressure at baseline, she is unable to tolerate beta blocker or even Imdur. For now she is only on aspirin, Plavix and statin.  Other Visit Diagnoses    Claudication [facility])    -  Primary    Relevant Orders    Ambulatory referral to Vascular Surgery       Current medicines are reviewed at length with the patient today. (+/- concerns) n/a The following changes have been made:   ENCOURAGE TO EAT  AND DRINK AS MUCH MILKSHAKES AND SMOOTHIES ADD CARNATION INSTANT BREAKFAST  DRINK TO YOUR MIK SHAKES  DRINK ENOUGH WATER EVERYDAY - SO THAT YOUR URINE IS CLEAR  You have been referred to  DR EARLY - sooner than AUGUST  - DUE TO ULCER ON RIGHT LEG, CLAUDICATIONS  Your physician wants you to follow-up in [place] DR Khaden Gater.   Studies Ordered:   Orders Placed This Encounter  Procedures  . Ambulatory referral to Vascular Surgery      Glenetta Hew, M.D., M.S. Interventional Cardiologist   Pager # [phone] Phone # [phone] [address] [place], [place]  [address]

## 2015-11-08 ENCOUNTER — Encounter: Payer: Self-pay | Admitting: Cardiology

## 2015-11-08 ENCOUNTER — Telehealth: Payer: Self-pay | Admitting: Family

## 2015-11-08 ENCOUNTER — Telehealth: Payer: Self-pay | Admitting: Cardiology

## 2015-11-08 NOTE — Telephone Encounter (Signed)
-----   Message from Corinna Lines sent at 11/07/2015  1:35 PM EDT ----- Regarding: rescheduling   Brenda [name]  This patient needs the 3 tests scheduled on 01-30-16 to be rescheduled as she has an ulcer on her right leg.  Please let me know when they can be rescheduled?  Thanks [employer] [employer]

## 2015-11-08 NOTE — Telephone Encounter (Signed)
He has an appointment with [place] on 11/14/15 at 10:45 with our nurse practitioner Vinnie Level Nickel.

## 2015-11-08 NOTE — Telephone Encounter (Signed)
Called [facility] to reschedule tests for patient to get them performed sooner.

## 2015-11-09 ENCOUNTER — Encounter: Payer: Self-pay | Admitting: Family

## 2015-11-09 ENCOUNTER — Telehealth: Payer: Self-pay | Admitting: *Deleted

## 2015-11-09 DIAGNOSIS — L97919 Non-pressure chronic ulcer of unspecified part of right lower leg with unspecified severity: Secondary | ICD-10-CM | POA: Insufficient documentation

## 2015-11-09 NOTE — Assessment & Plan Note (Signed)
I did not spend too much time dressing this issue. We will be referring her to peripheral vascular surgery which will clearly discuss her continued smoking.

## 2015-11-09 NOTE — Telephone Encounter (Signed)
-----   Message from Corinna Lines sent at 11/08/2015 11:01 AM EDT ----- Regarding: tests   Ivin Booty  I could not get the tests that the patient is scheduled for in August to be changed.  I got a message from their office saying that she will be seeing the NP on 11-14-15 and she would decide if they are too be moved to an earlier date.  [facility] [employer]

## 2015-11-09 NOTE — Assessment & Plan Note (Signed)
Recommendations for dietary supplementation. ENCOURAGE TO EAT  AND DRINK AS MUCH MILKSHAKES AND SMOOTHIES ADD CARNATION INSTANT BREAKFAST  DRINK TO YOUR MIK SHAKES  DRINK ENOUGH WATER EVERYDAY - SO THAT YOUR URINE IS CLEAR

## 2015-11-09 NOTE — Assessment & Plan Note (Signed)
Not on any target medications. We talked about adequate hydration and adequate nutrition

## 2015-11-09 NOTE — Assessment & Plan Note (Addendum)
Multiple recent PCI sites with significant in-stent restenosis in the previously placed BMS stents.  1 stent and PTCA site:. On aspirin, Plavix and statin.

## 2015-11-09 NOTE — Assessment & Plan Note (Signed)
This is been followed by Dr. early. However, she has not been seen in some time. She has worsening right lower location with slow/nonhealing ulcer on the slight minor injury.  She is due to see Dr. early back in August, I think we should probably try to get her in sooner. We have placed a request to step forward the planned event causing overlap.

## 2015-11-09 NOTE — Assessment & Plan Note (Signed)
It does appear to be somewhat healed, but she says is her normal first month now. This is probably related to poor peripheral perfusion as well as poor nutrition. We'll hold off on any antibiotic for now, would recommend she is seen by her PCP. We will also refer to the overnight sleep study clinic in the short stay reading.

## 2015-11-09 NOTE — Telephone Encounter (Signed)
FOR OUR INFORMATION

## 2015-11-09 NOTE — Assessment & Plan Note (Signed)
Essentially resolved following recent PCI/PTCA. No further angina symptoms. With her low blood pressure at baseline, she is unable to tolerate beta blocker or even Imdur. For now she is only on aspirin, Plavix and statin.

## 2015-11-09 NOTE — Assessment & Plan Note (Signed)
Labs were just checked precath. Pretty much at goal with LDL and HDL being easily at goal.

## 2015-11-09 NOTE — Assessment & Plan Note (Signed)
Recent cardiac catheterization with 2 vessel PCI/PTCA. Hemodynamically stable blood pressure stable no further chest pain. Only able tolerate statin with dual independent therapy. No blood pressure room for titrating medical management.

## 2015-11-13 ENCOUNTER — Encounter: Payer: Self-pay | Admitting: Pulmonary Disease

## 2015-11-13 ENCOUNTER — Ambulatory Visit (INDEPENDENT_AMBULATORY_CARE_PROVIDER_SITE_OTHER): Payer: Medicare Other | Admitting: Pulmonary Disease

## 2015-11-13 VITALS — BP 114/58 | HR 64 | Ht 66.0 in | Wt 123.8 lb

## 2015-11-13 DIAGNOSIS — I739 Peripheral vascular disease, unspecified: Secondary | ICD-10-CM | POA: Diagnosis not present

## 2015-11-13 DIAGNOSIS — J449 Chronic obstructive pulmonary disease, unspecified: Secondary | ICD-10-CM | POA: Diagnosis not present

## 2015-11-13 DIAGNOSIS — R1310 Dysphagia, unspecified: Secondary | ICD-10-CM | POA: Diagnosis not present

## 2015-11-13 DIAGNOSIS — Z720 Tobacco use: Secondary | ICD-10-CM

## 2015-11-13 NOTE — Assessment & Plan Note (Signed)
Despite her severe disease this has been a stable interval for her. Unfortunately, she continues to smoke cigarettes. I explained that this will cause her to have continued exacerbations.  Her current regimen of Breo and Spiriva is appropriate. She is using his short acting albuterol or Combivent only as needed which is fine.  Plan: Continue current medication regimen Follow-up 3 months Quit smoking

## 2015-11-13 NOTE — Assessment & Plan Note (Signed)
As above.

## 2015-11-13 NOTE — Progress Notes (Signed)
Subjective:    Patient ID: Brenda [name], female    DOB: 12/05/1950, 65 y.o.   MRN: [id number]  Synopsis: Severe COPD, still actively smoking as of June 2017, Also with dysphagia and recurrent aspiration leading to recurrent episodes of pneumonia. She also has coronary artery disease and underwent two-vessel coronary intervention in 2017. Simple spirometry FEV1 June 2016 0.67 L (27% predicted) with clear airflow obstruction  HPI Chief Complaint  Patient presents with  . Follow-up    pt recently had heart stent placed, SOB has greatly improved since then.  does note runny nose, PND, sore throat X2-3 days.    Vivienne is feeling much better after she had a two vessel coronary PCI recently by Dr. Ellyn Hack.  She has been feeling better now after one of her "old stents" was re-opened.  Apparently she had a severe blockage in both vessel.  She has breathing better since then.   She has been coughing, this seems to be worse with mucinex.  However she still has a lot of chest congestion if she doesn't take it.    She had an exacrebation of her COPD back in April, better now.  She is still smoking about 8 cigarettes a day.  Her husband recently quit smoking.  She still taking Breo and Spiriva.  She is using her albuterol only as needed.   Past Medical History  Diagnosis Date  . CAD S/P percutaneous coronary angioplasty 1999; 2001;2003, 10/2014    a) BMS- mCx; b) re-do PCI for prog of Dz (NIR BMS 3.5 x 12); c) staged PCI: p-mRCA Express II BMS 3.0x24 & 3.5 x 16 --> p-mLAD Cyper DES 3.0 x 13 & PTCA of D2 (2.5 balloon); 10/2015 PCI p-m LAD Promus DES 2.75 x 38 (covers pre&post-stent ISR), POBA of ostial rPDA  . Myocardial infarction [facility]) 1999, 2003  . PAD (peripheral artery disease) ([name])     status post bilateral femoropopliteal bypass grafting --> Dr. Sherren Mocha Early; -  02/2013 Revision of R Fem-AKPop to BK Pop.  . Hypercholesteremia   . Type II diabetes mellitus ([place])   . Transient ischemic  attack (TIA) February 2013  . Stroke [facility])     left side weakness remains (11/26/2012)  . Carotid artery occlusion   . History of DVT (deep vein thrombosis)   . COPD (chronic obstructive pulmonary disease) with emphysema ([place])   . Chronic bronchitis ([place])   . Pneumonia   . Depression   . Hx of radiation therapy   . History of blood transfusion   . GERD (gastroesophageal reflux disease)   . H/O hiatal hernia   . Arthritis     "hands" (11/26/2012)  . Cancer of base of tongue ([place]) 2010    "& lymph nodes @ right neck" (11/26/2012  . Hx of radiation therapy 01/16/09 - 03/06/09    base of tongue, right neck node  . Patent foramen ovale February 2013    Small. Small PFO noted on TTE for TIA/CVA R-L + Bubble study with valsalva      Review of Systems  Constitutional: Positive for fatigue. Negative for fever and chills.  HENT: Negative for postnasal drip, rhinorrhea and sinus pressure.   Respiratory: Positive for cough. Negative for wheezing.   Cardiovascular: Negative for chest pain, palpitations and leg swelling.       Objective:   Physical Exam Filed Vitals:   11/13/15 1054  BP: 114/58  Pulse: 64  Height: '5\' 6"'$  (1.676 m)  Weight: 56.155  kg (123 lb 12.8 oz)  SpO2: 95%   RA  Gen: chronically ill appearing, kyphosis HENT: OP clear, TM's clear, neck supple PULM: No wheezing today, poor movement CV: RRR, no mgr, trace edema GI: BS+, soft, nontender Derm: 2cm shallow ulceration out side R lower leg, no drainage or surrounding cellulitis Psyche: normal mood and affect  CBC    Component Value Date/Time   WBC 4.9 10/07/2015 0422   WBC 5.1 08/21/2012 1256   RBC 3.74* 10/07/2015 0422   RBC 4.85 08/21/2012 1256   HGB 10.8* 10/07/2015 0422   HGB 15.6 08/21/2012 1256   HCT 33.6* 10/07/2015 0422   HCT 44.6 08/21/2012 1256   PLT 143* 10/07/2015 0422   PLT 192 08/21/2012 1256   MCV 89.8 10/07/2015 0422   MCV 92.0 08/21/2012 1256   MCH 28.9 10/07/2015 0422   MCH 32.2  08/21/2012 1256   MCHC 32.1 10/07/2015 0422   MCHC 34.9 08/21/2012 1256   RDW 14.8 10/07/2015 0422   RDW 13.8 08/21/2012 1256   LYMPHSABS 0.4* 10/05/2015 1420   LYMPHSABS 0.6* 08/21/2012 1256   MONOABS 0.4 10/05/2015 1420   MONOABS 0.3 08/21/2012 1256   EOSABS 0.0 10/05/2015 1420   EOSABS 0.0 08/21/2012 1256   BASOSABS 0.1 10/05/2015 1420   BASOSABS 0.1 08/21/2012 1256   Cardiology records reviewed where she had recently undergone two-vessel coronary vascular intervention, also referred to vascular surgery for peripheral vascular disease.    Assessment & Plan:  Tobacco abuse Nearly all of her problems are due to tobacco use. Specifically, COPD, coronary disease, and peripheral vascular disease. I advised her today length that she needs to quit smoking. I'm concerned that if she continues to smoke her peripheral last her disease will worsen and she may end up with an AP Tatian considering the fact that she has been struggling with sores. I'm glad she is following up with Dr. early tomorrow. Greater than 3 minutes spent today in counseling about ways to quit smoking.  COPD (chronic obstructive pulmonary disease) ([place]) Despite her severe disease this has been a stable interval for her. Unfortunately, she continues to smoke cigarettes. I explained that this will cause her to have continued exacerbations.  Her current regimen of Breo and Spiriva is appropriate. She is using his short acting albuterol or Combivent only as needed which is fine.  Plan: Continue current medication regimen Follow-up 3 months Quit smoking  Peripheral vascular disease, unspecified ([place]) As above  Dysphagia with aspiration This has been a stable interval without recurrent pneumonia. I encouraged her to continue with her current swallowing technique.     Current outpatient prescriptions:  .  acetaminophen (TYLENOL) 500 MG tablet, Take 1,000 mg by mouth daily as needed for moderate pain. , Disp: , Rfl:  .   albuterol (PROVENTIL HFA;VENTOLIN HFA) 108 (90 BASE) MCG/ACT inhaler, Inhale 2 puffs into the lungs every 6 (six) hours as needed for wheezing or shortness of breath., Disp: 1 Inhaler, Rfl: 0 .  aspirin EC 81 MG tablet, Take 1 tablet (81 mg total) by mouth daily., Disp: 30 tablet, Rfl: 10 .  buPROPion (WELLBUTRIN XL) 300 MG 24 hr tablet, Take 300 mg by mouth daily. , Disp: , Rfl:  .  clopidogrel (PLAVIX) 75 MG tablet, Take 1 tablet (75 mg total) by mouth daily., Disp: 30 tablet, Rfl: 12 .  docusate sodium (COLACE) 100 MG capsule, Take 100 mg by mouth daily., Disp: , Rfl:  .  fish oil-omega-3 fatty acids 1000 MG  capsule, Take 1 g by mouth 2 (two) times daily. , Disp: , Rfl:  .  fluticasone (FLONASE) 50 MCG/ACT nasal spray, Place 2 sprays into both nostrils daily., Disp: 16 g, Rfl: 2 .  fluticasone furoate-vilanterol (BREO ELLIPTA) 100-25 MCG/INH AEPB, Inhale 1 puff into the lungs daily., Disp: 60 each, Rfl: 5 .  guaiFENesin (MUCINEX) 600 MG 12 hr tablet, Take 2 tablets (1,200 mg total) by mouth 2 (two) times daily., Disp: 60 tablet, Rfl: 3 .  insulin lispro (HUMALOG) 100 UNIT/ML injection, Inject 3 Units into the skin 3 (three) times daily with meals as needed for high blood sugar. Inject  3 units subcutaneous as needed when blood sugar is high per patient, Disp: , Rfl:  .  ipratropium-albuterol (DUONEB) 0.5-2.5 (3) MG/3ML SOLN, Take 3 mLs by nebulization every 6 (six) hours. (Patient taking differently: Take 3 mLs by nebulization 2 (two) times daily as needed (for wheezingt or shortness of breath). ), Disp: 360 mL, Rfl: 3 .  ipratropium-albuterol (DUONEB) 0.5-2.5 (3) MG/3ML SOLN, Take 3 mLs by nebulization every 4 (four) hours as needed., Disp: 360 mL, Rfl: 0 .  loratadine (CLARITIN) 10 MG tablet, Take 1 tablet (10 mg total) by mouth daily., Disp: 30 tablet, Rfl: 0 .  Multiple Vitamins-Minerals (MULTIVITAMIN & MINERAL PO), Take 1 tablet by mouth daily., Disp: , Rfl:  .  nitroGLYCERIN (NITROLINGUAL) 0.4  MG/SPRAY spray, Place 1 spray under the tongue every 5 (five) minutes as needed for chest pain., Disp: 12 g, Rfl: 0 .  omeprazole (PRILOSEC) 20 MG capsule, Take 20 mg by mouth daily. , Disp: , Rfl:  .  rosuvastatin (CRESTOR) 20 MG tablet, Take 20 mg by mouth daily., Disp: , Rfl: q

## 2015-11-13 NOTE — Assessment & Plan Note (Signed)
Nearly all of her problems are due to tobacco use. Specifically, COPD, coronary disease, and peripheral vascular disease. I advised her today length that she needs to quit smoking. I'm concerned that if she continues to smoke her peripheral last her disease will worsen and she may end up with an AP Tatian considering the fact that she has been struggling with sores. I'm glad she is following up with Dr. early tomorrow. Greater than 3 minutes spent today in counseling about ways to quit smoking.

## 2015-11-13 NOTE — Patient Instructions (Signed)
Quit smoking Keep using your inhaled medicines as you're doing Exercise regularly Follow-up with Dr. Donnetta Hutching tomorrow We will see you back in 3 months or sooner if needed

## 2015-11-13 NOTE — Assessment & Plan Note (Signed)
This has been a stable interval without recurrent pneumonia. I encouraged her to continue with her current swallowing technique.

## 2015-11-14 ENCOUNTER — Encounter: Payer: Self-pay | Admitting: Family

## 2015-11-14 ENCOUNTER — Ambulatory Visit (INDEPENDENT_AMBULATORY_CARE_PROVIDER_SITE_OTHER): Payer: Medicare Other | Admitting: Family

## 2015-11-14 VITALS — BP 116/64 | HR 90 | Temp 97.9°F | Resp 16 | Ht 66.0 in | Wt 121.0 lb

## 2015-11-14 DIAGNOSIS — Z95828 Presence of other vascular implants and grafts: Secondary | ICD-10-CM

## 2015-11-14 DIAGNOSIS — I70219 Atherosclerosis of native arteries of extremities with intermittent claudication, unspecified extremity: Secondary | ICD-10-CM

## 2015-11-14 DIAGNOSIS — E1151 Type 2 diabetes mellitus with diabetic peripheral angiopathy without gangrene: Secondary | ICD-10-CM

## 2015-11-14 DIAGNOSIS — I739 Peripheral vascular disease, unspecified: Secondary | ICD-10-CM | POA: Diagnosis not present

## 2015-11-14 DIAGNOSIS — Z720 Tobacco use: Secondary | ICD-10-CM | POA: Diagnosis not present

## 2015-11-14 DIAGNOSIS — M79671 Pain in right foot: Secondary | ICD-10-CM | POA: Diagnosis not present

## 2015-11-14 DIAGNOSIS — I998 Other disorder of circulatory system: Secondary | ICD-10-CM

## 2015-11-14 DIAGNOSIS — I999 Unspecified disorder of circulatory system: Secondary | ICD-10-CM

## 2015-11-14 DIAGNOSIS — F17200 Nicotine dependence, unspecified, uncomplicated: Secondary | ICD-10-CM

## 2015-11-14 DIAGNOSIS — R0989 Other specified symptoms and signs involving the circulatory and respiratory systems: Secondary | ICD-10-CM

## 2015-11-14 NOTE — Progress Notes (Signed)
[facility] [place]   CC: Follow up peripheral artery occlusive disease  History of Present Illness Brenda [name] is a 65 y.o. female patient of Dr.Early who is s/p right femoral to popliteal bypas graft 06/14/97 and then a revision with extension from prior above knee popliteal anastomosis to below knee popliteal artery with reversed small saphenous vein on 02/05/13. She also has a hx of left femoral to popliteal bypass graft with translocated non-reversed saphenous vein on 06/10/1998.   She returns today with c/o sore that came up spontaneously 2 months ago on the lateral aspect of her right lower leg. Pain in right toes started 2 weeks ago, wakes her up at night, is rotating takingTylenol and ibuprofen. Pt states she has been treating the right leg sore with betadine and Neosporin.  She reports chills for the last couple of weeks.  She had a heart cath in May 2017 and 1 stent placed after evaluation for chest discomfort.  She was having right calf pain after walking about 8 minutes at her February 2017 visit , seemed worse to her. She has been walking less since her many URI's. She has numbness in both feet at times, worse in the right foot. She denies claudication pain in left leg. She had a stroke in 2000 as manifested by left hemiparesis, expressive aphasia has resolved, has had several TIA's since then with slurred speech and trouble writing with her right hand, the symptoms resolve quickly, last episode was about 2013.   07/06/12 CT soft tissue neck: Atherosclerotic calcifications are present at the carotid bifurcations bilaterally without a significant stenosis.  She had an MI in 2005, states mild. She states she has an appointment with her cardiologist in April 2017.   The patient states she has had pneumonia 5x since March of 2016, and a couple of cases of self treated bronchitis. Pt states it was found that she had aspiration pneumonia and has taken measures to  prevent this. She lost weight with these URI's. 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  Pt Diabetic: Yes,  A1C in May 2017 was 8.9 (review of records), increased from previous 7.6, is yet uncontrolled. She started seeing Dr. Buddy Duty, endocrinologist, in February 2017. Pt smoker: smoker (13-14 cigarettes/day, started at age 61 yrs); she has attended smoking cessation classes 3 times, is taking Wellbutrin now which has not decreased her craving, she gets very depressed when not smoking, states she was on a higher dose previously.   Pt meds include: Statin :Yes Other anticoagulants/antiplatelets: Plavix    Past Medical History  Diagnosis Date  . CAD S/P percutaneous coronary angioplasty 1999; 2001;2003, 10/2014    a) BMS- mCx; b) re-do PCI for prog of Dz (NIR BMS 3.5 x 12); c) staged PCI: p-mRCA Express II BMS 3.0x24 & 3.5 x 16 --> p-mLAD Cyper DES 3.0 x 13 & PTCA of D2 (2.5 balloon); 10/2015 PCI p-m LAD Promus DES 2.75 x 38 (covers pre&post-stent ISR), POBA of ostial rPDA  . Myocardial infarction [facility]) 1999, 2003  . PAD (peripheral artery disease) ([name])     status post bilateral femoropopliteal bypass grafting --> Dr. Sherren Mocha Early; -  02/2013 Revision of R Fem-AKPop to BK Pop.  . Hypercholesteremia   . Type II diabetes mellitus ([place])   . Transient ischemic attack (TIA) February 2013  . Stroke [facility])     left side weakness  remains (11/26/2012)  . Carotid artery occlusion   . History of DVT (deep vein thrombosis)   . COPD (chronic obstructive pulmonary disease) with emphysema ([place])   . Chronic bronchitis ([place])   . Pneumonia   . Depression   . Hx of radiation therapy   . History of blood transfusion   . GERD (gastroesophageal reflux disease)   . H/O hiatal hernia   . Arthritis     "hands" (11/26/2012)  . 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 Hx of radiation  therapy 01/16/09 - 03/06/09    base of tongue, right neck node  . Patent foramen ovale February 2013    Small. Small PFO noted on TTE for TIA/CVA R-L + Bubble study with valsalva    Social History Social History  Substance Use Topics  . Smoking status: Light Tobacco Smoker -- 1.00 packs/day for 50 years    Types: Cigarettes  . Smokeless tobacco: Never Used     Comment: down to 8 cigarettes daily 11/13/15  . Alcohol Use: 3.0 oz/week    5 Glasses of wine per week    Family History Family History  Problem Relation Age of Onset  . Cancer Mother     pancreatic  . Diabetes Mother   . Hyperlipidemia Mother   . Hypertension Mother   . Other Mother     varicose vein  . Cancer Father 72    throat  . Heart disease Father   . Heart attack Father   . Cancer Sister     breast  . Diabetes Sister   . Deep vein thrombosis Brother   . Diabetes Brother   . Hearing loss Brother   . Hyperlipidemia Brother   . Hypertension Brother   . Heart attack Brother   . Clotting disorder Brother   . Diabetes Son   . Hyperlipidemia Son     Past Surgical History  Procedure Laterality Date  . Tee without cardioversion  07/26/2011    Procedure: TRANSESOPHAGEAL ECHOCARDIOGRAM (TEE);  Surgeon: Pixie Casino, MD;  Location: [facility] ENDOSCOPY;  Service: Cardiovascular;  Laterality: N/A;  . Esophagogastroduodenoscopy  12/20/2011    Procedure: ESOPHAGOGASTRODUODENOSCOPY (EGD);  Surgeon: Beryle Beams, MD;  Location: [facility] ENDOSCOPY;  Service: Endoscopy;  Laterality: N/A;  EGD with balloon dilation  . Savory dilation  12/20/2011    Procedure: SAVORY DILATION;  Surgeon: Beryle Beams, MD;  Location: [facility] ENDOSCOPY;  Service: Endoscopy;  Laterality: N/A;  . Tonsillectomy  1969  . Appendectomy  1962  . Cardiac catheterization  1999; 10/2005    5/'07: mildLAD 20-30%, patent stent, diags- no Dz. RCA 2 BMS patent ~20% ISR.   Marland Kitchen Coronary angioplasty with stent placement  1999--2005    1999 - OM2 BMS; 2001: p-mCx NIR BMS  3.5 x 12; 10/2001: p-mRCA Express II BMS - 3.5 x 16 - 3.0 x 24.   . Femoral bypass  1999; 2000    Bilateral Fem-AK POP bypass.   . Carpal tunnel release Right 1980's?  . Vaginal hysterectomy  1988  . Cesarean section  1978  . Tubal ligation  1988  . Cataract extraction w/ intraocular lens implant Right 2011  . Ankle fracture surgery Right 2007    "got a metal plate and 8 screws in it" (11/26/2012)  . Bypass graft popliteal to popliteal Right 02/05/2013    Procedure: BYPASS GRAFT ABOVE KNEE POPLITEAL TO BELOW KNEE POPLITEAL WITH SMALL  SAPHANOUS VEIN;  Surgeon: Rosetta Posner, MD;  Location: [place];  Service: Vascular;  Laterality: Right;  . Eye surgery    . Cardiac catheterization N/A 10/06/2015    Procedure: Left Heart Cath and Coronary Angiography;  Surgeon: Leonie Man, MD;  Location: [place] CV LAB;  Service: Cardiovascular; p-mLAD 99% pre-stent, 60% ISR & 80% post-stent, o-RPDA 90%, ~10% ISR in 2 RCA (Prox & mid) BMS,~20% ISR in pCx BMS with 5% OM2 BMS.    . Cardiac catheterization N/A 10/06/2015    Procedure: Coronary Stent Intervention;  Surgeon: Leonie Man, MD;  Location: [place] CV LAB;  Service: Cardiovascular; p-m LAD Promus DES 2.75 x 38 (tapered post-dilatoin)  . Cardiac catheterization N/A 10/06/2015    Procedure: Coronary Balloon Angioplasty;  Surgeon: Leonie Man, MD;  Location: [place] CV [facility];  Service: Cardiovascular;  POBA of Ostial rPDA.  . Transthoracic echocardiogram  March 2016    Normal LV size and function EF 55-60%. No regional wall motion and amount is. No significant valvular lesions.    Allergies  Allergen Reactions  . Tape Hives and Other (See Comments)    USE PAPER TAPE ONLY- Adhesive peels off skin-makes pt. Raw.  . Isosorbide Other (See Comments)    Drops BP too low  . Nicoderm [Nicotine] Rash    To the PATCH only, breakouts on skin    Current Outpatient Prescriptions  Medication Sig Dispense Refill  . acetaminophen (TYLENOL) 500 MG  tablet Take 1,000 mg by mouth daily as needed for moderate pain.     Marland Kitchen albuterol (PROVENTIL HFA;VENTOLIN HFA) 108 (90 BASE) MCG/ACT inhaler Inhale 2 puffs into the lungs every 6 (six) hours as needed for wheezing or shortness of breath. 1 Inhaler 0  . aspirin EC 81 MG tablet Take 1 tablet (81 mg total) by mouth daily. 30 tablet 10  . buPROPion (WELLBUTRIN XL) 300 MG 24 hr tablet Take 300 mg by mouth daily.     . clopidogrel (PLAVIX) 75 MG tablet Take 1 tablet (75 mg total) by mouth daily. 30 tablet 12  . docusate sodium (COLACE) 100 MG capsule Take 100 mg by mouth daily.    . fish oil-omega-3 fatty acids 1000 MG capsule Take 1 g by mouth 2 (two) times daily.     . fluticasone (FLONASE) 50 MCG/ACT nasal spray Place 2 sprays into both nostrils daily. 16 g 2  . fluticasone furoate-vilanterol (BREO ELLIPTA) 100-25 MCG/INH AEPB Inhale 1 puff into the lungs daily. 60 each 5  . guaiFENesin (MUCINEX) 600 MG 12 hr tablet Take 2 tablets (1,200 mg total) by mouth 2 (two) times daily. 60 tablet 3  . insulin lispro (HUMALOG) 100 UNIT/ML injection Inject 3 Units into the skin 3 (three) times daily with meals as needed for high blood sugar. Inject  3 units subcutaneous as needed when blood sugar is high per patient    . ipratropium-albuterol (DUONEB) 0.5-2.5 (3) MG/3ML SOLN Take 3 mLs by nebulization every 6 (six) hours. (Patient taking differently: Take 3 mLs by nebulization 2 (two) times daily as needed (for wheezingt or shortness of breath). ) 360 mL 3  . ipratropium-albuterol (DUONEB) 0.5-2.5 (3) MG/3ML SOLN Take 3 mLs by nebulization every 4 (four) hours as needed. 360 mL 0  . loratadine (CLARITIN) 10 MG tablet Take 1 tablet (10 mg total) by mouth daily. 30 tablet 0  . Multiple Vitamins-Minerals (MULTIVITAMIN & MINERAL PO) Take 1 tablet by mouth daily.    Marland Kitchen  nitroGLYCERIN (NITROLINGUAL) 0.4 MG/SPRAY spray Place 1 spray under the tongue every 5 (five) minutes as needed for chest pain. 12 g 0  . omeprazole  (PRILOSEC) 20 MG capsule Take 20 mg by mouth daily.     . rosuvastatin (CRESTOR) 20 MG tablet Take 20 mg by mouth daily.     No current facility-administered medications for this visit.    ROS: See HPI for pertinent positives and negatives.   Physical Examination  Filed Vitals:   11/14/15 1037  BP: 116/64  Pulse: 90  Temp: 97.9 F (36.6 C)  Resp: 16  Height: '5\' 6"'$  (1.676 m)  Weight: 121 lb (54.885 kg)  SpO2: 96%   Body mass index is 19.54 kg/(m^2).  General: A&O x 3, WDWN, thin female. Gait: antalgic, favors right foot Eyes: PERRLA. Pulmonary: limited air movement in all fields, respirations are mildly labored at rest with moist cough  Cardiac: regular rhythm, no detected murmur.     Carotid Bruits Left Right   Negative positive  Aorta is not palpable. Radial pulses: 2+ palpable right, 1+ palpable left   VASCULAR EXAM: Extremities with ischemic changes: all right toes are ruddy, without Gangrene; with open wounds: 1.3 cm x 1 cm shallow ulcer, no drainage, right lower leg, lateral aspect.     LE Pulses LEFT RIGHT   FEMORAL 2+ palpable 1+ palpable    POPLITEAL 2+ palpable  not palpable   POSTERIOR TIBIAL not palpable, Moderate Doppler signal  not palpable, brisk Doppler signal    DORSALIS PEDIS  ANTERIOR TIBIAL not palpable, moderate Doppler signal  not palpable, muffled Doppler signal    Abdomen: soft, NT, no palpable masses. Skin: no rashes, see Extremities. Musculoskeletal: no muscle wasting or atrophy. Moderate kyphosis. Neurologic: A&O X 3; Appropriate Affect ; SENSATION: normal; MOTOR FUNCTION: moving all extremities equally, motor strength 5/5 throughout. Speech is fluent/normal. CN 2-12  intact.                 Non-Invasive Vascular Imaging: DATE: 11/03/15 at [facility]:  ABI:  RIGHT: 0.52 (0.48, 07/25/15), Waveforms: monophasic; TBI: 0.16 LEFT: 1.1 (1.1), Waveforms: triphasic. TBI: 0.49   ASSESSMENT: Brenda [name] is a 65 y.o. female who is s/p right femoral to popliteal bypas graft 06/14/97 and then a revision with extension from prior above knee popliteal anastomosis to below knee popliteal artery with reversed small saphenous vein on 02/05/13. She also has a hx of left femoral to popliteal bypass graft with translocated non-reversed saphenous vein on 06/10/1998.   She had a stroke in 2000 as manifested by left hemiparesis, expressive aphasia has resolved, has had several TIA's since then with slurred speech and trouble writing with her right hand, the symptoms resolve quickly, last episode was about 2013.   06/18/14 carotid Duplex suggested bilateral internal carotid artery stenosis less than 40%, essentially unchanged when compared to previous outside study performed at [facility] 07/25/2011.  She returns today with c/o sore that came up spontaneously 2 months ago on the lateral aspect of her right lower leg. Pain in right toes started 2 weeks ago, wakes her up at night, is rotating takingTylenol and ibuprofen. Pt states she has been treating the right leg sore with betadine and Neosporin.  She reports chills for the last couple of weeks.  Right ABI is stable compared to February 2017, with moderate arterial flow reduction, monophasic waveforms. Left ABI remains normal with triphasic waveforms.   10/07/15 creatinine was 0.7. Dr. Donnetta Hutching spoke with and examined pt.  Face to face time with patient was 25 minutes. Over 50% of this time was spent on counseling and coordination of care.    PLAN:  Based on the patient's vascular studies and examination, pt will be scheduled for angiogram with bilateral run off, possible intervention, Dr. Donnetta Hutching if possible, at pt convenience but  soon.  The patient was counseled re smoking cessation and given several free resources re smoking cessation.  I discussed in depth with the patient the nature of atherosclerosis, and emphasized the importance of maximal medical management including strict control of blood pressure, blood glucose, and lipid levels, obtaining regular exercise, and cessation of smoking.  The patient is aware that without maximal medical management the underlying atherosclerotic disease process will progress, limiting the benefit of any interventions.  The patient was given information about PAD including signs, symptoms, treatment, what symptoms should prompt the patient to seek immediate medical care, and risk reduction measures to take.  Clemon Chambers, RN, MSN, FNP-C [facility] [place] [facility] Phone: [phone]  Clinic MD: Early  11/14/2015 10:51 AM

## 2015-11-14 NOTE — Patient Instructions (Signed)
Peripheral Vascular Disease Peripheral vascular disease (PVD) is a disease of the blood vessels that are not part of your heart and brain. A simple term for PVD is poor circulation. In most cases, PVD narrows the blood vessels that carry blood from your heart to the rest of your body. This can result in a decreased supply of blood to your arms, legs, and internal organs, like your stomach or kidneys. However, it most often affects a person's lower legs and feet. There are two types of PVD.  Organic PVD. This is the more common type. It is caused by damage to the structure of blood vessels.  Functional PVD. This is caused by conditions that make blood vessels contract and tighten (spasm). Without treatment, PVD tends to get worse over time. PVD can also lead to acute ischemic limb. This is when an arm or limb suddenly has trouble getting enough blood. This is a medical emergency. CAUSES Each type of PVD has many different causes. The most common cause of PVD is buildup of a fatty material (plaque) inside of your arteries (atherosclerosis). Small amounts of plaque can break off from the walls of the blood vessels and become lodged in a smaller artery. This blocks blood flow and can cause acute ischemic limb. Other common causes of PVD include:  Blood clots that form inside of blood vessels.  Injuries to blood vessels.  Diseases that cause inflammation of blood vessels or cause blood vessel spasms.  Health behaviors and health history that increase your risk of developing PVD. RISK FACTORS  You may have a greater risk of PVD if you:  Have a family history of PVD.  Have certain medical conditions, including:  High cholesterol.  Diabetes.  High blood pressure (hypertension).  Coronary heart disease.  Past problems with blood clots.  Past injury, such as burns or a broken bone. These may have damaged blood vessels in your limbs.  Buerger disease. This is caused by inflamed blood  vessels in your hands and feet.  Some forms of arthritis.  Rare birth defects that affect the arteries in your legs.  Use tobacco.  Do not get enough exercise.  Are obese.  Are age 65 or older. SIGNS AND SYMPTOMS  PVD may cause many different symptoms. Your symptoms depend on what part of your body is not getting enough blood. Some common signs and symptoms include:  Cramps in your lower legs. This may be a symptom of poor leg circulation (claudication).  Pain and weakness in your legs while you are physically active that goes away when you rest (intermittent claudication).  Leg pain when at rest.  Leg numbness, tingling, or weakness.  Coldness in a leg or foot, especially when compared with the other leg.  Skin or hair changes. These can include:  Hair loss.  Shiny skin.  Pale or bluish skin.  Thick toenails.  Inability to get or maintain an erection (erectile dysfunction). People with PVD are more prone to developing ulcers and sores on their toes, feet, or legs. These may take longer than normal to heal. DIAGNOSIS Your health care provider may diagnose PVD from your signs and symptoms. The health care provider will also do a physical exam. You may have tests to find out what is causing your PVD and determine its severity. Tests may include:  Blood pressure recordings from your arms and legs and measurements of the strength of your pulses (pulse volume recordings).  Imaging studies using sound waves to take pictures of   the blood flow through your blood vessels (Doppler ultrasound).  Injecting a dye into your blood vessels before having imaging studies using:  X-rays (angiogram or arteriogram).  Computer-generated X-rays (CT angiogram).  A powerful electromagnetic field and a computer (magnetic resonance angiogram or MRA). TREATMENT Treatment for PVD depends on the cause of your condition and the severity of your symptoms. It also depends on your age. Underlying  causes need to be treated and controlled. These include long-lasting (chronic) conditions, such as diabetes, high cholesterol, and high blood pressure. You may need to first try making lifestyle changes and taking medicines. Surgery may be needed if these do not work. Lifestyle changes may include:  Quitting smoking.  Exercising regularly.  Following a low-fat, low-cholesterol diet. Medicines may include:  Blood thinners to prevent blood clots.  Medicines to improve blood flow.  Medicines to improve your blood cholesterol levels. Surgical procedures may include:  A procedure that uses an inflated balloon to open a blocked artery and improve blood flow (angioplasty).  A procedure to put in a tube (stent) to keep a blocked artery open (stent implant).  Surgery to reroute blood flow around a blocked artery (peripheral bypass surgery).  Surgery to remove dead tissue from an infected wound on the affected limb.  Amputation. This is surgical removal of the affected limb. This may be necessary in cases of acute ischemic limb that are not improved through medical or surgical treatments. HOME CARE INSTRUCTIONS  Take medicines only as directed by your health care provider.  Do not use any tobacco products, including cigarettes, chewing tobacco, or electronic cigarettes. If you need help quitting, ask your health care provider.  Lose weight if you are overweight, and maintain a healthy weight as directed by your health care provider.  Eat a diet that is low in fat and cholesterol. If you need help, ask your health care provider.  Exercise regularly. Ask your health care provider to suggest some good activities for you.  Use compression stockings or other mechanical devices as directed by your health care provider.  Take good care of your feet.  Wear comfortable shoes that fit well.  Check your feet often for any cuts or sores. SEEK MEDICAL CARE IF:  You have cramps in your legs  while walking.  You have leg pain when you are at rest.  You have coldness in a leg or foot.  Your skin changes.  You have erectile dysfunction.  You have cuts or sores on your feet that are not healing. SEEK IMMEDIATE MEDICAL CARE IF:  Your arm or leg turns cold and blue.  Your arms or legs become red, warm, swollen, painful, or numb.  You have chest pain or trouble breathing.  You suddenly have weakness in your face, arm, or leg.  You become very confused or lose the ability to speak.  You suddenly have a very bad headache or lose your vision.   This information is not intended to replace advice given to you by your health care provider. Make sure you discuss any questions you have with your health care provider.   Document Released: 06/27/2004 Document Revised: 06/10/2014 Document Reviewed: 10/28/2013 Elsevier Interactive Patient Education 2016 [employer]     Steps to Quit Smoking  Smoking tobacco can be harmful to your health and can affect almost every organ in your body. Smoking puts you, and those around you, at risk for developing many serious chronic diseases. Quitting smoking is difficult, but it is one of   the best things that you can do for your health. It is never too late to quit. WHAT ARE THE BENEFITS OF QUITTING SMOKING? When you quit smoking, you lower your risk of developing serious diseases and conditions, such as:  Lung cancer or lung disease, such as COPD.  Heart disease.  Stroke.  Heart attack.  Infertility.  Osteoporosis and bone fractures. Additionally, symptoms such as coughing, wheezing, and shortness of breath may get better when you quit. You may also find that you get sick less often because your body is stronger at fighting off colds and infections. If you are pregnant, quitting smoking can help to reduce your chances of having a baby of low birth weight. HOW DO I GET READY TO QUIT? When you decide to quit smoking, create a plan to  make sure that you are successful. Before you quit:  Pick a date to quit. Set a date within the next two weeks to give you time to prepare.  Write down the reasons why you are quitting. Keep this list in places where you will see it often, such as on your bathroom mirror or in your car or wallet.  Identify the people, places, things, and activities that make you want to smoke (triggers) and avoid them. Make sure to take these actions:  Throw away all cigarettes at home, at work, and in your car.  Throw away smoking accessories, such as ashtrays and lighters.  Clean your car and make sure to empty the ashtray.  Clean your home, including curtains and carpets.  Tell your family, friends, and coworkers that you are quitting. Support from your loved ones can make quitting easier.  Talk with your health care provider about your options for quitting smoking.  Find out what treatment options are covered by your health insurance. WHAT STRATEGIES CAN I USE TO QUIT SMOKING?  Talk with your healthcare provider about different strategies to quit smoking. Some strategies include:  Quitting smoking altogether instead of gradually lessening how much you smoke over a period of time. Research shows that quitting "cold turkey" is more successful than gradually quitting.  Attending in-person counseling to help you build problem-solving skills. You are more likely to have success in quitting if you attend several counseling sessions. Even short sessions of 10 minutes can be effective.  Finding resources and support systems that can help you to quit smoking and remain smoke-free after you quit. These resources are most helpful when you use them often. They can include:  Online chats with a counselor.  Telephone quitlines.  Printed self-help materials.  Support groups or group counseling.  Text messaging programs.  Mobile phone applications.  Taking medicines to help you quit smoking. (If you are  pregnant or breastfeeding, talk with your health care provider first.) Some medicines contain nicotine and some do not. Both types of medicines help with cravings, but the medicines that include nicotine help to relieve withdrawal symptoms. Your health care provider may recommend:  Nicotine patches, gum, or lozenges.  Nicotine inhalers or sprays.  Non-nicotine medicine that is taken by mouth. Talk with your health care provider about combining strategies, such as taking medicines while you are also receiving in-person counseling. Using these two strategies together makes you more likely to succeed in quitting than if you used either strategy on its own. If you are pregnant or breastfeeding, talk with your health care provider about finding counseling or other support strategies to quit smoking. Do not take medicine to help you   quit smoking unless told to do so by your health care provider. WHAT THINGS CAN I DO TO MAKE IT EASIER TO QUIT? Quitting smoking might feel overwhelming at first, but there is a lot that you can do to make it easier. Take these important actions:  Reach out to your family and friends and ask that they support and encourage you during this time. Call telephone quitlines, reach out to support groups, or work with a counselor for support.  Ask people who smoke to avoid smoking around you.  Avoid places that trigger you to smoke, such as bars, parties, or smoke-break areas at work.  Spend time around people who do not smoke.  Lessen stress in your life, because stress can be a smoking trigger for some people. To lessen stress, try:  Exercising regularly.  Deep-breathing exercises.  Yoga.  Meditating.  Performing a body scan. This involves closing your eyes, scanning your body from head to toe, and noticing which parts of your body are particularly tense. Purposefully relax the muscles in those areas.  Download or purchase mobile phone or tablet apps (applications)  that can help you stick to your quit plan by providing reminders, tips, and encouragement. There are many free apps, such as QuitGuide from the CDC (Centers for Disease Control and Prevention). You can find other support for quitting smoking (smoking cessation) through smokefree.gov and other websites. HOW WILL I FEEL WHEN I QUIT SMOKING? Within the first 24 hours of quitting smoking, you may start to feel some withdrawal symptoms. These symptoms are usually most noticeable 2-3 days after quitting, but they usually do not last beyond 2-3 weeks. Changes or symptoms that you might experience include:  Mood swings.  Restlessness, anxiety, or irritation.  Difficulty concentrating.  Dizziness.  Strong cravings for sugary foods in addition to nicotine.  Mild weight gain.  Constipation.  Nausea.  Coughing or a sore throat.  Changes in how your medicines work in your body.  A depressed mood.  Difficulty sleeping (insomnia). After the first 2-3 weeks of quitting, you may start to notice more positive results, such as:  Improved sense of smell and taste.  Decreased coughing and sore throat.  Slower heart rate.  Lower blood pressure.  Clearer skin.  The ability to breathe more easily.  Fewer sick days. Quitting smoking is very challenging for most people. Do not get discouraged if you are not successful the first time. Some people need to make many attempts to quit before they achieve long-term success. Do your best to stick to your quit plan, and talk with your health care provider if you have any questions or concerns.   This information is not intended to replace advice given to you by your health care provider. Make sure you discuss any questions you have with your health care provider.   Document Released: 05/14/2001 Document Revised: 10/04/2014 Document Reviewed: 10/04/2014 Elsevier Interactive Patient Education 2016 Elsevier Inc.  

## 2015-11-17 ENCOUNTER — Other Ambulatory Visit: Payer: Self-pay

## 2015-11-17 ENCOUNTER — Ambulatory Visit (INDEPENDENT_AMBULATORY_CARE_PROVIDER_SITE_OTHER)
Admission: RE | Admit: 2015-11-17 | Discharge: 2015-11-17 | Disposition: A | Discharge status: Home / Self Care | Payer: Medicare Other | Source: Ambulatory Visit | Attending: Acute Care | Admitting: Acute Care

## 2015-11-17 DIAGNOSIS — F17210 Nicotine dependence, cigarettes, uncomplicated: Secondary | ICD-10-CM | POA: Diagnosis not present

## 2015-11-21 ENCOUNTER — Other Ambulatory Visit: Payer: Self-pay | Admitting: Family Medicine

## 2015-11-21 DIAGNOSIS — Z1231 Encounter for screening mammogram for malignant neoplasm of breast: Secondary | ICD-10-CM

## 2015-11-28 ENCOUNTER — Other Ambulatory Visit: Payer: Self-pay

## 2015-11-28 ENCOUNTER — Telehealth: Payer: Self-pay | Admitting: Acute Care

## 2015-11-28 ENCOUNTER — Ambulatory Visit
Admission: RE | Admit: 2015-11-28 | Discharge: 2015-11-28 | Disposition: A | Discharge status: Home / Self Care | Payer: Medicare Other | Source: Ambulatory Visit | Attending: Family Medicine | Admitting: Family Medicine

## 2015-11-28 DIAGNOSIS — Z1231 Encounter for screening mammogram for malignant neoplasm of breast: Secondary | ICD-10-CM

## 2015-11-28 NOTE — Telephone Encounter (Signed)
I have called the results of the CT scan to the patient. I explained that the scan is abnormal and that I would like to review it with one of the pulmonologist's to determine if we need to do initiate PET scan now or re-CT in 3 months. I told her I will follow up with her as soon as I get feedback. She verbalized understanding and had no further questions for me upon ending the call.

## 2015-11-30 ENCOUNTER — Telehealth: Payer: Self-pay | Admitting: Acute Care

## 2015-11-30 ENCOUNTER — Other Ambulatory Visit: Payer: Self-pay | Admitting: Acute Care

## 2015-11-30 DIAGNOSIS — R911 Solitary pulmonary nodule: Secondary | ICD-10-CM

## 2015-11-30 NOTE — Telephone Encounter (Signed)
I called and spoke with Ms. Ebers. I told her that I had spoken with Dr. Lamonte Sakai and that we had reviewed her CT scan together. I explained that after reviewing the scan Dr. Lamonte Sakai feels we should re-scan her in 3 months and re-evaluate the nodule that is the concern at that time.She verbalized understanding and is in agreement with this plan.She had no further questions at the end of the call.She does have my contact information in the event she has any further questions.

## 2015-12-01 ENCOUNTER — Encounter (HOSPITAL_COMMUNITY)
Admission: RE | Disposition: A | Discharge status: Home / Self Care | Payer: Self-pay | Source: Ambulatory Visit | Attending: Vascular Surgery

## 2015-12-01 ENCOUNTER — Ambulatory Visit (HOSPITAL_COMMUNITY)
Admission: RE | Admit: 2015-12-01 | Discharge: 2015-12-01 | Disposition: A | Discharge status: Home / Self Care | Payer: Medicare Other | Source: Ambulatory Visit | Attending: Vascular Surgery | Admitting: Vascular Surgery

## 2015-12-01 DIAGNOSIS — R634 Abnormal weight loss: Secondary | ICD-10-CM | POA: Insufficient documentation

## 2015-12-01 DIAGNOSIS — Z794 Long term (current) use of insulin: Secondary | ICD-10-CM | POA: Diagnosis not present

## 2015-12-01 DIAGNOSIS — E1151 Type 2 diabetes mellitus with diabetic peripheral angiopathy without gangrene: Secondary | ICD-10-CM | POA: Diagnosis not present

## 2015-12-01 DIAGNOSIS — M79674 Pain in right toe(s): Secondary | ICD-10-CM | POA: Insufficient documentation

## 2015-12-01 DIAGNOSIS — Z801 Family history of malignant neoplasm of trachea, bronchus and lung: Secondary | ICD-10-CM | POA: Diagnosis not present

## 2015-12-01 DIAGNOSIS — M79661 Pain in right lower leg: Secondary | ICD-10-CM | POA: Insufficient documentation

## 2015-12-01 DIAGNOSIS — E7800 Pure hypercholesterolemia, unspecified: Secondary | ICD-10-CM | POA: Insufficient documentation

## 2015-12-01 DIAGNOSIS — L97819 Non-pressure chronic ulcer of other part of right lower leg with unspecified severity: Secondary | ICD-10-CM | POA: Insufficient documentation

## 2015-12-01 DIAGNOSIS — I2510 Atherosclerotic heart disease of native coronary artery without angina pectoris: Secondary | ICD-10-CM | POA: Insufficient documentation

## 2015-12-01 DIAGNOSIS — Z85810 Personal history of malignant neoplasm of tongue: Secondary | ICD-10-CM | POA: Insufficient documentation

## 2015-12-01 DIAGNOSIS — Z8249 Family history of ischemic heart disease and other diseases of the circulatory system: Secondary | ICD-10-CM | POA: Insufficient documentation

## 2015-12-01 DIAGNOSIS — I6529 Occlusion and stenosis of unspecified carotid artery: Secondary | ICD-10-CM | POA: Insufficient documentation

## 2015-12-01 DIAGNOSIS — Z7902 Long term (current) use of antithrombotics/antiplatelets: Secondary | ICD-10-CM | POA: Diagnosis not present

## 2015-12-01 DIAGNOSIS — M1990 Unspecified osteoarthritis, unspecified site: Secondary | ICD-10-CM | POA: Diagnosis not present

## 2015-12-01 DIAGNOSIS — F17210 Nicotine dependence, cigarettes, uncomplicated: Secondary | ICD-10-CM | POA: Insufficient documentation

## 2015-12-01 DIAGNOSIS — Z833 Family history of diabetes mellitus: Secondary | ICD-10-CM | POA: Insufficient documentation

## 2015-12-01 DIAGNOSIS — Z7982 Long term (current) use of aspirin: Secondary | ICD-10-CM | POA: Diagnosis not present

## 2015-12-01 DIAGNOSIS — Z803 Family history of malignant neoplasm of breast: Secondary | ICD-10-CM | POA: Insufficient documentation

## 2015-12-01 DIAGNOSIS — Z86718 Personal history of other venous thrombosis and embolism: Secondary | ICD-10-CM | POA: Diagnosis not present

## 2015-12-01 DIAGNOSIS — I252 Old myocardial infarction: Secondary | ICD-10-CM | POA: Diagnosis not present

## 2015-12-01 DIAGNOSIS — Z923 Personal history of irradiation: Secondary | ICD-10-CM | POA: Insufficient documentation

## 2015-12-01 DIAGNOSIS — I70338 Atherosclerosis of unspecified type of bypass graft(s) of the right leg with ulceration of other part of lower leg: Secondary | ICD-10-CM | POA: Insufficient documentation

## 2015-12-01 DIAGNOSIS — K219 Gastro-esophageal reflux disease without esophagitis: Secondary | ICD-10-CM | POA: Insufficient documentation

## 2015-12-01 DIAGNOSIS — Z808 Family history of malignant neoplasm of other organs or systems: Secondary | ICD-10-CM | POA: Diagnosis not present

## 2015-12-01 DIAGNOSIS — I70212 Atherosclerosis of native arteries of extremities with intermittent claudication, left leg: Secondary | ICD-10-CM | POA: Diagnosis not present

## 2015-12-01 DIAGNOSIS — I70238 Atherosclerosis of native arteries of right leg with ulceration of other part of lower right leg: Secondary | ICD-10-CM | POA: Diagnosis not present

## 2015-12-01 DIAGNOSIS — J449 Chronic obstructive pulmonary disease, unspecified: Secondary | ICD-10-CM | POA: Diagnosis not present

## 2015-12-01 DIAGNOSIS — Z955 Presence of coronary angioplasty implant and graft: Secondary | ICD-10-CM | POA: Diagnosis not present

## 2015-12-01 DIAGNOSIS — F329 Major depressive disorder, single episode, unspecified: Secondary | ICD-10-CM | POA: Insufficient documentation

## 2015-12-01 HISTORY — PX: PERIPHERAL VASCULAR CATHETERIZATION: SHX172C

## 2015-12-01 LAB — POCT I-STAT, CHEM 8
BUN: 15 mg/dL (ref 6–20)
CALCIUM ION: 1.21 mmol/L (ref 1.12–1.23)
Chloride: 98 mmol/L — ABNORMAL LOW (ref 101–111)
Creatinine, Ser: 1 mg/dL (ref 0.44–1.00)
GLUCOSE: 177 mg/dL — AB (ref 65–99)
HCT: 44 % (ref 36.0–46.0)
HEMOGLOBIN: 15 g/dL (ref 12.0–15.0)
Potassium: 4.6 mmol/L (ref 3.5–5.1)
SODIUM: 136 mmol/L (ref 135–145)
TCO2: 31 mmol/L (ref 0–100)

## 2015-12-01 SURGERY — ABDOMINAL AORTOGRAM W/LOWER EXTREMITY
Anesthesia: LOCAL

## 2015-12-01 MED ORDER — HEPARIN (PORCINE) IN NACL 2-0.9 UNIT/ML-% IJ SOLN
INTRAMUSCULAR | Status: AC
  Filled 2015-12-01: qty 500

## 2015-12-01 MED ORDER — SODIUM CHLORIDE 0.45 % IV SOLN
INTRAVENOUS | Status: DC
  Administered 2015-12-01: 11:00:00 via INTRAVENOUS

## 2015-12-01 MED ORDER — METOPROLOL TARTRATE 5 MG/5ML IV SOLN: 2.0000 mg | INTRAVENOUS | Status: DC | PRN

## 2015-12-01 MED ORDER — HEPARIN (PORCINE) IN NACL 2-0.9 UNIT/ML-% IJ SOLN
INTRAMUSCULAR | Status: DC | PRN
Start: 2015-12-01 — End: 2015-12-01
  Administered 2015-12-01: 1000 mL

## 2015-12-01 MED ORDER — ALBUTEROL SULFATE (2.5 MG/3ML) 0.083% IN NEBU
2.5000 mg | INHALATION_SOLUTION | RESPIRATORY_TRACT | Status: DC | PRN
  Administered 2015-12-01: 2.5 mg via RESPIRATORY_TRACT
  Filled 2015-12-01 (×2): qty 3

## 2015-12-01 MED ORDER — MORPHINE SULFATE (PF) 10 MG/ML IV SOLN: 2.0000 mg | INTRAVENOUS | Status: DC | PRN

## 2015-12-01 MED ORDER — LIDOCAINE HCL (PF) 1 % IJ SOLN
INTRAMUSCULAR | Status: DC | PRN
  Administered 2015-12-01: 30 mL

## 2015-12-01 MED ORDER — LIDOCAINE HCL (PF) 1 % IJ SOLN
INTRAMUSCULAR | Status: AC
  Filled 2015-12-01: qty 30

## 2015-12-01 MED ORDER — HYDRALAZINE HCL 20 MG/ML IJ SOLN: 5.0000 mg | INTRAMUSCULAR | Status: DC | PRN

## 2015-12-01 MED ORDER — ACETAMINOPHEN 325 MG PO TABS: 325.0000 mg | ORAL_TABLET | ORAL | Status: DC | PRN

## 2015-12-01 MED ORDER — IODIXANOL 320 MG/ML IV SOLN
INTRAVENOUS | Status: DC | PRN
  Administered 2015-12-01: 142 mL via INTRA_ARTERIAL

## 2015-12-01 MED ORDER — ALBUTEROL SULFATE (2.5 MG/3ML) 0.083% IN NEBU
INHALATION_SOLUTION | RESPIRATORY_TRACT | Status: AC
  Filled 2015-12-01: qty 3

## 2015-12-01 MED ORDER — LABETALOL HCL 5 MG/ML IV SOLN: 10.0000 mg | INTRAVENOUS | Status: DC | PRN

## 2015-12-01 MED ORDER — ACETAMINOPHEN 325 MG RE SUPP: 325.0000 mg | RECTAL | Status: DC | PRN

## 2015-12-01 MED ORDER — HEPARIN SODIUM (PORCINE) 1000 UNIT/ML IJ SOLN: INTRAMUSCULAR | Status: DC | PRN

## 2015-12-01 MED ORDER — SODIUM CHLORIDE 0.9 % IV SOLN
INTRAVENOUS | Status: DC
  Administered 2015-12-01: 08:00:00 via INTRAVENOUS

## 2015-12-01 MED ORDER — ONDANSETRON HCL 4 MG/2ML IJ SOLN: 4.0000 mg | Freq: Four times a day (QID) | INTRAMUSCULAR | Status: DC | PRN

## 2015-12-01 SURGICAL SUPPLY — 11 items
CATH ANGIO 5F BER2 65CM (CATHETERS) ×1 IMPLANT
CATH OMNI FLUSH 5F 65CM (CATHETERS) ×1 IMPLANT
COVER PRB 48X5XTLSCP FOLD TPE (BAG) IMPLANT
COVER PROBE 5X48 (BAG) ×4
GUIDEWIRE ANGLED .035X150CM (WIRE) ×1 IMPLANT
KIT PV (KITS) ×2 IMPLANT
SHEATH PINNACLE 5F 10CM (SHEATH) ×2 IMPLANT
SYR MEDRAD MARK V 150ML (SYRINGE) ×2 IMPLANT
TRANSDUCER W/STOPCOCK (MISCELLANEOUS) ×2 IMPLANT
TRAY PV CATH (CUSTOM PROCEDURE TRAY) ×2 IMPLANT
WIRE HITORQ VERSACORE ST 145CM (WIRE) ×1 IMPLANT

## 2015-12-01 NOTE — Progress Notes (Signed)
Dr. Oneida Alar office, ie Arbie Cookey,  notified that pt's family did not get the opportunity to speak with Dr. Oneida Alar. Family is ok with him calling them this afternoon or Monday.

## 2015-12-01 NOTE — Interval H&P Note (Signed)
History and Physical Interval Note:  12/01/2015 8:26 AM  Brenda [name]  has presented today for surgery, with the diagnosis of ischemic rest pain of right leg, nonhealing ulcer  The various methods of treatment have been discussed with the patient and family. After consideration of risks, benefits and other options for treatment, the patient has consented to  Procedure(s): Abdominal Aortogram w/Lower Extremity (N/A) as a surgical intervention .  The patient's history has been reviewed, patient examined, no change in status, stable for surgery.  I have reviewed the patient's chart and labs.  Questions were answered to the patient's satisfaction.     Ruta Hinds

## 2015-12-01 NOTE — Op Note (Signed)
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 The patient was placed in supine position the Angio table. Both groins were prepped and draped in usual sterile fashion. Local anesthesia was after the right common femoral artery. Ultrasound was used to identify the right common femoral artery. An introducer needle was then used to cannulate the right common femoral artery without difficulty. An 035 versacore wire was then threaded up into the right pelvis. The guidewire would not easily advance all the way to the aorta. A 5 French sheath was placed over the guidewire and the right external iliac artery. This was thoroughly flushed with heparinized saline. I then brought a 5 [place] Beren[employer] to [employer] for Education officer, community. I Still Could Not Advance the Guidewire into the Abdominal Aorta. I Also Exchanged This out for an 035 Angled Glidewire and Still Could Not Advance the Wire into the Aorta. Contrast Angiogram Was Performed on 2 Separate Occasions and It Appeared That Potentially the Iliac Artery Was Occluded. At This Point I Turned My Attention to the Left Groin. In Similar Fashion Local Anesthesia Was Infused over the Left Common Femoral Artery. I Attempted to Find the Proximal Anastomosis of the Femoropopliteal Bypass to Avoid Sticking This. I Did Find a Good Window Just above the Femoropopliteal Bypass and I Was Able to Cannulate the Artery Using Ultrasound Guidance Area No 35 Versacore Wire Was Threaded  up into the Left Hemipelvis. 5 [place] Sheath Placed over This. With Some Manipulation I Was Able to Advance the Guidewire up into the Abdominal Aorta under Fluoroscopic Guidance. At This Point a 5 [place] Omni Flush Catheter Was Advanced over the Guidewire into the Abdominal Aorta and Abdominal Aortogram Obtained in AP Projection. The Left and Right Renal Arteries Are Patent. The Ends Renal Abdominal Aorta Is Patent. Left and Right Common Iliac Arteries Are Calcified but Patent. The Catheter Was Pulled Underside of the Aortic Bifurcation and Magnified Views of the Pelvis Were Performed. This Was Also Done in a RAO and LAO Projection. The Left External/Internal and Common Iliac Arteries Are Widely Patent Although Calcified. On the Right Side the Right Common Iliac Artery Is Patent. There Are 2 Exophytic Calcified Areas of Atherosclerotic Plaque in the Proximal Right External Iliac Artery the Internal Iliac Artery Is Patent. These 2 Areas of Plaque Obscure the Lumen and Were Most Likely the Reason for the Guidewire and Advancing. It Is Difficult to Determine Precise Folds Stenosis but the 2 Areas of Plaque Combined Together Probably Combined to Make a 70% Stenosis. At This Point Bilateral Lower Extremity Runoff Views Were Obtained through the [facility] Flush Catheter.  In the Right Lower Extremity, the Right Common Femoral Artery Is Patent. The Right Profunda Femoris Artery Is Patent. There Is a Bypass Graft Extending from the Right Common Femoral Artery to the Above-Knee Popliteal Artery and This Is Patent. The Native Right Superficial Femoral Artery Is Occluded. The Popliteal Artery below the Level of the Anastomosis Is Occluded. The Bypass Graft That Had Been Done from the Distal Portion of the Above-Knee Popliteal Bypass to the Below-Knee Popliteal Artery Is Occluded.  There Is Reconstitution of the Tibioperoneal Trunk Via Collaterals. The Posterior Tibial and Peroneal Arteries Refill All the Way to the End of the Leg.  The Anterior Tibial Artery Is Occluded.  In the Left Lower Extremity, the Left Common Femoral Artery Is Patent. The Left Profunda Femoris Artery Is Patent. There Is a Bypass Graft Extending from the Left Common Femoral Artery to the Below-Knee Popliteal Artery. This Is Widely Patent. There Is Three-Vessel Runoff to the Left Foot. 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  At This Point the Omni Flush Catheter Was Removed over a Guidewire. Both 5 [place] Sheaths Were Removed and Hemostasis Obtained with Direct Pressure. The Patient Tolerated Procedure Well and There Were No Complications.  Operative Management: The Patient's Images Will Be Reviewed by Dr. Donnetta Hutching Who Is Patient's Primary Last Surgeon. He'll Make a Determination Whether or Not Redo Operation for Conservative Measures Would Be Best in This Situation.  Ruta Hinds, MD [facility] Office: [phone] Pager: [phone]

## 2015-12-01 NOTE — H&P (View-Only) (Signed)
[facility] [place]   CC: Follow up peripheral artery occlusive disease  History of Present Illness Brenda [name] is a 65 y.o. female patient of Dr.Early who is s/p right femoral to popliteal bypas graft 06/14/97 and then a revision with extension from prior above knee popliteal anastomosis to below knee popliteal artery with reversed small saphenous vein on 02/05/13. She also has a hx of left femoral to popliteal bypass graft with translocated non-reversed saphenous vein on 06/10/1998.   She returns today with c/o sore that came up spontaneously 2 months ago on the lateral aspect of her right lower leg. Pain in right toes started 2 weeks ago, wakes her up at night, is rotating takingTylenol and ibuprofen. Pt states she has been treating the right leg sore with betadine and Neosporin.  She reports chills for the last couple of weeks.  She had a heart cath in May 2017 and 1 stent placed after evaluation for chest discomfort.  She was having right calf pain after walking about 8 minutes at her February 2017 visit , seemed worse to her. She has been walking less since her many URI's. She has numbness in both feet at times, worse in the right foot. She denies claudication pain in left leg. She had a stroke in 2000 as manifested by left hemiparesis, expressive aphasia has resolved, has had several TIA's since then with slurred speech and trouble writing with her right hand, the symptoms resolve quickly, last episode was about 2013.   07/06/12 CT soft tissue neck: Atherosclerotic calcifications are present at the carotid bifurcations bilaterally without a significant stenosis.  She had an MI in 2005, states mild. She states she has an appointment with her cardiologist in April 2017.   The patient states she has had pneumonia 5x since March of 2016, and a couple of cases of self treated bronchitis. Pt states it was found that she had aspiration pneumonia and has taken measures to  prevent this. She lost weight with these URI's. 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  Pt Diabetic: Yes,  A1C in May 2017 was 8.9 (review of records), increased from previous 7.6, is yet uncontrolled. She started seeing Dr. Buddy Duty, endocrinologist, in February 2017. Pt smoker: smoker (13-14 cigarettes/day, started at age 24 yrs); she has attended smoking cessation classes 3 times, is taking Wellbutrin now which has not decreased her craving, she gets very depressed when not smoking, states she was on a higher dose previously.   Pt meds include: Statin :Yes Other anticoagulants/antiplatelets: Plavix    Past Medical History  Diagnosis Date  . CAD S/P percutaneous coronary angioplasty 1999; 2001;2003, 10/2014    a) BMS- mCx; b) re-do PCI for prog of Dz (NIR BMS 3.5 x 12); c) staged PCI: p-mRCA Express II BMS 3.0x24 & 3.5 x 16 --> p-mLAD Cyper DES 3.0 x 13 & PTCA of D2 (2.5 balloon); 10/2015 PCI p-m LAD Promus DES 2.75 x 38 (covers pre&post-stent ISR), POBA of ostial rPDA  . Myocardial infarction [facility]) 1999, 2003  . PAD (peripheral artery disease) ([name])     status post bilateral femoropopliteal bypass grafting --> Dr. Sherren Mocha Early; -  02/2013 Revision of R Fem-AKPop to BK Pop.  . Hypercholesteremia   . Type II diabetes mellitus ([place])   . Transient ischemic attack (TIA) February 2013  . Stroke [facility])     left side weakness  remains (11/26/2012)  . Carotid artery occlusion   . History of DVT (deep vein thrombosis)   . COPD (chronic obstructive pulmonary disease) with emphysema ([place])   . Chronic bronchitis ([place])   . Pneumonia   . Depression   . Hx of radiation therapy   . History of blood transfusion   . GERD (gastroesophageal reflux disease)   . H/O hiatal hernia   . Arthritis     "hands" (11/26/2012)  . Cancer of base of tongue ([place]) 2010    "& lymph nodes @ right neck" (11/26/2012  . Hx of radiation  therapy 01/16/09 - 03/06/09    base of tongue, right neck node  . Patent foramen ovale February 2013    Small. Small PFO noted on TTE for TIA/CVA R-L + Bubble study with valsalva    Social History Social History  Substance Use Topics  . Smoking status: Light Tobacco Smoker -- 1.00 packs/day for 50 years    Types: Cigarettes  . Smokeless tobacco: Never Used     Comment: down to 8 cigarettes daily 11/13/15  . Alcohol Use: 3.0 oz/week    5 Glasses of wine per week    Family History Family History  Problem Relation Age of Onset  . Cancer Mother     pancreatic  . Diabetes Mother   . Hyperlipidemia Mother   . Hypertension Mother   . Other Mother     varicose vein  . Cancer Father 72    throat  . Heart disease Father   . Heart attack Father   . Cancer Sister     breast  . Diabetes Sister   . Deep vein thrombosis Brother   . Diabetes Brother   . Hearing loss Brother   . Hyperlipidemia Brother   . Hypertension Brother   . Heart attack Brother   . Clotting disorder Brother   . Diabetes Son   . Hyperlipidemia Son     Past Surgical History  Procedure Laterality Date  . Tee without cardioversion  07/26/2011    Procedure: TRANSESOPHAGEAL ECHOCARDIOGRAM (TEE);  Surgeon: Pixie Casino, MD;  Location: [facility] ENDOSCOPY;  Service: Cardiovascular;  Laterality: N/A;  . Esophagogastroduodenoscopy  12/20/2011    Procedure: ESOPHAGOGASTRODUODENOSCOPY (EGD);  Surgeon: Beryle Beams, MD;  Location: [facility];  Service: Endoscopy;  Laterality: N/A;  EGD with balloon dilation  . Savory dilation  12/20/2011    Procedure: SAVORY DILATION;  Surgeon: Beryle Beams, MD;  Location: [facility] ENDOSCOPY;  Service: Endoscopy;  Laterality: N/A;  . Tonsillectomy  1969  . Appendectomy  1962  . Cardiac catheterization  1999; 10/2005    5/'07: mildLAD 20-30%, patent stent, diags- no Dz. RCA 2 BMS patent ~20% ISR.   Marland Kitchen Coronary angioplasty with stent placement  1999--2005    1999 - OM2 BMS; 2001: p-mCx NIR BMS  3.5 x 12; 10/2001: p-mRCA Express II BMS - 3.5 x 16 - 3.0 x 24.   . Femoral bypass  1999; 2000    Bilateral Fem-AK POP bypass.   . Carpal tunnel release Right 1980's?  . Vaginal hysterectomy  1988  . Cesarean section  1978  . Tubal ligation  1988  . Cataract extraction w/ intraocular lens implant Right 2011  . Ankle fracture surgery Right 2007    "got a metal plate and 8 screws in it" (11/26/2012)  . Bypass graft popliteal to popliteal Right 02/05/2013    Procedure: BYPASS GRAFT ABOVE KNEE POPLITEAL TO BELOW KNEE POPLITEAL WITH SMALL  SAPHANOUS VEIN;  Surgeon: Rosetta Posner, MD;  Location: [place];  Service: Vascular;  Laterality: Right;  . Eye surgery    . Cardiac catheterization N/A 10/06/2015    Procedure: Left Heart Cath and Coronary Angiography;  Surgeon: Leonie Man, MD;  Location: [place] CV LAB;  Service: Cardiovascular; p-mLAD 99% pre-stent, 60% ISR & 80% post-stent, o-RPDA 90%, ~10% ISR in 2 RCA (Prox & mid) BMS,~20% ISR in pCx BMS with 5% OM2 BMS.    . Cardiac catheterization N/A 10/06/2015    Procedure: Coronary Stent Intervention;  Surgeon: Leonie Man, MD;  Location: [place] CV [facility];  Service: Cardiovascular; p-m LAD Promus DES 2.75 x 38 (tapered post-dilatoin)  . Cardiac catheterization N/A 10/06/2015    Procedure: Coronary Balloon Angioplasty;  Surgeon: Leonie Man, MD;  Location: [place] CV [facility];  Service: Cardiovascular;  POBA of Ostial rPDA.  . Transthoracic echocardiogram  March 2016    Normal LV size and function EF 55-60%. No regional wall motion and amount is. No significant valvular lesions.    Allergies  Allergen Reactions  . Tape Hives and Other (See Comments)    USE PAPER TAPE ONLY- Adhesive peels off skin-makes pt. Raw.  . Isosorbide Other (See Comments)    Drops BP too low  . Nicoderm [Nicotine] Rash    To the PATCH only, breakouts on skin    Current Outpatient Prescriptions  Medication Sig Dispense Refill  . acetaminophen (TYLENOL) 500 MG  tablet Take 1,000 mg by mouth daily as needed for moderate pain.     Marland Kitchen albuterol (PROVENTIL HFA;VENTOLIN HFA) 108 (90 BASE) MCG/ACT inhaler Inhale 2 puffs into the lungs every 6 (six) hours as needed for wheezing or shortness of breath. 1 Inhaler 0  . aspirin EC 81 MG tablet Take 1 tablet (81 mg total) by mouth daily. 30 tablet 10  . buPROPion (WELLBUTRIN XL) 300 MG 24 hr tablet Take 300 mg by mouth daily.     . clopidogrel (PLAVIX) 75 MG tablet Take 1 tablet (75 mg total) by mouth daily. 30 tablet 12  . docusate sodium (COLACE) 100 MG capsule Take 100 mg by mouth daily.    . fish oil-omega-3 fatty acids 1000 MG capsule Take 1 g by mouth 2 (two) times daily.     . fluticasone (FLONASE) 50 MCG/ACT nasal spray Place 2 sprays into both nostrils daily. 16 g 2  . fluticasone furoate-vilanterol (BREO ELLIPTA) 100-25 MCG/INH AEPB Inhale 1 puff into the lungs daily. 60 each 5  . guaiFENesin (MUCINEX) 600 MG 12 hr tablet Take 2 tablets (1,200 mg total) by mouth 2 (two) times daily. 60 tablet 3  . insulin lispro (HUMALOG) 100 UNIT/ML injection Inject 3 Units into the skin 3 (three) times daily with meals as needed for high blood sugar. Inject  3 units subcutaneous as needed when blood sugar is high per patient    . ipratropium-albuterol (DUONEB) 0.5-2.5 (3) MG/3ML SOLN Take 3 mLs by nebulization every 6 (six) hours. (Patient taking differently: Take 3 mLs by nebulization 2 (two) times daily as needed (for wheezingt or shortness of breath). ) 360 mL 3  . ipratropium-albuterol (DUONEB) 0.5-2.5 (3) MG/3ML SOLN Take 3 mLs by nebulization every 4 (four) hours as needed. 360 mL 0  . loratadine (CLARITIN) 10 MG tablet Take 1 tablet (10 mg total) by mouth daily. 30 tablet 0  . Multiple Vitamins-Minerals (MULTIVITAMIN & MINERAL PO) Take 1 tablet by mouth daily.    Marland Kitchen  nitroGLYCERIN (NITROLINGUAL) 0.4 MG/SPRAY spray Place 1 spray under the tongue every 5 (five) minutes as needed for chest pain. 12 g 0  . omeprazole  (PRILOSEC) 20 MG capsule Take 20 mg by mouth daily.     . rosuvastatin (CRESTOR) 20 MG tablet Take 20 mg by mouth daily.     No current facility-administered medications for this visit.    ROS: See HPI for pertinent positives and negatives.   Physical Examination  Filed Vitals:   11/14/15 1037  BP: 116/64  Pulse: 90  Temp: 97.9 F (36.6 C)  Resp: 16  Height: '5\' 6"'$  (1.676 m)  Weight: 121 lb (54.885 kg)  SpO2: 96%   Body mass index is 19.54 kg/(m^2).  General: A&O x 3, WDWN, thin female. Gait: antalgic, favors right foot Eyes: PERRLA. Pulmonary: limited air movement in all fields, respirations are mildly labored at rest with moist cough  Cardiac: regular rhythm, no detected murmur.     Carotid Bruits Left Right   Negative positive  Aorta is not palpable. Radial pulses: 2+ palpable right, 1+ palpable left   VASCULAR EXAM: Extremities with ischemic changes: all right toes are ruddy, without Gangrene; with open wounds: 1.3 cm x 1 cm shallow ulcer, no drainage, right lower leg, lateral aspect.     LE Pulses LEFT RIGHT   FEMORAL 2+ palpable 1+ palpable    POPLITEAL 2+ palpable  not palpable   POSTERIOR TIBIAL not palpable, Moderate Doppler signal  not palpable, brisk Doppler signal    DORSALIS PEDIS  ANTERIOR TIBIAL not palpable, moderate Doppler signal  not palpable, muffled Doppler signal    Abdomen: soft, NT, no palpable masses. Skin: no rashes, see Extremities. Musculoskeletal: no muscle wasting or atrophy. Moderate kyphosis. Neurologic: A&O X 3; Appropriate Affect ; SENSATION: normal; MOTOR FUNCTION: moving all extremities equally, motor strength 5/5 throughout. Speech is fluent/normal. CN 2-12  intact.                 Non-Invasive Vascular Imaging: DATE: 11/03/15 at [facility]:  ABI:  RIGHT: 0.52 (0.48, 07/25/15), Waveforms: monophasic; TBI: 0.16 LEFT: 1.1 (1.1), Waveforms: triphasic. TBI: 0.49   ASSESSMENT: Brenda [name] is a 65 y.o. female who is s/p right femoral to popliteal bypas graft 06/14/97 and then a revision with extension from prior above knee popliteal anastomosis to below knee popliteal artery with reversed small saphenous vein on 02/05/13. She also has a hx of left femoral to popliteal bypass graft with translocated non-reversed saphenous vein on 06/10/1998.   She had a stroke in 2000 as manifested by left hemiparesis, expressive aphasia has resolved, has had several TIA's since then with slurred speech and trouble writing with her right hand, the symptoms resolve quickly, last episode was about 2013.   06/18/14 carotid Duplex suggested bilateral internal carotid artery stenosis less than 40%, essentially unchanged when compared to previous outside study performed at [facility] 07/25/2011.  She returns today with c/o sore that came up spontaneously 2 months ago on the lateral aspect of her right lower leg. Pain in right toes started 2 weeks ago, wakes her up at night, is rotating takingTylenol and ibuprofen. Pt states she has been treating the right leg sore with betadine and Neosporin.  She reports chills for the last couple of weeks.  Right ABI is stable compared to February 2017, with moderate arterial flow reduction, monophasic waveforms. Left ABI remains normal with triphasic waveforms.   10/07/15 creatinine was 0.7. Dr. Donnetta Hutching spoke with and examined pt.  Face to face time with patient was 25 minutes. Over 50% of this time was spent on counseling and coordination of care.    PLAN:  Based on the patient's vascular studies and examination, pt will be scheduled for angiogram with bilateral run off, possible intervention, Dr. Donnetta Hutching if possible, at pt convenience but  soon.  The patient was counseled re smoking cessation and given several free resources re smoking cessation.  I discussed in depth with the patient the nature of atherosclerosis, and emphasized the importance of maximal medical management including strict control of blood pressure, blood glucose, and lipid levels, obtaining regular exercise, and cessation of smoking.  The patient is aware that without maximal medical management the underlying atherosclerotic disease process will progress, limiting the benefit of any interventions.  The patient was given information about PAD including signs, symptoms, treatment, what symptoms should prompt the patient to seek immediate medical care, and risk reduction measures to take.  Clemon Chambers, RN, MSN, FNP-C [facility] Phone: [phone]  Clinic MD: Early  11/14/2015 10:51 AM

## 2015-12-01 NOTE — Progress Notes (Signed)
Pt has COPD and is wheezing and has a deep cough. Dr Oneida Alar was called and orders followed.

## 2015-12-01 NOTE — Discharge Instructions (Signed)
Angiogram, Care After °Refer to this sheet in the next few weeks. These instructions provide you with information about caring for yourself after your procedure. Your health care provider may also give you more specific instructions. Your treatment has been planned according to current medical practices, but problems sometimes occur. Call your health care provider if you have any problems or questions after your procedure. °WHAT TO EXPECT AFTER THE PROCEDURE °After your procedure, it is typical to have the following: °· Bruising at the catheter insertion site that usually fades within 1-2 weeks. °· Blood collecting in the tissue (hematoma) that may be painful to the touch. It should usually decrease in size and tenderness within 1-2 weeks. °HOME CARE INSTRUCTIONS °· Take medicines only as directed by your health care provider. °· You may shower 24-48 hours after the procedure or as directed by your health care provider. Remove the bandage (dressing) and gently wash the site with plain soap and water. Pat the area dry with a clean towel. Do not rub the site, because this may cause bleeding. °· Do not take baths, swim, or use a hot tub until your health care provider approves. °· Check your insertion site every day for redness, swelling, or drainage. °· Do not apply powder or lotion to the site. °· Do not lift over 10 lb (4.5 kg) for 5 days after your procedure or as directed by your health care provider. °· Ask your health care provider when it is okay to: °¨ Return to work or school. °¨ Resume usual physical activities or sports. °¨ Resume sexual activity. °· Do not drive home if you are discharged the same day as the procedure. Have someone else drive you. °· You may drive 24 hours after the procedure unless otherwise instructed by your health care provider. °· Do not operate machinery or power tools for 24 hours after the procedure or as directed by your health care provider. °· If your procedure was done as an  outpatient procedure, which means that you went home the same day as your procedure, a responsible adult should be with you for the first 24 hours after you arrive home. °· Keep all follow-up visits as directed by your health care provider. This is important. °SEEK MEDICAL CARE IF: °· You have a fever. °· You have chills. °· You have increased bleeding from the catheter insertion site. Hold pressure on the site. °SEEK IMMEDIATE MEDICAL CARE IF: °· You have unusual pain at the catheter insertion site. °· You have redness, warmth, or swelling at the catheter insertion site. °· You have drainage (other than a small amount of blood on the dressing) from the catheter insertion site. °· The catheter insertion site is bleeding, and the bleeding does not stop after 30 minutes of holding steady pressure on the site. °· The area near or just beyond the catheter insertion site becomes pale, cool, tingly, or numb. °  °This information is not intended to replace advice given to you by your health care provider. Make sure you discuss any questions you have with your health care provider. °  °Document Released: 12/06/2004 Document Revised: 06/10/2014 Document Reviewed: 10/21/2012 °Elsevier Interactive Patient Education ©2016 [employer] ° °

## 2015-12-02 ENCOUNTER — Telehealth: Payer: Self-pay | Admitting: Vascular Surgery

## 2015-12-02 NOTE — Telephone Encounter (Signed)
-----   Message from Mena Goes, RN sent at 12/01/2015  2:00 PM EDT ----- Regarding: 2 weeks with Dr. Donnetta Hutching to discuss arteriogram   ----- Message -----    From: Rosetta Posner, MD    Sent: 12/01/2015   1:52 PM      To: [facility] Charge Pool  Had arteriogram today with Dr. Oneida Alar. Need to see her in the office in couple weeks for wound check and to discuss arteriogram

## 2015-12-02 NOTE — Telephone Encounter (Signed)
Sched appt 7/18 at 11:45. Lm on hm# to inform pt.

## 2015-12-04 ENCOUNTER — Other Ambulatory Visit: Payer: Self-pay | Admitting: Family Medicine

## 2015-12-04 ENCOUNTER — Encounter (HOSPITAL_COMMUNITY): Payer: Self-pay | Admitting: Vascular Surgery

## 2015-12-04 DIAGNOSIS — R928 Other abnormal and inconclusive findings on diagnostic imaging of breast: Secondary | ICD-10-CM

## 2015-12-12 ENCOUNTER — Ambulatory Visit
Admission: RE | Admit: 2015-12-12 | Discharge: 2015-12-12 | Disposition: A | Discharge status: Home / Self Care | Payer: Medicare Other | Source: Ambulatory Visit | Attending: Family Medicine | Admitting: Family Medicine

## 2015-12-12 DIAGNOSIS — R928 Other abnormal and inconclusive findings on diagnostic imaging of breast: Secondary | ICD-10-CM

## 2015-12-14 ENCOUNTER — Encounter: Payer: Self-pay | Admitting: Family

## 2015-12-19 ENCOUNTER — Other Ambulatory Visit: Payer: Self-pay

## 2015-12-19 ENCOUNTER — Encounter: Payer: Self-pay | Admitting: Vascular Surgery

## 2015-12-19 ENCOUNTER — Ambulatory Visit (INDEPENDENT_AMBULATORY_CARE_PROVIDER_SITE_OTHER): Payer: Medicare Other | Admitting: Vascular Surgery

## 2015-12-19 VITALS — BP 121/65 | HR 82 | Temp 98.2°F | Resp 18 | Ht 66.0 in | Wt 123.0 lb

## 2015-12-19 DIAGNOSIS — M79671 Pain in right foot: Secondary | ICD-10-CM | POA: Diagnosis not present

## 2015-12-19 DIAGNOSIS — I999 Unspecified disorder of circulatory system: Secondary | ICD-10-CM | POA: Diagnosis not present

## 2015-12-19 DIAGNOSIS — I998 Other disorder of circulatory system: Secondary | ICD-10-CM

## 2015-12-19 NOTE — Progress Notes (Signed)
Vascular and Vein Specialist of [place]  Patient name: Brenda [name] MRN: [id number] DOB: 10-Apr-1951 Sex: female  REASON FOR VISIT: Follow-up of right lower extremity arterial insufficiency  HPI: Brenda [name] is a 65 y.o. female here today for discussion of recent diagnostic arteriogram at [facility] on 12/01/2015. She has a long history dating back to the late 1990s of lower extremity arterial insufficiency in bypass. She underwent arteriogram which showed high-grade right external iliac artery stenosis. She had wide patency of her left femoral to below-knee popliteal bypass. She had patency of her prior right femoral to above-knee popliteal bypass. She had subsequently undergone a revision with a right above-knee to below-knee popliteal bypass with small saphenous vein. This jump has occluded. She continues to have discomfort and rest pain in her right foot. She has a stable ulceration over the lateral aspect of her right calf which is healing.  Past Medical History  Diagnosis Date  . CAD S/P percutaneous coronary angioplasty 1999; 2001;2003, 10/2014    a) BMS- mCx; b) re-do PCI for prog of Dz (NIR BMS 3.5 x 12); c) staged PCI: p-mRCA Express II BMS 3.0x24 & 3.5 x 16 --> p-mLAD Cyper DES 3.0 x 13 & PTCA of D2 (2.5 balloon); 10/2015 PCI p-m LAD Promus DES 2.75 x 38 (covers pre&post-stent ISR), POBA of ostial rPDA  . Myocardial infarction [facility]) 1999, 2003  . PAD (peripheral artery disease) ([name])     status post bilateral femoropopliteal bypass grafting --> Dr. Sherren Mocha Rhyder Bratz; -  02/2013 Revision of R Fem-AKPop to BK Pop.  . Hypercholesteremia   . Type II diabetes mellitus ([place])   . Transient ischemic attack (TIA) February 2013  . Stroke [facility])     left side weakness remains (11/26/2012)  . Carotid artery occlusion   . History of DVT (deep vein thrombosis)   . COPD (chronic obstructive pulmonary disease) with emphysema ([place])   . Chronic bronchitis ([place])    . Pneumonia   . Depression   . Hx of radiation therapy   . History of blood transfusion   . GERD (gastroesophageal reflux disease)   . H/O hiatal hernia   . Arthritis     "hands" (11/26/2012)  . Cancer of base of tongue ([place]) 2010    "& lymph nodes @ right neck" (11/26/2012  . Hx of radiation therapy 01/16/09 - 03/06/09    base of tongue, right neck node  . Patent foramen ovale February 2013    Small. Small PFO noted on TTE for TIA/CVA R-L + Bubble study with valsalva    Family History  Problem Relation Age of Onset  . Cancer Mother     pancreatic  . Diabetes Mother   . Hyperlipidemia Mother   . Hypertension Mother   . Other Mother     varicose vein  . Cancer Father 72    throat  . Heart disease Father   . Heart attack Father   . Cancer Sister     breast  . Diabetes Sister   . Deep vein thrombosis Brother   . Diabetes Brother   . Hearing loss Brother   . Hyperlipidemia Brother   . Hypertension Brother   . Heart attack Brother   . Clotting disorder Brother   . Diabetes Son   . Hyperlipidemia Son     SOCIAL HISTORY: Social History  Substance Use Topics  . Smoking status: Current Every Day Smoker -- 0.50 packs/day for 50 years  Types: Cigarettes  . Smokeless tobacco: Never Used     Comment: down to 8 cigarettes daily 11/13/15  . Alcohol Use: 3.0 oz/week    5 Glasses of wine per week    Allergies  Allergen Reactions  . Tape Hives and Other (See Comments)    USE PAPER TAPE ONLY- Adhesive peels off skin-makes pt. Raw.  . Isosorbide Other (See Comments)    Drops BP too low  . Nicoderm [Nicotine] Rash    To the PATCH only, breakouts on skin    Current Outpatient Prescriptions  Medication Sig Dispense Refill  . acetaminophen (TYLENOL) 500 MG tablet Take 1,000 mg by mouth 2 (two) times daily as needed for moderate pain or headache.     . albuterol (PROVENTIL HFA;VENTOLIN HFA) 108 (90 BASE) MCG/ACT inhaler Inhale 2 puffs into the lungs every 6 (six) hours as  needed for wheezing or shortness of breath. 1 Inhaler 0  . aspirin EC 81 MG tablet Take 1 tablet (81 mg total) by mouth daily. 30 tablet 10  . buPROPion (WELLBUTRIN XL) 300 MG 24 hr tablet Take 300 mg by mouth daily.     . clopidogrel (PLAVIX) 75 MG tablet Take 1 tablet (75 mg total) by mouth daily. 30 tablet 12  . docusate sodium (COLACE) 100 MG capsule Take 100 mg by mouth daily.    . fish oil-omega-3 fatty acids 1000 MG capsule Take 1 g by mouth 2 (two) times daily.     . fluticasone (FLONASE) 50 MCG/ACT nasal spray Place 2 sprays into both nostrils daily. 16 g 2  . fluticasone furoate-vilanterol (BREO ELLIPTA) 100-25 MCG/INH AEPB Inhale 1 puff into the lungs daily. 60 each 5  . guaiFENesin (MUCINEX) 600 MG 12 hr tablet Take 2 tablets (1,200 mg total) by mouth 2 (two) times daily. 60 tablet 3  . insulin lispro (HUMALOG) 100 UNIT/ML injection Inject 3 Units into the skin 3 (three) times daily with meals as needed for high blood sugar.     . ipratropium-albuterol (DUONEB) 0.5-2.5 (3) MG/3ML SOLN Take 3 mLs by nebulization every 6 (six) hours. (Patient taking differently: Take 3 mLs by nebulization 2 (two) times daily as needed (for wheezingt or shortness of breath). ) 360 mL 3  . loratadine (CLARITIN) 10 MG tablet Take 1 tablet (10 mg total) by mouth daily. 30 tablet 0  . Multiple Vitamins-Minerals (MULTIVITAMIN & MINERAL PO) Take 1 tablet by mouth daily.    Marland Kitchen neomycin-bacitracin-polymyxin (NEOSPORIN) ointment Apply 1 application topically every 12 (twelve) hours. For open wound    . nitroGLYCERIN (NITROLINGUAL) 0.4 MG/SPRAY spray Place 1 spray under the tongue every 5 (five) minutes as needed for chest pain. 12 g 0  . omeprazole (PRILOSEC) 20 MG capsule Take 20 mg by mouth daily.     . rosuvastatin (CRESTOR) 20 MG tablet Take 20 mg by mouth daily.    Marland Kitchen SPIRIVA RESPIMAT 2.5 MCG/ACT AERS 2 puffs daily.     No current facility-administered medications for this visit.    Review of systems  unchanged from her recent office visit   PHYSICAL EXAM: Filed Vitals:   12/19/15 1146  BP: 121/65  Pulse: 82  Temp: 98.2 F (36.8 C)  TempSrc: Oral  Resp: 18  Height: '5\' 6"'$  (1.676 m)  Weight: 123 lb (55.792 kg)  SpO2: 96%    GENERAL: The patient is a well-nourished female, in no acute distress. The vital signs are documented above.  VASCULAR: Palpable but diminished right femoral pulse. 2-3+  left femoral pulse PULMONARY: There is good air exchange bilaterally without wheezing or rales. ABDOMEN: Soft and non-tender  MUSCULOSKELETAL: There are no major deformities or cyanosis. NEUROLOGIC: No focal weakness or paresthesias are detected. SKIN: 2-3 cm open ulceration in her right lateral calf with a clean base PSYCHIATRIC: The patient has a normal affect.  DATA:  I reviewed her arteriogram in detail with the patient. This shows the above findings  MEDICAL ISSUES: Continued rest pain and slow healing of her right lateral wound. Explained that this most likely is related to a combination of inflow disease and occlusion of her popliteal artery below her above-knee femoropopliteal bypass. She does have good tibial runoff. I have recommended attempt at crossing her iliac stenosis. Feel that this would be the most likely chance for improving inflow and resolving her rest pain allowing her to heal her calf wound. Would obviously need better inflow before she would have any future consideration of revision of her right femoral-popliteal bypass. She understands the possibility of inability to cross this lesion but I feel that it is reasonable to attempt this as our next intervention.    Rosetta Posner, MD FACS [facility] and Vein Specialists of [facility] Tel [phone] Pager [phone]

## 2015-12-22 ENCOUNTER — Other Ambulatory Visit: Payer: Self-pay | Admitting: *Deleted

## 2015-12-22 ENCOUNTER — Encounter (HOSPITAL_COMMUNITY): Payer: Self-pay | Admitting: Vascular Surgery

## 2015-12-22 ENCOUNTER — Encounter (HOSPITAL_COMMUNITY)
Admission: RE | Disposition: A | Discharge status: Home / Self Care | Payer: Self-pay | Source: Ambulatory Visit | Attending: Vascular Surgery

## 2015-12-22 ENCOUNTER — Ambulatory Visit (HOSPITAL_COMMUNITY)
Admission: RE | Admit: 2015-12-22 | Discharge: 2015-12-22 | Disposition: A | Discharge status: Home / Self Care | Payer: Medicare Other | Source: Ambulatory Visit | Attending: Vascular Surgery | Admitting: Vascular Surgery

## 2015-12-22 DIAGNOSIS — I2510 Atherosclerotic heart disease of native coronary artery without angina pectoris: Secondary | ICD-10-CM | POA: Diagnosis not present

## 2015-12-22 DIAGNOSIS — E119 Type 2 diabetes mellitus without complications: Secondary | ICD-10-CM | POA: Insufficient documentation

## 2015-12-22 DIAGNOSIS — E7800 Pure hypercholesterolemia, unspecified: Secondary | ICD-10-CM | POA: Insufficient documentation

## 2015-12-22 DIAGNOSIS — I70221 Atherosclerosis of native arteries of extremities with rest pain, right leg: Secondary | ICD-10-CM

## 2015-12-22 DIAGNOSIS — I252 Old myocardial infarction: Secondary | ICD-10-CM | POA: Insufficient documentation

## 2015-12-22 DIAGNOSIS — Z86718 Personal history of other venous thrombosis and embolism: Secondary | ICD-10-CM | POA: Diagnosis not present

## 2015-12-22 DIAGNOSIS — Z48812 Encounter for surgical aftercare following surgery on the circulatory system: Secondary | ICD-10-CM

## 2015-12-22 DIAGNOSIS — Z794 Long term (current) use of insulin: Secondary | ICD-10-CM | POA: Diagnosis not present

## 2015-12-22 DIAGNOSIS — Z7982 Long term (current) use of aspirin: Secondary | ICD-10-CM | POA: Diagnosis not present

## 2015-12-22 DIAGNOSIS — Z85810 Personal history of malignant neoplasm of tongue: Secondary | ICD-10-CM | POA: Diagnosis not present

## 2015-12-22 DIAGNOSIS — I739 Peripheral vascular disease, unspecified: Secondary | ICD-10-CM

## 2015-12-22 DIAGNOSIS — F17210 Nicotine dependence, cigarettes, uncomplicated: Secondary | ICD-10-CM | POA: Insufficient documentation

## 2015-12-22 DIAGNOSIS — Z923 Personal history of irradiation: Secondary | ICD-10-CM | POA: Diagnosis not present

## 2015-12-22 DIAGNOSIS — Z8673 Personal history of transient ischemic attack (TIA), and cerebral infarction without residual deficits: Secondary | ICD-10-CM | POA: Diagnosis not present

## 2015-12-22 DIAGNOSIS — J449 Chronic obstructive pulmonary disease, unspecified: Secondary | ICD-10-CM | POA: Diagnosis not present

## 2015-12-22 DIAGNOSIS — I70201 Unspecified atherosclerosis of native arteries of extremities, right leg: Secondary | ICD-10-CM | POA: Diagnosis present

## 2015-12-22 HISTORY — PX: PERIPHERAL VASCULAR CATHETERIZATION: SHX172C

## 2015-12-22 LAB — POCT I-STAT, CHEM 8
BUN: 11 mg/dL (ref 6–20)
CALCIUM ION: 1.22 mmol/L (ref 1.12–1.23)
CHLORIDE: 97 mmol/L — AB (ref 101–111)
Creatinine, Ser: 0.8 mg/dL (ref 0.44–1.00)
Glucose, Bld: 193 mg/dL — ABNORMAL HIGH (ref 65–99)
HEMATOCRIT: 43 % (ref 36.0–46.0)
Hemoglobin: 14.6 g/dL (ref 12.0–15.0)
Potassium: 4.2 mmol/L (ref 3.5–5.1)
SODIUM: 137 mmol/L (ref 135–145)
TCO2: 29 mmol/L (ref 0–100)

## 2015-12-22 LAB — POCT ACTIVATED CLOTTING TIME
Activated Clotting Time: 197 seconds
Activated Clotting Time: 169 seconds

## 2015-12-22 LAB — GLUCOSE, CAPILLARY: GLUCOSE-CAPILLARY: 163 mg/dL — AB (ref 65–99)

## 2015-12-22 SURGERY — PERIPHERAL VASCULAR INTERVENTION
Laterality: Right

## 2015-12-22 MED ORDER — HEPARIN (PORCINE) IN NACL 2-0.9 UNIT/ML-% IJ SOLN
INTRAMUSCULAR | Status: AC
  Filled 2015-12-22: qty 1000

## 2015-12-22 MED ORDER — MIDAZOLAM HCL 2 MG/2ML IJ SOLN
INTRAMUSCULAR | Status: DC | PRN
Start: 2015-12-22 — End: 2015-12-22
  Administered 2015-12-22: 1 mg via INTRAVENOUS

## 2015-12-22 MED ORDER — HEPARIN (PORCINE) IN NACL 2-0.9 UNIT/ML-% IJ SOLN
INTRAMUSCULAR | Status: DC | PRN
  Administered 2015-12-22: 1000 mL via INTRA_ARTERIAL

## 2015-12-22 MED ORDER — LIDOCAINE HCL (PF) 1 % IJ SOLN
INTRAMUSCULAR | Status: AC
  Filled 2015-12-22: qty 30

## 2015-12-22 MED ORDER — SODIUM CHLORIDE 0.9 % IV SOLN: 250.0000 mL | INTRAVENOUS | Status: DC | PRN

## 2015-12-22 MED ORDER — LIDOCAINE HCL (PF) 1 % IJ SOLN
INTRAMUSCULAR | Status: DC | PRN
Start: 2015-12-22 — End: 2015-12-22
  Administered 2015-12-22: 12 mL
  Administered 2015-12-22: 8 mL

## 2015-12-22 MED ORDER — SODIUM CHLORIDE 0.9% FLUSH: 3.0000 mL | Freq: Two times a day (BID) | INTRAVENOUS | Status: DC

## 2015-12-22 MED ORDER — FENTANYL CITRATE (PF) 100 MCG/2ML IJ SOLN
INTRAMUSCULAR | Status: AC
  Filled 2015-12-22: qty 2

## 2015-12-22 MED ORDER — FENTANYL CITRATE (PF) 100 MCG/2ML IJ SOLN
INTRAMUSCULAR | Status: DC | PRN
  Administered 2015-12-22: 25 ug via INTRAVENOUS

## 2015-12-22 MED ORDER — IODIXANOL 320 MG/ML IV SOLN
INTRAVENOUS | Status: DC | PRN
  Administered 2015-12-22: 20 mL via INTRA_ARTERIAL

## 2015-12-22 MED ORDER — SODIUM CHLORIDE 0.9 % IV SOLN: 1.0000 mL/kg/h | INTRAVENOUS | Status: DC

## 2015-12-22 MED ORDER — HEPARIN SODIUM (PORCINE) 1000 UNIT/ML IJ SOLN
INTRAMUSCULAR | Status: AC
  Filled 2015-12-22: qty 1

## 2015-12-22 MED ORDER — SODIUM CHLORIDE 0.9% FLUSH: 3.0000 mL | INTRAVENOUS | Status: DC | PRN

## 2015-12-22 MED ORDER — MIDAZOLAM HCL 2 MG/2ML IJ SOLN
INTRAMUSCULAR | Status: AC
  Filled 2015-12-22: qty 2

## 2015-12-22 MED ORDER — SODIUM CHLORIDE 0.9 % IV SOLN
INTRAVENOUS | Status: DC
  Administered 2015-12-22: 06:00:00 via INTRAVENOUS

## 2015-12-22 MED ORDER — CLOPIDOGREL BISULFATE 75 MG PO TABS
ORAL_TABLET | ORAL | Status: AC
  Filled 2015-12-22: qty 1

## 2015-12-22 MED ORDER — HEPARIN SODIUM (PORCINE) 1000 UNIT/ML IJ SOLN
INTRAMUSCULAR | Status: DC | PRN
  Administered 2015-12-22: 4000 [IU] via INTRAVENOUS

## 2015-12-22 SURGICAL SUPPLY — 22 items
BALLN ARMADA 5X40X80 (BALLOONS) ×4
BALLOON ARMADA 5X40X80 (BALLOONS) ×1 IMPLANT
CATH ANGIO 5F BER2 65CM (CATHETERS) ×3 IMPLANT
CATH CROSS OVER TEMPO 5F (CATHETERS) ×3 IMPLANT
CATH QUICKCROSS SUPP .035X90CM (MICROCATHETER) ×3 IMPLANT
CATH SOFT-VU 4F 65 STRAIGHT (CATHETERS) ×1 IMPLANT
CATH SOFT-VU STRAIGHT 4F 65CM (CATHETERS) ×4
COVER PRB 48X5XTLSCP FOLD TPE (BAG) ×1 IMPLANT
COVER PROBE 5X48 (BAG) ×4
DEVICE CONTINUOUS FLUSH (MISCELLANEOUS) ×3 IMPLANT
GUIDEWIRE ANGLED .035X150CM (WIRE) ×3 IMPLANT
KIT ENCORE 26 ADVANTAGE (KITS) ×3 IMPLANT
KIT PV (KITS) ×4 IMPLANT
SHEATH FLEX ANSEL ANG 6F 45CM (SHEATH) ×3 IMPLANT
SHEATH PINNACLE 5F 10CM (SHEATH) ×6 IMPLANT
STENT EXPRESS LD 6X27X75 (Permanent Stent) ×3 IMPLANT
SYR MEDRAD MARK V 150ML (SYRINGE) ×4 IMPLANT
TRANSDUCER W/STOPCOCK (MISCELLANEOUS) ×4 IMPLANT
TRAY PV CATH (CUSTOM PROCEDURE TRAY) ×4 IMPLANT
WIRE BENTSON .035X145CM (WIRE) ×3 IMPLANT
WIRE HITORQ VERSACORE ST 145CM (WIRE) ×3 IMPLANT
WIRE ROSEN-J .035X260CM (WIRE) ×3 IMPLANT

## 2015-12-22 NOTE — Discharge Instructions (Signed)
Angiogram, Care After °Refer to this sheet in the next few weeks. These instructions provide you with information about caring for yourself after your procedure. Your health care provider may also give you more specific instructions. Your treatment has been planned according to current medical practices, but problems sometimes occur. Call your health care provider if you have any problems or questions after your procedure. °WHAT TO EXPECT AFTER THE PROCEDURE °After your procedure, it is typical to have the following: °· Bruising at the catheter insertion site that usually fades within 1-2 weeks. °· Blood collecting in the tissue (hematoma) that may be painful to the touch. It should usually decrease in size and tenderness within 1-2 weeks. °HOME CARE INSTRUCTIONS °· Take medicines only as directed by your health care provider. °· You may shower 24-48 hours after the procedure or as directed by your health care provider. Remove the bandage (dressing) and gently wash the site with plain soap and water. Pat the area dry with a clean towel. Do not rub the site, because this may cause bleeding. °· Do not take baths, swim, or use a hot tub until your health care provider approves. °· Check your insertion site every day for redness, swelling, or drainage. °· Do not apply powder or lotion to the site. °· Do not lift over 10 lb (4.5 kg) for 5 days after your procedure or as directed by your health care provider. °· Ask your health care provider when it is okay to: °¨ Return to work or school. °¨ Resume usual physical activities or sports. °¨ Resume sexual activity. °· Do not drive home if you are discharged the same day as the procedure. Have someone else drive you. °· You may drive 24 hours after the procedure unless otherwise instructed by your health care provider. °· Do not operate machinery or power tools for 24 hours after the procedure or as directed by your health care provider. °· If your procedure was done as an  outpatient procedure, which means that you went home the same day as your procedure, a responsible adult should be with you for the first 24 hours after you arrive home. °· Keep all follow-up visits as directed by your health care provider. This is important. °SEEK MEDICAL CARE IF: °· You have a fever. °· You have chills. °· You have increased bleeding from the catheter insertion site. Hold pressure on the site. °SEEK IMMEDIATE MEDICAL CARE IF: °· You have unusual pain at the catheter insertion site. °· You have redness, warmth, or swelling at the catheter insertion site. °· You have drainage (other than a small amount of blood on the dressing) from the catheter insertion site. °· The catheter insertion site is bleeding, and the bleeding does not stop after 30 minutes of holding steady pressure on the site. °· The area near or just beyond the catheter insertion site becomes pale, cool, tingly, or numb. °  °This information is not intended to replace advice given to you by your health care provider. Make sure you discuss any questions you have with your health care provider. °  °Document Released: 12/06/2004 Document Revised: 06/10/2014 Document Reviewed: 10/21/2012 °Elsevier Interactive Patient Education ©2016 [employer] ° °

## 2015-12-22 NOTE — Progress Notes (Addendum)
Site area: Right groin a 5 french arterial sheath was removed  Site Prior to Removal:  Level 0  Pressure Applied For 15 MINUTES    Bedrest  Beginning at 1100am  Manual:   Yes.    Patient Status During Pull:  stable  Post Pull Groin Site:  Level 0  Post Pull Instructions Given:  Yes.    Post Pull Pulses Present:  Yes.    Dressing Applied:  Yes.    Comments:  VS remain stable during sheath pull.

## 2015-12-22 NOTE — H&P (View-Only) (Signed)
Vascular and Vein Specialist of [place]  Patient name: Brenda [name] MRN: [id number] DOB: 1951/04/23 Sex: female  REASON FOR VISIT: Follow-up of right lower extremity arterial insufficiency  HPI: Brenda [name] is a 65 y.o. female here today for discussion of recent diagnostic arteriogram at [place] on 12/01/2015. She has a long history dating back to the late 1990s of lower extremity arterial insufficiency in bypass. She underwent arteriogram which showed high-grade right external iliac artery stenosis. She had wide patency of her left femoral to below-knee popliteal bypass. She had patency of her prior right femoral to above-knee popliteal bypass. She had subsequently undergone a revision with a right above-knee to below-knee popliteal bypass with small saphenous vein. This jump has occluded. She continues to have discomfort and rest pain in her right foot. She has a stable ulceration over the lateral aspect of her right calf which is healing.  Past Medical History  Diagnosis Date  . CAD S/P percutaneous coronary angioplasty 1999; 2001;2003, 10/2014    a) BMS- mCx; b) re-do PCI for prog of Dz (NIR BMS 3.5 x 12); c) staged PCI: p-mRCA Express II BMS 3.0x24 & 3.5 x 16 --> p-mLAD Cyper DES 3.0 x 13 & PTCA of D2 (2.5 balloon); 10/2015 PCI p-m LAD Promus DES 2.75 x 38 (covers pre&post-stent ISR), POBA of ostial rPDA  . Myocardial infarction [facility]) 1999, 2003  . PAD (peripheral artery disease) ([name])     status post bilateral femoropopliteal bypass grafting --> Dr. Sherren Mocha Tiffiney Sparrow; -  02/2013 Revision of R Fem-AKPop to BK Pop.  . Hypercholesteremia   . Type II diabetes mellitus ([place])   . Transient ischemic attack (TIA) February 2013  . Stroke [facility])     left side weakness remains (11/26/2012)  . Carotid artery occlusion   . History of DVT (deep vein thrombosis)   . COPD (chronic obstructive pulmonary disease) with emphysema ([place])   . Chronic bronchitis ([place])    . Pneumonia   . Depression   . Hx of radiation therapy   . History of blood transfusion   . GERD (gastroesophageal reflux disease)   . H/O hiatal hernia   . Arthritis     "hands" (11/26/2012)  . Cancer of base of tongue ([place]) 2010    "& lymph nodes @ right neck" (11/26/2012  . Hx of radiation therapy 01/16/09 - 03/06/09    base of tongue, right neck node  . Patent foramen ovale February 2013    Small. Small PFO noted on TTE for TIA/CVA R-L + Bubble study with valsalva    Family History  Problem Relation Age of Onset  . Cancer Mother     pancreatic  . Diabetes Mother   . Hyperlipidemia Mother   . Hypertension Mother   . Other Mother     varicose vein  . Cancer Father 72    throat  . Heart disease Father   . Heart attack Father   . Cancer Sister     breast  . Diabetes Sister   . Deep vein thrombosis Brother   . Diabetes Brother   . Hearing loss Brother   . Hyperlipidemia Brother   . Hypertension Brother   . Heart attack Brother   . Clotting disorder Brother   . Diabetes Son   . Hyperlipidemia Son     SOCIAL HISTORY: Social History  Substance Use Topics  . Smoking status: Current Every Day Smoker -- 0.50 packs/day for 50 years  Types: Cigarettes  . Smokeless tobacco: Never Used     Comment: down to 8 cigarettes daily 11/13/15  . Alcohol Use: 3.0 oz/week    5 Glasses of wine per week    Allergies  Allergen Reactions  . Tape Hives and Other (See Comments)    USE PAPER TAPE ONLY- Adhesive peels off skin-makes pt. Raw.  . Isosorbide Other (See Comments)    Drops BP too low  . Nicoderm [Nicotine] Rash    To the PATCH only, breakouts on skin    Current Outpatient Prescriptions  Medication Sig Dispense Refill  . acetaminophen (TYLENOL) 500 MG tablet Take 1,000 mg by mouth 2 (two) times daily as needed for moderate pain or headache.     . albuterol (PROVENTIL HFA;VENTOLIN HFA) 108 (90 BASE) MCG/ACT inhaler Inhale 2 puffs into the lungs every 6 (six) hours as  needed for wheezing or shortness of breath. 1 Inhaler 0  . aspirin EC 81 MG tablet Take 1 tablet (81 mg total) by mouth daily. 30 tablet 10  . buPROPion (WELLBUTRIN XL) 300 MG 24 hr tablet Take 300 mg by mouth daily.     . clopidogrel (PLAVIX) 75 MG tablet Take 1 tablet (75 mg total) by mouth daily. 30 tablet 12  . docusate sodium (COLACE) 100 MG capsule Take 100 mg by mouth daily.    . fish oil-omega-3 fatty acids 1000 MG capsule Take 1 g by mouth 2 (two) times daily.     . fluticasone (FLONASE) 50 MCG/ACT nasal spray Place 2 sprays into both nostrils daily. 16 g 2  . fluticasone furoate-vilanterol (BREO ELLIPTA) 100-25 MCG/INH AEPB Inhale 1 puff into the lungs daily. 60 each 5  . guaiFENesin (MUCINEX) 600 MG 12 hr tablet Take 2 tablets (1,200 mg total) by mouth 2 (two) times daily. 60 tablet 3  . insulin lispro (HUMALOG) 100 UNIT/ML injection Inject 3 Units into the skin 3 (three) times daily with meals as needed for high blood sugar.     . ipratropium-albuterol (DUONEB) 0.5-2.5 (3) MG/3ML SOLN Take 3 mLs by nebulization every 6 (six) hours. (Patient taking differently: Take 3 mLs by nebulization 2 (two) times daily as needed (for wheezingt or shortness of breath). ) 360 mL 3  . loratadine (CLARITIN) 10 MG tablet Take 1 tablet (10 mg total) by mouth daily. 30 tablet 0  . Multiple Vitamins-Minerals (MULTIVITAMIN & MINERAL PO) Take 1 tablet by mouth daily.    Marland Kitchen neomycin-bacitracin-polymyxin (NEOSPORIN) ointment Apply 1 application topically every 12 (twelve) hours. For open wound    . nitroGLYCERIN (NITROLINGUAL) 0.4 MG/SPRAY spray Place 1 spray under the tongue every 5 (five) minutes as needed for chest pain. 12 g 0  . omeprazole (PRILOSEC) 20 MG capsule Take 20 mg by mouth daily.     . rosuvastatin (CRESTOR) 20 MG tablet Take 20 mg by mouth daily.    Marland Kitchen SPIRIVA RESPIMAT 2.5 MCG/ACT AERS 2 puffs daily.     No current facility-administered medications for this visit.    Review of systems  unchanged from her recent office visit   PHYSICAL EXAM: Filed Vitals:   12/19/15 1146  BP: 121/65  Pulse: 82  Temp: 98.2 F (36.8 C)  TempSrc: Oral  Resp: 18  Height: '5\' 6"'$  (1.676 m)  Weight: 123 lb (55.792 kg)  SpO2: 96%    GENERAL: The patient is a well-nourished female, in no acute distress. The vital signs are documented above.  VASCULAR: Palpable but diminished right femoral pulse. 2-3+  left femoral pulse PULMONARY: There is good air exchange bilaterally without wheezing or rales. ABDOMEN: Soft and non-tender  MUSCULOSKELETAL: There are no major deformities or cyanosis. NEUROLOGIC: No focal weakness or paresthesias are detected. SKIN: 2-3 cm open ulceration in her right lateral calf with a clean base PSYCHIATRIC: The patient has a normal affect.  DATA:  I reviewed her arteriogram in detail with the patient. This shows the above findings  MEDICAL ISSUES: Continued rest pain and slow healing of her right lateral wound. Explained that this most likely is related to a combination of inflow disease and occlusion of her popliteal artery below her above-knee femoropopliteal bypass. She does have good tibial runoff. I have recommended attempt at crossing her iliac stenosis. Feel that this would be the most likely chance for improving inflow and resolving her rest pain allowing her to heal her calf wound. Would obviously need better inflow before she would have any future consideration of revision of her right femoral-popliteal bypass. She understands the possibility of inability to cross this lesion but I feel that it is reasonable to attempt this as our next intervention.    Rosetta Posner, MD FACS [facility] Vein Specialists of [facility] Tel [phone] Pager [phone]

## 2015-12-22 NOTE — Interval H&P Note (Signed)
History and Physical Interval Note:  12/22/2015 7:40 AM  Brenda [name]  has presented today for surgery, with the diagnosis of pvd w/ right leg ulcer  The various methods of treatment have been discussed with the patient and family. After consideration of risks, benefits and other options for treatment, the patient has consented to  Procedure(s): Abdominal Aortogram (N/A) as a surgical intervention .  The patient's history has been reviewed, patient examined, no change in status, stable for surgery.  I have reviewed the patient's chart and labs.  Questions were answered to the patient's satisfaction.     Curt Jews

## 2015-12-22 NOTE — Progress Notes (Addendum)
Site area:Left groin a 5 french long venous sheath was removed  Site Prior to Removal:  Level 0  Pressure Applied For 20 MINUTES    Bedrest Beginning at 1100a  Manual:   Yes.    Patient Status During Pull:  stable  Post Pull Groin Site:  Level 0  Post Pull Instructions Given:  Yes.    Post Pull Pulses Present:  Yes.    Dressing Applied:  Yes.    Comments:  VS remain stable during sheath pull

## 2015-12-22 NOTE — Op Note (Signed)
    OPERATIVE REPORT  DATE OF SURGERY: 12/22/2015  PATIENT: Brenda [name], 65 y.o. female MRN: [id number]  DOB: Apr 21, 1951  PRE-OPERATIVE DIAGNOSIS: Rest pain right foot with known right external iliac stenosis  POST-OPERATIVE DIAGNOSIS:  Same  PROCEDURE: Aortogram and iliac arteriogram. Right external iliac angioplasty and stent placement  SURGEON:  Curt Jews, M.D.  ANESTHESIA:  Local with sedation  PLAN OF CARE: Holding area stable   PATIENT DISPOSITION:  PACU - hemodynamically stable  PROCEDURE DETAILS: Patient was taken to the perfect catheter placed supine position where the area both groins prepped draped in usual sterile fashion. Patient had a recent arteriogram revealing high-grade stenosis of the external iliac artery on the right just below the internal iliac artery takeoff. Patient had extreme calcification of the aortoiliac segments. She returned today for attempted crossing of this lesion. Using local anesthesia and SonoSite visualization the right common femoral artery was entered and a 5 French sheath was placed. A entered the wire would not cross the lesion. A angled Glidewire was used to attempt to cross the lesion. Hand injections revealed high-grade stenosis is seen before in the proximal external iliac artery. A guiding catheter was also used with the angled Glidewire to attempt across this. The Glidewire did pass proximally but was felt this most likely with some intimal. Hand injection revealed this was subintimal. Further attempts continued to go into this false lumen. The wire was removed from the right and the 5 French sheath was left in place. Next using local anesthesia and SonoSite visualization the left common femoral artery was easily entered with an 18-gauge needle a guidewire was passed up into the aorta. A crossover catheter was used to access the right common iliac artery via the left groin approach. Again the guidewire would not past cross the tight lesion.  Hand injections again revealed takeoff and distal to this. The guidewire was able to cross into the internal iliac artery and the right. Using this for better purchase a crossover catheter was used to cross the aorta after removing the standard crossover catheter. The critical with cross catheter was passed down to the level of the more distal common iliac artery. The Glidewire again was used and I was able to get across the stenosis in the external iliac and the guidewire was passed further down. The crit cross was allowed to be pushed past this area. The glide catheter was exchanged for a Rosen wire. A 6 French crossover catheter was then passed over the [place] wire after removing the 5 [place] sheath. The tight stenosis was predilated with a 5 mm x 40 mm balloon. A 6 mm x 27 mm stent was chosen and this was positioned with the proximal portion just in the common iliac artery extending across the origin of the internal iliac artery and down onto the external iliac pass the level of stenosis. This was deployed to 10 atm with excellent expansion. Final injection revealed excellent positioning of the stent with no residual stenosis. The 6 French sheath was withdrawn back into the left iliac artery. The patient was given 4000 units of intravenous heparin prior to placement of the occluding catheter. She was transferred to the holding area and the sheaths will be removed following normalization of her ACT.   Curt Jews, M.D. 12/22/2015 9:11 AM

## 2015-12-25 ENCOUNTER — Telehealth: Payer: Self-pay | Admitting: Vascular Surgery

## 2015-12-25 MED FILL — Clopidogrel Bisulfate Tab 75 MG (Base Equiv): ORAL | Qty: 1 | Status: AC

## 2015-12-25 NOTE — Telephone Encounter (Signed)
Sched lab 8/11 at 1:00 and MD 8/15 at 9:00. Spoke to pt to inform them of appts.

## 2015-12-25 NOTE — Telephone Encounter (Signed)
-----   Message from Mena Goes, RN sent at 12/22/2015 11:44 AM EDT ----- Regarding: schedule   ----- Message -----    From: Rosetta Posner, MD    Sent: 12/22/2015   9:17 AM      To: Vvs Charge Pool  Aortogram and iliac arteriogram. Did not have runoff. Had access of right and left groin with SonoSite visualization bilaterally. Had second-order catheterization from the left groin across the stenosis in the right external iliac artery. Had a stenting of the right external iliac stenosis. I need to see her in 3 weeks with ABI

## 2016-01-11 ENCOUNTER — Encounter: Payer: Self-pay | Admitting: Vascular Surgery

## 2016-01-12 ENCOUNTER — Ambulatory Visit (HOSPITAL_COMMUNITY)
Admission: RE | Admit: 2016-01-12 | Discharge: 2016-01-12 | Disposition: A | Discharge status: Home / Self Care | Payer: Medicare Other | Source: Ambulatory Visit | Attending: Vascular Surgery | Admitting: Vascular Surgery

## 2016-01-12 DIAGNOSIS — Z48812 Encounter for surgical aftercare following surgery on the circulatory system: Secondary | ICD-10-CM | POA: Diagnosis not present

## 2016-01-12 DIAGNOSIS — E7800 Pure hypercholesterolemia, unspecified: Secondary | ICD-10-CM | POA: Diagnosis not present

## 2016-01-12 DIAGNOSIS — R938 Abnormal findings on diagnostic imaging of other specified body structures: Secondary | ICD-10-CM | POA: Insufficient documentation

## 2016-01-12 DIAGNOSIS — I739 Peripheral vascular disease, unspecified: Secondary | ICD-10-CM | POA: Diagnosis not present

## 2016-01-12 DIAGNOSIS — E119 Type 2 diabetes mellitus without complications: Secondary | ICD-10-CM | POA: Insufficient documentation

## 2016-01-12 DIAGNOSIS — F329 Major depressive disorder, single episode, unspecified: Secondary | ICD-10-CM | POA: Insufficient documentation

## 2016-01-12 DIAGNOSIS — J449 Chronic obstructive pulmonary disease, unspecified: Secondary | ICD-10-CM | POA: Insufficient documentation

## 2016-01-12 DIAGNOSIS — R0989 Other specified symptoms and signs involving the circulatory and respiratory systems: Secondary | ICD-10-CM | POA: Diagnosis present

## 2016-01-12 DIAGNOSIS — K219 Gastro-esophageal reflux disease without esophagitis: Secondary | ICD-10-CM | POA: Diagnosis not present

## 2016-01-16 ENCOUNTER — Ambulatory Visit (INDEPENDENT_AMBULATORY_CARE_PROVIDER_SITE_OTHER): Payer: Medicare Other | Admitting: Vascular Surgery

## 2016-01-16 ENCOUNTER — Encounter: Payer: Self-pay | Admitting: Vascular Surgery

## 2016-01-16 ENCOUNTER — Ambulatory Visit: Payer: Medicare Other | Admitting: Vascular Surgery

## 2016-01-16 VITALS — BP 115/64 | HR 80 | Temp 97.8°F | Resp 18 | Ht 66.0 in | Wt 126.4 lb

## 2016-01-16 DIAGNOSIS — I70432 Atherosclerosis of autologous vein bypass graft(s) of the right leg with ulceration of calf: Secondary | ICD-10-CM | POA: Diagnosis not present

## 2016-01-16 NOTE — Progress Notes (Signed)
[facility] [place]  Patient name: Brenda [name] MRN: [id number] DOB: [birth year] Sex: female  REASON FOR VISIT: Follow-up  HPI: Brenda [name] is a 65 y.o. female who presents for follow-up status post right external iliac angioplasty and stenting on 12/22/2015. This was done for right foot rest pain and ulceration to right lateral calf. She has a prior history of right femoral to above-knee popliteal bypass with subsequent revision including a right above-knee to below-knee popliteal bypass with small saphenous vein. This has occluded. He also previously had a left femoral to below-knee popliteal bypass.  Today, she reports resolution of her right foot rest pain. She continues to have pain over her right calf ulcer. He has been using peroxide and Neosporin on her wounds. She states that the wound is about the same. Her walking distance has also improved. He denies any issues with her left leg.  She continues to smoke. Has cut down to half a pack a day. She is on Plavix and a statin.  Past Medical History:  Diagnosis Date  . Arthritis    "hands" (11/26/2012)  . CAD S/P percutaneous coronary angioplasty 1999; 2001;2003, 10/2014   a) BMS- mCx; b) re-do PCI for prog of Dz (NIR BMS 3.5 x 12); c) staged PCI: p-mRCA Express II BMS 3.0x24 & 3.5 x 16 --> p-mLAD Cyper DES 3.0 x 13 & PTCA of D2 (2.5 balloon); 10/2015 PCI p-m LAD Promus DES 2.75 x 38 (covers pre&post-stent ISR), POBA of ostial rPDA  . 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 Carotid artery occlusion   . Chronic bronchitis ([place])   . COPD (chronic obstructive pulmonary disease) with emphysema ([place])   . Depression   . GERD (gastroesophageal reflux disease)   . H/O hiatal hernia   . History of blood transfusion   . History of DVT (deep vein thrombosis)   . Hx of radiation therapy   . Hx of radiation therapy 01/16/09 - 03/06/09   base of tongue, right neck node  .  Hypercholesteremia   . Myocardial infarction [facility]) 1999, 2003  . PAD (peripheral artery disease) ([name])    status post bilateral femoropopliteal bypass grafting --> Dr. Sherren Mocha Early; -  02/2013 Revision of R Fem-AKPop to BK Pop.  . Patent foramen ovale February 2013   Small. Small PFO noted on TTE for TIA/CVA R-L + Bubble study with valsalva  . Pneumonia   . Stroke [facility])    left side weakness remains (11/26/2012)  . Transient ischemic attack (TIA) February 2013  . Type II diabetes mellitus ([name])     Family History  Problem Relation Age of Onset  . Cancer Mother     pancreatic  . Diabetes Mother   . Hyperlipidemia Mother   . Hypertension Mother   . Other Mother     varicose vein  . Cancer Father 72    throat  . Heart disease Father   . Heart attack Father   . Cancer Sister     breast  . Diabetes Sister   . Deep vein thrombosis Brother   . Diabetes Brother   . Hearing loss Brother   . Hyperlipidemia Brother   . Hypertension Brother   . Heart attack Brother   . Clotting disorder Brother   . Diabetes Son   . Hyperlipidemia Son     SOCIAL HISTORY: Social History  Substance Use Topics  . Smoking  status: Current Every Day Smoker    Packs/day: 0.50    Years: 50.00    Types: Cigarettes  . Smokeless tobacco: Never Used     Comment: down to 8 cigarettes daily 11/13/15  . Alcohol use 3.0 oz/week    5 Glasses of wine per week    Allergies  Allergen Reactions  . Tape Hives and Other (See Comments)    USE PAPER TAPE ONLY- Adhesive peels off skin-makes pt. Raw.  . Isosorbide Other (See Comments)    Drops BP too low  . Nicoderm [Nicotine] Rash    To the PATCH only, breakouts on skin    Current Outpatient Prescriptions  Medication Sig Dispense Refill  . acetaminophen (TYLENOL) 500 MG tablet Take 1,000 mg by mouth 2 (two) times daily as needed for moderate pain or headache.     . albuterol (PROVENTIL HFA;VENTOLIN HFA) 108 (90 BASE) MCG/ACT inhaler Inhale 2 puffs into the  lungs every 6 (six) hours as needed for wheezing or shortness of breath. 1 Inhaler 0  . aspirin EC 81 MG tablet Take 1 tablet (81 mg total) by mouth daily. 30 tablet 10  . buPROPion (WELLBUTRIN XL) 300 MG 24 hr tablet Take 300 mg by mouth daily.     . clopidogrel (PLAVIX) 75 MG tablet Take 1 tablet (75 mg total) by mouth daily. 30 tablet 12  . docusate sodium (COLACE) 100 MG capsule Take 100 mg by mouth daily.    . fish oil-omega-3 fatty acids 1000 MG capsule Take 1 g by mouth 2 (two) times daily.     . fluticasone (FLONASE) 50 MCG/ACT nasal spray Place 2 sprays into both nostrils daily. 16 g 2  . fluticasone furoate-vilanterol (BREO ELLIPTA) 100-25 MCG/INH AEPB Inhale 1 puff into the lungs daily. 60 each 5  . guaiFENesin (MUCINEX) 600 MG 12 hr tablet Take 2 tablets (1,200 mg total) by mouth 2 (two) times daily. 60 tablet 3  . insulin lispro (HUMALOG) 100 UNIT/ML injection Inject 3 Units into the skin 3 (three) times daily with meals as needed for high blood sugar.     . ipratropium-albuterol (DUONEB) 0.5-2.5 (3) MG/3ML SOLN Take 3 mLs by nebulization every 6 (six) hours. (Patient taking differently: Take 3 mLs by nebulization 2 (two) times daily as needed (for wheezingt or shortness of breath). ) 360 mL 3  . loratadine (CLARITIN) 10 MG tablet Take 1 tablet (10 mg total) by mouth daily. 30 tablet 0  . Multiple Vitamins-Minerals (MULTIVITAMIN & MINERAL PO) Take 1 tablet by mouth daily.    Marland Kitchen neomycin-bacitracin-polymyxin (NEOSPORIN) ointment Apply 1 application topically every 12 (twelve) hours. For open wound    . nitroGLYCERIN (NITROLINGUAL) 0.4 MG/SPRAY spray Place 1 spray under the tongue every 5 (five) minutes as needed for chest pain. 12 g 0  . omeprazole (PRILOSEC) 20 MG capsule Take 20 mg by mouth daily.     . rosuvastatin (CRESTOR) 20 MG tablet Take 20 mg by mouth daily.    Marland Kitchen SPIRIVA RESPIMAT 2.5 MCG/ACT AERS 2 puffs daily.     No current facility-administered medications for this visit.       REVIEW OF SYSTEMS:  '[X]'$  denotes positive finding, '[ ]'$  denotes negative finding Cardiac  Comments:  Chest pain or chest pressure:    Shortness of breath upon exertion:    Short of breath when lying flat: x   Irregular heart rhythm:        Vascular    Pain in calf, thigh, or  hip brought on by ambulation:    Pain in feet at night that wakes you up from your sleep:     Blood clot in your veins:    Leg swelling:  x       Pulmonary    Oxygen at home: x   Productive cough:  x   Wheezing:         Neurologic    Sudden weakness in arms or legs:     Sudden numbness in arms or legs:     Sudden onset of difficulty speaking or slurred speech:    Temporary loss of vision in one eye:     Problems with dizziness:  x       Gastrointestinal    Blood in stool:     Vomited blood:         Genitourinary    Burning when urinating:     Blood in urine:        Psychiatric    Major depression:         Hematologic    Bleeding problems:    Problems with blood clotting too easily:        Skin    Rashes or ulcers: x       Constitutional    Fever or chills:      PHYSICAL EXAM: Vitals:   01/16/16 1640  BP: 115/64  Pulse: 80  Resp: 18  Temp: 97.8 F (36.6 C)  TempSrc: Oral  Weight: 126 lb 6.4 oz (57.3 kg)  Height: '5\' 6"'$  (1.676 m)    GENERAL: The patient is a well-nourished female, in no acute distress. The vital signs are documented above. CARDIAC: There is a regular rate and rhythm. No carotid bruits. VASCULAR: 2+ femoral pulses bilaterally. 2+ left popliteal pulse. Nonpalpable right popliteal pulse. Nonpalpable pedal pulses bilaterally. PULMONARY: There is good air exchange bilaterally without wheezing or rales. ABDOMEN: Soft and non-tender. No pulsatile mass. MUSCULOSKELETAL: There are no major deformities or cyanosis. NEUROLOGIC: No focal weakness or paresthesias are detected. SKIN: 1.5 cm x 1 cm full thickness ulceration to right lateral calf. Wound is clean with fibrinous  exudate at the base. No drainage seen PSYCHIATRIC: The patient has a normal affect.  DATA:  ABIs 01/12/2016  Right: 0.77 with monophasic waveforms. TBI 0.45. Left: 1.22 with triphasic waveforms. TBI 0.67.  Previously on 11/03/2015: Right 0.52, left 1.07  MEDICAL ISSUES: Peripheral arterial disease with right leg ulceration Status post right external iliac angioplasty and stenting 12/22/2015  The patient's rest pain has resolved. Her ABIs today have improved on the right. She continues to have a right lateral calf ulcer.Expect that her right calf ulcer will continue to heal with improved inflow. We will see her back in 3 months with repeat ABIs. If her wound does not heal, may consider revision of her right leg bypass. The patient was also referred to a wound center per her request.    Virgina Jock, PA-C [facility]     I have examined the patient, reviewed and agree with above.  Curt Jews, MD 01/16/2016 5:20 PM

## 2016-01-17 ENCOUNTER — Other Ambulatory Visit: Payer: Self-pay | Admitting: Vascular Surgery

## 2016-01-17 DIAGNOSIS — S81801D Unspecified open wound, right lower leg, subsequent encounter: Secondary | ICD-10-CM

## 2016-01-17 DIAGNOSIS — I739 Peripheral vascular disease, unspecified: Secondary | ICD-10-CM

## 2016-01-22 ENCOUNTER — Telehealth: Payer: Self-pay | Admitting: Vascular Surgery

## 2016-01-22 NOTE — Telephone Encounter (Signed)
I left a voice message for the patient regarding her appt at [facility] wound care on 02/13/16 at 12:30pm.

## 2016-01-26 ENCOUNTER — Encounter: Payer: Self-pay | Admitting: Family

## 2016-01-30 ENCOUNTER — Encounter (HOSPITAL_COMMUNITY): Payer: Self-pay

## 2016-01-30 ENCOUNTER — Other Ambulatory Visit (HOSPITAL_COMMUNITY): Payer: Self-pay

## 2016-01-30 ENCOUNTER — Ambulatory Visit: Payer: Self-pay | Admitting: Family

## 2016-01-30 ENCOUNTER — Ambulatory Visit (HOSPITAL_COMMUNITY)
Admission: RE | Admit: 2016-01-30 | Discharge: 2016-01-30 | Disposition: A | Discharge status: Home / Self Care | Payer: Medicare Other | Source: Ambulatory Visit | Attending: Family | Admitting: Family

## 2016-01-30 ENCOUNTER — Encounter: Payer: Self-pay | Admitting: Family

## 2016-01-30 ENCOUNTER — Ambulatory Visit (INDEPENDENT_AMBULATORY_CARE_PROVIDER_SITE_OTHER)
Admission: RE | Admit: 2016-01-30 | Discharge: 2016-01-30 | Disposition: A | Discharge status: Home / Self Care | Payer: Medicare Other | Source: Ambulatory Visit | Attending: Family | Admitting: Family

## 2016-01-30 ENCOUNTER — Ambulatory Visit (INDEPENDENT_AMBULATORY_CARE_PROVIDER_SITE_OTHER): Payer: Medicare Other | Admitting: Family

## 2016-01-30 VITALS — BP 161/81 | HR 84 | Temp 97.5°F | Resp 18 | Ht 66.0 in | Wt 125.7 lb

## 2016-01-30 DIAGNOSIS — R0989 Other specified symptoms and signs involving the circulatory and respiratory systems: Secondary | ICD-10-CM

## 2016-01-30 DIAGNOSIS — E1151 Type 2 diabetes mellitus with diabetic peripheral angiopathy without gangrene: Secondary | ICD-10-CM

## 2016-01-30 DIAGNOSIS — Z95828 Presence of other vascular implants and grafts: Secondary | ICD-10-CM

## 2016-01-30 DIAGNOSIS — I70432 Atherosclerosis of autologous vein bypass graft(s) of the right leg with ulceration of calf: Secondary | ICD-10-CM | POA: Diagnosis not present

## 2016-01-30 DIAGNOSIS — Z720 Tobacco use: Secondary | ICD-10-CM

## 2016-01-30 DIAGNOSIS — I70219 Atherosclerosis of native arteries of extremities with intermittent claudication, unspecified extremity: Secondary | ICD-10-CM

## 2016-01-30 DIAGNOSIS — I739 Peripheral vascular disease, unspecified: Secondary | ICD-10-CM | POA: Diagnosis not present

## 2016-01-30 DIAGNOSIS — F17200 Nicotine dependence, unspecified, uncomplicated: Secondary | ICD-10-CM

## 2016-01-30 DIAGNOSIS — I6523 Occlusion and stenosis of bilateral carotid arteries: Secondary | ICD-10-CM

## 2016-01-30 LAB — VAS US CAROTID
LCCAPDIAS: 26 cm/s
LCCAPSYS: 95 cm/s
LICAPSYS: 133 cm/s
Left CCA dist dias: 29 cm/s
Left CCA dist sys: 90 cm/s
Left ICA dist dias: -32 cm/s
Left ICA dist sys: -96 cm/s
Left ICA prox dias: 42 cm/s
RCCADSYS: -69 cm/s
RCCAPDIAS: 14 cm/s
RCCAPSYS: 87 cm/s
RIGHT CCA MID DIAS: -17 cm/s
RIGHT ECA DIAS: -6 cm/s

## 2016-01-30 NOTE — Patient Instructions (Signed)
Stroke Prevention Some medical conditions and behaviors are associated with an increased chance of having a stroke. You may prevent a stroke by making healthy choices and managing medical conditions. HOW CAN I REDUCE MY RISK OF HAVING A STROKE?   Stay physically active. Get at least 30 minutes of activity on most or all days.  Do not smoke. It may also be helpful to avoid exposure to secondhand smoke.  Limit alcohol use. Moderate alcohol use is considered to be:  No more than 2 drinks per day for men.  No more than 1 drink per day for nonpregnant women.  Eat healthy foods. This involves:  Eating 5 or more servings of fruits and vegetables a day.  Making dietary changes that address high blood pressure (hypertension), high cholesterol, diabetes, or obesity.  Manage your cholesterol levels.  Making food choices that are high in fiber and low in saturated fat, trans fat, and cholesterol may control cholesterol levels.  Take any prescribed medicines to control cholesterol as directed by your health care provider.  Manage your diabetes.  Controlling your carbohydrate and sugar intake is recommended to manage diabetes.  Take any prescribed medicines to control diabetes as directed by your health care provider.  Control your hypertension.  Making food choices that are low in salt (sodium), saturated fat, trans fat, and cholesterol is recommended to manage hypertension.  Ask your health care provider if you need treatment to lower your blood pressure. Take any prescribed medicines to control hypertension as directed by your health care provider.  If you are 18-39 years of age, have your blood pressure checked every 3-5 years. If you are 40 years of age or older, have your blood pressure checked every year.  Maintain a healthy weight.  Reducing calorie intake and making food choices that are low in sodium, saturated fat, trans fat, and cholesterol are recommended to manage  weight.  Stop drug abuse.  Avoid taking birth control pills.  Talk to your health care provider about the risks of taking birth control pills if you are over 35 years old, smoke, get migraines, or have ever had a blood clot.  Get evaluated for sleep disorders (sleep apnea).  Talk to your health care provider about getting a sleep evaluation if you snore a lot or have excessive sleepiness.  Take medicines only as directed by your health care provider.  For some people, aspirin or blood thinners (anticoagulants) are helpful in reducing the risk of forming abnormal blood clots that can lead to stroke. If you have the irregular heart rhythm of atrial fibrillation, you should be on a blood thinner unless there is a good reason you cannot take them.  Understand all your medicine instructions.  Make sure that other conditions (such as anemia or atherosclerosis) are addressed. SEEK IMMEDIATE MEDICAL CARE IF:   You have sudden weakness or numbness of the face, arm, or leg, especially on one side of the body.  Your face or eyelid droops to one side.  You have sudden confusion.  You have trouble speaking (aphasia) or understanding.  You have sudden trouble seeing in one or both eyes.  You have sudden trouble walking.  You have dizziness.  You have a loss of balance or coordination.  You have a sudden, severe headache with no known cause.  You have new chest pain or an irregular heartbeat. Any of these symptoms may represent a serious problem that is an emergency. Do not wait to see if the symptoms will   go away. Get medical help at once. Call your local emergency services (911 in [place] Do not drive yourself to the hospital.   This information is not intended to replace advice given to you by your health care provider. Make sure you discuss any questions you have with your health care provider.   Document Released: 06/27/2004 Document Revised: 06/10/2014 Document Reviewed:  11/20/2012 Elsevier Interactive Patient Education 2016 Else[employer] Inc[employer]    Peripheral Vascular Disease Peripheral vascular disease (PVD) is a disease of the blood vessels that are not part of your heart and brain. A simple term for PVD is poor circulation. In most cases, PVD narrows the blood vessels that carry blood from your heart to the rest of your body. This can result in a decreased supply of blood to your arms, legs, and internal organs, like your stomach or kidneys. However, it most often affects a person's lower legs and feet. There are two types of PVD.  Organic PVD. This is the more common type. It is caused by damage to the structure of blood vessels.  Functional PVD. This is caused by conditions that make blood vessels contract and tighten (spasm). Without treatment, PVD tends to get worse over time. PVD can also lead to acute ischemic limb. This is when an arm or limb suddenly has trouble getting enough blood. This is a medical emergency. CAUSES Each type of PVD has many different causes. The most common cause of PVD is buildup of a fatty material (plaque) inside of your arteries (atherosclerosis). Small amounts of plaque can break off from the walls of the blood vessels and become lodged in a smaller artery. This blocks blood flow and can cause acute ischemic limb. Other common causes of PVD include:  Blood clots that form inside of blood vessels.  Injuries to blood vessels.  Diseases that cause inflammation of blood vessels or cause blood vessel spasms.  Health behaviors and health history that increase your risk of developing PVD. RISK FACTORS  You may have a greater risk of PVD if you:  Have a family history of PVD.  Have certain medical conditions, including:  High cholesterol.  Diabetes.  High blood pressure (hypertension).  Coronary heart disease.  Past problems with blood clots.  Past injury, such as burns or a broken bone. These may have damaged blood  vessels in your limbs.  Buerger disease. This is caused by inflamed blood vessels in your hands and feet.  Some forms of arthritis.  Rare birth defects that affect the arteries in your legs.  Use tobacco.  Do not get enough exercise.  Are obese.  Are age 50 or older. SIGNS AND SYMPTOMS  PVD may cause many different symptoms. Your symptoms depend on what part of your body is not getting enough blood. Some common signs and symptoms include:  Cramps in your lower legs. This may be a symptom of poor leg circulation (claudication).  Pain and weakness in your legs while you are physically active that goes away when you rest (intermittent claudication).  Leg pain when at rest.  Leg numbness, tingling, or weakness.  Coldness in a leg or foot, especially when compared with the other leg.  Skin or hair changes. These can include:  Hair loss.  Shiny skin.  Pale or bluish skin.  Thick toenails.  Inability to get or maintain an erection (erectile dysfunction). People with PVD are more prone to developing ulcers and sores on their toes, feet, or legs. These may take longer than   normal to heal. DIAGNOSIS Your health care provider may diagnose PVD from your signs and symptoms. The health care provider will also do a physical exam. You may have tests to find out what is causing your PVD and determine its severity. Tests may include:  Blood pressure recordings from your arms and legs and measurements of the strength of your pulses (pulse volume recordings).  Imaging studies using sound waves to take pictures of the blood flow through your blood vessels (Doppler ultrasound).  Injecting a dye into your blood vessels before having imaging studies using:  X-rays (angiogram or arteriogram).  Computer-generated X-rays (CT angiogram).  A powerful electromagnetic field and a computer (magnetic resonance angiogram or MRA). TREATMENT Treatment for PVD depends on the cause of your condition  and the severity of your symptoms. It also depends on your age. Underlying causes need to be treated and controlled. These include long-lasting (chronic) conditions, such as diabetes, high cholesterol, and high blood pressure. You may need to first try making lifestyle changes and taking medicines. Surgery may be needed if these do not work. Lifestyle changes may include:  Quitting smoking.  Exercising regularly.  Following a low-fat, low-cholesterol diet. Medicines may include:  Blood thinners to prevent blood clots.  Medicines to improve blood flow.  Medicines to improve your blood cholesterol levels. Surgical procedures may include:  A procedure that uses an inflated balloon to open a blocked artery and improve blood flow (angioplasty).  A procedure to put in a tube (stent) to keep a blocked artery open (stent implant).  Surgery to reroute blood flow around a blocked artery (peripheral bypass surgery).  Surgery to remove dead tissue from an infected wound on the affected limb.  Amputation. This is surgical removal of the affected limb. This may be necessary in cases of acute ischemic limb that are not improved through medical or surgical treatments. HOME CARE INSTRUCTIONS  Take medicines only as directed by your health care provider.  Do not use any tobacco products, including cigarettes, chewing tobacco, or electronic cigarettes. If you need help quitting, ask your health care provider.  Lose weight if you are overweight, and maintain a healthy weight as directed by your health care provider.  Eat a diet that is low in fat and cholesterol. If you need help, ask your health care provider.  Exercise regularly. Ask your health care provider to suggest some good activities for you.  Use compression stockings or other mechanical devices as directed by your health care provider.  Take good care of your feet.  Wear comfortable shoes that fit well.  Check your feet often for  any cuts or sores. SEEK MEDICAL CARE IF:  You have cramps in your legs while walking.  You have leg pain when you are at rest.  You have coldness in a leg or foot.  Your skin changes.  You have erectile dysfunction.  You have cuts or sores on your feet that are not healing. SEEK IMMEDIATE MEDICAL CARE IF:  Your arm or leg turns cold and blue.  Your arms or legs become red, warm, swollen, painful, or numb.  You have chest pain or trouble breathing.  You suddenly have weakness in your face, arm, or leg.  You become very confused or lose the ability to speak.  You suddenly have a very bad headache or lose your vision.   This information is not intended to replace advice given to you by your health care provider. Make sure you discuss any questions   you have with your health care provider.   Document Released: 06/27/2004 Document Revised: 06/10/2014 Document Reviewed: 10/28/2013 Elsevier Interactive Patient Education 2016 [employer].     Steps to Quit Smoking  Smoking tobacco can be harmful to your health and can affect almost every organ in your body. Smoking puts you, and those around you, at risk for developing many serious chronic diseases. Quitting smoking is difficult, but it is one of the best things that you can do for your health. It is never too late to quit. WHAT ARE THE BENEFITS OF QUITTING SMOKING? When you quit smoking, you lower your risk of developing serious diseases and conditions, such as:  Lung cancer or lung disease, such as COPD.  Heart disease.  Stroke.  Heart attack.  Infertility.  Osteoporosis and bone fractures. Additionally, symptoms such as coughing, wheezing, and shortness of breath may get better when you quit. You may also find that you get sick less often because your body is stronger at fighting off colds and infections. If you are pregnant, quitting smoking can help to reduce your chances of having a baby of low birth weight. HOW DO  I GET READY TO QUIT? When you decide to quit smoking, create a plan to make sure that you are successful. Before you quit:  Pick a date to quit. Set a date within the next two weeks to give you time to prepare.  Write down the reasons why you are quitting. Keep this list in places where you will see it often, such as on your bathroom mirror or in your car or wallet.  Identify the people, places, things, and activities that make you want to smoke (triggers) and avoid them. Make sure to take these actions:  Throw away all cigarettes at home, at work, and in your car.  Throw away smoking accessories, such as Scientist, research (medical).  Clean your car and make sure to empty the ashtray.  Clean your home, including curtains and carpets.  Tell your family, friends, and coworkers that you are quitting. Support from your loved ones can make quitting easier.  Talk with your health care provider about your options for quitting smoking.  Find out what treatment options are covered by your health insurance. WHAT STRATEGIES CAN I USE TO QUIT SMOKING?  Talk with your healthcare provider about different strategies to quit smoking. Some strategies include:  Quitting smoking altogether instead of gradually lessening how much you smoke over a period of time. Research shows that quitting "cold [place]" is more successful than gradually quitting.  Attending in-person counseling to help you build problem-solving skills. You are more likely to have success in quitting if you attend several counseling sessions. Even short sessions of 10 minutes can be effective.  Finding resources and support systems that can help you to quit smoking and remain smoke-free after you quit. These resources are most helpful when you use them often. They can include:  Online chats with a Social worker.  Telephone quitlines.  Printed Furniture conservator/restorer.  Support groups or group counseling.  Text messaging programs.  Mobile phone  applications.  Taking medicines to help you quit smoking. (If you are pregnant or breastfeeding, talk with your health care provider first.) Some medicines contain nicotine and some do not. Both types of medicines help with cravings, but the medicines that include nicotine help to relieve withdrawal symptoms. Your health care provider may recommend:  Nicotine patches, gum, or lozenges.  Nicotine inhalers or sprays.  Non-nicotine medicine that  is taken by mouth. Talk with your health care provider about combining strategies, such as taking medicines while you are also receiving in-person counseling. Using these two strategies together makes you more likely to succeed in quitting than if you used either strategy on its own. If you are pregnant or breastfeeding, talk with your health care provider about finding counseling or other support strategies to quit smoking. Do not take medicine to help you quit smoking unless told to do so by your health care provider. WHAT THINGS CAN I DO TO MAKE IT EASIER TO QUIT? Quitting smoking might feel overwhelming at first, but there is a lot that you can do to make it easier. Take these important actions:  Reach out to your family and friends and ask that they support and encourage you during this time. Call telephone quitlines, reach out to support groups, or work with a counselor for support.  Ask people who smoke to avoid smoking around you.  Avoid places that trigger you to smoke, such as bars, parties, or smoke-break areas at work.  Spend time around people who do not smoke.  Lessen stress in your life, because stress can be a smoking trigger for some people. To lessen stress, try:  Exercising regularly.  Deep-breathing exercises.  Yoga.  Meditating.  Performing a body scan. This involves closing your eyes, scanning your body from head to toe, and noticing which parts of your body are particularly tense. Purposefully relax the muscles in those  areas.  Download or purchase mobile phone or tablet apps (applications) that can help you stick to your quit plan by providing reminders, tips, and encouragement. There are many free apps, such as QuitGuide from the [employer] Office manager for Disease Control and Prevention). You can find other support for quitting smoking (smoking cessation) through smokefree.gov and other websites. HOW WILL I FEEL WHEN I QUIT SMOKING? Within the first 24 hours of quitting smoking, you may start to feel some withdrawal symptoms. These symptoms are usually most noticeable 2-3 days after quitting, but they usually do not last beyond 2-3 weeks. Changes or symptoms that you might experience include:  Mood swings.  Restlessness, anxiety, or irritation.  Difficulty concentrating.  Dizziness.  Strong cravings for sugary foods in addition to nicotine.  Mild weight gain.  Constipation.  Nausea.  Coughing or a sore throat.  Changes in how your medicines work in your body.  A depressed mood.  Difficulty sleeping (insomnia). After the first 2-3 weeks of quitting, you may start to notice more positive results, such as:  Improved sense of smell and taste.  Decreased coughing and sore throat.  Slower heart rate.  Lower blood pressure.  Clearer skin.  The ability to breathe more easily.  Fewer sick days. Quitting smoking is very challenging for most people. Do not get discouraged if you are not successful the first time. Some people need to make many attempts to quit before they achieve long-term success. Do your best to stick to your quit plan, and talk with your health care provider if you have any questions or concerns.   This information is not intended to replace advice given to you by your health care provider. Make sure you discuss any questions you have with your health care provider.   Document Released: 05/14/2001 Document Revised: 10/04/2014 Document Reviewed: 10/04/2014 Elsevier Interactive Patient  Education [employer].

## 2016-01-30 NOTE — Progress Notes (Signed)
[facility] [place] HISTORY AND PHYSICAL   MRN : [id number]  History of Present Illness:   Brenda [name] is a 65 y.o. female patient of Dr. Donnetta Hutching who presents for follow-up status post right external iliac angioplasty and stenting on 12/22/2015. This was done for right foot rest pain and ulceration to right lateral calf. She has a prior history of right femoral to above-knee popliteal bypass in 1999 with subsequent revision including a right above-knee to below-knee popliteal bypass with small saphenous vein on 02/05/13. This has occluded. He also previously had a left femoral to below-knee popliteal bypass with translocated non-reversed saphenous vein on 06/10/1998.  She had a heart cath in May 2017 and 1 stent placed after evaluation for chest discomfort.  She was having right calf pain after walking about 8 minutes at her February 2017 visit, she reports that her claudication has resolved since the July 2017 revascularization procedure.  She has been walking less since her many URI's, and is unsteady on her feet, but has been performing daily seated leg exercises as advised. She has numbness in both feet at times, worse in the right foot. She denies claudication pain in left leg. She had a stroke in 2000 as manifested by left hemiparesis (still drags her left leg slightly and has some mild residual weakness in her left upper extremity), expressive aphasia has resolved, has had several TIA's since then with slurred speech and trouble writing with her right hand, the symptoms resolve quickly, last episode was about 2013.   07/06/12 CT soft tissue neck: Atherosclerotic calcifications are present at the carotid bifurcations bilaterally without a significant stenosis.  She had an MI in 2005, states mild.  The patient states she has had pneumonia 5x since March of 2016, and a couple of cases of self treated bronchitis. Pt states it was found that she had aspiration pneumonia  and has taken measures to prevent this. She lost weight with these URI's. 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  Pt Diabetic: Yes,  A1C in May 2017 was 8.9 (review of records), increased from previous 7.6, is yet uncontrolled. She started seeing Dr. Buddy Duty, endocrinologist, in February 2017. Pt smoker: smoker (9-12 cigarettes/day, started at age 70 yrs); she has attended smoking cessation classes 3 times, is taking Wellbutrin now which has not decreased her craving, she gets very depressed when not smoking, states she was on a higher dose previously.   Pt meds include: Statin :Yes ASA: yes Other anticoagulants/antiplatelets: Plavix  Current Outpatient Prescriptions  Medication Sig Dispense Refill  . acetaminophen (TYLENOL) 500 MG tablet Take 1,000 mg by mouth 2 (two) times daily as needed for moderate pain or headache.     . albuterol (PROVENTIL HFA;VENTOLIN HFA) 108 (90 BASE) MCG/ACT inhaler Inhale 2 puffs into the lungs every 6 (six) hours as needed for wheezing or shortness of breath. 1 Inhaler 0  . aspirin EC 81 MG tablet Take 1 tablet (81 mg total) by mouth daily. 30 tablet 10  . buPROPion (WELLBUTRIN XL) 300 MG 24 hr tablet Take 300 mg by mouth daily.     . clopidogrel (PLAVIX) 75 MG tablet Take 1 tablet (75 mg total) by mouth daily. 30 tablet 12  . docusate sodium (COLACE) 100 MG capsule Take 100 mg by mouth daily.    . fish oil-omega-3 fatty acids 1000 MG capsule Take 1 g by  mouth 2 (two) times daily.     . fluticasone (FLONASE) 50 MCG/ACT nasal spray Place 2 sprays into both nostrils daily. 16 g 2  . fluticasone furoate-vilanterol (BREO ELLIPTA) 100-25 MCG/INH AEPB Inhale 1 puff into the lungs daily. 60 each 5  . insulin glargine (LANTUS) 100 UNIT/ML injection Inject into the skin every morning.    . insulin lispro (HUMALOG) 100 UNIT/ML injection Inject 3 Units into the skin 3 (three) times  daily with meals as needed for high blood sugar.     . ipratropium-albuterol (DUONEB) 0.5-2.5 (3) MG/3ML SOLN Take 3 mLs by nebulization every 6 (six) hours. (Patient taking differently: Take 3 mLs by nebulization 2 (two) times daily as needed (for wheezingt or shortness of breath). ) 360 mL 3  . loratadine (CLARITIN) 10 MG tablet Take 1 tablet (10 mg total) by mouth daily. 30 tablet 0  . Multiple Vitamins-Minerals (MULTIVITAMIN & MINERAL PO) Take 1 tablet by mouth daily.    Marland Kitchen neomycin-bacitracin-polymyxin (NEOSPORIN) ointment Apply 1 application topically every 12 (twelve) hours. For open wound    . nitroGLYCERIN (NITROLINGUAL) 0.4 MG/SPRAY spray Place 1 spray under the tongue every 5 (five) minutes as needed for chest pain. 12 g 0  . omeprazole (PRILOSEC) 20 MG capsule Take 20 mg by mouth daily.     . rosuvastatin (CRESTOR) 20 MG tablet Take 20 mg by mouth daily.    Marland Kitchen SPIRIVA RESPIMAT 2.5 MCG/ACT AERS 2 puffs daily.     No current facility-administered medications for this visit.     Past Medical History:  Diagnosis Date  . Arthritis    "hands" (11/26/2012)  . CAD S/P percutaneous coronary angioplasty 1999; 2001;2003, 10/2014   a) BMS- mCx; b) re-do PCI for prog of Dz (NIR BMS 3.5 x 12); c) staged PCI: p-mRCA Express II BMS 3.0x24 & 3.5 x 16 --> p-mLAD Cyper DES 3.0 x 13 & PTCA of D2 (2.5 balloon); 10/2015 PCI p-m LAD Promus DES 2.75 x 38 (covers pre&post-stent ISR), POBA of ostial rPDA  . Cancer of base of tongue ([place]) 2010   "& lymph nodes @ right neck" (11/26/2012  . Carotid artery occlusion   . Chronic bronchitis ([place])   . COPD (chronic obstructive pulmonary disease) with emphysema ([place])   . Depression   . GERD (gastroesophageal reflux disease)   . H/O hiatal hernia   . History of blood transfusion   . History of DVT (deep vein thrombosis)   . Hx of radiation therapy   . Hx of radiation therapy 01/16/09 - 03/06/09   base of tongue, right neck node  . Hypercholesteremia   . Myocardial  infarction [facility]) 1999, 2003  . PAD (peripheral artery disease) ([name])    status post bilateral femoropopliteal bypass grafting --> Dr. Sherren Mocha Early; -  02/2013 Revision of R Fem-AKPop to BK Pop.  . Patent foramen ovale February 2013   Small. Small PFO noted on TTE for TIA/CVA R-L + Bubble study with valsalva  . Pneumonia   . Stroke [facility])    left side weakness remains (11/26/2012)  . Transient ischemic attack (TIA) February 2013  . Type II diabetes mellitus ([place])     Social History Social History  Substance Use Topics  . Smoking status: Current Every Day Smoker    Packs/day: 0.50    Years: 50.00    Types: Cigarettes  . Smokeless tobacco: Never Used     Comment: down to 8 cigarettes daily 11/13/15  . Alcohol use 3.0 oz/week  5 Glasses of wine per week    Family History Family History  Problem Relation Age of Onset  . Cancer Mother     pancreatic  . Diabetes Mother   . Hyperlipidemia Mother   . Hypertension Mother   . Other Mother     varicose vein  . Cancer Father 72    throat  . Heart disease Father   . Heart attack Father   . Cancer Sister     breast  . Diabetes Sister   . Deep vein thrombosis Brother   . Diabetes Brother   . Hearing loss Brother   . Hyperlipidemia Brother   . Hypertension Brother   . Heart attack Brother   . Clotting disorder Brother   . Diabetes Son   . Hyperlipidemia Son     Surgical History Past Surgical History:  Procedure Laterality Date  . ANKLE FRACTURE SURGERY Right 2007   "got a metal plate and 8 screws in it" (11/26/2012)  . APPENDECTOMY  1962  . BYPASS GRAFT POPLITEAL TO POPLITEAL Right 02/05/2013   Procedure: BYPASS GRAFT ABOVE KNEE POPLITEAL TO BELOW KNEE POPLITEAL WITH SMALL SAPHANOUS VEIN;  Surgeon: Rosetta Posner, MD;  Location: [place];  Service: Vascular;  Laterality: Right;  . CARDIAC CATHETERIZATION  1999; 10/2005   5/'07: mildLAD 20-30%, patent stent, diags- no Dz. RCA 2 BMS patent ~20% ISR.   Marland Kitchen CARDIAC CATHETERIZATION N/A  10/06/2015   Procedure: Left Heart Cath and Coronary Angiography;  Surgeon: Leonie Man, MD;  Location: [place] [facility];  Service: Cardiovascular; p-mLAD 99% pre-stent, 60% ISR & 80% post-stent, o-RPDA 90%, ~10% ISR in 2 RCA (Prox & mid) BMS,~20% ISR in pCx BMS with 5% OM2 BMS.    Marland Kitchen CARDIAC CATHETERIZATION N/A 10/06/2015   Procedure: Coronary Stent Intervention;  Surgeon: Leonie Man, MD;  Location: [place] CV [facility];  Service: Cardiovascular; p-m LAD Promus DES 2.75 x 38 (tapered post-dilatoin)  . CARDIAC CATHETERIZATION N/A 10/06/2015   Procedure: Coronary Balloon Angioplasty;  Surgeon: Leonie Man, MD;  Location: [place] [facility];  Service: Cardiovascular;  POBA of Ostial rPDA.  Marland Kitchen CARPAL TUNNEL RELEASE Right 1980's?  Marland Kitchen CATARACT EXTRACTION W/ INTRAOCULAR LENS IMPLANT Right 2011  . [place]  . CORONARY ANGIOPLASTY WITH STENT PLACEMENT  1999--2005   1999 - OM2 BMS; 2001: p-mCx NIR BMS 3.5 x 12; 10/2001: p-mRCA Express II BMS - 3.5 x 16 - 3.0 x 24.   . ESOPHAGOGASTRODUODENOSCOPY  12/20/2011   Procedure: ESOPHAGOGASTRODUODENOSCOPY (EGD);  Surgeon: Beryle Beams, MD;  Location: [facility];  Service: Endoscopy;  Laterality: N/A;  EGD with balloon dilation  . EYE SURGERY    . FEMORAL BYPASS  1999; 2000   Bilateral Fem-AK POP bypass.   Marland Kitchen PERIPHERAL VASCULAR CATHETERIZATION N/A 12/01/2015   Procedure: Abdominal Aortogram w/Lower Extremity;  Surgeon: Elam Dutch, MD;  Location: [place] [facility];  Service: Cardiovascular;  Laterality: N/A;  . PERIPHERAL VASCULAR CATHETERIZATION Right 12/22/2015   Procedure: Peripheral Vascular Intervention;  Surgeon: Rosetta Posner, MD;  Location: [place] [facility];  Service: Cardiovascular;  Laterality: Right;  EXternal iliac  . SAVORY DILATION  12/20/2011   Procedure: SAVORY DILATION;  Surgeon: Beryle Beams, MD;  Location: [facility];  Service: Endoscopy;  Laterality: N/A;  . TEE WITHOUT CARDIOVERSION  07/26/2011   Procedure:  TRANSESOPHAGEAL ECHOCARDIOGRAM (TEE);  Surgeon: Pixie Casino, MD;  Location: [place];  Service: Cardiovascular;  Laterality: N/A;  .  TONSILLECTOMY  1969  . TRANSTHORACIC ECHOCARDIOGRAM  March 2016   Normal LV size and function EF 55-60%. No regional wall motion and amount is. No significant valvular lesions.  . TUBAL LIGATION  1988  . VAGINAL HYSTERECTOMY  1988    Allergies  Allergen Reactions  . Tape Hives and Other (See Comments)    USE PAPER TAPE ONLY- Adhesive peels off skin-makes pt. Raw.  . Isosorbide Other (See Comments)    Drops BP too low  . Nicoderm [Nicotine] Rash    To the PATCH only, breakouts on skin    Current Outpatient Prescriptions  Medication Sig Dispense Refill  . acetaminophen (TYLENOL) 500 MG tablet Take 1,000 mg by mouth 2 (two) times daily as needed for moderate pain or headache.     . albuterol (PROVENTIL HFA;VENTOLIN HFA) 108 (90 BASE) MCG/ACT inhaler Inhale 2 puffs into the lungs every 6 (six) hours as needed for wheezing or shortness of breath. 1 Inhaler 0  . aspirin EC 81 MG tablet Take 1 tablet (81 mg total) by mouth daily. 30 tablet 10  . buPROPion (WELLBUTRIN XL) 300 MG 24 hr tablet Take 300 mg by mouth daily.     . clopidogrel (PLAVIX) 75 MG tablet Take 1 tablet (75 mg total) by mouth daily. 30 tablet 12  . docusate sodium (COLACE) 100 MG capsule Take 100 mg by mouth daily.    . fish oil-omega-3 fatty acids 1000 MG capsule Take 1 g by mouth 2 (two) times daily.     . fluticasone (FLONASE) 50 MCG/ACT nasal spray Place 2 sprays into both nostrils daily. 16 g 2  . fluticasone furoate-vilanterol (BREO ELLIPTA) 100-25 MCG/INH AEPB Inhale 1 puff into the lungs daily. 60 each 5  . insulin glargine (LANTUS) 100 UNIT/ML injection Inject into the skin every morning.    . insulin lispro (HUMALOG) 100 UNIT/ML injection Inject 3 Units into the skin 3 (three) times daily with meals as needed for high blood sugar.     . ipratropium-albuterol (DUONEB) 0.5-2.5  (3) MG/3ML SOLN Take 3 mLs by nebulization every 6 (six) hours. (Patient taking differently: Take 3 mLs by nebulization 2 (two) times daily as needed (for wheezingt or shortness of breath). ) 360 mL 3  . loratadine (CLARITIN) 10 MG tablet Take 1 tablet (10 mg total) by mouth daily. 30 tablet 0  . Multiple Vitamins-Minerals (MULTIVITAMIN & MINERAL PO) Take 1 tablet by mouth daily.    Marland Kitchen neomycin-bacitracin-polymyxin (NEOSPORIN) ointment Apply 1 application topically every 12 (twelve) hours. For open wound    . nitroGLYCERIN (NITROLINGUAL) 0.4 MG/SPRAY spray Place 1 spray under the tongue every 5 (five) minutes as needed for chest pain. 12 g 0  . omeprazole (PRILOSEC) 20 MG capsule Take 20 mg by mouth daily.     . rosuvastatin (CRESTOR) 20 MG tablet Take 20 mg by mouth daily.    Marland Kitchen SPIRIVA RESPIMAT 2.5 MCG/ACT AERS 2 puffs daily.     No current facility-administered medications for this visit.      REVIEW OF SYSTEMS: See HPI for pertinent positives and negatives.  Physical Examination Vitals:   01/30/16 1536  BP: (!) 161/81  Pulse: 84  Resp: 18  Temp: 97.5 F (36.4 C)  SpO2: 97%  Weight: 125 lb 11.2 oz (57 kg)  Height: '5\' 6"'$  (1.676 m)   Body mass index is 20.29 kg/m.  General: A&O x 3, WDWN, thin female. Gait: antalgic, favors right foot Eyes: PERRLA. Pulmonary: limited air movement in all  fields, respirations are mildly labored at rest with moist cough, faint expiratory wheezes in all fields.  Cardiac: regular rhythm, no detected murmur.     Carotid Bruits Left Right   Negative positive  Aorta is not palpable. Radial pulses: 2+ palpable right, 1+ palpable left   VASCULAR EXAM: Extremities with ischemic changes: all right toes are ruddy, without Gangrene; with open wounds: 0.8 cm cm x 1 cm shallow ulcer, decreased in size from 1 cm x 1.3 cm when I evaluated her on 11/14/15, no drainage but with moist base, no erythema, right  lower leg, lateral aspect.     LE Pulses LEFT RIGHT   FEMORAL 2+ palpable 1+ palpable    POPLITEAL 2+ palpable  not palpable   POSTERIOR TIBIAL not palpable  not palpable    DORSALIS PEDIS  ANTERIOR TIBIAL 1+ palpable not palpable    Abdomen: soft, NT, no palpable masses. Skin: no rashes, see Extremities. Musculoskeletal: no muscle wasting or atrophy. Moderate kyphosis. Neurologic: A&O X 3; Appropriate Affect ; SENSATION: normal; MOTOR FUNCTION: moving all extremities equally, motor strength 5/5 in right upper and lower extremities, 4/5 in left upper and lower extremities. Speech is fluent/normal. CN 2-12 intact.   Non-Invasive Vascular Imaging:   ASSESSMENT:  Brenda [name] is a 65 y.o. female who is s/p right femoral to popliteal bypas graft 06/14/97 and then a revision with extension from prior above knee popliteal anastomosis to below knee popliteal artery with reversed small saphenous vein on 02/05/13. She also has a hx of left femoral to popliteal bypass graft with translocated non-reversed saphenous vein on 06/10/1998.  She then required right external iliac angioplasty and stenting on 12/22/2015; this was done for right foot rest pain and ulceration to right lateral calf. Her right calf ulcer is improving/contracting since this last revascularization procedure.   She had a stroke in 2000 as manifested by left hemiparesis, expressive aphasia has resolved, has had several TIA's since then with slurred speech and trouble writing with her right hand, the symptoms resolve quickly, last episode was about 2013.    Her atherosclerotic risk factors include uncontrolled DM (she is working with an endocrinologist) and active smoking since age 82.   DATA Today's  carotid Duplex suggests right internal carotid artery stenosis less than 40%, left ICA stenosis increased to 40-59% from 40% compared to exam on 06/08/14.  Both vertebral arteries are antegrade, both subclavian arteries are multiphasic.   Today's bilateral LE arterial duplex suggests bilateral fem-pop bypass grafts with no significant stenosis.  Known occlusion of the right above knee to below knee jump graft. Biphasic right CFA flow is suggestive of a patent right external iliac artery stent without significant stenosis.  No significant change compared to exam of 07/25/15.  ABI's on 01/12/16: right improved to 77% from 52% since the July 2017 EIA stent placed, monophasic waveforms. Left remains normal with triphasic waveforms.   Face to face time with patient was 25 minutes. Over 50% of this time was spent on counseling and coordination of care.   PLAN:   The patient was counseled re smoking cessation and given several free resources re smoking cessation.  Based on today's exam and non-invasive vascular lab results, the patient will follow up in 3 months with the following tests: ABI's and right iliac artery stent duplex; bilateral LE arterial duplex and carotid duplex in a year. I discussed in depth with the patient the nature of atherosclerosis, and emphasized the importance of maximal medical management  including strict control of blood pressure, blood glucose, and lipid levels, obtaining regular exercise, and cessation of smoking.  The patient is aware that without maximal medical management the underlying atherosclerotic disease process will progress, limiting the benefit of any interventions.  The patient was given information about stroke prevention and what symptoms should prompt the patient to seek immediate medical care.  The patient was given information about PAD including signs, symptoms, treatment, what symptoms should prompt the patient to seek immediate medical care, and risk  reduction measures to take. Thank you for allowing [place] to participate in this patient's care.  Clemon Chambers, RN, MSN, FNP-C [facility] Office: [phone]  Clinic MD: Early 01/30/2016 3:45 PM

## 2016-02-01 NOTE — Progress Notes (Signed)
I agree with the above plan 

## 2016-02-02 NOTE — Addendum Note (Signed)
Addended by: Thresa Ross C on: 02/02/2016 03:57 PM   Modules accepted: Orders

## 2016-02-13 ENCOUNTER — Encounter (HOSPITAL_BASED_OUTPATIENT_CLINIC_OR_DEPARTMENT_OTHER): Payer: Medicare Other | Attending: Surgery

## 2016-02-13 ENCOUNTER — Ambulatory Visit (INDEPENDENT_AMBULATORY_CARE_PROVIDER_SITE_OTHER)
Admission: RE | Admit: 2016-02-13 | Discharge: 2016-02-13 | Disposition: A | Discharge status: Home / Self Care | Payer: Medicare Other | Source: Ambulatory Visit | Attending: Pulmonary Disease | Admitting: Pulmonary Disease

## 2016-02-13 ENCOUNTER — Encounter: Payer: Self-pay | Admitting: Pulmonary Disease

## 2016-02-13 ENCOUNTER — Ambulatory Visit (INDEPENDENT_AMBULATORY_CARE_PROVIDER_SITE_OTHER): Payer: Medicare Other | Admitting: Pulmonary Disease

## 2016-02-13 VITALS — BP 132/66 | HR 80 | Ht 66.0 in | Wt 124.0 lb

## 2016-02-13 DIAGNOSIS — Z85810 Personal history of malignant neoplasm of tongue: Secondary | ICD-10-CM | POA: Diagnosis not present

## 2016-02-13 DIAGNOSIS — Z9861 Coronary angioplasty status: Secondary | ICD-10-CM | POA: Diagnosis not present

## 2016-02-13 DIAGNOSIS — E1151 Type 2 diabetes mellitus with diabetic peripheral angiopathy without gangrene: Secondary | ICD-10-CM | POA: Insufficient documentation

## 2016-02-13 DIAGNOSIS — R05 Cough: Secondary | ICD-10-CM | POA: Diagnosis not present

## 2016-02-13 DIAGNOSIS — Z7982 Long term (current) use of aspirin: Secondary | ICD-10-CM | POA: Diagnosis not present

## 2016-02-13 DIAGNOSIS — Z720 Tobacco use: Secondary | ICD-10-CM

## 2016-02-13 DIAGNOSIS — F17218 Nicotine dependence, cigarettes, with other nicotine-induced disorders: Secondary | ICD-10-CM | POA: Diagnosis not present

## 2016-02-13 DIAGNOSIS — E11622 Type 2 diabetes mellitus with other skin ulcer: Secondary | ICD-10-CM | POA: Insufficient documentation

## 2016-02-13 DIAGNOSIS — J432 Centrilobular emphysema: Secondary | ICD-10-CM | POA: Diagnosis not present

## 2016-02-13 DIAGNOSIS — Z794 Long term (current) use of insulin: Secondary | ICD-10-CM | POA: Diagnosis not present

## 2016-02-13 DIAGNOSIS — I2510 Atherosclerotic heart disease of native coronary artery without angina pectoris: Secondary | ICD-10-CM | POA: Diagnosis not present

## 2016-02-13 DIAGNOSIS — F17210 Nicotine dependence, cigarettes, uncomplicated: Secondary | ICD-10-CM | POA: Insufficient documentation

## 2016-02-13 DIAGNOSIS — Z7902 Long term (current) use of antithrombotics/antiplatelets: Secondary | ICD-10-CM | POA: Diagnosis not present

## 2016-02-13 DIAGNOSIS — R059 Cough, unspecified: Secondary | ICD-10-CM

## 2016-02-13 DIAGNOSIS — Z8673 Personal history of transient ischemic attack (TIA), and cerebral infarction without residual deficits: Secondary | ICD-10-CM | POA: Diagnosis not present

## 2016-02-13 DIAGNOSIS — Z79899 Other long term (current) drug therapy: Secondary | ICD-10-CM | POA: Insufficient documentation

## 2016-02-13 DIAGNOSIS — I70232 Atherosclerosis of native arteries of right leg with ulceration of calf: Secondary | ICD-10-CM | POA: Insufficient documentation

## 2016-02-13 DIAGNOSIS — L97212 Non-pressure chronic ulcer of right calf with fat layer exposed: Secondary | ICD-10-CM | POA: Insufficient documentation

## 2016-02-13 DIAGNOSIS — Z95820 Peripheral vascular angioplasty status with implants and grafts: Secondary | ICD-10-CM | POA: Diagnosis not present

## 2016-02-13 MED ORDER — PREDNISONE 10 MG PO TABS
20.0000 mg | ORAL_TABLET | Freq: Every day | ORAL | 0 refills | Status: AC
Start: 2016-02-13 — End: 2016-02-18

## 2016-02-13 MED ORDER — AZITHROMYCIN 250 MG PO TABS
ORAL_TABLET | ORAL | 0 refills | Status: DC
Start: 2016-02-13 — End: 2016-03-27

## 2016-02-13 NOTE — Patient Instructions (Signed)
Take the prednisone taper and Z-Pak as prescribed We will call you with the results of the chest x-ray If your symptoms worsen or do not improve after 3-4 days then let us know Stop smoking Use the Nicotrol inhaler to help quit smoking Use albuterol 2-3 times a day for the next 3 days Keep taking your Breo and Spiriva We will see you back in 3 months or sooner if needed Get a flu shot in about a week

## 2016-02-13 NOTE — Assessment & Plan Note (Signed)
Unfortunately she is having another exacerbation of COPD. This may be due to a mild respiratory infection, but given her proclivity to have aspiration pneumonia we need to get a chest x-ray as she does have some focal findings on the right today.  Plan: Steroid taper Z-Pak Chest x-ray Continue Breo and Spiriva Counsel to quit smoking Flu shot in a week after she is better

## 2016-02-13 NOTE — Assessment & Plan Note (Signed)
Counseled at length to quit smoking today. Her husband quit smoking about 5 months ago. She actually was able to quit for half a day in the last few months which is a trial for her. She has failed both the nicotine patches and nicotine gum in the past due to scan and denture problems. I recommended the Nicotrol inhaler when she tries to quit again. Will prescribe today.

## 2016-02-13 NOTE — Addendum Note (Signed)
Addended by: Len Blalock on: 02/13/2016 10:31 AM   Modules accepted: Orders

## 2016-02-13 NOTE — Progress Notes (Signed)
Subjective:    Patient ID: Brenda [name], female    DOB: 1950-11-11, 65 y.o.   MRN: [id number]  Synopsis: Severe COPD, still actively smoking as of June 2017, Also with dysphagia and recurrent aspiration leading to recurrent episodes of pneumonia. She also has coronary artery disease and underwent two-vessel coronary intervention in 2017. Simple spirometry FEV1 June 2016 0.67 L (27% predicted) with clear airflow obstruction  HPI Chief Complaint  Patient presents with  . Follow-up    pt states she is doing well, notes increased SOB X1 week.     No major changes lately.  No pneumonia or heart issues. She is supposed to see the cardiology team next month.  She says her breathing has been a little worse in the last week, she has used her nebulizers in the last week.  She has some congestion in her throat which is thick.  She has been using her mucinex more in the lat few days.  Her breahting has been worse, her oxygen has been lower, and she has had less energy.  She had a chill on Sunday, no fever.  No sick contacts. Still taking Breo and Spiriva.  She is still smoking 1/2 packs per day.  She tried to quit one day since the last visit, she tried to quit cold [place].  She can't use the patch or gum.  She hasn't used a lozenge.     Past Medical History:  Diagnosis Date  . Arthritis    "hands" (11/26/2012)  . CAD S/P percutaneous coronary angioplasty 1999; 2001;2003, 10/2014   a) BMS- mCx; b) re-do PCI for prog of Dz (NIR BMS 3.5 x 12); c) staged PCI: p-mRCA Express II BMS 3.0x24 & 3.5 x 16 --> p-mLAD Cyper DES 3.0 x 13 & PTCA of D2 (2.5 balloon); 10/2015 PCI p-m LAD Promus DES 2.75 x 38 (covers pre&post-stent ISR), POBA of ostial rPDA  . Cancer of base of tongue ([place]) 2010   "& lymph nodes @ right neck" (11/26/2012  . Carotid artery occlusion   . Chronic bronchitis ([place])   . COPD (chronic obstructive pulmonary disease) with emphysema ([place])   . Depression   . GERD (gastroesophageal reflux  disease)   . H/O hiatal hernia   . History of blood transfusion   . History of DVT (deep vein thrombosis)   . Hx of radiation therapy   . Hx of radiation therapy 01/16/09 - 03/06/09   base of tongue, right neck node  . Hypercholesteremia   . Myocardial infarction [facility]) 1999, 2003  . PAD (peripheral artery disease) ([name])    status post bilateral femoropopliteal bypass grafting --> Dr. Sherren Mocha Early; -  02/2013 Revision of R Fem-AKPop to BK Pop.  . Patent foramen ovale February 2013   Small. Small PFO noted on TTE for TIA/CVA R-L + Bubble study with valsalva  . Pneumonia   . Stroke [facility])    left side weakness remains (11/26/2012)  . Transient ischemic attack (TIA) February 2013  . Type II diabetes mellitus ([place])       Review of Systems  Constitutional: Positive for chills. Negative for fatigue and fever.  HENT: Negative for postnasal drip, rhinorrhea and sinus pressure.   Respiratory: Positive for cough, shortness of breath and wheezing.   Cardiovascular: Negative for chest pain, palpitations and leg swelling.       Objective:   Physical Exam Vitals:   02/13/16 1004  BP: 132/66  Pulse: 80  SpO2: 97%  Weight:  124 lb (56.2 kg)  Height: '5\' 6"'$  (1.676 m)   RA  Gen: chronically ill appearing, kyphosis HENT: OP clear, TM's clear, neck supple PULM: Wheezing bilaterally, some crackles right base, poor movement CV: RRR, no mgr, trace edema GI: BS+, soft, nontender Psyche: normal mood and affect  Records from last week reviewed were she was seen in the neurology clinic for evaluation of stroke risk.    Assessment & Plan:  Tobacco abuse Counseled at length to quit smoking today. Her husband quit smoking about 5 months ago. She actually was able to quit for half a day in the last few months which is a trial for her. She has failed both the nicotine patches and nicotine gum in the past due to scan and denture problems. I recommended the Nicotrol inhaler when she tries to quit again.  Will prescribe today.  COPD (chronic obstructive pulmonary disease) ([place]) Unfortunately she is having another exacerbation of COPD. This may be due to a mild respiratory infection, but given her proclivity to have aspiration pneumonia we need to get a chest x-ray as she does have some focal findings on the right today.  Plan: Steroid taper Z-Pak Chest x-ray Continue Breo and Spiriva Counsel to quit smoking Flu shot in a week after she is better    Current Outpatient Prescriptions:  .  acetaminophen (TYLENOL) 500 MG tablet, Take 1,000 mg by mouth 2 (two) times daily as needed for moderate pain or headache. , Disp: , Rfl:  .  albuterol (PROVENTIL HFA;VENTOLIN HFA) 108 (90 BASE) MCG/ACT inhaler, Inhale 2 puffs into the lungs every 6 (six) hours as needed for wheezing or shortness of breath., Disp: 1 Inhaler, Rfl: 0 .  aspirin EC 81 MG tablet, Take 1 tablet (81 mg total) by mouth daily., Disp: 30 tablet, Rfl: 10 .  buPROPion (WELLBUTRIN XL) 300 MG 24 hr tablet, Take 300 mg by mouth daily. , Disp: , Rfl:  .  clopidogrel (PLAVIX) 75 MG tablet, Take 1 tablet (75 mg total) by mouth daily., Disp: 30 tablet, Rfl: 12 .  docusate sodium (COLACE) 100 MG capsule, Take 100 mg by mouth daily., Disp: , Rfl:  .  fish oil-omega-3 fatty acids 1000 MG capsule, Take 1 g by mouth 2 (two) times daily. , Disp: , Rfl:  .  fluticasone (FLONASE) 50 MCG/ACT nasal spray, Place 2 sprays into both nostrils daily., Disp: 16 g, Rfl: 2 .  fluticasone furoate-vilanterol (BREO ELLIPTA) 100-25 MCG/INH AEPB, Inhale 1 puff into the lungs daily., Disp: 60 each, Rfl: 5 .  insulin glargine (LANTUS) 100 UNIT/ML injection, Inject into the skin every morning., Disp: , Rfl:  .  insulin lispro (HUMALOG) 100 UNIT/ML injection, Inject 3 Units into the skin 3 (three) times daily with meals as needed for high blood sugar. , Disp: , Rfl:  .  ipratropium-albuterol (DUONEB) 0.5-2.5 (3) MG/3ML SOLN, Take 3 mLs by nebulization every 6 (six)  hours. (Patient taking differently: Take 3 mLs by nebulization 2 (two) times daily as needed (for wheezingt or shortness of breath). ), Disp: 360 mL, Rfl: 3 .  loratadine (CLARITIN) 10 MG tablet, Take 1 tablet (10 mg total) by mouth daily., Disp: 30 tablet, Rfl: 0 .  Multiple Vitamins-Minerals (MULTIVITAMIN & MINERAL PO), Take 1 tablet by mouth daily., Disp: , Rfl:  .  neomycin-bacitracin-polymyxin (NEOSPORIN) ointment, Apply 1 application topically every 12 (twelve) hours. For open wound, Disp: , Rfl:  .  nitroGLYCERIN (NITROLINGUAL) 0.4 MG/SPRAY spray, Place 1 spray under the  tongue every 5 (five) minutes as needed for chest pain., Disp: 12 g, Rfl: 0 .  omeprazole (PRILOSEC) 20 MG capsule, Take 20 mg by mouth daily. , Disp: , Rfl:  .  rosuvastatin (CRESTOR) 20 MG tablet, Take 20 mg by mouth daily., Disp: , Rfl:  .  SPIRIVA RESPIMAT 2.5 MCG/ACT AERS, 2 puffs daily., Disp: , Rfl: q

## 2016-02-19 ENCOUNTER — Ambulatory Visit (INDEPENDENT_AMBULATORY_CARE_PROVIDER_SITE_OTHER)
Admission: RE | Admit: 2016-02-19 | Discharge: 2016-02-19 | Disposition: A | Discharge status: Home / Self Care | Payer: Medicare Other | Source: Ambulatory Visit | Attending: Acute Care | Admitting: Acute Care

## 2016-02-19 DIAGNOSIS — R911 Solitary pulmonary nodule: Secondary | ICD-10-CM

## 2016-02-22 ENCOUNTER — Telehealth: Payer: Self-pay

## 2016-02-22 DIAGNOSIS — F17210 Nicotine dependence, cigarettes, uncomplicated: Principal | ICD-10-CM

## 2016-02-22 NOTE — Telephone Encounter (Signed)
Pt had LDCT done 9/18 and is requesting these results. Please advise Judson Roch thanks

## 2016-02-22 NOTE — Telephone Encounter (Signed)
I have called the results of the scan to [name]. I explained that her scan was read as a Lung  RADS 3, nodules that are probably benign findings, short term follow up suggested: includes nodules with a low likelihood of becoming a clinically active cancer. Radiology recommends a 6 month repeat LDCT follow up. I explained to her that the nodule we were  following in her left lower lobe has almost resolved. I explained that there is a new nodule in her left upper lobe ( 4.1 mm) that has triggered the need for a 6 month follow up. I told her we will schedule her for a follow up scan in 08/2016, and that I will fax the scan results to her PCP Dr. [name]. She verbalized understanding.

## 2016-02-26 DIAGNOSIS — E11622 Type 2 diabetes mellitus with other skin ulcer: Secondary | ICD-10-CM | POA: Diagnosis not present

## 2016-03-04 ENCOUNTER — Encounter (HOSPITAL_BASED_OUTPATIENT_CLINIC_OR_DEPARTMENT_OTHER): Payer: Medicare Other | Attending: Internal Medicine

## 2016-03-04 DIAGNOSIS — W19XXXA Unspecified fall, initial encounter: Secondary | ICD-10-CM | POA: Insufficient documentation

## 2016-03-04 DIAGNOSIS — F17210 Nicotine dependence, cigarettes, uncomplicated: Secondary | ICD-10-CM | POA: Diagnosis not present

## 2016-03-04 DIAGNOSIS — L97211 Non-pressure chronic ulcer of right calf limited to breakdown of skin: Secondary | ICD-10-CM | POA: Diagnosis not present

## 2016-03-04 DIAGNOSIS — E11622 Type 2 diabetes mellitus with other skin ulcer: Secondary | ICD-10-CM | POA: Insufficient documentation

## 2016-03-04 DIAGNOSIS — Z8673 Personal history of transient ischemic attack (TIA), and cerebral infarction without residual deficits: Secondary | ICD-10-CM | POA: Insufficient documentation

## 2016-03-04 DIAGNOSIS — Z9861 Coronary angioplasty status: Secondary | ICD-10-CM | POA: Diagnosis not present

## 2016-03-04 DIAGNOSIS — Z9221 Personal history of antineoplastic chemotherapy: Secondary | ICD-10-CM | POA: Diagnosis not present

## 2016-03-04 DIAGNOSIS — J449 Chronic obstructive pulmonary disease, unspecified: Secondary | ICD-10-CM | POA: Insufficient documentation

## 2016-03-04 DIAGNOSIS — S41112A Laceration without foreign body of left upper arm, initial encounter: Secondary | ICD-10-CM | POA: Insufficient documentation

## 2016-03-04 DIAGNOSIS — Z95820 Peripheral vascular angioplasty status with implants and grafts: Secondary | ICD-10-CM | POA: Insufficient documentation

## 2016-03-04 DIAGNOSIS — I2510 Atherosclerotic heart disease of native coronary artery without angina pectoris: Secondary | ICD-10-CM | POA: Diagnosis not present

## 2016-03-04 DIAGNOSIS — I70232 Atherosclerosis of native arteries of right leg with ulceration of calf: Secondary | ICD-10-CM | POA: Diagnosis not present

## 2016-03-04 DIAGNOSIS — L97811 Non-pressure chronic ulcer of other part of right lower leg limited to breakdown of skin: Secondary | ICD-10-CM | POA: Insufficient documentation

## 2016-03-04 DIAGNOSIS — E1151 Type 2 diabetes mellitus with diabetic peripheral angiopathy without gangrene: Secondary | ICD-10-CM | POA: Insufficient documentation

## 2016-03-04 DIAGNOSIS — I252 Old myocardial infarction: Secondary | ICD-10-CM | POA: Diagnosis not present

## 2016-03-04 DIAGNOSIS — Z923 Personal history of irradiation: Secondary | ICD-10-CM | POA: Insufficient documentation

## 2016-03-04 DIAGNOSIS — Z85810 Personal history of malignant neoplasm of tongue: Secondary | ICD-10-CM | POA: Insufficient documentation

## 2016-03-04 DIAGNOSIS — Z86718 Personal history of other venous thrombosis and embolism: Secondary | ICD-10-CM | POA: Diagnosis not present

## 2016-03-07 ENCOUNTER — Telehealth: Payer: Self-pay | Admitting: Pulmonary Disease

## 2016-03-07 NOTE — Telephone Encounter (Signed)
Spoke with Brenda [name] who states she just spoke with Brenda [name] and has additional questions.  Please advise. Thanks.

## 2016-03-11 DIAGNOSIS — E11622 Type 2 diabetes mellitus with other skin ulcer: Secondary | ICD-10-CM | POA: Diagnosis not present

## 2016-03-11 NOTE — Telephone Encounter (Signed)
Spoke with Nita @ [place]. She states that she received a call from [facility] about the pt's LDCT Cancer screening. Pt has already had this done. Nothing further needed.

## 2016-03-25 DIAGNOSIS — E11622 Type 2 diabetes mellitus with other skin ulcer: Secondary | ICD-10-CM | POA: Diagnosis not present

## 2016-03-26 ENCOUNTER — Encounter (HOSPITAL_COMMUNITY): Payer: Self-pay | Admitting: *Deleted

## 2016-03-27 ENCOUNTER — Ambulatory Visit (INDEPENDENT_AMBULATORY_CARE_PROVIDER_SITE_OTHER)
Admission: RE | Admit: 2016-03-27 | Discharge: 2016-03-27 | Disposition: A | Discharge status: Home / Self Care | Payer: Medicare Other | Source: Ambulatory Visit | Attending: Pulmonary Disease | Admitting: Pulmonary Disease

## 2016-03-27 ENCOUNTER — Encounter: Payer: Self-pay | Admitting: Pulmonary Disease

## 2016-03-27 ENCOUNTER — Telehealth: Payer: Self-pay | Admitting: Pulmonary Disease

## 2016-03-27 ENCOUNTER — Ambulatory Visit (INDEPENDENT_AMBULATORY_CARE_PROVIDER_SITE_OTHER): Payer: Medicare Other | Admitting: Pulmonary Disease

## 2016-03-27 VITALS — BP 100/70 | HR 96 | Temp 98.4°F | Ht 66.0 in | Wt 125.4 lb

## 2016-03-27 DIAGNOSIS — J432 Centrilobular emphysema: Secondary | ICD-10-CM

## 2016-03-27 MED ORDER — PREDNISONE 10 MG PO TABS: ORAL_TABLET | ORAL | 0 refills | Status: DC

## 2016-03-27 MED ORDER — LEVOFLOXACIN 500 MG PO TABS: 500.0000 mg | ORAL_TABLET | Freq: Every day | ORAL | 0 refills | Status: DC

## 2016-03-27 NOTE — Telephone Encounter (Signed)
Spoke with pt. She has been added on to PM's schedule today at 2:30pm. Nothing further was needed

## 2016-03-27 NOTE — Progress Notes (Signed)
Brenda [name]    [id number]    Sep 28, 1950  Primary Care Physician:MORROW, Marjory Lies, MD  Referring Physician: [name], MD [place] [address], [place] [address]  Chief complaint:  Acute visit for cough, dyspnea.  HPI: Brenda [name] is a 65 year old with severe COPD, active smoker, recurrent aspiration pneumonias. She complains of 2 days of increased cough with white/yellow mucus production, dyspnea, wheezing, chest tightness. She denies any fevers, chills. She still smoking but is trying to cut down.   Outpatient Encounter Prescriptions as of 03/27/2016  Medication Sig  . acetaminophen (TYLENOL) 500 MG tablet Take 1,000 mg by mouth 2 (two) times daily as needed for moderate pain or headache.   . albuterol (PROVENTIL HFA;VENTOLIN HFA) 108 (90 BASE) MCG/ACT inhaler Inhale 2 puffs into the lungs every 6 (six) hours as needed for wheezing or shortness of breath.  Marland Kitchen aspirin EC 81 MG tablet Take 1 tablet (81 mg total) by mouth daily.  Marland Kitchen buPROPion (WELLBUTRIN XL) 300 MG 24 hr tablet Take 300 mg by mouth daily.   . clopidogrel (PLAVIX) 75 MG tablet Take 1 tablet (75 mg total) by mouth daily.  Marland Kitchen docusate sodium (COLACE) 100 MG capsule Take 100 mg by mouth daily.  . fish oil-omega-3 fatty acids 1000 MG capsule Take 1 g by mouth 2 (two) times daily.   . fluticasone (FLONASE) 50 MCG/ACT nasal spray Place 2 sprays into both nostrils daily.  . fluticasone furoate-vilanterol (BREO ELLIPTA) 100-25 MCG/INH AEPB Inhale 1 puff into the lungs daily.  . insulin glargine (LANTUS) 100 UNIT/ML injection Inject into the skin every morning.  . insulin lispro (HUMALOG) 100 UNIT/ML injection Inject 3 Units into the skin 3 (three) times daily with meals as needed for high blood sugar.   . ipratropium-albuterol (DUONEB) 0.5-2.5 (3) MG/3ML SOLN Take 3 mLs by nebulization every 6 (six) hours. (Patient taking differently: Take 3 mLs by nebulization 2 (two) times daily as needed (for  wheezingt or shortness of breath). )  . loratadine (CLARITIN) 10 MG tablet Take 1 tablet (10 mg total) by mouth daily.  . Multiple Vitamins-Minerals (MULTIVITAMIN & MINERAL PO) Take 1 tablet by mouth daily.  Marland Kitchen neomycin-bacitracin-polymyxin (NEOSPORIN) ointment Apply 1 application topically every 12 (twelve) hours. For open wound  . nitroGLYCERIN (NITROLINGUAL) 0.4 MG/SPRAY spray Place 1 spray under the tongue every 5 (five) minutes as needed for chest pain.  Marland Kitchen omeprazole (PRILOSEC) 20 MG capsule Take 20 mg by mouth daily.   . rosuvastatin (CRESTOR) 20 MG tablet Take 20 mg by mouth daily.  Marland Kitchen SPIRIVA RESPIMAT 2.5 MCG/ACT AERS 2 puffs daily.  . [DISCONTINUED] azithromycin (ZITHROMAX) 250 MG tablet '500mg'$  on day one, '250mg'$  daily x4 days (Patient not taking: Reported on 03/27/2016)   No facility-administered encounter medications on file as of 03/27/2016.     Allergies as of 03/27/2016 - Review Complete 03/27/2016  Allergen Reaction Noted  . Tape Hives and Other (See Comments) 05/27/2011  . Isosorbide Other (See Comments) 10/03/2015  . Nicoderm [nicotine] Rash 01/03/2012    Past Medical History:  Diagnosis Date  . Arthritis    "hands" (11/26/2012)  . CAD S/P percutaneous coronary angioplasty 1999; 2001;2003, 10/2014   a) BMS- mCx; b) re-do PCI for prog of Dz (NIR BMS 3.5 x 12); c) staged PCI: p-mRCA Express II BMS 3.0x24 & 3.5 x 16 --> p-mLAD Cyper DES 3.0 x 13 & PTCA of D2 (2.5 balloon); 10/2015 PCI p-m LAD Promus  DES 2.75 x 38 (covers pre&post-stent ISR), POBA of ostial rPDA  . Cancer of base of tongue ([place]) 2010   "& lymph nodes @ right neck" (11/26/2012  . Carotid artery occlusion   . Chronic bronchitis ([place])   . COPD (chronic obstructive pulmonary disease) with emphysema ([place])   . Depression   . GERD (gastroesophageal reflux disease)   . H/O hiatal hernia   . History of blood transfusion   . History of DVT (deep vein thrombosis)   . Hx of radiation therapy   . Hx of radiation therapy  01/16/09 - 03/06/09   base of tongue, right neck node  . Hypercholesteremia   . Myocardial infarction 1999, 2003  . PAD (peripheral artery disease) ([name])    status post bilateral femoropopliteal bypass grafting --> Dr. Sherren Mocha Early; -  02/2013 Revision of R Fem-AKPop to BK Pop.  . Patent foramen ovale February 2013   Small. Small PFO noted on TTE for TIA/CVA R-L + Bubble study with valsalva  . Pneumonia   . Stroke [facility])    left side weakness remains (11/26/2012)  . Transient ischemic attack (TIA) February 2013  . Type II diabetes mellitus ([place])     Past Surgical History:  Procedure Laterality Date  . ANKLE FRACTURE SURGERY Right 2007   "got a metal plate and 8 screws in it" (11/26/2012)  . APPENDECTOMY  1962  . BYPASS GRAFT POPLITEAL TO POPLITEAL Right 02/05/2013   Procedure: BYPASS GRAFT ABOVE KNEE POPLITEAL TO BELOW KNEE POPLITEAL WITH SMALL SAPHANOUS VEIN;  Surgeon: Rosetta Posner, MD;  Location: [facility];  Service: Vascular;  Laterality: Right;  . CARDIAC CATHETERIZATION  1999; 10/2005   5/'07: mildLAD 20-30%, patent stent, diags- no Dz. RCA 2 BMS patent ~20% ISR.   Marland Kitchen CARDIAC CATHETERIZATION N/A 10/06/2015   Procedure: Left Heart Cath and Coronary Angiography;  Surgeon: Leonie Man, MD;  Location: [facility];  Service: Cardiovascular; p-mLAD 99% pre-stent, 60% ISR & 80% post-stent, o-RPDA 90%, ~10% ISR in 2 RCA (Prox & mid) BMS,~20% ISR in pCx BMS with 5% OM2 BMS.    Marland Kitchen CARDIAC CATHETERIZATION N/A 10/06/2015   Procedure: Coronary Stent Intervention;  Surgeon: Leonie Man, MD;  Location: [facility];  Service: Cardiovascular; p-m LAD Promus DES 2.75 x 38 (tapered post-dilatoin)  . CARDIAC CATHETERIZATION N/A 10/06/2015   Procedure: Coronary Balloon Angioplasty;  Surgeon: Leonie Man, MD;  Location: [place];  Service: Cardiovascular;  POBA of Ostial rPDA.  Marland Kitchen CARPAL TUNNEL RELEASE Right 1980's?  Marland Kitchen CATARACT EXTRACTION W/ INTRAOCULAR LENS IMPLANT Right 2011  . [place]  . CORONARY ANGIOPLASTY WITH STENT PLACEMENT  1999--2005   1999 - OM2 BMS; 2001: p-mCx NIR BMS 3.5 x 12; 10/2001: p-mRCA Express II BMS - 3.5 x 16 - 3.0 x 24.   . ESOPHAGOGASTRODUODENOSCOPY  12/20/2011   Procedure: ESOPHAGOGASTRODUODENOSCOPY (EGD);  Surgeon: Beryle Beams, MD;  Location: [facility];  Service: Endoscopy;  Laterality: N/A;  EGD with balloon dilation  . EYE SURGERY    . FEMORAL BYPASS  1999; 2000   Bilateral Fem-AK POP bypass.   Marland Kitchen PERIPHERAL VASCULAR CATHETERIZATION N/A 12/01/2015   Procedure: Abdominal Aortogram w/Lower Extremity;  Surgeon: Elam Dutch, MD;  Location: [place] [facility];  Service: Cardiovascular;  Laterality: N/A;  . PERIPHERAL VASCULAR CATHETERIZATION Right 12/22/2015   Procedure: Peripheral Vascular Intervention;  Surgeon: Rosetta Posner, MD;  Location: [facility];  Service: Cardiovascular;  Laterality: Right;  EXternal iliac  . SAVORY DILATION  12/20/2011   Procedure: SAVORY DILATION;  Surgeon: Beryle Beams, MD;  Location: [facility];  Service: Endoscopy;  Laterality: N/A;  . TEE WITHOUT CARDIOVERSION  07/26/2011   Procedure: TRANSESOPHAGEAL ECHOCARDIOGRAM (TEE);  Surgeon: Pixie Casino, MD;  Location: [facility];  Service: Cardiovascular;  Laterality: N/A;  . TONSILLECTOMY  1969  . TRANSTHORACIC ECHOCARDIOGRAM  March 2016   Normal LV size and function EF 55-60%. No regional wall motion and amount is. No significant valvular lesions.  . TUBAL LIGATION  1988  . VAGINAL HYSTERECTOMY  1988    Family History  Problem Relation Age of Onset  . Cancer Mother     pancreatic  . Diabetes Mother   . Hyperlipidemia Mother   . Hypertension Mother   . Other Mother     varicose vein  . Cancer Father 72    throat  . Heart disease Father   . Heart attack Father   . Cancer Sister     breast  . Diabetes Sister   . Deep vein thrombosis Brother   . Diabetes Brother   . Hearing loss Brother   . Hyperlipidemia Brother   .  Hypertension Brother   . Heart attack Brother   . Clotting disorder Brother   . Diabetes Son   . Hyperlipidemia Son     Social History   Social History  . Marital status: Married    Spouse name: N/A  . Number of children: N/A  . Years of education: N/A   Occupational History  . house wife    Social History Main Topics  . Smoking status: Current Every Day Smoker    Packs/day: 1.00    Years: 50.00    Types: Cigarettes  . Smokeless tobacco: Never Used     Comment: up to 0.5 ppd 02/13/16  . Alcohol use 3.0 oz/week    5 Glasses of wine per week  . Drug use: No  . Sexual activity: Yes   Other Topics Concern  . Not on file   Social History Narrative  . No narrative on file     Review of systems: Review of Systems  Constitutional: Negative for fever and chills.  HENT: Negative.   Eyes: Negative for blurred vision.  Respiratory: as per HPI  Cardiovascular: Negative for chest pain and palpitations.  Gastrointestinal: Negative for vomiting, diarrhea, blood per rectum. Genitourinary: Negative for dysuria, urgency, frequency and hematuria.  Musculoskeletal: Negative for myalgias, back pain and joint pain.  Skin: Negative for itching and rash.  Neurological: Negative for dizziness, tremors, focal weakness, seizures and loss of consciousness.  Endo/Heme/Allergies: Negative for environmental allergies.  Psychiatric/Behavioral: Negative for depression, suicidal ideas and hallucinations.  All other systems reviewed and are negative.   Physical Exam: Blood pressure 100/70, pulse 96, temperature 98.4 F (36.9 C), temperature source Oral, height '5\' 6"'$  (1.676 m), weight 125 lb 6.4 oz (56.9 kg), SpO2 95 %. Gen:      No acute distress HEENT:  EOMI, sclera anicteric Neck:     No masses; no thyromegaly Lungs:    Diffuse bilateral expiratory wheeze; normal respiratory effort CV:         Regular rate and rhythm; no murmurs Abd:      + bowel sounds; soft, non-tender; no palpable  masses, no distension Ext:    No edema; adequate peripheral perfusion Skin:      Warm and dry; no rash Neuro:  alert and oriented x 3 Psych: normal mood and affect  Data Reviewed: CXR 02/13/16. Hyperinflation, chronic bronchitis changes.  Spirometry 11/14/14 FVC 1.45 [45%) FEV1 0.67 (27%) F/46 Very severe obstruction  Assessment:  Very severe COPD with acute exacerbation. Recurrent pneumonia  Active smoker  We'll get a chest x-ray today to evaluate for any infiltrate. She'll get a course of Levaquin for 7 days and prednisone taper starting at 40 mg. She'll reduce dose by 10 mg every 2 days. Encouraged to quit smoking altogether.  Plan/Recommendations: - CXR - Levaquin 500 mg qd x 7 days - Prednisone taper starting at 40 mg.  Marshell Garfinkel MD [facility] Pager [phone] 03/27/2016, 2:32 PM  CC: [name], MD

## 2016-03-27 NOTE — Patient Instructions (Addendum)
We will start on levofloxacin 500 mg daily for 7 days. We will give her prednisone at 40 mg. Reduce dose to 10 mg every 2 days. You will be sent for an chest x-ray today to evaluate for pneumonia.  Follow-up as scheduled with Dr. Lake Bells. Please call if there is any worsening of symptoms.

## 2016-04-02 ENCOUNTER — Telehealth: Payer: Self-pay | Admitting: Pulmonary Disease

## 2016-04-02 NOTE — Telephone Encounter (Signed)
Needs OV.  

## 2016-04-02 NOTE — Telephone Encounter (Signed)
Spoke with pt husband who states pt started levaquin on 03-27-16, pt took last dose of levaquin this morning. Pt husband states pt hasn't improved. Pt is having sob, non prod cough, wheezing, back discomfort & increased fatigued and weakness. Pt is also taking mucinex  With no relief.  BQ please advise. Thanks.

## 2016-04-02 NOTE — Telephone Encounter (Signed)
Pt has been added to [facility] schedule for Wednesday 04/03/16 at 9:30am. Wayne-pt's spouse is aware of appt date and time. Nothing more needed at this time.

## 2016-04-03 ENCOUNTER — Inpatient Hospital Stay (HOSPITAL_COMMUNITY)
Admission: AD | Admit: 2016-04-03 | Discharge: 2016-04-09 | DRG: 190 | Disposition: A | Discharge status: Home / Self Care | Payer: Medicare Other | Source: Ambulatory Visit | Attending: Pulmonary Disease | Admitting: Pulmonary Disease

## 2016-04-03 ENCOUNTER — Inpatient Hospital Stay (HOSPITAL_COMMUNITY): Payer: Medicare Other

## 2016-04-03 ENCOUNTER — Encounter (HOSPITAL_COMMUNITY): Payer: Self-pay | Admitting: *Deleted

## 2016-04-03 ENCOUNTER — Ambulatory Visit (INDEPENDENT_AMBULATORY_CARE_PROVIDER_SITE_OTHER): Payer: Medicare Other | Admitting: Adult Health

## 2016-04-03 ENCOUNTER — Encounter: Payer: Self-pay | Admitting: Adult Health

## 2016-04-03 VITALS — BP 118/66 | HR 99 | Ht 66.0 in | Wt 120.2 lb

## 2016-04-03 DIAGNOSIS — J690 Pneumonitis due to inhalation of food and vomit: Secondary | ICD-10-CM | POA: Diagnosis present

## 2016-04-03 DIAGNOSIS — R739 Hyperglycemia, unspecified: Secondary | ICD-10-CM | POA: Diagnosis not present

## 2016-04-03 DIAGNOSIS — F17210 Nicotine dependence, cigarettes, uncomplicated: Secondary | ICD-10-CM | POA: Diagnosis present

## 2016-04-03 DIAGNOSIS — E1151 Type 2 diabetes mellitus with diabetic peripheral angiopathy without gangrene: Secondary | ICD-10-CM | POA: Diagnosis present

## 2016-04-03 DIAGNOSIS — E43 Unspecified severe protein-calorie malnutrition: Secondary | ICD-10-CM | POA: Insufficient documentation

## 2016-04-03 DIAGNOSIS — J441 Chronic obstructive pulmonary disease with (acute) exacerbation: Secondary | ICD-10-CM

## 2016-04-03 DIAGNOSIS — Z832 Family history of diseases of the blood and blood-forming organs and certain disorders involving the immune mechanism: Secondary | ICD-10-CM | POA: Diagnosis not present

## 2016-04-03 DIAGNOSIS — Z833 Family history of diabetes mellitus: Secondary | ICD-10-CM

## 2016-04-03 DIAGNOSIS — R079 Chest pain, unspecified: Secondary | ICD-10-CM | POA: Diagnosis not present

## 2016-04-03 DIAGNOSIS — Z794 Long term (current) use of insulin: Secondary | ICD-10-CM | POA: Diagnosis not present

## 2016-04-03 DIAGNOSIS — R0600 Dyspnea, unspecified: Secondary | ICD-10-CM

## 2016-04-03 DIAGNOSIS — J9621 Acute and chronic respiratory failure with hypoxia: Secondary | ICD-10-CM | POA: Diagnosis present

## 2016-04-03 DIAGNOSIS — E7800 Pure hypercholesterolemia, unspecified: Secondary | ICD-10-CM | POA: Diagnosis present

## 2016-04-03 DIAGNOSIS — E1165 Type 2 diabetes mellitus with hyperglycemia: Secondary | ICD-10-CM | POA: Diagnosis present

## 2016-04-03 DIAGNOSIS — I252 Old myocardial infarction: Secondary | ICD-10-CM | POA: Diagnosis not present

## 2016-04-03 DIAGNOSIS — Z681 Body mass index (BMI) 19 or less, adult: Secondary | ICD-10-CM

## 2016-04-03 DIAGNOSIS — Z85810 Personal history of malignant neoplasm of tongue: Secondary | ICD-10-CM | POA: Diagnosis not present

## 2016-04-03 DIAGNOSIS — Z8249 Family history of ischemic heart disease and other diseases of the circulatory system: Secondary | ICD-10-CM | POA: Diagnosis not present

## 2016-04-03 DIAGNOSIS — K219 Gastro-esophageal reflux disease without esophagitis: Secondary | ICD-10-CM | POA: Diagnosis present

## 2016-04-03 DIAGNOSIS — E785 Hyperlipidemia, unspecified: Secondary | ICD-10-CM | POA: Diagnosis present

## 2016-04-03 DIAGNOSIS — Z86718 Personal history of other venous thrombosis and embolism: Secondary | ICD-10-CM | POA: Diagnosis not present

## 2016-04-03 DIAGNOSIS — I2510 Atherosclerotic heart disease of native coronary artery without angina pectoris: Secondary | ICD-10-CM | POA: Diagnosis present

## 2016-04-03 DIAGNOSIS — Z9861 Coronary angioplasty status: Secondary | ICD-10-CM | POA: Diagnosis not present

## 2016-04-03 DIAGNOSIS — T380X5A Adverse effect of glucocorticoids and synthetic analogues, initial encounter: Secondary | ICD-10-CM | POA: Diagnosis present

## 2016-04-03 DIAGNOSIS — Z7902 Long term (current) use of antithrombotics/antiplatelets: Secondary | ICD-10-CM

## 2016-04-03 DIAGNOSIS — J181 Lobar pneumonia, unspecified organism: Secondary | ICD-10-CM | POA: Diagnosis not present

## 2016-04-03 DIAGNOSIS — Z923 Personal history of irradiation: Secondary | ICD-10-CM

## 2016-04-03 DIAGNOSIS — Z7951 Long term (current) use of inhaled steroids: Secondary | ICD-10-CM | POA: Diagnosis not present

## 2016-04-03 DIAGNOSIS — E1100 Type 2 diabetes mellitus with hyperosmolarity without nonketotic hyperglycemic-hyperosmolar coma (NKHHC): Secondary | ICD-10-CM | POA: Diagnosis not present

## 2016-04-03 DIAGNOSIS — Z7982 Long term (current) use of aspirin: Secondary | ICD-10-CM

## 2016-04-03 DIAGNOSIS — Z79899 Other long term (current) drug therapy: Secondary | ICD-10-CM

## 2016-04-03 LAB — CBC
HCT: 43.7 % (ref 36.0–46.0)
HEMOGLOBIN: 14.8 g/dL (ref 12.0–15.0)
MCH: 30.7 pg (ref 26.0–34.0)
MCHC: 33.9 g/dL (ref 30.0–36.0)
MCV: 90.7 fL (ref 78.0–100.0)
PLATELETS: 173 10*3/uL (ref 150–400)
RBC: 4.82 MIL/uL (ref 3.87–5.11)
RDW: 14.2 % (ref 11.5–15.5)
WBC: 7.3 10*3/uL (ref 4.0–10.5)

## 2016-04-03 LAB — COMPREHENSIVE METABOLIC PANEL
ALBUMIN: 3.7 g/dL (ref 3.5–5.0)
ALK PHOS: 115 U/L (ref 38–126)
ALT: 19 U/L (ref 14–54)
AST: 16 U/L (ref 15–41)
Anion gap: 12 (ref 5–15)
BUN: 17 mg/dL (ref 6–20)
CHLORIDE: 94 mmol/L — AB (ref 101–111)
CO2: 31 mmol/L (ref 22–32)
Calcium: 9.3 mg/dL (ref 8.9–10.3)
Creatinine, Ser: 0.93 mg/dL (ref 0.44–1.00)
GFR calc Af Amer: >60 mL/min (ref >60)
GFR calc non Af Amer: >60 mL/min (ref >60)
GLUCOSE: 220 mg/dL — AB (ref 65–99)
POTASSIUM: 4.1 mmol/L (ref 3.5–5.1)
SODIUM: 137 mmol/L (ref 135–145)
Total Bilirubin: 0.7 mg/dL (ref 0.3–1.2)
Total Protein: 6.8 g/dL (ref 6.5–8.1)

## 2016-04-03 LAB — GLUCOSE, CAPILLARY
GLUCOSE-CAPILLARY: 335 mg/dL — AB (ref 65–99)
Glucose-Capillary: 409 mg/dL — ABNORMAL HIGH (ref 65–99)

## 2016-04-03 LAB — MRSA PCR SCREENING: MRSA BY PCR: NEGATIVE

## 2016-04-03 LAB — CREATININE, SERUM: Creatinine, Ser: 0.74 mg/dL (ref 0.44–1.00)

## 2016-04-03 LAB — TROPONIN I: Troponin I: <0.03 ng/mL (ref <0.03)

## 2016-04-03 MED ORDER — GUAIFENESIN ER 600 MG PO TB12
1200.0000 mg | ORAL_TABLET | Freq: Two times a day (BID) | ORAL | Status: DC
  Administered 2016-04-03 – 2016-04-09 (×13): 1200 mg via ORAL
  Filled 2016-04-03 (×14): qty 2

## 2016-04-03 MED ORDER — METHYLPREDNISOLONE SODIUM SUCC 125 MG IJ SOLR
125.0000 mg | Freq: Once | INTRAMUSCULAR | Status: AC
  Administered 2016-04-03: 125 mg via INTRAVENOUS
  Filled 2016-04-03: qty 2

## 2016-04-03 MED ORDER — ENSURE ENLIVE PO LIQD
237.0000 mL | Freq: Two times a day (BID) | ORAL | Status: DC
  Administered 2016-04-03 – 2016-04-08 (×6): 237 mL via ORAL

## 2016-04-03 MED ORDER — PIPERACILLIN-TAZOBACTAM 3.375 G IVPB
3.3750 g | Freq: Three times a day (TID) | INTRAVENOUS | Status: DC
  Administered 2016-04-03 – 2016-04-05 (×6): 3.375 g via INTRAVENOUS
  Filled 2016-04-03 (×5): qty 50

## 2016-04-03 MED ORDER — INSULIN ASPART 100 UNIT/ML ~~LOC~~ SOLN
0.0000 [IU] | Freq: Three times a day (TID) | SUBCUTANEOUS | Status: DC
  Administered 2016-04-04 (×2): 15 [IU] via SUBCUTANEOUS
  Administered 2016-04-04: 20 [IU] via SUBCUTANEOUS
  Administered 2016-04-05: 11 [IU] via SUBCUTANEOUS
  Administered 2016-04-05: 7 [IU] via SUBCUTANEOUS
  Administered 2016-04-06: 15 [IU] via SUBCUTANEOUS
  Administered 2016-04-06: 4 [IU] via SUBCUTANEOUS
  Administered 2016-04-07: 7 [IU] via SUBCUTANEOUS
  Administered 2016-04-07: 11 [IU] via SUBCUTANEOUS
  Administered 2016-04-07: 20 [IU] via SUBCUTANEOUS
  Administered 2016-04-08: 15 [IU] via SUBCUTANEOUS
  Administered 2016-04-08 (×2): 3 [IU] via SUBCUTANEOUS
  Administered 2016-04-09 (×2): 4 [IU] via SUBCUTANEOUS

## 2016-04-03 MED ORDER — INSULIN ASPART 100 UNIT/ML ~~LOC~~ SOLN
0.0000 [IU] | Freq: Three times a day (TID) | SUBCUTANEOUS | Status: DC
Start: 2016-04-03 — End: 2016-04-03
  Administered 2016-04-03: 9 [IU] via SUBCUTANEOUS

## 2016-04-03 MED ORDER — INSULIN GLARGINE 100 UNIT/ML ~~LOC~~ SOLN
5.0000 [IU] | Freq: Once | SUBCUTANEOUS | Status: AC
  Administered 2016-04-03: 5 [IU] via SUBCUTANEOUS
  Filled 2016-04-03: qty 0.05

## 2016-04-03 MED ORDER — PIPERACILLIN-TAZOBACTAM 3.375 G IVPB 30 MIN
3.3750 g | Freq: Once | INTRAVENOUS | Status: AC
  Administered 2016-04-03: 3.375 g via INTRAVENOUS
  Filled 2016-04-03: qty 50

## 2016-04-03 MED ORDER — ROSUVASTATIN CALCIUM 20 MG PO TABS
20.0000 mg | ORAL_TABLET | Freq: Every day | ORAL | Status: DC
  Administered 2016-04-03 – 2016-04-08 (×5): 20 mg via ORAL
  Filled 2016-04-03 (×3): qty 1
  Filled 2016-04-03: qty 4
  Filled 2016-04-03: qty 1

## 2016-04-03 MED ORDER — INSULIN GLARGINE 100 UNIT/ML ~~LOC~~ SOLN
5.0000 [IU] | Freq: Every day | SUBCUTANEOUS | Status: DC
  Administered 2016-04-03: 5 [IU] via SUBCUTANEOUS
  Filled 2016-04-03: qty 0.05

## 2016-04-03 MED ORDER — IPRATROPIUM-ALBUTEROL 0.5-2.5 (3) MG/3ML IN SOLN
3.0000 mL | Freq: Four times a day (QID) | RESPIRATORY_TRACT | Status: DC
  Administered 2016-04-03 – 2016-04-09 (×24): 3 mL via RESPIRATORY_TRACT
  Filled 2016-04-03 (×23): qty 3

## 2016-04-03 MED ORDER — METHYLPREDNISOLONE SODIUM SUCC 125 MG IJ SOLR
60.0000 mg | Freq: Three times a day (TID) | INTRAMUSCULAR | Status: DC
Start: 2016-04-03 — End: 2016-04-04
  Administered 2016-04-03 – 2016-04-04 (×3): 60 mg via INTRAVENOUS
  Filled 2016-04-03 (×3): qty 2

## 2016-04-03 MED ORDER — INSULIN GLARGINE 100 UNIT/ML ~~LOC~~ SOLN
10.0000 [IU] | Freq: Every day | SUBCUTANEOUS | Status: DC
  Administered 2016-04-04: 10 [IU] via SUBCUTANEOUS
  Filled 2016-04-03: qty 0.1

## 2016-04-03 MED ORDER — ALBUTEROL SULFATE (2.5 MG/3ML) 0.083% IN NEBU
2.5000 mg | INHALATION_SOLUTION | RESPIRATORY_TRACT | Status: DC | PRN
  Administered 2016-04-08: 2.5 mg via RESPIRATORY_TRACT
  Filled 2016-04-03: qty 3

## 2016-04-03 MED ORDER — BUPROPION HCL ER (XL) 300 MG PO TB24
300.0000 mg | ORAL_TABLET | Freq: Every day | ORAL | Status: DC
  Administered 2016-04-03 – 2016-04-09 (×7): 300 mg via ORAL
  Filled 2016-04-03 (×7): qty 1

## 2016-04-03 MED ORDER — BUDESONIDE 0.5 MG/2ML IN SUSP
0.5000 mg | Freq: Two times a day (BID) | RESPIRATORY_TRACT | Status: DC
  Administered 2016-04-03 – 2016-04-09 (×12): 0.5 mg via RESPIRATORY_TRACT
  Filled 2016-04-03 (×12): qty 2

## 2016-04-03 MED ORDER — VANCOMYCIN HCL IN DEXTROSE 1-5 GM/200ML-% IV SOLN
1000.0000 mg | Freq: Once | INTRAVENOUS | Status: AC
  Administered 2016-04-03: 1000 mg via INTRAVENOUS
  Filled 2016-04-03: qty 200

## 2016-04-03 MED ORDER — HEPARIN SODIUM (PORCINE) 5000 UNIT/ML IJ SOLN
5000.0000 [IU] | Freq: Three times a day (TID) | INTRAMUSCULAR | Status: DC
  Administered 2016-04-03 – 2016-04-09 (×19): 5000 [IU] via SUBCUTANEOUS
  Filled 2016-04-03 (×19): qty 1

## 2016-04-03 MED ORDER — CLOPIDOGREL BISULFATE 75 MG PO TABS
75.0000 mg | ORAL_TABLET | Freq: Every day | ORAL | Status: DC
  Administered 2016-04-03 – 2016-04-09 (×7): 75 mg via ORAL
  Filled 2016-04-03 (×7): qty 1

## 2016-04-03 MED ORDER — ASPIRIN 81 MG PO CHEW
81.0000 mg | CHEWABLE_TABLET | Freq: Every day | ORAL | Status: DC
  Administered 2016-04-03 – 2016-04-09 (×7): 81 mg via ORAL
  Filled 2016-04-03 (×7): qty 1

## 2016-04-03 MED ORDER — VANCOMYCIN HCL IN DEXTROSE 750-5 MG/150ML-% IV SOLN
750.0000 mg | Freq: Two times a day (BID) | INTRAVENOUS | Status: DC
  Administered 2016-04-03 – 2016-04-04 (×2): 750 mg via INTRAVENOUS
  Filled 2016-04-03 (×2): qty 150

## 2016-04-03 NOTE — H&P (Signed)
Name: Brenda [name] MRN: [id number] DOB: 12/13/1950    ADMISSION DATE:  04/03/16  CHIEF COMPLAINT:  Dyspnea   BRIEF PATIENT DESCRIPTION: Brenda [name] is a 65 y.o. female active smoker with multiple medical problems including severe COPD (Simple spirometry FEV1 June 2016 0.67 L (27% predicted) with clear airflow obstruction), recurrent aspiration PNA, CAD, DM.  She was seen 10/25 for acute visit with AECOPD, had neg CXR at that time. She was prescribed levaquin and prednisone taper which she completed.  She returns today with same complaints.  States she did feel better for approx 1-2 days after starting steroids, abx but then felt worse.  Now with significant SOB, wheezing, unable to sleep, poor appetite, progressive weakness and occasional dizziness.  Has had subjective fevers, chills but was afebrile today. Denies syncope, chest pain, BLE edema, orthopnea, hemoptysis, n/v/d, recent sick contacts.     SIGNIFICANT EVENTS    STUDIES:  CXR 11/1>>>   HISTORY OF PRESENT ILLNESS: Brenda [name] is a 65 y.o. female active smoker with multiple medical problems including severe COPD (Simple spirometry FEV1 June 2016 0.67 L (27% predicted) with clear airflow obstruction), recurrent aspiration PNA, CAD, DM.  She was seen 10/25 for acute visit with AECOPD, had neg CXR at that time. She was prescribed levaquin and prednisone taper which she completed.  She returns today with same complaints.  States she did feel better for approx 1-2 days after starting steroids, abx but then felt worse.  Now with significant SOB, wheezing, unable to sleep, poor appetite, progressive weakness and occasional dizziness.  Denies syncope, chest pain, BLE edema, orthopnea, hemoptysis, n/v/d, fever, recent sick contacts.     PAST MEDICAL HISTORY :   has a past medical history of Arthritis; CAD S/P percutaneous coronary angioplasty (1999; 2001;2003, 10/2014); Cancer of base of tongue ([place]) (2010); Carotid artery occlusion;  Chronic bronchitis ([name]); COPD (chronic obstructive pulmonary disease) with emphysema ([place]); Depression; GERD (gastroesophageal reflux disease); H/O hiatal hernia; History of blood transfusion; History of DVT (deep vein thrombosis); radiation therapy; radiation therapy (01/16/09 - 03/06/09); Hypercholesteremia; Myocardial infarction (1999, 2003); PAD (peripheral artery disease) ([place]); Patent foramen ovale (February 2013); Pneumonia; Stroke [facility]); Transient ischemic attack (TIA) (February 2013); and Type II diabetes mellitus ([place]).  has a past surgical history that includes TEE without cardioversion (07/26/2011); Esophagogastroduodenoscopy (12/20/2011); Savory dilation (12/20/2011); Tonsillectomy (1969); Appendectomy (1962); Cardiac catheterization (1999; 10/2005); Coronary angioplasty with stent (1638--4665); Femoral bypass (1999; 2000); Carpal tunnel release (Right, 1980's?); Vaginal hysterectomy (1988); Cesarean section (1978); Tubal ligation (1988); Cataract extraction w/ intraocular lens implant (Right, 2011); Ankle fracture surgery (Right, 2007); Bypass graft popliteal to popliteal (Right, 02/05/2013); Eye surgery; Cardiac catheterization (N/A, 10/06/2015); Cardiac catheterization (N/A, 10/06/2015); Cardiac catheterization (N/A, 10/06/2015); transthoracic echocardiogram (March 2016); Cardiac catheterization (N/A, 12/01/2015); and Cardiac catheterization (Right, 12/22/2015). Prior to Admission medications   Medication Sig Start Date End Date Taking? Authorizing Provider  acetaminophen (TYLENOL) 500 MG tablet Take 1,000 mg by mouth 2 (two) times daily as needed for moderate pain or headache.     Historical Provider, MD  albuterol (PROVENTIL HFA;VENTOLIN HFA) 108 (90 BASE) MCG/ACT inhaler Inhale 2 puffs into the lungs every 6 (six) hours as needed for wheezing or shortness of breath. 01/20/15   Belkys A Regalado, MD  aspirin EC 81 MG tablet Take 1 tablet (81 mg total) by mouth daily. 10/07/15   Thurnell Lose, MD  buPROPion  (WELLBUTRIN XL) 300 MG 24 hr tablet Take 300 mg by mouth daily.  09/19/15   Historical Provider, MD  clopidogrel (PLAVIX) 75 MG tablet Take 1 tablet (75 mg total) by mouth daily. 10/07/15   Thurnell Lose, MD  docusate sodium (COLACE) 100 MG capsule Take 100 mg by mouth daily.    Historical Provider, MD  fish oil-omega-3 fatty acids 1000 MG capsule Take 1 g by mouth 2 (two) times daily.     Historical Provider, MD  fluticasone (FLONASE) 50 MCG/ACT nasal spray Place 2 sprays into both nostrils daily. 06/02/15   Debbe Odea, MD  fluticasone furoate-vilanterol (BREO ELLIPTA) 100-25 MCG/INH AEPB Inhale 1 puff into the lungs daily. 09/13/15   Tammy S Parrett, NP  GuaiFENesin (MUCINEX PO) Take by mouth.    Historical Provider, MD  insulin glargine (LANTUS) 100 UNIT/ML injection Inject into the skin every morning.    Historical Provider, MD  insulin lispro (HUMALOG) 100 UNIT/ML injection Inject 3 Units into the skin 3 (three) times daily with meals as needed for high blood sugar.     Historical Provider, MD  ipratropium-albuterol (DUONEB) 0.5-2.5 (3) MG/3ML SOLN Take 3 mLs by nebulization every 6 (six) hours. Patient taking differently: Take 3 mLs by nebulization 2 (two) times daily as needed (for wheezingt or shortness of breath).  06/02/15   Debbe Odea, MD  loratadine (CLARITIN) 10 MG tablet Take 1 tablet (10 mg total) by mouth daily. 06/02/15   Debbe Odea, MD  Multiple Vitamins-Minerals (MULTIVITAMIN & MINERAL PO) Take 1 tablet by mouth daily.    Historical Provider, MD  neomycin-bacitracin-polymyxin (NEOSPORIN) ointment Apply 1 application topically every 12 (twelve) hours. For open wound    Historical Provider, MD  nitroGLYCERIN (NITROLINGUAL) 0.4 MG/SPRAY spray Place 1 spray under the tongue every 5 (five) minutes as needed for chest pain. 01/20/15   Belkys A Regalado, MD  omeprazole (PRILOSEC) 20 MG capsule Take 20 mg by mouth daily.     Historical Provider, MD  rosuvastatin (CRESTOR) 20 MG tablet  Take 20 mg by mouth daily.    Historical Provider, MD  SPIRIVA RESPIMAT 2.5 MCG/ACT AERS 2 puffs daily. 10/24/15   Historical Provider, MD   Allergies  Allergen Reactions  . Tape Hives and Other (See Comments)    USE PAPER TAPE ONLY- Adhesive peels off skin-makes pt. Raw.  . Isosorbide Other (See Comments)    Drops BP too low  . Nicoderm [Nicotine] Rash    To the PATCH only, breakouts on skin    FAMILY HISTORY:  family history includes Cancer in her mother and sister; Cancer (age of onset: 78) in her father; Clotting disorder in her brother; Deep vein thrombosis in her brother; Diabetes in her brother, mother, sister, and son; Hearing loss in her brother; Heart attack in her brother and father; Heart disease in her father; Hyperlipidemia in her brother, mother, and son; Hypertension in her brother and mother; Other in her mother. SOCIAL HISTORY:  reports that she has been smoking Cigarettes.  She has a 50.00 pack-year smoking history. She has never used smokeless tobacco. She reports that she drinks about 3.0 oz of alcohol per week . She reports that she does not use drugs.  REVIEW OF SYSTEMS:   As per HPI - All other systems reviewed and were neg.    SUBJECTIVE:   VITAL SIGNS: Pulse Rate:  [99] 99 (11/01 0939) BP: (118)/(66) 118/66 (11/01 0939) SpO2:  [96 %] 96 % (11/01 0939) Weight:  [120 lb 3.2 oz (54.5 kg)] 120 lb 3.2 oz (54.5 kg) (11/01 0939)  PHYSICAL EXAMINATION: General - chronically ill appearing female, appears ill but No significant distress  ENT - No sinus tenderness, no oral exudate, no LAN Cardiac - s1s2 regular, no murmur Chest - resps even, mildly labored, significant expiratory wheeze throughout Back - No focal tenderness Abd - Soft, non-tender Ext - No edema Neuro - Normal strength Skin - No rashes Psych - normal mood, and behavior  No results for input(s): NA, K, CL, CO2, BUN, CREATININE, GLUCOSE in the last 168 hours. No results for input(s): HGB, HCT,  WBC, PLT in the last 168 hours. No results found.  ASSESSMENT / PLAN:  AECOPD -- failed outpt rx x 2.  Severe COPD  Hx Recurrent aspiration PNA   PLAN -  Admit to med[facility]surg bed at [facility]  IV steroids - solumedrol '125mg'$  IV now then '60mg'$  IV q8 IV abx  -- will do vanc, zosyn for now given subjective fever and outpt failure of levaquin and azithro but can likely narrow quickly Cbc, bmet, mg, phos, troponin, BNP Supplemental O2 as needed to keep sats 88-92%  Pulmonary hygiene  CXR r/o recurrent aspiration PNA  BD's - duonebs, budesonide, PRN albuterol  Smoking cessation   DM  PLAN -  Continue home lantus -- unsure dose, pharmacy to do med-rec SSI    Hx CAD  PLAN -  Cont home asa, statin Check BNP, troponin x 1   Nickolas Madrid, NP 04/03/2016  10:23 AM Pager: [phone] or [phone]

## 2016-04-03 NOTE — Progress Notes (Signed)
Pharmacy Antibiotic Note  Brenda [name] is a 65 y.o. female with severe COPD and dysphagia admitted on 04/03/2016 with HCAP. Seen in office last week and given Levaquin/Prednisone for AECOPD.  Pharmacy has been consulted for vancomycin and Zosyn dosing.  Plan:  Vancomycin 1000 mg IV now, then 750 mg IV q12 hr; goal trough 15-20 mcg/mL  Measure vancomycin trough levels at steady state as indicated  Zosyn 3.375 g IV given once over 30 minutes, then every 8 hrs by 4-hr infusion     Temp (24hrs), Avg:97.8 F (36.6 C), Min:97.8 F (36.6 C), Max:97.8 F (36.6 C)  No results for input(s): WBC, CREATININE, LATICACIDVEN, VANCOTROUGH, VANCOPEAK, VANCORANDOM, GENTTROUGH, GENTPEAK, GENTRANDOM, TOBRATROUGH, TOBRAPEAK, TOBRARND, AMIKACINPEAK, AMIKACINTROU, AMIKACIN in the last 168 hours.  CrCl cannot be calculated (Patient's most recent lab result is older than the maximum 21 days allowed.).     Allergies  Allergen Reactions  . Tape Hives and Other (See Comments)    USE PAPER TAPE ONLY- Adhesive peels off skin-makes pt. Raw.  . Isosorbide Other (See Comments)    Drops BP too low  . 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  Reuel Boom, PharmD, BCPS Pager: [phone] 04/03/2016, 11:08 AM

## 2016-04-03 NOTE — Progress Notes (Signed)
Chief Complaint  Patient presents with  . Acute Visit    Pt. breathing is worse, hard to walk because she feels more sob, Pt. c/o of dizziness, wheezing, fatigue, some chest discomfort, coughing, Pt. denies fever      Tests Simple spirometry FEV1 June 2016 0.67 L (27% predicted) with clear airflow obstruction  Past medical hx Past Medical History:  Diagnosis Date  . Arthritis    "hands" (11/26/2012)  . CAD S/P percutaneous coronary angioplasty 1999; 2001;2003, 10/2014   a) BMS- mCx; b) re-do PCI for prog of Dz (NIR BMS 3.5 x 12); c) staged PCI: p-mRCA Express II BMS 3.0x24 & 3.5 x 16 --> p-mLAD Cyper DES 3.0 x 13 & PTCA of D2 (2.5 balloon); 10/2015 PCI p-m LAD Promus DES 2.75 x 38 (covers pre&post-stent ISR), POBA of ostial rPDA  . Cancer of base of tongue ([place]) 2010   "& lymph nodes @ right neck" (11/26/2012  . Carotid artery occlusion   . Chronic bronchitis ([place])   . COPD (chronic obstructive pulmonary disease) with emphysema ([place])   . Depression   . GERD (gastroesophageal reflux disease)   . H/O hiatal hernia   . History of blood transfusion   . History of DVT (deep vein thrombosis)   . Hx of radiation therapy   . Hx of radiation therapy 01/16/09 - 03/06/09   base of tongue, right neck node  . Hypercholesteremia   . Myocardial infarction 1999, 2003  . PAD (peripheral artery disease) ([name])    status post bilateral femoropopliteal bypass grafting --> Dr. Sherren Mocha Early; -  02/2013 Revision of R Fem-AKPop to BK Pop.  . Patent foramen ovale February 2013   Small. Small PFO noted on TTE for TIA/CVA R-L + Bubble study with valsalva  . Pneumonia   . Stroke [facility])    left side weakness remains (11/26/2012)  . Transient ischemic attack (TIA) February 2013  . Type II diabetes mellitus ([name])      Past surgical hx, Allergies, Family hx, Social hx all reviewed.  Current Outpatient Prescriptions on File Prior to Visit  Medication Sig  . acetaminophen (TYLENOL) 500 MG tablet Take  1,000 mg by mouth 2 (two) times daily as needed for moderate pain or headache.   . albuterol (PROVENTIL HFA;VENTOLIN HFA) 108 (90 BASE) MCG/ACT inhaler Inhale 2 puffs into the lungs every 6 (six) hours as needed for wheezing or shortness of breath.  Marland Kitchen aspirin EC 81 MG tablet Take 1 tablet (81 mg total) by mouth daily.  Marland Kitchen buPROPion (WELLBUTRIN XL) 300 MG 24 hr tablet Take 300 mg by mouth daily.   . clopidogrel (PLAVIX) 75 MG tablet Take 1 tablet (75 mg total) by mouth daily.  Marland Kitchen docusate sodium (COLACE) 100 MG capsule Take 100 mg by mouth daily.  . fish oil-omega-3 fatty acids 1000 MG capsule Take 1 g by mouth 2 (two) times daily.   . fluticasone (FLONASE) 50 MCG/ACT nasal spray Place 2 sprays into both nostrils daily.  . fluticasone furoate-vilanterol (BREO ELLIPTA) 100-25 MCG/INH AEPB Inhale 1 puff into the lungs daily.  . insulin glargine (LANTUS) 100 UNIT/ML injection Inject into the skin every morning.  . insulin lispro (HUMALOG) 100 UNIT/ML injection Inject 3 Units into the skin 3 (three) times daily with meals as needed for high blood sugar.   . ipratropium-albuterol (DUONEB) 0.5-2.5 (3) MG/3ML SOLN Take 3 mLs by nebulization every 6 (six) hours. (Patient taking differently: Take 3 mLs by nebulization 2 (two) times daily  as needed (for wheezingt or shortness of breath). )  . loratadine (CLARITIN) 10 MG tablet Take 1 tablet (10 mg total) by mouth daily.  . Multiple Vitamins-Minerals (MULTIVITAMIN & MINERAL PO) Take 1 tablet by mouth daily.  Marland Kitchen neomycin-bacitracin-polymyxin (NEOSPORIN) ointment Apply 1 application topically every 12 (twelve) hours. For open wound  . nitroGLYCERIN (NITROLINGUAL) 0.4 MG/SPRAY spray Place 1 spray under the tongue every 5 (five) minutes as needed for chest pain.  Marland Kitchen omeprazole (PRILOSEC) 20 MG capsule Take 20 mg by mouth daily.   . rosuvastatin (CRESTOR) 20 MG tablet Take 20 mg by mouth daily.  Marland Kitchen SPIRIVA RESPIMAT 2.5 MCG/ACT AERS 2 puffs daily.   No current  facility-administered medications on file prior to visit.      Vital Signs BP 118/66 (BP Location: Right Arm, Patient Position: Sitting, Cuff Size: Normal)   Pulse 99   Ht '5\' 6"'$  (1.676 m)   Wt 120 lb 3.2 oz (54.5 kg)   SpO2 96%   BMI 19.40 kg/m   History of Present Illness Brenda [name] is a 65 y.o. female active smoker with multiple medical problems including severe COPD (Simple spirometry FEV1 June 2016 0.67 L (27% predicted) with clear airflow obstruction), recurrent aspiration PNA, CAD, DM.  She was seen 10/25 for acute visit with AECOPD, had neg CXR at that time. She was prescribed levaquin and prednisone taper which she completed.  She returns today with same complaints.  States she did feel better for approx 1-2 days after starting steroids, abx but then felt worse.  Now with significant SOB, wheezing, unable to sleep, poor appetite, progressive weakness and occasional dizziness.  Denies syncope, chest pain, BLE edema, orthopnea, hemoptysis, n/v/d, fever, recent sick contacts.     Physical Exam  General - chronically ill appearing female, appears ill but No significant distress  ENT - No sinus tenderness, no oral exudate, no LAN Cardiac - s1s2 regular, no murmur Chest - resps even, mildly labored, significant expiratory wheeze throughout Back - No focal tenderness Abd - Soft, non-tender Ext - No edema Neuro - Normal strength Skin - No rashes Psych - normal mood, and behavior   Assessment/Plan  AECOPD -- failed outpt rx. Significant dyspnea and bronchospasm.  Severe COPD  Hx Recurrent aspiration PNA   PLAN -  Admit to med[facility]surg bed at [facility]  IV steroids  IV abx  CXR r/o recurrent aspiration PNA  BD's - duonebs, budesonide, PRN albuterol  Smoking cessation     Nickolas Madrid, NP 04/03/2016  9:59 AM

## 2016-04-04 DIAGNOSIS — E1100 Type 2 diabetes mellitus with hyperosmolarity without nonketotic hyperglycemic-hyperosmolar coma (NKHHC): Secondary | ICD-10-CM

## 2016-04-04 DIAGNOSIS — R739 Hyperglycemia, unspecified: Secondary | ICD-10-CM

## 2016-04-04 LAB — GLUCOSE, CAPILLARY
GLUCOSE-CAPILLARY: 323 mg/dL — AB (ref 65–99)
GLUCOSE-CAPILLARY: 318 mg/dL — AB (ref 65–99)
Glucose-Capillary: 396 mg/dL — ABNORMAL HIGH (ref 65–99)
Glucose-Capillary: 249 mg/dL — ABNORMAL HIGH (ref 65–99)

## 2016-04-04 MED ORDER — INSULIN GLARGINE 100 UNIT/ML ~~LOC~~ SOLN
30.0000 [IU] | Freq: Every day | SUBCUTANEOUS | Status: DC
  Administered 2016-04-05: 30 [IU] via SUBCUTANEOUS
  Filled 2016-04-04: qty 0.3

## 2016-04-04 MED ORDER — METHYLPREDNISOLONE SODIUM SUCC 40 MG IJ SOLR
40.0000 mg | Freq: Two times a day (BID) | INTRAMUSCULAR | Status: DC
  Administered 2016-04-04 – 2016-04-05 (×2): 40 mg via INTRAVENOUS
  Filled 2016-04-04 (×2): qty 1

## 2016-04-04 MED ORDER — PREMIER PROTEIN SHAKE
11.0000 [oz_av] | Freq: Two times a day (BID) | ORAL | Status: DC
  Administered 2016-04-04 – 2016-04-09 (×3): 11 [oz_av] via ORAL
  Filled 2016-04-04 (×2): qty 325.31

## 2016-04-04 MED ORDER — INSULIN GLARGINE 100 UNIT/ML ~~LOC~~ SOLN
20.0000 [IU] | Freq: Once | SUBCUTANEOUS | Status: AC
  Administered 2016-04-04: 20 [IU] via SUBCUTANEOUS
  Filled 2016-04-04: qty 0.2

## 2016-04-04 NOTE — Progress Notes (Signed)
Pharmacy Antibiotic Note  Brenda [name] is a 65 y.o. female with severe COPD and dysphagia admitted on 04/03/2016 with HCAP. Seen in office last week and given Levaquin/Prednisone for AECOPD.  Pharmacy has been consulted for vancomycin and Zosyn dosing.  Plan:  Continue Zosyn 3.375 g IV every 8 hrs by 4-hr infusion  Renal function stable wnl; expect narrowing soon; will sign off consult  Height: '5\' 6"'$  (167.6 cm) Weight: 120 lb (54.4 kg) IBW/kg (Calculated) : 59.3  Temp (24hrs), Avg:98.5 F (36.9 C), Min:98.3 F (36.8 C), Max:98.7 F (37.1 C)   Recent Labs Lab 04/03/16 1136  WBC 7.3  CREATININE 0.93  0.74    Estimated Creatinine Clearance: 60.2 mL/min (by C-G formula based on SCr of 0.74 mg/dL).     Allergies  Allergen Reactions  . Tape Hives and Other (See Comments)    USE PAPER TAPE ONLY- Adhesive peels off skin-makes pt. Raw.  . Isosorbide Other (See Comments)    Drops BP too low  . 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  Reuel Boom, PharmD, BCPS Pager: [phone] 04/04/2016, 2:28 PM

## 2016-04-04 NOTE — Progress Notes (Signed)
Initial Nutrition Assessment  DOCUMENTATION CODES:   Severe malnutrition in context of chronic illness  INTERVENTION:  Provide Premier Protein po BID, each supplement provides 160 kcal and 30 grams protein. Patient may require coverage of Premier shake with insulin.  Can also provide Boost Breeze when CBG more under control. Likely worsened by methylprednisolone.   Encouraged adequate intake of protein and calories with meals. Reviewed options patient will enjoy.  NUTRITION DIAGNOSIS:   Increased nutrient needs related to catabolic illness (COPD exacerbation) as evidenced by estimated needs.  GOAL:   Patient will meet greater than or equal to 90% of their needs  MONITOR:   PO intake, Supplement acceptance, Labs, Weight trends, I & O's  REASON FOR ASSESSMENT:   Malnutrition Screening Tool    ASSESSMENT:   65 y.o.femaleactive smoker with multiple medical problems including severe COPD (Simple spirometry FEV1 June 2016 0.67 L (27% predicted) with clear airflow obstruction), recurrent aspiration PNA, CAD, DM. Now with significant SOB, wheezing, unable to sleep, poor appetite, progressive weakness and occasional dizziness, found to have COPD exacerbation.   Spoke with patient and husband at bedside. She reports her appetite has been poor for the last 10 years due to dry mouth and taste changes after she had tongue cancer. The past week it has been even worse than normal due to weakness and shortness of breath. She has only been able to tolerate 1/2 bowl of soup per day this past week, whereas normally she would have 2 meals per day. Also endorsing constipation. She reports that since she has been here her appetite is improving. She has been finishing most of her meals. Patient does not like many protein options including meat, milk, yogurt, or peanut butter. She reports she cannot have them daily.  Reports UBW one year ago was 139 lbs. Most recently she has been fluctuating between  122-127 lbs. Patient has had 19 lbs weight loss (14% body weight) over the past year, which is insignificant for time frame. In the past week she has lost 5 lbs (4% body weight), which is significant for time frame.   She has tried Ensure and Boost in the past and does not really like them since they are milk-based. She is amenable to trying [employer] and [employer].  Meal Completion: 90-100% of regular diet per chart  Medications reviewed and include: Novolog sliding scale TID with meals (has received 35 units today), Lantus 30 units daily, methylprednisolone 40 mg Q12hrs.  Labs reviewed: CBG 323-409 past 24hrs, Chloride 94, Glucose 220, HgbA1c 8.9 on 10/06/2015.  Nutrition-Focused physical exam completed. Findings are severe fat depletion, severe muscle depletion, and no edema.   Discussed with RN.   Diet Order:  Diet regular Room service appropriate? Yes; Fluid consistency: Thin  Skin:  Reviewed, no issues  Last BM:  03/25/2016  Height:   Ht Readings from Last 1 Encounters:  04/03/16 '5\' 6"'$  (1.676 m)    Weight:   Wt Readings from Last 1 Encounters:  04/03/16 120 lb (54.4 kg)    Ideal Body Weight:  59.09 kg  BMI:  Body mass index is 19.37 kg/m.  Estimated Nutritional Needs:   Kcal:  1445-1670 (MSJ x 1.3-1.5)  Protein:  80-92 grams (1.5-1.7 grams/kg)  Fluid:  >/= 1.3 L/day (25 ml/kg)  EDUCATION NEEDS:   Education needs addressed  Willey Blade, MS, RD, LDN Pager: [phone] After Hours Pager: [phone]

## 2016-04-04 NOTE — Progress Notes (Signed)
   Name: Brenda [name] MRN: [id number] DOB: 22-Mar-1951    ADMISSION DATE:  04/03/16  CHIEF COMPLAINT:  Dyspnea   BRIEF PATIENT DESCRIPTION: Brenda [name] is a 65 y.o. female active smoker with multiple medical problems including severe COPD (Simple spirometry FEV1 June 2016 0.67 L (27% predicted) with clear airflow obstruction), recurrent aspiration PNA, CAD, DM.  She was admitted on 11/1 for acute on chronic respiratory failure with hypoxemia because she had severe dyspnea despite outpatient oral steroids and antibiotics.  SIGNIFICANT EVENTS   STUDIES:  CXR 11/1>>> no infiltrate   SUBJECTIVE:   Feels a little better today, hasn't been out of bed, hasn't walked  VITAL SIGNS: Temp:  [98.3 F (36.8 C)-98.7 F (37.1 C)] 98.7 F (37.1 C) (11/02 0548) Pulse Rate:  [78-97] 78 (11/02 0548) Resp:  [17-18] 17 (11/02 0548) BP: (100-102)/(59-62) 100/59 (11/02 0548) SpO2:  [83 %-100 %] 94 % (11/02 0847) Weight:  [120 lb (54.4 kg)] 120 lb (54.4 kg) (11/01 1600)  3L [place]  PHYSICAL EXAMINATION: General - resting comfortably HENT - NCAT, OP clear PULM - poor air movement, wheezing bilaterally CV - RRR, no mgr GI - belly soft, nontender MSK - normal bulk and tone, no bony abnormalities Neuro - awake, alert, no distress   Recent Labs Lab 04/03/16 1136  NA 137  K 4.1  CL 94*  CO2 31  BUN 17  CREATININE 0.93  0.74  GLUCOSE 220*    Recent Labs Lab 04/03/16 1136  HGB 14.8  HCT 43.7  WBC 7.3  PLT 173   X-ray Chest Pa And Lateral  Result Date: 04/03/2016 CLINICAL DATA:  One month of cough. History of previous pneumonia. The patient complains of shortness of breath and pain from the left breast to the left axillary region. History of COPD and previous MI EXAM: CHEST  2 VIEW COMPARISON:  PA and lateral chest x-ray of March 27, 2016 FINDINGS: The lungs remain hyperinflated with hemidiaphragm flattening. The interstitial markings remain mildly increased diffusely. There is no  alveolar infiltrate, pneumothorax, or pneumomediastinum. There is no pleural effusion. The heart and pulmonary vascularity are normal. There is calcification in the wall of the thoracic aorta. The bony thorax exhibits no acute abnormalities. IMPRESSION: COPD. Chronic interstitial prominence of both lungs. No CHF nor pneumonia. Aortic atherosclerosis. Electronically Signed   By: David  Martinique M.D.   On: 04/03/2016 11:47    ASSESSMENT / PLAN:  Acute Exacerbation of Severe COPD -- failed outpt rx x 2 oral antibiotics and oral steroids Severe COPD  Acute on chronic respiratory failure with hypoxemia (had tripoding, accessory muscle use with transfer to bed 11/1) Hx Recurrent aspiration PNA   PLAN -  Continue IV steroids - wean solumedrol today to '40mg'$  IV q12h given  IV antibiotics - stop vanc, continue zosyn, likely change to oral levaquin on 11/3 Supplemental O2 as needed to keep sats 88-92%  BD's - duonebs, budesonide, PRN albuterol  Smoking cessation encouraged Out of bed today  DM 2 with severe hyperglycemia from IV steroids PLAN -  Increase lantus to 30 Units now SSI > useresistant scale   Hx CAD  PLAN -  Cont home asa, statin  Out of bed today  Still significantly wheezing with poor air movement on 11/2, but given improvement suspect likely d/c home 11/3  Roselie Awkward, MD [place] [facility] Pager: [phone] Cell: [phone] After 3pm or if no response, call [phone]

## 2016-04-05 DIAGNOSIS — E43 Unspecified severe protein-calorie malnutrition: Secondary | ICD-10-CM

## 2016-04-05 LAB — GLUCOSE, CAPILLARY
GLUCOSE-CAPILLARY: 154 mg/dL — AB (ref 65–99)
GLUCOSE-CAPILLARY: 253 mg/dL — AB (ref 65–99)
GLUCOSE-CAPILLARY: 204 mg/dL — AB (ref 65–99)
GLUCOSE-CAPILLARY: 242 mg/dL — AB (ref 65–99)
Glucose-Capillary: 78 mg/dL (ref 65–99)

## 2016-04-05 MED ORDER — PREDNISONE 20 MG PO TABS: 40.0000 mg | ORAL_TABLET | Freq: Every day | ORAL | Status: DC

## 2016-04-05 MED ORDER — PREDNISONE 20 MG PO TABS
20.0000 mg | ORAL_TABLET | Freq: Every day | ORAL | Status: DC
Start: 2016-04-14 — End: 2016-04-09

## 2016-04-05 MED ORDER — PREDNISONE 10 MG PO TABS: 30.0000 mg | ORAL_TABLET | Freq: Every day | ORAL | Status: DC

## 2016-04-05 MED ORDER — PREDNISONE 10 MG PO TABS: ORAL_TABLET | ORAL | 0 refills | Status: DC

## 2016-04-05 MED ORDER — LEVOFLOXACIN 500 MG PO TABS
500.0000 mg | ORAL_TABLET | Freq: Every day | ORAL | Status: DC
  Administered 2016-04-05 – 2016-04-09 (×5): 500 mg via ORAL
  Filled 2016-04-05 (×6): qty 1

## 2016-04-05 MED ORDER — PREDNISONE 10 MG PO TABS: 10.0000 mg | ORAL_TABLET | Freq: Every day | ORAL | Status: DC

## 2016-04-05 MED ORDER — PREDNISONE 20 MG PO TABS
40.0000 mg | ORAL_TABLET | Freq: Every day | ORAL | Status: AC
Start: 2016-04-06 — End: 2016-04-09
  Administered 2016-04-06 – 2016-04-09 (×4): 40 mg via ORAL
  Filled 2016-04-05 (×4): qty 2

## 2016-04-05 MED ORDER — INSULIN ASPART 100 UNIT/ML ~~LOC~~ SOLN: 0.0000 [IU] | Freq: Three times a day (TID) | SUBCUTANEOUS | 11 refills | Status: DC

## 2016-04-05 MED ORDER — INSULIN GLARGINE 100 UNIT/ML ~~LOC~~ SOLN
20.0000 [IU] | Freq: Every day | SUBCUTANEOUS | Status: DC
  Administered 2016-04-06 – 2016-04-09 (×4): 20 [IU] via SUBCUTANEOUS
  Filled 2016-04-05 (×4): qty 0.2

## 2016-04-05 MED ORDER — LEVOFLOXACIN 500 MG PO TABS: 500.0000 mg | ORAL_TABLET | Freq: Every day | ORAL | 0 refills | Status: DC

## 2016-04-05 NOTE — Progress Notes (Signed)
Hypoglycemic Event  CBG: 70  Treatment: 120 ml OJ,   Symptoms: lightheadedness, weakness  Follow-up CBG: Time: CBG Result:  Possible Reasons for Event: low blood sugar  Comments/MD notified:    Makelle Marrone W Jaimya Feliciano

## 2016-04-05 NOTE — Progress Notes (Signed)
Inpatient Diabetes Program Recommendations  AACE/ADA: New Consensus Statement on Inpatient Glycemic Control (2015)  Target Ranges:  Prepandial:   less than 140 mg/dL      Peak postprandial:   less than 180 mg/dL (1-2 hours)      Critically ill patients:  140 - 180 mg/dL   Lab Results  Component Value Date   GLUCAP 253 (H) 04/05/2016   HGBA1C 8.9 (H) 10/06/2015  Results for JENNAVECIA, SCHWIER (MRN [id number]) as of 04/05/2016 14:13  Ref. Range 04/03/2016 21:17 04/04/2016 07:23 04/04/2016 11:57 04/04/2016 17:12 04/04/2016 20:59 04/05/2016 07:38 04/05/2016 08:24 04/05/2016 11:30  Glucose-Capillary Latest Ref Range: 65 - 99 mg/dL 335 (H) 323 (H) 396 (H) 318 (H) 249 (H) 78 154 (H) 253 (H)    Review of Glycemic Control  Post-prandial blood sugars elevated in the presence of steroids. Needs meal coverage insulin.  Inpatient Diabetes Program Recommendations:    Add Novolog 4 units tidwc for meal coverage insulin. Add CHO mod med to diet.   Will follow. Thank you. Lorenda Peck, RD, LDN, CDE Inpatient Diabetes Coordinator [phone]

## 2016-04-05 NOTE — Progress Notes (Signed)
Name: Brenda [name] MRN: [id number] DOB: [birth year]    ADMISSION DATE:  04/03/16  CHIEF COMPLAINT:  Dyspnea   BRIEF PATIENT DESCRIPTION: Brenda [name] is a 65 y.o. female active smoker with multiple medical problems including severe COPD (Simple spirometry FEV1 June 2016 0.67 L (27% predicted) with clear airflow obstruction), recurrent aspiration PNA, CAD, DM.  She was admitted on 11/1 for acute on chronic respiratory failure with hypoxemia because she had severe dyspnea despite outpatient oral steroids and antibiotics.  SIGNIFICANT EVENTS   STUDIES:  CXR 11/1>>> no infiltrate   SUBJECTIVE:   Walked and almost passed out. Reports not feeling well. "I will go home if you want me to but I'm weak".  VITAL SIGNS: Temp:  [98.3 F (36.8 C)-98.6 F (37 C)] 98.6 F (37 C) (11/03 0612) Pulse Rate:  [95-99] 95 (11/03 0612) Resp:  [18] 18 (11/03 0612) BP: (106-125)/(60-62) 106/60 (11/03 0612) SpO2:  [92 %-99 %] 93 % (11/03 0748)    PHYSICAL EXAMINATION: General - Increased wob and coughing HENT - NCAT, OP clear PULM - poor air movement, wheezing bilaterally CV - RRR, no mgr GI - belly soft, nontender MSK - normal bulk and tone, no bony abnormalities Neuro - awake, alert, no distress   Recent Labs Lab 04/03/16 1136  NA 137  K 4.1  CL 94*  CO2 31  BUN 17  CREATININE 0.93  0.74  GLUCOSE 220*    Recent Labs Lab 04/03/16 1136  HGB 14.8  HCT 43.7  WBC 7.3  PLT 173   X-ray Chest Pa And Lateral  Result Date: 04/03/2016 CLINICAL DATA:  One month of cough. History of previous pneumonia. The patient complains of shortness of breath and pain from the left breast to the left axillary region. History of COPD and previous MI EXAM: CHEST  2 VIEW COMPARISON:  PA and lateral chest x-ray of March 27, 2016 FINDINGS: The lungs remain hyperinflated with hemidiaphragm flattening. The interstitial markings remain mildly increased diffusely. There is no alveolar infiltrate,  pneumothorax, or pneumomediastinum. There is no pleural effusion. The heart and pulmonary vascularity are normal. There is calcification in the wall of the thoracic aorta. The bony thorax exhibits no acute abnormalities. IMPRESSION: COPD. Chronic interstitial prominence of both lungs. No CHF nor pneumonia. Aortic atherosclerosis. Electronically Signed   By: David  Martinique M.D.   On: 04/03/2016 11:47    ASSESSMENT / PLAN:  Acute Exacerbation of Severe COPD -- failed outpt rx x 2 oral antibiotics and oral steroids Severe COPD  Acute on chronic respiratory failure with hypoxemia (had tripoding, accessory muscle use with transfer to bed 11/1) Hx Recurrent aspiration PNA   PLAN -  Change to po steroids -  IV antibiotics - stop vanc, &zosyn, change to oral levaquin on 11/3 Supplemental O2 as needed to keep sats 88-92%  BD's - duonebs, budesonide, PRN albuterol  Smoking cessation encouraged Out of bed as tolerated  DM 2 with severe hyperglycemia from IV steroids CBG (last 3)   Recent Labs  04/04/16 2059 04/05/16 0738 04/05/16 0824  GLUCAP 249* 78 154*     PLAN -   lantus to 30 Units now. Will need adjustment prior to dc SSI > useresistant scale Decrease steroids and change to PO taper.   Hx CAD  PLAN -  Cont home asa, statin  Out of bed today  Still significantly wheezing with poor air movement on 11/3, reports "I can barely walk". Will tee up for dc maybe on  11/4Richardson Landry Trung Wenzl ACNP [facility] Pager [phone] till 3 pm If no answer page [phone] 04/05/2016, 10:35 AM

## 2016-04-06 LAB — GLUCOSE, CAPILLARY
GLUCOSE-CAPILLARY: 92 mg/dL (ref 65–99)
GLUCOSE-CAPILLARY: 192 mg/dL — AB (ref 65–99)
Glucose-Capillary: 339 mg/dL — ABNORMAL HIGH (ref 65–99)
Glucose-Capillary: 260 mg/dL — ABNORMAL HIGH (ref 65–99)

## 2016-04-06 NOTE — Progress Notes (Signed)
   Name: Brenda [name] MRN: [id number] DOB: [birth year]    ADMISSION DATE:  04/03/16  CHIEF COMPLAINT:  Dyspnea   BRIEF PATIENT DESCRIPTION: Brenda [name] is a 65 y.o. female active smoker with multiple medical problems including severe COPD (Simple spirometry FEV1 June 2016 0.67 L (27% predicted) with clear airflow obstruction), recurrent aspiration PNA, CAD, DM.  She was admitted on 11/1 for acute on chronic respiratory failure with hypoxemia because she had severe dyspnea despite outpatient oral steroids and antibiotics.  SIGNIFICANT EVENTS   STUDIES:  CXR 11/1>>> no infiltrate   SUBJECTIVE:   Better than yesterday, but feels very dry. Cough only occasionally productive  VITAL SIGNS: Temp:  [98.4 F (36.9 C)-99 F (37.2 C)] 98.7 F (37.1 C) (11/04 0446) Pulse Rate:  [95-100] 95 (11/04 0446) Resp:  [18-24] 18 (11/04 0446) BP: (109-125)/(60-77) 124/77 (11/04 0446) SpO2:  [95 %-98 %] 95 % (11/04 0446)    PHYSICAL EXAMINATION: General - Appropriate, oriented, lying in bed HENT - NCAT, OP clear PULM - poor air movement, wheezing bilaterally, sustained deep cough. Room air O2 sat 72% this AM at rest after she walked to bathroom and forgot to put O2 back on. CV - RRR, no mgr GI - abd soft, nontender MSK - normal bulk and tone, no bony abnormalities Neuro - awake, alert, no distress   Recent Labs Lab 04/03/16 1136  NA 137  K 4.1  CL 94*  CO2 31  BUN 17  CREATININE 0.93  0.74  GLUCOSE 220*    Recent Labs Lab 04/03/16 1136  HGB 14.8  HCT 43.7  WBC 7.3  PLT 173   No results found.  ASSESSMENT / PLAN:  Acute Exacerbation of Severe COPD -- failed outpt rx x 2 oral antibiotics and oral steroids Severe COPD  Acute on chronic respiratory failure with hypoxemia (had tripoding, accessory muscle use with transfer to bed 11/1) Hx Recurrent aspiration PNA   PLAN -  Changed to po steroids -  IV antibiotics - stop vanc, &zosyn, changed to oral levaquin on  11/3 Supplemental O2 as needed to keep sats 88-92%  BD's - duonebs, budesonide, PRN albuterol  Smoking cessation encouraged Out of bed as tolerated Encourage hydration  DM 2 with severe hyperglycemia from IV steroids CBG (last 3)   Recent Labs  04/05/16 1654 04/05/16 2150 04/06/16 0743  GLUCAP 204* 242* 92     PLAN -  . Will need lantus adjustment prior to dc SSI > use esistant scale Decrease steroids and change to PO taper.   Hx CAD  PLAN -  Cont home asa, statin  Out of bed today  Still significantly wheezing with poor air movement on 11/4. Consider dc maybe on 11/6  CD Brenda Evans, MD [name] Pager [phone] till 3 pm If no answer page [phone] 04/06/2016, 9:08 AM

## 2016-04-07 LAB — GLUCOSE, CAPILLARY
GLUCOSE-CAPILLARY: 246 mg/dL — AB (ref 65–99)
Glucose-Capillary: 298 mg/dL — ABNORMAL HIGH (ref 65–99)
Glucose-Capillary: 451 mg/dL — ABNORMAL HIGH (ref 65–99)
Glucose-Capillary: 386 mg/dL — ABNORMAL HIGH (ref 65–99)
Glucose-Capillary: 335 mg/dL — ABNORMAL HIGH (ref 65–99)

## 2016-04-07 MED ORDER — INSULIN ASPART 100 UNIT/ML ~~LOC~~ SOLN
12.0000 [IU] | Freq: Once | SUBCUTANEOUS | Status: AC
  Administered 2016-04-07: 12 [IU] via SUBCUTANEOUS

## 2016-04-07 NOTE — Progress Notes (Signed)
Pt CBG 451 covered with 20 units Novolog Insulin, CBG rechecked 1 hour later 386. On call MD paged. RN told to recheck CBG 2100 if greater than 300 page on call again to receive further orders.  Kizzie Ide, RN

## 2016-04-07 NOTE — Progress Notes (Signed)
eLink Physician-Brief Progress Note Patient Name: JOURNE HALLMARK DOB: 11-Nov-1950 MRN: [id number]   Date of Service  04/07/2016  HPI/Events of Note  Hyperglycemia - Blood glucose = 335.  eICU Interventions  Will give an additional dose of Novolog 12 units SQ now.         Sommer,Steven Cornelia Copa 04/07/2016, 9:35 PM

## 2016-04-07 NOTE — Progress Notes (Signed)
   Name: Brenda [name] MRN: [id number] DOB: [birth year]    ADMISSION DATE:  04/03/16  CHIEF COMPLAINT:  Dyspnea   BRIEF PATIENT DESCRIPTION: Brenda [name] is a 65 y.o. female active smoker with multiple medical problems including severe COPD (Simple spirometry FEV1 June 2016 0.67 L (27% predicted) with clear airflow obstruction), recurrent aspiration PNA, CAD, DM.  She was admitted on 11/1 for acute on chronic respiratory failure with hypoxemia because she had severe dyspnea despite outpatient oral steroids and antibiotics.  SIGNIFICANT EVENTS   STUDIES:  CXR 11/1>>> no infiltrate   SUBJECTIVE:   Continues deep cough with movement that she says is normal for her. Denies acute needs.  VITAL SIGNS: Temp:  [97.9 F (36.6 C)-98.5 F (36.9 C)] 97.9 F (36.6 C) (11/05 0606) Pulse Rate:  [85-106] 85 (11/05 0606) Resp:  [19-20] 20 (11/05 0606) BP: (106-144)/(67-87) 106/67 (11/05 0606) SpO2:  [92 %-97 %] 92 % (11/05 0747)    PHYSICAL EXAMINATION: General - Appropriate, oriented, lying in bed HENT - NCAT, OP clear PULM - poor air movement, wheezing not heard, deep cough. Sat 92% on O2 CV - RRR, no mgr GI - abd soft, nontender MSK - normal bulk and tone, no bony abnormalities Neuro - awake, alert, no distress   Recent Labs Lab 04/03/16 1136  NA 137  K 4.1  CL 94*  CO2 31  BUN 17  CREATININE 0.93  0.74  GLUCOSE 220*    Recent Labs Lab 04/03/16 1136  HGB 14.8  HCT 43.7  WBC 7.3  PLT 173   No results found.  ASSESSMENT / PLAN:  Acute Exacerbation of Severe COPD -- failed outpt rx x 2 oral antibiotics and oral steroids Severe COPD  Acute on chronic respiratory failure with hypoxemia (had tripoding, accessory muscle use with transfer to bed 11/1) Hx Recurrent aspiration PNA   PLAN -  Changed to po steroids - tapering  vanc, &zosyn changed to oral levaquin on 11/3 Supplemental O2 as needed to keep sats 88-92%  BD's - duonebs, budesonide, PRN albuterol    Smoking cessation encouraged Out of bed as tolerated Encourage hydration  DM 2 with severe hyperglycemia from IV steroids CBG (last 3)   Recent Labs  04/06/16 1646 04/06/16 2139 04/07/16 0729  GLUCAP 339* 260* 298*     PLAN -   Will need lantus adjustment prior to dc SSI > use esistant scale Steroids PO taper. Emphasize smoking cessation  Hx CAD  PLAN -  Cont home asa, statin  Out of bed today  Still significant cough with poor air movement on 11/4. Consider dc maybe on 11/6  CD Marcella Charlson, MD [facility] Pager [phone] till 3 pm If no answer page [phone] 04/07/2016, 10:32 AM

## 2016-04-08 ENCOUNTER — Inpatient Hospital Stay (HOSPITAL_COMMUNITY): Payer: Medicare Other

## 2016-04-08 ENCOUNTER — Encounter (HOSPITAL_BASED_OUTPATIENT_CLINIC_OR_DEPARTMENT_OTHER): Payer: Medicare Other | Attending: Internal Medicine

## 2016-04-08 DIAGNOSIS — I2510 Atherosclerotic heart disease of native coronary artery without angina pectoris: Secondary | ICD-10-CM | POA: Insufficient documentation

## 2016-04-08 DIAGNOSIS — E11622 Type 2 diabetes mellitus with other skin ulcer: Secondary | ICD-10-CM | POA: Insufficient documentation

## 2016-04-08 DIAGNOSIS — L97211 Non-pressure chronic ulcer of right calf limited to breakdown of skin: Secondary | ICD-10-CM | POA: Insufficient documentation

## 2016-04-08 DIAGNOSIS — Z8673 Personal history of transient ischemic attack (TIA), and cerebral infarction without residual deficits: Secondary | ICD-10-CM | POA: Insufficient documentation

## 2016-04-08 DIAGNOSIS — F17210 Nicotine dependence, cigarettes, uncomplicated: Secondary | ICD-10-CM | POA: Insufficient documentation

## 2016-04-08 DIAGNOSIS — E1151 Type 2 diabetes mellitus with diabetic peripheral angiopathy without gangrene: Secondary | ICD-10-CM | POA: Insufficient documentation

## 2016-04-08 DIAGNOSIS — I70232 Atherosclerosis of native arteries of right leg with ulceration of calf: Secondary | ICD-10-CM | POA: Insufficient documentation

## 2016-04-08 DIAGNOSIS — R079 Chest pain, unspecified: Secondary | ICD-10-CM

## 2016-04-08 DIAGNOSIS — I252 Old myocardial infarction: Secondary | ICD-10-CM | POA: Insufficient documentation

## 2016-04-08 DIAGNOSIS — Z9861 Coronary angioplasty status: Secondary | ICD-10-CM | POA: Insufficient documentation

## 2016-04-08 DIAGNOSIS — Z9221 Personal history of antineoplastic chemotherapy: Secondary | ICD-10-CM | POA: Insufficient documentation

## 2016-04-08 DIAGNOSIS — J449 Chronic obstructive pulmonary disease, unspecified: Secondary | ICD-10-CM | POA: Insufficient documentation

## 2016-04-08 DIAGNOSIS — Z95820 Peripheral vascular angioplasty status with implants and grafts: Secondary | ICD-10-CM | POA: Insufficient documentation

## 2016-04-08 DIAGNOSIS — Z85810 Personal history of malignant neoplasm of tongue: Secondary | ICD-10-CM | POA: Insufficient documentation

## 2016-04-08 LAB — GLUCOSE, CAPILLARY
GLUCOSE-CAPILLARY: 150 mg/dL — AB (ref 65–99)
GLUCOSE-CAPILLARY: 146 mg/dL — AB (ref 65–99)
GLUCOSE-CAPILLARY: 358 mg/dL — AB (ref 65–99)
Glucose-Capillary: 316 mg/dL — ABNORMAL HIGH (ref 65–99)

## 2016-04-08 LAB — TROPONIN I
Troponin I: <0.03 ng/mL (ref <0.03)
Troponin I: <0.03 ng/mL (ref <0.03)

## 2016-04-08 MED ORDER — INSULIN ASPART 100 UNIT/ML ~~LOC~~ SOLN
6.0000 [IU] | Freq: Three times a day (TID) | SUBCUTANEOUS | Status: DC
  Administered 2016-04-08 – 2016-04-09 (×4): 6 [IU] via SUBCUTANEOUS

## 2016-04-08 MED ORDER — GLUCERNA SHAKE PO LIQD
237.0000 mL | Freq: Two times a day (BID) | ORAL | Status: DC
  Administered 2016-04-09 (×2): 237 mL via ORAL
  Filled 2016-04-08 (×2): qty 237

## 2016-04-08 MED ORDER — GI COCKTAIL ~~LOC~~
30.0000 mL | Freq: Once | ORAL | Status: AC
  Administered 2016-04-08: 30 mL via ORAL
  Filled 2016-04-08: qty 30

## 2016-04-08 MED ORDER — METHYLPREDNISOLONE SODIUM SUCC 40 MG IJ SOLR
20.0000 mg | Freq: Once | INTRAMUSCULAR | Status: AC
  Administered 2016-04-08: 20 mg via INTRAVENOUS
  Filled 2016-04-08: qty 1

## 2016-04-08 NOTE — Care Management Important Message (Signed)
Important Message  Patient Details  Name: JAZZMA NEIDHARDT MRN: [id number] Date of Birth: [birth year]   Medicare Important Message Given:  Yes    Camillo Flaming 04/08/2016, 10:57 AMImportant Message  Patient Details  Name: QUENTIN STREBEL MRN: [id number] Date of Birth: 08/18/1950   Medicare Important Message Given:  Yes    Camillo Flaming 04/08/2016, 10:57 AM

## 2016-04-08 NOTE — Care Management Note (Signed)
Case Management Note  Patient Details  Name: Brenda [name] MRN: [id number] Date of Birth: [birth year]  Subjective/Objective: 65 y/o f admitted w/COPD. From home.Noted-Wheezing,rhonchi.                   Action/Plan:d/c plan home.   Expected Discharge Date:   (unknown)               Expected Discharge Plan:  Home/Self Care  In-House Referral:     Discharge planning Services  CM Consult  Post Acute Care Choice:    Choice offered to:     DME Arranged:    DME Agency:     HH Arranged:    HH Agency:     Status of Service:  In process, will continue to follow  If discussed at Long Length of Stay Meetings, dates discussed:    Additional Comments:  Dessa Phi, RN 04/08/2016, 2:58 PM

## 2016-04-08 NOTE — Progress Notes (Signed)
Pt complaining of chest pain this morning. Pt states that it is a dull pain that radiates to her shoulder. NP was notified and ordered stat EKG, troponins and prn breathing tx. Pt stable at this time. No questions or concerns at this time.  Loran Auguste W Muaad Boehning, RN

## 2016-04-08 NOTE — Progress Notes (Addendum)
Called by RN regarding patient patient complaints of chest pressure and shortness of breath.  Admitted with AECOPD and concern for recurrent aspiration pneumonia.  No nausea / vomiting / diaphoresis reported.    Plan: Assess EKG now Troponin x1 now Repeat Vitals GI cocktail x1 After EKG, PRN albuterol treatment  Will reassess at bedside  Noe Gens, NP-C [facility] Pgr: [phone] or if no answer [phone] 04/08/2016, 8:16 AM

## 2016-04-08 NOTE — Progress Notes (Signed)
Inpatient Diabetes Program Recommendations  AACE/ADA: New Consensus Statement on Inpatient Glycemic Control (2015)  Target Ranges:  Prepandial:   less than 140 mg/dL      Peak postprandial:   less than 180 mg/dL (1-2 hours)      Critically ill patients:  140 - 180 mg/dL   Lab Results  Component Value Date   GLUCAP 150 (H) 04/08/2016   HGBA1C 8.9 (H) 10/06/2015    Review of Glycemic Control  Diabetes history: DM 2 Outpatient Diabetes medications: Lantus 5 units, Humalog 3 units TID Current orders for Inpatient glycemic control: Lantus 20 units Daily, Novolog Resistant TID  Post-prandial blood sugars elevated in the presence of steroids. Needs meal coverage insulin.  Inpatient Diabetes Program Recommendations:    Add Novolog 6 units tidwc for meal coverage insulin. Add CHO mod med to diet.   Spoke with Noe Gens, NP new orders written  Thanks,  Tama Headings RN, MSN, [facility] Inpatient Diabetes Coordinator Team Pager [phone] (8a-5p)

## 2016-04-08 NOTE — Progress Notes (Signed)
Nutrition Follow-up  DOCUMENTATION CODES:   Severe malnutrition in context of chronic illness  INTERVENTION:  Discontinued Ensure Enlive in setting of elevated CBGs even with resistant Novolog sliding scale.   Ordered Glucerna Shake po BID, each supplement provides 220 kcal and 10 grams of protein.  Continue Premier Protein po BID, each supplement provides 160 kcal and 30 grams of protein.  Encouraged adequate intake of protein and calories with meals. Patient still not amenable to eating meat, milk, yogurt, or peanut butter regularly.  NUTRITION DIAGNOSIS:   Increased nutrient needs related to catabolic illness (COPD exacerbation) as evidenced by estimated needs.  Ongoing.  GOAL:   Patient will meet greater than or equal to 90% of their needs  Progressing.   MONITOR:   PO intake, Supplement acceptance, Labs, Weight trends, I & O's  REASON FOR ASSESSMENT:   Malnutrition Screening Tool    ASSESSMENT:   65 y.o.femaleactive smoker with multiple medical problems including severe COPD (Simple spirometry FEV1 June 2016 0.67 L (27% predicted) with clear airflow obstruction), recurrent aspiration PNA, CAD, DM. Now with significant SOB, wheezing, unable to sleep, poor appetite, progressive weakness and occasional dizziness, found to have COPD exacerbation.  -Patient changed to CHO Modified diet today in setting of uncontrolled CBG.   Spoke with patient at bedside. She reports appetite is not improved. Denies N/V or abdominal pain. She did not have any Premier Protein recently, but reports drinking 2 Ensure Enlive in the past 24 hrs.   Meal Completion: 0-50%; However meal ordered was very large. In the past 24 hours, patient has had 1414 kcal (98% minimum estimated kcal needs) and 59 grams protein (74% minimum estimated protein needs).   Medications reviewed and include: Novolog resistant scale TID with meals + Novolog 6 units TID with meals, Lantus 20 units daily, prednisone  40 mg daily.  Labs reviewed: CBG 146-451 past 24 hrs. No recent chem profile.   Diet Order:  Diet Carb Modified Fluid consistency: Thin; Room service appropriate? Yes  Skin:  Reviewed, no issues  Last BM:  04/05/2016  Height:   Ht Readings from Last 1 Encounters:  04/03/16 '5\' 6"'$  (1.676 m)    Weight:   Wt Readings from Last 1 Encounters:  04/03/16 120 lb (54.4 kg)    Ideal Body Weight:  59.09 kg  BMI:  Body mass index is 19.37 kg/m.  Estimated Nutritional Needs:   Kcal:  1445-1670 (MSJ x 1.3-1.5)  Protein:  80-92 grams (1.5-1.7 grams/kg)  Fluid:  >/= 1.3 L/day (25 ml/kg)  EDUCATION NEEDS:   Education needs addressed  Willey Blade, MS, RD, LDN Pager: [phone] After Hours Pager: [phone]

## 2016-04-08 NOTE — Progress Notes (Signed)
   Name: Brenda [name] MRN: [id number] DOB: [birth year]    ADMISSION DATE:  04/03/16  CHIEF COMPLAINT:  Dyspnea   BRIEF PATIENT DESCRIPTION: Brenda [name] is a 65 y.o. female active smoker with multiple medical problems including severe COPD (Simple spirometry FEV1 June 2016 0.67 L (27% predicted) with clear airflow obstruction), recurrent aspiration PNA, CAD, DM.  She was admitted on 11/1 for acute on chronic respiratory failure with hypoxemia because she had severe dyspnea despite outpatient oral steroids and antibiotics.   SUBJECTIVE:  Pt reports ongoing cough, shortness of breath, chest tightness.  Reports cough with difficulty clearing secretions.    VITAL SIGNS: Temp:  [98.2 F (36.8 C)-98.8 F (37.1 C)] 98.4 F (36.9 C) (11/06 0835) Pulse Rate:  [88-95] 90 (11/06 0835) Resp:  [18-22] 22 (11/06 0835) BP: (100-134)/(54-76) 128/63 (11/06 0835) SpO2:  [96 %-100 %] 96 % (11/06 0910) FiO2 (%):  [36 %] 36 % (11/06 0910)    PHYSICAL EXAMINATION: General - Appropriate, oriented,sitting in bed HENT - NCAT, OP clear PULM - poor air movement, wheezing, deep/moist cough.  CV - RRR, no mgr GI - abd soft, nontender MSK - normal bulk and tone, no bony abnormalities Neuro - awake, alert, no distress   Recent Labs Lab 04/03/16 1136  NA 137  K 4.1  CL 94*  CO2 31  BUN 17  CREATININE 0.93  0.74  GLUCOSE 220*    Recent Labs Lab 04/03/16 1136  HGB 14.8  HCT 43.7  WBC 7.3  PLT 173   No results found.   SIGNIFICANT EVENTS  11/01  Admit with AECOPD, failed outpatient therapy  STUDIES:  CXR 11/1>>> no infiltrate  ABX: Vanco 11/1 >> 11/3 Zoxsyn 11/1 >> 11/3  Levaquin 11/3 >>    ASSESSMENT / PLAN:  Acute Exacerbation of Severe COPD - failed outpt rx x 2 oral antibiotics and oral steroids Severe COPD  Acute on chronic respiratory failure with hypoxemia - had tripoding, accessory muscle use with transfer to bed 11/1 Hx Recurrent aspiration PNA    PLAN: Changed to po steroids - tapering Abx as above, D6/7 abx Supplemental O2 as needed to keep sats 88-92%  BD's - duonebs Q6, budesonide, PRN albuterol  Smoking cessation encouraged Out of bed as tolerated Encourage hydration Additional dose solu-medrol today, 20 mg (for total of '60mg'$  today) Assess CXR now  Dyspnea / Chest Pressure - rule out ACS  PLAN: Assess troponin x1 now GI cocktail x1  DM 2 with severe hyperglycemia from IV steroids  PLAN:  Will need lantus adjustment prior to dc Add meal coverage, 6 units TID with meals Change diet to carb modified SSI > resistant scale Steroids PO taper.  Hx CAD  PLAN: Cont home asa, statin   DISPO - SOB, chest pressure.  Hold discharge.  Follow up troponin.    Noe Gens, NP-C [place] Pgr: [phone] or if no answer [phone] 04/08/2016, 9:37 AM

## 2016-04-09 DIAGNOSIS — J181 Lobar pneumonia, unspecified organism: Secondary | ICD-10-CM

## 2016-04-09 LAB — CBC
HCT: 35.8 % — ABNORMAL LOW (ref 36.0–46.0)
Hemoglobin: 12 g/dL (ref 12.0–15.0)
MCH: 30.3 pg (ref 26.0–34.0)
MCHC: 33.5 g/dL (ref 30.0–36.0)
MCV: 90.4 fL (ref 78.0–100.0)
PLATELETS: 182 10*3/uL (ref 150–400)
RBC: 3.96 MIL/uL (ref 3.87–5.11)
RDW: 14.3 % (ref 11.5–15.5)
WBC: 8.5 10*3/uL (ref 4.0–10.5)

## 2016-04-09 LAB — BASIC METABOLIC PANEL
ANION GAP: 7 (ref 5–15)
BUN: 22 mg/dL — ABNORMAL HIGH (ref 6–20)
CALCIUM: 9 mg/dL (ref 8.9–10.3)
CO2: 30 mmol/L (ref 22–32)
Chloride: 98 mmol/L — ABNORMAL LOW (ref 101–111)
Creatinine, Ser: 0.76 mg/dL (ref 0.44–1.00)
Glucose, Bld: 162 mg/dL — ABNORMAL HIGH (ref 65–99)
Potassium: 4 mmol/L (ref 3.5–5.1)
Sodium: 135 mmol/L (ref 135–145)

## 2016-04-09 LAB — GLUCOSE, CAPILLARY
GLUCOSE-CAPILLARY: 161 mg/dL — AB (ref 65–99)
Glucose-Capillary: 190 mg/dL — ABNORMAL HIGH (ref 65–99)

## 2016-04-09 MED ORDER — INSULIN GLARGINE 100 UNIT/ML ~~LOC~~ SOLN: SUBCUTANEOUS | 11 refills | Status: DC

## 2016-04-09 MED ORDER — LEVOFLOXACIN 500 MG PO TABS: 500.0000 mg | ORAL_TABLET | Freq: Every day | ORAL | 0 refills | Status: DC

## 2016-04-09 MED ORDER — PREDNISONE 10 MG PO TABS: ORAL_TABLET | ORAL | 0 refills | Status: DC

## 2016-04-09 MED ORDER — GLUCERNA SHAKE PO LIQD: 237.0000 mL | Freq: Two times a day (BID) | ORAL | 0 refills | Status: AC

## 2016-04-09 NOTE — Discharge Summary (Signed)
Physician Discharge Summary  Patient ID: MARGART ZEMANEK MRN: [id number] DOB/AGE: Jun 16, 1950 65 y.o.  Admit date: 04/03/2016 Discharge date: 04/09/2016    Discharge Diagnoses:  Acute Exacerbation of COPD  Severe COPD  Tobacco Abuse  Acute on Chronic Respiratory Failure with Hypoxemia  Recurrent Aspiration PNA / RLL CAP - present on admit Chest Pressure / Tightness - ACS ruled out  Hx HLD, PAD, CAD s/p PCI DM II with Steroid Induced Hyperglycemia                                                                       DISCHARGE PLAN BY DIAGNOSIS     Acute Exacerbation of COPD  Severe COPD  Tobacco Abuse  Acute on Chronic Respiratory Failure with Hypoxemia  Recurrent Aspiration PNA - present on admit, RLL airspace disease  Discharge Plan: Prednisone taper to off  Complete 10 days abx for AECOPD + aspiration pna  O2 at 3L at baseline, increase to 4 post discharge, discussed with patient  Smoking cessation counseling  Follow up with Pulmonary as arranged   Chest Pressure / Tightness - ACS ruled out  Hx HLD, PAD, CAD s/p PCI  Discharge Plan: Continue prior home medications  Follow up with Cardiology as previously arranged   DM II with Steroid Induced Hyperglycemia   Discharge Plan: Resume home regimen Increase lantus to 10 units at home while on prednisone taper.  Resume 5 units lantus once completed.  Discussed with patient, indicates understanding.                    DISCHARGE SUMMARY   EVA GRIFFO is a 65 y.o. y/o female female, active smoker, with multiple medical problems including severe COPD (simple spirometry FEV1 June 2016 0.67 L (27% predicted) with clear airflow obstruction), recurrent aspiration PNA, CAD, DM.  She was admitted on 11/1 for acute on chronic respiratory failure with hypoxemia because she had severe dyspnea despite outpatient oral steroids and antibiotics.  The patient was treated with IV antibiotics for AECOPD and suspected aspiration PNA, IV  steroids, nebulized bronchodilators and oxygen.  Hospital course complicated by hyperglycemia in the setting of steroid use.  CXR demonstrated a RLL CAP / suspected recurrent aspiration.  On 11/6, the patient had an episode of chest pressure / dyspnea.  ACS was ruled out (EKG unchanged, troponin negative).   She made slow improvement and tolerated reduction of steroids.  The patient was medically cleared for discharge 11/7 with plans as above.             SIGNIFICANT EVENTS  11/01  Admit with AECOPD, failed outpatient therapy  ABX: Vanco 11/1 >> 11/3 Zoxsyn 11/1 >> 11/3  Levaquin 11/3 >>  complete 10 days total abx    Discharge Exam: General- Appropriate, oriented,sitting in bed HENT - NCAT, OP clear PULM - non-labored, lungs bilaterally with soft wheeze, +non-productive moist cough  CV - RRR, no mgr GI - abd soft, nontender MSK - normal bulk and tone, no bony abnormalities Neuro - awake, alert, no distress  Vitals:   04/08/16 1432 04/09/16 0557 04/09/16 0825 04/09/16 0833  BP: (!) 90/54 (!) 120/59    Pulse: 90 78    Resp: 18 18  Temp: 98.7 F (37.1 C) 97.9 F (36.6 C)    TempSrc: Oral Oral    SpO2: 92% 97% (!) 88% (!) 88%  Weight:      Height:         Discharge Labs  BMET  Recent Labs Lab 04/03/16 1136 04/09/16 0528  NA 137 135  K 4.1 4.0  CL 94* 98*  CO2 31 30  GLUCOSE 220* 162*  BUN 17 22*  CREATININE 0.93  0.74 0.76  CALCIUM 9.3 9.0    CBC  Recent Labs Lab 04/03/16 1136 04/09/16 0528  HGB 14.8 12.0  HCT 43.7 35.8*  WBC 7.3 8.5  PLT 173 182    Discharge Instructions    Call MD for:  difficulty breathing, headache or visual disturbances    Complete by:  As directed    Call MD for:  extreme fatigue    Complete by:  As directed    Call MD for:  hives    Complete by:  As directed    Call MD for:  persistant dizziness or light-headedness    Complete by:  As directed    Call MD for:  persistant nausea and vomiting    Complete by:  As  directed    Call MD for:  redness, tenderness, or signs of infection (pain, swelling, redness, odor or green/yellow discharge around incision site)    Complete by:  As directed    Call MD for:  severe uncontrolled pain    Complete by:  As directed    Call MD for:  temperature >100.4    Complete by:  As directed    Diet - low sodium heart healthy    Complete by:  As directed    Discharge instructions    Complete by:  As directed    1.  Review your medications carefully as you may have adjustments to your prior regimen  2.  Complete all of your levaquin (antibiotic) as prescribed.  Next dose due 11/8 3.  Complete prednisone as prescribed  4.  While you are on prednisone, take 10 units of lantus (insulin glargine, long acting) per day.  Then return back to 5 units daily once prednisone completed  5.  Follow up as arranged.  6.  Return to the ER if you have new or worsening symptoms. 7.  Stop smoking   Increase activity slowly    Complete by:  As directed        Follow-up Information    Rexene Edison, NP Follow up on 04/15/2016.   Specialty:  Pulmonary Disease Why:  4:00 pm Contact information: [address] [address] [phone]        PARRETT, Joelene Millin H Follow up.   Specialty:  Family Medicine Contact information: [place] [phone]              Medication List    TAKE these medications   acetaminophen 500 MG tablet Commonly known as:  TYLENOL Take 1,000 mg by mouth 2 (two) times daily as needed for moderate pain or headache.   albuterol 108 (90 Base) MCG/ACT inhaler Commonly known as:  PROVENTIL HFA;VENTOLIN HFA Inhale 2 puffs into the lungs every 6 (six) hours as needed for wheezing or shortness of breath.   aspirin EC 81 MG tablet Take 1 tablet (81 mg total) by mouth daily.   buPROPion 300 MG 24 hr tablet Commonly known as:  WELLBUTRIN XL Take 300 mg by mouth  daily.   clopidogrel 75 MG  tablet Commonly known as:  PLAVIX Take 1 tablet (75 mg total) by mouth daily.   docusate sodium 100 MG capsule Commonly known as:  COLACE Take 100 mg by mouth daily as needed for mild constipation.   feeding supplement (GLUCERNA SHAKE) Liqd Take 237 mLs by mouth 2 (two) times daily between meals.   fish oil-omega-3 fatty acids 1000 MG capsule Take 1 g by mouth 2 (two) times daily.   fluticasone 50 MCG/ACT nasal spray Commonly known as:  FLONASE Place 2 sprays into both nostrils daily.   fluticasone furoate-vilanterol 100-25 MCG/INH Aepb Commonly known as:  BREO ELLIPTA Inhale 1 puff into the lungs daily.   guaiFENesin 600 MG 12 hr tablet Commonly known as:  MUCINEX Take 1,200 mg by mouth 2 (two) times daily.   insulin aspart 100 UNIT/ML injection Commonly known as:  novoLOG Inject 0-20 Units into the skin 3 (three) times daily with meals.   insulin glargine 100 UNIT/ML injection Commonly known as:  LANTUS Take 10 units of lantus daily while on prednisone.  After prednisone is completed, return to 5 units daily as previously prescribed. What changed:  how much to take  how to take this  when to take this  additional instructions   insulin lispro 100 UNIT/ML injection Commonly known as:  HUMALOG Inject 3 Units into the skin 3 (three) times daily with meals as needed for high blood sugar.   ipratropium-albuterol 0.5-2.5 (3) MG/3ML Soln Commonly known as:  DUONEB Take 3 mLs by nebulization every 6 (six) hours. What changed:  when to take this  reasons to take this   levofloxacin 500 MG tablet Commonly known as:  LEVAQUIN Take 1 tablet (500 mg total) by mouth daily. Start taking on:  04/10/2016   loratadine 10 MG tablet Commonly known as:  CLARITIN Take 1 tablet (10 mg total) by mouth daily.   MULTIVITAMIN & MINERAL PO Take 1 tablet by mouth daily.   neomycin-bacitracin-polymyxin ointment Commonly known as:  NEOSPORIN Apply 1 application topically as  needed. For open wound   nitroGLYCERIN 0.4 MG/SPRAY spray Commonly known as:  NITROLINGUAL Place 1 spray under the tongue every 5 (five) minutes as needed for chest pain.   omeprazole 20 MG capsule Commonly known as:  PRILOSEC Take 20 mg by mouth daily.   predniSONE 10 MG tablet Commonly known as:  DELTASONE Take 3 tabs daily with food x 4 days, then 2 tabs daily x 4 days, then 1 tabs daily x 4 days, then stop. What changed:  how much to take  how to take this  when to take this  additional instructions   rosuvastatin 20 MG tablet Commonly known as:  CRESTOR Take 20 mg by mouth daily.   SPIRIVA RESPIMAT 2.5 MCG/ACT Aers Generic drug:  Tiotropium Bromide Monohydrate 2 puffs daily.         Disposition:   Discharged Condition: SHANETHA BRADHAM has met maximum benefit of inpatient care and is medically stable and cleared for discharge.  Patient is pending follow up as above.      Time spent on disposition:  Greater than 35 minutes.   Signed: Noe Gens, NP-C [facility] Pgr: [phone] Office: [phone]

## 2016-04-09 NOTE — Progress Notes (Signed)
Discharge instructions given to pt, verbalized understanding. Left the unit in stable condition. 

## 2016-04-09 NOTE — Care Management Note (Signed)
Case Management Note  Patient Details  Name: MIATA CULBRETH MRN: [id number] Date of Birth: [birth year]  Subjective/Objective:                  COPD Action/Plan: Discharge planning Expected Discharge Date:  04/09/16               Expected Discharge Plan:  Home/Self Care  In-House Referral:     Discharge planning Services  CM Consult  Post Acute Care Choice:  NA Choice offered to:  Patient  DME Arranged:  Oxygen DME Agency:  NA  HH Arranged:  NA HH Agency:     Status of Service:  Completed, signed off  If discussed at [place] of Stay Meetings, dates discussed:    Additional Comments: CM met with pt who is O2 dependent at home and states her family will be providing transportation home and will bring a portable tank of O2 for ride home. No other CM needs were communicated. Dellie Catholic, RN 04/09/2016, 1:53 PM

## 2016-04-15 ENCOUNTER — Ambulatory Visit (INDEPENDENT_AMBULATORY_CARE_PROVIDER_SITE_OTHER): Payer: Medicare Other | Admitting: Adult Health

## 2016-04-15 ENCOUNTER — Ambulatory Visit (INDEPENDENT_AMBULATORY_CARE_PROVIDER_SITE_OTHER)
Admission: RE | Admit: 2016-04-15 | Discharge: 2016-04-15 | Disposition: A | Discharge status: Home / Self Care | Payer: Medicare Other | Source: Ambulatory Visit | Attending: Adult Health | Admitting: Adult Health

## 2016-04-15 ENCOUNTER — Encounter: Payer: Self-pay | Admitting: Adult Health

## 2016-04-15 VITALS — BP 130/60 | HR 96 | Temp 97.9°F | Ht 65.5 in | Wt 127.0 lb

## 2016-04-15 DIAGNOSIS — J432 Centrilobular emphysema: Secondary | ICD-10-CM

## 2016-04-15 DIAGNOSIS — J9611 Chronic respiratory failure with hypoxia: Secondary | ICD-10-CM

## 2016-04-15 NOTE — Progress Notes (Signed)
Subjective:    Patient ID: Brenda [name], female    DOB: 11/28/1950, 65 y.o.   MRN: [id number]  HPI 65yo female smoker with severe COPD and recurrent PNA   TEST  Simple spirometry FEV1 June 2016 0.67 L (27% predicted) with clear airflow obstruction   04/15/2016 Follow up : COPD  Pt returns for a post hospitalization visit. She was admitted for COPD exacerbation , PNA .  She was treated with abx and steroids . She has finished abx. Has few days of prednisone left.  She is feeling better . Decreased cough and dyspnea.  Denies chest pain, orthopnea, edema or fever.  Discharged on o2 4lm/ (normally on 3l/m w/ act and .At bedtime  ) . O2 sats on 3l/m 98%.  Remains on BREO and Spiriva .      Past Medical History:  Diagnosis Date  . Arthritis    "hands" (11/26/2012)  . CAD S/P percutaneous coronary angioplasty 1999; 2001;2003, 10/2014   a) BMS- mCx; b) re-do PCI for prog of Dz (NIR BMS 3.5 x 12); c) staged PCI: p-mRCA Express II BMS 3.0x24 & 3.5 x 16 --> p-mLAD Cyper DES 3.0 x 13 & PTCA of D2 (2.5 balloon); 10/2015 PCI p-m LAD Promus DES 2.75 x 38 (covers pre&post-stent ISR), POBA of ostial rPDA  . Cancer of base of tongue ([place]) 2010   "& lymph nodes @ right neck" (11/26/2012  . Carotid artery occlusion   . Chronic bronchitis ([place])   . COPD (chronic obstructive pulmonary disease) with emphysema ([place])   . Depression   . GERD (gastroesophageal reflux disease)   . H/O hiatal hernia   . History of blood transfusion   . History of DVT (deep vein thrombosis)   . Hx of radiation therapy   . Hx of radiation therapy 01/16/09 - 03/06/09   base of tongue, right neck node  . Hypercholesteremia   . Myocardial infarction 1999, 2003  . PAD (peripheral artery disease) ([name])    status post bilateral femoropopliteal bypass grafting --> Dr. Sherren Mocha Early; -  02/2013 Revision of R Fem-AKPop to BK Pop.  . Patent foramen ovale February 2013   Small. Small PFO noted on TTE for TIA/CVA R-L + Bubble study  with valsalva  . Pneumonia   . Stroke [facility])    left side weakness remains (11/26/2012)  . Transient ischemic attack (TIA) February 2013  . Type II diabetes mellitus ([place])    Current Outpatient Prescriptions on File Prior to Visit  Medication Sig Dispense Refill  . acetaminophen (TYLENOL) 500 MG tablet Take 1,000 mg by mouth 2 (two) times daily as needed for moderate pain or headache.     . albuterol (PROVENTIL HFA;VENTOLIN HFA) 108 (90 BASE) MCG/ACT inhaler Inhale 2 puffs into the lungs every 6 (six) hours as needed for wheezing or shortness of breath. 1 Inhaler 0  . aspirin EC 81 MG tablet Take 1 tablet (81 mg total) by mouth daily. 30 tablet 10  . buPROPion (WELLBUTRIN XL) 300 MG 24 hr tablet Take 300 mg by mouth daily.     . clopidogrel (PLAVIX) 75 MG tablet Take 1 tablet (75 mg total) by mouth daily. 30 tablet 12  . docusate sodium (COLACE) 100 MG capsule Take 100 mg by mouth daily as needed for mild constipation.     . feeding supplement, GLUCERNA SHAKE, (GLUCERNA SHAKE) LIQD Take 237 mLs by mouth 2 (two) times daily between meals.  0  . fish oil-omega-3 fatty acids  1000 MG capsule Take 1 g by mouth 2 (two) times daily.     . fluticasone (FLONASE) 50 MCG/ACT nasal spray Place 2 sprays into both nostrils daily. 16 g 2  . fluticasone furoate-vilanterol (BREO ELLIPTA) 100-25 MCG/INH AEPB Inhale 1 puff into the lungs daily. 60 each 5  . guaiFENesin (MUCINEX) 600 MG 12 hr tablet Take 1,200 mg by mouth 2 (two) times daily.    . insulin glargine (LANTUS) 100 UNIT/ML injection Take 10 units of lantus daily while on prednisone.  After prednisone is completed, return to 5 units daily as previously prescribed. 10 mL 11  . insulin lispro (HUMALOG) 100 UNIT/ML injection Inject 3 Units into the skin 3 (three) times daily with meals as needed for high blood sugar.     . ipratropium-albuterol (DUONEB) 0.5-2.5 (3) MG/3ML SOLN Take 3 mLs by nebulization every 6 (six) hours. (Patient taking differently: Take  3 mLs by nebulization 2 (two) times daily as needed (for wheezingt or shortness of breath). ) 360 mL 3  . loratadine (CLARITIN) 10 MG tablet Take 1 tablet (10 mg total) by mouth daily. 30 tablet 0  . Multiple Vitamins-Minerals (MULTIVITAMIN & MINERAL PO) Take 1 tablet by mouth daily.    Marland Kitchen neomycin-bacitracin-polymyxin (NEOSPORIN) ointment Apply 1 application topically as needed. For open wound     . nitroGLYCERIN (NITROLINGUAL) 0.4 MG/SPRAY spray Place 1 spray under the tongue every 5 (five) minutes as needed for chest pain. 12 g 0  . omeprazole (PRILOSEC) 20 MG capsule Take 20 mg by mouth daily.     . predniSONE (DELTASONE) 10 MG tablet Take 3 tabs daily with food x 4 days, then 2 tabs daily x 4 days, then 1 tabs daily x 4 days, then stop. 24 tablet 0  . rosuvastatin (CRESTOR) 20 MG tablet Take 20 mg by mouth daily.    Marland Kitchen SPIRIVA RESPIMAT 2.5 MCG/ACT AERS 2 puffs daily.     No current facility-administered medications on file prior to visit.      Review of Systems Constitutional:   No  weight loss, night sweats,  Fevers, chills,  +fatigue, or  lassitude.  HEENT:   No headaches,  Difficulty swallowing,  Tooth/dental problems, or  Sore throat,                No sneezing, itching, ear ache,  +nasal congestion, post nasal drip,   CV:  No chest pain,  Orthopnea, PND, swelling in lower extremities, anasarca, dizziness, palpitations, syncope.   GI  No heartburn, indigestion, abdominal pain, nausea, vomiting, diarrhea, change in bowel habits, loss of appetite, bloody stools.   Resp:    No chest wall deformity  Skin: no rash or lesions.  GU: no dysuria, change in color of urine, no urgency or frequency.  No flank pain, no hematuria   MS:  No joint pain or swelling.  No decreased range of motion.  No back pain.  Psych:  No change in mood or affect. No depression or anxiety.  No memory loss.         Objective:   Physical Exam Vitals:   04/15/16 1609  BP: 130/60  Pulse: 96  Temp:  97.9 F (36.6 C)  TempSrc: Oral  SpO2: 93%  Weight: 127 lb (57.6 kg)  Height: 5' 5.5" (1.664 m)   GEN: A/Ox3; pleasant , NAD, frail and elderly    HEENT:  [place]/AT,  EACs-clear, TMs-wnl, NOSE-clear, THROAT-clear, no lesions, no postnasal drip or exudate noted.   NECK:  Supple w/ fair ROM; no JVD; normal carotid impulses w/o bruits; no thyromegaly or nodules palpated; no lymphadenopathy.    RESP  Decreased BS in bases , , no accessory muscle use, no dullness to percussion, Kyphosis noted.   CARD:  RRR, no m/r/g  , tr  peripheral edema, pulses intact, no cyanosis or clubbing.  GI:   Soft & nt; nml bowel sounds; no organomegaly or masses detected.   Musco: Warm bil, no deformities or joint swelling noted.   Neuro: alert, no focal deficits noted.    Skin: Warm, no lesions or rashes    Oyinkansola Truax NP-C  [facility]  04/15/2016      Assessment & Plan:

## 2016-04-15 NOTE — Patient Instructions (Addendum)
Finish Prednisone taper .  Chest xray today  Can decrease Oxygen 3l/m .Marland Kitchen  Follow up Dr. Lake Bells next month as planned and As needed   Great job on not smoking  Please contact office for sooner follow up if symptoms do not improve or worsen or seek emergency care

## 2016-04-15 NOTE — Progress Notes (Signed)
Reviewed, agree.  I admitted her to the hospital with [facility].

## 2016-04-15 NOTE — Assessment & Plan Note (Addendum)
Recent flare w/ PNA -now resolving  Check cxr today   Plan  Patient Instructions  Finish Prednisone taper .  Chest xray today  Can decrease Oxygen 3l/m .Marland Kitchen  Follow up Dr. Lake Bells next month as planned and As needed   [place]an job on not smoking

## 2016-04-15 NOTE — Assessment & Plan Note (Signed)
May decrease O2 3l/m

## 2016-04-16 ENCOUNTER — Ambulatory Visit: Payer: Medicare Other | Admitting: Vascular Surgery

## 2016-04-16 ENCOUNTER — Encounter (HOSPITAL_COMMUNITY): Payer: Medicare Other

## 2016-04-16 NOTE — Progress Notes (Signed)
Agree with this plan of care

## 2016-04-29 DIAGNOSIS — I2510 Atherosclerotic heart disease of native coronary artery without angina pectoris: Secondary | ICD-10-CM | POA: Diagnosis not present

## 2016-04-29 DIAGNOSIS — Z95820 Peripheral vascular angioplasty status with implants and grafts: Secondary | ICD-10-CM | POA: Diagnosis not present

## 2016-04-29 DIAGNOSIS — Z85810 Personal history of malignant neoplasm of tongue: Secondary | ICD-10-CM | POA: Diagnosis not present

## 2016-04-29 DIAGNOSIS — I70232 Atherosclerosis of native arteries of right leg with ulceration of calf: Secondary | ICD-10-CM | POA: Diagnosis not present

## 2016-04-29 DIAGNOSIS — L97211 Non-pressure chronic ulcer of right calf limited to breakdown of skin: Secondary | ICD-10-CM | POA: Diagnosis not present

## 2016-04-29 DIAGNOSIS — Z8673 Personal history of transient ischemic attack (TIA), and cerebral infarction without residual deficits: Secondary | ICD-10-CM | POA: Diagnosis not present

## 2016-04-29 DIAGNOSIS — Z9221 Personal history of antineoplastic chemotherapy: Secondary | ICD-10-CM | POA: Diagnosis not present

## 2016-04-29 DIAGNOSIS — Z9861 Coronary angioplasty status: Secondary | ICD-10-CM | POA: Diagnosis not present

## 2016-04-29 DIAGNOSIS — J449 Chronic obstructive pulmonary disease, unspecified: Secondary | ICD-10-CM | POA: Diagnosis not present

## 2016-04-29 DIAGNOSIS — I252 Old myocardial infarction: Secondary | ICD-10-CM | POA: Diagnosis not present

## 2016-04-29 DIAGNOSIS — E11622 Type 2 diabetes mellitus with other skin ulcer: Secondary | ICD-10-CM | POA: Diagnosis present

## 2016-04-29 DIAGNOSIS — F17210 Nicotine dependence, cigarettes, uncomplicated: Secondary | ICD-10-CM | POA: Diagnosis not present

## 2016-04-29 DIAGNOSIS — E1151 Type 2 diabetes mellitus with diabetic peripheral angiopathy without gangrene: Secondary | ICD-10-CM | POA: Diagnosis not present

## 2016-05-01 ENCOUNTER — Emergency Department (HOSPITAL_BASED_OUTPATIENT_CLINIC_OR_DEPARTMENT_OTHER)
Admission: EM | Admit: 2016-05-01 | Discharge: 2016-05-01 | Disposition: A | Discharge status: Home / Self Care | Payer: Medicare Other | Attending: Emergency Medicine | Admitting: Emergency Medicine

## 2016-05-01 ENCOUNTER — Emergency Department (HOSPITAL_BASED_OUTPATIENT_CLINIC_OR_DEPARTMENT_OTHER): Payer: Medicare Other

## 2016-05-01 ENCOUNTER — Encounter (HOSPITAL_BASED_OUTPATIENT_CLINIC_OR_DEPARTMENT_OTHER): Payer: Self-pay

## 2016-05-01 DIAGNOSIS — I2510 Atherosclerotic heart disease of native coronary artery without angina pectoris: Secondary | ICD-10-CM | POA: Diagnosis not present

## 2016-05-01 DIAGNOSIS — Y929 Unspecified place or not applicable: Secondary | ICD-10-CM | POA: Insufficient documentation

## 2016-05-01 DIAGNOSIS — Y939 Activity, unspecified: Secondary | ICD-10-CM | POA: Diagnosis not present

## 2016-05-01 DIAGNOSIS — Z7982 Long term (current) use of aspirin: Secondary | ICD-10-CM | POA: Insufficient documentation

## 2016-05-01 DIAGNOSIS — X58XXXA Exposure to other specified factors, initial encounter: Secondary | ICD-10-CM | POA: Insufficient documentation

## 2016-05-01 DIAGNOSIS — J449 Chronic obstructive pulmonary disease, unspecified: Secondary | ICD-10-CM | POA: Insufficient documentation

## 2016-05-01 DIAGNOSIS — M545 Low back pain, unspecified: Secondary | ICD-10-CM

## 2016-05-01 DIAGNOSIS — Z85810 Personal history of malignant neoplasm of tongue: Secondary | ICD-10-CM | POA: Diagnosis not present

## 2016-05-01 DIAGNOSIS — Y999 Unspecified external cause status: Secondary | ICD-10-CM | POA: Diagnosis not present

## 2016-05-01 DIAGNOSIS — S22088A Other fracture of T11-T12 vertebra, initial encounter for closed fracture: Secondary | ICD-10-CM | POA: Insufficient documentation

## 2016-05-01 DIAGNOSIS — E119 Type 2 diabetes mellitus without complications: Secondary | ICD-10-CM | POA: Insufficient documentation

## 2016-05-01 DIAGNOSIS — Z79899 Other long term (current) drug therapy: Secondary | ICD-10-CM | POA: Insufficient documentation

## 2016-05-01 DIAGNOSIS — Z794 Long term (current) use of insulin: Secondary | ICD-10-CM | POA: Insufficient documentation

## 2016-05-01 DIAGNOSIS — S22000A Wedge compression fracture of unspecified thoracic vertebra, initial encounter for closed fracture: Secondary | ICD-10-CM

## 2016-05-01 DIAGNOSIS — Z87891 Personal history of nicotine dependence: Secondary | ICD-10-CM | POA: Diagnosis not present

## 2016-05-01 DIAGNOSIS — S299XXA Unspecified injury of thorax, initial encounter: Secondary | ICD-10-CM | POA: Diagnosis present

## 2016-05-01 DIAGNOSIS — I252 Old myocardial infarction: Secondary | ICD-10-CM | POA: Insufficient documentation

## 2016-05-01 MED ORDER — TRAMADOL HCL 50 MG PO TABS: 50.0000 mg | ORAL_TABLET | Freq: Four times a day (QID) | ORAL | 0 refills | Status: DC | PRN

## 2016-05-01 MED ORDER — ACETAMINOPHEN 325 MG PO TABS: 650.0000 mg | ORAL_TABLET | Freq: Four times a day (QID) | ORAL | 0 refills | Status: AC | PRN

## 2016-05-01 MED ORDER — POLYETHYLENE GLYCOL 3350 17 GM/SCOOP PO POWD: 17.0000 g | Freq: Two times a day (BID) | ORAL | 0 refills | Status: AC

## 2016-05-01 MED ORDER — TRAMADOL HCL 50 MG PO TABS
50.0000 mg | ORAL_TABLET | Freq: Once | ORAL | Status: AC
  Administered 2016-05-01: 50 mg via ORAL
  Filled 2016-05-01: qty 1

## 2016-05-01 MED FILL — ACETAMINOPHEN 325 MG TABLET: 325 | 25 days supply | Qty: 100 | Fill #0

## 2016-05-01 MED FILL — traMADol HCL 50 MG TABS: 50 | 3 days supply | Qty: 15 | Fill #0

## 2016-05-01 MED FILL — POLYETHYLENE GLYCOL 3350: 8 days supply | Qty: 255 | Fill #0

## 2016-05-01 NOTE — ED Triage Notes (Signed)
C/olower back pain-started Saturday-unrelieved by heat and motrin-slow gait

## 2016-05-01 NOTE — ED Notes (Signed)
Patient transported to X-ray 

## 2016-05-01 NOTE — Discharge Instructions (Signed)
Xray today showed a T12 compression fracture with a height loss of about 20%.

## 2016-05-01 NOTE — ED Provider Notes (Signed)
[facility] DEPT [facility] Provider Note   CSN: [id number] Arrival date & time: 05/01/16  1147     History   Chief Complaint Chief Complaint  Patient presents with  . Back Pain    HPI Brenda [name] is a 65 y.o. female.  Brenda [name] is a 65 y.o. Female with a history of COPD who presents to the emergency department with her daughter complaining of bilateral low back pain that is worse on her right side for the past 5 days. Patient denies any injury or trauma to the area. No recent falls. She reports her pain is worse with movement and with standing. She reports taking an 800 mg ibuprofen around 10 AM this morning with little relief. She believes she may have had a pulled muscle. She denies any known injury or trauma to the area. She denies fevers, numbness, tingling, weakness, loss of bladder control, loss of bowel control, history of IV drug use, rapid changes to her weight, urinary symptoms, hematuria, abdominal pain, nausea, vomiting or rashes.    Back Pain   Pertinent negatives include no chest pain, no fever, no numbness, no headaches, no abdominal pain, no dysuria and no weakness.    Past Medical History:  Diagnosis Date  . Arthritis    "hands" (11/26/2012)  . CAD S/P percutaneous coronary angioplasty 1999; 2001;2003, 10/2014   a) BMS- mCx; b) re-do PCI for prog of Dz (NIR BMS 3.5 x 12); c) staged PCI: p-mRCA Express II BMS 3.0x24 & 3.5 x 16 --> p-mLAD Cyper DES 3.0 x 13 & PTCA of D2 (2.5 balloon); 10/2015 PCI p-m LAD Promus DES 2.75 x 38 (covers pre&post-stent ISR), POBA of ostial rPDA  . Cancer of base of tongue ([place]) 2010   "& lymph nodes @ right neck" (11/26/2012  . Carotid artery occlusion   . Chronic bronchitis ([place])   . COPD (chronic obstructive pulmonary disease) with emphysema ([place])   . Depression   . GERD (gastroesophageal reflux disease)   . H/O hiatal hernia   . History of blood transfusion   . History of DVT (deep vein thrombosis)   . Hx of radiation  therapy   . Hx of radiation therapy 01/16/09 - 03/06/09   base of tongue, right neck node  . Hypercholesteremia   . Myocardial infarction 1999, 2003  . PAD (peripheral artery disease) ([name])    status post bilateral femoropopliteal bypass grafting --> Dr. Sherren Mocha Early; -  02/2013 Revision of R Fem-AKPop to BK Pop.  . Patent foramen ovale February 2013   Small. Small PFO noted on TTE for TIA/CVA R-L + Bubble study with valsalva  . Pneumonia   . Stroke [facility])    left side weakness remains (11/26/2012)  . Transient ischemic attack (TIA) February 2013  . Type II diabetes mellitus [name]     Patient Active Problem List   Diagnosis Date Noted  . Protein-calorie malnutrition, severe 04/05/2016  . COPD with acute exacerbation ([place]) 04/03/2016  . Ulcer of right leg ([place]) 11/09/2015  . Hypotension 10/26/2015  . Angina pectoris ([place]) 10/06/2015  . CAD S/P percutaneous coronary angioplasty   . Acute on chronic respiratory failure with hypoxia ([place]) 10/05/2015  . Protein-calorie malnutrition, moderate ([place]) 10/05/2015  . Depression 10/05/2015  . GERD (gastroesophageal reflux disease) 10/05/2015  . Positive cardiac stress test 09/26/2015  . Abnormal nuclear stress test - HIGH RISK 09/26/2015  . Angina, class III ([place]) 09/07/2015  . Dysphagia with aspiration 06/02/2015  . Thrush 05/18/2015  .  Chronic hypoxemic respiratory failure ([place]) 11/04/2014  . COPD exacerbation ([place])   . Alcohol abuse   . Pain of left lower extremity- and Right 11/23/2013  . Fatigue 08/23/2013  . Aftercare following surgery of the circulatory system, [place] 02/23/2013  . Peripheral vascular disease, unspecified ([place]) 01/26/2013  . COPD (chronic obstructive pulmonary disease) ([place]) 12/24/2012  . Claudication in peripheral vascular disease ([place]) 11/27/2012  . PVD-Hx of BFBPG '99 and '00. Dopplers OK 6/13   . Cancer of base of tongue- radiation 2010   . Hx of radiation therapy   . DM (diabetes mellitus), type 2, uncontrolled  ([place]) 07/23/2011  . TIA (transient ischemic attack)-2013 07/23/2011  . History of CVA (cerebrovascular accident) 07/23/2011  . Tobacco abuse 07/23/2011  . Hyperlipidemia with target LDL less than 70 07/23/2011  . CAD: Stents in p-mRCA, p & m Cx, p-mLAD 02/01/1998    Past Surgical History:  Procedure Laterality Date  . ANKLE FRACTURE SURGERY Right 2007   "got a metal plate and 8 screws in it" (11/26/2012)  . APPENDECTOMY  1962  . BYPASS GRAFT POPLITEAL TO POPLITEAL Right 02/05/2013   Procedure: BYPASS GRAFT ABOVE KNEE POPLITEAL TO BELOW KNEE POPLITEAL WITH SMALL SAPHANOUS VEIN;  Surgeon: Rosetta Posner, MD;  Location: [place];  Service: Vascular;  Laterality: Right;  . CARDIAC CATHETERIZATION  1999; 10/2005   5/'07: mildLAD 20-30%, patent stent, diags- no Dz. RCA 2 BMS patent ~20% ISR.   Marland Kitchen CARDIAC CATHETERIZATION N/A 10/06/2015   Procedure: Left Heart Cath and Coronary Angiography;  Surgeon: Leonie Man, MD;  Location: [place] CV LAB;  Service: Cardiovascular; p-mLAD 99% pre-stent, 60% ISR & 80% post-stent, o-RPDA 90%, ~10% ISR in 2 RCA (Prox & mid) BMS,~20% ISR in pCx BMS with 5% OM2 BMS.    Marland Kitchen CARDIAC CATHETERIZATION N/A 10/06/2015   Procedure: Coronary Stent Intervention;  Surgeon: Leonie Man, MD;  Location: [place] CV LAB;  Service: Cardiovascular; p-m LAD Promus DES 2.75 x 38 (tapered post-dilatoin)  . CARDIAC CATHETERIZATION N/A 10/06/2015   Procedure: Coronary Balloon Angioplasty;  Surgeon: Leonie Man, MD;  Location: [place] [facility];  Service: Cardiovascular;  POBA of Ostial rPDA.  Marland Kitchen CARPAL TUNNEL RELEASE Right 1980's?  Marland Kitchen CATARACT EXTRACTION W/ INTRAOCULAR LENS IMPLANT Right 2011  . [place]  . CORONARY ANGIOPLASTY WITH STENT PLACEMENT  1999--2005   1999 - OM2 BMS; 2001: p-mCx NIR BMS 3.5 x 12; 10/2001: p-mRCA Express II BMS - 3.5 x 16 - 3.0 x 24.   . ESOPHAGOGASTRODUODENOSCOPY  12/20/2011   Procedure: ESOPHAGOGASTRODUODENOSCOPY (EGD);  Surgeon: Beryle Beams, MD;  Location: [facility];  Service: Endoscopy;  Laterality: N/A;  EGD with balloon dilation  . EYE SURGERY    . FEMORAL BYPASS  1999; 2000   Bilateral Fem-AK POP bypass.   Marland Kitchen PERIPHERAL VASCULAR CATHETERIZATION N/A 12/01/2015   Procedure: Abdominal Aortogram w/Lower Extremity;  Surgeon: Elam Dutch, MD;  Location: [place] CV LAB;  Service: Cardiovascular;  Laterality: N/A;  . PERIPHERAL VASCULAR CATHETERIZATION Right 12/22/2015   Procedure: Peripheral Vascular Intervention;  Surgeon: Rosetta Posner, MD;  Location: [place] CV LAB;  Service: Cardiovascular;  Laterality: Right;  EXternal iliac  . SAVORY DILATION  12/20/2011   Procedure: SAVORY DILATION;  Surgeon: Beryle Beams, MD;  Location: [facility];  Service: Endoscopy;  Laterality: N/A;  . TEE WITHOUT CARDIOVERSION  07/26/2011   Procedure: TRANSESOPHAGEAL ECHOCARDIOGRAM (TEE);  Surgeon: Pixie Casino, MD;  Location: [facility];  Service: Cardiovascular;  Laterality: N/A;  . TONSILLECTOMY  1969  . TRANSTHORACIC ECHOCARDIOGRAM  March 2016   Normal LV size and function EF 55-60%. No regional wall motion and amount is. No significant valvular lesions.  . TUBAL LIGATION  1988  . VAGINAL HYSTERECTOMY  1988    OB History    No data available       Home Medications    Prior to Admission medications   Medication Sig Start Date End Date Taking? Authorizing Provider  acetaminophen (TYLENOL) 325 MG tablet Take 2 tablets (650 mg total) by mouth every 6 (six) hours as needed. 05/01/16   [name], PA-C  albuterol (PROVENTIL HFA;VENTOLIN HFA) 108 (90 BASE) MCG/ACT inhaler Inhale 2 puffs into the lungs every 6 (six) hours as needed for wheezing or shortness of breath. 01/20/15   Belkys A Regalado, MD  aspirin EC 81 MG tablet Take 1 tablet (81 mg total) by mouth daily. 10/07/15   Thurnell Lose, MD  buPROPion (WELLBUTRIN XL) 300 MG 24 hr tablet Take 300 mg by mouth daily.  09/19/15   Historical Provider, MD  clopidogrel  (PLAVIX) 75 MG tablet Take 1 tablet (75 mg total) by mouth daily. 10/07/15   Thurnell Lose, MD  docusate sodium (COLACE) 100 MG capsule Take 100 mg by mouth daily as needed for mild constipation.     Historical Provider, MD  feeding supplement, GLUCERNA SHAKE, (GLUCERNA SHAKE) LIQD Take 237 mLs by mouth 2 (two) times daily between meals. 04/09/16   Donita Brooks, NP  fish oil-omega-3 fatty acids 1000 MG capsule Take 1 g by mouth 2 (two) times daily.     Historical Provider, MD  fluticasone (FLONASE) 50 MCG/ACT nasal spray Place 2 sprays into both nostrils daily. 06/02/15   Debbe Odea, MD  fluticasone furoate-vilanterol (BREO ELLIPTA) 100-25 MCG/INH AEPB Inhale 1 puff into the lungs daily. 09/13/15   Tammy S Parrett, NP  guaiFENesin (MUCINEX) 600 MG 12 hr tablet Take 1,200 mg by mouth 2 (two) times daily.    Historical Provider, MD  insulin glargine (LANTUS) 100 UNIT/ML injection Take 10 units of lantus daily while on prednisone.  After prednisone is completed, return to 5 units daily as previously prescribed. 04/09/16   Donita Brooks, NP  insulin lispro (HUMALOG) 100 UNIT/ML injection Inject 3 Units into the skin 3 (three) times daily with meals as needed for high blood sugar.     Historical Provider, MD  ipratropium-albuterol (DUONEB) 0.5-2.5 (3) MG/3ML SOLN Take 3 mLs by nebulization every 6 (six) hours. Patient taking differently: Take 3 mLs by nebulization 2 (two) times daily as needed (for wheezingt or shortness of breath).  06/02/15   Debbe Odea, MD  loratadine (CLARITIN) 10 MG tablet Take 1 tablet (10 mg total) by mouth daily. 06/02/15   Debbe Odea, MD  Multiple Vitamins-Minerals (MULTIVITAMIN & MINERAL PO) Take 1 tablet by mouth daily.    Historical Provider, MD  neomycin-bacitracin-polymyxin (NEOSPORIN) ointment Apply 1 application topically as needed. For open wound     Historical Provider, MD  nitroGLYCERIN (NITROLINGUAL) 0.4 MG/SPRAY spray Place 1 spray under the tongue every 5 (five)  minutes as needed for chest pain. 01/20/15   Belkys A Regalado, MD  omeprazole (PRILOSEC) 20 MG capsule Take 20 mg by mouth daily.     Historical Provider, MD  polyethylene glycol powder (GLYCOLAX/MIRALAX) powder Take 17 g by mouth 2 (two) times daily. 05/01/16   [name], PA-C  predniSONE (  DELTASONE) 10 MG tablet Take 3 tabs daily with food x 4 days, then 2 tabs daily x 4 days, then 1 tabs daily x 4 days, then stop. 04/09/16   Donita Brooks, NP  rosuvastatin (CRESTOR) 20 MG tablet Take 20 mg by mouth daily.    Historical Provider, MD  SPIRIVA RESPIMAT 2.5 MCG/ACT AERS 2 puffs daily. 10/24/15   Historical Provider, MD  traMADol (ULTRAM) 50 MG tablet Take 1 tablet (50 mg total) by mouth every 6 (six) hours as needed for moderate pain or severe pain. 05/01/16   [name], PA-C    Family History Family History  Problem Relation Age of Onset  . Cancer Mother     pancreatic  . Diabetes Mother   . Hyperlipidemia Mother   . Hypertension Mother   . Other Mother     varicose vein  . Cancer Father 72    throat  . Heart disease Father   . Heart attack Father   . Cancer Sister     breast  . Diabetes Sister   . Deep vein thrombosis Brother   . Diabetes Brother   . Hearing loss Brother   . Hyperlipidemia Brother   . Hypertension Brother   . Heart attack Brother   . Clotting disorder Brother   . Diabetes Son   . Hyperlipidemia Son     Social History Social History  Substance Use Topics  . Smoking status: Former Smoker    Packs/day: 1.00    Years: 50.00    Types: Cigarettes    Quit date: 04/03/2016  . Smokeless tobacco: Never Used     Comment: up to 0.5 ppd 02/13/16  . Alcohol use Yes     Comment: occ     Allergies   Tape; Isosorbide; and Nicoderm [nicotine]   Review of Systems Review of Systems  Constitutional: Negative for chills and fever.  Eyes: Negative for visual disturbance.  Respiratory: Negative for cough and shortness of breath.   Cardiovascular: Negative  for chest pain.  Gastrointestinal: Negative for abdominal pain, nausea and vomiting.  Genitourinary: Negative for decreased urine volume, difficulty urinating, dysuria, flank pain, frequency, hematuria and urgency.  Musculoskeletal: Positive for back pain. Negative for neck pain.  Skin: Negative for color change and rash.  Neurological: Negative for dizziness, syncope, weakness, light-headedness, numbness and headaches.     Physical Exam Updated Vital Signs BP 93/61 (BP Location: Right Arm)   Pulse 78   Temp 98.4 F (36.9 C) (Oral)   Resp 18   Ht '5\' 5"'$  (1.651 m)   Wt 56.2 kg   SpO2 100%   BMI 20.63 kg/m   Physical Exam  Constitutional: She is oriented to person, place, and time. She appears well-developed and well-nourished. No distress.  HENT:  Head: Normocephalic and atraumatic.  Right Ear: External ear normal.  Left Ear: External ear normal.  Eyes: Conjunctivae are normal. Pupils are equal, round, and reactive to light. Right eye exhibits no discharge. Left eye exhibits no discharge.  Neck: Neck supple.  Cardiovascular: Normal rate, regular rhythm, normal heart sounds and intact distal pulses.   Bilateral dorsalis pedis and radial pulses are intact.  Pulmonary/Chest: Effort normal and breath sounds normal. No respiratory distress. She has no wheezes. She has no rales.  Oxygen saturation is 99% on 2 L via nasal cannula. Patient is on home oxygen.  Abdominal: Soft. There is no tenderness. There is no guarding.  Abdomen is soft and nontender to palpation.  Musculoskeletal:  Normal range of motion. She exhibits tenderness. She exhibits no edema or deformity.  Patient has tenderness to her bilateral lumbar musculature. No midline neck or back tenderness. Patient seems to be more tender in her right low back musculature. No back erythema, deformity, ecchymosis or warmth. Good strength in her bilateral lower extremities. No lower extremity edema or tenderness.  Lymphadenopathy:     She has no cervical adenopathy.  Neurological: She is alert and oriented to person, place, and time. She displays normal reflexes. Coordination normal.  Sensation is intact in her bilateral lower extremities. Normal gait. Bilateral patellar DTRs are intact.  Skin: Skin is warm and dry. Capillary refill takes less than 2 seconds. No rash noted. She is not diaphoretic. No erythema. No pallor.  Psychiatric: She has a normal mood and affect. Her behavior is normal.  Nursing note and vitals reviewed.    ED Treatments / Results  Labs (all labs ordered are listed, but only abnormal results are displayed) Labs Reviewed - No data to display  EKG  EKG Interpretation None       Radiology Dg Lumbar Spine Complete  Result Date: 05/01/2016 CLINICAL DATA:  Low back pain over the last 4 days. EXAM: LUMBAR SPINE - COMPLETE 4+ VIEW COMPARISON:  None. FINDINGS: Five lumbar type vertebral bodies show normal alignment. There is a superior endplate fracture at W73 with loss of height of 20%. This is newly seen since a CT of 02/19/2016. No disc space narrowing. Mild lower lumbar facet osteoarthritis. Large amount a gas and fecal matter throughout the colon. Multiple renal calculi on the right. Probable single renal calculus on the left. Aortic atherosclerosis. IMPRESSION: Superior endplate fracture at X10, likely acute or subacute. No retropulsed bone. Large amount of gas and fecal matter throughout the colon. Aortic atherosclerosis. Renal stone disease. Electronically Signed   By: Nelson Chimes M.D.   On: 05/01/2016 14:16    Procedures Procedures (including critical care time)  Medications Ordered in ED Medications  traMADol (ULTRAM) tablet 50 mg (50 mg Oral Given 05/01/16 1314)     Initial Impression / Assessment and Plan / ED Course  I have reviewed the triage vital signs and the nursing notes.  Pertinent labs & imaging results that were available during my care of the patient were reviewed by me  and considered in my medical decision making (see chart for details).  Clinical Course    This is a 65 y.o. Female with a history of COPD who presents to the emergency department with her daughter complaining of bilateral low back pain that is worse on her right side for the past 5 days. Patient denies any injury or trauma to the area. No recent falls. She reports her pain is worse with movement and with standing. She reports taking an 800 mg ibuprofen around 10 AM this morning with little relief. She believes she may have had a pulled muscle. She denies any known injury or trauma to the area. She denies fevers, numbness, tingling, weakness, loss of bladder control, loss of bowel control.  On exam the patient is afebrile nontoxic appearing. She has tenderness across her bilateral low back musculature which is worse on her right side. No midline neck or back tenderness. No crepitus. No deformity. No ecchymosis or edema noted. She is able to ambulatory with normal gait. She has no focal neurological deficits. Lumbar spine x-ray was ordered in triage. It indicated a superior endplate fracture at G26, likely acute or subacute. No retropulsed bone.  It also shows a large amount of fecal matter throughout the colon. Is also some renal stone disease. Patient's history does not sound like renal calculus. Patient's pain is worse with movement and worse with palpation. This is likely related to this T12 compression fracture. Her pain does seem to be lower than the T12 area. She has no focal neurological deficits. She reports feeling better after tramadol. We'll discharge her with tramadol. She also notes having some chronic constipation. Provided her with a prescription for MiraLAX. Discussed strict specific return precautions. Will have her follow-up with orthopedic surgery and primary care. I advised the patient to follow-up with their primary care provider this week. I advised the patient to return to the emergency  department with new or worsening symptoms or new concerns. The patient verbalized understanding and agreement with plan.    This patient was discussed with and evaluated by Dr. Ashok Cordia who agrees with assessment and plan.   Final Clinical Impressions(s) / ED Diagnoses   Final diagnoses:  Compression fracture of body of thoracic vertebra ([name])  Acute bilateral low back pain without sciatica    New Prescriptions New Prescriptions   ACETAMINOPHEN (TYLENOL) 325 MG TABLET    Take 2 tablets (650 mg total) by mouth every 6 (six) hours as needed.   POLYETHYLENE GLYCOL POWDER (GLYCOLAX/MIRALAX) POWDER    Take 17 g by mouth 2 (two) times daily.   TRAMADOL (ULTRAM) 50 MG TABLET    Take 1 tablet (50 mg total) by mouth every 6 (six) hours as needed for moderate pain or severe pain.     [name], PA-C 05/01/16 1604    Lajean Saver, MD 05/02/16 [phone]

## 2016-05-01 NOTE — ED Triage Notes (Signed)
Pt O2 sats high 80s-hx of COPD with home O2-no O2 on at this time-pt placed on O2 2L [place] in tx room

## 2016-05-01 NOTE — ED Notes (Signed)
Pt states it is okay to talk with her daughter Becky Sax on the phone about her condition. Daughter updated.

## 2016-05-03 ENCOUNTER — Ambulatory Visit (INDEPENDENT_AMBULATORY_CARE_PROVIDER_SITE_OTHER): Payer: Self-pay | Admitting: Orthopaedic Surgery

## 2016-05-06 ENCOUNTER — Encounter (HOSPITAL_BASED_OUTPATIENT_CLINIC_OR_DEPARTMENT_OTHER): Payer: Medicare Other | Attending: Internal Medicine

## 2016-05-06 ENCOUNTER — Ambulatory Visit (INDEPENDENT_AMBULATORY_CARE_PROVIDER_SITE_OTHER): Payer: Medicare Other | Admitting: Cardiology

## 2016-05-06 ENCOUNTER — Encounter: Payer: Self-pay | Admitting: Cardiology

## 2016-05-06 VITALS — BP 118/76 | HR 72 | Ht 65.0 in | Wt 127.2 lb

## 2016-05-06 DIAGNOSIS — Z720 Tobacco use: Secondary | ICD-10-CM

## 2016-05-06 DIAGNOSIS — J449 Chronic obstructive pulmonary disease, unspecified: Secondary | ICD-10-CM | POA: Diagnosis not present

## 2016-05-06 DIAGNOSIS — K5900 Constipation, unspecified: Secondary | ICD-10-CM | POA: Insufficient documentation

## 2016-05-06 DIAGNOSIS — E785 Hyperlipidemia, unspecified: Secondary | ICD-10-CM

## 2016-05-06 DIAGNOSIS — E11621 Type 2 diabetes mellitus with foot ulcer: Secondary | ICD-10-CM | POA: Diagnosis not present

## 2016-05-06 DIAGNOSIS — Z923 Personal history of irradiation: Secondary | ICD-10-CM | POA: Insufficient documentation

## 2016-05-06 DIAGNOSIS — I25119 Atherosclerotic heart disease of native coronary artery with unspecified angina pectoris: Secondary | ICD-10-CM | POA: Diagnosis not present

## 2016-05-06 DIAGNOSIS — Z9221 Personal history of antineoplastic chemotherapy: Secondary | ICD-10-CM | POA: Insufficient documentation

## 2016-05-06 DIAGNOSIS — E1136 Type 2 diabetes mellitus with diabetic cataract: Secondary | ICD-10-CM | POA: Insufficient documentation

## 2016-05-06 DIAGNOSIS — I2510 Atherosclerotic heart disease of native coronary artery without angina pectoris: Secondary | ICD-10-CM

## 2016-05-06 DIAGNOSIS — Z9861 Coronary angioplasty status: Secondary | ICD-10-CM

## 2016-05-06 DIAGNOSIS — K5909 Other constipation: Secondary | ICD-10-CM

## 2016-05-06 DIAGNOSIS — L97811 Non-pressure chronic ulcer of other part of right lower leg limited to breakdown of skin: Secondary | ICD-10-CM | POA: Insufficient documentation

## 2016-05-06 DIAGNOSIS — E440 Moderate protein-calorie malnutrition: Secondary | ICD-10-CM

## 2016-05-06 DIAGNOSIS — I950 Idiopathic hypotension: Secondary | ICD-10-CM

## 2016-05-06 DIAGNOSIS — I739 Peripheral vascular disease, unspecified: Secondary | ICD-10-CM | POA: Diagnosis not present

## 2016-05-06 NOTE — Assessment & Plan Note (Signed)
No syncope significant anginal type symptoms since her last PCI. Remains hemodynamically stable. No real heart failure symptoms either. She is on aspirin and statin think she can intermittently hold her aspirin if necessary for bleeding. Not on any blood pressure medications such as beta blocker or ACE inhibitor. She is on moderate dose statin.

## 2016-05-06 NOTE — Assessment & Plan Note (Signed)
Continue to supplement her food intake with real food. This should help minimize edema.

## 2016-05-06 NOTE — Assessment & Plan Note (Signed)
Looks of healed well from this recent iliac stent. Claudication symptoms improved and her wound seems to be healing better.

## 2016-05-06 NOTE — Assessment & Plan Note (Signed)
This seems to be one of her big issues today and really needs to work on her adequate hydration to avoid getting constipated again. She is currently using Dulcolax and MiraLAX and has not had a bowel movement. I think she really needs to talk with her primary physician about options on how to manage this. I am concerned that she may start having signs of obstipation and would end up being sick in the hospital. We talked about using enemas for now.

## 2016-05-06 NOTE — Assessment & Plan Note (Signed)
Status post right external iliac stent in July. Claudication symptoms have notably improved and her wound is healing. Still has coolness to touch. I suspect she has microvascular or small vessel below the knee disease as well.

## 2016-05-06 NOTE — Patient Instructions (Signed)
Can hold  Aspirin for 3-4 days if you have nose bleed or bleeding, then restart.   No other changes otherwise.  SUGGEST using an enema for constipation if no relief contact your primary doctor.  Labs in 6 month will mail you a lab slip in May/june 2018.    Your physician wants you to follow-up in:6 months with Dr Ellyn Hack.You will receive a reminder letter in the mail two months in advance. If you don't receive a letter, please call our office to schedule the follow-up appointment.   If you need a refill on your cardiac medications before your next appointment, please call your pharmacy.

## 2016-05-06 NOTE — Assessment & Plan Note (Signed)
Unable to titrate blood pressure medications on. Her blood pressure is low but higher which would suggest the patient is probably low but improved.

## 2016-05-06 NOTE — Assessment & Plan Note (Signed)
LDL is pretty good the last check. She remains on statin.

## 2016-05-06 NOTE — Assessment & Plan Note (Signed)
She has made a long step toward discontinuing cigarettes. However she is still smoking.

## 2016-05-06 NOTE — Assessment & Plan Note (Signed)
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 I would prefer to maintain aspirin plus Plavix if at all possible, but okay to hold aspirin intermittently for bruising or bleeding. Able treat with standard medications such as beta blocker and ACE inhibitor.  She is simply on aspirin, Plavix and statin.

## 2016-05-06 NOTE — Progress Notes (Signed)
PCP: [name], MD  Clinic Note: Chief Complaint  Patient presents with  . Follow-up    6 months  . Coronary Artery Disease    s/p CADB & recent PCI  . PAD  . Constipation    HPI: Brenda [name] is a 65 y.o. female with a PMH below who presents today for six-month follow-up for CAD-PCI. She also has PAD/PVD OD with a history of bilateral femoropopliteal bypass followed by revision of the right femoropopliteal to below the knee in September 2014 (Dr. Donnetta Hutching) who also has known carotid disease and COPD.  Brenda [name] was last seen on last saw her in June of this year. Her main complaint that time was claudication and not angina -- right leg worse than left. She also had a ulcer in the right lower leg. --> has been see & treated by Dr. Donnetta Hutching  Recent Hospitalizations:   Admitted November 1-7 2017:  For COPD exacerbation, treated with antibiotics. Ruled out ACS with negative enzymes.  Studies Reviewed:   Abdominal aortogram and peripheral vascular intervention - Dr. Donnetta Hutching  Operative findings: #1 exophytic calcific stenosis right external iliac artery #2 patent right femoral to above-knee popliteal bypass #3 occluded jump graft from above-knee popliteal to below-knee popliteal right leg #4 patent left femoral to below-knee popliteal bypass  7/21: R Ext Iliac Angioplasty & Stent  Interval History: Brenda [name] presents today for follow-up of her cardiac disease and has noted only one time since I last saw her that she's had take nitroglycerin and she had some discomfort in her chest after waking up. Otherwise the only thing she notes is that she will go some days were the entire day she feels dizzy for the past she wakes up that she goes to bed. Otherwise she has not had any significant near-syncope. She is still recovering from a recent hospitalization with pneumonia/COPD exacerbation is still a little bit fatigued, but this is improving. She's not sleeping on her wedge pillow  anymore because of recent T11 compression fracture that is causing her a lot of pain. She is doing fine with no significant PND or orthopnea. Minimal edema. No rapid irregular heartbeats or palpitations.  No TIA/amaurosis fugax symptoms. No syncope/near syncope. Overall notably improved claudication symptoms on the right - does not notice it with routine walking unless she really just go fast. Is able to walk around [place] without any significant claudication or anginal pain. She does have baseline dyspnea but is trying to go as long as she can without using home oxygen. She does use at night.  ROS: A comprehensive was performed. Review of Systems  Constitutional: Positive for malaise/fatigue (pre-hospital). Negative for chills and fever.  HENT: Negative for congestion.   Eyes: Negative.   Respiratory: Positive for shortness of breath (Trying to wean off of full-time O2.  ) and wheezing. Negative for cough.   Cardiovascular: Positive for chest pain (1 episode - NTG since last visit), orthopnea (doing ok without wedge) and claudication (much improved -- only a little if she walks a whole lot;).  Gastrointestinal: Positive for abdominal pain (due to constipation), constipation (since hospital - taking Myrilax & Ducolax ) and nausea ( ). Negative for blood in stool and melena.       Very constipated - no BM x ~2 weeks; only passing a little gas.   Taking Ducolax & Myrilax  Genitourinary: Negative for hematuria.  Musculoskeletal: Positive for back pain (T11 compression fxr - cannot sleep with wedge).  Negative for joint pain.  Skin:       R leg ulcer healing slowly, but looking better  Neurological: Positive for dizziness (positional - but there all day if there in AM)). Negative for focal weakness, loss of consciousness and weakness.  Endo/Heme/Allergies: Positive for environmental allergies (takes daily claritin). Does not bruise/bleed easily.  Psychiatric/Behavioral: Negative for depression and  memory loss. The patient is not nervous/anxious and does not have insomnia.   All other systems reviewed and are negative.   Past Medical History:  Diagnosis Date  . Arthritis    "hands" (11/26/2012)  . CAD S/P percutaneous coronary angioplasty 1999; 2001;2003, 10/2014   a) BMS- mCx; b) re-do PCI for prog of Dz (NIR BMS 3.5 x 12); c) staged PCI: p-mRCA Express II BMS 3.0x24 & 3.5 x 16 --> p-mLAD Cyper DES 3.0 x 13 & PTCA of D2 (2.5 balloon); 10/2015 PCI p-m LAD Promus DES 2.75 x 38 (covers pre&post-stent ISR), POBA of ostial rPDA  . Cancer of base of tongue ([place]) 2010   "& lymph nodes @ right neck" (11/26/2012  . Carotid artery occlusion   . Chronic bronchitis ([place])   . COPD (chronic obstructive pulmonary disease) with emphysema ([place])   . Depression   . GERD (gastroesophageal reflux disease)   . H/O hiatal hernia   . History of blood transfusion   . History of DVT (deep vein thrombosis)   . Hx of radiation therapy   . Hx of radiation therapy 01/16/09 - 03/06/09   base of tongue, right neck node  . Hypercholesteremia   . Myocardial infarction 1999, 2003  . PAD (peripheral artery disease) ([name])    status post bilateral femoropopliteal bypass grafting --> Dr. Sherren Mocha Early; -  02/2013 Revision of R Fem-AKPop to BK Pop.  . Patent foramen ovale February 2013   Small. Small PFO noted on TTE for TIA/CVA R-L + Bubble study with valsalva  . Pneumonia   . Stroke [facility])    left side weakness remains (11/26/2012)  . Transient ischemic attack (TIA) February 2013  . Type II diabetes mellitus ([place])     Past Surgical History:  Procedure Laterality Date  . ANKLE FRACTURE SURGERY Right 2007   "got a metal plate and 8 screws in it" (11/26/2012)  . APPENDECTOMY  1962  . BYPASS GRAFT POPLITEAL TO POPLITEAL Right 02/05/2013   Procedure: BYPASS GRAFT ABOVE KNEE POPLITEAL TO BELOW KNEE POPLITEAL WITH SMALL SAPHANOUS VEIN;  Surgeon: Rosetta Posner, MD;  Location: [place];  Service: Vascular;  Laterality: Right;  .  CARDIAC CATHETERIZATION  1999; 10/2005   5/'07: mildLAD 20-30%, patent stent, diags- no Dz. RCA 2 BMS patent ~20% ISR.   Marland Kitchen CARDIAC CATHETERIZATION N/A 10/06/2015   Procedure: Left Heart Cath and Coronary Angiography;  Surgeon: Leonie Man, MD;  Location: [place] [facility];  Service: Cardiovascular; p-mLAD 99% pre-stent, 60% ISR & 80% post-stent, o-RPDA 90%, ~10% ISR in 2 RCA (Prox & mid) BMS,~20% ISR in pCx BMS with 5% OM2 BMS.    Marland Kitchen CARDIAC CATHETERIZATION N/A 10/06/2015   Procedure: Coronary Stent Intervention;  Surgeon: Leonie Man, MD;  Location: [place] [facility];  Service: Cardiovascular; p-m LAD Promus DES 2.75 x 38 (tapered post-dilatoin)  . CARDIAC CATHETERIZATION N/A 10/06/2015   Procedure: Coronary Balloon Angioplasty;  Surgeon: Leonie Man, MD;  Location: [place] [facility];  Service: Cardiovascular;  POBA of [place] rPDA.  Marland Kitchen CARPAL TUNNEL RELEASE Right 1980's?  Marland Kitchen CATARACT EXTRACTION  W/ INTRAOCULAR LENS IMPLANT Right 2011  . [place]  . CORONARY ANGIOPLASTY WITH STENT PLACEMENT  1999--2005   1999 - OM2 BMS; 2001: p-mCx NIR BMS 3.5 x 12; 10/2001: p-mRCA Express II BMS - 3.5 x 16 - 3.0 x 24.   . ESOPHAGOGASTRODUODENOSCOPY  12/20/2011   Procedure: ESOPHAGOGASTRODUODENOSCOPY (EGD);  Surgeon: Beryle Beams, MD;  Location: [facility];  Service: Endoscopy;  Laterality: N/A;  EGD with balloon dilation  . EYE SURGERY    . FEMORAL BYPASS  1999; 2000   Bilateral Fem-AK POP bypass.   Marland Kitchen PERIPHERAL VASCULAR CATHETERIZATION N/A 12/01/2015   Procedure: Abdominal Aortogram w/Lower Extremity;  Surgeon: Elam Dutch, MD;  Location: [place] [facility];  Service: Cardiovascular;  Laterality: N/A;  . PERIPHERAL VASCULAR CATHETERIZATION Right 12/22/2015   Procedure: Peripheral Vascular Intervention;  Surgeon: Rosetta Posner, MD;  Location: [place] [facility];  Service: Cardiovascular;  Laterality: Right;  EXternal iliac  . SAVORY DILATION  12/20/2011   Procedure: SAVORY DILATION;   Surgeon: Beryle Beams, MD;  Location: [facility];  Service: Endoscopy;  Laterality: N/A;  . TEE WITHOUT CARDIOVERSION  07/26/2011   Procedure: TRANSESOPHAGEAL ECHOCARDIOGRAM (TEE);  Surgeon: Pixie Casino, MD;  [facility];  Service: Cardiovascular;  Laterality: N/A;  . TONSILLECTOMY  1969  . TRANSTHORACIC ECHOCARDIOGRAM  March 2016   Normal LV size and function EF 55-60%. No regional wall motion and amount is. No significant valvular lesions.  . TUBAL LIGATION  1988  . VAGINAL HYSTERECTOMY  1988   Cath 10/2015:   Prox LAD to Mid LAD lesion, 99% stenosed. Mid LAD Cypher DES stent (2003), 60% stenosed with 80% stenosis distal to stent. Post intervention - Promus DES 2.75 x 38), there is a 0% residual stenosis. The lesion was previously treated with a drug-eluting stent greater than two years ago.  Ost RPDA lesion, 90% stenosed. Post POBA Only intervention, there is a 15% residual stenosis.            Current Meds  Medication Sig  . acetaminophen (TYLENOL) 325 MG tablet Take 2 tablets (650 mg total) by mouth every 6 (six) hours as needed.  Marland Kitchen albuterol (PROVENTIL HFA;VENTOLIN HFA) 108 (90 BASE) MCG/ACT inhaler Inhale 2 puffs into the lungs every 6 (six) hours as needed for wheezing or shortness of breath.  Marland Kitchen aspirin EC 81 MG tablet Take 1 tablet (81 mg total) by mouth daily.  Marland Kitchen buPROPion (WELLBUTRIN XL) 300 MG 24 hr tablet Take 300 mg by mouth daily.   . clopidogrel (PLAVIX) 75 MG tablet Take 1 tablet (75 mg total) by mouth daily.  Marland Kitchen docusate sodium (COLACE) 100 MG capsule Take 100 mg by mouth daily as needed for mild constipation.   . feeding supplement, GLUCERNA SHAKE, (GLUCERNA SHAKE) LIQD Take 237 mLs by mouth 2 (two) times daily between meals.  . fish oil-omega-3 fatty acids 1000 MG capsule Take 1 g by mouth 2 (two) times daily.   . fluticasone (FLONASE) 50 MCG/ACT nasal spray Place 2 sprays into both nostrils daily.  . fluticasone furoate-vilanterol (BREO ELLIPTA)  100-25 MCG/INH AEPB Inhale 1 puff into the lungs daily.  Marland Kitchen guaiFENesin (MUCINEX) 600 MG 12 hr tablet Take 1,200 mg by mouth 2 (two) times daily.  . insulin glargine (LANTUS) 100 UNIT/ML injection Inject 5 Units into the skin at bedtime.  . insulin lispro (HUMALOG) 100 UNIT/ML injection Inject 3 Units into the skin 3 (three) times daily with meals as needed  for high blood sugar.   . ipratropium-albuterol (DUONEB) 0.5-2.5 (3) MG/3ML SOLN Take 3 mLs by nebulization every 6 (six) hours. (Patient taking differently: Take 3 mLs by nebulization 2 (two) times daily as needed (for wheezingt or shortness of breath). )  . loratadine (CLARITIN) 10 MG tablet Take 1 tablet (10 mg total) by mouth daily.  . Multiple Vitamins-Minerals (MULTIVITAMIN & MINERAL PO) Take 1 tablet by mouth daily.  Marland Kitchen neomycin-bacitracin-polymyxin (NEOSPORIN) ointment Apply 1 application topically as needed. For open wound   . nitroGLYCERIN (NITROLINGUAL) 0.4 MG/SPRAY spray Place 1 spray under the tongue every 5 (five) minutes as needed for chest pain.  Marland Kitchen omeprazole (PRILOSEC) 20 MG capsule Take 20 mg by mouth daily.   . polyethylene glycol powder (GLYCOLAX/MIRALAX) powder Take 17 g by mouth 2 (two) times daily.  . rosuvastatin (CRESTOR) 20 MG tablet Take 20 mg by mouth daily.  Marland Kitchen SPIRIVA RESPIMAT 2.5 MCG/ACT AERS 2 puffs daily.  . traMADol (ULTRAM) 50 MG tablet Take 1 tablet (50 mg total) by mouth every 6 (six) hours as needed for moderate pain or severe pain.  . [DISCONTINUED] insulin glargine (LANTUS) 100 UNIT/ML injection Take 10 units of lantus daily while on prednisone.  After prednisone is completed, return to 5 units daily as previously prescribed.  . [DISCONTINUED] predniSONE (DELTASONE) 10 MG tablet Take 3 tabs daily with food x 4 days, then 2 tabs daily x 4 days, then 1 tabs daily x 4 days, then stop.   Allergies  Allergen Reactions  . Tape Hives and Other (See Comments)    USE PAPER TAPE ONLY- Adhesive peels off skin-makes  pt. Raw.  . Isosorbide Other (See Comments)    Drops BP too low  . Nicoderm [Nicotine] Rash    To the PATCH only, breakouts on skin     Social History   Social History  . Marital status: Married    Spouse name: N/A  . Number of children: N/A  . Years of education: N/A   Occupational History  . house wife    Social History Main Topics  . Smoking status: Former Smoker    Packs/day: 1.00    Years: 50.00    Types: Cigarettes    Quit date: 04/03/2016  . Smokeless tobacco: Never Used     Comment: up to 0.5 ppd 02/13/16  . Alcohol use Yes     Comment: occ  . Drug use: No  . Sexual activity: Not Asked   Other Topics Concern  . None   Social History Narrative  . None   Family History  Problem Relation Age of Onset  . Cancer Mother     pancreatic  . Diabetes Mother   . Hyperlipidemia Mother   . Hypertension Mother   . Other Mother     varicose vein  . Cancer Father 72    throat  . Heart disease Father   . Heart attack Father   . Cancer Sister     breast  . Diabetes Sister   . Deep vein thrombosis Brother   . Diabetes Brother   . Hearing loss Brother   . Hyperlipidemia Brother   . Hypertension Brother   . Heart attack Brother   . Clotting disorder Brother   . Diabetes Son   . Hyperlipidemia Son     Wt Readings from Last 3 Encounters:  05/06/16 57.7 kg (127 lb 3.2 oz)  05/01/16 56.2 kg (124 lb)  04/15/16 57.6 kg (127 lb)  PHYSICAL EXAM BP 118/76   Pulse 72   Ht '5\' 5"'$  (1.651 m)   Wt 57.7 kg (127 lb 3.2 oz)   LMP  (LMP Unknown)   BMI 21.17 kg/m  General appearance: alert, cooperative, appears Older than stated age,appears to be in chronic milds and well-groomed.  HEENT: [place]/AT, EOMI, MMM, anicteric sclera; poor dentition Neck: no adenopathy, no carotid bruit and no JVD; soft right carotid bruit Lungs:Minimal air movement bilaterally with diffuse expiratory wheezing.increased AP diameter - no increased WOB Heart: distant S1 and S2, regular rate and  rhythm, no murmur, click, rub or gallop; nondisplaced PMI  Abdomen: Somewhat protuberant firm abdomen with palpable stool in the left lower quadrant. Tympanitic but non-distended - no tenderness to palpation. Extremities: extremities normal, atraumatic, no cyanosis, and trivial edema  Pulses: 1+ Bilateral  DP pulse with faint PT pulse. R foot still notably cool to tough Skin: Dry scaly skin. Extensive brawny/reddish discoloration to bilateral lower extremities from knee down consistent with long-standing PAD and PVD. - R LE wound with dressing in place - no SSx of infection (being managed by [facility]) Neurologic: Mental status: Alert, oriented, thought content appropriate Cranial nerves: normal (II-XII grossly intact)    Adult ECG Report n/a  Other studies Reviewed: Additional studies/ records that were reviewed today include:  Recent Labs:   Lab Results  Component Value Date   CHOL 109 10/06/2015   HDL 65 10/06/2015   LDLCALC 36 10/06/2015   TRIG 42 10/06/2015   CHOLHDL 1.7 10/06/2015    ASSESSMENT / PLAN: Problem List Items Addressed This Visit    CAD: Stents in p-mRCA, p & m Cx, p-mLAD - Primary (Chronic)    No syncope significant anginal type symptoms since her last PCI. Remains hemodynamically stable. No real heart failure symptoms either. She is on aspirin and statin think she can intermittently hold her aspirin if necessary for bleeding. Not on any blood pressure medications such as beta blocker or ACE inhibitor. She is on moderate dose statin.      Relevant Orders   Lipid panel   Comprehensive metabolic panel   CAD S/P percutaneous coronary angioplasty (Chronic)    Multiple PCIS was some in-stent restenosis. I would prefer to maintain aspirin plus Plavix if at all possible, but okay to hold aspirin intermittently for bruising or bleeding. Able treat with standard medications such as beta blocker and ACE inhibitor.  She is simply on aspirin, Plavix and statin.       Relevant Orders   Lipid panel   Comprehensive metabolic panel   Hyperlipidemia with target LDL less than 70 (Chronic)    LDL is pretty good the last check. She remains on statin.      Relevant Orders   Lipid panel   Comprehensive metabolic panel   Claudication in peripheral vascular disease ([place]) (Chronic)    Looks of healed well from this recent iliac stent. Claudication symptoms improved and her wound seems to be healing better.      Relevant Orders   Lipid panel   Comprehensive metabolic panel   Tobacco abuse (Chronic)    She has made a long step toward discontinuing cigarettes. However she is still smoking.      Relevant Orders   Lipid panel   Comprehensive metabolic panel   Protein-calorie malnutrition, moderate ([name]) (Chronic)    Continue to supplement her food intake with real food. This should help minimize edema.      Relevant Orders   Lipid panel  Comprehensive metabolic panel   Hypotension (Chronic)    Unable to titrate blood pressure medications on. Her blood pressure is low but higher which would suggest the patient is probably low but improved.      Constipation    This seems to be one of her big issues today and really needs to work on her adequate hydration to avoid getting constipated again. She is currently using Dulcolax and MiraLAX and has not had a bowel movement. I think she really needs to talk with her primary physician about options on how to manage this. I am concerned that she may start having signs of obstipation and would end up being sick in the hospital. We talked about using enemas for now.      Relevant Orders   Lipid panel   Comprehensive metabolic panel   Angina pectoris ([name])   Relevant Orders   Lipid panel   Comprehensive metabolic panel      Current medicines are reviewed at length with the patient today. (+/- concerns) - constipated The following changes have been made: see below  Patient Instructions  Can hold  Aspirin  for 3-4 days if you have nose bleed or bleeding, then restart.   No other changes otherwise.  SUGGEST using an enema for constipation if no relief contact your primary doctor.  Labs in 6 month will mail you a lab slip in May/june 2018.    Your physician wants you to follow-up in:6 months with Dr Ellyn Hack.You will receive a reminder letter in the mail two months in advance. If you don't receive a letter, please call our office to schedule the follow-up appointment.   If you need a refill on your cardiac medications before your next appointment, please call your pharmacy.    Studies Ordered:   Orders Placed This Encounter  Procedures  . Lipid panel  . Comprehensive metabolic panel      Glenetta Hew, M.D., M.S. Interventional Cardiologist   Pager # [phone] Phone # [phone] [address] [place], [place] [address]

## 2016-05-09 ENCOUNTER — Encounter (INDEPENDENT_AMBULATORY_CARE_PROVIDER_SITE_OTHER): Payer: Self-pay | Admitting: Orthopaedic Surgery

## 2016-05-09 ENCOUNTER — Inpatient Hospital Stay (HOSPITAL_COMMUNITY)
Admission: EM | Admit: 2016-05-09 | Discharge: 2016-05-14 | DRG: 871 | Disposition: A | Discharge status: Home / Self Care | Payer: Medicare Other | Attending: Internal Medicine | Admitting: Internal Medicine

## 2016-05-09 ENCOUNTER — Encounter (HOSPITAL_COMMUNITY): Payer: Self-pay | Admitting: *Deleted

## 2016-05-09 ENCOUNTER — Ambulatory Visit (INDEPENDENT_AMBULATORY_CARE_PROVIDER_SITE_OTHER): Payer: Medicare Other | Admitting: Orthopaedic Surgery

## 2016-05-09 ENCOUNTER — Emergency Department (HOSPITAL_COMMUNITY): Payer: Medicare Other

## 2016-05-09 DIAGNOSIS — Z8249 Family history of ischemic heart disease and other diseases of the circulatory system: Secondary | ICD-10-CM

## 2016-05-09 DIAGNOSIS — T380X5A Adverse effect of glucocorticoids and synthetic analogues, initial encounter: Secondary | ICD-10-CM | POA: Diagnosis present

## 2016-05-09 DIAGNOSIS — Z923 Personal history of irradiation: Secondary | ICD-10-CM

## 2016-05-09 DIAGNOSIS — Z7951 Long term (current) use of inhaled steroids: Secondary | ICD-10-CM

## 2016-05-09 DIAGNOSIS — E440 Moderate protein-calorie malnutrition: Secondary | ICD-10-CM | POA: Diagnosis present

## 2016-05-09 DIAGNOSIS — J690 Pneumonitis due to inhalation of food and vomit: Secondary | ICD-10-CM

## 2016-05-09 DIAGNOSIS — I252 Old myocardial infarction: Secondary | ICD-10-CM

## 2016-05-09 DIAGNOSIS — K219 Gastro-esophageal reflux disease without esophagitis: Secondary | ICD-10-CM | POA: Diagnosis present

## 2016-05-09 DIAGNOSIS — D72829 Elevated white blood cell count, unspecified: Secondary | ICD-10-CM

## 2016-05-09 DIAGNOSIS — IMO0002 Reserved for concepts with insufficient information to code with codable children: Secondary | ICD-10-CM | POA: Diagnosis present

## 2016-05-09 DIAGNOSIS — E1151 Type 2 diabetes mellitus with diabetic peripheral angiopathy without gangrene: Secondary | ICD-10-CM | POA: Diagnosis present

## 2016-05-09 DIAGNOSIS — Z85810 Personal history of malignant neoplasm of tongue: Secondary | ICD-10-CM

## 2016-05-09 DIAGNOSIS — M47816 Spondylosis without myelopathy or radiculopathy, lumbar region: Secondary | ICD-10-CM

## 2016-05-09 DIAGNOSIS — J181 Lobar pneumonia, unspecified organism: Secondary | ICD-10-CM

## 2016-05-09 DIAGNOSIS — R109 Unspecified abdominal pain: Secondary | ICD-10-CM

## 2016-05-09 DIAGNOSIS — I959 Hypotension, unspecified: Secondary | ICD-10-CM | POA: Diagnosis present

## 2016-05-09 DIAGNOSIS — E785 Hyperlipidemia, unspecified: Secondary | ICD-10-CM | POA: Diagnosis present

## 2016-05-09 DIAGNOSIS — Z79899 Other long term (current) drug therapy: Secondary | ICD-10-CM

## 2016-05-09 DIAGNOSIS — Z87891 Personal history of nicotine dependence: Secondary | ICD-10-CM

## 2016-05-09 DIAGNOSIS — J441 Chronic obstructive pulmonary disease with (acute) exacerbation: Secondary | ICD-10-CM | POA: Diagnosis present

## 2016-05-09 DIAGNOSIS — E1165 Type 2 diabetes mellitus with hyperglycemia: Secondary | ICD-10-CM | POA: Diagnosis present

## 2016-05-09 DIAGNOSIS — Z8673 Personal history of transient ischemic attack (TIA), and cerebral infarction without residual deficits: Secondary | ICD-10-CM

## 2016-05-09 DIAGNOSIS — Z833 Family history of diabetes mellitus: Secondary | ICD-10-CM

## 2016-05-09 DIAGNOSIS — E871 Hypo-osmolality and hyponatremia: Secondary | ICD-10-CM | POA: Diagnosis present

## 2016-05-09 DIAGNOSIS — A419 Sepsis, unspecified organism: Secondary | ICD-10-CM | POA: Diagnosis not present

## 2016-05-09 DIAGNOSIS — I248 Other forms of acute ischemic heart disease: Secondary | ICD-10-CM | POA: Diagnosis present

## 2016-05-09 DIAGNOSIS — E7800 Pure hypercholesterolemia, unspecified: Secondary | ICD-10-CM | POA: Diagnosis present

## 2016-05-09 DIAGNOSIS — R0602 Shortness of breath: Secondary | ICD-10-CM | POA: Diagnosis not present

## 2016-05-09 DIAGNOSIS — R1310 Dysphagia, unspecified: Secondary | ICD-10-CM

## 2016-05-09 DIAGNOSIS — E861 Hypovolemia: Secondary | ICD-10-CM | POA: Diagnosis present

## 2016-05-09 DIAGNOSIS — Z86718 Personal history of other venous thrombosis and embolism: Secondary | ICD-10-CM

## 2016-05-09 DIAGNOSIS — I25119 Atherosclerotic heart disease of native coronary artery with unspecified angina pectoris: Secondary | ICD-10-CM | POA: Diagnosis present

## 2016-05-09 DIAGNOSIS — J440 Chronic obstructive pulmonary disease with acute lower respiratory infection: Secondary | ICD-10-CM | POA: Diagnosis present

## 2016-05-09 DIAGNOSIS — Z7982 Long term (current) use of aspirin: Secondary | ICD-10-CM

## 2016-05-09 DIAGNOSIS — Z7902 Long term (current) use of antithrombotics/antiplatelets: Secondary | ICD-10-CM

## 2016-05-09 DIAGNOSIS — J189 Pneumonia, unspecified organism: Secondary | ICD-10-CM | POA: Diagnosis present

## 2016-05-09 DIAGNOSIS — D649 Anemia, unspecified: Secondary | ICD-10-CM | POA: Diagnosis present

## 2016-05-09 DIAGNOSIS — R1319 Other dysphagia: Secondary | ICD-10-CM | POA: Diagnosis present

## 2016-05-09 DIAGNOSIS — Z832 Family history of diseases of the blood and blood-forming organs and certain disorders involving the immune mechanism: Secondary | ICD-10-CM

## 2016-05-09 DIAGNOSIS — I25110 Atherosclerotic heart disease of native coronary artery with unstable angina pectoris: Secondary | ICD-10-CM | POA: Diagnosis present

## 2016-05-09 DIAGNOSIS — J9621 Acute and chronic respiratory failure with hypoxia: Secondary | ICD-10-CM | POA: Diagnosis present

## 2016-05-09 DIAGNOSIS — Z794 Long term (current) use of insulin: Secondary | ICD-10-CM

## 2016-05-09 LAB — BASIC METABOLIC PANEL
Anion gap: 11 (ref 5–15)
BUN: 17 mg/dL (ref 6–20)
CALCIUM: 8.9 mg/dL (ref 8.9–10.3)
CO2: 24 mmol/L (ref 22–32)
Chloride: 91 mmol/L — ABNORMAL LOW (ref 101–111)
Creatinine, Ser: 1.1 mg/dL — ABNORMAL HIGH (ref 0.44–1.00)
GFR calc Af Amer: 60 mL/min — ABNORMAL LOW (ref >60)
GFR, EST NON AFRICAN AMERICAN: 52 mL/min — AB (ref >60)
GLUCOSE: 274 mg/dL — AB (ref 65–99)
Potassium: 4.1 mmol/L (ref 3.5–5.1)
Sodium: 126 mmol/L — ABNORMAL LOW (ref 135–145)

## 2016-05-09 LAB — CBC
HEMATOCRIT: 35.9 % — AB (ref 36.0–46.0)
Hemoglobin: 12 g/dL (ref 12.0–15.0)
MCH: 30.4 pg (ref 26.0–34.0)
MCHC: 33.4 g/dL (ref 30.0–36.0)
MCV: 90.9 fL (ref 78.0–100.0)
Platelets: 270 10*3/uL (ref 150–400)
RBC: 3.95 MIL/uL (ref 3.87–5.11)
RDW: 15.7 % — AB (ref 11.5–15.5)
WBC: 20.4 10*3/uL — ABNORMAL HIGH (ref 4.0–10.5)

## 2016-05-09 LAB — I-STAT TROPONIN, ED: Troponin i, poc: 0.86 ng/mL (ref 0.00–0.08)

## 2016-05-09 LAB — BRAIN NATRIURETIC PEPTIDE: B Natriuretic Peptide: 175.5 pg/mL — ABNORMAL HIGH (ref 0.0–100.0)

## 2016-05-09 MED ORDER — CYCLOBENZAPRINE HCL 5 MG PO TABS: 5.0000 mg | ORAL_TABLET | Freq: Three times a day (TID) | ORAL | 3 refills | Status: DC | PRN

## 2016-05-09 MED ORDER — TRAMADOL HCL 50 MG PO TABS: 50.0000 mg | ORAL_TABLET | Freq: Four times a day (QID) | ORAL | 0 refills | Status: DC | PRN

## 2016-05-09 MED ORDER — FENTANYL CITRATE (PF) 100 MCG/2ML IJ SOLN
50.0000 ug | Freq: Once | INTRAMUSCULAR | Status: AC
  Administered 2016-05-09: 50 ug via INTRAVENOUS
  Filled 2016-05-09: qty 2

## 2016-05-09 MED ORDER — IOPAMIDOL (ISOVUE-370) INJECTION 76%
INTRAVENOUS | Status: AC
  Administered 2016-05-09: 80 mL
  Filled 2016-05-09: qty 100

## 2016-05-09 MED ORDER — MORPHINE SULFATE (PF) 4 MG/ML IV SOLN
4.0000 mg | Freq: Once | INTRAVENOUS | Status: AC
  Administered 2016-05-09: 4 mg via INTRAVENOUS
  Filled 2016-05-09: qty 1

## 2016-05-09 NOTE — ED Provider Notes (Signed)
Brenda [name] DEPT Provider Note   CSN: [id number] Arrival date & time: 05/09/16  2009     History   Chief Complaint Chief Complaint  Patient presents with  . Chest Pain    HPI Brenda [name] is a 65 y.o. female.  HPI 65 year old female who presents with chest pain and shortness of breath. She has a history of COPD, on 2 L oxygen at nighttime, coronary artery disease status post PCTA with stent, prior CVA on Plavix, diabetes, DVT no longer on anticoagulation, and peripheral vascular disease status post femoral bypass. States son onset of chest pain at 7 PM this afternoon while at rest. Has had gradually worsening shortness of breath since this time. Pain is described as sharp and stabbing in the center of her chest radiating to the back and is worse whenever she takes a deep breath in. Has noticed that over the past few days has had swelling in the left leg. No cough, fevers or chills. No recent immobilization, but was recently seen in the emergency department at the end of November for back pain and diagnosed with a compression fracture of her thoracic spine.  Prior to arrival to the ED she had 2 sprays of nitroglycerin, a full dose of aspirin  Past Medical History:  Diagnosis Date  . Arthritis    "hands" (11/26/2012)  . CAD S/P percutaneous coronary angioplasty 1999; 2001;2003, 10/2014   a) BMS- mCx; b) re-do PCI for prog of Dz (NIR BMS 3.5 x 12); c) staged PCI: p-mRCA Express II BMS 3.0x24 & 3.5 x 16 --> p-mLAD Cyper DES 3.0 x 13 & PTCA of D2 (2.5 balloon); 10/2015 PCI p-m LAD Promus DES 2.75 x 38 (covers pre&post-stent ISR), POBA of ostial rPDA  . Cancer of base of tongue ([place]) 2010   "& lymph nodes @ right neck" (11/26/2012  . Carotid artery occlusion   . Chronic bronchitis ([place])   . COPD (chronic obstructive pulmonary disease) with emphysema ([place])   . Depression   . GERD (gastroesophageal reflux disease)   . H/O hiatal hernia   . History of blood transfusion   . History of  DVT (deep vein thrombosis)   . Hx of radiation therapy   . Hx of radiation therapy 01/16/09 - 03/06/09   base of tongue, right neck node  . Hypercholesteremia   . Myocardial infarction 1999, 2003  . PAD (peripheral artery disease) ([name])    status post bilateral femoropopliteal bypass grafting --> Dr. Sherren Mocha Early; -  02/2013 Revision of R Fem-AKPop to BK Pop.  . Patent foramen ovale February 2013   Small. Small PFO noted on TTE for TIA/CVA R-L + Bubble study with valsalva  . Pneumonia   . Stroke [facility])    left side weakness remains (11/26/2012)  . Transient ischemic attack (TIA) February 2013  . Type II diabetes mellitus [facility])     Patient Active Problem List   Diagnosis Date Noted  . Constipation 05/06/2016  . Protein-calorie malnutrition, severe 04/05/2016  . COPD with acute exacerbation ([place]) 04/03/2016  . Ulcer of right leg ([place]) 11/09/2015  . Hypotension 10/26/2015  . Angina pectoris ([place]) 10/06/2015  . CAD S/P percutaneous coronary angioplasty   . Acute on chronic respiratory failure with hypoxia ([place]) 10/05/2015  . Protein-calorie malnutrition, moderate ([place]) 10/05/2015  . Depression 10/05/2015  . GERD (gastroesophageal reflux disease) 10/05/2015  . Dysphagia with aspiration 06/02/2015  . Thrush 05/18/2015  . Chronic hypoxemic respiratory failure ([place]) 11/04/2014  . COPD exacerbation ([place])   .  Alcohol abuse   . Pain of left lower extremity- and Right 11/23/2013  . Fatigue 08/23/2013  . Aftercare following surgery of the circulatory system, [place] 02/23/2013  . Peripheral vascular disease, unspecified ([place]) 01/26/2013  . COPD (chronic obstructive pulmonary disease) ([place]) 12/24/2012  . Claudication in peripheral vascular disease ([place]) 11/27/2012  . PVD-Hx of BFBPG '99 and '00. Dopplers OK 6/13   . Cancer of base of tongue- radiation 2010   . Hx of radiation therapy   . DM (diabetes mellitus), type 2, uncontrolled ([place]) 07/23/2011  . TIA (transient ischemic attack)-2013  07/23/2011  . History of CVA (cerebrovascular accident) 07/23/2011  . Tobacco abuse 07/23/2011  . Hyperlipidemia with target LDL less than 70 07/23/2011  . CAD: Stents in p-mRCA, p & m Cx, p-mLAD 02/01/1998    Past Surgical History:  Procedure Laterality Date  . ANKLE FRACTURE SURGERY Right 2007   "got a metal plate and 8 screws in it" (11/26/2012)  . APPENDECTOMY  1962  . BYPASS GRAFT POPLITEAL TO POPLITEAL Right 02/05/2013   Procedure: BYPASS GRAFT ABOVE KNEE POPLITEAL TO BELOW KNEE POPLITEAL WITH SMALL SAPHANOUS VEIN;  Surgeon: Rosetta Posner, MD;  Location: [place];  Service: Vascular;  Laterality: Right;  . CARDIAC CATHETERIZATION  1999; 10/2005   5/'07: mildLAD 20-30%, patent stent, diags- no Dz. RCA 2 BMS patent ~20% ISR.   Marland Kitchen CARDIAC CATHETERIZATION N/A 10/06/2015   Procedure: Left Heart Cath and Coronary Angiography;  Surgeon: Leonie Man, MD;  Location: [place] [facility];  Service: Cardiovascular; p-mLAD 99% pre-stent, 60% ISR & 80% post-stent, o-RPDA 90%, ~10% ISR in 2 RCA (Prox & mid) BMS,~20% ISR in pCx BMS with 5% OM2 BMS.    Marland Kitchen CARDIAC CATHETERIZATION N/A 10/06/2015   Procedure: Coronary Stent Intervention;  Surgeon: Leonie Man, MD;  Location: [place] [facility];  Service: Cardiovascular; p-m LAD Promus DES 2.75 x 38 (tapered post-dilatoin)  . CARDIAC CATHETERIZATION N/A 10/06/2015   Procedure: Coronary Balloon Angioplasty;  Surgeon: Leonie Man, MD;  Location: [place] [facility];  Service: Cardiovascular;  POBA of Ostial rPDA.  Marland Kitchen CARPAL TUNNEL RELEASE Right 1980's?  Marland Kitchen CATARACT EXTRACTION W/ INTRAOCULAR LENS IMPLANT Right 2011  . [place]  . CORONARY ANGIOPLASTY WITH STENT PLACEMENT  1999--2005   1999 - OM2 BMS; 2001: p-mCx NIR BMS 3.5 x 12; 10/2001: p-mRCA Express II BMS - 3.5 x 16 - 3.0 x 24.   . ESOPHAGOGASTRODUODENOSCOPY  12/20/2011   Procedure: ESOPHAGOGASTRODUODENOSCOPY (EGD);  Surgeon: Beryle Beams, MD;  Location: [facility];  Service: Endoscopy;   Laterality: N/A;  EGD with balloon dilation  . EYE SURGERY    . FEMORAL BYPASS  1999; 2000   Bilateral Fem-AK POP bypass.   Marland Kitchen PERIPHERAL VASCULAR CATHETERIZATION N/A 12/01/2015   Procedure: Abdominal Aortogram w/Lower Extremity;  Surgeon: Elam Dutch, MD;  Location: [place] [facility];  Service: Cardiovascular;  Laterality: N/A;  . PERIPHERAL VASCULAR CATHETERIZATION Right 12/22/2015   Procedure: Peripheral Vascular Intervention;  Surgeon: Rosetta Posner, MD;  Location: [place] [facility];  Service: Cardiovascular;  Laterality: Right;  EXternal iliac  . SAVORY DILATION  12/20/2011   Procedure: SAVORY DILATION;  Surgeon: Beryle Beams, MD;  Location: [facility];  Service: Endoscopy;  Laterality: N/A;  . TEE WITHOUT CARDIOVERSION  07/26/2011   Procedure: TRANSESOPHAGEAL ECHOCARDIOGRAM (TEE);  Surgeon: Pixie Casino, MD;  Location[facility] END[facility]COPY;  Service: Cardiovascular;  Laterality: N/A;  . TONSILLECTOMY  1969  .  TRANSTHORACIC ECHOCARDIOGRAM  March 2016   Normal LV size and function EF 55-60%. No regional wall motion and amount is. No significant valvular lesions.  . TUBAL LIGATION  1988  . VAGINAL HYSTERECTOMY  1988    OB History    No data available       Home Medications    Prior to Admission medications   Medication Sig Start Date End Date Taking? Authorizing Provider  acetaminophen (TYLENOL) 325 MG tablet Take 2 tablets (650 mg total) by mouth every 6 (six) hours as needed. 05/01/16   [name], PA-C  albuterol (PROVENTIL HFA;VENTOLIN HFA) 108 (90 BASE) MCG/ACT inhaler Inhale 2 puffs into the lungs every 6 (six) hours as needed for wheezing or shortness of breath. 01/20/15   Belkys A Regalado, MD  aspirin EC 81 MG tablet Take 1 tablet (81 mg total) by mouth daily. 10/07/15   Thurnell Lose, MD  buPROPion (WELLBUTRIN XL) 300 MG 24 hr tablet Take 300 mg by mouth daily.  09/19/15   Historical Provider, MD  clopidogrel (PLAVIX) 75 MG tablet Take 1 tablet (75 mg total) by mouth  daily. 10/07/15   Thurnell Lose, MD  cyclobenzaprine (FLEXERIL) 5 MG tablet Take 1-2 tablets (5-10 mg total) by mouth 3 (three) times daily as needed for muscle spasms. 05/09/16   Leandrew Koyanagi, MD  docusate sodium (COLACE) 100 MG capsule Take 100 mg by mouth daily as needed for mild constipation.     Historical Provider, MD  feeding supplement, GLUCERNA SHAKE, (GLUCERNA SHAKE) LIQD Take 237 mLs by mouth 2 (two) times daily between meals. 04/09/16   Donita Brooks, NP  fish oil-omega-3 fatty acids 1000 MG capsule Take 1 g by mouth 2 (two) times daily.     Historical Provider, MD  fluticasone (FLONASE) 50 MCG/ACT nasal spray Place 2 sprays into both nostrils daily. 06/02/15   Debbe Odea, MD  fluticasone furoate-vilanterol (BREO ELLIPTA) 100-25 MCG/INH AEPB Inhale 1 puff into the lungs daily. 09/13/15   Tammy S Parrett, NP  guaiFENesin (MUCINEX) 600 MG 12 hr tablet Take 1,200 mg by mouth 2 (two) times daily.    Historical Provider, MD  insulin glargine (LANTUS) 100 UNIT/ML injection Inject 5 Units into the skin at bedtime.    Historical Provider, MD  insulin lispro (HUMALOG) 100 UNIT/ML injection Inject 3 Units into the skin 3 (three) times daily with meals as needed for high blood sugar.     Historical Provider, MD  ipratropium-albuterol (DUONEB) 0.5-2.5 (3) MG/3ML SOLN Take 3 mLs by nebulization every 6 (six) hours. Patient taking differently: Take 3 mLs by nebulization 2 (two) times daily as needed (for wheezingt or shortness of breath).  06/02/15   Debbe Odea, MD  loratadine (CLARITIN) 10 MG tablet Take 1 tablet (10 mg total) by mouth daily. 06/02/15   Debbe Odea, MD  Multiple Vitamins-Minerals (MULTIVITAMIN & MINERAL PO) Take 1 tablet by mouth daily.    Historical Provider, MD  neomycin-bacitracin-polymyxin (NEOSPORIN) ointment Apply 1 application topically as needed. For open wound     Historical Provider, MD  nitroGLYCERIN (NITROLINGUAL) 0.4 MG/SPRAY spray Place 1 spray under the tongue every  5 (five) minutes as needed for chest pain. 01/20/15   Belkys A Regalado, MD  omeprazole (PRILOSEC) 20 MG capsule Take 20 mg by mouth daily.     Historical Provider, MD  polyethylene glycol powder (GLYCOLAX/MIRALAX) powder Take 17 g by mouth 2 (two) times daily. 05/01/16   [name], PA-C  rosuvastatin ([place])  20 MG tablet Take 20 mg by mouth daily.    Historical Provider, MD  SPIRIVA RESPIMAT 2.5 MCG/ACT AERS 2 puffs daily. 10/24/15   Historical Provider, MD  traMADol (ULTRAM) 50 MG tablet Take 1 tablet (50 mg total) by mouth every 6 (six) hours as needed for moderate pain or severe pain. 05/09/16   Naip[place] Ephriam Jenkins, MD    Family History Family History  Problem Relation Age of Onset  . Cancer Mother     pancreatic  . Diabetes Mother   . Hyperlipidemia Mother   . Hypertension Mother   . Other Mother     varicose vein  . Cancer Father 72    throat  . Heart disease Father   . Heart attack Father   . Cancer Sister     breast  . Diabetes Sister   . Deep vein thrombosis Brother   . Diabetes Brother   . Hearing loss Brother   . Hyperlipidemia Brother   . Hypertension Brother   . Heart attack Brother   . Clotting disorder Brother   . Diabetes Son   . Hyperlipidemia Son     Social History Social History  Substance Use Topics  . Smoking status: Former Smoker    Packs/day: 1.00    Years: 50.00    Types: Cigarettes    Quit date: 04/03/2016  . Smokeless tobacco: Never Used     Comment: up to 0.5 ppd 02/13/16  . Alcohol use Yes     Comment: occ     Allergies   Tape; Isosorbide; and Nicoderm [nicotine]   Review of Systems Review of Systems 10/14 systems reviewed and are negative other than those stated in the HPI   Physical Exam Updated Vital Signs BP 116/59   Pulse 117   Temp 98.9 F (37.2 C)   Resp 18   LMP  (LMP Unknown)   SpO2 99%   Physical Exam Physical Exam  Nursing note and vitals reviewed. Constitutional: Malnourished appearing, chronically ill  appearing,  non-toxic, and in mild respiratory distress Head: Normocephalic and atraumatic.  Mouth/Throat: Oropharynx is clear and moist.  Neck: Normal range of motion. Neck supple.  Cardiovascular: tachycardic rate and regular rhythm.   Pulmonary/Chest: Tachypnea normal and breath sounds normal.  Abdominal: Soft. There is no tenderness. There is no rebound and no guarding.  Musculoskeletal: Normal range of motion. Nonpitting swelling of the LLE Neurological: Alert, no facial droop, fluent speech, moves all extremities symmetrically Skin: Skin is warm and dry.  Psychiatric: Cooperative   ED Treatments / Results  Labs (all labs ordered are listed, but only abnormal results are displayed) Labs Reviewed  BASIC METABOLIC PANEL - Abnormal; Notable for the following:       Result Value   Sodium 126 (*)    Chloride 91 (*)    Glucose, Bld 274 (*)    Creatinine, Ser 1.10 (*)    GFR calc non Af Amer 52 (*)    GFR calc Af Amer 60 (*)    All other components within normal limits  CBC - Abnormal; Notable for the following:    WBC 20.4 (*)    HCT 35.9 (*)    RDW 15.7 (*)    All other components within normal limits  BRAIN NATRIURETIC PEPTIDE - Abnormal; Notable for the following:    B Natriuretic Peptide 175.5 (*)    All other components within normal limits  I-STAT TROPOININ, ED - Abnormal; Notable for the following:  Troponin i, poc 0.86 (*)    All other components within normal limits  I-STAT CG4 LACTIC ACID, ED    EKG  EKG Interpretation  Date/Time:  Thursday May 09 2016 20:10:32 EST Ventricular Rate:  120 PR Interval:  138 QRS Duration: 74 QT Interval:  316 QTC Calculation: 446 R Axis:   67 Text Interpretation:  Sinus tachycardia Possible Left atrial enlargement Septal infarct , age undetermined Abnormal ECG other than tachycardia, similar to last EKG  Confirmed by Zeppelin Beckstrand MD, Chadric Kimberley [phone]) on 05/09/2016 8:47:42 PM       Radiology Dg Chest 2 View  Result Date:  05/09/2016 CLINICAL DATA:  Dyspnea and chest pain today. EXAM: CHEST  2 VIEW COMPARISON:  04/15/2016 FINDINGS: Diffuse interstitial prominence, worsened. This is particularly evident in the basilar periphery. There is patchy airspace opacity in the right upper lobe inferiorly. No pleural effusions. Hilar and mediastinal contours are unremarkable and unchanged. Baseline emphysematous disease appears severe, upper lobe predominant. IMPRESSION: The findings most likely represent congestive heart failure with interstitial edema and a mild degree of asymmetric alveolar edema in the right upper lobe, superimposed on severe emphysematous disease. Cannot exclude infectious infiltrate. Electronically Signed   By: Andreas Newport M.D.   On: 05/09/2016 21:42   Ct Angio Chest Pe W And/or Wo Contrast  Result Date: 05/10/2016 CLINICAL DATA:  Chest pain and shortness of breath tonight. History of COPD, diabetes, head and neck cancer. EXAM: CT ANGIOGRAPHY CHEST WITH CONTRAST TECHNIQUE: Multidetector CT imaging of the chest was performed using the standard protocol during bolus administration of intravenous contrast. Multiplanar CT image reconstructions and MIPs were obtained to evaluate the vascular anatomy. CONTRAST:  80 cc Isovue 370 COMPARISON:  Chest radiograph May 09, 2016 at 2109 hours and CT chest November 17, 2015 and CT chest May 29, 2015 FINDINGS: CARDIOVASCULAR: Adequate contrast opacification of the pulmonary artery's. Main pulmonary artery is not enlarged. No pulmonary arterial filling defects to the level of the subsegmental branches. Heart size is normal, RIGHT heart strain (RV/ LV 1.3) attributable to patient's emphysema and pneumonia. Severe coronary artery calcifications. No pericardial effusions. Thoracic aorta is normal course and caliber, moderate calcific atherosclerosis of the aortic arch. Probable stenosis LEFT subclavian artery origin, not tailored for evaluation. MEDIASTINUM/NODES: No  lymphadenopathy by CT size criteria. Borderline RIGHT hilar lymphadenopathy is likely reactive. LUNGS/PLEURA: Debris and RIGHT lower lobe bronchus with distal bronchi patchy partially occlusive debris. No pneumothorax. Moderate to severe centrilobular emphysema. Patchy consolidation in the periphery of the RIGHT middle lobe, RIGHT lower lobe. Stable 5 mm RIGHT upper lobe pulmonary nodule. 18 mm spiculated nodular density in LEFT lower lobe, lateral to site of prior 7 mm nodule which are stable. Diffuse interlobular septal thickening. UPPER ABDOMEN: Included view of the abdomen is unremarkable. MUSCULOSKELETAL: Visualized soft tissues and included osseous structures are nonacute. Old anterior rib fractures. Single lumen LEFT humeral head and manubrium. Osteopenia. Review of the MIP images confirms the above findings. IMPRESSION: No acute pulmonary embolism. Multifocal RIGHT greater than LEFT pneumonia, with debris in RIGHT lower lobe bronchus most consistent with aspiration pneumonia. Please note, LEFT lower lobe consolidations is spiculated and, neoplasm not excluded. Recommend 3 month follow-up CT chest to verify improvement. Stable 5 mm RIGHT upper lobe, 7 mm low LEFT lower lobe pulmonary nodules in the setting of moderate to severe emphysema. Electronically Signed   By: Elon Alas M.D.   On: 05/10/2016 00:06    Procedures Procedures (including critical care time)  Medications  Ordered in ED Medications  sodium chloride 0.9 % bolus 1,000 mL (not administered)  vancomycin (VANCOCIN) IVPB 1000 mg/200 mL premix (not administered)  piperacillin-tazobactam (ZOSYN) IVPB 3.375 g (not administered)  fentaNYL (SUBLIMAZE) injection 50 mcg (50 mcg Intravenous Given 05/09/16 2059)  morphine 4 MG/ML injection 4 mg (4 mg Intravenous Given 05/09/16 2131)  iopamidol (ISOVUE-370) 76 % injection (80 mLs  Contrast Given 05/09/16 2306)  sodium chloride 0.9 % bolus 1,000 mL (1,000 mLs Intravenous New Bag/Given 05/10/16  0016)     Initial Impression / Assessment and Plan / ED Course  I have reviewed the triage vital signs and the nursing notes.  Pertinent labs & imaging results that were available during my care of the patient were reviewed by me and considered in my medical decision making (see chart for details).  Clinical Course     65 year old female who presents with chest pain and shortness of breath. She is hypoxic on arrival, with new oxygen requirement. He is also tachycardic with heart rate in the 110s, but normotensive and afebrile. Has unilateral left lower extremity swelling, but otherwise does not appear significantly fluid overloaded. Lungs overall clear to auscultation.  Less consistent with COPD exacerbation. No significant infectious symptoms at home to suggest pneumonia but with leukocytosis of 20, and it is on the differential. Definite concern for PE given her unilateral leg swelling and pleuritic chest pain with prior history of DVT remotely. Pain seems atypical for that of ACS, but she does have elevated troponin of 0.89 but no EKG changes. She received a full dose of aspirin prior to arrival here. Chest x-ray visualized, showing some mild interstitial edema which may also suggest potential CHF.  At this time pending CT PE. CT visualized. No PE or edema. Evidence of multifocal pneumonia. Treated for HCAP with vancomycin and zosyn. Received 2L of fluids. Will admit for hospitalist service.  Final Clinical Impressions(s) / ED Diagnoses   Final diagnoses:  Lobar pneumonia ([place])  Leukocytosis, unspecified type    New Prescriptions New Prescriptions   No medications on file     Forde Dandy, MD 05/10/16 [phone]

## 2016-05-09 NOTE — ED Notes (Signed)
Patient transported to CT 

## 2016-05-09 NOTE — ED Notes (Signed)
Pt placed on 2L [place] of oxygen. Per daughter, pt has oxygen that she wears at night and has been using it more often due to COPD

## 2016-05-09 NOTE — ED Triage Notes (Addendum)
Pt c/o centralized chest pain x 1 hour; denies radiation or sob. Pt had two sprays of nitro and '324mg'$  ASA. Hx of COPD, sats 83% on room air

## 2016-05-09 NOTE — Progress Notes (Signed)
Office Visit Note   Patient: Brenda [name]           Date of Birth: [birth year]           MRN: [id number] Visit Date: 05/09/2016              Requested by: [name], MD [place], [place] [address] PCP: [name], MD   Assessment & Plan: Visit Diagnoses:  1. Spondylosis of lumbar region without myelopathy or radiculopathy     Plan: I reviewed x-rays which showed a subacute to chronic T12 compression fracture without significant height loss. I think. She is more from the lumbar spondylolisthesis and facet disease than anything else. I recommended physical therapy and a lumbar corset also given a prescription for tramadol and Flexeril all see her back as needed  Follow-Up Instructions: Return if symptoms worsen or fail to improve.   Orders:  Orders Placed This Encounter  Procedures  . Ambulatory referral to Physical Therapy   Meds ordered this encounter  Medications  . traMADol (ULTRAM) 50 MG tablet    Sig: Take 1 tablet (50 mg total) by mouth every 6 (six) hours as needed for moderate pain or severe pain.    Dispense:  30 tablet    Refill:  0  . cyclobenzaprine (FLEXERIL) 5 MG tablet    Sig: Take 1-2 tablets (5-10 mg total) by mouth 3 (three) times daily as needed for muscle spasms.    Dispense:  30 tablet    Refill:  3      Procedures: No procedures performed   Clinical Data: No additional findings.   Subjective: Chief Complaint  Patient presents with  . Lower Back - Pain    Brenda [name] is a 65 year old female with low back pain since 05/01/2016. She went to the ER. She denies any injuries. It's been going on for about a week. Pain has gotten worse. The pain is worse with walking standing and sitting. Pain is a constant 8 out of 10 pain. Denies any radiation. Denies any constitutional symptoms.    Review of Systems  Constitutional: Negative.   HENT: Negative.   Eyes: Negative.   Respiratory: Negative.     Cardiovascular: Negative.   Endocrine: Negative.   Musculoskeletal: Negative.   Neurological: Negative.   Hematological: Negative.   Psychiatric/Behavioral: Negative.   All other systems reviewed and are negative.    Objective: Vital Signs: LMP  (LMP Unknown)   Physical Exam  Constitutional: She is oriented to person, place, and time. She appears well-developed and well-nourished.  HENT:  Head: Atraumatic.  Eyes: EOM are normal.  Neck: Neck supple.  Cardiovascular: Intact distal pulses.   Pulmonary/Chest: Effort normal.  Abdominal: Soft.  Neurological: She is alert and oriented to person, place, and time.  Skin: Skin is warm. Capillary refill takes less than 2 seconds.  Psychiatric: She has a normal mood and affect. Her behavior is normal. Judgment and thought content normal.  Nursing note and vitals reviewed.   Ortho Exam Exam of the lumbar spine shows that she is actually tender in the lumbar segments and not over T12. She does have significant kyphosis of her thoracic spine. She has no focal motor or sensory deficits. Symmetric reflexes. Specialty Comments:  No specialty comments available.  Imaging: No results found.   PMFS History: Patient Active Problem List   Diagnosis Date Noted  . Constipation 05/06/2016  . Protein-calorie malnutrition, severe 04/05/2016  . COPD with acute  exacerbation ([place]) 04/03/2016  . Ulcer of right leg ([place]) 11/09/2015  . Hypotension 10/26/2015  . Angina pectoris ([place]) 10/06/2015  . CAD S/P percutaneous coronary angioplasty   . Acute on chronic respiratory failure with hypoxia ([place]) 10/05/2015  . Protein-calorie malnutrition, moderate ([place]) 10/05/2015  . Depression 10/05/2015  . GERD (gastroesophageal reflux disease) 10/05/2015  . Dysphagia with aspiration 06/02/2015  . Thrush 05/18/2015  . Chronic hypoxemic respiratory failure ([place]) 11/04/2014  . COPD exacerbation ([place])   . Alcohol abuse   . Pain of left lower extremity- and  Right 11/23/2013  . Fatigue 08/23/2013  . Aftercare following surgery of the circulatory system, [place] 02/23/2013  . Peripheral vascular disease, unspecified ([place]) 01/26/2013  . COPD (chronic obstructive pulmonary disease) ([place]) 12/24/2012  . Claudication in peripheral vascular disease ([place]) 11/27/2012  . PVD-Hx of BFBPG '99 and '00. Dopplers OK 6/13   . Cancer of base of tongue- radiation 2010   . Hx of radiation therapy   . DM (diabetes mellitus), type 2, uncontrolled ([place]) 07/23/2011  . TIA (transient ischemic attack)-2013 07/23/2011  . History of CVA (cerebrovascular accident) 07/23/2011  . Tobacco abuse 07/23/2011  . Hyperlipidemia with target LDL less than 70 07/23/2011  . CAD: Stents in p-mRCA, p & m Cx, p-mLAD 02/01/1998   Past Medical History:  Diagnosis Date  . Arthritis    "hands" (11/26/2012)  . CAD S/P percutaneous coronary angioplasty 1999; 2001;2003, 10/2014   a) BMS- mCx; b) re-do PCI for prog of Dz (NIR BMS 3.5 x 12); c) staged PCI: p-mRCA Express II BMS 3.0x24 & 3.5 x 16 --> p-mLAD Cyper DES 3.0 x 13 & PTCA of D2 (2.5 balloon); 10/2015 PCI p-m LAD Promus DES 2.75 x 38 (covers pre&post-stent ISR), POBA of ostial rPDA  . Cancer of base of tongue ([place]) 2010   "& lymph nodes @ right neck" (11/26/2012  . Carotid artery occlusion   . Chronic bronchitis ([place])   . COPD (chronic obstructive pulmonary disease) with emphysema ([place])   . Depression   . GERD (gastroesophageal reflux disease)   . H/O hiatal hernia   . History of blood transfusion   . History of DVT (deep vein thrombosis)   . Hx of radiation therapy   . Hx of radiation therapy 01/16/09 - 03/06/09   base of tongue, right neck node  . Hypercholesteremia   . Myocardial infarction 1999, 2003  . PAD (peripheral artery disease) ([name])    status post bilateral femoropopliteal bypass grafting --> Dr. Sherren Mocha Early; -  02/2013 Revision of R Fem-AKPop to BK Pop.  . Patent foramen ovale February 2013   Small. Small PFO noted on TTE  for TIA/CVA R-L + Bubble study with valsalva  . Pneumonia   . Stroke [facility])    left side weakness remains (11/26/2012)  . Transient ischemic attack (TIA) February 2013  . Type II diabetes mellitus ([name])     Family History  Problem Relation Age of Onset  . Cancer Mother     pancreatic  . Diabetes Mother   . Hyperlipidemia Mother   . Hypertension Mother   . Other Mother     varicose vein  . Cancer Father 72    throat  . Heart disease Father   . Heart attack Father   . Cancer Sister     breast  . Diabetes Sister   . Deep vein thrombosis Brother   . Diabetes Brother   . Hearing loss Brother   . Hyperlipidemia Brother   .  Hypertension Brother   . Heart attack Brother   . Clotting disorder Brother   . Diabetes Son   . Hyperlipidemia Son     Past Surgical History:  Procedure Laterality Date  . ANKLE FRACTURE SURGERY Right 2007   "got a metal plate and 8 screws in it" (11/26/2012)  . APPENDECTOMY  1962  . BYPASS GRAFT POPLITEAL TO POPLITEAL Right 02/05/2013   Procedure: BYPASS GRAFT ABOVE KNEE POPLITEAL TO BELOW KNEE POPLITEAL WITH SMALL SAPHANOUS VEIN;  Surgeon: Rosetta Posner, MD;  Location: [facility];  Service: Vascular;  Laterality: Right;  . CARDIAC CATHETERIZATION  1999; 10/2005   5/'07: mildLAD 20-30%, patent stent, diags- no Dz. RCA 2 BMS patent ~20% ISR.   Marland Kitchen CARDIAC CATHETERIZATION N/A 10/06/2015   Procedure: Left Heart Cath and Coronary Angiography;  Surgeon: Leonie Man, MD;  Location: [facility];  Service: Cardiovascular; p-mLAD 99% pre-stent, 60% ISR & 80% post-stent, o-RPDA 90%, ~10% ISR in 2 RCA (Prox & mid) BMS,~20% ISR in pCx BMS with 5% OM2 BMS.    Marland Kitchen CARDIAC CATHETERIZATION N/A 10/06/2015   Procedure: Coronary Stent Intervention;  Surgeon: Leonie Man, MD;  Location: [facility];  Service: Cardiovascular; p-m LAD Promus DES 2.75 x 38 (tapered post-dilatoin)  . CARDIAC CATHETERIZATION N/A 10/06/2015   Procedure: Coronary Balloon Angioplasty;  Surgeon:  Leonie Man, MD;  Location: [facility];  Service: Cardiovascular;  POBA of Ostial rPDA.  Marland Kitchen CARPAL TUNNEL RELEASE Right 1980's?  Marland Kitchen CATARACT EXTRACTION W/ INTRAOCULAR LENS IMPLANT Right 2011  . [place]  . CORONARY ANGIOPLASTY WITH STENT PLACEMENT  1999--2005   1999 - OM2 BMS; 2001: p-mCx NIR BMS 3.5 x 12; 10/2001: p-mRCA Express II BMS - 3.5 x 16 - 3.0 x 24.   . ESOPHAGOGASTRODUODENOSCOPY  12/20/2011   Procedure: ESOPHAGOGASTRODUODENOSCOPY (EGD);  Surgeon: Beryle Beams, MD;  Location: [facility];  Service: Endoscopy;  Laterality: N/A;  EGD with balloon dilation  . EYE SURGERY    . FEMORAL BYPASS  1999; 2000   Bilateral Fem-AK POP bypass.   Marland Kitchen PERIPHERAL VASCULAR CATHETERIZATION N/A 12/01/2015   Procedure: Abdominal Aortogram w/Lower Extremity;  Surgeon: Elam Dutch, MD;  Location: [facility];  Service: Cardiovascular;  Laterality: N/A;  . PERIPHERAL VASCULAR CATHETERIZATION Right 12/22/2015   Procedure: Peripheral Vascular Intervention;  Surgeon: Rosetta Posner, MD;  Location: [facility];  Service: Cardiovascular;  Laterality: Right;  EXternal iliac  . SAVORY DILATION  12/20/2011   Procedure: SAVORY DILATION;  Surgeon: Beryle Beams, MD;  Location: [facility];  Service: Endoscopy;  Laterality: N/A;  . TEE WITHOUT CARDIOVERSION  07/26/2011   Procedure: TRANSESOPHAGEAL ECHOCARDIOGRAM (TEE);  Surgeon: Pixie Casino, MD;  [facility];  Service: Cardiovascular;  Laterality: N/A;  . TONSILLECTOMY  1969  . TRANSTHORACIC ECHOCARDIOGRAM  March 2016   Normal LV size and function EF 55-60%. No regional wall motion and amount is. No significant valvular lesions.  . TUBAL LIGATION  1988  . VAGINAL HYSTERECTOMY  1988   Social History   Occupational History  . house wife    Social History Main Topics  . Smoking status: Former Smoker    Packs/day: 1.00    Years: 50.00    Types: Cigarettes    Quit date: 04/03/2016  . Smokeless tobacco: Never  Used     Comment: up to 0.5 ppd 02/13/16  . Alcohol use Yes  Comment: occ  . Drug use: No  . Sexual activity: Not on file

## 2016-05-10 ENCOUNTER — Encounter (HOSPITAL_COMMUNITY): Payer: Self-pay | Admitting: Family Medicine

## 2016-05-10 DIAGNOSIS — I25119 Atherosclerotic heart disease of native coronary artery with unspecified angina pectoris: Secondary | ICD-10-CM | POA: Diagnosis not present

## 2016-05-10 DIAGNOSIS — I959 Hypotension, unspecified: Secondary | ICD-10-CM

## 2016-05-10 DIAGNOSIS — E871 Hypo-osmolality and hyponatremia: Secondary | ICD-10-CM | POA: Diagnosis present

## 2016-05-10 DIAGNOSIS — I252 Old myocardial infarction: Secondary | ICD-10-CM | POA: Diagnosis not present

## 2016-05-10 DIAGNOSIS — R0602 Shortness of breath: Secondary | ICD-10-CM | POA: Diagnosis present

## 2016-05-10 DIAGNOSIS — Z923 Personal history of irradiation: Secondary | ICD-10-CM | POA: Diagnosis not present

## 2016-05-10 DIAGNOSIS — E1165 Type 2 diabetes mellitus with hyperglycemia: Secondary | ICD-10-CM | POA: Diagnosis present

## 2016-05-10 DIAGNOSIS — J9621 Acute and chronic respiratory failure with hypoxia: Secondary | ICD-10-CM | POA: Diagnosis present

## 2016-05-10 DIAGNOSIS — Z833 Family history of diabetes mellitus: Secondary | ICD-10-CM | POA: Diagnosis not present

## 2016-05-10 DIAGNOSIS — I248 Other forms of acute ischemic heart disease: Secondary | ICD-10-CM | POA: Diagnosis present

## 2016-05-10 DIAGNOSIS — R1312 Dysphagia, oropharyngeal phase: Secondary | ICD-10-CM

## 2016-05-10 DIAGNOSIS — I200 Unstable angina: Secondary | ICD-10-CM | POA: Diagnosis not present

## 2016-05-10 DIAGNOSIS — R1319 Other dysphagia: Secondary | ICD-10-CM | POA: Diagnosis present

## 2016-05-10 DIAGNOSIS — Z87891 Personal history of nicotine dependence: Secondary | ICD-10-CM | POA: Diagnosis not present

## 2016-05-10 DIAGNOSIS — Z794 Long term (current) use of insulin: Secondary | ICD-10-CM

## 2016-05-10 DIAGNOSIS — J441 Chronic obstructive pulmonary disease with (acute) exacerbation: Secondary | ICD-10-CM

## 2016-05-10 DIAGNOSIS — E440 Moderate protein-calorie malnutrition: Secondary | ICD-10-CM

## 2016-05-10 DIAGNOSIS — R1310 Dysphagia, unspecified: Secondary | ICD-10-CM

## 2016-05-10 DIAGNOSIS — A419 Sepsis, unspecified organism: Secondary | ICD-10-CM | POA: Diagnosis present

## 2016-05-10 DIAGNOSIS — Z86718 Personal history of other venous thrombosis and embolism: Secondary | ICD-10-CM | POA: Diagnosis not present

## 2016-05-10 DIAGNOSIS — J181 Lobar pneumonia, unspecified organism: Secondary | ICD-10-CM | POA: Diagnosis not present

## 2016-05-10 DIAGNOSIS — Z832 Family history of diseases of the blood and blood-forming organs and certain disorders involving the immune mechanism: Secondary | ICD-10-CM | POA: Diagnosis not present

## 2016-05-10 DIAGNOSIS — I25110 Atherosclerotic heart disease of native coronary artery with unstable angina pectoris: Secondary | ICD-10-CM | POA: Diagnosis present

## 2016-05-10 DIAGNOSIS — E7800 Pure hypercholesterolemia, unspecified: Secondary | ICD-10-CM | POA: Diagnosis present

## 2016-05-10 DIAGNOSIS — J690 Pneumonitis due to inhalation of food and vomit: Secondary | ICD-10-CM | POA: Diagnosis present

## 2016-05-10 DIAGNOSIS — I950 Idiopathic hypotension: Secondary | ICD-10-CM | POA: Diagnosis not present

## 2016-05-10 DIAGNOSIS — Z8249 Family history of ischemic heart disease and other diseases of the circulatory system: Secondary | ICD-10-CM | POA: Diagnosis not present

## 2016-05-10 DIAGNOSIS — E1121 Type 2 diabetes mellitus with diabetic nephropathy: Secondary | ICD-10-CM

## 2016-05-10 DIAGNOSIS — J440 Chronic obstructive pulmonary disease with acute lower respiratory infection: Secondary | ICD-10-CM | POA: Diagnosis present

## 2016-05-10 DIAGNOSIS — Z8673 Personal history of transient ischemic attack (TIA), and cerebral infarction without residual deficits: Secondary | ICD-10-CM | POA: Diagnosis not present

## 2016-05-10 DIAGNOSIS — E861 Hypovolemia: Secondary | ICD-10-CM | POA: Diagnosis present

## 2016-05-10 DIAGNOSIS — J189 Pneumonia, unspecified organism: Secondary | ICD-10-CM

## 2016-05-10 DIAGNOSIS — E1151 Type 2 diabetes mellitus with diabetic peripheral angiopathy without gangrene: Secondary | ICD-10-CM | POA: Diagnosis present

## 2016-05-10 DIAGNOSIS — K219 Gastro-esophageal reflux disease without esophagitis: Secondary | ICD-10-CM | POA: Diagnosis present

## 2016-05-10 LAB — COMPREHENSIVE METABOLIC PANEL
ALT: 16 U/L (ref 14–54)
AST: 20 U/L (ref 15–41)
Albumin: 2.3 g/dL — ABNORMAL LOW (ref 3.5–5.0)
Alkaline Phosphatase: 175 U/L — ABNORMAL HIGH (ref 38–126)
Anion gap: 8 (ref 5–15)
BUN: 16 mg/dL (ref 6–20)
CHLORIDE: 99 mmol/L — AB (ref 101–111)
CO2: 24 mmol/L (ref 22–32)
CREATININE: 0.87 mg/dL (ref 0.44–1.00)
Calcium: 7.7 mg/dL — ABNORMAL LOW (ref 8.9–10.3)
GFR calc non Af Amer: >60 mL/min (ref >60)
Glucose, Bld: 258 mg/dL — ABNORMAL HIGH (ref 65–99)
Potassium: 4.1 mmol/L (ref 3.5–5.1)
SODIUM: 131 mmol/L — AB (ref 135–145)
Total Bilirubin: 1.7 mg/dL — ABNORMAL HIGH (ref 0.3–1.2)
Total Protein: 5 g/dL — ABNORMAL LOW (ref 6.5–8.1)

## 2016-05-10 LAB — CBC
HEMATOCRIT: 31.9 % — AB (ref 36.0–46.0)
HEMOGLOBIN: 10.5 g/dL — AB (ref 12.0–15.0)
MCH: 29.7 pg (ref 26.0–34.0)
MCHC: 32.9 g/dL (ref 30.0–36.0)
MCV: 90.4 fL (ref 78.0–100.0)
PLATELETS: 210 10*3/uL (ref 150–400)
RBC: 3.53 MIL/uL — AB (ref 3.87–5.11)
RDW: 15.5 % (ref 11.5–15.5)
WBC: 14.1 10*3/uL — ABNORMAL HIGH (ref 4.0–10.5)

## 2016-05-10 LAB — SODIUM, URINE, RANDOM: SODIUM UR: 49 mmol/L

## 2016-05-10 LAB — I-STAT CG4 LACTIC ACID, ED: LACTIC ACID, VENOUS: 0.85 mmol/L (ref 0.5–1.9)

## 2016-05-10 LAB — GLUCOSE, CAPILLARY
GLUCOSE-CAPILLARY: 214 mg/dL — AB (ref 65–99)
GLUCOSE-CAPILLARY: 211 mg/dL — AB (ref 65–99)
GLUCOSE-CAPILLARY: 180 mg/dL — AB (ref 65–99)
Glucose-Capillary: 258 mg/dL — ABNORMAL HIGH (ref 65–99)
Glucose-Capillary: 214 mg/dL — ABNORMAL HIGH (ref 65–99)

## 2016-05-10 LAB — TROPONIN I
Troponin I: 0.78 ng/mL (ref <0.03)
Troponin I: 0.69 ng/mL (ref <0.03)
Troponin I: 0.73 ng/mL (ref <0.03)

## 2016-05-10 LAB — PROCALCITONIN: PROCALCITONIN: 0.55 ng/mL

## 2016-05-10 LAB — OSMOLALITY, URINE: OSMOLALITY UR: 652 mosm/kg (ref 300–900)

## 2016-05-10 LAB — HEPARIN LEVEL (UNFRACTIONATED): Heparin Unfractionated: 0.36 IU/mL (ref 0.30–0.70)

## 2016-05-10 LAB — MRSA PCR SCREENING: MRSA BY PCR: NEGATIVE

## 2016-05-10 LAB — OSMOLALITY: OSMOLALITY: 287 mosm/kg (ref 275–295)

## 2016-05-10 LAB — STREP PNEUMONIAE URINARY ANTIGEN: Strep Pneumo Urinary Antigen: NEGATIVE

## 2016-05-10 MED ORDER — VANCOMYCIN HCL IN DEXTROSE 1-5 GM/200ML-% IV SOLN
1000.0000 mg | INTRAVENOUS | Status: DC
  Filled 2016-05-10: qty 200

## 2016-05-10 MED ORDER — ALBUTEROL SULFATE (2.5 MG/3ML) 0.083% IN NEBU
2.5000 mg | INHALATION_SOLUTION | Freq: Four times a day (QID) | RESPIRATORY_TRACT | Status: DC
  Administered 2016-05-10: 2.5 mg via RESPIRATORY_TRACT
  Filled 2016-05-10: qty 3

## 2016-05-10 MED ORDER — GUAIFENESIN ER 600 MG PO TB12
1200.0000 mg | ORAL_TABLET | Freq: Two times a day (BID) | ORAL | Status: DC
  Administered 2016-05-10 – 2016-05-14 (×10): 1200 mg via ORAL
  Filled 2016-05-10 (×12): qty 2

## 2016-05-10 MED ORDER — SODIUM CHLORIDE 0.9 % IV BOLUS (SEPSIS)
500.0000 mL | Freq: Once | INTRAVENOUS | Status: AC
  Administered 2016-05-10: 500 mL via INTRAVENOUS

## 2016-05-10 MED ORDER — CYCLOBENZAPRINE HCL 10 MG PO TABS
5.0000 mg | ORAL_TABLET | Freq: Three times a day (TID) | ORAL | Status: DC | PRN
  Administered 2016-05-12 – 2016-05-13 (×3): 10 mg via ORAL
  Filled 2016-05-10 (×3): qty 1

## 2016-05-10 MED ORDER — PREDNISONE 20 MG PO TABS
40.0000 mg | ORAL_TABLET | Freq: Every day | ORAL | Status: AC
  Administered 2016-05-10 – 2016-05-14 (×5): 40 mg via ORAL
  Filled 2016-05-10 (×5): qty 2

## 2016-05-10 MED ORDER — DEXTROSE 5 % IV SOLN
1.0000 g | Freq: Two times a day (BID) | INTRAVENOUS | Status: DC
  Administered 2016-05-10: 1 g via INTRAVENOUS
  Filled 2016-05-10 (×2): qty 1

## 2016-05-10 MED ORDER — FLUTICASONE FUROATE-VILANTEROL 100-25 MCG/INH IN AEPB
1.0000 | INHALATION_SPRAY | Freq: Every day | RESPIRATORY_TRACT | Status: DC
  Administered 2016-05-10 – 2016-05-14 (×5): 1 via RESPIRATORY_TRACT
  Filled 2016-05-10: qty 28

## 2016-05-10 MED ORDER — PIPERACILLIN-TAZOBACTAM 3.375 G IVPB 30 MIN: 3.3750 g | Freq: Three times a day (TID) | INTRAVENOUS | Status: DC

## 2016-05-10 MED ORDER — ASPIRIN EC 81 MG PO TBEC
81.0000 mg | DELAYED_RELEASE_TABLET | Freq: Every day | ORAL | Status: DC
  Administered 2016-05-10 – 2016-05-14 (×5): 81 mg via ORAL
  Filled 2016-05-10 (×5): qty 1

## 2016-05-10 MED ORDER — ONDANSETRON HCL 4 MG/2ML IJ SOLN
4.0000 mg | Freq: Four times a day (QID) | INTRAMUSCULAR | Status: DC | PRN
  Administered 2016-05-13: 4 mg via INTRAVENOUS
  Filled 2016-05-10 (×2): qty 2

## 2016-05-10 MED ORDER — ACETAMINOPHEN 325 MG PO TABS
650.0000 mg | ORAL_TABLET | Freq: Four times a day (QID) | ORAL | Status: DC | PRN
  Administered 2016-05-12 – 2016-05-13 (×4): 650 mg via ORAL
  Filled 2016-05-10 (×3): qty 2

## 2016-05-10 MED ORDER — TIOTROPIUM BROMIDE MONOHYDRATE 18 MCG IN CAPS
18.0000 ug | ORAL_CAPSULE | Freq: Every day | RESPIRATORY_TRACT | Status: DC
  Filled 2016-05-10: qty 5

## 2016-05-10 MED ORDER — IPRATROPIUM-ALBUTEROL 0.5-2.5 (3) MG/3ML IN SOLN
3.0000 mL | Freq: Four times a day (QID) | RESPIRATORY_TRACT | Status: DC
Start: 2016-05-10 — End: 2016-05-11
  Administered 2016-05-10 (×2): 3 mL via RESPIRATORY_TRACT
  Filled 2016-05-10 (×2): qty 3

## 2016-05-10 MED ORDER — CLOPIDOGREL BISULFATE 75 MG PO TABS
75.0000 mg | ORAL_TABLET | Freq: Every day | ORAL | Status: DC
Start: 2016-05-10 — End: 2016-05-14
  Administered 2016-05-10 – 2016-05-14 (×5): 75 mg via ORAL
  Filled 2016-05-10 (×5): qty 1

## 2016-05-10 MED ORDER — BUPROPION HCL ER (XL) 150 MG PO TB24
300.0000 mg | ORAL_TABLET | Freq: Every day | ORAL | Status: DC
  Administered 2016-05-10 – 2016-05-14 (×5): 300 mg via ORAL
  Filled 2016-05-10 (×2): qty 2
  Filled 2016-05-10 (×3): qty 1

## 2016-05-10 MED ORDER — VANCOMYCIN HCL IN DEXTROSE 1-5 GM/200ML-% IV SOLN
1000.0000 mg | Freq: Once | INTRAVENOUS | Status: AC
  Administered 2016-05-10: 1000 mg via INTRAVENOUS
  Filled 2016-05-10: qty 200

## 2016-05-10 MED ORDER — SODIUM CHLORIDE 0.9 % IV BOLUS (SEPSIS)
1000.0000 mL | Freq: Once | INTRAVENOUS | Status: AC
  Administered 2016-05-10: 1000 mL via INTRAVENOUS

## 2016-05-10 MED ORDER — PIPERACILLIN-TAZOBACTAM 3.375 G IVPB
3.3750 g | Freq: Three times a day (TID) | INTRAVENOUS | Status: DC
  Administered 2016-05-10 – 2016-05-12 (×7): 3.375 g via INTRAVENOUS
  Filled 2016-05-10 (×9): qty 50

## 2016-05-10 MED ORDER — ENOXAPARIN SODIUM 40 MG/0.4ML ~~LOC~~ SOLN
40.0000 mg | Freq: Every day | SUBCUTANEOUS | Status: DC
  Administered 2016-05-10: 40 mg via SUBCUTANEOUS
  Filled 2016-05-10: qty 0.4

## 2016-05-10 MED ORDER — ROSUVASTATIN CALCIUM 20 MG PO TABS
20.0000 mg | ORAL_TABLET | Freq: Every day | ORAL | Status: DC
  Administered 2016-05-10 – 2016-05-14 (×5): 20 mg via ORAL
  Filled 2016-05-10: qty 1
  Filled 2016-05-10 (×2): qty 2
  Filled 2016-05-10: qty 1
  Filled 2016-05-10: qty 2

## 2016-05-10 MED ORDER — SODIUM CHLORIDE 0.9 % IV SOLN
INTRAVENOUS | Status: DC
  Administered 2016-05-10 (×3): via INTRAVENOUS

## 2016-05-10 MED ORDER — HEPARIN (PORCINE) IN NACL 100-0.45 UNIT/ML-% IJ SOLN
1350.0000 [IU]/h | INTRAMUSCULAR | Status: DC
  Administered 2016-05-10: 700 [IU]/h via INTRAVENOUS
  Administered 2016-05-11: 1050 [IU]/h via INTRAVENOUS
  Filled 2016-05-10 (×2): qty 250

## 2016-05-10 MED ORDER — INSULIN GLARGINE 100 UNIT/ML ~~LOC~~ SOLN
5.0000 [IU] | Freq: Every day | SUBCUTANEOUS | Status: DC
  Administered 2016-05-10 (×2): 5 [IU] via SUBCUTANEOUS
  Filled 2016-05-10 (×3): qty 0.05

## 2016-05-10 MED ORDER — GLUCERNA SHAKE PO LIQD
237.0000 mL | Freq: Two times a day (BID) | ORAL | Status: DC
  Administered 2016-05-12: 237 mL via ORAL

## 2016-05-10 MED ORDER — ONDANSETRON HCL 4 MG PO TABS: 4.0000 mg | ORAL_TABLET | Freq: Four times a day (QID) | ORAL | Status: DC | PRN

## 2016-05-10 MED ORDER — POLYETHYLENE GLYCOL 3350 17 G PO PACK
17.0000 g | PACK | Freq: Two times a day (BID) | ORAL | Status: DC
  Administered 2016-05-10 – 2016-05-14 (×7): 17 g via ORAL
  Filled 2016-05-10 (×9): qty 1

## 2016-05-10 MED ORDER — PIPERACILLIN-TAZOBACTAM 3.375 G IVPB 30 MIN
3.3750 g | Freq: Once | INTRAVENOUS | Status: AC
  Administered 2016-05-10: 3.375 g via INTRAVENOUS
  Filled 2016-05-10: qty 50

## 2016-05-10 MED ORDER — SODIUM CHLORIDE 0.9% FLUSH
3.0000 mL | Freq: Two times a day (BID) | INTRAVENOUS | Status: DC
  Administered 2016-05-10 – 2016-05-14 (×7): 3 mL via INTRAVENOUS

## 2016-05-10 MED ORDER — INSULIN ASPART 100 UNIT/ML ~~LOC~~ SOLN
0.0000 [IU] | Freq: Three times a day (TID) | SUBCUTANEOUS | Status: DC
  Administered 2016-05-10: 5 [IU] via SUBCUTANEOUS
  Administered 2016-05-10 – 2016-05-11 (×3): 3 [IU] via SUBCUTANEOUS
  Administered 2016-05-11: 7 [IU] via SUBCUTANEOUS
  Administered 2016-05-11: 5 [IU] via SUBCUTANEOUS
  Administered 2016-05-12 (×2): 3 [IU] via SUBCUTANEOUS
  Administered 2016-05-12: 2 [IU] via SUBCUTANEOUS
  Administered 2016-05-13: 1 [IU] via SUBCUTANEOUS
  Administered 2016-05-13: 2 [IU] via SUBCUTANEOUS
  Administered 2016-05-13: 3 [IU] via SUBCUTANEOUS
  Administered 2016-05-14: 1 [IU] via SUBCUTANEOUS

## 2016-05-10 MED ORDER — ORAL CARE MOUTH RINSE
15.0000 mL | Freq: Two times a day (BID) | OROMUCOSAL | Status: DC
  Administered 2016-05-10 – 2016-05-13 (×6): 15 mL via OROMUCOSAL

## 2016-05-10 MED ORDER — ALBUTEROL SULFATE (2.5 MG/3ML) 0.083% IN NEBU
2.5000 mg | INHALATION_SOLUTION | RESPIRATORY_TRACT | Status: DC | PRN
  Administered 2016-05-11: 2.5 mg via RESPIRATORY_TRACT
  Filled 2016-05-10: qty 3

## 2016-05-10 MED ORDER — TRAMADOL HCL 50 MG PO TABS
50.0000 mg | ORAL_TABLET | Freq: Four times a day (QID) | ORAL | Status: DC | PRN
  Administered 2016-05-10 – 2016-05-14 (×3): 50 mg via ORAL
  Filled 2016-05-10 (×3): qty 1

## 2016-05-10 MED ORDER — INSULIN ASPART 100 UNIT/ML ~~LOC~~ SOLN
0.0000 [IU] | Freq: Every day | SUBCUTANEOUS | Status: DC
  Administered 2016-05-11: 4 [IU] via SUBCUTANEOUS
  Administered 2016-05-12 – 2016-05-13 (×2): 2 [IU] via SUBCUTANEOUS

## 2016-05-10 MED ORDER — ACETAMINOPHEN 650 MG RE SUPP: 650.0000 mg | Freq: Four times a day (QID) | RECTAL | Status: DC | PRN

## 2016-05-10 MED ORDER — PANTOPRAZOLE SODIUM 40 MG PO TBEC
40.0000 mg | DELAYED_RELEASE_TABLET | Freq: Every day | ORAL | Status: DC
  Administered 2016-05-10 – 2016-05-14 (×5): 40 mg via ORAL
  Filled 2016-05-10 (×5): qty 1

## 2016-05-10 NOTE — Progress Notes (Addendum)
Inpatient Diabetes Program Recommendations  AACE/ADA: New Consensus Statement on Inpatient Glycemic Control (2015)  Target Ranges:  Prepandial:   less than 140 mg/dL      Peak postprandial:   less than 180 mg/dL (1-2 hours)      Critically ill patients:  140 - 180 mg/dL   Lab Results  Component Value Date   GLUCAP 214 (H) 05/10/2016   HGBA1C 8.9 (H) 10/06/2015    Review of Glycemic Control  Results for Brenda [name], Brenda [name] (MRN [id number]) as of 05/10/2016 11:19  Ref. Range 05/10/2016 03:09  Glucose-Capillary Latest Ref Range: 65 - 99 mg/dL 214 (H)    Diabetes history: Type 2 Outpatient Diabetes medications: Lantus 5 units qhs, Humalog 3units tid Current orders for Inpatient glycemic control: Lantus 5 units qday, Novolog 0-9 units tid, Novolog 0-5 units qhs  Inpatient Diabetes Program Recommendations: Since the patient is NPO and on steroids, please consider changing her to Novolog 0-15 units q4h.  Fasting blood sugar elevated- consider increasing Lantus 12 units qhs (0.2units/kg).  Gentry Fitz, RN, BA, MHA, CDE Diabetes Coordinator Inpatient Diabetes Program  [phone] (Team Pager) [phone] ([place]) 05/10/2016 11:28 AM

## 2016-05-10 NOTE — Progress Notes (Signed)
CRITICAL VALUE ALERT  Critical value received:  Troponin 0.78  Date of notification:  05/10/16  Time of notification:  2751   Critical value read back: yes  Nurse who received alert:  Atilano Ina, RN  MD notified (1st page):  Baltazar Najjar  Time of first page:  [phone]

## 2016-05-10 NOTE — Consult Note (Signed)
Reason for Consult:  Chest pain and SOB  Referring Physician: Dr. Carles Collet   PCP:  [name], MD  Primary Cardiologist:Dr. Carmon Sails is an 65 y.o. female.    Chief Complaint: chest pain admitted 05/10/16   HPI: Asked to see 19 yof with past medical history of HLD, DM, COPD on nighttime 3L O2, long-standing tobacco abuse, CAD, PAD s/p bil femoropopliteal 1999/2000 with revision of right femoropopliteal in 2014, TIA/CVA, and history of carotid artery disease  Last cath was 10/06/15 with pLAD t mLAD stenosis, 60% instent stenosis of prior LAD stent and 80% distal to stent stenosis.  Pt ha PTCA and stent placed, one to cover all three areas.  Also 90% Rt PDA stensosis with POBA alone.  Previous stents to RCA were patent along with previous LCX stents.  Also hx of tongue cancer s/p rad tx c/b dysphagia and recurrent aspiration pneumonia,   Now admitted after presentation to ER with chest pain on 05/09/16 she had used NTG spray X 2 without relief and taken ASA.  Her Sats on RA were 83%.  EKG ST at 120 no acute changes except increased HR.  CXR with patchy bil. Opacities worse RLL. CT chest with no PE + multifocal pna.  Also suspicious LLL node  Troponin 0.86 and follow up troponin I 0.78; 0.69 Na on admit 124 now 131 WBC on admit 20 now 14.  hgb with mild anemia 10.5,  Glucose elevated  CURRENTLY still with chest pain.  Midsternal pressure - was similar to May prior to stents.   She is on IV heparin. Her pain pills have helped the pain somewhat but it continues and is more constant.  NTG without relief.   Past Medical History:  Diagnosis Date  . Arthritis    "hands" (11/26/2012)  . CAD S/P percutaneous coronary angioplasty 1999; 2001;2003, 10/2014   a) BMS- mCx; b) re-do PCI for prog of Dz (NIR BMS 3.5 x 12); c) staged PCI: p-mRCA Express II BMS 3.0x24 & 3.5 x 16 --> p-mLAD Cyper DES 3.0 x 13 & PTCA of D2 (2.5 balloon); 10/2015 PCI p-m LAD Promus DES 2.75 x 38 (covers  pre&post-stent ISR), POBA of ostial rPDA  . Cancer of base of tongue ([place]) 2010   "& lymph nodes @ right neck" (11/26/2012  . Carotid artery occlusion   . Chronic bronchitis ([place])   . COPD (chronic obstructive pulmonary disease) with emphysema ([place])   . Depression   . GERD (gastroesophageal reflux disease)   . H/O hiatal hernia   . History of blood transfusion   . History of DVT (deep vein thrombosis)   . Hx of radiation therapy   . Hx of radiation therapy 01/16/09 - 03/06/09   base of tongue, right neck node  . Hypercholesteremia   . Myocardial infarction 1999, 2003  . PAD (peripheral artery disease) ([name])    status post bilateral femoropopliteal bypass grafting --> Dr. Sherren Mocha Early; -  02/2013 Revision of R Fem-AKPop to BK Pop.  . Patent foramen ovale February 2013   Small. Small PFO noted on TTE for TIA/CVA R-L + Bubble study with valsalva  . Pneumonia   . Stroke [facility])    left side weakness remains (11/26/2012)  . Transient ischemic attack (TIA) February 2013  . Type II diabetes mellitus ([place])     Past Surgical History:  Procedure Laterality Date  . ANKLE FRACTURE SURGERY Right 2007   "got  a metal plate and 8 screws in it" (11/26/2012)  . APPENDECTOMY  1962  . BYPASS GRAFT POPLITEAL TO POPLITEAL Right 02/05/2013   Procedure: BYPASS GRAFT ABOVE KNEE POPLITEAL TO BELOW KNEE POPLITEAL WITH SMALL SAPHANOUS VEIN;  Surgeon: Rosetta Posner, MD;  Location: [place];  Service: Vascular;  Laterality: Right;  . CARDIAC CATHETERIZATION  1999; 10/2005   5/'07: mildLAD 20-30%, patent stent, diags- no Dz. RCA 2 BMS patent ~20% ISR.   Marland Kitchen CARDIAC CATHETERIZATION N/A 10/06/2015   Procedure: Left Heart Cath and Coronary Angiography;  Surgeon: Leonie Man, MD;  Location: [place] CV LAB;  Service: Cardiovascular; p-mLAD 99% pre-stent, 60% ISR & 80% post-stent, o-RPDA 90%, ~10% ISR in 2 RCA (Prox & mid) BMS,~20% ISR in pCx BMS with 5% OM2 BMS.    Marland Kitchen CARDIAC CATHETERIZATION N/A 10/06/2015   Procedure: Coronary  Stent Intervention;  Surgeon: Leonie Man, MD;  Location: [place] CV LAB;  Service: Cardiovascular; p-m LAD Promus DES 2.75 x 38 (tapered post-dilatoin)  . CARDIAC CATHETERIZATION N/A 10/06/2015   Procedure: Coronary Balloon Angioplasty;  Surgeon: Leonie Man, MD;  Location: [place] CV LAB;  Service: Cardiovascular;  POBA of Ostial rPDA.  Marland Kitchen CARPAL TUNNEL RELEASE Right 1980's?  Marland Kitchen CATARACT EXTRACTION W/ INTRAOCULAR LENS IMPLANT Right 2011  . [place]  . CORONARY ANGIOPLASTY WITH STENT PLACEMENT  1999--2005   1999 - OM2 BMS; 2001: p-mCx NIR BMS 3.5 x 12; 10/2001: p-mRCA Express II BMS - 3.5 x 16 - 3.0 x 24.   . ESOPHAGOGASTRODUODENOSCOPY  12/20/2011   Procedure: ESOPHAGOGASTRODUODENOSCOPY (EGD);  Surgeon: Beryle Beams, MD;  Location: [facility];  Service: Endoscopy;  Laterality: N/A;  EGD with balloon dilation  . EYE SURGERY    . FEMORAL BYPASS  1999; 2000   Bilateral Fem-AK POP bypass.   Marland Kitchen PERIPHERAL VASCULAR CATHETERIZATION N/A 12/01/2015   Procedure: Abdominal Aortogram w/Lower Extremity;  Surgeon: Elam Dutch, MD;  Location: [place] CV LAB;  Service: Cardiovascular;  Laterality: N/A;  . PERIPHERAL VASCULAR CATHETERIZATION Right 12/22/2015   Procedure: Peripheral Vascular Intervention;  Surgeon: Rosetta Posner, MD;  Location: [place] CV [facility];  Service: Cardiovascular;  Laterality: Right;  EXternal iliac  . SAVORY DILATION  12/20/2011   Procedure: SAVORY DILATION;  Surgeon: Beryle Beams, MD;  Location: [facility];  Service: Endoscopy;  Laterality: N/A;  . TEE WITHOUT CARDIOVERSION  07/26/2011   Procedure: TRANSESOPHAGEAL ECHOCARDIOGRAM (TEE);  Surgeon: Pixie Casino, MD;  Location: [facility] ENDOSCOPY;  Service: Cardiovascular;  Laterality: N/A;  . TONSILLECTOMY  1969  . TRANSTHORACIC ECHOCARDIOGRAM  March 2016   Normal LV size and function EF 55-60%. No regional wall motion and amount is. No significant valvular lesions.  . TUBAL LIGATION  1988  . VAGINAL  HYSTERECTOMY  1988    Family History  Problem Relation Age of Onset  . Cancer Mother     pancreatic  . Diabetes Mother   . Hyperlipidemia Mother   . Hypertension Mother   . Other Mother     varicose vein  . Cancer Father 72    throat  . Heart disease Father   . Heart attack Father   . Cancer Sister     breast  . Diabetes Sister   . Deep vein thrombosis Brother   . Diabetes Brother   . Hearing loss Brother   . Hyperlipidemia Brother   . Hypertension Brother   . Heart attack Brother   .  Clotting disorder Brother   . Diabetes Son   . Hyperlipidemia Son    Social History:  reports that she quit smoking about 5 weeks ago. Her smoking use included Cigarettes. She has a 50.00 pack-year smoking history. She has never used smokeless tobacco. She reports that she drinks alcohol. She reports that she does not use drugs.  Allergies:  Allergies  Allergen Reactions  . Tape Hives and Other (See Comments)    USE PAPER TAPE ONLY- Adhesive peels off skin-makes pt. Raw.  . Isosorbide Other (See Comments)    Drops BP too low  . Nicoderm [Nicotine] Rash    To the PATCH only, breakouts on skin    OUTPATIENT MEDICATIONS: No current facility-administered medications on file prior to encounter.    Current Outpatient Prescriptions on File Prior to Encounter  Medication Sig Dispense Refill  . acetaminophen (TYLENOL) 325 MG tablet Take 2 tablets (650 mg total) by mouth every 6 (six) hours as needed. 60 tablet 0  . albuterol (PROVENTIL HFA;VENTOLIN HFA) 108 (90 BASE) MCG/ACT inhaler Inhale 2 puffs into the lungs every 6 (six) hours as needed for wheezing or shortness of breath. 1 Inhaler 0  . aspirin EC 81 MG tablet Take 1 tablet (81 mg total) by mouth daily. 30 tablet 10  . buPROPion (WELLBUTRIN XL) 300 MG 24 hr tablet Take 300 mg by mouth daily.     . clopidogrel (PLAVIX) 75 MG tablet Take 1 tablet (75 mg total) by mouth daily. 30 tablet 12  . docusate sodium (COLACE) 100 MG capsule Take 100  mg by mouth daily as needed for mild constipation.     . fish oil-omega-3 fatty acids 1000 MG capsule Take 1 g by mouth 2 (two) times daily.     . fluticasone (FLONASE) 50 MCG/ACT nasal spray Place 2 sprays into both nostrils daily. 16 g 2  . fluticasone furoate-vilanterol (BREO ELLIPTA) 100-25 MCG/INH AEPB Inhale 1 puff into the lungs daily. 60 each 5  . guaiFENesin (MUCINEX) 600 MG 12 hr tablet Take 1,200 mg by mouth 2 (two) times daily.    . insulin glargine (LANTUS) 100 UNIT/ML injection Inject 5 Units into the skin at bedtime.    . insulin lispro (HUMALOG) 100 UNIT/ML injection Inject 3 Units into the skin 3 (three) times daily with meals as needed for high blood sugar.     . ipratropium-albuterol (DUONEB) 0.5-2.5 (3) MG/3ML SOLN Take 3 mLs by nebulization every 6 (six) hours. (Patient taking differently: Take 3 mLs by nebulization 2 (two) times daily as needed (for wheezingt or shortness of breath). ) 360 mL 3  . loratadine (CLARITIN) 10 MG tablet Take 1 tablet (10 mg total) by mouth daily. 30 tablet 0  . Multiple Vitamins-Minerals (MULTIVITAMIN & MINERAL PO) Take 1 tablet by mouth daily.    Marland Kitchen neomycin-bacitracin-polymyxin (NEOSPORIN) ointment Apply 1 application topically as needed. For open wound     . nitroGLYCERIN (NITROLINGUAL) 0.4 MG/SPRAY spray Place 1 spray under the tongue every 5 (five) minutes as needed for chest pain. 12 g 0  . omeprazole (PRILOSEC) 20 MG capsule Take 20 mg by mouth daily.     . polyethylene glycol powder (GLYCOLAX/MIRALAX) powder Take 17 g by mouth 2 (two) times daily. 255 g 0  . rosuvastatin (CRESTOR) 20 MG tablet Take 20 mg by mouth daily.    Marland Kitchen SPIRIVA RESPIMAT 2.5 MCG/ACT AERS 2 puffs daily.    . traMADol (ULTRAM) 50 MG tablet Take 1 tablet (50 mg total)  by mouth every 6 (six) hours as needed for moderate pain or severe pain. 30 tablet 0  . cyclobenzaprine (FLEXERIL) 5 MG tablet Take 1-2 tablets (5-10 mg total) by mouth 3 (three) times daily as needed for  muscle spasms. 30 tablet 3  . feeding supplement, GLUCERNA SHAKE, (GLUCERNA SHAKE) LIQD Take 237 mLs by mouth 2 (two) times daily between meals. (Patient not taking: Reported on 05/10/2016)  0   CURRENT MEDICATIONS: Scheduled Meds: . aspirin EC  81 mg Oral Daily  . buPROPion  300 mg Oral Daily  . clopidogrel  75 mg Oral Daily  . feeding supplement (GLUCERNA SHAKE)  237 mL Oral BID BM  . fluticasone furoate-vilanterol  1 puff Inhalation Daily  . guaiFENesin  1,200 mg Oral BID  . insulin aspart  0-5 Units Subcutaneous QHS  . insulin aspart  0-9 Units Subcutaneous TID WC  . insulin glargine  5 Units Subcutaneous QHS  . ipratropium-albuterol  3 mL Nebulization Q6H  . mouth rinse  15 mL Mouth Rinse BID  . pantoprazole  40 mg Oral Daily  . piperacillin-tazobactam (ZOSYN)  IV  3.375 g Intravenous Q8H  . polyethylene glycol  17 g Oral BID  . predniSONE  40 mg Oral Q breakfast  . rosuvastatin  20 mg Oral Daily  . sodium chloride flush  3 mL Intravenous Q12H   Continuous Infusions: . sodium chloride 75 mL/hr at 05/10/16 1235  . heparin 700 Units/hr (05/10/16 1014)   PRN Meds:.acetaminophen **OR** acetaminophen, albuterol, cyclobenzaprine, ondansetron **OR** ondansetron (ZOFRAN) IV, traMADol   Results for orders placed or performed during the hospital encounter of 05/09/16 (from the past 48 hour(s))  Basic metabolic panel     Status: Abnormal   Collection Time: 05/09/16  8:18 PM  Result Value Ref Range   Sodium 126 (L) 135 - 145 mmol/L   Potassium 4.1 3.5 - 5.1 mmol/L   Chloride 91 (L) 101 - 111 mmol/L   CO2 24 22 - 32 mmol/L   Glucose, Bld 274 (H) 65 - 99 mg/dL   BUN 17 6 - 20 mg/dL   Creatinine, Ser 1.10 (H) 0.44 - 1.00 mg/dL   Calcium 8.9 8.9 - 10.3 mg/dL   GFR calc non Af Amer 52 (L) >60 mL/min   GFR calc Af Amer 60 (L) >60 mL/min    Comment: (NOTE) The eGFR has been calculated using the CKD EPI equation. This calculation has not been validated in all clinical  situations. eGFR's persistently <60 mL/min signify possible Chronic Kidney Disease.    Anion gap 11 5 - 15  CBC     Status: Abnormal   Collection Time: 05/09/16  8:18 PM  Result Value Ref Range   WBC 20.4 (H) 4.0 - 10.5 K/uL   RBC 3.95 3.87 - 5.11 MIL/uL   Hemoglobin 12.0 12.0 - 15.0 g/dL   HCT 35.9 (L) 36.0 - 46.0 %   MCV 90.9 78.0 - 100.0 fL   MCH 30.4 26.0 - 34.0 pg   MCHC 33.4 30.0 - 36.0 g/dL   RDW 15.7 (H) 11.5 - 15.5 %   Platelets 270 150 - 400 K/uL  I-stat troponin, ED     Status: Abnormal   Collection Time: 05/09/16  8:32 PM  Result Value Ref Range   Troponin i, poc 0.86 (HH) 0.00 - 0.08 ng/mL   Comment NOTIFIED PHYSICIAN    Comment 3            Comment: Due to the release kinetics  of cTnI, a negative result within the first hours of the onset of symptoms does not rule out myocardial infarction with certainty. If myocardial infarction is still suspected, repeat the test at appropriate intervals.   Brain natriuretic peptide     Status: Abnormal   Collection Time: 05/09/16  9:02 PM  Result Value Ref Range   B Natriuretic Peptide 175.5 (H) 0.0 - 100.0 pg/mL  I-Stat CG4 Lactic Acid, ED     Status: None   Collection Time: 05/10/16 12:33 AM  Result Value Ref Range   Lactic Acid, Venous 0.85 0.5 - 1.9 mmol/L  MRSA PCR Screening     Status: None   Collection Time: 05/10/16  2:55 AM  Result Value Ref Range   MRSA by PCR NEGATIVE NEGATIVE    Comment:        The GeneXpert MRSA Assay (FDA approved for NASAL specimens only), is one component of a comprehensive MRSA colonization surveillance program. It is not intended to diagnose MRSA infection nor to guide or monitor treatment for MRSA infections.   Strep pneumoniae urinary antigen     Status: None   Collection Time: 05/10/16  2:58 AM  Result Value Ref Range   Strep Pneumo Urinary Antigen NEGATIVE NEGATIVE    Comment:        Infection due to S. pneumoniae cannot be absolutely ruled out since the antigen  present may be below the detection limit of the test.   Sodium, urine, random     Status: None   Collection Time: 05/10/16  2:58 AM  Result Value Ref Range   Sodium, Ur 49 mmol/L  Osmolality, urine     Status: None   Collection Time: 05/10/16  2:58 AM  Result Value Ref Range   Osmolality, Ur 652 300 - 900 mOsm/kg  Glucose, capillary     Status: Abnormal   Collection Time: 05/10/16  3:09 AM  Result Value Ref Range   Glucose-Capillary 214 (H) 65 - 99 mg/dL  Procalcitonin - Baseline     Status: None   Collection Time: 05/10/16  3:23 AM  Result Value Ref Range   Procalcitonin 0.55 ng/mL    Comment:        Interpretation: PCT > 0.5 ng/mL and <= 2 ng/mL: Systemic infection (sepsis) is possible, but other conditions are known to elevate PCT as well. (NOTE)         ICU PCT Algorithm               Non ICU PCT Algorithm    ----------------------------     ------------------------------         PCT < 0.25 ng/mL                 PCT < 0.1 ng/mL     Stopping of antibiotics            Stopping of antibiotics       strongly encouraged.               strongly encouraged.    ----------------------------     ------------------------------       PCT level decrease by               PCT < 0.25 ng/mL       >= 80% from peak PCT       OR PCT 0.25 - 0.5 ng/mL          Stopping of antibiotics  encouraged.     Stopping of antibiotics           encouraged.    ----------------------------     ------------------------------       PCT level decrease by              PCT >= 0.25 ng/mL       < 80% from peak PCT        AND PCT >= 0.5 ng/mL             Continuing antibiotics                                              encouraged.       Continuing antibiotics            encouraged.    ----------------------------     ------------------------------     PCT level increase compared          PCT > 0.5 ng/mL         with peak PCT AND          PCT >= 0.5 ng/mL              Escalation of antibiotics                                          strongly encouraged.      Escalation of antibiotics        strongly encouraged.   Comprehensive metabolic panel     Status: Abnormal   Collection Time: 05/10/16  3:23 AM  Result Value Ref Range   Sodium 131 (L) 135 - 145 mmol/L   Potassium 4.1 3.5 - 5.1 mmol/L   Chloride 99 (L) 101 - 111 mmol/L   CO2 24 22 - 32 mmol/L   Glucose, Bld 258 (H) 65 - 99 mg/dL   BUN 16 6 - 20 mg/dL   Creatinine, Ser 3.39 0.44 - 1.00 mg/dL   Calcium 7.7 (L) 8.9 - 10.3 mg/dL   Total Protein 5.0 (L) 6.5 - 8.1 g/dL   Albumin 2.3 (L) 3.5 - 5.0 g/dL   AST 20 15 - 41 U/L   ALT 16 14 - 54 U/L   Alkaline Phosphatase 175 (H) 38 - 126 U/L   Total Bilirubin 1.7 (H) 0.3 - 1.2 mg/dL   GFR calc non Af Amer >60 >60 mL/min   GFR calc Af Amer >60 >60 mL/min    Comment: (NOTE) The eGFR has been calculated using the CKD EPI equation. This calculation has not been validated in all clinical situations. eGFR's persistently <60 mL/min signify possible Chronic Kidney Disease.    Anion gap 8 5 - 15  CBC     Status: Abnormal   Collection Time: 05/10/16  3:23 AM  Result Value Ref Range   WBC 14.1 (H) 4.0 - 10.5 K/uL   RBC 3.53 (L) 3.87 - 5.11 MIL/uL   Hemoglobin 10.5 (L) 12.0 - 15.0 g/dL   HCT 38.2 (L) 25.1 - 89.7 %   MCV 90.4 78.0 - 100.0 fL   MCH 29.7 26.0 - 34.0 pg   MCHC 32.9 30.0 - 36.0 g/dL   RDW 39.5 85.4 - 80.8 %   Platelets 210 150 - 400 K/uL  Troponin I  Status: Abnormal   Collection Time: 05/10/16  3:23 AM  Result Value Ref Range   Troponin I 0.78 (HH) <0.03 ng/mL    Comment: CRITICAL RESULT CALLED TO, READ BACK BY AND VERIFIED WITH: Serita Kyle 05/10/16 0437 WAYK   Osmolality     Status: None   Collection Time: 05/10/16  4:43 AM  Result Value Ref Range   Osmolality 287 275 - 295 mOsm/kg  Troponin I     Status: Abnormal   Collection Time: 05/10/16  8:26 AM  Result Value Ref Range   Troponin I 0.69 (HH) <0.03 ng/mL     Comment: CRITICAL VALUE NOTED.  VALUE IS CONSISTENT WITH PREVIOUSLY REPORTED AND CALLED VALUE.  Glucose, capillary     Status: Abnormal   Collection Time: 05/10/16  8:29 AM  Result Value Ref Range   Glucose-Capillary 258 (H) 65 - 99 mg/dL  Glucose, capillary     Status: Abnormal   Collection Time: 05/10/16 12:31 PM  Result Value Ref Range   Glucose-Capillary 214 (H) 65 - 99 mg/dL   Dg Chest 2 View  Result Date: 05/09/2016 CLINICAL DATA:  Dyspnea and chest pain today. EXAM: CHEST  2 VIEW COMPARISON:  04/15/2016 FINDINGS: Diffuse interstitial prominence, worsened. This is particularly evident in the basilar periphery. There is patchy airspace opacity in the right upper lobe inferiorly. No pleural effusions. Hilar and mediastinal contours are unremarkable and unchanged. Baseline emphysematous disease appears severe, upper lobe predominant. IMPRESSION: The findings most likely represent congestive heart failure with interstitial edema and a mild degree of asymmetric alveolar edema in the right upper lobe, superimposed on severe emphysematous disease. Cannot exclude infectious infiltrate. Electronically Signed   By: Ellery Plunk M.D.   On: 05/09/2016 21:42   Ct Angio Chest Pe W And/or Wo Contrast  Result Date: 05/10/2016 CLINICAL DATA:  Chest pain and shortness of breath tonight. History of COPD, diabetes, head and neck cancer. EXAM: CT ANGIOGRAPHY CHEST WITH CONTRAST TECHNIQUE: Multidetector CT imaging of the chest was performed using the standard protocol during bolus administration of intravenous contrast. Multiplanar CT image reconstructions and MIPs were obtained to evaluate the vascular anatomy. CONTRAST:  80 cc Isovue 370 COMPARISON:  Chest radiograph May 09, 2016 at 2109 hours and CT chest November 17, 2015 and CT chest May 29, 2015 FINDINGS: CARDIOVASCULAR: Adequate contrast opacification of the pulmonary artery's. Main pulmonary artery is not enlarged. No pulmonary arterial filling  defects to the level of the subsegmental branches. Heart size is normal, RIGHT heart strain (RV/ LV 1.3) attributable to patient's emphysema and pneumonia. Severe coronary artery calcifications. No pericardial effusions. Thoracic aorta is normal course and caliber, moderate calcific atherosclerosis of the aortic arch. Probable stenosis LEFT subclavian artery origin, not tailored for evaluation. MEDIASTINUM/NODES: No lymphadenopathy by CT size criteria. Borderline RIGHT hilar lymphadenopathy is likely reactive. LUNGS/PLEURA: Debris and RIGHT lower lobe bronchus with distal bronchi patchy partially occlusive debris. No pneumothorax. Moderate to severe centrilobular emphysema. Patchy consolidation in the periphery of the RIGHT middle lobe, RIGHT lower lobe. Stable 5 mm RIGHT upper lobe pulmonary nodule. 18 mm spiculated nodular density in LEFT lower lobe, lateral to site of prior 7 mm nodule which are stable. Diffuse interlobular septal thickening. UPPER ABDOMEN: Included view of the abdomen is unremarkable. MUSCULOSKELETAL: Visualized soft tissues and included osseous structures are nonacute. Old anterior rib fractures. Single lumen LEFT humeral head and manubrium. Osteopenia. Review of the MIP images confirms the above findings. IMPRESSION: No acute pulmonary embolism. Multifocal RIGHT greater than  LEFT pneumonia, with debris in RIGHT lower lobe bronchus most consistent with aspiration pneumonia. Please note, LEFT lower lobe consolidations is spiculated and, neoplasm not excluded. Recommend 3 month follow-up CT chest to verify improvement. Stable 5 mm RIGHT upper lobe, 7 mm low LEFT lower lobe pulmonary nodules in the setting of moderate to severe emphysema. Electronically Signed   By: Elon Alas M.D.   On: 05/10/2016 00:06    ROS: General:no colds or fevers, no weight changes Skin:no rashes or ulcers HEENT:no blurred vision, no congestion CV:see HPI PUL:see HPI GI:no diarrhea constipation or melena,  no indigestion GU:no hematuria, no dysuria MS:no joint pain, no claudication Neuro:no syncope, no lightheadedness Endo:+ diabetes, no thyroid disease   Blood pressure 116/68, pulse 96, temperature 98.3 F (36.8 C), temperature source Oral, resp. rate 13, height '5\' 5"'$  (1.651 m), weight 129 lb 8 oz (58.7 kg), SpO2 96 %.  Wt Readings from Last 3 Encounters:  05/10/16 129 lb 8 oz (58.7 kg)  05/06/16 127 lb 3.2 oz (57.7 kg)  05/01/16 124 lb (56.2 kg)    PE: General:Pleasant affect, NAD Skin:Warm and dry, brisk capillary refill HEENT:normocephalic, sclera clear, mucus membranes moist Neck:supple, no JVD, no bruits  Heart:S1S2 RRR without murmur, gallup, rub or click Lungs:diminished  without rales, rhonchi, or wheezes DQQ:IWLN, mild tenderness Rt mid lobe, + BS to hypoactive, do not palpate liver spleen or masses Ext:no lower ext edema, ? pedal pulses but both feet equally warm, 2+ radial pulses Neuro:alert and oriented X 3, MAE, follows commands, + facial symmetry    Assessment/Plan Principal Problem:   Sepsis, unspecified organism ([place]) Active Problems:   DM (diabetes mellitus), type 2, uncontrolled ([name])   CAD: Stents in p-mRCA, p & m Cx, p-mLAD   CAP (community acquired pneumonia)   COPD exacerbation ([place])   Dysphagia with aspiration   Acute on chronic respiratory failure with hypoxia ([name])   Protein-calorie malnutrition, moderate ([name])   Hypotension   Sepsis ([place])   Lobar pneumonia ([place])  PNA/sepsis followed by primary team  Chest pain with elevated troponin - EKG stable- on IV heparin, Dr. Sallyanne Kuster to see. Treat medically for now may have elevated troponin due to demand ischemia.    CAD with most recent stent and POBA 10/2015 continue plavix.  Continue statin No BB due to COPD and here BP has been down to 74/54   DM per IM     Cecilie Kicks  Nurse Practitioner Certified [place] Pager [phone] or after 5pm or weekends call  [phone] 05/10/2016, 12:46 PM  I have seen and examined the patient along with Cecilie Kicks  NP.  I have reviewed the chart, notes and new data.  I agree with NP's note.  Key new complaints: she has had longstanding problems with swallowing, ever since XRT/chemo for tongue cancer. She was supposed to be on a thickened diet, but has "been slack recently". The chest pain she is still having now is clearly pleuritis, but earlier was reminiscent of her angina. Key examination changes: reduced breath sounds and egophony in R base. Key new findings / data: minor troponin elevation, subtle ST depression V5-V6. "Debris" in right lung airways c/w aspiration. Also has spiculated L lung mass.  PLAN: Suspect the troponin abnormality is a reflection of demand ischemia in the setting of aspiration pneumonia, but she is also in the right time-frame for post PCI restenosis (especially since one vessel had only angioplasty, without stent). Recommend IV heparin for 48-72 hours  only, then change to DVT prophylaxis dose.. Continue ASA and clopidogrel. If symptoms resolve over the weekend, recommend outpatient Lexiscan Myoview. If angina recurs or there are further ECG changes, plan cardiac cath on Monday. Needs repeat evaluation (or at least instruction) from Speech Pathology. May need further evaluation for new L lung spiculated mass.   Sanda Klein, MD, [place] [phone] 05/10/2016, 2:10 PM

## 2016-05-10 NOTE — Progress Notes (Signed)
ANTICOAGULATION CONSULT NOTE - Initial Consult   Pharmacy Consult for Heparin Indication: chest pain/ACS  Allergies  Allergen Reactions  . Tape Hives and Other (See Comments)    USE PAPER TAPE ONLY- Adhesive peels off skin-makes pt. Raw.  . Isosorbide Other (See Comments)    Drops BP too low  . Nicoderm [Nicotine] Rash    To the PATCH only, breakouts on skin    Patient Measurements: Height: '5\' 5"'$  (165.1 cm) Weight: 129 lb 8 oz (58.7 kg) IBW/kg (Calculated) : 57  Vital Signs: Temp: 97.9 F (36.6 C) (12/08 1638) Temp Source: Oral (12/08 1638) BP: 106/64 (12/08 1638) Pulse Rate: 99 (12/08 1638)  Labs:  Recent Labs  05/09/16 2018 05/10/16 0323 05/10/16 0826 05/10/16 1446 05/10/16 1629  HGB 12.0 10.5*  --   --   --   HCT 35.9* 31.9*  --   --   --   PLT 270 210  --   --   --   HEPARINUNFRC  --   --   --   --  0.36  CREATININE 1.10* 0.87  --   --   --   TROPONINI  --  0.78* 0.69* 0.73*  --     Estimated Creatinine Clearance: 58 mL/min (by C-G formula based on SCr of 0.87 mg/dL).   Assessment: Brenda [name] is a 68 yoF who presented with chest pain and shortness of breath x1 day that was unrelieved by nitroglycerin and aspirin prior to admission. CT of chest showed no evidence of PE, however it was concerning for multifocal aspiration pneumonia for which antibiotics have been started. Troponins were elevated on admission at 0.86, now trending down slightly. Chest pain continues, so pharmacy is consulted to start heparin.  First heparin level = 0.36, therapeutic on 700 units/hr.   Goal of Therapy:  Heparin level 0.3-0.7 units/ml Monitor platelets by anticoagulation protocol: Yes   Plan:  Continue IV Heparin at current rate.  Daily Heparin Level/CBC Monitor signs/symptoms of bleeding  Maryanna Shape, PharmD, BCPS  Clinical Pharmacist  Pager: [phone]   05/10/2016 5:18 PM

## 2016-05-10 NOTE — H&P (Signed)
History and Physical  Patient Name: Brenda [name]     XBM:[id number]    DOB: 08/04/1950    DOA: 05/09/2016 PCP: [name], MD   Patient coming from: Home  Chief Complaint: Chest pain  HPI: Brenda [name] is a 65 y.o. female with a past medical history significant for COPD on nightly O2, CAD s/p PCI, Tongue cancer s/p rad tx c/b dysphagia and recurrent aspiration pneumonia, and PVD who presents with chest pain.  The patient was in her usual health until today.  She was tired so she stayed in bed all day, then in the afternoon was sitting at the kitchen table when she had onset of central dull stabbing chest pain, radiating to the back.  This gradually worsened, and didn't improve with nitro or aspirin, so the family brought her to the ER.  There has been no recent fever, change in her chronic cough.  She has had a change in her sputum recently, which they attributed to her quitting smoking.  There was no diaphoresis, nausea, pallor with the pain.  There was no recent choking episode or known aspiration.  She has recently been on opioid pain medicines for compression fracture in her back, family have noticed more desats at night with this.  ED course: -Afebrile, heart rate 120, respirations 24, pulse oximetry 83%, blood pressure 93/59 -Na 126, K 4.1, Cr 1.1 (baseline 0.7-0.9), WBC 20.4K, Hgb 12 -Lactate 0.85 -Troponin 0.86, BNP 175 -CXR showed patchy bilateral opacities, worse in RLL -CT PE protocol showed no PE but multifocal pneumonia, in an aspiration pattern, and repeat CT needed to evaluate suspicious LLL nodule in 6 months -Cultures were obtained, 30 cc/kg fluid bolus and vanc/Zosyn was administered and TRH were asked to evaluate for sepsis from pneumonia         ROS: Review of Systems  Constitutional: Positive for malaise/fatigue. Negative for chills and fever.  Respiratory: Positive for cough (chronic), sputum production (changed), shortness of breath (chronic) and wheezing  (chronic).   Cardiovascular: Positive for chest pain. Negative for orthopnea and leg swelling.  All other systems reviewed and are negative.         Past Medical History:  Diagnosis Date  . Arthritis    "hands" (11/26/2012)  . CAD S/P percutaneous coronary angioplasty 1999; 2001;2003, 10/2014   a) BMS- mCx; b) re-do PCI for prog of Dz (NIR BMS 3.5 x 12); c) staged PCI: p-mRCA Express II BMS 3.0x24 & 3.5 x 16 --> p-mLAD Cyper DES 3.0 x 13 & PTCA of D2 (2.5 balloon); 10/2015 PCI p-m LAD Promus DES 2.75 x 38 (covers pre&post-stent ISR), POBA of ostial rPDA  . Cancer of base of tongue ([place]) 2010   "& lymph nodes @ right neck" (11/26/2012  . Carotid artery occlusion   . Chronic bronchitis ([place])   . COPD (chronic obstructive pulmonary disease) with emphysema ([place])   . Depression   . GERD (gastroesophageal reflux disease)   . H/O hiatal hernia   . History of blood transfusion   . History of DVT (deep vein thrombosis)   . Hx of radiation therapy   . Hx of radiation therapy 01/16/09 - 03/06/09   base of tongue, right neck node  . Hypercholesteremia   . Myocardial infarction 1999, 2003  . PAD (peripheral artery disease) ([name])    status post bilateral femoropopliteal bypass grafting --> Dr. Sherren Mocha Early; -  02/2013 Revision of R Fem-AKPop to BK Pop.  . Patent foramen ovale February 2013  Small. Small PFO noted on TTE for TIA/CVA R-L + Bubble study with valsalva  . Pneumonia   . Stroke [facility])    left side weakness remains (11/26/2012)  . Transient ischemic attack (TIA) February 2013  . Type II diabetes mellitus ([place])     Past Surgical History:  Procedure Laterality Date  . ANKLE FRACTURE SURGERY Right 2007   "got a metal plate and 8 screws in it" (11/26/2012)  . APPENDECTOMY  1962  . BYPASS GRAFT POPLITEAL TO POPLITEAL Right 02/05/2013   Procedure: BYPASS GRAFT ABOVE KNEE POPLITEAL TO BELOW KNEE POPLITEAL WITH SMALL SAPHANOUS VEIN;  Surgeon: Rosetta Posner, MD;  Location: [place];  Service:  Vascular;  Laterality: Right;  . CARDIAC CATHETERIZATION  1999; 10/2005   5/'07: mildLAD 20-30%, patent stent, diags- no Dz. RCA 2 BMS patent ~20% ISR.   Marland Kitchen CARDIAC CATHETERIZATION N/A 10/06/2015   Procedure: Left Heart Cath and Coronary Angiography;  Surgeon: Leonie Man, MD;  Location: [place] [facility];  Service: Cardiovascular; p-mLAD 99% pre-stent, 60% ISR & 80% post-stent, o-RPDA 90%, ~10% ISR in 2 RCA (Prox & mid) BMS,~20% ISR in pCx BMS with 5% OM2 BMS.    Marland Kitchen CARDIAC CATHETERIZATION N/A 10/06/2015   Procedure: Coronary Stent Intervention;  Surgeon: Leonie Man, MD;  Location: [place] [facility];  Service: Cardiovascular; p-m LAD Promus DES 2.75 x 38 (tapered post-dilatoin)  . CARDIAC CATHETERIZATION N/A 10/06/2015   Procedure: Coronary Balloon Angioplasty;  Surgeon: Leonie Man, MD;  Location: [place];  Service: Cardiovascular;  POBA of Ostial rPDA.  Marland Kitchen CARPAL TUNNEL RELEASE Right 1980's?  Marland Kitchen CATARACT EXTRACTION W/ INTRAOCULAR LENS IMPLANT Right 2011  . [place]  . CORONARY ANGIOPLASTY WITH STENT PLACEMENT  1999--2005   1999 - OM2 BMS; 2001: p-mCx NIR BMS 3.5 x 12; 10/2001: p-mRCA Express II BMS - 3.5 x 16 - 3.0 x 24.   . ESOPHAGOGASTRODUODENOSCOPY  12/20/2011   Procedure: ESOPHAGOGASTRODUODENOSCOPY (EGD);  Surgeon: Beryle Beams, MD;  Location: [facility];  Service: Endoscopy;  Laterality: N/A;  EGD with balloon dilation  . EYE SURGERY    . FEMORAL BYPASS  1999; 2000   Bilateral Fem-AK POP bypass.   Marland Kitchen PERIPHERAL VASCULAR CATHETERIZATION N/A 12/01/2015   Procedure: Abdominal Aortogram w/Lower Extremity;  Surgeon: Elam Dutch, MD;  Location: [place] [facility];  Service: Cardiovascular;  Laterality: N/A;  . PERIPHERAL VASCULAR CATHETERIZATION Right 12/22/2015   Procedure: Peripheral Vascular Intervention;  Surgeon: Rosetta Posner, MD;  Location: [place] [facility];  Service: Cardiovascular;  Laterality: Right;  EXternal iliac  . SAVORY DILATION  12/20/2011    Procedure: SAVORY DILATION;  Surgeon: Beryle Beams, MD;  Location: [facility];  Service: Endoscopy;  Laterality: N/A;  . TEE WITHOUT CARDIOVERSION  07/26/2011   Procedure: TRANSESOPHAGEAL ECHOCARDIOGRAM (TEE);  Surgeon: Pixie Casino, MD;  Location: [facility];  Service: Cardiovascular;  Laterality: N/A;  . TONSILLECTOMY  1969  . TRANSTHORACIC ECHOCARDIOGRAM  March 2016   Normal LV size and function EF 55-60%. No regional wall motion and amount is. No significant valvular lesions.  . TUBAL LIGATION  1988  . VAGINAL HYSTERECTOMY  1988    Social History: Patient lives with her daughter.  The patient walks unassisted.  She quit smoking in [place].    Allergies  Allergen Reactions  . Tape Hives and Other (See Comments)    USE PAPER TAPE ONLY- Adhesive peels off skin-makes pt. Raw.  Marland Kitchen  Isosorbide Other (See Comments)    Drops BP too low  . Nicoderm [Nicotine] Rash    To the PATCH only, breakouts on skin    Family history: family history includes Cancer in her mother and sister; Cancer (age of onset: 49) in her father; Clotting disorder in her brother; Deep vein thrombosis in her brother; Diabetes in her brother, mother, sister, and son; Hearing loss in her brother; Heart attack in her brother and father; Heart disease in her father; Hyperlipidemia in her brother, mother, and son; Hypertension in her brother and mother; Other in her mother.  Prior to Admission medications   Medication Sig Start Date End Date Taking? Authorizing Provider  acetaminophen (TYLENOL) 325 MG tablet Take 2 tablets (650 mg total) by mouth every 6 (six) hours as needed. 05/01/16  Yes [name], PA-C  albuterol (PROVENTIL HFA;VENTOLIN HFA) 108 (90 BASE) MCG/ACT inhaler Inhale 2 puffs into the lungs every 6 (six) hours as needed for wheezing or shortness of breath. 01/20/15  Yes Belkys A Regalado, MD  aspirin EC 81 MG tablet Take 1 tablet (81 mg total) by mouth daily. 10/07/15  Yes Thurnell Lose, MD    buPROPion (WELLBUTRIN XL) 300 MG 24 hr tablet Take 300 mg by mouth daily.  09/19/15  Yes Historical Provider, MD  clopidogrel (PLAVIX) 75 MG tablet Take 1 tablet (75 mg total) by mouth daily. 10/07/15  Yes Thurnell Lose, MD  docusate sodium (COLACE) 100 MG capsule Take 100 mg by mouth daily as needed for mild constipation.    Yes Historical Provider, MD  fish oil-omega-3 fatty acids 1000 MG capsule Take 1 g by mouth 2 (two) times daily.    Yes Historical Provider, MD  fluticasone (FLONASE) 50 MCG/ACT nasal spray Place 2 sprays into both nostrils daily. 06/02/15  Yes Debbe Odea, MD  fluticasone furoate-vilanterol (BREO ELLIPTA) 100-25 MCG/INH AEPB Inhale 1 puff into the lungs daily. 09/13/15  Yes Tammy S Parrett, NP  guaiFENesin (MUCINEX) 600 MG 12 hr tablet Take 1,200 mg by mouth 2 (two) times daily.   Yes Historical Provider, MD  insulin glargine (LANTUS) 100 UNIT/ML injection Inject 5 Units into the skin at bedtime.   Yes Historical Provider, MD  insulin lispro (HUMALOG) 100 UNIT/ML injection Inject 3 Units into the skin 3 (three) times daily with meals as needed for high blood sugar.    Yes Historical Provider, MD  ipratropium-albuterol (DUONEB) 0.5-2.5 (3) MG/3ML SOLN Take 3 mLs by nebulization every 6 (six) hours. Patient taking differently: Take 3 mLs by nebulization 2 (two) times daily as needed (for wheezingt or shortness of breath).  06/02/15  Yes Debbe Odea, MD  loratadine (CLARITIN) 10 MG tablet Take 1 tablet (10 mg total) by mouth daily. 06/02/15  Yes Debbe Odea, MD  Multiple Vitamins-Minerals (MULTIVITAMIN & MINERAL PO) Take 1 tablet by mouth daily.   Yes Historical Provider, MD  neomycin-bacitracin-polymyxin (NEOSPORIN) ointment Apply 1 application topically as needed. For open wound    Yes Historical Provider, MD  nitroGLYCERIN (NITROLINGUAL) 0.4 MG/SPRAY spray Place 1 spray under the tongue every 5 (five) minutes as needed for chest pain. 01/20/15  Yes Belkys A Regalado, MD   omeprazole (PRILOSEC) 20 MG capsule Take 20 mg by mouth daily.    Yes Historical Provider, MD  polyethylene glycol powder (GLYCOLAX/MIRALAX) powder Take 17 g by mouth 2 (two) times daily. 05/01/16  Yes [name], PA-C  rosuvastatin (CRESTOR) 20 MG tablet Take 20 mg by mouth daily.   Yes  Historical Provider, MD  SPIRIVA RESPIMAT 2.5 MCG/ACT AERS 2 puffs daily. 10/24/15  Yes Historical Provider, MD  traMADol (ULTRAM) 50 MG tablet Take 1 tablet (50 mg total) by mouth every 6 (six) hours as needed for moderate pain or severe pain. 05/09/16  Yes Naiping Ephriam Jenkins, MD  cyclobenzaprine (FLEXERIL) 5 MG tablet Take 1-2 tablets (5-10 mg total) by mouth 3 (three) times daily as needed for muscle spasms. 05/09/16   Leandrew Koyanagi, MD  feeding supplement, GLUCERNA SHAKE, (GLUCERNA SHAKE) LIQD Take 237 mLs by mouth 2 (two) times daily between meals. Patient not taking: Reported on 05/10/2016 04/09/16   Donita Brooks, NP       Physical Exam: BP 94/60   Pulse 108   Temp 98.9 F (37.2 C)   Resp 20   LMP  (LMP Unknown)   SpO2 99%  General appearance: Frail elderly adult female, alert and in mild distress from dyspnea.   Eyes: Anicteric, conjunctiva pink, lids and lashes normal. PERRL.    ENT: No nasal deformity, discharge, epistaxis.  Hearing normal. OP with dry MM.   Neck: No neck masses.  Trachea midline.  No thyromegaly/tenderness. Lymph: No cervical or supraclavicular lymphadenopathy. Skin: Warm and dry.  No jaundice.  No suspicious rashes or lesions. Cardiac: Tachycardic, regular, nl S1-S2, no murmurs appreciated.  Capillary refill is brisk.  JVP not elevated.  No LE edema.  Radial and DP pulses 2+ and symmetric. Respiratory: Increased respiratory rate. Diminished breath sounds bilaterally.  Abdomen: Abdomen soft.  No TTP or guarding. No ascites, distension, hepatosplenomegaly.   MSK: No deformities or effusions.  No cyanosis or clubbing. Neuro: Cranial nerves grossly normal.  Sensation intact to light  touch. Speech is fluent.  Muscle strength globally diminihsed, symmetric.    Psych: Sensorium intact and responding to questions, attention normal, tired.  Behavior appropriate.  Affect blunted.  Judgment and insight appear normal.     Labs on Admission:  I have personally reviewed following labs and imaging studies: CBC:  Recent Labs Lab 05/09/16 2018  WBC 20.4*  HGB 12.0  HCT 35.9*  MCV 90.9  PLT 220   Basic Metabolic Panel:  Recent Labs Lab 05/09/16 2018  NA 126*  K 4.1  CL 91*  CO2 24  GLUCOSE 274*  BUN 17  CREATININE 1.10*  CALCIUM 8.9   GFR: Estimated Creatinine Clearance: 45.9 mL/min (by C-G formula based on SCr of 1.1 mg/dL (H)).  Liver Function Tests: No results for input(s): AST, ALT, ALKPHOS, BILITOT, PROT, ALBUMIN in the last 168 hours. No results for input(s): LIPASE, AMYLASE in the last 168 hours. No results for input(s): AMMONIA in the last 168 hours. Coagulation Profile: No results for input(s): INR, PROTIME in the last 168 hours. Cardiac Enzymes: No results for input(s): CKTOTAL, CKMB, CKMBINDEX, TROPONINI in the last 168 hours. BNP (last 3 results) No results for input(s): PROBNP in the last 8760 hours. HbA1C: No results for input(s): HGBA1C in the last 72 hours. CBG: No results for input(s): GLUCAP in the last 168 hours. Lipid Profile: No results for input(s): CHOL, HDL, LDLCALC, TRIG, CHOLHDL, LDLDIRECT in the last 72 hours. Thyroid Function Tests: No results for input(s): TSH, T4TOTAL, FREET4, T3FREE, THYROIDAB in the last 72 hours. Anemia Panel: No results for input(s): VITAMINB12, FOLATE, FERRITIN, TIBC, IRON, RETICCTPCT in the last 72 hours. Sepsis Labs: Lactic acid 0.85 Invalid input(s): PROCALCITONIN, LACTICIDVEN No results found for this or any previous visit (from the past 240 hour(s)).  Radiological Exams on Admission: Personally reviewed CXR shows Patchy right base opacity, CT PE report reviewed: Dg Chest 2  View  Result Date: 05/09/2016 CLINICAL DATA:  Dyspnea and chest pain today. EXAM: CHEST  2 VIEW COMPARISON:  04/15/2016 FINDINGS: Diffuse interstitial prominence, worsened. This is particularly evident in the basilar periphery. There is patchy airspace opacity in the right upper lobe inferiorly. No pleural effusions. Hilar and mediastinal contours are unremarkable and unchanged. Baseline emphysematous disease appears severe, upper lobe predominant. IMPRESSION: The findings most likely represent congestive heart failure with interstitial edema and a mild degree of asymmetric alveolar edema in the right upper lobe, superimposed on severe emphysematous disease. Cannot exclude infectious infiltrate. Electronically Signed   By: Andreas Newport M.D.   On: 05/09/2016 21:42   Ct Angio Chest Pe W And/or Wo Contrast  Result Date: 05/10/2016 CLINICAL DATA:  Chest pain and shortness of breath tonight. History of COPD, diabetes, head and neck cancer. EXAM: CT ANGIOGRAPHY CHEST WITH CONTRAST TECHNIQUE: Multidetector CT imaging of the chest was performed using the standard protocol during bolus administration of intravenous contrast. Multiplanar CT image reconstructions and MIPs were obtained to evaluate the vascular anatomy. CONTRAST:  80 cc Isovue 370 COMPARISON:  Chest radiograph May 09, 2016 at 2109 hours and CT chest November 17, 2015 and CT chest May 29, 2015 FINDINGS: CARDIOVASCULAR: Adequate contrast opacification of the pulmonary artery's. Main pulmonary artery is not enlarged. No pulmonary arterial filling defects to the level of the subsegmental branches. Heart size is normal, RIGHT heart strain (RV/ LV 1.3) attributable to patient's emphysema and pneumonia. Severe coronary artery calcifications. No pericardial effusions. Thoracic aorta is normal course and caliber, moderate calcific atherosclerosis of the aortic arch. Probable stenosis LEFT subclavian artery origin, not tailored for evaluation.  MEDIASTINUM/NODES: No lymphadenopathy by CT size criteria. Borderline RIGHT hilar lymphadenopathy is likely reactive. LUNGS/PLEURA: Debris and RIGHT lower lobe bronchus with distal bronchi patchy partially occlusive debris. No pneumothorax. Moderate to severe centrilobular emphysema. Patchy consolidation in the periphery of the RIGHT middle lobe, RIGHT lower lobe. Stable 5 mm RIGHT upper lobe pulmonary nodule. 18 mm spiculated nodular density in LEFT lower lobe, lateral to site of prior 7 mm nodule which are stable. Diffuse interlobular septal thickening. UPPER ABDOMEN: Included view of the abdomen is unremarkable. MUSCULOSKELETAL: Visualized soft tissues and included osseous structures are nonacute. Old anterior rib fractures. Single lumen LEFT humeral head and manubrium. Osteopenia. Review of the MIP images confirms the above findings. IMPRESSION: No acute pulmonary embolism. Multifocal RIGHT greater than LEFT pneumonia, with debris in RIGHT lower lobe bronchus most consistent with aspiration pneumonia. Please note, LEFT lower lobe consolidations is spiculated and, neoplasm not excluded. Recommend 3 month follow-up CT chest to verify improvement. Stable 5 mm RIGHT upper lobe, 7 mm low LEFT lower lobe pulmonary nodules in the setting of moderate to severe emphysema. Electronically Signed   By: Elon Alas M.D.   On: 05/10/2016 00:06    EKG: Independently reviewed. Rate 120, QTc 446, no ST changes.        Assessment/Plan Principal Problem:   Sepsis, unspecified organism ([place]) Active Problems:   DM (diabetes mellitus), type 2, uncontrolled ([place])   CAD: Stents in p-mRCA, p & m Cx, p-mLAD   CAP (community acquired pneumonia)   COPD exacerbation ([place])   Dysphagia with aspiration   Acute on chronic respiratory failure with hypoxia ([name])   Protein-calorie malnutrition, moderate ([name])   Hypotension   Sepsis ([place])  1. Sepsis from pneumonia:  Suspected source pneumonia. Organism unknown.  Patient meets criteria given tachycardia, tachypnea, leukocytosis, and evidence of organ dysfunction.  Lactate normal.  This patient is at high risk of poor outcomes with a qSOFA score of 2.  Antibiotics delivered in the ED.    -Sepsis bundle utilized:  -Blood and urine cultures drawn  -30 ml/kg bolus given in ED, will bolus with additional fluids if needed to maintain MAP > 65  -Start targeted antibiotics with vancomycin and cefepime, based on suspected source of infection (recent history of MRSA pneumonia, aspiration/anaerobe risk)  -Repeat renal function and complete blood count in AM  -Code SEPSIS called to E-link  -Check strep pneumo antigens  -Check procalcitonin  -Obtain sputum culture    -Obtain MRSA nasal swab    2. Elevated troponin: Given CT findings of pneumonia, suspect this is a secondary demand leak.  Had LAD stent and angioplasty in May. -Trend troponin  2. Acute on chronic respiratory failure with hypoxia:  Presents with increased work of breathing, hypoxia from baseline.  From COPD flare and pneumonia.  3. COPD with exacerbation:  -Prednisone daily -Albuterol scheduled and PRN -Continue home breo and Spiriva -Continue PPI -Continue loratadine -Continue Mucinex  4. Hyponatremia:  Hypovolemic on exam. -Bolus IVF first and trend BMP -Check urine sodium and uOsm/sOsm  5. CAD secondary prevention:  -Continue Plavix and aspirin and statin  6. IDDM:  -Continue glargine -SSI with meals  7. Other medications:  -Continue bupropion -Continue cyclobenzaprine and Tramadol PRN -Continue Glucerna     DVT prophylaxis: Lovenox  Code Status: FULL  Family Communication: Daughter at bedside  Disposition Plan: Anticipate IV antibiotics and fluid resuscitation and monitor electrolytes and trend troponin. Consults called: Cardiology via Inbasket Admission status: INPATIENT, stepdown    Medical decision making: Patient seen at 1:20 AM on 05/10/2016.  The patient  was discussed with Dr. Oleta Mouse.  What exists of the patient's chart was reviewed in depth and summarized above.  Clinical condition: requiring close hemodynamic monitoring, ongoing fluid resuscitation, advanced respiratory support.  Mentating well, appropriate for stepdown bed.        Edwin Dada [facility] Pager [phone]

## 2016-05-10 NOTE — Progress Notes (Signed)
ANTICOAGULATION CONSULT NOTE - Initial Consult   Pharmacy Consult for Heparin Indication: chest pain/ACS  Allergies  Allergen Reactions  . Tape Hives and Other (See Comments)    USE PAPER TAPE ONLY- Adhesive peels off skin-makes pt. Raw.  . Isosorbide Other (See Comments)    Drops BP too low  . Nicoderm [Nicotine] Rash    To the PATCH only, breakouts on skin    Patient Measurements: Height: '5\' 5"'$  (165.1 cm) Weight: 129 lb 8 oz (58.7 kg) IBW/kg (Calculated) : 57  Vital Signs: Temp: 98.3 F (36.8 C) (12/08 0830) Temp Source: Oral (12/08 0830) BP: 102/58 (12/08 0830) Pulse Rate: 100 (12/08 0844)  Labs:  Recent Labs  05/09/16 2018 05/10/16 0323 05/10/16 0826  HGB 12.0 10.5*  --   HCT 35.9* 31.9*  --   PLT 270 210  --   CREATININE 1.10* 0.87  --   TROPONINI  --  0.78* 0.69*    Estimated Creatinine Clearance: 58 mL/min (by C-G formula based on SCr of 0.87 mg/dL).   Assessment: Brenda [name] is a 3 yoF who presented with chest pain and shortness of breath x1 day that was unrelieved by nitroglycerin and aspirin prior to admission. CT of chest showed no evidence of PE, however it was concerning for multifocal aspiration pneumonia for which antibiotics have been started. Troponins were elevated on admission at 0.86, now trending down slightly. Chest pain continues, so pharmacy is consulted to start heparin.  Hemoglobin and platelets are stable with no signs or symptoms of bleeding noted. One dose of prophylactic lovenox given, so will withhold heparin bolus. Expect the effects of lovenox to be seen in the first heparin level, so would adjust this level cautiously.  Goal of Therapy:  Heparin level 0.3-0.7 units/ml Monitor platelets by anticoagulation protocol: Yes   Plan:  Heparin 700 units/hr 6 Hour heparin level Daily Heparin Level/CBC Monitor signs/symptoms of bleeding F/U cardiology consult  Dierdre Harness, Cain Sieve, PharmD Clinical Pharmacy Resident [phone]  (Pager) 05/10/2016 9:51 AM

## 2016-05-10 NOTE — Progress Notes (Signed)
Pharmacy Antibiotic Note  Brenda [name] is a 65 y.o. female admitted on 05/09/2016 with chest pain/SOB/PNA.  Pharmacy has been consulted for Vancomycin dosing.  Vancomycin 1 g IV given in ED at  [place]: Vancomycin 1 g IV q24h    Temp (24hrs), Avg:98.9 F (37.2 C), Min:98.9 F (37.2 C), Max:98.9 F (37.2 C)   Recent Labs Lab 05/09/16 2018 05/10/16 0033  WBC 20.4*  --   CREATININE 1.10*  --   LATICACIDVEN  --  0.85    Estimated Creatinine Clearance: 45.9 mL/min (by C-G formula based on SCr of 1.1 mg/dL (H)).    Allergies  Allergen Reactions  . Tape Hives and Other (See Comments)    USE PAPER TAPE ONLY- Adhesive peels off skin-makes pt. Raw.  . Isosorbide Other (See Comments)    Drops BP too low  . Nicoderm [Nicotine] Rash    To the PATCH only, breakouts on skin    [name] 05/10/2016 3:09 AM

## 2016-05-10 NOTE — Progress Notes (Addendum)
PROGRESS NOTE  Brenda [name] [id number] DOB: 1951-04-03 DOA: 05/09/2016 PCP: [name], MD  Brief History:   65 y.o. female with a past medical history significant for COPD on nightly O2, CAD s/p PCI, Tongue cancer s/p rad tx c/b dysphagia and recurrent aspiration pneumonia, and PVD who presents with chest pain and sob x 1 day. The patient described her chest discomfort is dull and stabbing  in sensation substernal in nature without any dizziness, nausea, diaphoresis.  She was tired so she stayed in bed all day, then in the afternoon was sitting at the kitchen table when she had onset of central dull stabbing chest pain, radiating to the back. The patient took nitroglycerin spray without much relief.  This gradually worsened, and didn't improve with nitro or aspirin, so the family brought her to the ER.  CT angiogram chest at the time of admission negative for pulmonary embolus but showed multifocal opacities.  More specifically, there was right lower lobe patchy occlusive debris, RML consolidation, and LLL spiculated opacity. WBC was 20.4 with lactic acid 0.85. The patient had hypotension which improved with fluid resuscitation. The patient was started on vancomycin and cefepime. She continues to have substernal chest discomfort on the morning of 05/10/2016. Heparin drip was started, and cardiology was consulted.  Assessment/Plan: Sepsis -Secondary to aspiration pneumonia -Continue intravenous antibotics -d/c vanco and cefepime -start zosyn -Sepsis physiology improved although the patient continues to have soft blood pressure  Aspiration pneumonia -The patient has chronic dysphagia secondary to her tongue cancer with history of XRT -Speech therapy reevaluation  Unstable angina/Elevated troponin with established CAD -05/10/2016--continues to have chest discomfort -Start heparin drip -Consult cardiology -10/06/15 cath--Severe disease upstream and downstream from the LAD  stent with moderate in-stent restenosis in the LAD. These lesions were covered in tandem with a single DES stent. Also severe ostial to proximal RPDA stenosis treated with PTCA/POBA (plain old balloon angioplasty) only   Acute on chronic respiratory failure with hypoxia -Secondary aspiration pneumonia in the setting of COPD -Presently stable on 2 L nasal cannula -At baseline, patient is on 2 L at night only  COPD exacerbation -Continue prednisone -Continue bronchodilators.  -stable on 2 L  Diabetes mellitus type 2, insulin-dependent -CBG is elevated secondary to steroids -Continue Lantus -Normal sliding scale  Hyperlipidemia  -Continue Crestor   Disposition Plan:   Home in 2-3 days  Family Communication:  No Family at bedside--Total time spent 32 minutes.  Greater than 50% spent face to face counseling and coordinating care.  0845 to 0917  Consultants:  cardiology  Code Status:  FULL  DVT Prophylaxis:  IV Heparin   Procedures: As Listed in Progress Note Above  Antibiotics: None    Subjective:  patient continues to have substernal chest discomfort. She states her breathing is little better than yesterday. she states that her substernal chest discomfort is dull in nature and is worse than yesterday. Denies any hemoptysis, nausea, vomiting, diarrhea. No abdominal pain dysuria, hematuria. No fevers or chills.  Objective: Vitals:   05/10/16 0745 05/10/16 0800 05/10/16 0830 05/10/16 0844  BP: 120/69 100/63 (!) 102/58   Pulse: 92 91 93 100  Resp: (!) 0 (!) 0 14 (!) 27  Temp:   98.3 F (36.8 C)   TempSrc:   Oral   SpO2: 99% 99% 97% 96%  Weight:      Height:        Intake/Output Summary (Last 24 hours)  at 05/10/16 0902 Last data filed at 05/10/16 0800  Gross per 24 hour  Intake          3013.33 ml  Output                0 ml  Net          3013.33 ml   Weight change:  Exam:   General:  Pt is alert, follows commands appropriately, not in acute  distress  HEENT: No icterus, No thrush, No neck mass, [place]/AT  Cardiovascular: RRR, S1/S2, no rubs, no gallops  Respiratory: Bibasilar rales, right greater than left. No wheezing. Diminished BS bilateral.   Abdomen: Soft/+BS, non tender, non distended, no guarding  Extremities: No edema, No lymphangitis, No petechiae, No rashes, no synovitis   Data Reviewed: I have personally reviewed following labs and imaging studies Basic Metabolic Panel:  Recent Labs Lab 05/09/16 2018 05/10/16 0323  NA 126* 131*  K 4.1 4.1  CL 91* 99*  CO2 24 24  GLUCOSE 274* 258*  BUN 17 16  CREATININE 1.10* 0.87  CALCIUM 8.9 7.7*   Liver Function Tests:  Recent Labs Lab 05/10/16 0323  AST 20  ALT 16  ALKPHOS 175*  BILITOT 1.7*  PROT 5.0*  ALBUMIN 2.3*   No results for input(s): LIPASE, AMYLASE in the last 168 hours. No results for input(s): AMMONIA in the last 168 hours. Coagulation Profile: No results for input(s): INR, PROTIME in the last 168 hours. CBC:  Recent Labs Lab 05/09/16 2018 05/10/16 0323  WBC 20.4* 14.1*  HGB 12.0 10.5*  HCT 35.9* 31.9*  MCV 90.9 90.4  PLT 270 210   Cardiac Enzymes:  Recent Labs Lab 05/10/16 0323  TROPONINI 0.78*   BNP: Invalid input(s): POCBNP CBG:  Recent Labs Lab 05/10/16 0309  GLUCAP 214*   HbA1C: No results for input(s): HGBA1C in the last 72 hours. Urine analysis:    Component Value Date/Time   COLORURINE AMBER (A) 05/28/2015 1529   APPEARANCEUR CLOUDY (A) 05/28/2015 1529   LABSPEC 1.021 05/28/2015 1529   PHURINE 5.5 05/28/2015 1529   GLUCOSEU >1000 (A) 05/28/2015 1529   HGBUR NEGATIVE 05/28/2015 1529   BILIRUBINUR SMALL (A) 05/28/2015 1529   KETONESUR NEGATIVE 05/28/2015 1529   PROTEINUR 30 (A) 05/28/2015 1529   UROBILINOGEN 1.0 01/17/2015 1627   NITRITE NEGATIVE 05/28/2015 1529   LEUKOCYTESUR NEGATIVE 05/28/2015 1529   Sepsis Labs: '@LABRCNTIP'$ (procalcitonin:4,lacticidven:4) ) Recent Results (from the past 240  hour(s))  MRSA PCR Screening     Status: None   Collection Time: 05/10/16  2:55 AM  Result Value Ref Range Status   MRSA by PCR NEGATIVE NEGATIVE Final    Comment:        The GeneXpert MRSA Assay (FDA approved for NASAL specimens only), is one component of a comprehensive MRSA colonization surveillance program. It is not intended to diagnose MRSA infection nor to guide or monitor treatment for MRSA infections.      Scheduled Meds: . albuterol  2.5 mg Nebulization Q6H  . aspirin EC  81 mg Oral Daily  . buPROPion  300 mg Oral Daily  . ceFEPime (MAXIPIME) IV  1 g Intravenous BID  . clopidogrel  75 mg Oral Daily  . enoxaparin (LOVENOX) injection  40 mg Subcutaneous Daily  . feeding supplement (GLUCERNA SHAKE)  237 mL Oral BID BM  . fluticasone furoate-vilanterol  1 puff Inhalation Daily  . guaiFENesin  1,200 mg Oral BID  . insulin aspart  0-5 Units Subcutaneous  QHS  . insulin aspart  0-9 Units Subcutaneous TID WC  . insulin glargine  5 Units Subcutaneous QHS  . mouth rinse  15 mL Mouth Rinse BID  . pantoprazole  40 mg Oral Daily  . polyethylene glycol  17 g Oral BID  . predniSONE  40 mg Oral Q breakfast  . rosuvastatin  20 mg Oral Daily  . sodium chloride flush  3 mL Intravenous Q12H  . tiotropium  18 mcg Inhalation Daily  . vancomycin  1,000 mg Intravenous Q24H   Continuous Infusions: . sodium chloride 100 mL/hr at 05/10/16 4128    Procedures/Studies: Dg Chest 2 View  Result Date: 05/09/2016 CLINICAL DATA:  Dyspnea and chest pain today. EXAM: CHEST  2 VIEW COMPARISON:  04/15/2016 FINDINGS: Diffuse interstitial prominence, worsened. This is particularly evident in the basilar periphery. There is patchy airspace opacity in the right upper lobe inferiorly. No pleural effusions. Hilar and mediastinal contours are unremarkable and unchanged. Baseline emphysematous disease appears severe, upper lobe predominant. IMPRESSION: The findings most likely represent congestive heart  failure with interstitial edema and a mild degree of asymmetric alveolar edema in the right upper lobe, superimposed on severe emphysematous disease. Cannot exclude infectious infiltrate. Electronically Signed   By: Andreas Newport M.D.   On: 05/09/2016 21:42   Dg Chest 2 View  Result Date: 04/15/2016 CLINICAL DATA:  COPD exacerbation EXAM: CHEST  2 VIEW COMPARISON:  Chest radiograph 04/08/2016 FINDINGS: There is improved aeration of the right lung base. There are persistent diffusely increased pulmonary markings and hyperinflation of the lungs. Mild aortic arch atherosclerosis. Cardiomediastinal contours are otherwise normal. No new area of consolidation. No evidence of pulmonary edema. No pleural effusion or pneumothorax. IMPRESSION: 1. Improved aeration of the right lung base. No focal airspace consolidation. 2. COPD. 3. Aortic atherosclerosis. Electronically Signed   By: Ulyses Jarred M.D.   On: 04/15/2016 16:53   Dg Lumbar Spine Complete  Result Date: 05/01/2016 CLINICAL DATA:  Low back pain over the last 4 days. EXAM: LUMBAR SPINE - COMPLETE 4+ VIEW COMPARISON:  None. FINDINGS: Five lumbar type vertebral bodies show normal alignment. There is a superior endplate fracture at N86 with loss of height of 20%. This is newly seen since a CT of 02/19/2016. No disc space narrowing. Mild lower lumbar facet osteoarthritis. Large amount a gas and fecal matter throughout the colon. Multiple renal calculi on the right. Probable single renal calculus on the left. Aortic atherosclerosis. IMPRESSION: Superior endplate fracture at V67, likely acute or subacute. No retropulsed bone. Large amount of gas and fecal matter throughout the colon. Aortic atherosclerosis. Renal stone disease. Electronically Signed   By: Nelson Chimes M.D.   On: 05/01/2016 14:16   Ct Angio Chest Pe W And/or Wo Contrast  Result Date: 05/10/2016 CLINICAL DATA:  Chest pain and shortness of breath tonight. History of COPD, diabetes, head and  neck cancer. EXAM: CT ANGIOGRAPHY CHEST WITH CONTRAST TECHNIQUE: Multidetector CT imaging of the chest was performed using the standard protocol during bolus administration of intravenous contrast. Multiplanar CT image reconstructions and MIPs were obtained to evaluate the vascular anatomy. CONTRAST:  80 cc Isovue 370 COMPARISON:  Chest radiograph May 09, 2016 at 2109 hours and CT chest November 17, 2015 and CT chest May 29, 2015 FINDINGS: CARDIOVASCULAR: Adequate contrast opacification of the pulmonary artery's. Main pulmonary artery is not enlarged. No pulmonary arterial filling defects to the level of the subsegmental branches. Heart size is normal, RIGHT heart strain (RV/ LV 1.3)  attributable to patient's emphysema and pneumonia. Severe coronary artery calcifications. No pericardial effusions. Thoracic aorta is normal course and caliber, moderate calcific atherosclerosis of the aortic arch. Probable stenosis LEFT subclavian artery origin, not tailored for evaluation. MEDIASTINUM/NODES: No lymphadenopathy by CT size criteria. Borderline RIGHT hilar lymphadenopathy is likely reactive. LUNGS/PLEURA: Debris and RIGHT lower lobe bronchus with distal bronchi patchy partially occlusive debris. No pneumothorax. Moderate to severe centrilobular emphysema. Patchy consolidation in the periphery of the RIGHT middle lobe, RIGHT lower lobe. Stable 5 mm RIGHT upper lobe pulmonary nodule. 18 mm spiculated nodular density in LEFT lower lobe, lateral to site of prior 7 mm nodule which are stable. Diffuse interlobular septal thickening. UPPER ABDOMEN: Included view of the abdomen is unremarkable. MUSCULOSKELETAL: Visualized soft tissues and included osseous structures are nonacute. Old anterior rib fractures. Single lumen LEFT humeral head and manubrium. Osteopenia. Review of the MIP images confirms the above findings. IMPRESSION: No acute pulmonary embolism. Multifocal RIGHT greater than LEFT pneumonia, with debris in RIGHT  lower lobe bronchus most consistent with aspiration pneumonia. Please note, LEFT lower lobe consolidations is spiculated and, neoplasm not excluded. Recommend 3 month follow-up CT chest to verify improvement. Stable 5 mm RIGHT upper lobe, 7 mm low LEFT lower lobe pulmonary nodules in the setting of moderate to severe emphysema. Electronically Signed   By: Elon Alas M.D.   On: 05/10/2016 00:06    Luwanna Brossman, DO  [facility] Pager [phone]  If 7PM-7AM, please contact night-coverage [email or web address] Password [email or web address] 05/10/2016, 9:02 AM   LOS: 0 days

## 2016-05-10 NOTE — ED Notes (Signed)
Dr. Danford at bedside  

## 2016-05-11 ENCOUNTER — Inpatient Hospital Stay (HOSPITAL_COMMUNITY): Payer: Medicare Other

## 2016-05-11 DIAGNOSIS — J13 Pneumonia due to Streptococcus pneumoniae: Secondary | ICD-10-CM

## 2016-05-11 DIAGNOSIS — I2510 Atherosclerotic heart disease of native coronary artery without angina pectoris: Secondary | ICD-10-CM

## 2016-05-11 DIAGNOSIS — I209 Angina pectoris, unspecified: Secondary | ICD-10-CM

## 2016-05-11 DIAGNOSIS — I200 Unstable angina: Secondary | ICD-10-CM

## 2016-05-11 DIAGNOSIS — J690 Pneumonitis due to inhalation of food and vomit: Secondary | ICD-10-CM

## 2016-05-11 LAB — BASIC METABOLIC PANEL
Anion gap: 6 (ref 5–15)
BUN: 14 mg/dL (ref 6–20)
CHLORIDE: 101 mmol/L (ref 101–111)
CO2: 25 mmol/L (ref 22–32)
CREATININE: 0.74 mg/dL (ref 0.44–1.00)
Calcium: 8.1 mg/dL — ABNORMAL LOW (ref 8.9–10.3)
Glucose, Bld: 312 mg/dL — ABNORMAL HIGH (ref 65–99)
POTASSIUM: 4.5 mmol/L (ref 3.5–5.1)
SODIUM: 132 mmol/L — AB (ref 135–145)

## 2016-05-11 LAB — GLUCOSE, CAPILLARY
GLUCOSE-CAPILLARY: 210 mg/dL — AB (ref 65–99)
GLUCOSE-CAPILLARY: 307 mg/dL — AB (ref 65–99)
Glucose-Capillary: 272 mg/dL — ABNORMAL HIGH (ref 65–99)
Glucose-Capillary: 306 mg/dL — ABNORMAL HIGH (ref 65–99)

## 2016-05-11 LAB — CBC
HEMATOCRIT: 30.5 % — AB (ref 36.0–46.0)
Hemoglobin: 10.1 g/dL — ABNORMAL LOW (ref 12.0–15.0)
MCH: 29.9 pg (ref 26.0–34.0)
MCHC: 33.1 g/dL (ref 30.0–36.0)
MCV: 90.2 fL (ref 78.0–100.0)
PLATELETS: 177 10*3/uL (ref 150–400)
RBC: 3.38 MIL/uL — AB (ref 3.87–5.11)
RDW: 15.6 % — ABNORMAL HIGH (ref 11.5–15.5)
WBC: 7.3 10*3/uL (ref 4.0–10.5)

## 2016-05-11 LAB — HEPARIN LEVEL (UNFRACTIONATED)
Heparin Unfractionated: 0.18 IU/mL — ABNORMAL LOW (ref 0.30–0.70)
Heparin Unfractionated: 0.22 IU/mL — ABNORMAL LOW (ref 0.30–0.70)

## 2016-05-11 MED ORDER — INSULIN GLARGINE 100 UNIT/ML ~~LOC~~ SOLN
10.0000 [IU] | Freq: Every day | SUBCUTANEOUS | Status: DC
  Administered 2016-05-11: 10 [IU] via SUBCUTANEOUS
  Filled 2016-05-11 (×2): qty 0.1

## 2016-05-11 MED ORDER — IPRATROPIUM-ALBUTEROL 0.5-2.5 (3) MG/3ML IN SOLN
3.0000 mL | Freq: Three times a day (TID) | RESPIRATORY_TRACT | Status: DC
  Administered 2016-05-11 – 2016-05-14 (×10): 3 mL via RESPIRATORY_TRACT
  Filled 2016-05-11 (×11): qty 3

## 2016-05-11 NOTE — Progress Notes (Signed)
[place] for Heparin Indication: chest pain/ACS  Allergies  Allergen Reactions  . Tape Hives and Other (See Comments)    USE PAPER TAPE ONLY- Adhesive peels off skin-makes pt. Raw.  . Isosorbide Other (See Comments)    Drops BP too low  . Nicoderm [Nicotine] Rash    To the PATCH only, breakouts on skin    Patient Measurements: Height: '5\' 5"'$  (165.1 cm) Weight: 129 lb 8 oz (58.7 kg) IBW/kg (Calculated) : 57  Vital Signs: Temp: 98.1 F (36.7 C) (12/09 1956) Temp Source: Oral (12/09 1956) BP: 133/77 (12/09 1956) Pulse Rate: 87 (12/09 1553)  Labs:  Recent Labs  05/09/16 2018 05/10/16 0323 05/10/16 0826 05/10/16 1446  05/11/16 0313 05/11/16 1228 05/11/16 2032  HGB 12.0 10.5*  --   --   --  10.1*  --   --   HCT 35.9* 31.9*  --   --   --  30.5*  --   --   PLT 270 210  --   --   --  177  --   --   HEPARINUNFRC  --   --   --   --   < > 0.18* <0.10* 0.22*  CREATININE 1.10* 0.87  --   --   --  0.74  --   --   TROPONINI  --  0.78* 0.69* 0.73*  --   --   --   --   < > = values in this interval not displayed.  Estimated Creatinine Clearance: 63.1 mL/min (by C-G formula based on SCr of 0.74 mg/dL).   Assessment: 65 y.o. female with chest pain continues on heparin PM heparin level = 0.22  Goal of Therapy:  Heparin level 0.3-0.7 units/ml Monitor platelets by anticoagulation protocol: Yes   Plan:  Increase Heparin 1200 units/hr AM heparin level, CBC    DC heparin soon?  Thank you Anette Guarneri, PharmD [phone]  05/11/2016 9:16 PM

## 2016-05-11 NOTE — Progress Notes (Signed)
Brenda NOTE  [name] IDP:[id number] DOB: [birth year] DOA: 05/09/2016 PCP: [name], MD  Brief History:  65 y.o.femalewith a past medical history significant for COPD on nightly O2, CAD s/p PCI, Tongue cancer s/p rad tx c/b dysphagia and recurrent aspiration pneumonia, and PVDwho presents with chest pain and sob x 1 day. The patient described her chest discomfort is dull and stabbing  in sensation substernal in nature without any dizziness, nausea, diaphoresis. She was tired so she stayed in bed all day, then in the afternoon was sitting at the kitchen table when she had onset of central dull stabbing chest pain, radiating to the back. The patient took nitroglycerin spray without much relief. This gradually worsened, and didn't improve with nitro or aspirin, so the family brought her to the ER.  CT angiogram chest at the time of admission negative for pulmonary embolus but showed multifocal opacities.  More specifically, there was right lower lobe patchy occlusive debris, RML consolidation, and LLL spiculated opacity. WBC was 20.4 with lactic acid 0.85. The patient had hypotension which improved with fluid resuscitation. The patient was started on vancomycin and cefepime. She continues to have substernal chest discomfort on the morning of 05/10/2016. Heparin drip was started, and cardiology was consulted.  Assessment/Plan: Sepsis -Secondary to aspiration pneumonia -Continue intravenous antibotics -d/c vanco and cefepime -continue zosyn -Sepsis physiology improved   Aspiration pneumonia -The patient has chronic dysphagia secondary to her tongue cancer with history of XRT -Speech therapy reevaluation -continue zosyn  Unstable angina/Elevated troponin with established CAD -05/11/2016--chest pain improved -Continue heparin drip -appreciate cardiology -10/06/15 cath--Severe disease upstream and downstream from the LAD stent with moderate in-stent restenosis in the  LAD. These lesions were covered in tandem with a single DES stent. Also severe ostial to proximal RPDA stenosis treated with PTCA/POBA (plain old balloon angioplasty) only   Acute on chronic respiratory failure with hypoxia -Secondary aspiration pneumonia in the setting of COPD -Presently stable on 2 L nasal cannula -At baseline, patient is on 2 L at night only  COPD exacerbation -Continue prednisone -Continue bronchodilators.  -stable on 2 L  Diabetes mellitus type 2, insulin-dependent -CBG is elevated secondary to steroids -Increase Lantus to 10 units -continue sliding scale insulin  Hyperlipidemia  -Continue Crestor   LLL spiculated nodular density -Repeat CT chest in 3 months  Disposition Plan:   Home in 2-3 days  Family Communication:  No Family at bedside  Consultants:  cardiology  Code Status:  FULL  DVT Prophylaxis:  IV Heparin   Procedures: As Listed in Brenda Note Above  Antibiotics: vanco 12/8 Cefepime 12/8 Zosyn 12/8>>>   Subjective: Patient denies fevers, chills, headache, chest pain, dyspnea, nausea, vomiting, diarrhea, abdominal pain, dysuria, hematuria, hematochezia, and melena.   Objective: Vitals:   05/11/16 0315 05/11/16 0800 05/11/16 0857 05/11/16 1156  BP: 128/60   (!) 149/72  Pulse: 89   (!) 102  Resp: 14   (!) 29  Temp: 98.2 F (36.8 C) 97.7 F (36.5 C)  98 F (36.7 C)  TempSrc: Oral Oral  Oral  SpO2: 97%  94% 94%  Weight:      Height:        Intake/Output Summary (Last 24 hours) at 05/11/16 1328 Last data filed at 05/11/16 0900  Gross per 24 hour  Intake           2501.6 ml  Output  0 ml  Net           2501.6 ml   Weight change:  Exam:   General:  Pt is alert, follows commands appropriately, not in acute distress  HEENT: No icterus, No thrush, No neck mass, [place]/AT  Cardiovascular: RRR, S1/S2, no rubs, no gallops  Respiratory: Bibasilar rales, right greater than left. No wheezing. Good air  movement.  Abdomen: Soft/+BS, non tender, non distended, no guarding  Extremities: No edema, No lymphangitis, No petechiae, No rashes, no synovitis   Data Reviewed: I have personally reviewed following labs and imaging studies Basic Metabolic Panel:  Recent Labs Lab 05/09/16 2018 05/10/16 0323 05/11/16 0313  NA 126* 131* 132*  K 4.1 4.1 4.5  CL 91* 99* 101  CO2 '24 24 25  '$ GLUCOSE 274* 258* 312*  BUN '17 16 14  '$ CREATININE 1.10* 0.87 0.74  CALCIUM 8.9 7.7* 8.1*   Liver Function Tests:  Recent Labs Lab 05/10/16 0323  AST 20  ALT 16  ALKPHOS 175*  BILITOT 1.7*  PROT 5.0*  ALBUMIN 2.3*   No results for input(s): LIPASE, AMYLASE in the last 168 hours. No results for input(s): AMMONIA in the last 168 hours. Coagulation Profile: No results for input(s): INR, PROTIME in the last 168 hours. CBC:  Recent Labs Lab 05/09/16 2018 05/10/16 0323 05/11/16 0313  WBC 20.4* 14.1* 7.3  HGB 12.0 10.5* 10.1*  HCT 35.9* 31.9* 30.5*  MCV 90.9 90.4 90.2  PLT 270 210 177   Cardiac Enzymes:  Recent Labs Lab 05/10/16 0323 05/10/16 0826 05/10/16 1446  TROPONINI 0.78* 0.69* 0.73*   BNP: Invalid input(s): POCBNP CBG:  Recent Labs Lab 05/10/16 1231 05/10/16 1637 05/10/16 1948 05/11/16 0913 05/11/16 1256  GLUCAP 214* 211* 180* 210* 272*   HbA1C: No results for input(s): HGBA1C in the last 72 hours. Urine analysis:    Component Value Date/Time   COLORURINE AMBER (A) 05/28/2015 1529   APPEARANCEUR CLOUDY (A) 05/28/2015 1529   LABSPEC 1.021 05/28/2015 1529   PHURINE 5.5 05/28/2015 1529   GLUCOSEU >1000 (A) 05/28/2015 1529   HGBUR NEGATIVE 05/28/2015 1529   BILIRUBINUR SMALL (A) 05/28/2015 1529   KETONESUR NEGATIVE 05/28/2015 1529   PROTEINUR 30 (A) 05/28/2015 1529   UROBILINOGEN 1.0 01/17/2015 1627   NITRITE NEGATIVE 05/28/2015 1529   LEUKOCYTESUR NEGATIVE 05/28/2015 1529   Sepsis Labs: '@LABRCNTIP'$ (procalcitonin:4,lacticidven:4) ) Recent Results (from the past  240 hour(s))  MRSA PCR Screening     Status: None   Collection Time: 05/10/16  2:55 AM  Result Value Ref Range Status   MRSA by PCR NEGATIVE NEGATIVE Final    Comment:        The GeneXpert MRSA Assay (FDA approved for NASAL specimens only), is one component of a comprehensive MRSA colonization surveillance program. It is not intended to diagnose MRSA infection nor to guide or monitor treatment for MRSA infections.      Scheduled Meds: . aspirin EC  81 mg Oral Daily  . buPROPion  300 mg Oral Daily  . clopidogrel  75 mg Oral Daily  . feeding supplement (GLUCERNA SHAKE)  237 mL Oral BID BM  . fluticasone furoate-vilanterol  1 puff Inhalation Daily  . guaiFENesin  1,200 mg Oral BID  . insulin aspart  0-5 Units Subcutaneous QHS  . insulin aspart  0-9 Units Subcutaneous TID WC  . insulin glargine  10 Units Subcutaneous QHS  . ipratropium-albuterol  3 mL Nebulization TID  . mouth rinse  15 mL Mouth Rinse  BID  . pantoprazole  40 mg Oral Daily  . piperacillin-tazobactam (ZOSYN)  IV  3.375 g Intravenous Q8H  . polyethylene glycol  17 g Oral BID  . predniSONE  40 mg Oral Q breakfast  . rosuvastatin  20 mg Oral Daily  . sodium chloride flush  3 mL Intravenous Q12H   Continuous Infusions: . heparin 850 Units/hr (05/11/16 0424)    Procedures/Studies: Dg Chest 2 View  Result Date: 05/09/2016 CLINICAL DATA:  Dyspnea and chest pain today. EXAM: CHEST  2 VIEW COMPARISON:  04/15/2016 FINDINGS: Diffuse interstitial prominence, worsened. This is particularly evident in the basilar periphery. There is patchy airspace opacity in the right upper lobe inferiorly. No pleural effusions. Hilar and mediastinal contours are unremarkable and unchanged. Baseline emphysematous disease appears severe, upper lobe predominant. IMPRESSION: The findings most likely represent congestive heart failure with interstitial edema and a mild degree of asymmetric alveolar edema in the right upper lobe, superimposed on  severe emphysematous disease. Cannot exclude infectious infiltrate. Electronically Signed   By: Andreas Newport M.D.   On: 05/09/2016 21:42   Dg Chest 2 View  Result Date: 04/15/2016 CLINICAL DATA:  COPD exacerbation EXAM: CHEST  2 VIEW COMPARISON:  Chest radiograph 04/08/2016 FINDINGS: There is improved aeration of the right lung base. There are persistent diffusely increased pulmonary markings and hyperinflation of the lungs. Mild aortic arch atherosclerosis. Cardiomediastinal contours are otherwise normal. No new area of consolidation. No evidence of pulmonary edema. No pleural effusion or pneumothorax. IMPRESSION: 1. Improved aeration of the right lung base. No focal airspace consolidation. 2. COPD. 3. Aortic atherosclerosis. Electronically Signed   By: Ulyses Jarred M.D.   On: 04/15/2016 16:53   Dg Lumbar Spine Complete  Result Date: 05/01/2016 CLINICAL DATA:  Low back pain over the last 4 days. EXAM: LUMBAR SPINE - COMPLETE 4+ VIEW COMPARISON:  None. FINDINGS: Five lumbar type vertebral bodies show normal alignment. There is a superior endplate fracture at N62 with loss of height of 20%. This is newly seen since a CT of 02/19/2016. No disc space narrowing. Mild lower lumbar facet osteoarthritis. Large amount a gas and fecal matter throughout the colon. Multiple renal calculi on the right. Probable single renal calculus on the left. Aortic atherosclerosis. IMPRESSION: Superior endplate fracture at X52, likely acute or subacute. No retropulsed bone. Large amount of gas and fecal matter throughout the colon. Aortic atherosclerosis. Renal stone disease. Electronically Signed   By: Nelson Chimes M.D.   On: 05/01/2016 14:16   Ct Angio Chest Pe W And/or Wo Contrast  Result Date: 05/10/2016 CLINICAL DATA:  Chest pain and shortness of breath tonight. History of COPD, diabetes, head and neck cancer. EXAM: CT ANGIOGRAPHY CHEST WITH CONTRAST TECHNIQUE: Multidetector CT imaging of the chest was performed  using the standard protocol during bolus administration of intravenous contrast. Multiplanar CT image reconstructions and MIPs were obtained to evaluate the vascular anatomy. CONTRAST:  80 cc Isovue 370 COMPARISON:  Chest radiograph May 09, 2016 at 2109 hours and CT chest November 17, 2015 and CT chest May 29, 2015 FINDINGS: CARDIOVASCULAR: Adequate contrast opacification of the pulmonary artery's. Main pulmonary artery is not enlarged. No pulmonary arterial filling defects to the level of the subsegmental branches. Heart size is normal, RIGHT heart strain (RV/ LV 1.3) attributable to patient's emphysema and pneumonia. Severe coronary artery calcifications. No pericardial effusions. Thoracic aorta is normal course and caliber, moderate calcific atherosclerosis of the aortic arch. Probable stenosis LEFT subclavian artery origin, not tailored for  evaluation. MEDIASTINUM/NODES: No lymphadenopathy by CT size criteria. Borderline RIGHT hilar lymphadenopathy is likely reactive. LUNGS/PLEURA: Debris and RIGHT lower lobe bronchus with distal bronchi patchy partially occlusive debris. No pneumothorax. Moderate to severe centrilobular emphysema. Patchy consolidation in the periphery of the RIGHT middle lobe, RIGHT lower lobe. Stable 5 mm RIGHT upper lobe pulmonary nodule. 18 mm spiculated nodular density in LEFT lower lobe, lateral to site of prior 7 mm nodule which are stable. Diffuse interlobular septal thickening. UPPER ABDOMEN: Included view of the abdomen is unremarkable. MUSCULOSKELETAL: Visualized soft tissues and included osseous structures are nonacute. Old anterior rib fractures. Single lumen LEFT humeral head and manubrium. Osteopenia. Review of the MIP images confirms the above findings. IMPRESSION: No acute pulmonary embolism. Multifocal RIGHT greater than LEFT pneumonia, with debris in RIGHT lower lobe bronchus most consistent with aspiration pneumonia. Please note, LEFT lower lobe consolidations is  spiculated and, neoplasm not excluded. Recommend 3 month follow-up CT chest to verify improvement. Stable 5 mm RIGHT upper lobe, 7 mm low LEFT lower lobe pulmonary nodules in the setting of moderate to severe emphysema. Electronically Signed   By: Elon Alas M.D.   On: 05/10/2016 00:06   Dg Abd 2 Views  Result Date: 05/11/2016 CLINICAL DATA:  Abdominal pain. Patient reports no bowel movement for 2 weeks. EXAM: ABDOMEN - 2 VIEW COMPARISON:  None. FINDINGS: Large volume of stool throughout the entire colon. No definite rectal distention. No bowel dilatation to suggest obstruction. No abnormal gastric distension, small gastric air-fluid level. Scattered oblong radiopaque densities throughout the abdomen are consistent with ingested pills. No free air. Atherosclerosis and vascular stents are noted. There is excreted intravenous contrast within the urinary bladder from chest CT performed 2 days prior. Blunting of both costophrenic angles, with mild progression. Bones are under mineralized. IMPRESSION: Large volume of colonic stool consistent with constipation. Multiple ingested pills scattered throughout the abdomen. No bowel obstruction. Electronically Signed   By: Jeb Levering M.D.   On: 05/11/2016 02:08    Helga Asbury, DO  [facility] Pager [phone]  If 7PM-7AM, please contact night-coverage [email or web address] Password [email or web address] 05/11/2016, 1:28 PM   LOS: 1 day

## 2016-05-11 NOTE — Progress Notes (Signed)
[facility] for Heparin Indication: chest pain/ACS  Allergies  Allergen Reactions  . Tape Hives and Other (See Comments)    USE PAPER TAPE ONLY- Adhesive peels off skin-makes pt. Raw.  . Isosorbide Other (See Comments)    Drops BP too low  . Nicoderm [Nicotine] Rash    To the PATCH only, breakouts on skin    Patient Measurements: Height: '5\' 5"'$  (165.1 cm) Weight: 129 lb 8 oz (58.7 kg) IBW/kg (Calculated) : 57  Vital Signs: Temp: 98 F (36.7 C) (12/09 1156) Temp Source: Oral (12/09 1156) BP: 149/72 (12/09 1156) Pulse Rate: 102 (12/09 1156)  Labs:  Recent Labs  05/09/16 2018 05/10/16 0323 05/10/16 0826 05/10/16 1446 05/10/16 1629 05/11/16 0313 05/11/16 1228  HGB 12.0 10.5*  --   --   --  10.1*  --   HCT 35.9* 31.9*  --   --   --  30.5*  --   PLT 270 210  --   --   --  177  --   HEPARINUNFRC  --   --   --   --  0.36 0.18* <0.10*  CREATININE 1.10* 0.87  --   --   --  0.74  --   TROPONINI  --  0.78* 0.69* 0.73*  --   --   --     Estimated Creatinine Clearance: 63.1 mL/min (by C-G formula based on SCr of 0.74 mg/dL).   Assessment: 65 y.o. female with chest pain continues on heparin Heparin level now < 0.10  Goal of Therapy:  Heparin level 0.3-0.7 units/ml Monitor platelets by anticoagulation protocol: Yes   Plan:  Increase Heparin 1050 units/hr Check heparin level in 8 hours.    DC heparin soon?  Thank you Anette Guarneri, PharmD [phone]  05/11/2016 2:04 PM

## 2016-05-11 NOTE — Progress Notes (Signed)
Patient Name: Brenda [name] Date of Encounter: 05/11/2016  Primary Cardiologist:   Dr. Beckie Salts Problem List     Principal Problem:   Sepsis, unspecified organism [facility]) Active Problems:   DM (diabetes mellitus), type 2, uncontrolled ([place])   CAD: Stents in p-mRCA, p & m Cx, p-mLAD   CAP (community acquired pneumonia)   COPD exacerbation ([place])   Dysphagia with aspiration   Acute on chronic respiratory failure with hypoxia ([place])   Protein-calorie malnutrition, moderate ([name])   Hypotension   Sepsis ([place])   Lobar pneumonia ([place])     Subjective   Chest pain improved.  Breathing improved.    Inpatient Medications    Scheduled Meds: . aspirin EC  81 mg Oral Daily  . buPROPion  300 mg Oral Daily  . clopidogrel  75 mg Oral Daily  . feeding supplement (GLUCERNA SHAKE)  237 mL Oral BID BM  . fluticasone furoate-vilanterol  1 puff Inhalation Daily  . guaiFENesin  1,200 mg Oral BID  . insulin aspart  0-5 Units Subcutaneous QHS  . insulin aspart  0-9 Units Subcutaneous TID WC  . insulin glargine  5 Units Subcutaneous QHS  . ipratropium-albuterol  3 mL Nebulization TID  . mouth rinse  15 mL Mouth Rinse BID  . pantoprazole  40 mg Oral Daily  . piperacillin-tazobactam (ZOSYN)  IV  3.375 g Intravenous Q8H  . polyethylene glycol  17 g Oral BID  . predniSONE  40 mg Oral Q breakfast  . rosuvastatin  20 mg Oral Daily  . sodium chloride flush  3 mL Intravenous Q12H   Continuous Infusions: . sodium chloride 75 mL/hr at 05/10/16 2322  . heparin 850 Units/hr (05/11/16 0424)   PRN Meds: acetaminophen **OR** acetaminophen, albuterol, cyclobenzaprine, ondansetron **OR** ondansetron (ZOFRAN) IV, traMADol   Vital Signs    Vitals:   05/10/16 2321 05/11/16 0315 05/11/16 0800 05/11/16 0857  BP: 123/83 128/60    Pulse: (!) 103 89    Resp: 10 14    Temp: 97.9 F (36.6 C) 98.2 F (36.8 C) 97.7 F (36.5 C)   TempSrc: Oral Oral Oral   SpO2: 95% 97%  94%  Weight:        Height:        Intake/Output Summary (Last 24 hours) at 05/11/16 1007 Last data filed at 05/11/16 0900  Gross per 24 hour  Intake           2501.6 ml  Output                0 ml  Net           2501.6 ml   Filed Weights   05/10/16 0303  Weight: 129 lb 8 oz (58.7 kg)    Physical Exam    GEN: NAD.  Neck:   JVD Cardiac:  Regular Rate and Rhythm, no murmurs, rubs, or gallops.  No edema.  Radials/DP/PT 2+  and equal bilaterally.  Respiratory:  Respirations decreased breath sounds bilaterally with course crackles.  GI: Soft, nontender, nondistended, BS + x 4. Skin: warm and dry, no rash. Neuro:   Strength and sensation are intact. Psych:  AAOx3.  Normal affect.  Labs    CBC  Recent Labs  05/10/16 0323 05/11/16 0313  WBC 14.1* 7.3  HGB 10.5* 10.1*  HCT 31.9* 30.5*  MCV 90.4 90.2  PLT 210 229   Basic Metabolic Panel  Recent Labs  05/10/16 0323 05/11/16 0313  NA 131* 132*  K 4.1 4.5  CL 99* 101  CO2 24 25  GLUCOSE 258* 312*  BUN 16 14  CREATININE 0.87 0.74  CALCIUM 7.7* 8.1*   Liver Function Tests  Recent Labs  05/10/16 0323  AST 20  ALT 16  ALKPHOS 175*  BILITOT 1.7*  PROT 5.0*  ALBUMIN 2.3*   No results for input(s): LIPASE, AMYLASE in the last 72 hours. Cardiac Enzymes  Recent Labs  05/10/16 0323 05/10/16 0826 05/10/16 1446  TROPONINI 0.78* 0.69* 0.73*   BNP Invalid input(s): POCBNP D-Dimer No results for input(s): DDIMER in the last 72 hours. Hemoglobin A1C No results for input(s): HGBA1C in the last 72 hours. Fasting Lipid Panel No results for input(s): CHOL, HDL, LDLCALC, TRIG, CHOLHDL, LDLDIRECT in the last 72 hours. Thyroid Function Tests No results for input(s): TSH, T4TOTAL, T3FREE, THYROIDAB in the last 72 hours.  Invalid input(s): FREET3  Telemetry    NSR with PVCs - Personally Reviewed  ECG    NA - Personally Reviewed  Radiology    Dg Chest 2 View  Result Date: 05/09/2016 CLINICAL DATA:  Dyspnea and chest pain  today. EXAM: CHEST  2 VIEW COMPARISON:  04/15/2016 FINDINGS: Diffuse interstitial prominence, worsened. This is particularly evident in the basilar periphery. There is patchy airspace opacity in the right upper lobe inferiorly. No pleural effusions. Hilar and mediastinal contours are unremarkable and unchanged. Baseline emphysematous disease appears severe, upper lobe predominant. IMPRESSION: The findings most likely represent congestive heart failure with interstitial edema and a mild degree of asymmetric alveolar edema in the right upper lobe, superimposed on severe emphysematous disease. Cannot exclude infectious infiltrate. Electronically Signed   By: Andreas Newport M.D.   On: 05/09/2016 21:42   Ct Angio Chest Pe W And/or Wo Contrast  Result Date: 05/10/2016 CLINICAL DATA:  Chest pain and shortness of breath tonight. History of COPD, diabetes, head and neck cancer. EXAM: CT ANGIOGRAPHY CHEST WITH CONTRAST TECHNIQUE: Multidetector CT imaging of the chest was performed using the standard protocol during bolus administration of intravenous contrast. Multiplanar CT image reconstructions and MIPs were obtained to evaluate the vascular anatomy. CONTRAST:  80 cc Isovue 370 COMPARISON:  Chest radiograph May 09, 2016 at 2109 hours and CT chest November 17, 2015 and CT chest May 29, 2015 FINDINGS: CARDIOVASCULAR: Adequate contrast opacification of the pulmonary artery's. Main pulmonary artery is not enlarged. No pulmonary arterial filling defects to the level of the subsegmental branches. Heart size is normal, RIGHT heart strain (RV/ LV 1.3) attributable to patient's emphysema and pneumonia. Severe coronary artery calcifications. No pericardial effusions. Thoracic aorta is normal course and caliber, moderate calcific atherosclerosis of the aortic arch. Probable stenosis LEFT subclavian artery origin, not tailored for evaluation. MEDIASTINUM/NODES: No lymphadenopathy by CT size criteria. Borderline RIGHT hilar  lymphadenopathy is likely reactive. LUNGS/PLEURA: Debris and RIGHT lower lobe bronchus with distal bronchi patchy partially occlusive debris. No pneumothorax. Moderate to severe centrilobular emphysema. Patchy consolidation in the periphery of the RIGHT middle lobe, RIGHT lower lobe. Stable 5 mm RIGHT upper lobe pulmonary nodule. 18 mm spiculated nodular density in LEFT lower lobe, lateral to site of prior 7 mm nodule which are stable. Diffuse interlobular septal thickening. UPPER ABDOMEN: Included view of the abdomen is unremarkable. MUSCULOSKELETAL: Visualized soft tissues and included osseous structures are nonacute. Old anterior rib fractures. Single lumen LEFT humeral head and manubrium. Osteopenia. Review of the MIP images confirms the above findings. IMPRESSION: No acute pulmonary embolism. Multifocal RIGHT greater than LEFT pneumonia, with debris  in RIGHT lower lobe bronchus most consistent with aspiration pneumonia. Please note, LEFT lower lobe consolidations is spiculated and, neoplasm not excluded. Recommend 3 month follow-up CT chest to verify improvement. Stable 5 mm RIGHT upper lobe, 7 mm low LEFT lower lobe pulmonary nodules in the setting of moderate to severe emphysema. Electronically Signed   By: Elon Alas M.D.   On: 05/10/2016 00:06   Dg Abd 2 Views  Result Date: 05/11/2016 CLINICAL DATA:  Abdominal pain. Patient reports no bowel movement for 2 weeks. EXAM: ABDOMEN - 2 VIEW COMPARISON:  None. FINDINGS: Large volume of stool throughout the entire colon. No definite rectal distention. No bowel dilatation to suggest obstruction. No abnormal gastric distension, small gastric air-fluid level. Scattered oblong radiopaque densities throughout the abdomen are consistent with ingested pills. No free air. Atherosclerosis and vascular stents are noted. There is excreted intravenous contrast within the urinary bladder from chest CT performed 2 days prior. Blunting of both costophrenic angles, with  mild progression. Bones are under mineralized. IMPRESSION: Large volume of colonic stool consistent with constipation. Multiple ingested pills scattered throughout the abdomen. No bowel obstruction. Electronically Signed   By: Jeb Levering M.D.   On: 05/11/2016 02:08    Cardiac Studies   NA  Patient Profile     63 yof with past medical history of HLD, DM, COPD on nighttime 3L O2, long-standing tobacco abuse, CAD, PAD s/p bil femoropopliteal 1999/2000 with revision of right femoropopliteal in 2014, TIA/CVA, and history of carotid artery disease  Last cath was 10/06/15 with pLAD t mLAD stenosis, 60% instent stenosis of prior LAD stent and 80% distal to stent stenosis.  Pt ha PTCA and stent placed, one to cover all three areas.  Also 90% Rt PDA stensosis with POBA alone.  Previous stents to RCA were patent along with previous LCX stents.   Presented to ED with chest pain.   Assessment & Plan    UNSTABLE ANGINA:  She is much improved.  Troponin trend has been flat.  Likely no plans for further in patient testing.    PNA/SEPSIS:  Per primary team.    Signed, Minus Breeding, MD  05/11/2016, 10:07 AM

## 2016-05-11 NOTE — Progress Notes (Signed)
[place] for Heparin Indication: chest pain/ACS  Allergies  Allergen Reactions  . Tape Hives and Other (See Comments)    USE PAPER TAPE ONLY- Adhesive peels off skin-makes pt. Raw.  . Isosorbide Other (See Comments)    Drops BP too low  . Nicoderm [Nicotine] Rash    To the PATCH only, breakouts on skin    Patient Measurements: Height: '5\' 5"'$  (165.1 cm) Weight: 129 lb 8 oz (58.7 kg) IBW/kg (Calculated) : 57  Vital Signs: Temp: 98.2 F (36.8 C) (12/09 0315) Temp Source: Oral (12/09 0315) BP: 128/60 (12/09 0315) Pulse Rate: 89 (12/09 0315)  Labs:  Recent Labs  05/09/16 2018 05/10/16 0323 05/10/16 0826 05/10/16 1446 05/10/16 1629 05/11/16 0313  HGB 12.0 10.5*  --   --   --  10.1*  HCT 35.9* 31.9*  --   --   --  30.5*  PLT 270 210  --   --   --  177  HEPARINUNFRC  --   --   --   --  0.36 0.18*  CREATININE 1.10* 0.87  --   --   --   --   TROPONINI  --  0.78* 0.69* 0.73*  --   --     Estimated Creatinine Clearance: 58 mL/min (by C-G formula based on SCr of 0.87 mg/dL).   Assessment: 65 y.o. female with chest pain for heparin   Goal of Therapy:  Heparin level 0.3-0.7 units/ml Monitor platelets by anticoagulation protocol: Yes   Plan:  Increase Heparin 850 units/hr Check heparin level in 8 hours.    05/11/2016 4:20 AM

## 2016-05-12 LAB — BASIC METABOLIC PANEL
ANION GAP: 6 (ref 5–15)
BUN: 12 mg/dL (ref 6–20)
CALCIUM: 8.3 mg/dL — AB (ref 8.9–10.3)
CHLORIDE: 99 mmol/L — AB (ref 101–111)
CO2: 25 mmol/L (ref 22–32)
Creatinine, Ser: 0.81 mg/dL (ref 0.44–1.00)
GFR calc Af Amer: >60 mL/min (ref >60)
GFR calc non Af Amer: >60 mL/min (ref >60)
GLUCOSE: 285 mg/dL — AB (ref 65–99)
Potassium: 4.6 mmol/L (ref 3.5–5.1)
Sodium: 130 mmol/L — ABNORMAL LOW (ref 135–145)

## 2016-05-12 LAB — GLUCOSE, CAPILLARY
GLUCOSE-CAPILLARY: 208 mg/dL — AB (ref 65–99)
GLUCOSE-CAPILLARY: 221 mg/dL — AB (ref 65–99)
GLUCOSE-CAPILLARY: 237 mg/dL — AB (ref 65–99)
Glucose-Capillary: 165 mg/dL — ABNORMAL HIGH (ref 65–99)

## 2016-05-12 LAB — CBC
HEMATOCRIT: 28.8 % — AB (ref 36.0–46.0)
HEMOGLOBIN: 9.6 g/dL — AB (ref 12.0–15.0)
MCH: 29.9 pg (ref 26.0–34.0)
MCHC: 33.3 g/dL (ref 30.0–36.0)
MCV: 89.7 fL (ref 78.0–100.0)
Platelets: 183 10*3/uL (ref 150–400)
RBC: 3.21 MIL/uL — ABNORMAL LOW (ref 3.87–5.11)
RDW: 15.4 % (ref 11.5–15.5)
WBC: 7 10*3/uL (ref 4.0–10.5)

## 2016-05-12 LAB — PROCALCITONIN: Procalcitonin: 0.29 ng/mL

## 2016-05-12 LAB — HEPARIN LEVEL (UNFRACTIONATED): Heparin Unfractionated: 0.25 IU/mL — ABNORMAL LOW (ref 0.30–0.70)

## 2016-05-12 MED ORDER — AMOXICILLIN-POT CLAVULANATE 875-125 MG PO TABS
1.0000 | ORAL_TABLET | Freq: Two times a day (BID) | ORAL | Status: DC
  Administered 2016-05-12 (×2): 1 via ORAL
  Filled 2016-05-12 (×3): qty 1

## 2016-05-12 MED ORDER — INSULIN ASPART 100 UNIT/ML ~~LOC~~ SOLN
4.0000 [IU] | Freq: Three times a day (TID) | SUBCUTANEOUS | Status: DC
  Administered 2016-05-13 – 2016-05-14 (×4): 4 [IU] via SUBCUTANEOUS

## 2016-05-12 MED ORDER — INSULIN GLARGINE 100 UNIT/ML ~~LOC~~ SOLN
12.0000 [IU] | Freq: Every day | SUBCUTANEOUS | Status: DC
  Administered 2016-05-12 – 2016-05-13 (×2): 12 [IU] via SUBCUTANEOUS
  Filled 2016-05-12 (×4): qty 0.12

## 2016-05-12 MED ORDER — SODIUM CHLORIDE 0.9 % IV SOLN
INTRAVENOUS | Status: DC
  Filled 2016-05-12: qty 1000

## 2016-05-12 MED ORDER — SODIUM CHLORIDE 0.9 % IV SOLN
INTRAVENOUS | Status: AC
Start: 2016-05-12 — End: 2016-05-13
  Administered 2016-05-12: 13:00:00 via INTRAVENOUS

## 2016-05-12 NOTE — Progress Notes (Signed)
ANTICOAGULATION CONSULT NOTE   Pharmacy Consult for Heparin Indication: chest pain/ACS    Assessment: 65 y.o. female with chest pain continues on heparin Heparin level = 0.25  Goal of Therapy:  Heparin level 0.3-0.7 units/ml Monitor platelets by anticoagulation protocol: Yes   Plan:  Increase Heparin 1350 units/hr 6 hour heparin level AM heparin level, CBC       Allergies  Allergen Reactions  . Tape Hives and Other (See Comments)    USE PAPER TAPE ONLY- Adhesive peels off skin-makes pt. Raw.  . Isosorbide Other (See Comments)    Drops BP too low  . Nicoderm [Nicotine] Rash    To the PATCH only, breakouts on skin    Patient Measurements: Height: '5\' 5"'$  (165.1 cm) Weight: 129 lb 8 oz (58.7 kg) IBW/kg (Calculated) : 57  Vital Signs: Temp: 97.9 F (36.6 C) (12/10 1000) Temp Source: Oral (12/10 1000) BP: 146/78 (12/10 0400) Pulse Rate: 89 (12/10 0400)  Labs:  Recent Labs  05/10/16 0323 05/10/16 0826 05/10/16 1446  05/11/16 0313 05/11/16 1228 05/11/16 2032 05/12/16 0137  HGB 10.5*  --   --   --  10.1*  --   --  9.6*  HCT 31.9*  --   --   --  30.5*  --   --  28.8*  PLT 210  --   --   --  177  --   --  183  HEPARINUNFRC  --   --   --   < > 0.18* <0.10* 0.22* 0.25*  CREATININE 0.87  --   --   --  0.74  --   --  0.81  TROPONINI 0.78* 0.69* 0.73*  --   --   --   --   --   < > = values in this interval not displayed.  Estimated Creatinine Clearance: 62.3 mL/min (by C-G formula based on SCr of 0.81 mg/dL).   Thank you Anette Guarneri, PharmD [phone]  05/12/2016 10:51 AM

## 2016-05-12 NOTE — Progress Notes (Signed)
PROGRESS NOTE  Brenda [name] GBT:[id number] DOB: 1951-01-29 DOA: 05/09/2016 PCP: [name], MD Brief History: 65 y.o.femalewith a past medical history significant for COPD on nightly O2, CAD s/p PCI, Tongue cancer s/p rad tx c/b dysphagia and recurrent aspiration pneumonia, and PVDwho presents with chest pain and sob x 1 day.The patient described her chest discomfort is dull and stabbing in sensation substernal in nature without any dizziness, nausea, diaphoresis. She was tired so she stayed in bed all day, then in the afternoon was sitting at the kitchen table when she had onset of central dull stabbing chest pain, radiating to the back. The patient took nitroglycerin spray without much relief. This gradually worsened, and didn't improve with nitro or aspirin, so the family brought her to the ER. CT angiogram chest at the time of admission negative for pulmonary embolus but showed multifocal opacities. More specifically, there was right lower lobe patchy occlusive debris, RML consolidation, and LLL spiculated opacity.WBC was 20.4 with lactic acid 0.85. The patient had hypotension which improved with fluid resuscitation. The patient was started on vancomycin and cefepime. She continues to have substernal chest discomfort on the morning of 05/10/2016. Heparin drip was started, and cardiology was consulted.  Assessment/Plan: Sepsis -Secondary to aspiration pneumonia -Continue intravenous antibotics -d/c vanco and cefepime -continue zosyn-->Augmentin -Sepsis physiology improved   Aspiration pneumonia -The patient has chronic dysphagia secondary to her tongue cancer with history of XRT -Speech therapy reevaluation -continue zosyn-->Augmentin  Unstable angina/Elevated troponin with established CAD -05/11/2016--chest pain improved -Continue heparin drip -appreciate cardiology -10/06/15 cath--Severe disease upstream and downstream from the LAD stent with moderate  in-stent restenosis in the LAD. These lesions were covered in tandem with a single DES stent. Also severe ostial to proximal RPDA stenosis treated with PTCA/POBA (plain old balloon angioplasty) only   Acute on chronic respiratory failure with hypoxia -Secondary aspiration pneumonia in the setting of COPD -Presently stable on 2 L nasal cannula -At baseline, patient is on 2 L at night only  COPD exacerbation -Continue prednisone -Continue bronchodilators.  -stable on 2 L  Diabetes mellitus type 2,insulin-dependent -CBG is elevated secondary to steroids -Increased Lantus to 10 units -continue sliding scale insulin  Hyponatremia -likely poor solute intake -IVF x 24 hours  Hyperlipidemia  -Continue Crestor   LLL spiculated nodular density -Repeat CT chest in 3 months  Disposition Plan: Home in 1-2 days when cleared by cardiology Family Communication: NoFamily at bedside  Consultants: cardiology  Code Status: FULL  DVT Prophylaxis: IVHeparin   Procedures: As Listed in Progress Note Above  Antibiotics: vanco 12/8 Cefepime 12/8 Zosyn 12/8>>>12/10 Amox/clav 12/10>>>     Subjective: Patient complains of intermittent chest discomfort with movement. Overall she is breathing better but still has some dyspnea on exertion. Denies any nausea, vomiting, diarrhea, abdominal pain. No dysuria or hematuria. No rashes.  Objective: Vitals:   05/12/16 0400 05/12/16 0823 05/12/16 0825 05/12/16 1000  BP: (!) 146/78     Pulse: 89     Resp: 13     Temp: 98.1 F (36.7 C)   97.9 F (36.6 C)  TempSrc: Oral   Oral  SpO2: 99% 99% 98%   Weight:      Height:        Intake/Output Summary (Last 24 hours) at 05/12/16 1056 Last data filed at 05/12/16 0600  Gross per 24 hour  Intake           398.87 ml  Output                0 ml  Net           398.87 ml   Weight change:  Exam:   General:  Pt is alert, follows commands appropriately, not in acute  distress  HEENT: No icterus, No thrush, No neck mass, [place]/AT  Cardiovascular: RRR, S1/S2, no rubs, no gallops  Respiratory: Right greater than left basilar crackles. No wheezing. Good air movement. Diminished breath sounds bilateral.  Abdomen: Soft/+BS, non tender, non distended, no guarding  Extremities: No edema, No lymphangitis, No petechiae, No rashes, no synovitis   Data Reviewed: I have personally reviewed following labs and imaging studies Basic Metabolic Panel:  Recent Labs Lab 05/09/16 2018 05/10/16 0323 05/11/16 0313 05/12/16 0137  NA 126* 131* 132* 130*  K 4.1 4.1 4.5 4.6  CL 91* 99* 101 99*  CO2 '24 24 25 25  '$ GLUCOSE 274* 258* 312* 285*  BUN '17 16 14 12  '$ CREATININE 1.10* 0.87 0.74 0.81  CALCIUM 8.9 7.7* 8.1* 8.3*   Liver Function Tests:  Recent Labs Lab 05/10/16 0323  AST 20  ALT 16  ALKPHOS 175*  BILITOT 1.7*  PROT 5.0*  ALBUMIN 2.3*   No results for input(s): LIPASE, AMYLASE in the last 168 hours. No results for input(s): AMMONIA in the last 168 hours. Coagulation Profile: No results for input(s): INR, PROTIME in the last 168 hours. CBC:  Recent Labs Lab 05/09/16 2018 05/10/16 0323 05/11/16 0313 05/12/16 0137  WBC 20.4* 14.1* 7.3 7.0  HGB 12.0 10.5* 10.1* 9.6*  HCT 35.9* 31.9* 30.5* 28.8*  MCV 90.9 90.4 90.2 89.7  PLT 270 210 177 183   Cardiac Enzymes:  Recent Labs Lab 05/10/16 0323 05/10/16 0826 05/10/16 1446  TROPONINI 0.78* 0.69* 0.73*   BNP: Invalid input(s): POCBNP CBG:  Recent Labs Lab 05/11/16 0913 05/11/16 1256 05/11/16 1753 05/11/16 1957 05/12/16 0732  GLUCAP 210* 272* 306* 307* 165*   HbA1C: No results for input(s): HGBA1C in the last 72 hours. Urine analysis:    Component Value Date/Time   COLORURINE AMBER (A) 05/28/2015 1529   APPEARANCEUR CLOUDY (A) 05/28/2015 1529   LABSPEC 1.021 05/28/2015 1529   PHURINE 5.5 05/28/2015 1529   GLUCOSEU >1000 (A) 05/28/2015 1529   HGBUR NEGATIVE 05/28/2015 1529    BILIRUBINUR SMALL (A) 05/28/2015 1529   KETONESUR NEGATIVE 05/28/2015 1529   PROTEINUR 30 (A) 05/28/2015 1529   UROBILINOGEN 1.0 01/17/2015 1627   NITRITE NEGATIVE 05/28/2015 1529   LEUKOCYTESUR NEGATIVE 05/28/2015 1529   Sepsis Labs: '@LABRCNTIP'$ (procalcitonin:4,lacticidven:4) ) Recent Results (from the past 240 hour(s))  MRSA PCR Screening     Status: None   Collection Time: 05/10/16  2:55 AM  Result Value Ref Range Status   MRSA by PCR NEGATIVE NEGATIVE Final    Comment:        The GeneXpert MRSA Assay (FDA approved for NASAL specimens only), is one component of a comprehensive MRSA colonization surveillance program. It is not intended to diagnose MRSA infection nor to guide or monitor treatment for MRSA infections.      Scheduled Meds: . aspirin EC  81 mg Oral Daily  . buPROPion  300 mg Oral Daily  . clopidogrel  75 mg Oral Daily  . feeding supplement (GLUCERNA SHAKE)  237 mL Oral BID BM  . fluticasone furoate-vilanterol  1 puff Inhalation Daily  . guaiFENesin  1,200 mg Oral BID  . insulin aspart  0-5 Units Subcutaneous  QHS  . insulin aspart  0-9 Units Subcutaneous TID WC  . insulin glargine  10 Units Subcutaneous QHS  . ipratropium-albuterol  3 mL Nebulization TID  . mouth rinse  15 mL Mouth Rinse BID  . pantoprazole  40 mg Oral Daily  . piperacillin-tazobactam (ZOSYN)  IV  3.375 g Intravenous Q8H  . polyethylene glycol  17 g Oral BID  . predniSONE  40 mg Oral Q breakfast  . rosuvastatin  20 mg Oral Daily  . sodium chloride flush  3 mL Intravenous Q12H   Continuous Infusions: . heparin 1,200 Units/hr (05/11/16 2116)    Procedures/Studies: Dg Chest 2 View  Result Date: 05/09/2016 CLINICAL DATA:  Dyspnea and chest pain today. EXAM: CHEST  2 VIEW COMPARISON:  04/15/2016 FINDINGS: Diffuse interstitial prominence, worsened. This is particularly evident in the basilar periphery. There is patchy airspace opacity in the right upper lobe inferiorly. No pleural  effusions. Hilar and mediastinal contours are unremarkable and unchanged. Baseline emphysematous disease appears severe, upper lobe predominant. IMPRESSION: The findings most likely represent congestive heart failure with interstitial edema and a mild degree of asymmetric alveolar edema in the right upper lobe, superimposed on severe emphysematous disease. Cannot exclude infectious infiltrate. Electronically Signed   By: Andreas Newport M.D.   On: 05/09/2016 21:42   Dg Chest 2 View  Result Date: 04/15/2016 CLINICAL DATA:  COPD exacerbation EXAM: CHEST  2 VIEW COMPARISON:  Chest radiograph 04/08/2016 FINDINGS: There is improved aeration of the right lung base. There are persistent diffusely increased pulmonary markings and hyperinflation of the lungs. Mild aortic arch atherosclerosis. Cardiomediastinal contours are otherwise normal. No new area of consolidation. No evidence of pulmonary edema. No pleural effusion or pneumothorax. IMPRESSION: 1. Improved aeration of the right lung base. No focal airspace consolidation. 2. COPD. 3. Aortic atherosclerosis. Electronically Signed   By: Ulyses Jarred M.D.   On: 04/15/2016 16:53   Dg Lumbar Spine Complete  Result Date: 05/01/2016 CLINICAL DATA:  Low back pain over the last 4 days. EXAM: LUMBAR SPINE - COMPLETE 4+ VIEW COMPARISON:  None. FINDINGS: Five lumbar type vertebral bodies show normal alignment. There is a superior endplate fracture at E93 with loss of height of 20%. This is newly seen since a CT of 02/19/2016. No disc space narrowing. Mild lower lumbar facet osteoarthritis. Large amount a gas and fecal matter throughout the colon. Multiple renal calculi on the right. Probable single renal calculus on the left. Aortic atherosclerosis. IMPRESSION: Superior endplate fracture at Y10, likely acute or subacute. No retropulsed bone. Large amount of gas and fecal matter throughout the colon. Aortic atherosclerosis. Renal stone disease. Electronically Signed    By: Nelson Chimes M.D.   On: 05/01/2016 14:16   Ct Angio Chest Pe W And/or Wo Contrast  Result Date: 05/10/2016 CLINICAL DATA:  Chest pain and shortness of breath tonight. History of COPD, diabetes, head and neck cancer. EXAM: CT ANGIOGRAPHY CHEST WITH CONTRAST TECHNIQUE: Multidetector CT imaging of the chest was performed using the standard protocol during bolus administration of intravenous contrast. Multiplanar CT image reconstructions and MIPs were obtained to evaluate the vascular anatomy. CONTRAST:  80 cc Isovue 370 COMPARISON:  Chest radiograph May 09, 2016 at 2109 hours and CT chest November 17, 2015 and CT chest May 29, 2015 FINDINGS: CARDIOVASCULAR: Adequate contrast opacification of the pulmonary artery's. Main pulmonary artery is not enlarged. No pulmonary arterial filling defects to the level of the subsegmental branches. Heart size is normal, RIGHT heart strain (RV/ LV  1.3) attributable to patient's emphysema and pneumonia. Severe coronary artery calcifications. No pericardial effusions. Thoracic aorta is normal course and caliber, moderate calcific atherosclerosis of the aortic arch. Probable stenosis LEFT subclavian artery origin, not tailored for evaluation. MEDIASTINUM/NODES: No lymphadenopathy by CT size criteria. Borderline RIGHT hilar lymphadenopathy is likely reactive. LUNGS/PLEURA: Debris and RIGHT lower lobe bronchus with distal bronchi patchy partially occlusive debris. No pneumothorax. Moderate to severe centrilobular emphysema. Patchy consolidation in the periphery of the RIGHT middle lobe, RIGHT lower lobe. Stable 5 mm RIGHT upper lobe pulmonary nodule. 18 mm spiculated nodular density in LEFT lower lobe, lateral to site of prior 7 mm nodule which are stable. Diffuse interlobular septal thickening. UPPER ABDOMEN: Included view of the abdomen is unremarkable. MUSCULOSKELETAL: Visualized soft tissues and included osseous structures are nonacute. Old anterior rib fractures. Single  lumen LEFT humeral head and manubrium. Osteopenia. Review of the MIP images confirms the above findings. IMPRESSION: No acute pulmonary embolism. Multifocal RIGHT greater than LEFT pneumonia, with debris in RIGHT lower lobe bronchus most consistent with aspiration pneumonia. Please note, LEFT lower lobe consolidations is spiculated and, neoplasm not excluded. Recommend 3 month follow-up CT chest to verify improvement. Stable 5 mm RIGHT upper lobe, 7 mm low LEFT lower lobe pulmonary nodules in the setting of moderate to severe emphysema. Electronically Signed   By: Elon Alas M.D.   On: 05/10/2016 00:06   Dg Abd 2 Views  Result Date: 05/11/2016 CLINICAL DATA:  Abdominal pain. Patient reports no bowel movement for 2 weeks. EXAM: ABDOMEN - 2 VIEW COMPARISON:  None. FINDINGS: Large volume of stool throughout the entire colon. No definite rectal distention. No bowel dilatation to suggest obstruction. No abnormal gastric distension, small gastric air-fluid level. Scattered oblong radiopaque densities throughout the abdomen are consistent with ingested pills. No free air. Atherosclerosis and vascular stents are noted. There is excreted intravenous contrast within the urinary bladder from chest CT performed 2 days prior. Blunting of both costophrenic angles, with mild progression. Bones are under mineralized. IMPRESSION: Large volume of colonic stool consistent with constipation. Multiple ingested pills scattered throughout the abdomen. No bowel obstruction. Electronically Signed   By: Jeb Levering M.D.   On: 05/11/2016 02:08    Kawon Willcutt, DO  [facility] Pager [phone]  If 7PM-7AM, please contact night-coverage [email or web address] Password [email or web address] 05/12/2016, 10:56 AM   LOS: 2 days

## 2016-05-12 NOTE — Progress Notes (Signed)
Patient Name: Brenda [name] Date of Encounter: 05/12/2016  Primary Cardiologist:   Dr. Beckie Salts Problem List     Principal Problem:   Sepsis, unspecified organism [facility]) Active Problems:   DM (diabetes mellitus), type 2, uncontrolled ([place])   CAD: Stents in p-mRCA, p & m Cx, p-mLAD   CAP (community acquired pneumonia)   COPD exacerbation ([place])   Dysphagia with aspiration   Acute on chronic respiratory failure with hypoxia ([name])   Protein-calorie malnutrition, moderate ([name])   Hypotension   Sepsis ([place])   Lobar pneumonia ([place])   Aspiration pneumonia ([place])     Subjective   Chest pain improved.  Breathing improved.   Perhaps back to baseline.  She did have some pleuritic type pain last night.  She is bothered by the bed and back pain.    Inpatient Medications    Scheduled Meds: . aspirin EC  81 mg Oral Daily  . buPROPion  300 mg Oral Daily  . clopidogrel  75 mg Oral Daily  . feeding supplement (GLUCERNA SHAKE)  237 mL Oral BID BM  . fluticasone furoate-vilanterol  1 puff Inhalation Daily  . guaiFENesin  1,200 mg Oral BID  . insulin aspart  0-5 Units Subcutaneous QHS  . insulin aspart  0-9 Units Subcutaneous TID WC  . insulin glargine  10 Units Subcutaneous QHS  . ipratropium-albuterol  3 mL Nebulization TID  . mouth rinse  15 mL Mouth Rinse BID  . pantoprazole  40 mg Oral Daily  . piperacillin-tazobactam (ZOSYN)  IV  3.375 g Intravenous Q8H  . polyethylene glycol  17 g Oral BID  . predniSONE  40 mg Oral Q breakfast  . rosuvastatin  20 mg Oral Daily  . sodium chloride flush  3 mL Intravenous Q12H   Continuous Infusions: . heparin 1,200 Units/hr (05/11/16 2116)   PRN Meds: acetaminophen **OR** acetaminophen, albuterol, cyclobenzaprine, ondansetron **OR** ondansetron (ZOFRAN) IV, traMADol   Vital Signs    Vitals:   05/12/16 0400 05/12/16 0823 05/12/16 0825 05/12/16 1000  BP: (!) 146/78     Pulse: 89     Resp: 13     Temp: 98.1 F (36.7 C)   97.9  F (36.6 C)  TempSrc: Oral   Oral  SpO2: 99% 99% 98%   Weight:      Height:        Intake/Output Summary (Last 24 hours) at 05/12/16 1057 Last data filed at 05/12/16 0600  Gross per 24 hour  Intake           398.87 ml  Output                0 ml  Net           398.87 ml   Filed Weights   05/10/16 0303  Weight: 129 lb 8 oz (58.7 kg)    Physical Exam    GEN: NAD.  Neck:   No JVD Cardiac:  Regular Rate and Rhythm, no murmurs, rubs, or gallops.  No edema.  Radials/DP/PT 2+  and equal bilaterally.  Respiratory:  Respirations decreased breath sounds bilaterally.  GI: Soft, nontender, nondistended, BS + x 4. Skin: warm and dry, no rash. Neuro:   Strength and sensation are intact. Psych:  AAOx3.  Normal affect.  Labs    CBC  Recent Labs  05/11/16 0313 05/12/16 0137  WBC 7.3 7.0  HGB 10.1* 9.6*  HCT 30.5* 28.8*  MCV 90.2 89.7  PLT 177 183  Basic Metabolic Panel  Recent Labs  05/11/16 0313 05/12/16 0137  NA 132* 130*  K 4.5 4.6  CL 101 99*  CO2 25 25  GLUCOSE 312* 285*  BUN 14 12  CREATININE 0.74 0.81  CALCIUM 8.1* 8.3*   Liver Function Tests  Recent Labs  05/10/16 0323  AST 20  ALT 16  ALKPHOS 175*  BILITOT 1.7*  PROT 5.0*  ALBUMIN 2.3*   No results for input(s): LIPASE, AMYLASE in the last 72 hours. Cardiac Enzymes  Recent Labs  05/10/16 0323 05/10/16 0826 05/10/16 1446  TROPONINI 0.78* 0.69* 0.73*   BNP Invalid input(s): POCBNP D-Dimer No results for input(s): DDIMER in the last 72 hours. Hemoglobin A1C No results for input(s): HGBA1C in the last 72 hours. Fasting Lipid Panel No results for input(s): CHOL, HDL, LDLCALC, TRIG, CHOLHDL, LDLDIRECT in the last 72 hours. Thyroid Function Tests No results for input(s): TSH, T4TOTAL, T3FREE, THYROIDAB in the last 72 hours.  Invalid input(s): FREET3  Telemetry    NSR with PVCs - Personally Reviewed  ECG    NA    Radiology    Dg Abd 2 Views  Result Date: 05/11/2016 CLINICAL  DATA:  Abdominal pain. Patient reports no bowel movement for 2 weeks. EXAM: ABDOMEN - 2 VIEW COMPARISON:  None. FINDINGS: Large volume of stool throughout the entire colon. No definite rectal distention. No bowel dilatation to suggest obstruction. No abnormal gastric distension, small gastric air-fluid level. Scattered oblong radiopaque densities throughout the abdomen are consistent with ingested pills. No free air. Atherosclerosis and vascular stents are noted. There is excreted intravenous contrast within the urinary bladder from chest CT performed 2 days prior. Blunting of both costophrenic angles, with mild progression. Bones are under mineralized. IMPRESSION: Large volume of colonic stool consistent with constipation. Multiple ingested pills scattered throughout the abdomen. No bowel obstruction. Electronically Signed   By: Jeb Levering M.D.   On: 05/11/2016 02:08    Cardiac Studies   NA  Patient Profile     41 yof with past medical history of HLD, DM, COPD on nighttime 3L O2, long-standing tobacco abuse, CAD, PAD s/p bil femoropopliteal 1999/2000 with revision of right femoropopliteal in 2014, TIA/CVA, and history of carotid artery disease  Last cath was 10/06/15 with pLAD t mLAD stenosis, 60% instent stenosis of prior LAD stent and 80% distal to stent stenosis.  Pt ha PTCA and stent placed, one to cover all three areas.  Also 90% Rt PDA stensosis with POBA alone.  Previous stents to RCA were patent along with previous LCX stents.   Presented to ED with chest pain.   Assessment & Plan    CHEST PAIN:  She is not having pain consistent with angina. Troponin trend has been flat.    Can stop heparin drip.   I would suggest TOC follow up in our clinic next week to discuss timing of out patient Lexiscan Myoview.  She is not able to take Imdur.  Continue current PO meds.   PNA/SEPSIS:  Per primary team.    Signed, Minus Breeding, MD  05/12/2016, 10:57 AM

## 2016-05-13 ENCOUNTER — Telehealth: Payer: Self-pay | Admitting: Cardiology

## 2016-05-13 ENCOUNTER — Inpatient Hospital Stay (HOSPITAL_COMMUNITY): Payer: Medicare Other

## 2016-05-13 DIAGNOSIS — I950 Idiopathic hypotension: Secondary | ICD-10-CM

## 2016-05-13 LAB — BASIC METABOLIC PANEL
Anion gap: 8 (ref 5–15)
BUN: 7 mg/dL (ref 6–20)
CALCIUM: 8.7 mg/dL — AB (ref 8.9–10.3)
CO2: 29 mmol/L (ref 22–32)
CREATININE: 0.69 mg/dL (ref 0.44–1.00)
Chloride: 96 mmol/L — ABNORMAL LOW (ref 101–111)
Glucose, Bld: 179 mg/dL — ABNORMAL HIGH (ref 65–99)
Potassium: 4.2 mmol/L (ref 3.5–5.1)
SODIUM: 133 mmol/L — AB (ref 135–145)

## 2016-05-13 LAB — GLUCOSE, CAPILLARY
GLUCOSE-CAPILLARY: 132 mg/dL — AB (ref 65–99)
GLUCOSE-CAPILLARY: 161 mg/dL — AB (ref 65–99)
Glucose-Capillary: 230 mg/dL — ABNORMAL HIGH (ref 65–99)
Glucose-Capillary: 235 mg/dL — ABNORMAL HIGH (ref 65–99)

## 2016-05-13 LAB — LEGIONELLA PNEUMOPHILA SEROGP 1 UR AG: L. PNEUMOPHILA SEROGP 1 UR AG: NEGATIVE

## 2016-05-13 MED ORDER — BISOPROLOL FUMARATE 5 MG PO TABS
2.5000 mg | ORAL_TABLET | Freq: Every day | ORAL | Status: DC
Start: 2016-05-13 — End: 2016-05-13

## 2016-05-13 MED ORDER — AMOXICILLIN-POT CLAVULANATE 400-57 MG/5ML PO SUSR
875.0000 mg | Freq: Two times a day (BID) | ORAL | Status: DC
  Administered 2016-05-13 – 2016-05-14 (×3): 875 mg via ORAL
  Filled 2016-05-13: qty 10.9
  Filled 2016-05-13: qty 17.5
  Filled 2016-05-13: qty 10.9

## 2016-05-13 MED ORDER — BISOPROLOL FUMARATE 5 MG PO TABS
2.5000 mg | ORAL_TABLET | Freq: Every day | ORAL | Status: DC
  Administered 2016-05-13 – 2016-05-14 (×2): 2.5 mg via ORAL
  Filled 2016-05-13 (×2): qty 1

## 2016-05-13 MED ORDER — RESOURCE THICKENUP CLEAR PO POWD
ORAL | Status: DC | PRN
  Filled 2016-05-13: qty 125

## 2016-05-13 NOTE — Progress Notes (Signed)
PROGRESS NOTE  Brenda [name] VQQ:[id number] DOB: Oct 07, 1950 DOA: 05/09/2016 PCP: [name], MD  Brief/Interim Summary: 65 y.o.femalewith a past medical history significant for COPD on nightly O2, CAD s/p PCI, Tongue cancer s/p rad tx c/b dysphagia and recurrent aspiration pneumonia, and PVDwho presents with chest pain and sob x 1 day.The patient described her chest discomfort is dull and stabbing in sensation substernal in nature without any dizziness, nausea, diaphoresis. She was tired so she stayed in bed all day, then in the afternoon was sitting at the kitchen table when she had onset of central dull stabbing chest pain, radiating to the back. The patient took nitroglycerin spray without much relief. This gradually worsened, and didn't improve with nitro or aspirin, so the family brought her to the ER. CT angiogram chest at the time of admission negative for pulmonary embolus but showed multifocal opacities. More specifically, there was right lower lobe patchy occlusive debris, RML consolidation, and LLL spiculated opacity.WBC was 20.4 with lactic acid 0.85. The patient had hypotension which improved with fluid resuscitation. The patient was started on vancomycin and cefepime. She continues to have substernal chest discomfort on the morning of 05/10/2016. Heparin drip was started, and cardiology was consulted.  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 These lesions were covered in tandem with a single DES stent. Also severe ostial to proximal RPDA stenosis treated with PTCA/POBA (plain old balloon angioplasty) only  -continue ASA and plavix -12/11--bisoprolol added by cardiology  Acute on chronic respiratory failure with hypoxia -Secondary aspiration pneumonia in the setting of COPD -Presently stable on 2 L nasal cannula -At baseline, patient is on 2 L at night only  COPD exacerbation -Continue prednisone D#4 of 5 -Continue bronchodilators.  -stable on 2 L -continue Breo  Dysphagia -speech therapy-->continue Dys 3 diet with nectar thickened lquids; recommended barium esophagram to r/o stricture -05/13/16--barium esophagram--no stricture, moderate esophageal dysmotility -change amox/clav to liquid formulation  Diabetes mellitus type 2,insulin-dependent -CBG is elevated secondary to steroids -IncreasedLantus to 12 units -continue sliding scale insulin -12/11 HbA1C-pending  Hyponatremia -likely poor solute intake -IV NS x 24 hours-->improved  Hyperlipidemia  -Continue Crestor   LLLspiculated nodular density -Repeat CT chest in 3 months  Disposition Plan: Home 05/14/16  when cleared by cardiology Family Communication: NoFamily at bedside  Consultants: cardiology  Code Status: FULL  DVT Prophylaxis: IVHeparin   Procedures: As Listed in Progress Note Above  Antibiotics: vanco 12/8 Cefepime 12/8 Zosyn 12/8>>>12/10 Amox/clav 12/10>>>  Subjective: Patient continues to feel intermittent dysphagia type symptoms, but says that this is normal for her. She states her chest pain is much improved. Denies any nausea, vomiting, diarrhea, abdominal pain. She feels that her breathing is  improved.  Objective: Vitals:   05/13/16 0713 05/13/16 0853 05/13/16 1324 05/13/16 1331  BP:  (!) 122/58 118/70   Pulse:  92 73   Resp:  18 18   Temp:  97.6  F (36.4 C) 97.9 F (36.6 C)   TempSrc:  Oral Oral   SpO2: 99% 97% 100% 98%  Weight:      Height:        Intake/Output Summary (Last 24 hours) at 05/13/16 1439 Last data filed at 05/13/16 1017  Gross per 24 hour  Intake              120 ml  Output             1950 ml  Net            -1830 ml   Weight change:  Exam:   General:  Pt is alert, follows commands appropriately, not in acute distress  HEENT: No icterus, No thrush, No neck mass, [place]/AT  Cardiovascular: RRR, S1/S2, no rubs, no gallops  Respiratory: Right greater than left basilar rales. No wheezing. Good air movement.  Abdomen: Soft/+BS, non tender, non distended, no guarding  Extremities: trace LE edema, No lymphangitis, No petechiae, No rashes, no synovitis   Data Reviewed: I have personally reviewed following labs and imaging studies Basic Metabolic Panel:  Recent Labs Lab 05/09/16 2018 05/10/16 0323 05/11/16 0313 05/12/16 0137 05/13/16 0844  NA 126* 131* 132* 130* 133*  K 4.1 4.1 4.5 4.6 4.2  CL 91* 99* 101 99* 96*  CO2 '24 24 25 25 29  '$ GLUCOSE 274* 258* 312* 285* 179*  BUN '17 16 14 12 7  '$ CREATININE 1.10* 0.87 0.74 0.81 0.69  CALCIUM 8.9 7.7* 8.1* 8.3* 8.7*   Liver Function Tests:  Recent Labs Lab 05/10/16 0323  AST 20  ALT 16  ALKPHOS 175*  BILITOT 1.7*  PROT 5.0*  ALBUMIN 2.3*   No results for input(s): LIPASE, AMYLASE in the last 168 hours. No results for input(s): AMMONIA in the last 168 hours. Coagulation Profile: No results for input(s): INR, PROTIME in the last 168 hours. CBC:  Recent Labs Lab 05/09/16 2018 05/10/16 0323 05/11/16 0313 05/12/16 0137  WBC 20.4* 14.1* 7.3 7.0  HGB 12.0 10.5* 10.1* 9.6*  HCT 35.9* 31.9* 30.5* 28.8*  MCV 90.9 90.4 90.2 89.7  PLT 270 210 177 183   Cardiac Enzymes:  Recent  Labs Lab 05/10/16 0323 05/10/16 0826 05/10/16 1446  TROPONINI 0.78* 0.69* 0.73*   BNP: Invalid input(s): POCBNP CBG:  Recent Labs Lab 05/12/16 1241 05/12/16 1625 05/12/16 2105 05/13/16 0804 05/13/16 1224  GLUCAP 208* 221* 237* 132* 161*   HbA1C: No results for input(s): HGBA1C in the last 72 hours. Urine analysis:    Component Value Date/Time   COLORURINE AMBER (A) 05/28/2015 1529   APPEARANCEUR CLOUDY (A) 05/28/2015 1529   LABSPEC 1.021 05/28/2015 1529   PHURINE 5.5 05/28/2015 1529   GLUCOSEU >1000 (A) 05/28/2015 1529   HGBUR NEGATIVE 05/28/2015 1529   BILIRUBINUR SMALL (A) 05/28/2015 1529   KETONESUR NEGATIVE 05/28/2015 1529   PROTEINUR 30 (A) 05/28/2015 1529   UROBILINOGEN 1.0 01/17/2015 1627   NITRITE NEGATIVE 05/28/2015 1529   LEUKOCYTESUR NEGATIVE 05/28/2015 1529   Sepsis Labs: '@LABRCNTIP'$ (procalcitonin:4,lacticidven:4) ) Recent Results (from the past 240 hour(s))  MRSA PCR Screening     Status: None   Collection Time: 05/10/16  2:55 AM  Result Value Ref Range Status   MRSA by PCR NEGATIVE NEGATIVE Final    Comment:        The GeneXpert MRSA Assay (FDA approved for NASAL specimens only), is one component of a comprehensive MRSA colonization surveillance program. It is not intended to diagnose MRSA infection nor  to guide or monitor treatment for MRSA infections.      Scheduled Meds: . amoxicillin-clavulanate  875 mg Oral Q12H  . aspirin EC  81 mg Oral Daily  . bisoprolol  2.5 mg Oral Daily  . buPROPion  300 mg Oral Daily  . clopidogrel  75 mg Oral Daily  . feeding supplement (GLUCERNA SHAKE)  237 mL Oral BID BM  . fluticasone furoate-vilanterol  1 puff Inhalation Daily  . guaiFENesin  1,200 mg Oral BID  . insulin aspart  0-5 Units Subcutaneous QHS  . insulin aspart  0-9 Units Subcutaneous TID WC  . insulin aspart  4 Units Subcutaneous TID WC  . insulin glargine  12 Units Subcutaneous QHS  . ipratropium-albuterol  3 mL Nebulization TID  .  mouth rinse  15 mL Mouth Rinse BID  . pantoprazole  40 mg Oral Daily  . polyethylene glycol  17 g Oral BID  . predniSONE  40 mg Oral Q breakfast  . rosuvastatin  20 mg Oral Daily  . sodium chloride flush  3 mL Intravenous Q12H   Continuous Infusions:  Procedures/Studies: Dg Chest 2 View  Result Date: 05/09/2016 CLINICAL DATA:  Dyspnea and chest pain today. EXAM: CHEST  2 VIEW COMPARISON:  04/15/2016 FINDINGS: Diffuse interstitial prominence, worsened. This is particularly evident in the basilar periphery. There is patchy airspace opacity in the right upper lobe inferiorly. No pleural effusions. Hilar and mediastinal contours are unremarkable and unchanged. Baseline emphysematous disease appears severe, upper lobe predominant. IMPRESSION: The findings most likely represent congestive heart failure with interstitial edema and a mild degree of asymmetric alveolar edema in the right upper lobe, superimposed on severe emphysematous disease. Cannot exclude infectious infiltrate. Electronically Signed   By: Andreas Newport M.D.   On: 05/09/2016 21:42   Dg Chest 2 View  Result Date: 04/15/2016 CLINICAL DATA:  COPD exacerbation EXAM: CHEST  2 VIEW COMPARISON:  Chest radiograph 04/08/2016 FINDINGS: There is improved aeration of the right lung base. There are persistent diffusely increased pulmonary markings and hyperinflation of the lungs. Mild aortic arch atherosclerosis. Cardiomediastinal contours are otherwise normal. No new area of consolidation. No evidence of pulmonary edema. No pleural effusion or pneumothorax. IMPRESSION: 1. Improved aeration of the right lung base. No focal airspace consolidation. 2. COPD. 3. Aortic atherosclerosis. Electronically Signed   By: Ulyses Jarred M.D.   On: 04/15/2016 16:53   Dg Lumbar Spine Complete  Result Date: 05/01/2016 CLINICAL DATA:  Low back pain over the last 4 days. EXAM: LUMBAR SPINE - COMPLETE 4+ VIEW COMPARISON:  None. FINDINGS: Five lumbar type  vertebral bodies show normal alignment. There is a superior endplate fracture at Z66 with loss of height of 20%. This is newly seen since a CT of 02/19/2016. No disc space narrowing. Mild lower lumbar facet osteoarthritis. Large amount a gas and fecal matter throughout the colon. Multiple renal calculi on the right. Probable single renal calculus on the left. Aortic atherosclerosis. IMPRESSION: Superior endplate fracture at Q94, likely acute or subacute. No retropulsed bone. Large amount of gas and fecal matter throughout the colon. Aortic atherosclerosis. Renal stone disease. Electronically Signed   By: Nelson Chimes M.D.   On: 05/01/2016 14:16   Ct Angio Chest Pe W And/or Wo Contrast  Result Date: 05/10/2016 CLINICAL DATA:  Chest pain and shortness of breath tonight. History of COPD, diabetes, head and neck cancer. EXAM: CT ANGIOGRAPHY CHEST WITH CONTRAST TECHNIQUE: Multidetector CT imaging of the chest was performed using the standard protocol  during bolus administration of intravenous contrast. Multiplanar CT image reconstructions and MIPs were obtained to evaluate the vascular anatomy. CONTRAST:  80 cc Isovue 370 COMPARISON:  Chest radiograph May 09, 2016 at 2109 hours and CT chest November 17, 2015 and CT chest May 29, 2015 FINDINGS: CARDIOVASCULAR: Adequate contrast opacification of the pulmonary artery's. Main pulmonary artery is not enlarged. No pulmonary arterial filling defects to the level of the subsegmental branches. Heart size is normal, RIGHT heart strain (RV/ LV 1.3) attributable to patient's emphysema and pneumonia. Severe coronary artery calcifications. No pericardial effusions. Thoracic aorta is normal course and caliber, moderate calcific atherosclerosis of the aortic arch. Probable stenosis LEFT subclavian artery origin, not tailored for evaluation. MEDIASTINUM/NODES: No lymphadenopathy by CT size criteria. Borderline RIGHT hilar lymphadenopathy is likely reactive. LUNGS/PLEURA: Debris  and RIGHT lower lobe bronchus with distal bronchi patchy partially occlusive debris. No pneumothorax. Moderate to severe centrilobular emphysema. Patchy consolidation in the periphery of the RIGHT middle lobe, RIGHT lower lobe. Stable 5 mm RIGHT upper lobe pulmonary nodule. 18 mm spiculated nodular density in LEFT lower lobe, lateral to site of prior 7 mm nodule which are stable. Diffuse interlobular septal thickening. UPPER ABDOMEN: Included view of the abdomen is unremarkable. MUSCULOSKELETAL: Visualized soft tissues and included osseous structures are nonacute. Old anterior rib fractures. Single lumen LEFT humeral head and manubrium. Osteopenia. Review of the MIP images confirms the above findings. IMPRESSION: No acute pulmonary embolism. Multifocal RIGHT greater than LEFT pneumonia, with debris in RIGHT lower lobe bronchus most consistent with aspiration pneumonia. Please note, LEFT lower lobe consolidations is spiculated and, neoplasm not excluded. Recommend 3 month follow-up CT chest to verify improvement. Stable 5 mm RIGHT upper lobe, 7 mm low LEFT lower lobe pulmonary nodules in the setting of moderate to severe emphysema. Electronically Signed   By: Elon Alas M.D.   On: 05/10/2016 00:06   Dg Esophagus  Result Date: 05/13/2016 CLINICAL DATA:  Esophageal stricture. Worsening dysphasia. Projectile vomiting. EXAM: ESOPHOGRAM/BARIUM SWALLOW TECHNIQUE: Single contrast examination was performed using thick and thin barium. FLUOROSCOPY TIME:  Fluoroscopy Time:  2 minutes 12 seconds Radiation Exposure Index (if provided by the fluoroscopic device): Number of Acquired Spot Images: 0 COMPARISON:  None. FINDINGS: Double contrast evaluation esophagus is limited, as the patient was felt a likely experience difficulty swallowing the effervescent crystals. No gross mucosal abnormality identified. Evaluation of peristalsis demonstrates incomplete primary peristaltic wave with significant contrast stasis  throughout the thoracic esophagus. In the RAO orientation single contrast evaluation demonstrates no persistent narrowing or stricture. A 13 mm barium tablet has delayed passage in the mid and lower esophagus. This delayed persistent even with subsequent barium swallows. IMPRESSION: 1. Degraded exam secondary to patient immobility and difficulty with multiple swallows. 2. Moderate esophageal dysmotility, likely presbyesophagus. 3. No definite evidence for esophageal mass or stricture. 4. Delayed passage of a 13 mm barium tablet in the mid and lower esophagus, favor to be related to dysmotility. Electronically Signed   By: Kerby Moors M.D.   On: 05/13/2016 13:51   Dg Abd 2 Views  Result Date: 05/11/2016 CLINICAL DATA:  Abdominal pain. Patient reports no bowel movement for 2 weeks. EXAM: ABDOMEN - 2 VIEW COMPARISON:  None. FINDINGS: Large volume of stool throughout the entire colon. No definite rectal distention. No bowel dilatation to suggest obstruction. No abnormal gastric distension, small gastric air-fluid level. Scattered oblong radiopaque densities throughout the abdomen are consistent with ingested pills. No free air. Atherosclerosis and vascular stents are noted.  There is excreted intravenous contrast within the urinary bladder from chest CT performed 2 days prior. Blunting of both costophrenic angles, with mild progression. Bones are under mineralized. IMPRESSION: Large volume of colonic stool consistent with constipation. Multiple ingested pills scattered throughout the abdomen. No bowel obstruction. Electronically Signed   By: Jeb Levering M.D.   On: 05/11/2016 02:08    Cordell Guercio, DO  [facility] Pager [phone]  If 7PM-7AM, please contact night-coverage [email or web address] Password [email or web address] 05/13/2016, 2:39 PM   LOS: 3 days

## 2016-05-13 NOTE — Telephone Encounter (Signed)
7-10 day TCM on Dec 20th with Dr.Harding '@10'$ :15

## 2016-05-13 NOTE — Evaluation (Signed)
Clinical/Bedside Swallow Evaluation Patient Details  Name: Brenda [name] MRN: [id number] Date of Birth: [birth year]  Today's Date: 05/13/2016 Time: SLP Start Time (ACUTE ONLY): 26 SLP Stop Time (ACUTE ONLY): 1045 SLP Time Calculation (min) (ACUTE ONLY): 25 min  Past Medical History:  Past Medical History:  Diagnosis Date  . Arthritis    "hands" (11/26/2012)  . CAD S/P percutaneous coronary angioplasty 1999; 2001;2003, 10/2014   a) BMS- mCx; b) re-do PCI for prog of Dz (NIR BMS 3.5 x 12); c) staged PCI: p-mRCA Express II BMS 3.0x24 & 3.5 x 16 --> p-mLAD Cyper DES 3.0 x 13 & PTCA of D2 (2.5 balloon); 10/2015 PCI p-m LAD Promus DES 2.75 x 38 (covers pre&post-stent ISR), POBA of ostial rPDA  . Cancer of base of tongue ([place]) 2010   "& lymph nodes @ right neck" (11/26/2012  . Carotid artery occlusion   . Chronic bronchitis ([place])   . COPD (chronic obstructive pulmonary disease) with emphysema ([place])   . Depression   . GERD (gastroesophageal reflux disease)   . H/O hiatal hernia   . History of blood transfusion   . History of DVT (deep vein thrombosis)   . Hx of radiation therapy   . Hx of radiation therapy 01/16/09 - 03/06/09   base of tongue, right neck node  . Hypercholesteremia   . Myocardial infarction 1999, 2003  . PAD (peripheral artery disease) ([name])    status post bilateral femoropopliteal bypass grafting --> Dr. Sherren Mocha Early; -  02/2013 Revision of R Fem-AKPop to BK Pop.  . Patent foramen ovale February 2013   Small. Small PFO noted on TTE for TIA/CVA R-L + Bubble study with valsalva  . Pneumonia   . Stroke [facility])    left side weakness remains (11/26/2012)  . Transient ischemic attack (TIA) February 2013  . Type II diabetes mellitus ([place])    Past Surgical History:  Past Surgical History:  Procedure Laterality Date  . ANKLE FRACTURE SURGERY Right 2007   "got a metal plate and 8 screws in it" (11/26/2012)  . APPENDECTOMY  1962  . BYPASS GRAFT POPLITEAL TO POPLITEAL Right 02/05/2013    Procedure: BYPASS GRAFT ABOVE KNEE POPLITEAL TO BELOW KNEE POPLITEAL WITH SMALL SAPHANOUS VEIN;  Surgeon: Rosetta Posner, MD;  Location: [place];  Service: Vascular;  Laterality: Right;  . CARDIAC CATHETERIZATION  1999; 10/2005   5/'07: mildLAD 20-30%, patent stent, diags- no Dz. RCA 2 BMS patent ~20% ISR.   Marland Kitchen CARDIAC CATHETERIZATION N/A 10/06/2015   Procedure: Left Heart Cath and Coronary Angiography;  Surgeon: Leonie Man, MD;  Location: [place] [facility];  Service: Cardiovascular; p-mLAD 99% pre-stent, 60% ISR & 80% post-stent, o-RPDA 90%, ~10% ISR in 2 RCA (Prox & mid) BMS,~20% ISR in pCx BMS with 5% OM2 BMS.    Marland Kitchen CARDIAC CATHETERIZATION N/A 10/06/2015   Procedure: Coronary Stent Intervention;  Surgeon: Leonie Man, MD;  Location: [place] [facility];  Service: Cardiovascular; p-m LAD Promus DES 2.75 x 38 (tapered post-dilatoin)  . CARDIAC CATHETERIZATION N/A 10/06/2015   Procedure: Coronary Balloon Angioplasty;  Surgeon: Leonie Man, MD;  Location: [place] [facility];  Service: Cardiovascular;  POBA of [place] rPDA.  Marland Kitchen CARPAL TUNNEL RELEASE Right 1980's?  Marland Kitchen CATARACT EXTRACTION W/ INTRAOCULAR LENS IMPLANT Right 2011  . [place]  . CORONARY ANGIOPLASTY WITH STENT PLACEMENT  1999--2005   1999 - OM2 BMS; 2001: p-mCx NIR BMS 3.5 x 12; 10/2001: p-mRCA Express II  BMS - 3.5 x 16 - 3.0 x 24.   . ESOPHAGOGASTRODUODENOSCOPY  12/20/2011   Procedure: ESOPHAGOGASTRODUODENOSCOPY (EGD);  Surgeon: Beryle Beams, MD;  Location: [facility];  Service: Endoscopy;  Laterality: N/A;  EGD with balloon dilation  . EYE SURGERY    . FEMORAL BYPASS  1999; 2000   Bilateral Fem-AK POP bypass.   Marland Kitchen PERIPHERAL VASCULAR CATHETERIZATION N/A 12/01/2015   Procedure: Abdominal Aortogram w/Lower Extremity;  Surgeon: Elam Dutch, MD;  Location: [place] [facility];  Service: Cardiovascular;  Laterality: N/A;  . PERIPHERAL VASCULAR CATHETERIZATION Right 12/22/2015   Procedure: Peripheral Vascular Intervention;   Surgeon: Rosetta Posner, MD;  Location: [place] [facility];  Service: Cardiovascular;  Laterality: Right;  EXternal iliac  . SAVORY DILATION  12/20/2011   Procedure: SAVORY DILATION;  Surgeon: Beryle Beams, MD;  Location: [facility];  Service: Endoscopy;  Laterality: N/A;  . TEE WITHOUT CARDIOVERSION  07/26/2011   Procedure: TRANSESOPHAGEAL ECHOCARDIOGRAM (TEE);  Surgeon: Pixie Casino, MD;  Location: [facility];  Service: Cardiovascular;  Laterality: N/A;  . TONSILLECTOMY  1969  . TRANSTHORACIC ECHOCARDIOGRAM  March 2016   Normal LV size and function EF 55-60%. No regional wall motion and amount is. No significant valvular lesions.  . TUBAL LIGATION  1988  . VAGINAL HYSTERECTOMY  1988   HPI:  65 y.o. female with a past medical history significant for COPD on nightly O2, CAD s/p PCI, Tongue cancer s/p rad tx c/b dysphagia and recurrent aspiration pneumonia, and PVD who presents with chest pain and sob x 1 day. Cardic work up stable, chest imaging revealed right lower lobe PNA.     Assessment / Plan / Recommendation Clinical Impression  Bedside swallow evaluation complete. Patient with likely multiple factors that have contributed to her PNA.  Dysphagia; previous MBS results discussed with patient, who reported to being compliant with nectar-thick liquids up until around Thanksgiving when she just decided to stop.  Patient observed to demonstrate no overt s/s of aspiration with cup sips of thin liquids.  SLP reinforced and explained patient's history of silent aspiration and rationale for nectar-thick liquids with PO, oral care and water between meals.  Trials of Dys.1 textures and nectar-thick liquids via cup revealed continued decreased hyolaryngeal elevation and multiple swallows per bolus.  Advanced texture trials not attempted at this visit due to burping, reports of feeling full and patient feeling like she might regurgitate again.  Patient with reports of intermittent regurgitation  recently and per chart review history of stricture in 2013, which could be second factor contributing to PNA.  Educated patient on downgraded textures as potentially being easier to swallow; however, patient declined. Discussed recommendations for downgrade to nectar-thick liquids and consideration for DG esophagus with RN and MD.  SLP to follow for diet tolerance and education.       Aspiration Risk  Moderate aspiration risk    Diet Recommendation Dysphagia 3 (Mech soft);Nectar-thick liquid   Liquid Administration via: Cup;No straw Medication Administration: Whole meds with puree Supervision: Patient able to self feed;Intermittent supervision to cue for compensatory strategies Compensations: Slow rate;Small sips/bites;Multiple dry swallows after each bite/sip Postural Changes: Seated upright at 90 degrees    Other  Recommendations Recommended Consults: Consider esophageal assessment;Other (Comment) (DG Esophagus ) Oral Care Recommendations: Oral care prior to ice chip/H20;Oral care BID Other Recommendations: Order thickener from pharmacy;Remove water pitcher;Have oral suction available;Prohibited food (jello, ice cream, thin soups)   Follow up Recommendations Home health SLP  Frequency and Duration min 2x/week  1 week       Prognosis Prognosis for Safe Diet Advancement: Fair Barriers to Reach Goals: Severity of deficits      Swallow Study   General HPI: 65 y.o. female with a past medical history significant for COPD on nightly O2, CAD s/p PCI, Tongue cancer s/p rad tx c/b dysphagia and recurrent aspiration pneumonia, and PVD who presents with chest pain and sob x 1 day. Cardic work up stable, chest imaging revealed right lower lobe PNA.   Type of Study: Bedside Swallow Evaluation Previous Swallow Assessment: MBS 07/10/15 recommended Dys.3 and nectar-thick liquids; MBS 05/29/15 recommended Dys.3 and honey-thick liquids  Diet Prior to this Study: Dysphagia 3 (soft);Thin  liquids Temperature Spikes Noted: No Respiratory Status: Room air History of Recent Intubation: No Behavior/Cognition: Alert;Cooperative;Pleasant mood Oral Cavity Assessment: Within Functional Limits Oral Cavity - Dentition: Dentures, top;Dentures, bottom Vision: Functional for self-feeding Self-Feeding Abilities: Able to feed self Patient Positioning: Upright in bed Baseline Vocal Quality: Normal Volitional Cough: Strong Volitional Swallow: Able to elicit    Oral/Motor/Sensory Function Overall Oral Motor/Sensory Function: Mild impairment   Ice Chips Ice chips: Not tested   Thin Liquid Thin Liquid: Impaired Presentation: Self Fed Pharyngeal  Phase Impairments: Decreased hyoid-laryngeal movement;Multiple swallows Other Comments: no overt s/s but history of silent penetration and aspiration of thin liquids     Nectar Thick Nectar Thick Liquid: Impaired Presentation: Cup;Self Fed Pharyngeal Phase Impairments: Decreased hyoid-laryngeal movement;Multiple swallows   Honey Thick Honey Thick Liquid: Not tested   Puree Puree: Impaired Presentation: Self Fed;Spoon Pharyngeal Phase Impairments: Decreased hyoid-laryngeal movement;Multiple swallows   Solid   GO   Solid: Not tested Other Comments: pt reported feeling full like she might regurgitate again        Gunnar Fusi, M.A., CCC-SLP [phone]  Brenda [name] 05/13/2016,2:05 PM

## 2016-05-13 NOTE — Progress Notes (Signed)
SATURATION QUALIFICATIONS: (This note is used to comply with regulatory documentation for home oxygen)  Patient Saturations on Room Air at Rest = 95%  Patient Saturations on Room Air while Ambulating = 79%  Patient Saturations on 2 Liters of oxygen while Ambulating = 96%  Please briefly explain why patient needs home oxygen:

## 2016-05-13 NOTE — Progress Notes (Signed)
Physical Therapy Evaluation Patient Details Name: Brenda [name] MRN: [id number] DOB: 1950/10/18 Today's Date: 05/13/2016   History of Present Illness   Brenda [name] is a 65 y.o. female with a past medical history significant for COPD on nightly O2, CAD s/p PCI, Tongue cancer s/p rad tx c/b dysphagia and recurrent aspiration pneumonia, and PVD who presents with chest pain.  Found to have aspiration PNA.  Clinical Impression  Pt admitted with/for CP due to aspiration pbneumonia.  Pt currently limited functionally due to the problems listed below.  (see problems list.)  Pt will benefit from PT to maximize function and safety to be able to get home safely with available assist of family.     Follow Up Recommendations Home health PT    Equipment Recommendations  None recommended by PT    Recommendations for Other Services       Precautions / Restrictions Precautions Precautions: Fall      Mobility  Bed Mobility Overal bed mobility: Needs Assistance Bed Mobility: Rolling;Sidelying to Sit;Sit to Supine Rolling: Min guard Sidelying to sit: Min guard   Sit to supine: Min guard   General bed mobility comments: Discussed rolling to come OOB via UE to decrease stress on her back  Transfers Overall transfer level: Needs assistance Equipment used: None (iv pole or ) Transfers: Sit to/from Stand Sit to Stand: Min guard            Ambulation/Gait Ambulation/Gait assistance: Min assist;Min guard Ambulation Distance (Feet): 190 Feet Assistive device: None (iv pole) Gait Pattern/deviations: Step-through pattern   Gait velocity interpretation: at or above normal speed for age/gender General Gait Details: general steady almost undetectable paretic gait with unequal step length  Stairs            Wheelchair Mobility    Modified Rankin (Stroke Patients Only)       Balance Overall balance assessment: Needs assistance Sitting-balance support: No upper extremity  supported Sitting balance-Leahy Scale: Good       Standing balance-Leahy Scale: Fair                               Pertinent Vitals/Pain Pain Assessment: Faces Faces Pain Scale: Hurts little more Pain Location: back Pain Descriptors / Indicators: Burning;Grimacing;Guarding Pain Intervention(s): Monitored during session;Repositioned    Home Living Family/patient expects to be discharged to:: Private residence Living Arrangements: Spouse/significant other;Children Available Help at Discharge: Family;Available PRN/intermittently (husband in/out days, g/child in school, daugter works nights) Type of Home: House Home Access: Stairs to enter Entrance Stairs-Rails: Right Entrance Stairs-Number of Steps: [place]: Two level;Able to live on main level with bedroom/bathroom Home Equipment: Bedside commode;Shower seat;Walker - 2 wheels;Cane - single point;Grab bars - tub/shower;Crutches;Other (comment);Wheelchair - manual Additional Comments: oxygen at night    Prior Function Level of Independence: Independent         Comments: uses cane occasionally when outside, 1 fall in past year in June '16 but reports bumping into walls frequently due to "dizzy spells"     Hand Dominance   Dominant Hand: Right    Extremity/Trunk Assessment   Upper Extremity Assessment: Defer to OT evaluation           Lower Extremity Assessment: Overall WFL for tasks assessed;Generalized weakness;LLE deficits/detail (general weakness bil proximal musculature and trunk)   LLE Deficits / Details: left generally weaker than R LE     Communication   Communication: No difficulties  Cognition Arousal/Alertness: Awake/alert Behavior During Therapy: Flat affect Overall Cognitive Status: Within Functional Limits for tasks assessed                      General Comments General comments (skin integrity, edema, etc.): SpO2 on RA during gait 89/90% and EHR 83 bpm,  2L O2 [place]  reapplied    Exercises     Assessment/Plan    PT Assessment Patient needs continued PT services  PT Problem List Decreased strength;Decreased activity tolerance;Decreased balance;Decreased mobility;Cardiopulmonary status limiting activity          PT Treatment Interventions Gait training;Stair training;Functional mobility training;Therapeutic activities;Balance training;Patient/family education    PT Goals (Current goals can be found in the Care Plan section)  Acute Rehab PT Goals Patient Stated Goal: able to get home and be alone when I need to. PT Goal Formulation: With patient Time For Goal Achievement: 05/20/16 Potential to Achieve Goals: Good    Frequency Min 3X/week   Barriers to discharge        Co-evaluation               End of Session   Activity Tolerance: Patient tolerated treatment well Patient left: in bed;with call bell/phone within reach (foing to a test.) Nurse Communication: Mobility status         Time: 3343-5686 PT Time Calculation (min) (ACUTE ONLY): 29 min   Charges:   PT Evaluation $PT Eval Moderate Complexity: 1 Procedure PT Treatments $Gait Training: 8-22 mins   PT G CodesTessie [name] Brenda [name] 05/13/2016, 1:01 PM  05/13/2016  Donnella Sham, PT [phone]  (pager)

## 2016-05-13 NOTE — Discharge Summary (Signed)
Physician Discharge Summary  Brenda [name] XQJ:[id number] DOB: 02/23/1951 DOA: 05/09/2016  PCP: [name], MD  Admit date: 05/09/2016 Discharge date: 05/14/16  Admitted From: Home Disposition:  Home  Recommendations for Outpatient Follow-up:  1. Follow up with PCP in 1-2 weeks 2. Please obtain BMP/CBC in one week   Home Health: Equipment/Devices: 2 L [place]  Discharge Condition:Stable CODE STATUS: FULL Diet recommendation: Dysphagia 3 with nectar thickened liquids  Brief/Interim Summary: 65 y.o.femalewith a past medical history significant for COPD on nightly O2, CAD s/p PCI, Tongue cancer s/p rad tx c/b dysphagia and recurrent aspiration pneumonia, and PVDwho presents with chest pain and sob x 1 day.The patient described her chest discomfort is dull and stabbing in sensation substernal in nature without any dizziness, nausea, diaphoresis. She was tired so she stayed in bed all day, then in the afternoon was sitting at the kitchen table when she had onset of central dull stabbing chest pain, radiating to the back. The patient took nitroglycerin spray without much relief. This gradually worsened, and didn't improve with nitro or aspirin, so the family brought her to the ER. CT angiogram chest at the time of admission negative for pulmonary embolus but showed multifocal opacities. More specifically, there was right lower lobe patchy occlusive debris, RML consolidation, and LLL spiculated opacity.WBC was 20.4 with lactic acid 0.85. The patient had hypotension which improved with fluid resuscitation. The patient was started on vancomycin and cefepime. She continues to have substernal chest discomfort on the morning of 05/10/2016. Heparin drip was started, and cardiology was consulted.  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 These lesions were covered in tandem with a single DES stent. Also severe ostial to proximal RPDA stenosis treated with PTCA/POBA (plain old balloon angioplasty) only  -continue ASA and plavix and bisoprolol  Acute on chronic respiratory failure with hypoxia -Secondary aspiration pneumonia in the setting of COPD -Presently stable on 2 L nasal cannula -At baseline, patient is on 2 L at night only -ambulatory pulse ox showed desaturation-->pt needs oxygen 2L 24/7  COPD exacerbation -Continue prednisone--finished 5 days prednisone -Continue bronchodilators.  -stable on 2 L -continue Breo  Diabetes mellitus type 2,insulin-dependent -CBG is elevated secondary to steroids -home with increased insulin dose-->10 units daily (previously on 5 units) -continue sliding scale insulin -05/13/16 HbA1C-9.0  Hyponatremia -likely poor solute intake -IV NS x 24 hours--improved  Hyperlipidemia  -Continue Crestor   LLLspiculated nodular density -Repeat CT chest in 3 months   Discharge Instructions  Discharge Instructions    Diet - low sodium heart healthy    Complete by:  As directed    Increase activity slowly    Complete by:  As directed        Medication List    TAKE these medications   acetaminophen 325 MG tablet Commonly known as:   TYLENOL Take 2 tablets (650 mg total) by  mouth every 6 (six) hours as needed.   albuterol 108 (90 Base) MCG/ACT inhaler Commonly known as:  PROVENTIL HFA;VENTOLIN HFA Inhale 2 puffs into the lungs every 6 (six) hours as needed for wheezing or shortness of breath.   amoxicillin-clavulanate 875-125 MG tablet Commonly known as:  AUGMENTIN Take 1 tablet by mouth 2 (two) times daily.   aspirin EC 81 MG tablet Take 1 tablet (81 mg total) by mouth daily.   bisoprolol 5 MG tablet Commonly known as:  ZEBETA Take 0.5 tablets (2.5 mg total) by mouth daily. Start taking on:  05/15/2016   buPROPion 300 MG 24 hr tablet Commonly known as:  WELLBUTRIN XL Take 300 mg by mouth daily.   clopidogrel 75 MG tablet Commonly known as:  PLAVIX Take 1 tablet (75 mg total) by mouth daily.   cyclobenzaprine 5 MG tablet Commonly known as:  FLEXERIL Take 1-2 tablets (5-10 mg total) by mouth 3 (three) times daily as needed for muscle spasms.   docusate sodium 100 MG capsule Commonly known as:  COLACE Take 100 mg by mouth daily as needed for mild constipation.   feeding supplement (GLUCERNA SHAKE) Liqd Take 237 mLs by mouth 2 (two) times daily between meals.   fish oil-omega-3 fatty acids 1000 MG capsule Take 1 g by mouth 2 (two) times daily.   fluticasone 50 MCG/ACT nasal spray Commonly known as:  FLONASE Place 2 sprays into both nostrils daily.   fluticasone furoate-vilanterol 100-25 MCG/INH Aepb Commonly known as:  BREO ELLIPTA Inhale 1 puff into the lungs daily.   guaiFENesin 600 MG 12 hr tablet Commonly known as:  MUCINEX Take 1,200 mg by mouth 2 (two) times daily.   insulin glargine 100 UNIT/ML injection Commonly known as:  LANTUS Inject 0.1 mLs (10 Units total) into the skin at bedtime. What changed:  how much to take   insulin lispro 100 UNIT/ML injection Commonly known as:  HUMALOG Inject 3 Units into the skin 3 (three) times daily with meals as needed for high blood sugar.    ipratropium-albuterol 0.5-2.5 (3) MG/3ML Soln Commonly known as:  DUONEB Take 3 mLs by nebulization every 6 (six) hours. What changed:  when to take this  reasons to take this   loratadine 10 MG tablet Commonly known as:  CLARITIN Take 1 tablet (10 mg total) by mouth daily.   MULTIVITAMIN & MINERAL PO Take 1 tablet by mouth daily.   neomycin-bacitracin-polymyxin ointment Commonly known as:  NEOSPORIN Apply 1 application topically as needed. For open wound   nitroGLYCERIN 0.4 MG/SPRAY spray Commonly known as:  NITROLINGUAL Place 1 spray under the tongue every 5 (five) minutes as needed for chest pain.   omeprazole 20 MG capsule Commonly known as:  PRILOSEC Take 20 mg by mouth daily.   polyethylene glycol powder powder Commonly known as:  GLYCOLAX/MIRALAX Take 17 g by mouth 2 (two) times daily.   RESOURCE THICKENUP CLEAR Powd Use with thin liquids to make nectar thickened consistency   rosuvastatin 20 MG tablet Commonly known as:  CRESTOR Take 20 mg by mouth daily.   SPIRIVA RESPIMAT 2.5 MCG/ACT Aers Generic drug:  Tiotropium Bromide Monohydrate 2 puffs daily.   traMADol 50 MG tablet Commonly known as:  ULTRAM Take 1 tablet (50 mg total) by mouth every 6 (six) hours as needed for moderate pain or severe pain.            Durable Medical Equipment        Start  Ordered   05/13/16 2040  For home use only DME oxygen  Once    Question Answer Comment  Mode or (Route) Nasal cannula   Liters per Minute 2   Frequency Continuous (stationary and portable oxygen unit needed)   Oxygen conserving device Yes   Oxygen delivery system Gas      05/13/16 2039     Follow-up Information    Kerin Ransom, PA-C Follow up on 05/21/2016.   Specialties:  Cardiology, Radiology Why:  at 9:30am for hospital follow up  Contact information: [address] [place] [address] [phone]          Allergies  Allergen Reactions  . Tape Hives and Other  (See Comments)    USE PAPER TAPE ONLY- Adhesive peels off skin-makes pt. Raw.  . Isosorbide Other (See Comments)    Drops BP too low  . Nicoderm [Nicotine] Rash    To the PATCH only, breakouts on skin    Consultations:  Cardiology   Procedures/Studies: Dg Chest 2 View  Result Date: 05/09/2016 CLINICAL DATA:  Dyspnea and chest pain today. EXAM: CHEST  2 VIEW COMPARISON:  04/15/2016 FINDINGS: Diffuse interstitial prominence, worsened. This is particularly evident in the basilar periphery. There is patchy airspace opacity in the right upper lobe inferiorly. No pleural effusions. Hilar and mediastinal contours are unremarkable and unchanged. Baseline emphysematous disease appears severe, upper lobe predominant. IMPRESSION: The findings most likely represent congestive heart failure with interstitial edema and a mild degree of asymmetric alveolar edema in the right upper lobe, superimposed on severe emphysematous disease. Cannot exclude infectious infiltrate. Electronically Signed   By: Andreas Newport M.D.   On: 05/09/2016 21:42   Dg Chest 2 View  Result Date: 04/15/2016 CLINICAL DATA:  COPD exacerbation EXAM: CHEST  2 VIEW COMPARISON:  Chest radiograph 04/08/2016 FINDINGS: There is improved aeration of the right lung base. There are persistent diffusely increased pulmonary markings and hyperinflation of the lungs. Mild aortic arch atherosclerosis. Cardiomediastinal contours are otherwise normal. No new area of consolidation. No evidence of pulmonary edema. No pleural effusion or pneumothorax. IMPRESSION: 1. Improved aeration of the right lung base. No focal airspace consolidation. 2. COPD. 3. Aortic atherosclerosis. Electronically Signed   By: Ulyses Jarred M.D.   On: 04/15/2016 16:53   Dg Lumbar Spine Complete  Result Date: 05/01/2016 CLINICAL DATA:  Low back pain over the last 4 days. EXAM: LUMBAR SPINE - COMPLETE 4+ VIEW COMPARISON:  None. FINDINGS: Five lumbar type vertebral bodies show  normal alignment. There is a superior endplate fracture at C58 with loss of height of 20%. This is newly seen since a CT of 02/19/2016. No disc space narrowing. Mild lower lumbar facet osteoarthritis. Large amount a gas and fecal matter throughout the colon. Multiple renal calculi on the right. Probable single renal calculus on the left. Aortic atherosclerosis. IMPRESSION: Superior endplate fracture at N27, likely acute or subacute. No retropulsed bone. Large amount of gas and fecal matter throughout the colon. Aortic atherosclerosis. Renal stone disease. Electronically Signed   By: Nelson Chimes M.D.   On: 05/01/2016 14:16   Ct Angio Chest Pe W And/or Wo Contrast  Result Date: 05/10/2016 CLINICAL DATA:  Chest pain and shortness of breath tonight. History of COPD, diabetes, head and neck cancer. EXAM: CT ANGIOGRAPHY CHEST WITH CONTRAST TECHNIQUE: Multidetector CT imaging of the chest was performed using the standard protocol during bolus administration of intravenous contrast. Multiplanar CT image reconstructions and MIPs were obtained to evaluate  the vascular anatomy. CONTRAST:  80 cc Isovue 370 COMPARISON:  Chest radiograph May 09, 2016 at 2109 hours and CT chest November 17, 2015 and CT chest May 29, 2015 FINDINGS: CARDIOVASCULAR: Adequate contrast opacification of the pulmonary artery's. Main pulmonary artery is not enlarged. No pulmonary arterial filling defects to the level of the subsegmental branches. Heart size is normal, RIGHT heart strain (RV/ LV 1.3) attributable to patient's emphysema and pneumonia. Severe coronary artery calcifications. No pericardial effusions. Thoracic aorta is normal course and caliber, moderate calcific atherosclerosis of the aortic arch. Probable stenosis LEFT subclavian artery origin, not tailored for evaluation. MEDIASTINUM/NODES: No lymphadenopathy by CT size criteria. Borderline RIGHT hilar lymphadenopathy is likely reactive. LUNGS/PLEURA: Debris and RIGHT lower lobe  bronchus with distal bronchi patchy partially occlusive debris. No pneumothorax. Moderate to severe centrilobular emphysema. Patchy consolidation in the periphery of the RIGHT middle lobe, RIGHT lower lobe. Stable 5 mm RIGHT upper lobe pulmonary nodule. 18 mm spiculated nodular density in LEFT lower lobe, lateral to site of prior 7 mm nodule which are stable. Diffuse interlobular septal thickening. UPPER ABDOMEN: Included view of the abdomen is unremarkable. MUSCULOSKELETAL: Visualized soft tissues and included osseous structures are nonacute. Old anterior rib fractures. Single lumen LEFT humeral head and manubrium. Osteopenia. Review of the MIP images confirms the above findings. IMPRESSION: No acute pulmonary embolism. Multifocal RIGHT greater than LEFT pneumonia, with debris in RIGHT lower lobe bronchus most consistent with aspiration pneumonia. Please note, LEFT lower lobe consolidations is spiculated and, neoplasm not excluded. Recommend 3 month follow-up CT chest to verify improvement. Stable 5 mm RIGHT upper lobe, 7 mm low LEFT lower lobe pulmonary nodules in the setting of moderate to severe emphysema. Electronically Signed   By: Elon Alas M.D.   On: 05/10/2016 00:06   Dg Esophagus  Result Date: 05/13/2016 CLINICAL DATA:  Esophageal stricture. Worsening dysphasia. Projectile vomiting. EXAM: ESOPHOGRAM/BARIUM SWALLOW TECHNIQUE: Single contrast examination was performed using thick and thin barium. FLUOROSCOPY TIME:  Fluoroscopy Time:  2 minutes 12 seconds Radiation Exposure Index (if provided by the fluoroscopic device): Number of Acquired Spot Images: 0 COMPARISON:  None. FINDINGS: Double contrast evaluation esophagus is limited, as the patient was felt a likely experience difficulty swallowing the effervescent crystals. No gross mucosal abnormality identified. Evaluation of peristalsis demonstrates incomplete primary peristaltic wave with significant contrast stasis throughout the thoracic  esophagus. In the RAO orientation single contrast evaluation demonstrates no persistent narrowing or stricture. A 13 mm barium tablet has delayed passage in the mid and lower esophagus. This delayed persistent even with subsequent barium swallows. IMPRESSION: 1. Degraded exam secondary to patient immobility and difficulty with multiple swallows. 2. Moderate esophageal dysmotility, likely presbyesophagus. 3. No definite evidence for esophageal mass or stricture. 4. Delayed passage of a 13 mm barium tablet in the mid and lower esophagus, favor to be related to dysmotility. Electronically Signed   By: Kerby Moors M.D.   On: 05/13/2016 13:51   Dg Abd 2 Views  Result Date: 05/11/2016 CLINICAL DATA:  Abdominal pain. Patient reports no bowel movement for 2 weeks. EXAM: ABDOMEN - 2 VIEW COMPARISON:  None. FINDINGS: Large volume of stool throughout the entire colon. No definite rectal distention. No bowel dilatation to suggest obstruction. No abnormal gastric distension, small gastric air-fluid level. Scattered oblong radiopaque densities throughout the abdomen are consistent with ingested pills. No free air. Atherosclerosis and vascular stents are noted. There is excreted intravenous contrast within the urinary bladder from chest CT performed 2 days prior.  Blunting of both costophrenic angles, with mild progression. Bones are under mineralized. IMPRESSION: Large volume of colonic stool consistent with constipation. Multiple ingested pills scattered throughout the abdomen. No bowel obstruction. Electronically Signed   By: Jeb Levering M.D.   On: 05/11/2016 02:08        Discharge Exam: Vitals:   05/13/16 2249 05/14/16 0910  BP: 135/73 (!) 142/64  Pulse: 97 86  Resp: 20 16  Temp: 97.8 F (36.6 C) 97.9 F (36.6 C)   Vitals:   05/13/16 2249 05/14/16 0850 05/14/16 0853 05/14/16 0910  BP: 135/73   (!) 142/64  Pulse: 97   86  Resp: 20   16  Temp: 97.8 F (36.6 C)   97.9 F (36.6 C)  TempSrc: Oral    Oral  SpO2: 92% 100% 100% 100%  Weight:      Height:        General: Pt is alert, awake, not in acute distress Cardiovascular: RRR, S1/S2 +, no rubs, no gallops Respiratory:bibasilar crackles, no wheezes Abdominal: Soft, NT, ND, bowel sounds + Extremities: trace LE edema, no cyanosis   The results of significant diagnostics from this hospitalization (including imaging, microbiology, ancillary and laboratory) are listed below for reference.    Significant Diagnostic Studies: Dg Chest 2 View  Result Date: 05/09/2016 CLINICAL DATA:  Dyspnea and chest pain today. EXAM: CHEST  2 VIEW COMPARISON:  04/15/2016 FINDINGS: Diffuse interstitial prominence, worsened. This is particularly evident in the basilar periphery. There is patchy airspace opacity in the right upper lobe inferiorly. No pleural effusions. Hilar and mediastinal contours are unremarkable and unchanged. Baseline emphysematous disease appears severe, upper lobe predominant. IMPRESSION: The findings most likely represent congestive heart failure with interstitial edema and a mild degree of asymmetric alveolar edema in the right upper lobe, superimposed on severe emphysematous disease. Cannot exclude infectious infiltrate. Electronically Signed   By: Andreas Newport M.D.   On: 05/09/2016 21:42   Dg Chest 2 View  Result Date: 04/15/2016 CLINICAL DATA:  COPD exacerbation EXAM: CHEST  2 VIEW COMPARISON:  Chest radiograph 04/08/2016 FINDINGS: There is improved aeration of the right lung base. There are persistent diffusely increased pulmonary markings and hyperinflation of the lungs. Mild aortic arch atherosclerosis. Cardiomediastinal contours are otherwise normal. No new area of consolidation. No evidence of pulmonary edema. No pleural effusion or pneumothorax. IMPRESSION: 1. Improved aeration of the right lung base. No focal airspace consolidation. 2. COPD. 3. Aortic atherosclerosis. Electronically Signed   By: Ulyses Jarred M.D.   On:  04/15/2016 16:53   Dg Lumbar Spine Complete  Result Date: 05/01/2016 CLINICAL DATA:  Low back pain over the last 4 days. EXAM: LUMBAR SPINE - COMPLETE 4+ VIEW COMPARISON:  None. FINDINGS: Five lumbar type vertebral bodies show normal alignment. There is a superior endplate fracture at S50 with loss of height of 20%. This is newly seen since a CT of 02/19/2016. No disc space narrowing. Mild lower lumbar facet osteoarthritis. Large amount a gas and fecal matter throughout the colon. Multiple renal calculi on the right. Probable single renal calculus on the left. Aortic atherosclerosis. IMPRESSION: Superior endplate fracture at N39, likely acute or subacute. No retropulsed bone. Large amount of gas and fecal matter throughout the colon. Aortic atherosclerosis. Renal stone disease. Electronically Signed   By: Nelson Chimes M.D.   On: 05/01/2016 14:16   Ct Angio Chest Pe W And/or Wo Contrast  Result Date: 05/10/2016 CLINICAL DATA:  Chest pain and shortness of breath tonight. History  of COPD, diabetes, head and neck cancer. EXAM: CT ANGIOGRAPHY CHEST WITH CONTRAST TECHNIQUE: Multidetector CT imaging of the chest was performed using the standard protocol during bolus administration of intravenous contrast. Multiplanar CT image reconstructions and MIPs were obtained to evaluate the vascular anatomy. CONTRAST:  80 cc Isovue 370 COMPARISON:  Chest radiograph May 09, 2016 at 2109 hours and CT chest November 17, 2015 and CT chest May 29, 2015 FINDINGS: CARDIOVASCULAR: Adequate contrast opacification of the pulmonary artery's. Main pulmonary artery is not enlarged. No pulmonary arterial filling defects to the level of the subsegmental branches. Heart size is normal, RIGHT heart strain (RV/ LV 1.3) attributable to patient's emphysema and pneumonia. Severe coronary artery calcifications. No pericardial effusions. Thoracic aorta is normal course and caliber, moderate calcific atherosclerosis of the aortic arch.  Probable stenosis LEFT subclavian artery origin, not tailored for evaluation. MEDIASTINUM/NODES: No lymphadenopathy by CT size criteria. Borderline RIGHT hilar lymphadenopathy is likely reactive. LUNGS/PLEURA: Debris and RIGHT lower lobe bronchus with distal bronchi patchy partially occlusive debris. No pneumothorax. Moderate to severe centrilobular emphysema. Patchy consolidation in the periphery of the RIGHT middle lobe, RIGHT lower lobe. Stable 5 mm RIGHT upper lobe pulmonary nodule. 18 mm spiculated nodular density in LEFT lower lobe, lateral to site of prior 7 mm nodule which are stable. Diffuse interlobular septal thickening. UPPER ABDOMEN: Included view of the abdomen is unremarkable. MUSCULOSKELETAL: Visualized soft tissues and included osseous structures are nonacute. Old anterior rib fractures. Single lumen LEFT humeral head and manubrium. Osteopenia. Review of the MIP images confirms the above findings. IMPRESSION: No acute pulmonary embolism. Multifocal RIGHT greater than LEFT pneumonia, with debris in RIGHT lower lobe bronchus most consistent with aspiration pneumonia. Please note, LEFT lower lobe consolidations is spiculated and, neoplasm not excluded. Recommend 3 month follow-up CT chest to verify improvement. Stable 5 mm RIGHT upper lobe, 7 mm low LEFT lower lobe pulmonary nodules in the setting of moderate to severe emphysema. Electronically Signed   By: Elon Alas M.D.   On: 05/10/2016 00:06   Dg Esophagus  Result Date: 05/13/2016 CLINICAL DATA:  Esophageal stricture. Worsening dysphasia. Projectile vomiting. EXAM: ESOPHOGRAM/BARIUM SWALLOW TECHNIQUE: Single contrast examination was performed using thick and thin barium. FLUOROSCOPY TIME:  Fluoroscopy Time:  2 minutes 12 seconds Radiation Exposure Index (if provided by the fluoroscopic device): Number of Acquired Spot Images: 0 COMPARISON:  None. FINDINGS: Double contrast evaluation esophagus is limited, as the patient was felt a likely  experience difficulty swallowing the effervescent crystals. No gross mucosal abnormality identified. Evaluation of peristalsis demonstrates incomplete primary peristaltic wave with significant contrast stasis throughout the thoracic esophagus. In the RAO orientation single contrast evaluation demonstrates no persistent narrowing or stricture. A 13 mm barium tablet has delayed passage in the mid and lower esophagus. This delayed persistent even with subsequent barium swallows. IMPRESSION: 1. Degraded exam secondary to patient immobility and difficulty with multiple swallows. 2. Moderate esophageal dysmotility, likely presbyesophagus. 3. No definite evidence for esophageal mass or stricture. 4. Delayed passage of a 13 mm barium tablet in the mid and lower esophagus, favor to be related to dysmotility. Electronically Signed   By: Kerby Moors M.D.   On: 05/13/2016 13:51   Dg Abd 2 Views  Result Date: 05/11/2016 CLINICAL DATA:  Abdominal pain. Patient reports no bowel movement for 2 weeks. EXAM: ABDOMEN - 2 VIEW COMPARISON:  None. FINDINGS: Large volume of stool throughout the entire colon. No definite rectal distention. No bowel dilatation to suggest obstruction. No abnormal gastric  distension, small gastric air-fluid level. Scattered oblong radiopaque densities throughout the abdomen are consistent with ingested pills. No free air. Atherosclerosis and vascular stents are noted. There is excreted intravenous contrast within the urinary bladder from chest CT performed 2 days prior. Blunting of both costophrenic angles, with mild progression. Bones are under mineralized. IMPRESSION: Large volume of colonic stool consistent with constipation. Multiple ingested pills scattered throughout the abdomen. No bowel obstruction. Electronically Signed   By: Jeb Levering M.D.   On: 05/11/2016 02:08     Microbiology: Recent Results (from the past 240 hour(s))  MRSA PCR Screening     Status: None   Collection Time:  05/10/16  2:55 AM  Result Value Ref Range Status   MRSA by PCR NEGATIVE NEGATIVE Final    Comment:        The GeneXpert MRSA Assay (FDA approved for NASAL specimens only), is one component of a comprehensive MRSA colonization surveillance program. It is not intended to diagnose MRSA infection nor to guide or monitor treatment for MRSA infections.      Labs: Basic Metabolic Panel:  Recent Labs Lab 05/09/16 2018 05/10/16 0323 05/11/16 0313 05/12/16 0137 05/13/16 0844  NA 126* 131* 132* 130* 133*  K 4.1 4.1 4.5 4.6 4.2  CL 91* 99* 101 99* 96*  CO2 '24 24 25 25 29  '$ GLUCOSE 274* 258* 312* 285* 179*  BUN '17 16 14 12 7  '$ CREATININE 1.10* 0.87 0.74 0.81 0.69  CALCIUM 8.9 7.7* 8.1* 8.3* 8.7*   Liver Function Tests:  Recent Labs Lab 05/10/16 0323  AST 20  ALT 16  ALKPHOS 175*  BILITOT 1.7*  PROT 5.0*  ALBUMIN 2.3*   No results for input(s): LIPASE, AMYLASE in the last 168 hours. No results for input(s): AMMONIA in the last 168 hours. CBC:  Recent Labs Lab 05/09/16 2018 05/10/16 0323 05/11/16 0313 05/12/16 0137  WBC 20.4* 14.1* 7.3 7.0  HGB 12.0 10.5* 10.1* 9.6*  HCT 35.9* 31.9* 30.5* 28.8*  MCV 90.9 90.4 90.2 89.7  PLT 270 210 177 183   Cardiac Enzymes:  Recent Labs Lab 05/10/16 0323 05/10/16 0826 05/10/16 1446  TROPONINI 0.78* 0.69* 0.73*   BNP: Invalid input(s): POCBNP CBG:  Recent Labs Lab 05/13/16 1224 05/13/16 1635 05/13/16 2153 05/14/16 0734 05/14/16 1139  GLUCAP 161* 230* 235* 77 130*    Time coordinating discharge:  Greater than 30 minutes  Signed:  Chelbie Jarnagin, DO [facility] Pager: [phone] 05/14/2016, 12:51 PM

## 2016-05-13 NOTE — Progress Notes (Signed)
Patient Name: Brenda [name] Date of Encounter: 05/13/2016  Primary Cardiologist: Dr. Beckie Salts Problem List     Principal Problem:   Sepsis, unspecified organism [facility]) Active Problems:   DM (diabetes mellitus), type 2, uncontrolled ([place])   CAD: Stents in p-mRCA, p & m Cx, p-mLAD   CAP (community acquired pneumonia)   COPD exacerbation ([place])   Dysphagia with aspiration   Acute on chronic respiratory failure with hypoxia ([name])   Protein-calorie malnutrition, moderate ([name])   Hypotension   Sepsis ([place])   Lobar pneumonia ([name])   Aspiration pneumonia ([place])     Subjective   Feels nauseous, vomiting when I am talking to her. Denies chest pain.   Inpatient Medications    Scheduled Meds: . amoxicillin-clavulanate  1 tablet Oral Q12H  . aspirin EC  81 mg Oral Daily  . buPROPion  300 mg Oral Daily  . clopidogrel  75 mg Oral Daily  . feeding supplement (GLUCERNA SHAKE)  237 mL Oral BID BM  . fluticasone furoate-vilanterol  1 puff Inhalation Daily  . guaiFENesin  1,200 mg Oral BID  . insulin aspart  0-5 Units Subcutaneous QHS  . insulin aspart  0-9 Units Subcutaneous TID WC  . insulin aspart  4 Units Subcutaneous TID WC  . insulin glargine  12 Units Subcutaneous QHS  . ipratropium-albuterol  3 mL Nebulization TID  . mouth rinse  15 mL Mouth Rinse BID  . pantoprazole  40 mg Oral Daily  . polyethylene glycol  17 g Oral BID  . predniSONE  40 mg Oral Q breakfast  . rosuvastatin  20 mg Oral Daily  . sodium chloride flush  3 mL Intravenous Q12H   Continuous Infusions: . sodium chloride 75 mL/hr at 05/12/16 1254   PRN Meds: acetaminophen **OR** acetaminophen, albuterol, cyclobenzaprine, ondansetron **OR** ondansetron (ZOFRAN) IV, traMADol   Vital Signs    Vitals:   05/13/16 0511 05/13/16 0710 05/13/16 0713 05/13/16 0853  BP: (!) 145/84   (!) 122/58  Pulse: 91   92  Resp: 19   18  Temp: 97.6 F (36.4 C)   97.6 F (36.4 C)  TempSrc: Oral   Oral  SpO2: 95%  99% 99% 97%  Weight:      Height:        Intake/Output Summary (Last 24 hours) at 05/13/16 7322 Last data filed at 05/13/16 0254  Gross per 24 hour  Intake              120 ml  Output             1350 ml  Net            -1230 ml   Filed Weights   05/10/16 0303 05/12/16 1951  Weight: 129 lb 8 oz (58.7 kg) 140 lb 14 oz (63.9 kg)    Physical Exam    GEN: Older than age appearing female in no acute distress.  HEENT: Grossly normal.  Neck: Supple, no JVD, carotid bruits, or masses. Cardiac: RRR, no murmurs, rubs, or gallops. No clubbing, cyanosis, edema.  Radials/DP/PT 2+ and equal bilaterally.  Respiratory:  Respirations regular and unlabored, Diminished in bases. Expiratory wheezing in bilateral upper lobes.  GI: Soft, nontender, nondistended, BS + x 4. MS: no deformity or atrophy. Skin: warm and dry, no rash. Neuro:  Strength and sensation are intact. Psych: AAOx3.  Normal affect.  Labs    CBC  Recent Labs  05/11/16 0313 05/12/16 0137  WBC  7.3 7.0  HGB 10.1* 9.6*  HCT 30.5* 28.8*  MCV 90.2 89.7  PLT 177 462   Basic Metabolic Panel  Recent Labs  05/11/16 0313 05/12/16 0137  NA 132* 130*  K 4.5 4.6  CL 101 99*  CO2 25 25  GLUCOSE 312* 285*  BUN 14 12  CREATININE 0.74 0.81  CALCIUM 8.1* 8.3*   Cardiac Enzymes  Recent Labs  05/10/16 1446  TROPONINI 0.73*     Telemetry    NSR  - Personally Reviewed  ECG    NSR, low voltage QRS.  - Personally Reviewed  Radiology    No results found.  Cardiac Studies   Coronary Balloon Angioplasty  Coronary Stent Intervention  Left Heart Cath and Coronary Angiography 10/06/15    1. Prox LAD to Mid LAD lesion, 99% stenosed. Mid LAD Cypher DES stent (2003), 60% stenosed with 80% stenosis distal to stent. Post intervention, there is a 0% residual stenosis. The lesion was previously treated with a drug-eluting stent greater than two years ago. 2. Ost RPDA lesion, 90% stenosed. Post POBA Only intervention, there  is a 15% residual stenosis. 3. Prox-Mid RCA Express BMS Stents (2003) patent with 40% mid & 40% distal ISR. 4. Prox Cx to Mid Cx NIR BMS (2001) 20% stenosed. 2nd Mrg-1 NIR BMS (1999) 5% stenosed. 2nd Mrg-2 lesion, 25% stenosed. Beyond the stent 5. Normal LVEDP. Relatively normal EF by nuclear stress test. LV gram not performed    Severe disease upstream and downstream from the LAD stent with moderate in-stent restenosis in the LAD. These lesions were covered in tandem with a single DES stent. Also severe ostial to proximal RPDA stenosis treated with PTCA/POBA (plain old balloon angioplasty) only   Plan / Recommendations:  Transferred to 6 Central post procedure unit for TR band removal.  She is artery on aspirin and Plavix. Continue essentially lifelong.  Smoking cessation counseling  Continue to address cardiac risk factors. - Low blood pressure occludes the ability to treat with any significant dose of beta blocker or ACE inhibitor.  From a cardiac standpoint, she would be ready for discharge as early as tomorrow. Will defer to [facility] for management of COPD.   Patient Profile     36 yof with past medical history of HLD, DM, COPD on nighttime 3L O2, long-standing tobacco abuse, CAD, PAD s/p bil femoropopliteal 1999/2000 with revision of right femoropopliteal in 2014, TIA/CVA, and history of carotid artery disease Last cath was 10/06/15 with pLAD t mLAD stenosis, 60% instent stenosis of prior LAD stent and 80% distal to stent stenosis.   Pt ha PTCA and stent placed, one to cover all three areas. Also 90% Rt PDA stensosis with POBA alone. Previous stents to RCA were patent along with previous LCX stents.   Presented to ED with chest pain.   Assessment & Plan    1. Chest pain, with history of CAD: Presented with chest pain reminiscent of her angina in May of this year prior to stenting to her mid LAD. Troponin was 0.78>>0.69>>0.73. Likely demand ischemia without a ACS  trend.   See cath report above from May 2017. Also had severe ostial to proximal RPDA stenosis that was treated with PTCA/POBA.   Continue ASA and Plavix, she would benefit from isosorbide, however carries an intolerance to it, stating it drops her BP too low. MD to advise on starting '15mg'$  isosorbide while admitted and monitoring BP.   Was on heparin gtt for 48 hours, it was  discontinued yesterday. There was some mention of an outpatient stress test, MD to advise.   2. Aspiration PNA: management per primary team. 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   3. Severe COPD: Followed by Dr. [place] Bells.   4. Tobacco abuse: Encouraged cessation.    Signed, Arbutus Leas, NP  05/13/2016, 9:22 AM    I have seen, examined and evaluated the patient this AM along with Jettie Booze, NP.  After reviewing all the available data and chart, we discussed the patients laboratory, study & physical findings as well as symptoms in detail. I agree with her findings, examination as well as impression recommendations as per our discussion.    She continued to have intermittent chest discomfort episodes that may be related to mild coronary spasm. Further troponin elevation and recent PCI, PICC is not unreasonable to do an outpatient stress test to compare to her pre-PCI test from earlier this year.  Since she has been intolerant of Imdur in the past, I would prefer trying low-dose beta blocker - bisoprolol 2.5 mg for antianginal effect. Her heart rate is deathly high enough to tolerate.  No longer on heparin. No aspiration pneumonia per primary team.  Would anticipate that she is probably ready for discharge soon, would like to see how she does with one dose of beta blocker prior to discharge. Agree that she will need TCM follow-up in our clinic & Myoview ST after 3-4 weeks).    Glenetta Hew, M.D., M.S. Interventional Cardiologist   Pager # [phone] Phone # [phone] [address] [place], [place] [address]

## 2016-05-14 ENCOUNTER — Encounter: Payer: Self-pay | Admitting: Vascular Surgery

## 2016-05-14 LAB — GLUCOSE, CAPILLARY
GLUCOSE-CAPILLARY: 130 mg/dL — AB (ref 65–99)
Glucose-Capillary: 77 mg/dL (ref 65–99)

## 2016-05-14 LAB — PROCALCITONIN: Procalcitonin: 0.11 ng/mL

## 2016-05-14 LAB — HEMOGLOBIN A1C
HEMOGLOBIN A1C: 9 % — AB (ref 4.8–5.6)
Mean Plasma Glucose: 212 mg/dL

## 2016-05-14 MED ORDER — INSULIN GLARGINE 100 UNIT/ML ~~LOC~~ SOLN: 10.0000 [IU] | Freq: Every day | SUBCUTANEOUS | 0 refills | Status: DC

## 2016-05-14 MED ORDER — BISOPROLOL FUMARATE 5 MG PO TABS: 2.5000 mg | ORAL_TABLET | Freq: Every day | ORAL | 1 refills | Status: DC

## 2016-05-14 MED ORDER — RESOURCE THICKENUP CLEAR PO POWD: ORAL | 0 refills | Status: DC

## 2016-05-14 MED ORDER — AMOXICILLIN-POT CLAVULANATE 875-125 MG PO TABS: 1.0000 | ORAL_TABLET | Freq: Two times a day (BID) | ORAL | 0 refills | Status: DC

## 2016-05-14 NOTE — Progress Notes (Signed)
Speech Language Pathology Treatment: Dysphagia  Patient Details Name: Brenda [name] MRN: [id number] DOB: 1950-10-18 Today's Date: 05/14/2016 Time: 1638-4536 SLP Time Calculation (min) (ACUTE ONLY): 22 min  Assessment / Plan / Recommendation Clinical Impression  F/u for dysphagia.  Pt has lived with chronic swallowing issues for seven years. We reviewed the deficits related to radiation (ongoing/progressive), as well as dysmotility of the esophagus and how that impacts function.  Pt verbalized understanding. We reviewed use of the water protocol between meals; the protective value of nectar-thickened liquids, but also [name]' need to balance QOL with risk.  She enjoys coffee in the mornings without the thickener - we discussed ways to maximize safety.  Recommend continuing current diet - focusing on moist/soft solids when able; thick liquids during meals.  No further inpatient SLP f/u is warranted. Our services will sign off.    HPI HPI: 65 y.o. female with a past medical history significant for COPD on nightly O2, CAD s/p PCI, Tongue cancer s/p rad tx c/b dysphagia and recurrent aspiration pneumonia, and PVD who presents with chest pain and sob x 1 day. Cardic work up stable, chest imaging revealed right lower lobe PNA.  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       SLP Plan  All goals met     Recommendations  Diet recommendations: Dysphagia 3 (mechanical soft) Liquids provided via: Cup;Straw Medication Administration: Whole meds with puree (crush if large) Supervision: Patient able to self feed Compensations: Slow rate;Small sips/bites;Multiple dry swallows after each bite/sip Postural Changes and/or Swallow Maneuvers: Seated upright 90 degrees                Oral Care Recommendations: Oral care prior to ice  chip/H20;Oral care BID Plan: All goals met       GO                Juan Quam Laurice 05/14/2016, 10:13 AM

## 2016-05-14 NOTE — Progress Notes (Signed)
Patient discharged to home with daughter. Patient with Home O2.  Discharged instructions reviewed. Medications reviewed. IV removed. Patients belongings in tow. Patient left unit in stable condition  In wheelchair.   Sheliah Plane RN

## 2016-05-14 NOTE — Care Management Important Message (Signed)
Important Message  Patient Details  Name: Brenda [name] MRN: [id number] Date of Birth: [birth year]   Medicare Important Message Given:  Yes    Jennefer Kopp [address] 05/14/2016, 12:25 PM

## 2016-05-14 NOTE — Telephone Encounter (Signed)
Pt still in hosp-05/09/2016 - present (5 days); [place]

## 2016-05-14 NOTE — Progress Notes (Signed)
Inpatient Diabetes Program Recommendations  AACE/ADA: New Consensus Statement on Inpatient Glycemic Control (2015)  Target Ranges:  Prepandial:   less than 140 mg/dL      Peak postprandial:   less than 180 mg/dL (1-2 hours)      Critically ill patients:  140 - 180 mg/dL   Results for Brenda [name], Brenda [name] (MRN [id number]) as of 05/14/2016 12:19  Ref. Range 05/14/2016 07:34 05/14/2016 11:39  Glucose-Capillary Latest Ref Range: 65 - 99 mg/dL 77 130 (H)    Current Insulin Orders: Lantus 12 units QHS      Novolog Sensitive Correction Scale/ SSI (0-9 units) TID AC + HS      Novolog 4 units TID with meals       MD- Please consider reducing Lantus to 8 units QHS   CBG this AM was only 77 mg/dl and note that Prednisone has been stopped.     --Will follow patient during hospitalization--  Wyn Quaker RN, MSN, CDE Diabetes Coordinator Inpatient Glycemic Control Team Team Pager: [phone] (8a-5p)

## 2016-05-14 NOTE — Care Management Note (Addendum)
Case Management Note  Patient Details  Name: Brenda [name] MRN: [id number] Date of Birth: 08/17/1950  Subjective/Objective:    CM following for progression and d/c planning.                 Action/Plan: 05/14/2016 Met with pt re increased need for oxygen. Pt receives home oxygen from [facility]. Doctor is currently recommending oxygen at 2l/min. Pt has been using oxygen at home only at night or as needed during the day. This CM notified [facility] of increased need for oxygen. Pt states that she has 3:1, tub seat and walker, no other DME needs. Pt states that she has had HHPT and HHOT in the past and does not need these services, however per pt she makes adjustment to her oxygen and is sometimes noncompliant . This CM recommended a HHRN to help this pt with noncompliance issues.   Expected Discharge Date:  05/15/2016             Expected Discharge Plan:  [place]  In-House Referral:  NA  Discharge planning Services  CM Consult  Post Acute Care Choice:  Durable Medical Equipment Choice offered to:  Patient  DME Arranged:  Oxygen DME Agency:  [place]:  RN [facility] Agency:  [place]  Status of Service:  In process, will continue to follow  If discussed at Long Length of Stay Meetings, dates discussed:    Additional Comments:  Adron Bene, RN 05/14/2016, 11:13 AM

## 2016-05-14 NOTE — Progress Notes (Signed)
Patient Name: Brenda [name] Date of Encounter: 05/14/2016  Primary Cardiologist: Dr. Beckie Salts Problem List     Principal Problem:   Sepsis, unspecified organism [facility]) Active Problems:   DM (diabetes mellitus), type 2, uncontrolled ([place])   CAD: Stents in p-mRCA, p & m Cx, p-mLAD   CAP (community acquired pneumonia)   COPD exacerbation ([place])   Dysphagia with aspiration   Acute on chronic respiratory failure with hypoxia ([name])   Protein-calorie malnutrition, moderate ([place])   Hypotension   Sepsis ([place])   Lobar pneumonia ([employer])   Aspiration pneumonia ([place])     Subjective   Feels well, has walked 3 times in the halls, no dizziness, chest pain or SOB.   Inpatient Medications    Scheduled Meds: . amoxicillin-clavulanate  875 mg Oral Q12H  . aspirin EC  81 mg Oral Daily  . bisoprolol  2.5 mg Oral Daily  . buPROPion  300 mg Oral Daily  . clopidogrel  75 mg Oral Daily  . feeding supplement (GLUCERNA SHAKE)  237 mL Oral BID BM  . fluticasone furoate-vilanterol  1 puff Inhalation Daily  . guaiFENesin  1,200 mg Oral BID  . insulin aspart  0-5 Units Subcutaneous QHS  . insulin aspart  0-9 Units Subcutaneous TID WC  . insulin aspart  4 Units Subcutaneous TID WC  . insulin glargine  12 Units Subcutaneous QHS  . ipratropium-albuterol  3 mL Nebulization TID  . mouth rinse  15 mL Mouth Rinse BID  . pantoprazole  40 mg Oral Daily  . polyethylene glycol  17 g Oral BID  . predniSONE  40 mg Oral Q breakfast  . rosuvastatin  20 mg Oral Daily  . sodium chloride flush  3 mL Intravenous Q12H   Continuous Infusions:  PRN Meds: acetaminophen **OR** acetaminophen, albuterol, cyclobenzaprine, ondansetron **OR** ondansetron (ZOFRAN) IV, RESOURCE THICKENUP CLEAR, traMADol   Vital Signs    Vitals:   05/13/16 1331 05/13/16 1900 05/13/16 1943 05/13/16 2249  BP:  131/79  135/73  Pulse:  88  97  Resp:  18  20  Temp:  98.8 F (37.1 C)  97.8 F (36.6 C)  TempSrc:  Oral  Oral    SpO2: 98% 95% 97% 92%  Weight:      Height:        Intake/Output Summary (Last 24 hours) at 05/14/16 0802 Last data filed at 05/14/16 8299  Gross per 24 hour  Intake              720 ml  Output             4020 ml  Net            -3300 ml   Filed Weights   05/10/16 0303 05/12/16 1951  Weight: 129 lb 8 oz (58.7 kg) 140 lb 14 oz (63.9 kg)    Physical Exam  GEN: Older than age appearing female in no acute distress.  HEENT: Grossly normal.  Neck: Supple, no JVD, carotid bruits, or masses. Cardiac: RRR, no murmurs, rubs, or gallops. No clubbing, cyanosis, edema.  Radials/DP/PT 2+ and equal bilaterally.  Respiratory:  Respirations regular and unlabored, Diminished in bases. Expiratory wheezing in bilateral upper lobes.  GI: Soft, nontender, nondistended, BS + x 4. MS: no deformity or atrophy. Skin: warm and dry, no rash. Neuro:  Strength and sensation are intact. Psych: AAOx3.  Normal affect.  Labs    CBC  Recent Labs  05/12/16 0137  WBC  7.0  HGB 9.6*  HCT 28.8*  MCV 89.7  PLT 756   Basic Metabolic Panel  Recent Labs  05/12/16 0137 05/13/16 0844  NA 130* 133*  K 4.6 4.2  CL 99* 96*  CO2 25 29  GLUCOSE 285* 179*  BUN 12 7  CREATININE 0.81 0.69  CALCIUM 8.3* 8.7*   Hemoglobin A1C  Recent Labs  05/13/16 0844  HGBA1C 9.0*     Telemetry     NSR- Personally Reviewed  ECG    NSR, low voltage QRS - Personally Reviewed  Radiology    Dg Esophagus  Result Date: 05/13/2016 CLINICAL DATA:  Esophageal stricture. Worsening dysphasia. Projectile vomiting. EXAM: ESOPHOGRAM/BARIUM SWALLOW TECHNIQUE: Single contrast examination was performed using thick and thin barium. FLUOROSCOPY TIME:  Fluoroscopy Time:  2 minutes 12 seconds Radiation Exposure Index (if provided by the fluoroscopic device): Number of Acquired Spot Images: 0 COMPARISON:  None. FINDINGS: Double contrast evaluation esophagus is limited, as the patient was felt a likely experience difficulty  swallowing the effervescent crystals. No gross mucosal abnormality identified. Evaluation of peristalsis demonstrates incomplete primary peristaltic wave with significant contrast stasis throughout the thoracic esophagus. In the RAO orientation single contrast evaluation demonstrates no persistent narrowing or stricture. A 13 mm barium tablet has delayed passage in the mid and lower esophagus. This delayed persistent even with subsequent barium swallows. IMPRESSION: 1. Degraded exam secondary to patient immobility and difficulty with multiple swallows. 2. Moderate esophageal dysmotility, likely presbyesophagus. 3. No definite evidence for esophageal mass or stricture. 4. Delayed passage of a 13 mm barium tablet in the mid and lower esophagus, favor to be related to dysmotility. Electronically Signed   By: Kerby Moors M.D.   On: 05/13/2016 13:51     Patient Profile  65 yo female with past medical history of HLD, DM, COPD on nighttime 3L O2, long-standing tobacco abuse, CAD, PAD s/p bil femoropopliteal 1999/2000 with revision of right femoropopliteal in 2014, TIA/CVA, and history of carotid artery disease Last cath was 10/06/15 with pLAD t mLAD stenosis, 60% instent stenosis of prior LAD stent and 80% distal to stent stenosis.   Pt ha PTCA and stent placed, one to cover all three areas. Also 90% Rt PDA stensosis with POBA alone. Previous stents to RCA were patent along with previous LCX stents. Presented to ED with chest pain    Assessment & Plan    1. Chest pain, with history of CAD: Presented with chest pain reminiscent of her angina in May of this year prior to stenting to her mid LAD. Troponin was 0.78>>0.69>>0.73. Likely demand ischemia without a ACS trend.   See cath report from May 2017. Also had severe ostial to proximal RPDA stenosis that was treated with PTCA/POBA.   Continue ASA and Plavix, she would benefit from isosorbide, however carries an intolerance to it, stating it drops  her BP too low. Started on bisoprolol for anti-anginal, which she is tolerating well. Continue at discharge.   Will arrange TCM follow up and will need Myoview ST after 3-4 weeks.   2. Severe COPD: Followed by Dr. [place] Bells.   3. Tobacco abuse: Encouraged cessation  Signed, Arbutus Leas, NP  05/14/2016, 8:02 AM   I have seen, examined and evaluated the patient this PM along with Jettie Booze, NP.  After reviewing all the available data and chart, we discussed the patients laboratory, study & physical findings as well as symptoms in detail. I agree with her findings, examination as well as impression  recommendations as per our discussion.    Rindy looks good today. She has not had any further chest pain episodes. She's been walking in the hallway without difficulty. She tolerated the beta blocker dose well. This can be titrated up as an outpatient.   At this point I think she is stable for discharge from a cardiac standpoint. We have arranged outpatient follow-up. The plan would be for Myoview stress test probably after Christmas time. If she has any recurrence of her angina symptoms in the interim, we will probably consider moving directed toward cath as opposed to his stress test.    Glenetta Hew, M.D., M.S. Interventional Cardiologist   Pager # [phone] Phone # [phone] [address]. [place], [place] [address]

## 2016-05-15 NOTE — Telephone Encounter (Signed)
Patient contacted regarding discharge from [facility] 05/14/16.  Patient understands to follow up with provider Dr Ellyn Hack on 05/22/16 at 10:15 AM at [place]. Patient understands discharge instructions? yes  Patient understands medications and regiment? yes  Patient understands to bring all medications to this visit? yes

## 2016-05-20 ENCOUNTER — Ambulatory Visit (INDEPENDENT_AMBULATORY_CARE_PROVIDER_SITE_OTHER): Payer: Medicare Other | Admitting: Pulmonary Disease

## 2016-05-20 ENCOUNTER — Inpatient Hospital Stay (HOSPITAL_COMMUNITY): Payer: Medicare Other

## 2016-05-20 ENCOUNTER — Encounter (HOSPITAL_COMMUNITY): Payer: Self-pay

## 2016-05-20 ENCOUNTER — Inpatient Hospital Stay (HOSPITAL_COMMUNITY)
Admission: AD | Admit: 2016-05-20 | Discharge: 2016-05-24 | DRG: 177 | Disposition: A | Discharge status: Home / Self Care | Payer: Medicare Other | Source: Ambulatory Visit | Attending: Pulmonary Disease | Admitting: Pulmonary Disease

## 2016-05-20 VITALS — BP 126/58 | HR 90 | Temp 97.9°F | Ht 65.0 in | Wt 128.0 lb

## 2016-05-20 DIAGNOSIS — E1151 Type 2 diabetes mellitus with diabetic peripheral angiopathy without gangrene: Secondary | ICD-10-CM | POA: Diagnosis present

## 2016-05-20 DIAGNOSIS — Z923 Personal history of irradiation: Secondary | ICD-10-CM | POA: Diagnosis not present

## 2016-05-20 DIAGNOSIS — B9620 Unspecified Escherichia coli [E. coli] as the cause of diseases classified elsewhere: Secondary | ICD-10-CM | POA: Diagnosis present

## 2016-05-20 DIAGNOSIS — E11649 Type 2 diabetes mellitus with hypoglycemia without coma: Secondary | ICD-10-CM | POA: Diagnosis not present

## 2016-05-20 DIAGNOSIS — E871 Hypo-osmolality and hyponatremia: Secondary | ICD-10-CM | POA: Diagnosis present

## 2016-05-20 DIAGNOSIS — Z86718 Personal history of other venous thrombosis and embolism: Secondary | ICD-10-CM | POA: Diagnosis not present

## 2016-05-20 DIAGNOSIS — J9621 Acute and chronic respiratory failure with hypoxia: Secondary | ICD-10-CM | POA: Diagnosis not present

## 2016-05-20 DIAGNOSIS — Z8673 Personal history of transient ischemic attack (TIA), and cerebral infarction without residual deficits: Secondary | ICD-10-CM | POA: Diagnosis not present

## 2016-05-20 DIAGNOSIS — R1310 Dysphagia, unspecified: Secondary | ICD-10-CM | POA: Diagnosis present

## 2016-05-20 DIAGNOSIS — I2510 Atherosclerotic heart disease of native coronary artery without angina pectoris: Secondary | ICD-10-CM | POA: Diagnosis present

## 2016-05-20 DIAGNOSIS — K219 Gastro-esophageal reflux disease without esophagitis: Secondary | ICD-10-CM | POA: Diagnosis present

## 2016-05-20 DIAGNOSIS — Z85810 Personal history of malignant neoplasm of tongue: Secondary | ICD-10-CM | POA: Diagnosis not present

## 2016-05-20 DIAGNOSIS — K449 Diaphragmatic hernia without obstruction or gangrene: Secondary | ICD-10-CM | POA: Diagnosis present

## 2016-05-20 DIAGNOSIS — F329 Major depressive disorder, single episode, unspecified: Secondary | ICD-10-CM | POA: Diagnosis present

## 2016-05-20 DIAGNOSIS — J9611 Chronic respiratory failure with hypoxia: Secondary | ICD-10-CM

## 2016-05-20 DIAGNOSIS — R739 Hyperglycemia, unspecified: Secondary | ICD-10-CM | POA: Diagnosis not present

## 2016-05-20 DIAGNOSIS — R911 Solitary pulmonary nodule: Secondary | ICD-10-CM | POA: Diagnosis present

## 2016-05-20 DIAGNOSIS — J690 Pneumonitis due to inhalation of food and vomit: Principal | ICD-10-CM | POA: Diagnosis present

## 2016-05-20 DIAGNOSIS — E1165 Type 2 diabetes mellitus with hyperglycemia: Secondary | ICD-10-CM | POA: Diagnosis present

## 2016-05-20 DIAGNOSIS — Z955 Presence of coronary angioplasty implant and graft: Secondary | ICD-10-CM | POA: Diagnosis not present

## 2016-05-20 DIAGNOSIS — J441 Chronic obstructive pulmonary disease with (acute) exacerbation: Secondary | ICD-10-CM | POA: Diagnosis present

## 2016-05-20 DIAGNOSIS — J155 Pneumonia due to Escherichia coli: Secondary | ICD-10-CM | POA: Diagnosis not present

## 2016-05-20 DIAGNOSIS — E876 Hypokalemia: Secondary | ICD-10-CM | POA: Diagnosis present

## 2016-05-20 DIAGNOSIS — J189 Pneumonia, unspecified organism: Secondary | ICD-10-CM

## 2016-05-20 DIAGNOSIS — J181 Lobar pneumonia, unspecified organism: Secondary | ICD-10-CM

## 2016-05-20 LAB — CBC WITH DIFFERENTIAL/PLATELET
BASOS PCT: 0 %
Basophils Absolute: 0 10*3/uL (ref 0.0–0.1)
Eosinophils Absolute: 0.1 10*3/uL (ref 0.0–0.7)
Eosinophils Relative: 1 %
HEMATOCRIT: 30.2 % — AB (ref 36.0–46.0)
HEMOGLOBIN: 10 g/dL — AB (ref 12.0–15.0)
LYMPHS ABS: 0.8 10*3/uL (ref 0.7–4.0)
Lymphocytes Relative: 6 %
MCH: 29.2 pg (ref 26.0–34.0)
MCHC: 33.1 g/dL (ref 30.0–36.0)
MCV: 88.3 fL (ref 78.0–100.0)
MONO ABS: 0.7 10*3/uL (ref 0.1–1.0)
MONOS PCT: 5 %
NEUTROS ABS: 12.7 10*3/uL — AB (ref 1.7–7.7)
Neutrophils Relative %: 88 %
Platelets: 211 10*3/uL (ref 150–400)
RBC: 3.42 MIL/uL — ABNORMAL LOW (ref 3.87–5.11)
RDW: 15.6 % — AB (ref 11.5–15.5)
WBC: 14.3 10*3/uL — ABNORMAL HIGH (ref 4.0–10.5)

## 2016-05-20 LAB — PROCALCITONIN: Procalcitonin: 1.72 ng/mL

## 2016-05-20 LAB — MAGNESIUM: MAGNESIUM: 1.8 mg/dL (ref 1.7–2.4)

## 2016-05-20 LAB — COMPREHENSIVE METABOLIC PANEL
ALBUMIN: 2.2 g/dL — AB (ref 3.5–5.0)
ALT: 12 U/L — AB (ref 14–54)
AST: 14 U/L — AB (ref 15–41)
Alkaline Phosphatase: 161 U/L — ABNORMAL HIGH (ref 38–126)
Anion gap: 7 (ref 5–15)
BILIRUBIN TOTAL: 0.7 mg/dL (ref 0.3–1.2)
BUN: 22 mg/dL — AB (ref 6–20)
CHLORIDE: 96 mmol/L — AB (ref 101–111)
CO2: 30 mmol/L (ref 22–32)
CREATININE: 0.91 mg/dL (ref 0.44–1.00)
Calcium: 8 mg/dL — ABNORMAL LOW (ref 8.9–10.3)
GFR calc Af Amer: >60 mL/min (ref >60)
GLUCOSE: 235 mg/dL — AB (ref 65–99)
Potassium: 3.8 mmol/L (ref 3.5–5.1)
Sodium: 133 mmol/L — ABNORMAL LOW (ref 135–145)
Total Protein: 5.2 g/dL — ABNORMAL LOW (ref 6.5–8.1)

## 2016-05-20 LAB — GLUCOSE, CAPILLARY
GLUCOSE-CAPILLARY: 219 mg/dL — AB (ref 65–99)
Glucose-Capillary: 144 mg/dL — ABNORMAL HIGH (ref 65–99)
Glucose-Capillary: 362 mg/dL — ABNORMAL HIGH (ref 65–99)

## 2016-05-20 MED ORDER — INSULIN GLARGINE 100 UNIT/ML ~~LOC~~ SOLN
10.0000 [IU] | Freq: Every day | SUBCUTANEOUS | Status: DC
  Administered 2016-05-20 – 2016-05-21 (×2): 10 [IU] via SUBCUTANEOUS
  Filled 2016-05-20 (×3): qty 0.1

## 2016-05-20 MED ORDER — INSULIN ASPART 100 UNIT/ML ~~LOC~~ SOLN
0.0000 [IU] | SUBCUTANEOUS | Status: DC
  Administered 2016-05-20: 7 [IU] via SUBCUTANEOUS
  Administered 2016-05-20: 20 [IU] via SUBCUTANEOUS
  Administered 2016-05-21: 3 [IU] via SUBCUTANEOUS
  Administered 2016-05-21: 4 [IU] via SUBCUTANEOUS
  Administered 2016-05-21: 3 [IU] via SUBCUTANEOUS

## 2016-05-20 MED ORDER — DOCUSATE SODIUM 100 MG PO CAPS: 100.0000 mg | ORAL_CAPSULE | Freq: Every day | ORAL | Status: DC | PRN

## 2016-05-20 MED ORDER — ASPIRIN EC 81 MG PO TBEC
81.0000 mg | DELAYED_RELEASE_TABLET | Freq: Every day | ORAL | Status: DC
  Administered 2016-05-20 – 2016-05-24 (×5): 81 mg via ORAL
  Filled 2016-05-20 (×5): qty 1

## 2016-05-20 MED ORDER — RESOURCE THICKENUP CLEAR PO POWD
ORAL | Status: DC | PRN
  Administered 2016-05-20: 16:00:00 via ORAL
  Filled 2016-05-20: qty 125

## 2016-05-20 MED ORDER — PANTOPRAZOLE SODIUM 40 MG PO TBEC
40.0000 mg | DELAYED_RELEASE_TABLET | Freq: Every day | ORAL | Status: DC
  Administered 2016-05-20 – 2016-05-24 (×5): 40 mg via ORAL
  Filled 2016-05-20 (×5): qty 1

## 2016-05-20 MED ORDER — FLUTICASONE PROPIONATE 50 MCG/ACT NA SUSP
2.0000 | Freq: Every day | NASAL | Status: DC
  Administered 2016-05-20 – 2016-05-24 (×4): 2 via NASAL
  Filled 2016-05-20: qty 16

## 2016-05-20 MED ORDER — ACETAMINOPHEN 325 MG PO TABS: 650.0000 mg | ORAL_TABLET | Freq: Four times a day (QID) | ORAL | Status: DC | PRN

## 2016-05-20 MED ORDER — DEXTROSE 5 % IV SOLN
1.0000 g | Freq: Three times a day (TID) | INTRAVENOUS | Status: DC
  Administered 2016-05-20 – 2016-05-23 (×9): 1 g via INTRAVENOUS
  Filled 2016-05-20 (×10): qty 1

## 2016-05-20 MED ORDER — GUAIFENESIN ER 600 MG PO TB12
1200.0000 mg | ORAL_TABLET | Freq: Two times a day (BID) | ORAL | Status: DC
  Administered 2016-05-20 – 2016-05-24 (×9): 1200 mg via ORAL
  Filled 2016-05-20 (×11): qty 2

## 2016-05-20 MED ORDER — GLUCERNA SHAKE PO LIQD
237.0000 mL | Freq: Three times a day (TID) | ORAL | Status: DC
  Filled 2016-05-20 (×4): qty 237

## 2016-05-20 MED ORDER — DEXTROSE-NACL 5-0.45 % IV SOLN
INTRAVENOUS | Status: DC
  Administered 2016-05-20: 14:00:00 via INTRAVENOUS

## 2016-05-20 MED ORDER — ROSUVASTATIN CALCIUM 20 MG PO TABS
20.0000 mg | ORAL_TABLET | Freq: Every day | ORAL | Status: DC
  Administered 2016-05-20 – 2016-05-24 (×5): 20 mg via ORAL
  Filled 2016-05-20 (×5): qty 1

## 2016-05-20 MED ORDER — IPRATROPIUM-ALBUTEROL 0.5-2.5 (3) MG/3ML IN SOLN: 3.0000 mL | Freq: Two times a day (BID) | RESPIRATORY_TRACT | Status: DC | PRN

## 2016-05-20 MED ORDER — BUPROPION HCL ER (XL) 300 MG PO TB24
300.0000 mg | ORAL_TABLET | Freq: Every day | ORAL | Status: DC
  Administered 2016-05-20 – 2016-05-24 (×5): 300 mg via ORAL
  Filled 2016-05-20 (×5): qty 1

## 2016-05-20 MED ORDER — ALBUTEROL SULFATE (2.5 MG/3ML) 0.083% IN NEBU: 2.5000 mg | INHALATION_SOLUTION | RESPIRATORY_TRACT | Status: DC | PRN

## 2016-05-20 MED ORDER — IBUPROFEN 200 MG PO TABS: 400.0000 mg | ORAL_TABLET | Freq: Four times a day (QID) | ORAL | Status: DC | PRN

## 2016-05-20 MED ORDER — LORATADINE 10 MG PO TABS
10.0000 mg | ORAL_TABLET | Freq: Every day | ORAL | Status: DC
  Administered 2016-05-20 – 2016-05-24 (×5): 10 mg via ORAL
  Filled 2016-05-20 (×5): qty 1

## 2016-05-20 MED ORDER — ENSURE ENLIVE PO LIQD: 237.0000 mL | Freq: Two times a day (BID) | ORAL | Status: DC

## 2016-05-20 MED ORDER — HEPARIN SODIUM (PORCINE) 5000 UNIT/ML IJ SOLN
5000.0000 [IU] | Freq: Three times a day (TID) | INTRAMUSCULAR | Status: DC
  Administered 2016-05-20 – 2016-05-24 (×12): 5000 [IU] via SUBCUTANEOUS
  Filled 2016-05-20 (×13): qty 1

## 2016-05-20 MED ORDER — CLOPIDOGREL BISULFATE 75 MG PO TABS
75.0000 mg | ORAL_TABLET | Freq: Every day | ORAL | Status: DC
  Administered 2016-05-20 – 2016-05-24 (×5): 75 mg via ORAL
  Filled 2016-05-20 (×5): qty 1

## 2016-05-20 MED ORDER — FLUTICASONE FUROATE-VILANTEROL 100-25 MCG/INH IN AEPB
1.0000 | INHALATION_SPRAY | Freq: Every day | RESPIRATORY_TRACT | Status: DC
  Administered 2016-05-20: 16:00:00 1 via RESPIRATORY_TRACT
  Filled 2016-05-20: qty 28

## 2016-05-20 MED ORDER — METHYLPREDNISOLONE SODIUM SUCC 125 MG IJ SOLR
60.0000 mg | Freq: Two times a day (BID) | INTRAMUSCULAR | Status: DC
  Administered 2016-05-20 – 2016-05-22 (×5): 60 mg via INTRAVENOUS
  Filled 2016-05-20 (×5): qty 2

## 2016-05-20 NOTE — Progress Notes (Signed)
[place] Progress Note Patient Name: Brenda [name] DOB: 05/14/1951 MRN: [id number]   Date of Service  05/20/2016  HPI/Events of Note  Diabetic , CBG 336  eICU Interventions  lantus 10 SSI-resistant May need meal coverage based on intake     Intervention Category Intermediate Interventions: Hyperglycemia - evaluation and treatment  ALVA,RAKESH V. 05/20/2016, 5:16 PM

## 2016-05-20 NOTE — H&P (Addendum)
Synopsis: Severe COPD, still actively smoking as of June 2017, Also with dysphagia and recurrent aspiration leading to recurrent episodes of pneumonia. She also has coronary artery disease and underwent two-vessel coronary intervention in 2017. Simple spirometry FEV1 June 2016 0.67 L (27% predicted) with clear airflow obstruction  HPI     Chief Complaint  Patient presents with  . Follow-up    pt hospitalized last week for PNA- pt currently c/o chest tightness, fatigue, weakness, sinus congestion, prod cough with gray mucus.     Pati was doing better when she was discharged from the hospital last week.  She says she has started to worsen with increasing weakness a few days after the hospital discharge.  She says that she feels some chest pain and they are consider an outpatient heart catheterization.  She notes some ankle swelling lately.    She was told that she was aspirating again.  She continues to struggle with food getting stuck in her throat.  She continues to follow with Dr. Benson Norway for this.    She is coughing up more mucus and wheezing.        Past Medical History:  Diagnosis Date  . Arthritis    "hands" (11/26/2012)  . CAD S/P percutaneous coronary angioplasty 1999; 2001;2003, 10/2014   a) BMS- mCx; b) re-do PCI for prog of Dz (NIR BMS 3.5 x 12); c) staged PCI: p-mRCA Express II BMS 3.0x24 & 3.5 x 16 --> p-mLAD Cyper DES 3.0 x 13 & PTCA of D2 (2.5 balloon); 10/2015 PCI p-m LAD Promus DES 2.75 x 38 (covers pre&post-stent ISR), POBA of ostial rPDA  . Cancer of base of tongue ([place]) 2010   "& lymph nodes @ right neck" (11/26/2012  . Carotid artery occlusion   . Chronic bronchitis ([place])   . COPD (chronic obstructive pulmonary disease) with emphysema ([place])   . Depression   . GERD (gastroesophageal reflux disease)   . H/O hiatal hernia   . History of blood transfusion   . History of DVT (deep vein thrombosis)   . Hx of radiation therapy   . Hx of radiation therapy  01/16/09 - 03/06/09   base of tongue, right neck node  . Hypercholesteremia   . Myocardial infarction 1999, 2003  . PAD (peripheral artery disease) ([name])    status post bilateral femoropopliteal bypass grafting --> Dr. Sherren Mocha Early; -  02/2013 Revision of R Fem-AKPop to BK Pop.  . Patent foramen ovale February 2013   Small. Small PFO noted on TTE for TIA/CVA R-L + Bubble study with valsalva  . Pneumonia   . Stroke [facility])    left side weakness remains (11/26/2012)  . Transient ischemic attack (TIA) February 2013  . Type II diabetes mellitus ([place])       Review of Systems  Constitutional: Positive for chills. Negative for fatigue and fever.  HENT: Negative for postnasal drip, rhinorrhea and sinus pressure.   Respiratory: Positive for cough, shortness of breath and wheezing.   Cardiovascular: Negative for chest pain, palpitations and leg swelling.       Objective:   Physical Exam    Vitals:   05/20/16 1022  BP: (!) 126/58  Pulse: 90  Temp: 97.9 F (36.6 C)  TempSrc: Oral  SpO2: 92%  Weight: 128 lb (58.1 kg)  Height: '5\' 5"'$  (1.651 m)   3 L [place]  Gen: chronically ill appearing, kyphosis HENT: OP clear, TM's clear, neck supple PULM: Wheezing bilaterally, some crackles right base, poor movement CV:  RRR, no mgr, trace edema GI: BS+, soft, nontender Psyche: normal mood and affect  Records from her hospitalization from December 2017 reviewed where she had a CT scan that showed multifocal opacities in the right lower lobe right middle lobe and left lower lobe. She was treated for healthcare associated pneumonia with IV antibiotics.  December 2017 chest CT images personally reviewed showing no pulmonary embolism but severe emphysema bilaterally with consolidation in the right middle lobe and right lower lobe. There is also a spiculated opacity in the left lower lobe, I question whether or not this is consolidation.     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 Her lung sounds severely congested with significant wheezing despite being on an antibiotic up until today. I do not believe that just adding oral prednisone and extending a course of antibiotics will make her better as she has worsened with oral antibiotics at home over the last several days.  Overall her condition is severe and she may be approaching end-stage disease.  For her COPD exacerbation: IV Solu-Medrol 60 every 12 hours Continue Breo daily Albuterol as needed  For her aspiration pneumonia: Treat with IV cefepime Chest x-ray CBC  For her DM2: SSI Diabetic diet  For her Dysphagia: Dysphagia 3 diet Remain sitting upright while eating Small sips/bites  For her spiculated pulmonary nodule: Will need repeat CT chest in February 2018  For her acute on chronic respiratory failure with hypoxemia: Treat with nasal cannula oxygen to maintain O2 saturation greater than 80%  [facility] [place] [facility] Pager: [phone]: [phone] 3pm or if no response, call [phone]

## 2016-05-20 NOTE — Patient Instructions (Addendum)
We will arrange for a bed at [facility] There we will give you IV Solu-Medrol and IV antibiotics  He will be followed by the pulmonary service there

## 2016-05-20 NOTE — Progress Notes (Addendum)
Pharmacy Antibiotic Note  Brenda [name] is a 65 y.o. female admitted on 05/20/2016 with COPD exacerbation and recurrent aspiration pneumonia. She recently finished course of antibiotics with Augmentin on 05/16/2016. Pharmacy has been consulted for Cefepime dosing.  Plan: Cefepime 1g IV q8h Consider switching to Zosyn if need better anaerobic coverage  Monitor renal function, culture results, clinical course.      Temp (24hrs), Avg:97.9 F (36.6 C), Min:97.8 F (36.6 C), Max:97.9 F (36.6 C)   Recent Labs Lab 05/20/16 1319  WBC 14.3*  CREATININE 0.91    Estimated Creatinine Clearance: 55.5 mL/min (by C-G formula based on SCr of 0.91 mg/dL).    Allergies  Allergen Reactions  . Tape Hives and Other (See Comments)    USE PAPER TAPE ONLY- Adhesive peels off skin-makes pt. Raw.  . Isosorbide Other (See Comments)    Drops BP too low  . Nicoderm [Nicotine] Rash    To the PATCH only, breakouts on skin    Antimicrobials this admission: 12/18 >> Cefepime >>  Dose adjustments this admission: --  Microbiology results: Sputum: ordered    Thank you for allowing pharmacy to be a part of this patient's care.   Lindell Spar, PharmD, BCPS Pager: [phone] 05/20/2016 1:18 PM

## 2016-05-20 NOTE — Progress Notes (Signed)
Inpatient Diabetes Program Recommendations  AACE/ADA: New Consensus Statement on Inpatient Glycemic Control (2015)  Target Ranges:  Prepandial:   less than 140 mg/dL      Peak postprandial:   less than 180 mg/dL (1-2 hours)      Critically ill patients:  140 - 180 mg/dL   Lab Results  Component Value Date   GLUCAP 130 (H) 05/14/2016   HGBA1C 9.0 (H) 05/13/2016    Review of Glycemic Control  Diabetes history: DM2 Outpatient Diabetes medications: Lantus 10 units QHS, Humalog 3 units tidwc Current orders for Inpatient glycemic control: None  Inpatient Diabetes Program Recommendations:    Add Lantus 10 units QHS Novolog 0-9 units tidwc and hs Add Novolog 3 units tidwc if pt eats > 50% meal Change diet to CHO mod medium  Will continue to follow.  Thank you. Lorenda Peck, RD, LDN, CDE Inpatient Diabetes Coordinator [phone]

## 2016-05-20 NOTE — Progress Notes (Signed)
Subjective:    Patient ID: Brenda [name], female    DOB: 15-Sep-1950, 65 y.o.   MRN: [id number]  Synopsis: Severe COPD, still actively smoking as of June 2017, Also with dysphagia and recurrent aspiration leading to recurrent episodes of pneumonia. She also has coronary artery disease and underwent two-vessel coronary intervention in 2017. Simple spirometry FEV1 June 2016 0.67 L (27% predicted) with clear airflow obstruction  HPI Chief Complaint  Patient presents with  . Follow-up    pt hospitalized last week for PNA- pt currently c/o chest tightness, fatigue, weakness, sinus congestion, prod cough with gray mucus.     [name] was doing better when she was discharged from the hospital last week.  She says she has started to worsen with increasing weakness a few days after the hospital discharge.  She says that she feels some chest pain and they are consider an outpatient heart catheterization.  She notes some ankle swelling lately.    She was told that she was aspirating again.  She continues to struggle with food getting stuck in her throat.  She continues to follow with Dr. Benson Norway for this.    She is coughing up more mucus and wheezing.    Past Medical History:  Diagnosis Date  . Arthritis    "hands" (11/26/2012)  . CAD S/P percutaneous coronary angioplasty 1999; 2001;2003, 10/2014   a) BMS- mCx; b) re-do PCI for prog of Dz (NIR BMS 3.5 x 12); c) staged PCI: p-mRCA Express II BMS 3.0x24 & 3.5 x 16 --> p-mLAD Cyper DES 3.0 x 13 & PTCA of D2 (2.5 balloon); 10/2015 PCI p-m LAD Promus DES 2.75 x 38 (covers pre&post-stent ISR), POBA of ostial rPDA  . Cancer of base of tongue ([place]) 2010   "& lymph nodes @ right neck" (11/26/2012  . Carotid artery occlusion   . Chronic bronchitis ([place])   . COPD (chronic obstructive pulmonary disease) with emphysema ([place])   . Depression   . GERD (gastroesophageal reflux disease)   . H/O hiatal hernia   . History of blood transfusion   . History of DVT (deep  vein thrombosis)   . Hx of radiation therapy   . Hx of radiation therapy 01/16/09 - 03/06/09   base of tongue, right neck node  . Hypercholesteremia   . Myocardial infarction 1999, 2003  . PAD (peripheral artery disease) ([name])    status post bilateral femoropopliteal bypass grafting --> Dr. Sherren Mocha Early; -  02/2013 Revision of R Fem-AKPop to BK Pop.  . Patent foramen ovale February 2013   Small. Small PFO noted on TTE for TIA/CVA R-L + Bubble study with valsalva  . Pneumonia   . Stroke [facility])    left side weakness remains (11/26/2012)  . Transient ischemic attack (TIA) February 2013  . Type II diabetes mellitus ([place])       Review of Systems  Constitutional: Positive for chills. Negative for fatigue and fever.  HENT: Negative for postnasal drip, rhinorrhea and sinus pressure.   Respiratory: Positive for cough, shortness of breath and wheezing.   Cardiovascular: Negative for chest pain, palpitations and leg swelling.       Objective:   Physical Exam Vitals:   05/20/16 1022  BP: (!) 126/58  Pulse: 90  Temp: 97.9 F (36.6 C)  TempSrc: Oral  SpO2: 92%  Weight: 128 lb (58.1 kg)  Height: '5\' 5"'$  (1.651 m)   3 L [place]  Gen: chronically ill appearing, kyphosis HENT: OP clear, TM's clear,  neck supple PULM: Wheezing bilaterally, some crackles right base, poor movement CV: RRR, no mgr, trace edema GI: BS+, soft, nontender Psyche: normal mood and affect  Records from her hospitalization from December 2017 reviewed where she had a CT scan that showed multifocal opacities in the right lower lobe right middle lobe and left lower lobe. She was treated for healthcare associated pneumonia with IV antibiotics.  December 2017 chest CT images personally reviewed showing no pulmonary embolism but severe emphysema bilaterally with consolidation in the right middle lobe and right lower lobe. There is also a spiculated opacity in the left lower lobe, I question whether or not this is  consolidation.   05/2016 DG esophagus showed presbyesophagus and difficulty for the 26m tab to pass felt to be due to motility and not a stricture Assessment & Plan:  Impression: Aspiration pneumonia Acute on chronic respiratory failure with hypoxemia COPD exacerbation Severe COPD Spiculated pulmonary nodule left lung  Discussion: RAbigalehas very severe emphysema with chronic respiratory failure with hypoxemia and today in clinic she looks horrible. Her lung sounds severely congested with significant wheezing despite being on an antibiotic up until today. I do not believe that just adding oral prednisone and extending a course of antibiotics will make her better as she has worsened with oral antibiotics at home over the last several days.  Overall her condition is severe and she may be approaching end-stage disease.  For her COPD exacerbation: IV Solu-Medrol 60 every 12 hours Continue Breo daily Albuterol as needed  For her aspiration pneumonia: Treat with IV cefepime Chest x-ray CBC  For her spiculated pulmonary nodule: Will need repeat CT chest in February 2018  For her acute on chronic respiratory failure with hypoxemia: Treat with nasal cannula oxygen to maintain O2 saturation greater than 80%  [facility] [place] [facility] [place] Pager: [phone]: [phone] 3pm or if no response, call [phone]     Current Outpatient Prescriptions:  .  acetaminophen (TYLENOL) 325 MG tablet, Take 2 tablets (650 mg total) by mouth every 6 (six) hours as needed., Disp: 60 tablet, Rfl: 0 .  albuterol (PROVENTIL HFA;VENTOLIN HFA) 108 (90 BASE) MCG/ACT inhaler, Inhale 2 puffs into the lungs every 6 (six) hours as needed for wheezing or shortness of breath., Disp: 1 Inhaler, Rfl: 0 .  aspirin EC 81 MG tablet, Take 1 tablet (81 mg total) by mouth daily., Disp: 30 tablet, Rfl: 10 .  bisoprolol (ZEBETA) 5 MG  tablet, Take 0.5 tablets (2.5 mg total) by mouth daily., Disp: 30 tablet, Rfl: 1 .  buPROPion (WELLBUTRIN XL) 300 MG 24 hr tablet, Take 300 mg by mouth daily. , Disp: , Rfl:  .  clopidogrel (PLAVIX) 75 MG tablet, Take 1 tablet (75 mg total) by mouth daily., Disp: 30 tablet, Rfl: 12 .  cyclobenzaprine (FLEXERIL) 5 MG tablet, Take 1-2 tablets (5-10 mg total) by mouth 3 (three) times daily as needed for muscle spasms., Disp: 30 tablet, Rfl: 3 .  docusate sodium (COLACE) 100 MG capsule, Take 100 mg by mouth daily as needed for mild constipation. , Disp: , Rfl:  .  feeding supplement, GLUCERNA SHAKE, (GLUCERNA SHAKE) LIQD, Take 237 mLs by mouth 2 (two) times daily between meals., Disp: , Rfl: 0 .  fish oil-omega-3 fatty acids 1000 MG capsule, Take 1 g by mouth 2 (two) times daily. , Disp: , Rfl:  .  fluticasone (FLONASE) 50  MCG/ACT nasal spray, Place 2 sprays into both nostrils daily., Disp: 16 g, Rfl: 2 .  fluticasone furoate-vilanterol (BREO ELLIPTA) 100-25 MCG/INH AEPB, Inhale 1 puff into the lungs daily., Disp: 60 each, Rfl: 5 .  guaiFENesin (MUCINEX) 600 MG 12 hr tablet, Take 1,200 mg by mouth 2 (two) times daily., Disp: , Rfl:  .  insulin glargine (LANTUS) 100 UNIT/ML injection, Inject 0.1 mLs (10 Units total) into the skin at bedtime., Disp: 10 mL, Rfl: 0 .  insulin lispro (HUMALOG) 100 UNIT/ML injection, Inject 3 Units into the skin 3 (three) times daily with meals as needed for high blood sugar. , Disp: , Rfl:  .  ipratropium-albuterol (DUONEB) 0.5-2.5 (3) MG/3ML SOLN, Take 3 mLs by nebulization every 6 (six) hours. (Patient taking differently: Take 3 mLs by nebulization 2 (two) times daily as needed (for wheezingt or shortness of breath). ), Disp: 360 mL, Rfl: 3 .  loratadine (CLARITIN) 10 MG tablet, Take 1 tablet (10 mg total) by mouth daily., Disp: 30 tablet, Rfl: 0 .  Maltodextrin-Xanthan Gum (RESOURCE THICKENUP CLEAR) POWD, Use with thin liquids to make nectar thickened consistency, Disp: 125  g, Rfl: 0 .  Multiple Vitamins-Minerals (MULTIVITAMIN & MINERAL PO), Take 1 tablet by mouth daily., Disp: , Rfl:  .  neomycin-bacitracin-polymyxin (NEOSPORIN) ointment, Apply 1 application topically as needed. For open wound , Disp: , Rfl:  .  nitroGLYCERIN (NITROLINGUAL) 0.4 MG/SPRAY spray, Place 1 spray under the tongue every 5 (five) minutes as needed for chest pain., Disp: 12 g, Rfl: 0 .  omeprazole (PRILOSEC) 20 MG capsule, Take 20 mg by mouth daily. , Disp: , Rfl:  .  polyethylene glycol powder (GLYCOLAX/MIRALAX) powder, Take 17 g by mouth 2 (two) times daily., Disp: 255 g, Rfl: 0 .  rosuvastatin (CRESTOR) 20 MG tablet, Take 20 mg by mouth daily., Disp: , Rfl:  .  SPIRIVA RESPIMAT 2.5 MCG/ACT AERS, 2 puffs daily., Disp: , Rfl:  .  traMADol (ULTRAM) 50 MG tablet, Take 1 tablet (50 mg total) by mouth every 6 (six) hours as needed for moderate pain or severe pain., Disp: 30 tablet, Rfl: 0q

## 2016-05-21 ENCOUNTER — Encounter (HOSPITAL_COMMUNITY): Payer: Medicare Other

## 2016-05-21 ENCOUNTER — Ambulatory Visit: Payer: Medicare Other | Admitting: Vascular Surgery

## 2016-05-21 ENCOUNTER — Ambulatory Visit: Payer: Medicare Other | Admitting: Cardiology

## 2016-05-21 ENCOUNTER — Encounter: Payer: Self-pay | Admitting: Pulmonary Disease

## 2016-05-21 DIAGNOSIS — J441 Chronic obstructive pulmonary disease with (acute) exacerbation: Secondary | ICD-10-CM

## 2016-05-21 DIAGNOSIS — R739 Hyperglycemia, unspecified: Secondary | ICD-10-CM

## 2016-05-21 DIAGNOSIS — J9621 Acute and chronic respiratory failure with hypoxia: Secondary | ICD-10-CM

## 2016-05-21 LAB — BASIC METABOLIC PANEL
ANION GAP: 7 (ref 5–15)
Anion gap: 7 (ref 5–15)
BUN: 19 mg/dL (ref 6–20)
BUN: 17 mg/dL (ref 6–20)
CHLORIDE: 99 mmol/L — AB (ref 101–111)
CHLORIDE: 93 mmol/L — AB (ref 101–111)
CO2: 27 mmol/L (ref 22–32)
CO2: 30 mmol/L (ref 22–32)
Calcium: 8.3 mg/dL — ABNORMAL LOW (ref 8.9–10.3)
Calcium: 8.4 mg/dL — ABNORMAL LOW (ref 8.9–10.3)
Creatinine, Ser: 0.61 mg/dL (ref 0.44–1.00)
Creatinine, Ser: 0.76 mg/dL (ref 0.44–1.00)
GFR calc Af Amer: >60 mL/min (ref >60)
GFR calc non Af Amer: >60 mL/min (ref >60)
GFR calc non Af Amer: >60 mL/min (ref >60)
GLUCOSE: 190 mg/dL — AB (ref 65–99)
Glucose, Bld: 424 mg/dL — ABNORMAL HIGH (ref 65–99)
POTASSIUM: 3.9 mmol/L (ref 3.5–5.1)
POTASSIUM: 4.4 mmol/L (ref 3.5–5.1)
Sodium: 133 mmol/L — ABNORMAL LOW (ref 135–145)
Sodium: 130 mmol/L — ABNORMAL LOW (ref 135–145)

## 2016-05-21 LAB — CBC
HCT: 29.3 % — ABNORMAL LOW (ref 36.0–46.0)
HEMATOCRIT: 31.3 % — AB (ref 36.0–46.0)
HEMOGLOBIN: 9.6 g/dL — AB (ref 12.0–15.0)
HEMOGLOBIN: 10.3 g/dL — AB (ref 12.0–15.0)
MCH: 28.6 pg (ref 26.0–34.0)
MCH: 29.7 pg (ref 26.0–34.0)
MCHC: 32.8 g/dL (ref 30.0–36.0)
MCHC: 32.9 g/dL (ref 30.0–36.0)
MCV: 87.2 fL (ref 78.0–100.0)
MCV: 90.2 fL (ref 78.0–100.0)
Platelets: 199 10*3/uL (ref 150–400)
Platelets: 208 10*3/uL (ref 150–400)
RBC: 3.36 MIL/uL — AB (ref 3.87–5.11)
RBC: 3.47 MIL/uL — ABNORMAL LOW (ref 3.87–5.11)
RDW: 15.4 % (ref 11.5–15.5)
RDW: 15.5 % (ref 11.5–15.5)
WBC: 8.7 10*3/uL (ref 4.0–10.5)
WBC: 8.1 10*3/uL (ref 4.0–10.5)

## 2016-05-21 LAB — GLUCOSE, CAPILLARY
GLUCOSE-CAPILLARY: 156 mg/dL — AB (ref 65–99)
GLUCOSE-CAPILLARY: 240 mg/dL — AB (ref 65–99)
GLUCOSE-CAPILLARY: 404 mg/dL — AB (ref 65–99)
Glucose-Capillary: 192 mg/dL — ABNORMAL HIGH (ref 65–99)
Glucose-Capillary: 319 mg/dL — ABNORMAL HIGH (ref 65–99)
Glucose-Capillary: 343 mg/dL — ABNORMAL HIGH (ref 65–99)

## 2016-05-21 LAB — EXPECTORATED SPUTUM ASSESSMENT W REFEX TO RESP CULTURE

## 2016-05-21 LAB — STREP PNEUMONIAE URINARY ANTIGEN: Strep Pneumo Urinary Antigen: NEGATIVE

## 2016-05-21 LAB — EXPECTORATED SPUTUM ASSESSMENT W GRAM STAIN, RFLX TO RESP C: Special Requests: NORMAL

## 2016-05-21 MED ORDER — INSULIN ASPART 100 UNIT/ML ~~LOC~~ SOLN
0.0000 [IU] | Freq: Three times a day (TID) | SUBCUTANEOUS | Status: DC
  Administered 2016-05-21: 15 [IU] via SUBCUTANEOUS
  Administered 2016-05-21: 20 [IU] via SUBCUTANEOUS
  Administered 2016-05-22: 7 [IU] via SUBCUTANEOUS
  Administered 2016-05-22: 11 [IU] via SUBCUTANEOUS
  Administered 2016-05-22 – 2016-05-23 (×2): 3 [IU] via SUBCUTANEOUS
  Administered 2016-05-23: 15 [IU] via SUBCUTANEOUS
  Administered 2016-05-23: 4 [IU] via SUBCUTANEOUS

## 2016-05-21 MED ORDER — INSULIN ASPART 100 UNIT/ML ~~LOC~~ SOLN
0.0000 [IU] | Freq: Every day | SUBCUTANEOUS | Status: DC
  Administered 2016-05-21 – 2016-05-22 (×2): 4 [IU] via SUBCUTANEOUS
  Administered 2016-05-23: 5 [IU] via SUBCUTANEOUS

## 2016-05-21 MED ORDER — IPRATROPIUM-ALBUTEROL 0.5-2.5 (3) MG/3ML IN SOLN
3.0000 mL | Freq: Four times a day (QID) | RESPIRATORY_TRACT | Status: DC
  Administered 2016-05-21 – 2016-05-24 (×10): 3 mL via RESPIRATORY_TRACT
  Filled 2016-05-21 (×10): qty 3

## 2016-05-21 NOTE — Progress Notes (Signed)
CBG 404. Paged MD. MD stated to cover with sliding scale and stat BMET to confirm.

## 2016-05-21 NOTE — Progress Notes (Signed)
eLink Physician-Brief Progress Note Patient Name: Brenda [name] DOB: 1950-10-17 MRN: [id number]   Date of Service  05/21/2016  HPI/Events of Note  BG=404  eICU Interventions  Follow SSI, 20units novolog, check BG.      Intervention Category Major Interventions: Hyperglycemia - active titration of insulin therapy  Lindsi Bayliss 05/21/2016, 5:21 PM

## 2016-05-21 NOTE — Progress Notes (Signed)
Nutrition Brief Note  Patient identified on the Malnutrition Screening Tool (MST) Report (2.0 score)  Wt Readings from Last 15 Encounters:  05/20/16 128 lb (58.1 kg)  05/20/16 128 lb (58.1 kg)  05/12/16 140 lb 14 oz (63.9 kg)  05/06/16 127 lb 3.2 oz (57.7 kg)  05/01/16 124 lb (56.2 kg)  04/15/16 127 lb (57.6 kg)  04/03/16 120 lb (54.4 kg)  04/03/16 120 lb 3.2 oz (54.5 kg)  03/27/16 125 lb 6.4 oz (56.9 kg)  02/13/16 124 lb (56.2 kg)  01/30/16 125 lb 11.2 oz (57 kg)  01/16/16 126 lb 6.4 oz (57.3 kg)  12/22/15 123 lb (55.8 kg)  12/19/15 123 lb (55.8 kg)  12/01/15 121 lb (54.9 kg)    Body mass index is 21.3 kg/m. Patient meets criteria for normal weight based on current BMI.   Current diet order is Carb Modified/Dysphagia 3 with nectar-thick liquids (Resource ThickenUp already ordered), patient is consuming 75-90% of meals at this time. Pt with severe COPD and chronic dysphagia with recurrent aspiration PNA. Pt being followed by PCCM and is followed by [facility] on an outpatient basis. Dr. Ammie Dalton note from earlier today states plan to d/c D5 and he feels hyperglycemia is being exacerbated by this and steroid order.  Medications reviewed; sliding scale Novolog, 10 units Lantus/day, 60 mg IV Solu-medrol BID. Labs reviewed; CBGs: 156-319 mg/dL today, Na: 133 mmol/L, K: WDL, Cl: 99 mmol/L, Ca: 8.3 mg/dL, Alk Phos elevated.   No nutrition interventions warranted at this time. If nutrition issues arise, please consult RD.     Jarome Matin, MS, RD, LDN, [facility] Inpatient Clinical Dietitian Pager # [phone] After hours/weekend pager # [phone]

## 2016-05-21 NOTE — Progress Notes (Signed)
Name: Brenda [name] MRN: [id number] DOB: 1951-01-09    ADMISSION DATE:  05/20/2016  CHIEF COMPLAINT:  Worsening Shortness of Breath  BRIEF PATIENT DESCRIPTION: 65 y.o. female [name] with Severe COPD, still actively smoking as of June 2017, Also with dysphagia and recurrent aspiration leading to recurrent episodes of pneumonia. She also has coronary artery disease and underwent two-vessel coronary intervention in 2017. Simple spirometry FEV1 June 2016 0.67 L (27% predicted) with clear airflow obstruction.  SIGNIFICANT EVENTS  12/07 - 12/12 - Admit w/ acute resp failure & aspiration pneumonia w/ COPD Exacerbation 12/18 - Admit from Office by BQ  STUDIES:  Port CXR 12/18:  Personally reviewed by me. Right mid and lower lung patchy opacification without significant change compared with previous chest x-ray from 12/7. No obvious pleural effusion.   SUBJECTIVE:  Patient reports she has continued coughing producing any purulent mucus. Dyspnea is improving. Denies any chest pain or pressure. Did sleep well last night.  REVIEW OF SYSTEMS:  No subjective fever or chills. No abdominal pain or nausea. No headache or vision changes.  VITAL SIGNS: Temp:  [97.6 F (36.4 C)-98.4 F (36.9 C)] 97.6 F (36.4 C) (12/19 0359) Pulse Rate:  [70-90] 70 (12/19 0359) Resp:  [16-18] 17 (12/19 0359) BP: (89-126)/(55-73) 116/66 (12/19 0359) SpO2:  [92 %-100 %] 100 % (12/19 0359) Weight:  [128 lb (58.1 kg)] 128 lb (58.1 kg) (12/18 1500)  PHYSICAL EXAMINATION: General:  Awake. Alert. No acute distress. No family at bedside.  Integument:  Warm & dry. No rash on exposed skin.  HEENT:  Moist mucus membranes. No oral ulcers. No scleral injection or icterus. Cardiovascular:  Regular rate. No edema. No appreciable JVD.  Pulmonary:  Coarse breath sounds on the right with mild wheeze. Normal work of breathing on room air. Abdomen: Soft. Normal bowel sounds. Nondistended. Grossly nontender. Musculoskeletal:   Normal bulk and tone. No joint deformity or effusion appreciated.   Recent Labs Lab 05/20/16 1319 05/21/16 0547  NA 133* 133*  K 3.8 3.9  CL 96* 99*  CO2 30 27  BUN 22* 19  CREATININE 0.91 0.61  GLUCOSE 235* 190*    Recent Labs Lab 05/20/16 1319 05/21/16 0547  HGB 10.0* 9.6*  HCT 30.2* 29.3*  WBC 14.3* 8.7  PLT 211 199   Dg Chest Port 1 View  Result Date: 05/20/2016 CLINICAL DATA:  65 year old female with shortness of breath cough and congestion. Initial encounter. Former smoker. EXAM: PORTABLE CHEST 1 VIEW COMPARISON:  Chest CTA 05/09/2016 and earlier. FINDINGS: Multilobar right side pneumonia with mild progression of right lower lobe region reticulonodular density since 05/09/2016. Stable ventilation elsewhere. Emphysema. Stable cardiac size and mediastinal contours. No definite pleural effusion. Partially visible oral contrast in the upper abdomen. Visualized tracheal air column is within normal limits. IMPRESSION: 1. Multilobar pneumonia with no improvement and mild radiographic progression in the right mid lung since 05/09/2016. No definite pleural effusion. 2. Emphysema. Electronically Signed   By: Genevie Ann M.D.   On: 05/20/2016 13:12    ASSESSMENT / PLAN:  65 y.o. female with known Very severe COPD with emphysema and recurrent aspiration pneumonia. Patient presents again with aspiration pneumonia with purulent sputum production. Some clinical improvement today likely due to steroid euphoria. Switching over to nebulizer therapies in an effort to improve mucociliary clearance and symptom control. Continuing current dose of Solu-Medrol. Continuing empiric IV cefepime while awaiting sputum culture. Switching Accu-Cheks for treatment of hyperglycemia over acute before meals & at bedtime.  Discontinuing dextrose IV fluid.  1. Acute on Chronic Hypoxic Respiratory Failure:  Secondary to recurrent aspiration pneumonia vs pneumonitis. Improving. 2. Right Lung Aspiration Pneumonia:   Continuing Mucinex '1200mg'$  bid & IV Cefepime. Trending Procalcitonin per algorithm. Awaiting sputum culture.  3. Very Severe COPD with Exacerbation:  Holding home Breo & Spiriva. Continuing Solu-Medrol IV q12hr. Starting [employer] while awake. 4. Diabetes Mellitus Type 2:  Hyperglycemia likely due to D5 IVF and steroids. Discontinuing D5 IVF. Continuing Lantus 10u [place] qhs & accu-checks qAC & HS and SSI per Resistant algorithm.  5. Dysphagia w/ Recurrent Aspiration:  Continuing Dysphagia 3 diet as previously recommended. Follows with Dr. Benson Norway. 6. Right Lung Spiculated Nodule:  Plan for repeat CT Chest w/o in February 2018. 7. H/O GERD & Hiatal Hernia:  Continuing PO Protonix. 8. H/O CAD & PAD:  Continuing Crestor '20mg'$  daily, ASA '81mg'$  daily & Plavix '75mg'$  daily. 9. H/O Depression:  Continuing home Wellbutrin XL daily. 10. Prophylaxis:  Continuing Heparin [place] q8hr & Protonix PO daily. 11. Diet:  Diabetic Dysphagia 3 diet.   Sonia Baller Ashok Cordia, M.D. [facility] Pulmonary & Critical Care Pager:  [phone] After 3pm or if no response, call [phone] 05/21/2016, 9:05 AM

## 2016-05-22 ENCOUNTER — Encounter: Payer: Self-pay | Admitting: *Deleted

## 2016-05-22 ENCOUNTER — Ambulatory Visit: Payer: Medicare Other | Admitting: Cardiology

## 2016-05-22 LAB — GLUCOSE, CAPILLARY
GLUCOSE-CAPILLARY: 267 mg/dL — AB (ref 65–99)
GLUCOSE-CAPILLARY: 241 mg/dL — AB (ref 65–99)
Glucose-Capillary: 262 mg/dL — ABNORMAL HIGH (ref 65–99)
Glucose-Capillary: 136 mg/dL — ABNORMAL HIGH (ref 65–99)
Glucose-Capillary: 340 mg/dL — ABNORMAL HIGH (ref 65–99)

## 2016-05-22 MED ORDER — INSULIN NPH (HUMAN) (ISOPHANE) 100 UNIT/ML ~~LOC~~ SUSP
10.0000 [IU] | Freq: Once | SUBCUTANEOUS | Status: AC
  Administered 2016-05-22: 10 [IU] via SUBCUTANEOUS
  Filled 2016-05-22: qty 10

## 2016-05-22 MED ORDER — PREDNISONE 50 MG PO TABS
60.0000 mg | ORAL_TABLET | Freq: Every day | ORAL | Status: DC
  Administered 2016-05-23 – 2016-05-24 (×2): 60 mg via ORAL
  Filled 2016-05-22 (×2): qty 1

## 2016-05-22 MED ORDER — INSULIN GLARGINE 100 UNIT/ML ~~LOC~~ SOLN
20.0000 [IU] | Freq: Every day | SUBCUTANEOUS | Status: DC
  Administered 2016-05-22 – 2016-05-23 (×2): 20 [IU] via SUBCUTANEOUS
  Filled 2016-05-22 (×2): qty 0.2

## 2016-05-22 MED ORDER — GLUCERNA SHAKE PO LIQD
237.0000 mL | Freq: Two times a day (BID) | ORAL | Status: DC
  Administered 2016-05-23 – 2016-05-24 (×3): 237 mL via ORAL
  Filled 2016-05-22 (×4): qty 237

## 2016-05-22 NOTE — Progress Notes (Deleted)
PCP: [name], MD  Clinic Note: No chief complaint on file.   HPI: SHANINA KEPPLE is a 65 y.o. female with a PMH below who presents today for post hospital follow-up.  CANDIACE WEST was last seen earlier this month in December prior to her hospitalization  Recent Hospitalizations:   November 1-7: Admitted for COPD exacerbation with pneumonia and sepsis  November 29, ER visit for compression fracture  December 7-12: Admitted for sepsis with aspiration pneumonia. Placed on thickened liquid diet.  Was complicated by some chest pain with positive troponins thought to be related to demand ischemia.  Plan was for outpatient Myoview once she is stable post-hospitalization  Studies Reviewed: No new studies  Interval History: ***   No chest pain or shortness of breath with rest or exertion. No PND, orthopnea or edema. No palpitations, lightheadedness, dizziness, weakness or syncope/near syncope. No TIA/amaurosis fugax symptoms. No melena, hematochezia, hematuria, or epstaxis. No claudication.  ROS: A comprehensive was performed. ROS   Past Medical History:  Diagnosis Date  . Arthritis    "hands" (11/26/2012)  . CAD S/P percutaneous coronary angioplasty 1999; 2001;2003, 10/2014   a) BMS- mCx; b) re-do PCI for prog of Dz (NIR BMS 3.5 x 12); c) staged PCI: p-mRCA Express II BMS 3.0x24 & 3.5 x 16 --> p-mLAD Cyper DES 3.0 x 13 & PTCA of D2 (2.5 balloon); 10/2015 PCI p-m LAD Promus DES 2.75 x 38 (covers pre&post-stent ISR), POBA of ostial rPDA  . Cancer of base of tongue ([place]) 2010   "& lymph nodes @ right neck" (11/26/2012  . Carotid artery occlusion   . Chronic bronchitis ([place])   . COPD (chronic obstructive pulmonary disease) with emphysema ([place])   . Depression   . GERD (gastroesophageal reflux disease)   . H/O hiatal hernia   . History of blood transfusion   . History of DVT (deep vein thrombosis)   . Hx of radiation therapy   . Hx of radiation therapy 01/16/09 -  03/06/09   base of tongue, right neck node  . Hypercholesteremia   . Myocardial infarction 1999, 2003  . PAD (peripheral artery disease) ([name])    status post bilateral femoropopliteal bypass grafting --> Dr. Sherren Mocha Early; -  02/2013 Revision of R Fem-AKPop to BK Pop.  . Patent foramen ovale February 2013   Small. Small PFO noted on TTE for TIA/CVA R-L + Bubble study with valsalva  . Pneumonia   . Stroke [facility])    left side weakness remains (11/26/2012)  . Transient ischemic attack (TIA) February 2013  . Type II diabetes mellitus ([place])     Past Surgical History:  Procedure Laterality Date  . ANKLE FRACTURE SURGERY Right 2007   "got a metal plate and 8 screws in it" (11/26/2012)  . APPENDECTOMY  1962  . BYPASS GRAFT POPLITEAL TO POPLITEAL Right 02/05/2013   Procedure: BYPASS GRAFT ABOVE KNEE POPLITEAL TO BELOW KNEE POPLITEAL WITH SMALL SAPHANOUS VEIN;  Surgeon: Rosetta Posner, MD;  Location: [facility];  Service: Vascular;  Laterality: Right;  . CARDIAC CATHETERIZATION  1999; 10/2005   5/'07: mildLAD 20-30%, patent stent, diags- no Dz. RCA 2 BMS patent ~20% ISR.   Marland Kitchen CARDIAC CATHETERIZATION N/A 10/06/2015   Procedure: Left Heart Cath and Coronary Angiography;  Surgeon: Leonie Man, MD;  Location: [place] CV [facility];  Service: Cardiovascular; p-mLAD 99% pre-stent, 60% ISR & 80% post-stent, o-RPDA 90%, ~10% ISR in 2 RCA (Prox & mid) BMS,~20% ISR in pCx  BMS with 5% OM2 BMS.    Marland Kitchen CARDIAC CATHETERIZATION N/A 10/06/2015   Procedure: Coronary Stent Intervention;  Surgeon: Leonie Man, MD;  Location: [place] CV LAB;  Service: Cardiovascular; p-m LAD Promus DES 2.75 x 38 (tapered post-dilatoin)  . CARDIAC CATHETERIZATION N/A 10/06/2015   Procedure: Coronary Balloon Angioplasty;  Surgeon: Leonie Man, MD;  Location: [place] [facility];  Service: Cardiovascular;  POBA of [place] rPDA.  Marland Kitchen CARPAL TUNNEL RELEASE Right 1980's?  Marland Kitchen CATARACT EXTRACTION W/ INTRAOCULAR LENS IMPLANT Right 2011  . [place]  . CORONARY ANGIOPLASTY WITH STENT PLACEMENT  1999--2005   1999 - OM2 BMS; 2001: p-mCx NIR BMS 3.5 x 12; 10/2001: p-mRCA Express II BMS - 3.5 x 16 - 3.0 x 24.   . ESOPHAGOGASTRODUODENOSCOPY  12/20/2011   Procedure: ESOPHAGOGASTRODUODENOSCOPY (EGD);  Surgeon: Beryle Beams, MD;  Location: [facility];  Service: Endoscopy;  Laterality: N/A;  EGD with balloon dilation  . EYE SURGERY    . FEMORAL BYPASS  1999; 2000   Bilateral Fem-AK POP bypass.   Marland Kitchen PERIPHERAL VASCULAR CATHETERIZATION N/A 12/01/2015   Procedure: Abdominal Aortogram w/Lower Extremity;  Surgeon: Elam Dutch, MD;  Location: [place] [facility];  Service: Cardiovascular;  Laterality: N/A;  . PERIPHERAL VASCULAR CATHETERIZATION Right 12/22/2015   Procedure: Peripheral Vascular Intervention;  Surgeon: Rosetta Posner, MD;  Location: [place] [facility];  Service: Cardiovascular;  Laterality: Right;  EXternal iliac  . SAVORY DILATION  12/20/2011   Procedure: SAVORY DILATION;  Surgeon: Beryle Beams, MD;  Location: [facility];  Service: Endoscopy;  Laterality: N/A;  . TEE WITHOUT CARDIOVERSION  07/26/2011   Procedure: TRANSESOPHAGEAL ECHOCARDIOGRAM (TEE);  Surgeon: Pixie Casino, MD;  Location: [facility]OSCOPY;  Service: Cardiovascular;  Laterality: N/A;  . TONSILLECTOMY  1969  . TRANSTHORACIC ECHOCARDIOGRAM  March 2016   Normal LV size and function EF 55-60%. No regional wall motion and amount is. No significant valvular lesions.  . TUBAL LIGATION  1988  . VAGINAL HYSTERECTOMY  1988    Cath 10/2015:   Prox LAD to Mid LAD lesion, 99% stenosed. Mid LAD Cypher DES stent (2003), 60% stenosed with 80% stenosis distal to stent. Post intervention - Promus DES 2.75 x 38), there is a 0% residual stenosis. The lesion was previously treated with a drug-eluting stent greater than two years ago.  Ost RPDA lesion, 90% stenosed. Post POBA Only intervention, there is a 15% residual stenosis.     Abdominal aortogram and peripheral  vascular intervention - Dr. Donnetta Hutching - July 2017  Operative findings: #1 exophytic calcific stenosis right external iliac artery #2 patent right femoral to above-knee popliteal bypass #3 occluded jump graft from above-knee popliteal to below-knee popliteal right leg #4 patent left femoral to below-knee popliteal bypass  7/21: R Ext Iliac Angioplasty & Stent  No outpatient prescriptions have been marked as taking for the 05/22/16 encounter (Appointment) with Leonie Man, MD.    Allergies  Allergen Reactions  . Tape Hives and Other (See Comments)    USE PAPER TAPE ONLY- Adhesive peels off skin-makes pt. Raw.  . Isosorbide Other (See Comments)    Drops BP too low  . Nicoderm [Nicotine] Rash    To the [facility] only, breakouts on skin    Social History   Social History  . Marital status: Married    Spouse name: N/A  . Number of children: N/A  . Years of education: N/A   Occupational History  .  house wife    Social History Main Topics  . Smoking status: Former Smoker    Packs/day: 1.00    Years: 50.00    Types: Cigarettes    Quit date: 04/03/2016  . Smokeless tobacco: Never Used     Comment: up to 0.5 ppd 02/13/16  . Alcohol use Yes     Comment: occ  . Drug use: No  . Sexual activity: No   Other Topics Concern  . Not on file   Social History Narrative  . No narrative on file    family history includes Cancer in her mother and sister; Cancer (age of onset: 64) in her father; Clotting disorder in her brother; Deep vein thrombosis in her brother; Diabetes in her brother, mother, sister, and son; Hearing loss in her brother; Heart attack in her brother and father; Heart disease in her father; Hyperlipidemia in her brother, mother, and son; Hypertension in her brother and mother; Other in her mother.  Wt Readings from Last 3 Encounters:  05/22/16 60.9 kg (134 lb 4.2 oz)  05/20/16 58.1 kg (128 lb)  05/12/16 63.9 kg (140 lb 14 oz)    PHYSICAL EXAM LMP  (LMP Unknown)    General appearance: alert, cooperative, appears stated age, no distress and *** obese Neck: no adenopathy, no carotid bruit and no JVD Lungs: clear to auscultation bilaterally, normal percussion bilaterally and non-labored Heart: regular rate and rhythm, S1, S2 normal, no murmur, click, rub or gallop *** Abdomen: soft, non-tender; bowel sounds normal; no masses,  no organomegaly; *** Extremities: extremities normal, atraumatic, no cyanosis, and edema *** Pulses: 2+ and symmetric; *** Skin: {normal findings:33173} or {abnormal findings:33192} Neurologic: Mental status: Alert, oriented, thought content appropriate Cranial nerves: normal (II-XII grossly intact)    Adult ECG Report  Rate: *** ;  Rhythm: {rhythm:17366};   Narrative Interpretation: ***   Other studies Reviewed: Additional studies/ records that were reviewed today include:  Recent Labs:  ***     ASSESSMENT / PLAN: Problem List Items Addressed This Visit    CAD: Stents in p-mRCA, p & m Cx, p-mLAD - Primary (Chronic)   Hyperlipidemia with target LDL less than 70 (Chronic)   Claudication in peripheral vascular disease ([name]) (Chronic)   Tobacco abuse (Chronic)   Protein-calorie malnutrition, moderate ([name]) (Chronic)   Angina pectoris ([place])      Current medicines are reviewed at length with the patient today. (+/- concerns) *** The following changes have been made: ***  There are no Patient Instructions on file for this visit.  Studies Ordered:   No orders of the defined types were placed in this encounter.     Glenetta Hew, M.D., M.S. Interventional Cardiologist   Pager # [phone] Phone # [phone] [address] [place], [name] [address]

## 2016-05-22 NOTE — Progress Notes (Signed)
Name: Brenda [name] MRN: [id number] DOB: 06/18/1950    ADMISSION DATE:  05/20/2016  CHIEF COMPLAINT:  Worsening Shortness of Breath  BRIEF PATIENT DESCRIPTION: 65 y.o. female [name] with Severe COPD, still actively smoking as of June 2017, Also with dysphagia and recurrent aspiration leading to recurrent episodes of pneumonia. She also has coronary artery disease and underwent two-vessel coronary intervention in 2017. Simple spirometry FEV1 June 2016 0.67 L (27% predicted) with clear airflow obstruction.  SIGNIFICANT EVENTS  12/07 - 12/12 - Admit w/ acute resp failure & aspiration pneumonia w/ COPD Exacerbation 12/18 - Admit from Office by BQ  STUDIES:  Port CXR 12/18:  Personally reviewed by me. Right mid and lower lung patchy opacification without significant change compared with previous chest x-ray from 12/7. No obvious pleural effusion.   MICROBIOLOGY: Sputum Ctx 12/19 >>  ANTIBIOTICS: Cefepime 12/18 >>  SUBJECTIVE:  Patient had continued hyperglycemia overnight and was given an additional 20u of Novolog insulin. Patient denies any chest pain or pressure. Reports dyspnea at rest is improving. Continues to have a purulent cough.  REVIEW OF SYSTEMS:  No fever or chills. No nausea or emesis. Appetite is improving. No headache or vision changes.  VITAL SIGNS: Temp:  [98 F (36.7 C)-98.4 F (36.9 C)] 98.4 F (36.9 C) (12/19 2108) Pulse Rate:  [80-89] 80 (12/19 2108) Resp:  [16-18] 18 (12/19 2108) BP: (146-148)/(71-75) 146/71 (12/19 2108) SpO2:  [96 %-100 %] 97 % (12/20 0903) Weight:  [134 lb 4.2 oz (60.9 kg)-137 lb 5.6 oz (62.3 kg)] 134 lb 4.2 oz (60.9 kg) (12/20 0720)  PHYSICAL EXAMINATION: General:  Awake. No distress. Nephew at bedside.  Integument:  Warm & dry. No rash on exposed skin.  HEENT:  Moist mucus membranes. No oral ulcers. No scleral icterus. Cardiovascular:  Regular rate. No edema. Normal S1 & S2. Pulmonary:  Coarse breath sounds on the right with  resolution of wheezing. Normal work of breathing on supplemental oxygen. Speaking in complete sentences. Abdomen: Soft. Normal bowel sounds. Nontender. Musculoskeletal:  Normal bulk and tone. No joint deformity or effusion appreciated.   Recent Labs Lab 05/20/16 1319 05/21/16 0547 05/21/16 1743  NA 133* 133* 130*  K 3.8 3.9 4.4  CL 96* 99* 93*  CO2 '30 27 30  '$ BUN 22* 19 17  CREATININE 0.91 0.61 0.76  GLUCOSE 235* 190* 424*    Recent Labs Lab 05/20/16 1319 05/21/16 0547 05/21/16 1739  HGB 10.0* 9.6* 10.3*  HCT 30.2* 29.3* 31.3*  WBC 14.3* 8.7 8.1  PLT 211 199 208   Dg Chest Port 1 View  Result Date: 05/20/2016 CLINICAL DATA:  65 year old female with shortness of breath cough and congestion. Initial encounter. Former smoker. EXAM: PORTABLE CHEST 1 VIEW COMPARISON:  Chest CTA 05/09/2016 and earlier. FINDINGS: Multilobar right side pneumonia with mild progression of right lower lobe region reticulonodular density since 05/09/2016. Stable ventilation elsewhere. Emphysema. Stable cardiac size and mediastinal contours. No definite pleural effusion. Partially visible oral contrast in the upper abdomen. Visualized tracheal air column is within normal limits. IMPRESSION: 1. Multilobar pneumonia with no improvement and mild radiographic progression in the right mid lung since 05/09/2016. No definite pleural effusion. 2. Emphysema. Electronically Signed   By: Genevie Ann M.D.   On: 05/20/2016 13:12    ASSESSMENT / PLAN:  65 y.o. female with known Very severe COPD with emphysema and recurrent aspiration pneumonia. Patient presents again with aspiration pneumonia with purulent sputum production. Patient seems to be improving clinically with  regards to her cough and aspiration pneumonia. Attempting to ambulate patient today. Awaiting sputum culture while continuing broad spectrum antibiotics for now. De-escalating steroids to oral starting tomorrow for treatment of her COPD exacerbation. Increasing  patient's subcutaneous insulin for treatment of her hyperglycemia which should improve with decreasing doses of steroids.   1. Acute on Chronic Hypoxic Respiratory Failure:  Secondary to recurrent aspiration pneumonia vs pneumonitis. Improving. 2. Right Lung Aspiration Pneumonia:  Continuing Mucinex '1200mg'$  bid & IV Cefepime. Trending Procalcitonin per algorithm. Awaiting sputum culture result. Continuing empiric Cefepime Day #3.  3. Very Severe COPD with Exacerbation:  Holding home Breo & Spiriva. Switching from Solu-Medrol to Prednisone '60mg'$  daily starting tomorrow. Duoneb q6hr while awake. 4. Diabetes Mellitus Type 2:  Hyperglycemia likely due to steroids. Increasing Lantus to 20u [name] qhs & continuing accu-checks qAC & HS and SSI per Resistant algorithm. NPH 10u [place] x1 this morning. 5. Dysphagia w/ Recurrent Aspiration:  Continuing Dysphagia 3 diet as previously recommended. Follows with Dr. Benson Norway. 6. Right Lung Spiculated Nodule:  Plan for repeat CT Chest w/o in February 2018. 7. Hyponatremia:  Mild. Continuing to trend electrolytes daily.  8. H/O GERD & Hiatal Hernia:  Continuing PO Protonix.  9. H/O CAD & PAD:  Continuing Crestor '20mg'$  daily, ASA '81mg'$  daily & Plavix '75mg'$  daily. 10. H/O Depression:  Continuing home Wellbutrin XL daily. 11. Prophylaxis:  Continuing Heparin  q8hr & Protonix PO daily. 12. Diet:  Diabetic Dysphagia 3 diet.  13. Disposition:  Pending continued clinical improvement and tolerance of steroid weaning patient may be able to discharge in the next 48-72 hours.   Sonia Baller Ashok Cordia, M.D. [facility] Pulmonary & Critical Care Pager:  [phone] After 3pm or if no response, call [phone] 05/22/2016, 9:43 AM

## 2016-05-22 NOTE — Progress Notes (Signed)
Inpatient Diabetes Program Recommendations  AACE/ADA: New Consensus Statement on Inpatient Glycemic Control (2015)  Target Ranges:  Prepandial:   less than 140 mg/dL      Peak postprandial:   less than 180 mg/dL (1-2 hours)      Critically ill patients:  140 - 180 mg/dL   Lab Results  Component Value Date   GLUCAP 241 (H) 05/22/2016   HGBA1C 9.0 (H) 05/13/2016    Review of Glycemic Control  Post-prandial blood sugars elevated. Needs meal coverage insulin. On Humalog 3 units tidwc at home.  Inpatient Diabetes Program Recommendations:   Add Novolog 3 units tidwc for meal coverage insulin.  Continue to follow. Thank you. Lorenda Peck, RD, LDN, CDE Inpatient Diabetes Coordinator [phone]

## 2016-05-23 DIAGNOSIS — J155 Pneumonia due to Escherichia coli: Secondary | ICD-10-CM

## 2016-05-23 LAB — GLUCOSE, CAPILLARY
GLUCOSE-CAPILLARY: 308 mg/dL — AB (ref 65–99)
GLUCOSE-CAPILLARY: 402 mg/dL — AB (ref 65–99)
GLUCOSE-CAPILLARY: 372 mg/dL — AB (ref 65–99)
Glucose-Capillary: 150 mg/dL — ABNORMAL HIGH (ref 65–99)
Glucose-Capillary: 152 mg/dL — ABNORMAL HIGH (ref 65–99)

## 2016-05-23 LAB — CULTURE, RESPIRATORY W GRAM STAIN

## 2016-05-23 LAB — CULTURE, RESPIRATORY

## 2016-05-23 MED ORDER — SODIUM CHLORIDE 0.9 % IV SOLN
1.5000 g | Freq: Four times a day (QID) | INTRAVENOUS | Status: DC
  Administered 2016-05-23 – 2016-05-24 (×4): 1.5 g via INTRAVENOUS
  Filled 2016-05-23 (×5): qty 1.5

## 2016-05-23 NOTE — Progress Notes (Signed)
Name: Brenda [name] MRN: [id number] DOB: [birth year]    ADMISSION DATE:  05/20/2016  CHIEF COMPLAINT:  Worsening Shortness of Breath  BRIEF PATIENT DESCRIPTION: 65 y.o. female [name] with Severe COPD, still actively smoking as of June 2017, Also with dysphagia and recurrent aspiration leading to recurrent episodes of pneumonia. She also has coronary artery disease and underwent two-vessel coronary intervention in 2017. Simple spirometry FEV1 June 2016 0.67 L (27% predicted) with clear airflow obstruction.  SIGNIFICANT EVENTS  12/07 - 12/12 - Admit w/ acute resp failure & aspiration pneumonia w/ COPD Exacerbation 12/18 - Admit from Office by BQ  STUDIES:  Port CXR 12/18:  Previously reviewed by me. Right mid and lower lung patchy opacification without significant change compared with previous chest x-ray from 12/7. No obvious pleural effusion.   MICROBIOLOGY: Sputum Ctx 12/19:  E coli pan sensitive  ANTIBIOTICS: Cefepime 12/18 - 12/21 Unasyn 12/21 >>  SUBJECTIVE:  Cough and dyspnea improving. Patient reports she ambulated in the hall yesterday with low saturation reportedly 88% on room air. Denies any chest pain or pressure.  REVIEW OF SYSTEMS:  Has a good appetite. No abdominal pain or nausea. No subjective fever or chills. No rashes or abnormal bruising.  VITAL SIGNS: Temp:  [98.1 F (36.7 C)-98.6 F (37 C)] 98.1 F (36.7 C) (12/21 0539) Pulse Rate:  [73-88] 73 (12/21 0539) Resp:  [18-20] 20 (12/21 0539) BP: (120-136)/(59-75) 120/59 (12/21 0539) SpO2:  [93 %-100 %] 93 % (12/21 0829) Weight:  [132 lb 15 oz (60.3 kg)] 132 lb 15 oz (60.3 kg) (12/21 0539)  PHYSICAL EXAMINATION: General:  Awake. Comfortable. Watching TV.  Integument:  Warm & dry. No rash on exposed skin.  HEENT:  Moist mucus membranes. No oral ulcers. No scleral icterus. Cardiovascular:  Regular rate. No edema. Normal S1 & S2. Pulmonary:  Improving crackles right lung base. Good aeration bilaterally.  Speaking in complete sentences. Abdomen: Soft. Normal bowel sounds. Nontender. Musculoskeletal:  Normal bulk and tone. No joint deformity or effusion appreciated.   Recent Labs Lab 05/20/16 1319 05/21/16 0547 05/21/16 1743  NA 133* 133* 130*  K 3.8 3.9 4.4  CL 96* 99* 93*  CO2 '30 27 30  '$ BUN 22* 19 17  CREATININE 0.91 0.61 0.76  GLUCOSE 235* 190* 424*    Recent Labs Lab 05/20/16 1319 05/21/16 0547 05/21/16 1739  HGB 10.0* 9.6* 10.3*  HCT 30.2* 29.3* 31.3*  WBC 14.3* 8.7 8.1  PLT 211 199 208   No results found.  ASSESSMENT / PLAN:  65 y.o. female with known Very severe COPD with emphysema and recurrent aspiration pneumonia. Patient presents again with aspiration pneumonia With Escherichia coli on sputum culture. Hypoxic respiratory failure is resolving. Continuing airway clearance. Continuing treatment of her COPD exacerbation with prednisone. Continuing Duonebs for now. Plan to transition patient to home inhalers upon discharge. Glucose seems to improved as well with de-escalation of steroids.  1. Acute on Chronic Hypoxic Respiratory Failure:  Secondary to recurrent aspiration pneumonia vs pneumonitis. Improving/Resolving. 2. Right Lung Aspiration Pneumonia/E coli Pneumonia:  Continuing Mucinex '1200mg'$  bid. Trending Procalcitonin per algorithm. Switching to Unasyn Day #4 of Antibiotics & plan to transition to Augmentin in 24-48 hours if continues to improve.  3. Very Severe COPD with Exacerbation:  Holding home Breo & Spiriva. Prednisone '60mg'$  daily Day #1. Duoneb q6hr while awake. 4. Diabetes Mellitus Type 2:  Hyperglycemia improved. Lantus to 20u [place] qhs & accu-checks qAC & HS and SSI per Resistant algorithm.  5. Dysphagia w/ Recurrent Aspiration:  Continuing Dysphagia 3 diet as previously recommended. Follows with Dr. Benson Norway. 6. Right Lung Spiculated Nodule:  Plan for repeat CT Chest w/o in February 2018. 7. Hyponatremia:  Mild. Repeat electrolytes tomorrow.  8. H/O GERD & Hiatal  Hernia:  Continuing PO Protonix.  9. H/O CAD & PAD:  Continuing Crestor '20mg'$  daily, ASA '81mg'$  daily & Plavix '75mg'$  daily. 10. H/O Depression:  Continuing home Wellbutrin XL daily. 11. Prophylaxis:  Continuing Heparin [place] q8hr & Protonix PO daily. 12. Diet:  Diabetic Dysphagia 3 diet.  13. Disposition:  Pending continued clinical improvement and tolerance of steroid weaning patient may be able to discharge in the next 24 hours.   Sonia Baller Ashok Cordia, M.D. [facility] Pulmonary & Critical Care Pager:  [phone] After 3pm or if no response, call [phone] 05/23/2016, 11:15 AM

## 2016-05-23 NOTE — Progress Notes (Signed)
Pharmacy Antibiotic Note  Brenda [name] is a 65 y.o. female admitted on 05/20/2016 with COPD exacerbation and recurrent aspiration pneumonia. She recently finished course of antibiotics with Augmentin on 05/16/2016. Now beginning D#4 of empiric cefepime.  Sputum culture growing few E.Coli (pan-susceptible) and few Candida albicans.     Procalcitonin last checked 12/18 (1.72).    In the interim, leukocytosis has resolved.   Plan: Consider de-escalating cefepime to ceftriaxone based upon sputum culture result. Discuss anticipated duration of therapy. Marland Kitchen Height: '5\' 5"'$  (165.1 cm) Weight: 132 lb 15 oz (60.3 kg) IBW/kg (Calculated) : 57  Temp (24hrs), Avg:98.3 F (36.8 C), Min:98.1 F (36.7 C), Max:98.6 F (37 C)   Recent Labs Lab 05/20/16 1319 05/21/16 0547 05/21/16 1739 05/21/16 1743  WBC 14.3* 8.7 8.1  --   CREATININE 0.91 0.61  --  0.76    Estimated Creatinine Clearance: 63.1 mL/min (by C-G formula based on SCr of 0.76 mg/dL).    Allergies  Allergen Reactions  . Tape Hives and Other (See Comments)    USE PAPER TAPE ONLY- Adhesive peels off skin-makes pt. Raw.  . Isosorbide Other (See Comments)    Drops BP too low  . Nicoderm [Nicotine] Rash    To the [facility] only, breakouts on skin    Antimicrobials this admission: 12/18 >> Cefepime >>  Dose adjustments this admission: --  Microbiology results: 12/19: Sputum:  Few E.Coli (pan-susceptible); few Candida albicans   Thank you for allowing pharmacy to be a part of this patient's care.  Clayburn Pert, PharmD, BCPS Pager: [phone] 05/23/2016  11:04 AM

## 2016-05-24 ENCOUNTER — Inpatient Hospital Stay (HOSPITAL_COMMUNITY): Payer: Medicare Other

## 2016-05-24 LAB — CBC WITH DIFFERENTIAL/PLATELET
BASOS ABS: 0 10*3/uL (ref 0.0–0.1)
Basophils Relative: 0 %
EOS PCT: 1 %
Eosinophils Absolute: 0.1 10*3/uL (ref 0.0–0.7)
HCT: 30.2 % — ABNORMAL LOW (ref 36.0–46.0)
HEMOGLOBIN: 10.1 g/dL — AB (ref 12.0–15.0)
LYMPHS PCT: 15 %
Lymphs Abs: 1.2 10*3/uL (ref 0.7–4.0)
MCH: 29.1 pg (ref 26.0–34.0)
MCHC: 33.4 g/dL (ref 30.0–36.0)
MCV: 87 fL (ref 78.0–100.0)
Monocytes Absolute: 0.8 10*3/uL (ref 0.1–1.0)
Monocytes Relative: 10 %
NEUTROS ABS: 6 10*3/uL (ref 1.7–7.7)
NEUTROS PCT: 74 %
PLATELETS: 233 10*3/uL (ref 150–400)
RBC: 3.47 MIL/uL — AB (ref 3.87–5.11)
RDW: 15.3 % (ref 11.5–15.5)
WBC: 8.1 10*3/uL (ref 4.0–10.5)

## 2016-05-24 LAB — BASIC METABOLIC PANEL
ANION GAP: 7 (ref 5–15)
BUN: 18 mg/dL (ref 6–20)
CO2: 32 mmol/L (ref 22–32)
Calcium: 8.6 mg/dL — ABNORMAL LOW (ref 8.9–10.3)
Chloride: 97 mmol/L — ABNORMAL LOW (ref 101–111)
Creatinine, Ser: 0.61 mg/dL (ref 0.44–1.00)
Glucose, Bld: 58 mg/dL — ABNORMAL LOW (ref 65–99)
POTASSIUM: 3.3 mmol/L — AB (ref 3.5–5.1)
SODIUM: 136 mmol/L (ref 135–145)

## 2016-05-24 LAB — GLUCOSE, CAPILLARY
GLUCOSE-CAPILLARY: 78 mg/dL (ref 65–99)
Glucose-Capillary: 40 mg/dL — CL (ref 65–99)
Glucose-Capillary: 94 mg/dL (ref 65–99)

## 2016-05-24 LAB — LEGIONELLA PNEUMOPHILA SEROGP 1 UR AG: L. PNEUMOPHILA SEROGP 1 UR AG: NEGATIVE

## 2016-05-24 MED ORDER — ROSUVASTATIN CALCIUM 20 MG PO TABS: 20.0000 mg | ORAL_TABLET | Freq: Every day | ORAL | Status: DC

## 2016-05-24 MED ORDER — POTASSIUM CHLORIDE CRYS ER 20 MEQ PO TBCR
20.0000 meq | EXTENDED_RELEASE_TABLET | ORAL | Status: AC
  Administered 2016-05-24 (×2): 20 meq via ORAL
  Filled 2016-05-24: qty 1

## 2016-05-24 MED ORDER — AMOXICILLIN-POT CLAVULANATE 875-125 MG PO TABS: 1.0000 | ORAL_TABLET | Freq: Two times a day (BID) | ORAL | 0 refills | Status: DC

## 2016-05-24 MED ORDER — PREDNISONE 10 MG PO TABS: ORAL_TABLET | ORAL | 0 refills | Status: DC

## 2016-05-24 NOTE — Progress Notes (Signed)
Patient ambulated in the hall, and required 2 liters of oxygen in order to maintain oxygen saturation levels of 89 to 95 %.

## 2016-05-24 NOTE — Discharge Summary (Signed)
Physician Discharge Summary       Patient ID: Brenda [name] MRN: [id number] DOB/AGE: April 15, 1951 65 y.o.  Admit date: 05/20/2016 Discharge date: 05/24/2016  Discharge Diagnoses:  Acute on Chronic Hypoxic Respiratory Failure recurrent aspiration pneumonia E coli Pneumonia COPD with Exacerbation. Diabetes Mellitus Type II Hyperglycemia Hypoglycemia HYponatremia  Dysphagia Right Lung Spiculated Nodule Hyponatremia  H/O GERD & Hiatal Hernia H/O CAD & PAD H/O Depression Detailed Hospital Course:   This is a 65 year old female of Dr Anastasia Pall who follows her for chronic respiratory failure in the setting of severe COPD, further complicated by still active smoking and severe dysphagia w/ recurrent aspiration. Her baseline FEV1 is 27% predicted. She was seen in the office on 12/18 after just leaving the hospital about 1 week prior. Since the time of discharge she developed increased weakness, chest discomfort and still had trouble with swallowing. HEr cough was worse and she was more short of breath. She was also experiencing chills. On exam there was concern for new RLL consolidation and given her frail state it was felt that she would do better w/ initiation of IV antibiotics and in-patient support for acute exacerbation of her COPD.   She was directly admitted to the med/surg floor. Diagnostic eval consisted of admitting CXR: which verified new RLL airspace disease, sputum culture which eventually grew out E-coli, routine blood work, which demonstrated mild hyponatremia and procalcitonin which was initially elevated. She was treated w/ Empiric antibiotics, scheduled BDs, supplemental oxygen and systemic steroids. Additionally her aspiration precautions were continued to be observed w/ dysphagia diet.   She made slow and gradual improvement. Her Antibiotics were narrowed from maxipime to unasyn on 12/21, we began steroid taper and continued supportive care. Her hospital course was  complicated by fluctuating glucose w/ episodes of both hyper and hypoglycemia. As of 12/22 her f/u CXR showed persistent & perhaps slightly improved right sided airspace disease. Clinically she felt much better and was deemed ready for d/c w/ follow up as described below. We did check walking oximetry prior to d/c and she required 2 liters of oxygen to keep stas >89%.     Discharge Plan by active problems  Acute on Chronic Hypoxic Respiratory Failure:  Secondary to recurrent aspiration pneumonia + E coli Pneumonia Plan   - Continuing Mucinex '1200mg'$  bid.  - Day 5/10 abx, switch to augmentin - f/u w/ Jan 3rd at 4pm w Eric Form   Very Severe COPD with Exacerbation:  Plan - resume home BDs - Prednisone taper over 2 weeks  Diabetes Mellitus Type 2: Had been labile Plan - resume home rx w/ pred taper   Dysphagia w/ Recurrent Aspiration:  Plan -Continuing Dysphagia 3 diet as previously recommended. Follows with Dr. Benson Norway.  Right Lung Spiculated Nodule Plan - for repeat CT Chest w/o in February 2018.  H/O GERD & Hiatal Hernia:   Plan - Continuing PO Protonix.   H/O CAD & PAD Plan -  Continuing Crestor '20mg'$  daily, ASA '81mg'$  daily & Plavix '75mg'$  daily.  H/O Depression Plan - Continuing home Wellbutrin XL daily.     Significant Hospital tests/ studies  Consults: none Sputum: pan-sensitive e-coli.    Discharge Exam: BP (!) 138/57 (BP Location: Right Arm)   Pulse 82   Temp 98.5 F (36.9 C) (Oral)   Resp 18   Ht '5\' 5"'$  (1.651 m)   Wt 133 lb 6.1 oz (60.5 kg)   LMP  (LMP Unknown)   SpO2 98%   BMI  22.20 kg/m   General:  Awake. Comfortable. Watching TV.  Integument:  Warm & dry. No rash on exposed skin.  HEENT:  Moist mucus membranes. No oral ulcers. No scleral icterus. Cardiovascular:  Regular rate. No edema. Normal S1 & S2. Pulmonary:  Improving crackles right lung base. Good aeration bilaterally. Speaking in complete sentences. Some rhonchi after eating  Abdomen: Soft.  Normal bowel sounds. Nontender. Musculoskeletal:  Normal bulk and tone. No joint deformity or effusion appreciated.  Labs at discharge Lab Results  Component Value Date   CREATININE 0.61 05/24/2016   BUN 18 05/24/2016   NA 136 05/24/2016   K 3.3 (L) 05/24/2016   CL 97 (L) 05/24/2016   CO2 32 05/24/2016   Lab Results  Component Value Date   WBC 8.1 05/24/2016   HGB 10.1 (L) 05/24/2016   HCT 30.2 (L) 05/24/2016   MCV 87.0 05/24/2016   PLT 233 05/24/2016   Lab Results  Component Value Date   ALT 12 (L) 05/20/2016   AST 14 (L) 05/20/2016   ALKPHOS 161 (H) 05/20/2016   BILITOT 0.7 05/20/2016   Lab Results  Component Value Date   INR 0.92 10/05/2015   INR 1.13 08/14/2014   INR 0.87 02/04/2013    Current radiology studies Dg Chest Port 1 View  Result Date: 05/24/2016 CLINICAL DATA:  Shortness of breath EXAM: PORTABLE CHEST 1 VIEW COMPARISON:  Four days ago FINDINGS: Right basilar airspace disease consistent with pneumonia. Background emphysematous change. No evidence of effusion or cavitation. Normal heart size. Status post coronary stenting. IMPRESSION: 1. Right basilar pneumonia without change from 4 days ago. 2. Emphysema. Electronically Signed   By: Monte Fantasia M.D.   On: 05/24/2016 10:40    Disposition:  01-Home or Self Care  Discharge Instructions    Call MD for:    Complete by:  As directed    Call MD for:  difficulty breathing, headache or visual disturbances    Complete by:  As directed    Call MD for:  temperature >100.4    Complete by:  As directed    Diet - low sodium heart healthy    Complete by:  As directed    For home use only DME oxygen    Complete by:  As directed    Mode or (Route):  Nasal cannula   Liters per Minute:  2   Oxygen delivery system:  Gas   Increase activity slowly    Complete by:  As directed      Allergies as of 05/24/2016      Reactions   Tape Hives, Other (See Comments)   USE PAPER TAPE ONLY- Adhesive peels off  skin-makes pt. Raw.   Isosorbide Other (See Comments)   Drops BP too low   Nicoderm [nicotine] Rash   To the PATCH only, breakouts on skin      Medication List    TAKE these medications   acetaminophen 325 MG tablet Commonly known as:  TYLENOL Take 2 tablets (650 mg total) by mouth every 6 (six) hours as needed. What changed:  reasons to take this   albuterol 108 (90 Base) MCG/ACT inhaler Commonly known as:  PROVENTIL HFA;VENTOLIN HFA Inhale 2 puffs into the lungs every 6 (six) hours as needed for wheezing or shortness of breath.   amoxicillin-clavulanate 875-125 MG tablet Commonly known as:  AUGMENTIN Take 1 tablet by mouth 2 (two) times daily.   aspirin EC 81 MG tablet Take 1 tablet (81 mg total)  by mouth daily.   bisoprolol 5 MG tablet Commonly known as:  ZEBETA Take 0.5 tablets (2.5 mg total) by mouth daily.   buPROPion 300 MG 24 hr tablet Commonly known as:  WELLBUTRIN XL Take 300 mg by mouth daily.   clopidogrel 75 MG tablet Commonly known as:  PLAVIX Take 1 tablet (75 mg total) by mouth daily.   cyclobenzaprine 5 MG tablet Commonly known as:  FLEXERIL Take 1-2 tablets (5-10 mg total) by mouth 3 (three) times daily as needed for muscle spasms.   docusate sodium 100 MG capsule Commonly known as:  COLACE Take 100 mg by mouth daily as needed for mild constipation.   feeding supplement (GLUCERNA SHAKE) Liqd Take 237 mLs by mouth 2 (two) times daily between meals.   fish oil-omega-3 fatty acids 1000 MG capsule Take 1 g by mouth 2 (two) times daily.   fluticasone 50 MCG/ACT nasal spray Commonly known as:  FLONASE Place 2 sprays into both nostrils daily.   fluticasone furoate-vilanterol 100-25 MCG/INH Aepb Commonly known as:  BREO ELLIPTA Inhale 1 puff into the lungs daily.   guaiFENesin 600 MG 12 hr tablet Commonly known as:  MUCINEX Take 1,200 mg by mouth 2 (two) times daily.   ibuprofen 200 MG tablet Commonly known as:  ADVIL,MOTRIN Take 400 mg by  mouth every 6 (six) hours as needed (Pain).   insulin glargine 100 UNIT/ML injection Commonly known as:  LANTUS Inject 0.1 mLs (10 Units total) into the skin at bedtime.   insulin lispro 100 UNIT/ML injection Commonly known as:  HUMALOG Inject 3 Units into the skin 3 (three) times daily with meals as needed for high blood sugar.   ipratropium-albuterol 0.5-2.5 (3) MG/3ML Soln Commonly known as:  DUONEB Take 3 mLs by nebulization every 6 (six) hours. What changed:  when to take this  reasons to take this   loratadine 10 MG tablet Commonly known as:  CLARITIN Take 1 tablet (10 mg total) by mouth daily.   MULTIVITAMIN & MINERAL PO Take 1 tablet by mouth daily.   neomycin-bacitracin-polymyxin ointment Commonly known as:  NEOSPORIN Apply 1 application topically as needed. For open wound   nitroGLYCERIN 0.4 MG/SPRAY spray Commonly known as:  NITROLINGUAL Place 1 spray under the tongue every 5 (five) minutes as needed for chest pain.   omeprazole 20 MG capsule Commonly known as:  PRILOSEC Take 20 mg by mouth daily.   polyethylene glycol powder powder Commonly known as:  GLYCOLAX/MIRALAX Take 17 g by mouth 2 (two) times daily.   predniSONE 10 MG tablet Commonly known as:  DELTASONE Take 4 tabs  daily with food x 4 days, then 3 tabs daily x 4 days, then 2 tabs daily x 4 days, then 1 tab daily x4 days then stop. #40   RESOURCE THICKENUP CLEAR Powd Use with thin liquids to make nectar thickened consistency   rosuvastatin 20 MG tablet Commonly known as:  CRESTOR Take 20 mg by mouth daily.   SPIRIVA RESPIMAT 2.5 MCG/ACT Aers Generic drug:  Tiotropium Bromide Monohydrate 2 puffs daily.   traMADol 50 MG tablet Commonly known as:  ULTRAM Take 1 tablet (50 mg total) by mouth every 6 (six) hours as needed for moderate pain or severe pain.            Durable Medical Equipment        Start     Ordered   05/24/16 0000  For home use only DME oxygen    Question Answer  Comment  Mode or (Route) Nasal cannula   Liters per Minute 2   Oxygen delivery system Gas      05/24/16 1047     Follow-up Information    Magdalen Spatz, NP Follow up on 06/05/2016.   Specialty:  Pulmonary Disease Why:  at 4pm Contact information: [address] [place] [address] [phone]           Discharged Condition: fair  Physician Statement:   The Patient was personally examined, the discharge assessment and plan has been personally reviewed and I agree with ACNP Aracelys Glade's assessment and plan. > 30 minutes of time have been dedicated to discharge assessment, planning and discharge instructions.   Signed: Clementeen Graham 05/24/2016, 10:48 AM

## 2016-05-24 NOTE — Progress Notes (Signed)
Per nurse, patient states she has home O2 in place, provided by [facility], family to bring travel tank for d/c home.

## 2016-05-24 NOTE — Progress Notes (Signed)
Hypoglycemic Event  CBG: 40  Treatment: Orange Juice  Symptoms: Weakness  Follow-up CBG: Time:0810 CBG Result:94

## 2016-05-24 NOTE — Progress Notes (Signed)
Patient given discharge instructions, and verbalized an understanding of all discharge instructions.  Patient agrees with discharge plan, and is being discharged in stable medical condition.  Patient given transportation via wheelchair. 

## 2016-05-30 ENCOUNTER — Emergency Department (HOSPITAL_COMMUNITY): Payer: Medicare Other

## 2016-05-30 ENCOUNTER — Encounter (HOSPITAL_COMMUNITY): Payer: Self-pay | Admitting: Emergency Medicine

## 2016-05-30 ENCOUNTER — Inpatient Hospital Stay (HOSPITAL_COMMUNITY)
Admission: EM | Admit: 2016-05-30 | Discharge: 2016-06-03 | DRG: 177 | Disposition: A | Discharge status: Home Health | Payer: Medicare Other | Attending: Nephrology | Admitting: Nephrology

## 2016-05-30 DIAGNOSIS — Z9981 Dependence on supplemental oxygen: Secondary | ICD-10-CM | POA: Diagnosis not present

## 2016-05-30 DIAGNOSIS — Z8673 Personal history of transient ischemic attack (TIA), and cerebral infarction without residual deficits: Secondary | ICD-10-CM | POA: Diagnosis not present

## 2016-05-30 DIAGNOSIS — E1151 Type 2 diabetes mellitus with diabetic peripheral angiopathy without gangrene: Secondary | ICD-10-CM | POA: Diagnosis present

## 2016-05-30 DIAGNOSIS — Z961 Presence of intraocular lens: Secondary | ICD-10-CM | POA: Diagnosis present

## 2016-05-30 DIAGNOSIS — S22060A Wedge compression fracture of T7-T8 vertebra, initial encounter for closed fracture: Secondary | ICD-10-CM

## 2016-05-30 DIAGNOSIS — J181 Lobar pneumonia, unspecified organism: Secondary | ICD-10-CM | POA: Diagnosis not present

## 2016-05-30 DIAGNOSIS — I252 Old myocardial infarction: Secondary | ICD-10-CM | POA: Diagnosis not present

## 2016-05-30 DIAGNOSIS — J690 Pneumonitis due to inhalation of food and vomit: Principal | ICD-10-CM

## 2016-05-30 DIAGNOSIS — Z86718 Personal history of other venous thrombosis and embolism: Secondary | ICD-10-CM | POA: Diagnosis not present

## 2016-05-30 DIAGNOSIS — M19042 Primary osteoarthritis, left hand: Secondary | ICD-10-CM | POA: Diagnosis present

## 2016-05-30 DIAGNOSIS — Z7902 Long term (current) use of antithrombotics/antiplatelets: Secondary | ICD-10-CM | POA: Diagnosis not present

## 2016-05-30 DIAGNOSIS — I2510 Atherosclerotic heart disease of native coronary artery without angina pectoris: Secondary | ICD-10-CM | POA: Diagnosis present

## 2016-05-30 DIAGNOSIS — M549 Dorsalgia, unspecified: Secondary | ICD-10-CM | POA: Diagnosis present

## 2016-05-30 DIAGNOSIS — Z888 Allergy status to other drugs, medicaments and biological substances status: Secondary | ICD-10-CM

## 2016-05-30 DIAGNOSIS — J449 Chronic obstructive pulmonary disease, unspecified: Secondary | ICD-10-CM | POA: Diagnosis present

## 2016-05-30 DIAGNOSIS — Z87891 Personal history of nicotine dependence: Secondary | ICD-10-CM | POA: Diagnosis not present

## 2016-05-30 DIAGNOSIS — S22000A Wedge compression fracture of unspecified thoracic vertebra, initial encounter for closed fracture: Secondary | ICD-10-CM

## 2016-05-30 DIAGNOSIS — E7800 Pure hypercholesterolemia, unspecified: Secondary | ICD-10-CM | POA: Diagnosis present

## 2016-05-30 DIAGNOSIS — Z955 Presence of coronary angioplasty implant and graft: Secondary | ICD-10-CM

## 2016-05-30 DIAGNOSIS — J189 Pneumonia, unspecified organism: Secondary | ICD-10-CM | POA: Diagnosis present

## 2016-05-30 DIAGNOSIS — E1121 Type 2 diabetes mellitus with diabetic nephropathy: Secondary | ICD-10-CM | POA: Diagnosis not present

## 2016-05-30 DIAGNOSIS — IMO0002 Reserved for concepts with insufficient information to code with codable children: Secondary | ICD-10-CM | POA: Diagnosis present

## 2016-05-30 DIAGNOSIS — Z9841 Cataract extraction status, right eye: Secondary | ICD-10-CM

## 2016-05-30 DIAGNOSIS — E1165 Type 2 diabetes mellitus with hyperglycemia: Secondary | ICD-10-CM | POA: Diagnosis present

## 2016-05-30 DIAGNOSIS — Z7982 Long term (current) use of aspirin: Secondary | ICD-10-CM | POA: Diagnosis not present

## 2016-05-30 DIAGNOSIS — M4850XA Collapsed vertebra, not elsewhere classified, site unspecified, initial encounter for fracture: Secondary | ICD-10-CM | POA: Diagnosis not present

## 2016-05-30 DIAGNOSIS — M4854XA Collapsed vertebra, not elsewhere classified, thoracic region, initial encounter for fracture: Secondary | ICD-10-CM | POA: Diagnosis present

## 2016-05-30 DIAGNOSIS — K219 Gastro-esophageal reflux disease without esophagitis: Secondary | ICD-10-CM | POA: Diagnosis present

## 2016-05-30 DIAGNOSIS — I6529 Occlusion and stenosis of unspecified carotid artery: Secondary | ICD-10-CM | POA: Diagnosis present

## 2016-05-30 DIAGNOSIS — J9621 Acute and chronic respiratory failure with hypoxia: Secondary | ICD-10-CM | POA: Diagnosis not present

## 2016-05-30 DIAGNOSIS — R1310 Dysphagia, unspecified: Secondary | ICD-10-CM

## 2016-05-30 DIAGNOSIS — Z85810 Personal history of malignant neoplasm of tongue: Secondary | ICD-10-CM | POA: Diagnosis not present

## 2016-05-30 DIAGNOSIS — Z8249 Family history of ischemic heart disease and other diseases of the circulatory system: Secondary | ICD-10-CM

## 2016-05-30 DIAGNOSIS — Z800 Family history of malignant neoplasm of digestive organs: Secondary | ICD-10-CM

## 2016-05-30 DIAGNOSIS — R0602 Shortness of breath: Secondary | ICD-10-CM | POA: Diagnosis present

## 2016-05-30 DIAGNOSIS — Z91048 Other nonmedicinal substance allergy status: Secondary | ICD-10-CM | POA: Diagnosis not present

## 2016-05-30 DIAGNOSIS — Z833 Family history of diabetes mellitus: Secondary | ICD-10-CM

## 2016-05-30 DIAGNOSIS — J439 Emphysema, unspecified: Secondary | ICD-10-CM | POA: Diagnosis not present

## 2016-05-30 DIAGNOSIS — S22009A Unspecified fracture of unspecified thoracic vertebra, initial encounter for closed fracture: Secondary | ICD-10-CM

## 2016-05-30 DIAGNOSIS — J441 Chronic obstructive pulmonary disease with (acute) exacerbation: Secondary | ICD-10-CM | POA: Diagnosis present

## 2016-05-30 DIAGNOSIS — Z794 Long term (current) use of insulin: Secondary | ICD-10-CM | POA: Diagnosis not present

## 2016-05-30 DIAGNOSIS — Z802 Family history of malignant neoplasm of other respiratory and intrathoracic organs: Secondary | ICD-10-CM

## 2016-05-30 DIAGNOSIS — M19041 Primary osteoarthritis, right hand: Secondary | ICD-10-CM | POA: Diagnosis present

## 2016-05-30 DIAGNOSIS — Z7951 Long term (current) use of inhaled steroids: Secondary | ICD-10-CM

## 2016-05-30 DIAGNOSIS — C01 Malignant neoplasm of base of tongue: Secondary | ICD-10-CM | POA: Diagnosis not present

## 2016-05-30 DIAGNOSIS — Z803 Family history of malignant neoplasm of breast: Secondary | ICD-10-CM

## 2016-05-30 DIAGNOSIS — Z923 Personal history of irradiation: Secondary | ICD-10-CM

## 2016-05-30 DIAGNOSIS — F329 Major depressive disorder, single episode, unspecified: Secondary | ICD-10-CM | POA: Diagnosis present

## 2016-05-30 DIAGNOSIS — Z832 Family history of diseases of the blood and blood-forming organs and certain disorders involving the immune mechanism: Secondary | ICD-10-CM

## 2016-05-30 LAB — CBC WITH DIFFERENTIAL/PLATELET
Basophils Absolute: 0 10*3/uL (ref 0.0–0.1)
Basophils Relative: 0 %
Eosinophils Absolute: 0 10*3/uL (ref 0.0–0.7)
Eosinophils Relative: 0 %
HCT: 41.6 % (ref 36.0–46.0)
Hemoglobin: 13.3 g/dL (ref 12.0–15.0)
Lymphocytes Relative: 3 %
Lymphs Abs: 0.5 10*3/uL — ABNORMAL LOW (ref 0.7–4.0)
MCH: 29.2 pg (ref 26.0–34.0)
MCHC: 32 g/dL (ref 30.0–36.0)
MCV: 91.4 fL (ref 78.0–100.0)
Monocytes Absolute: 0.4 10*3/uL (ref 0.1–1.0)
Monocytes Relative: 2 %
Neutro Abs: 15.7 10*3/uL — ABNORMAL HIGH (ref 1.7–7.7)
Neutrophils Relative %: 95 %
Platelets: 240 10*3/uL (ref 150–400)
RBC: 4.55 MIL/uL (ref 3.87–5.11)
RDW: 16.2 % — ABNORMAL HIGH (ref 11.5–15.5)
WBC: 16.6 10*3/uL — ABNORMAL HIGH (ref 4.0–10.5)

## 2016-05-30 LAB — BASIC METABOLIC PANEL
ANION GAP: 7 (ref 5–15)
BUN: 19 mg/dL (ref 6–20)
CALCIUM: 9.4 mg/dL (ref 8.9–10.3)
CO2: 36 mmol/L — AB (ref 22–32)
Chloride: 88 mmol/L — ABNORMAL LOW (ref 101–111)
Creatinine, Ser: 0.86 mg/dL (ref 0.44–1.00)
Glucose, Bld: 485 mg/dL — ABNORMAL HIGH (ref 65–99)
POTASSIUM: 5.4 mmol/L — AB (ref 3.5–5.1)
Sodium: 131 mmol/L — ABNORMAL LOW (ref 135–145)

## 2016-05-30 LAB — POTASSIUM: POTASSIUM: 5.2 mmol/L — AB (ref 3.5–5.1)

## 2016-05-30 LAB — STREP PNEUMONIAE URINARY ANTIGEN: STREP PNEUMO URINARY ANTIGEN: NEGATIVE

## 2016-05-30 LAB — CBG MONITORING, ED: Glucose-Capillary: 482 mg/dL — ABNORMAL HIGH (ref 65–99)

## 2016-05-30 LAB — TROPONIN I: Troponin I: <0.03 ng/mL (ref <0.03)

## 2016-05-30 MED ORDER — INSULIN ASPART 100 UNIT/ML ~~LOC~~ SOLN
0.0000 [IU] | Freq: Three times a day (TID) | SUBCUTANEOUS | Status: DC
  Administered 2016-05-30: 5 [IU] via SUBCUTANEOUS
  Administered 2016-05-31: 2 [IU] via SUBCUTANEOUS
  Administered 2016-05-31: 8 [IU] via SUBCUTANEOUS
  Administered 2016-05-31: 2 [IU] via SUBCUTANEOUS
  Administered 2016-06-01: 5 [IU] via SUBCUTANEOUS
  Administered 2016-06-01: 3 [IU] via SUBCUTANEOUS
  Administered 2016-06-01 – 2016-06-02 (×2): 5 [IU] via SUBCUTANEOUS
  Administered 2016-06-02: 11 [IU] via SUBCUTANEOUS
  Administered 2016-06-02: 3 [IU] via SUBCUTANEOUS
  Administered 2016-06-03: 2 [IU] via SUBCUTANEOUS
  Administered 2016-06-03: 5 [IU] via SUBCUTANEOUS

## 2016-05-30 MED ORDER — DEXTROSE 5 % IV SOLN
500.0000 mg | INTRAVENOUS | Status: DC
  Administered 2016-05-30: 500 mg via INTRAVENOUS
  Filled 2016-05-30 (×2): qty 500

## 2016-05-30 MED ORDER — ALBUTEROL SULFATE (2.5 MG/3ML) 0.083% IN NEBU
5.0000 mg | INHALATION_SOLUTION | Freq: Once | RESPIRATORY_TRACT | Status: AC
  Administered 2016-05-30: 5 mg via RESPIRATORY_TRACT
  Filled 2016-05-30: qty 6

## 2016-05-30 MED ORDER — CYCLOBENZAPRINE HCL 10 MG PO TABS
5.0000 mg | ORAL_TABLET | Freq: Three times a day (TID) | ORAL | Status: DC | PRN
  Administered 2016-06-01 – 2016-06-02 (×2): 10 mg via ORAL
  Filled 2016-05-30 (×2): qty 1

## 2016-05-30 MED ORDER — INSULIN GLARGINE 100 UNIT/ML ~~LOC~~ SOLN
10.0000 [IU] | Freq: Every day | SUBCUTANEOUS | Status: DC
  Administered 2016-05-30 – 2016-06-02 (×4): 10 [IU] via SUBCUTANEOUS
  Filled 2016-05-30 (×5): qty 0.1

## 2016-05-30 MED ORDER — CLOPIDOGREL BISULFATE 75 MG PO TABS
75.0000 mg | ORAL_TABLET | Freq: Every day | ORAL | Status: DC
  Administered 2016-05-31: 75 mg via ORAL
  Filled 2016-05-30: qty 1

## 2016-05-30 MED ORDER — PREDNISONE 20 MG PO TABS: 30.0000 mg | ORAL_TABLET | Freq: Every day | ORAL | Status: DC

## 2016-05-30 MED ORDER — ENOXAPARIN SODIUM 40 MG/0.4ML ~~LOC~~ SOLN
40.0000 mg | SUBCUTANEOUS | Status: DC
  Administered 2016-05-30 – 2016-06-02 (×4): 40 mg via SUBCUTANEOUS
  Filled 2016-05-30 (×4): qty 0.4

## 2016-05-30 MED ORDER — BISOPROLOL FUMARATE 5 MG PO TABS
2.5000 mg | ORAL_TABLET | Freq: Every day | ORAL | Status: DC
  Administered 2016-05-31 – 2016-06-03 (×4): 2.5 mg via ORAL
  Filled 2016-05-30 (×4): qty 1

## 2016-05-30 MED ORDER — PREDNISONE 10 MG PO TABS: 10.0000 mg | ORAL_TABLET | Freq: Every day | ORAL | Status: DC

## 2016-05-30 MED ORDER — ALBUTEROL SULFATE (2.5 MG/3ML) 0.083% IN NEBU: 3.0000 mL | INHALATION_SOLUTION | Freq: Four times a day (QID) | RESPIRATORY_TRACT | Status: DC | PRN

## 2016-05-30 MED ORDER — IPRATROPIUM BROMIDE 0.02 % IN SOLN
0.5000 mg | Freq: Once | RESPIRATORY_TRACT | Status: AC
  Administered 2016-05-30: 0.5 mg via RESPIRATORY_TRACT
  Filled 2016-05-30: qty 2.5

## 2016-05-30 MED ORDER — ALBUTEROL (5 MG/ML) CONTINUOUS INHALATION SOLN
10.0000 mg/h | INHALATION_SOLUTION | RESPIRATORY_TRACT | Status: AC
  Administered 2016-05-30: 10 mg/h via RESPIRATORY_TRACT
  Filled 2016-05-30: qty 20

## 2016-05-30 MED ORDER — IPRATROPIUM-ALBUTEROL 0.5-2.5 (3) MG/3ML IN SOLN: 3.0000 mL | Freq: Two times a day (BID) | RESPIRATORY_TRACT | Status: DC | PRN

## 2016-05-30 MED ORDER — DEXTROSE 5 % IV SOLN
1.0000 g | Freq: Once | INTRAVENOUS | Status: DC
  Filled 2016-05-30: qty 10

## 2016-05-30 MED ORDER — OXYCODONE-ACETAMINOPHEN 5-325 MG PO TABS
1.0000 | ORAL_TABLET | Freq: Once | ORAL | Status: AC
  Administered 2016-05-30: 1 via ORAL
  Filled 2016-05-30: qty 1

## 2016-05-30 MED ORDER — LORATADINE 10 MG PO TABS
10.0000 mg | ORAL_TABLET | Freq: Every day | ORAL | Status: DC
  Administered 2016-05-31 – 2016-06-03 (×4): 10 mg via ORAL
  Filled 2016-05-30 (×4): qty 1

## 2016-05-30 MED ORDER — ASPIRIN EC 81 MG PO TBEC
81.0000 mg | DELAYED_RELEASE_TABLET | Freq: Every day | ORAL | Status: DC
  Administered 2016-05-31 – 2016-06-03 (×4): 81 mg via ORAL
  Filled 2016-05-30 (×5): qty 1

## 2016-05-30 MED ORDER — GLUCERNA SHAKE PO LIQD
237.0000 mL | Freq: Two times a day (BID) | ORAL | Status: DC
Start: 2016-05-31 — End: 2016-06-03
  Administered 2016-06-02 – 2016-06-03 (×2): 237 mL via ORAL
  Filled 2016-05-30 (×8): qty 237

## 2016-05-30 MED ORDER — IPRATROPIUM-ALBUTEROL 0.5-2.5 (3) MG/3ML IN SOLN: 3.0000 mL | Freq: Four times a day (QID) | RESPIRATORY_TRACT | Status: DC

## 2016-05-30 MED ORDER — ROSUVASTATIN CALCIUM 20 MG PO TABS
20.0000 mg | ORAL_TABLET | Freq: Every day | ORAL | Status: DC
  Administered 2016-05-31 – 2016-06-03 (×4): 20 mg via ORAL
  Filled 2016-05-30: qty 1
  Filled 2016-05-30 (×3): qty 2
  Filled 2016-05-30 (×3): qty 1
  Filled 2016-05-30: qty 2

## 2016-05-30 MED ORDER — IPRATROPIUM-ALBUTEROL 0.5-2.5 (3) MG/3ML IN SOLN
3.0000 mL | Freq: Four times a day (QID) | RESPIRATORY_TRACT | Status: DC
  Administered 2016-05-30 – 2016-06-02 (×10): 3 mL via RESPIRATORY_TRACT
  Filled 2016-05-30 (×13): qty 3

## 2016-05-30 MED ORDER — RESOURCE THICKENUP CLEAR PO POWD
ORAL | Status: DC | PRN
  Filled 2016-05-30: qty 125

## 2016-05-30 MED ORDER — OXYCODONE-ACETAMINOPHEN 5-325 MG PO TABS
1.0000 | ORAL_TABLET | ORAL | Status: AC | PRN
  Administered 2016-05-30 – 2016-06-02 (×2): 1 via ORAL
  Filled 2016-05-30 (×4): qty 1

## 2016-05-30 MED ORDER — PANTOPRAZOLE SODIUM 40 MG PO TBEC
40.0000 mg | DELAYED_RELEASE_TABLET | Freq: Every day | ORAL | Status: DC
  Administered 2016-05-31 – 2016-06-03 (×4): 40 mg via ORAL
  Filled 2016-05-30 (×4): qty 1

## 2016-05-30 MED ORDER — DEXTROSE 5 % IV SOLN
1.0000 g | INTRAVENOUS | Status: DC
  Administered 2016-05-31 – 2016-06-02 (×3): 1 g via INTRAVENOUS
  Filled 2016-05-30 (×4): qty 10

## 2016-05-30 MED ORDER — BUPROPION HCL ER (XL) 150 MG PO TB24
300.0000 mg | ORAL_TABLET | Freq: Every day | ORAL | Status: DC
  Administered 2016-05-31 – 2016-06-03 (×4): 300 mg via ORAL
  Filled 2016-05-30 (×5): qty 2

## 2016-05-30 MED ORDER — PREDNISONE 20 MG PO TABS
20.0000 mg | ORAL_TABLET | Freq: Every day | ORAL | Status: DC
  Administered 2016-06-01 – 2016-06-03 (×3): 20 mg via ORAL
  Filled 2016-05-30 (×3): qty 1

## 2016-05-30 MED ORDER — IBUPROFEN 200 MG PO TABS: 400.0000 mg | ORAL_TABLET | Freq: Four times a day (QID) | ORAL | Status: DC | PRN

## 2016-05-30 MED ORDER — FLUTICASONE FUROATE-VILANTEROL 100-25 MCG/INH IN AEPB
1.0000 | INHALATION_SPRAY | Freq: Every day | RESPIRATORY_TRACT | Status: DC
  Administered 2016-05-31 – 2016-06-03 (×4): 1 via RESPIRATORY_TRACT
  Filled 2016-05-30: qty 28

## 2016-05-30 MED ORDER — INSULIN ASPART 100 UNIT/ML ~~LOC~~ SOLN
5.0000 [IU] | Freq: Once | SUBCUTANEOUS | Status: DC
  Filled 2016-05-30: qty 1

## 2016-05-30 MED ORDER — DEXTROSE 5 % IV SOLN
1.0000 g | Freq: Once | INTRAVENOUS | Status: AC
  Administered 2016-05-30: 1 g via INTRAVENOUS

## 2016-05-30 MED ORDER — OXYCODONE-ACETAMINOPHEN 5-325 MG PO TABS
1.0000 | ORAL_TABLET | ORAL | Status: DC | PRN
  Administered 2016-05-31 – 2016-06-02 (×7): 1 via ORAL
  Filled 2016-05-30 (×6): qty 1

## 2016-05-30 MED ORDER — GUAIFENESIN ER 600 MG PO TB12
1200.0000 mg | ORAL_TABLET | Freq: Two times a day (BID) | ORAL | Status: DC
  Administered 2016-05-30 – 2016-06-03 (×8): 1200 mg via ORAL
  Filled 2016-05-30 (×9): qty 2

## 2016-05-30 MED ORDER — PREDNISONE 20 MG PO TABS
30.0000 mg | ORAL_TABLET | Freq: Every day | ORAL | Status: AC
  Administered 2016-05-31: 30 mg via ORAL
  Filled 2016-05-30: qty 1

## 2016-05-30 MED ORDER — TIOTROPIUM BROMIDE MONOHYDRATE 2.5 MCG/ACT IN AERS: 2.0000 | INHALATION_SPRAY | Freq: Every day | RESPIRATORY_TRACT | Status: DC

## 2016-05-30 MED ORDER — OMEGA-3-ACID ETHYL ESTERS 1 G PO CAPS
1.0000 g | ORAL_CAPSULE | Freq: Two times a day (BID) | ORAL | Status: DC
  Administered 2016-05-30 – 2016-06-03 (×8): 1 g via ORAL
  Filled 2016-05-30 (×9): qty 1

## 2016-05-30 MED ORDER — FLUTICASONE PROPIONATE 50 MCG/ACT NA SUSP
2.0000 | Freq: Every day | NASAL | Status: DC
  Administered 2016-05-31 – 2016-06-03 (×4): 2 via NASAL
  Filled 2016-05-30: qty 16

## 2016-05-30 MED ORDER — ACETAMINOPHEN 325 MG PO TABS: 650.0000 mg | ORAL_TABLET | Freq: Four times a day (QID) | ORAL | Status: DC | PRN

## 2016-05-30 MED ORDER — POLYETHYLENE GLYCOL 3350 17 G PO PACK
17.0000 g | PACK | Freq: Two times a day (BID) | ORAL | Status: DC
  Administered 2016-05-31 – 2016-06-03 (×5): 17 g via ORAL
  Filled 2016-05-30 (×5): qty 1

## 2016-05-30 MED ORDER — SODIUM CHLORIDE 0.9 % IV BOLUS (SEPSIS)
500.0000 mL | Freq: Once | INTRAVENOUS | Status: AC
  Administered 2016-05-30: 500 mL via INTRAVENOUS

## 2016-05-30 NOTE — ED Notes (Signed)
Patient transported to X-ray 

## 2016-05-30 NOTE — H&P (Signed)
History and Physical    Brenda [name] FBP:[id number] DOB: January 13, 1951 DOA: 05/30/2016   PCP: [name], MD Chief Complaint:  Chief Complaint  Patient presents with  . Shortness of Breath    HPI: Brenda [name] is a 65 y.o. female with medical history significant of COPD, CAD with stents, dysphagia with recurrent aspiration pneumonia.  She had been doing well with regards to dysphagia on a Dys 3 diet and nectar thick liquids (other than her AM coffee).  Until she "slacked off" on this in October.  Since then she has been admitted 3 previous times (today makes #4) for recurrent aspiration pneumonia of the RLL also with COPD exacerbation.  She reports she quit smoking on Nov 11th.  Last admit was just last week, discharged 6 days ago on Augmentin for RLL PNA sputum cultures last week grew out E.Coli that was pan-sensitive.  She reports no specific aspiration event (but says she never does know when she aspirates).  But she reports increasing SOB over the past 3 days.  Severe back pain with cough.  Symptoms persistent and worsening.  Denies fever.  ED Course: WBC up to 16k from 8.1 on discharge.  She has been on steroids during last admit and remains on a taper (currently at the '30mg'$  x day dose of the taper).  Review of Systems: As per HPI otherwise 10 point review of systems negative.    Past Medical History:  Diagnosis Date  . Arthritis    "hands" (11/26/2012)  . CAD S/P percutaneous coronary angioplasty 1999; 2001;2003, 10/2014   a) BMS- mCx; b) re-do PCI for prog of Dz (NIR BMS 3.5 x 12); c) staged PCI: p-mRCA Express II BMS 3.0x24 & 3.5 x 16 --> p-mLAD Cyper DES 3.0 x 13 & PTCA of D2 (2.5 balloon); 10/2015 PCI p-m LAD Promus DES 2.75 x 38 (covers pre&post-stent ISR), POBA of ostial rPDA  . Cancer of base of tongue ([place]) 2010   "& lymph nodes @ right neck" (11/26/2012  . Carotid artery occlusion   . Chronic bronchitis ([place])   . COPD (chronic obstructive pulmonary disease) with  emphysema ([place])   . Depression   . GERD (gastroesophageal reflux disease)   . H/O hiatal hernia   . History of blood transfusion   . History of DVT (deep vein thrombosis)   . Hx of radiation therapy   . Hx of radiation therapy 01/16/09 - 03/06/09   base of tongue, right neck node  . Hypercholesteremia   . Myocardial infarction 1999, 2003  . PAD (peripheral artery disease) ([name])    status post bilateral femoropopliteal bypass grafting --> Dr. Sherren Mocha Early; -  02/2013 Revision of R Fem-AKPop to BK Pop.  . Patent foramen ovale February 2013   Small. Small PFO noted on TTE for TIA/CVA R-L + Bubble study with valsalva  . Pneumonia   . Stroke [facility])    left side weakness remains (11/26/2012)  . Transient ischemic attack (TIA) February 2013  . Type II diabetes mellitus ([place])     Past Surgical History:  Procedure Laterality Date  . ANKLE FRACTURE SURGERY Right 2007   "got a metal plate and 8 screws in it" (11/26/2012)  . APPENDECTOMY  1962  . BYPASS GRAFT POPLITEAL TO POPLITEAL Right 02/05/2013   Procedure: BYPASS GRAFT ABOVE KNEE POPLITEAL TO BELOW KNEE POPLITEAL WITH SMALL SAPHANOUS VEIN;  Surgeon: Rosetta Posner, MD;  Location: [place];  Service: Vascular;  Laterality: Right;  . CARDIAC CATHETERIZATION  1999; 10/2005   5/'07: mildLAD 20-30%, patent stent, diags- no Dz. RCA 2 BMS patent ~20% ISR.   Marland Kitchen CARDIAC CATHETERIZATION N/A 10/06/2015   Procedure: Left Heart Cath and Coronary Angiography;  Surgeon: Leonie Man, MD;  Location: [place] CV [facility];  Service: Cardiovascular; p-mLAD 99% pre-stent, 60% ISR & 80% post-stent, o-RPDA 90%, ~10% ISR in 2 RCA (Prox & mid) BMS,~20% ISR in pCx BMS with 5% OM2 BMS.    Marland Kitchen CARDIAC CATHETERIZATION N/A 10/06/2015   Procedure: Coronary Stent Intervention;  Surgeon: Leonie Man, MD;  Location: [place] [facility];  Service: Cardiovascular; p-m LAD Promus DES 2.75 x 38 (tapered post-dilatoin)  . CARDIAC CATHETERIZATION N/A 10/06/2015   Procedure: Coronary Balloon  Angioplasty;  Surgeon: Leonie Man, MD;  Location: [place] [facility];  Service: Cardiovascular;  POBA of Ostial rPDA.  Marland Kitchen CARPAL TUNNEL RELEASE Right 1980's?  Marland Kitchen CATARACT EXTRACTION W/ INTRAOCULAR LENS IMPLANT Right 2011  . [place]  . CORONARY ANGIOPLASTY WITH STENT PLACEMENT  1999--2005   1999 - OM2 BMS; 2001: p-mCx NIR BMS 3.5 x 12; 10/2001: p-mRCA Express II BMS - 3.5 x 16 - 3.0 x 24.   . ESOPHAGOGASTRODUODENOSCOPY  12/20/2011   Procedure: ESOPHAGOGASTRODUODENOSCOPY (EGD);  Surgeon: Beryle Beams, MD;  Location: [facility];  Service: Endoscopy;  Laterality: N/A;  EGD with balloon dilation  . EYE SURGERY    . FEMORAL BYPASS  1999; 2000   Bilateral Fem-AK POP bypass.   Marland Kitchen PERIPHERAL VASCULAR CATHETERIZATION N/A 12/01/2015   Procedure: Abdominal Aortogram w/Lower Extremity;  Surgeon: Elam Dutch, MD;  Location: [place] [facility];  Service: Cardiovascular;  Laterality: N/A;  . PERIPHERAL VASCULAR CATHETERIZATION Right 12/22/2015   Procedure: Peripheral Vascular Intervention;  Surgeon: Rosetta Posner, MD;  Location: [place] [facility];  Service: Cardiovascular;  Laterality: Right;  EXternal iliac  . SAVORY DILATION  12/20/2011   Procedure: SAVORY DILATION;  Surgeon: Beryle Beams, MD;  Location: [facility];  Service: Endoscopy;  Laterality: N/A;  . TEE WITHOUT CARDIOVERSION  07/26/2011   Procedure: TRANSESOPHAGEAL ECHOCARDIOGRAM (TEE);  Surgeon: Pixie Casino, MD;  Location[facility];  Service: Cardiovascular;  Laterality: N/A;  . TONSILLECTOMY  1969  . TRANSTHORACIC ECHOCARDIOGRAM  March 2016   Normal LV size and function EF 55-60%. No regional wall motion and amount is. No significant valvular lesions.  . TUBAL LIGATION  1988  . VAGINAL HYSTERECTOMY  1988     reports that she quit smoking about 8 weeks ago. Her smoking use included Cigarettes. She has a 50.00 pack-year smoking history. She has never used smokeless tobacco. She reports that she drinks alcohol. She  reports that she does not use drugs.  Allergies  Allergen Reactions  . Tape Hives and Other (See Comments)    USE PAPER TAPE ONLY- Adhesive peels off skin-makes pt. Raw.  . Isosorbide Other (See Comments)    Drops BP too low  . Nicoderm [Nicotine] Rash    To the PATCH only, breakouts on skin    Family History  Problem Relation Age of Onset  . Cancer Mother     pancreatic  . Diabetes Mother   . Hyperlipidemia Mother   . Hypertension Mother   . Other Mother     varicose vein  . Cancer Father 72    throat  . Heart disease Father   . Heart attack Father   . Cancer Sister     breast  .  Diabetes Sister   . Deep vein thrombosis Brother   . Diabetes Brother   . Hearing loss Brother   . Hyperlipidemia Brother   . Hypertension Brother   . Heart attack Brother   . Clotting disorder Brother   . Diabetes Son   . Hyperlipidemia Son       Prior to Admission medications   Medication Sig Start Date End Date Taking? Authorizing Provider  acetaminophen (TYLENOL) 325 MG tablet Take 2 tablets (650 mg total) by mouth every 6 (six) hours as needed. Patient taking differently: Take 650 mg by mouth every 6 (six) hours as needed for mild pain.  05/01/16  Yes [name], PA-C  albuterol (PROVENTIL HFA;VENTOLIN HFA) 108 (90 BASE) MCG/ACT inhaler Inhale 2 puffs into the lungs every 6 (six) hours as needed for wheezing or shortness of breath. 01/20/15  Yes Belkys A Regalado, MD  aspirin EC 81 MG tablet Take 1 tablet (81 mg total) by mouth daily. 10/07/15  Yes Thurnell Lose, MD  bisoprolol (ZEBETA) 5 MG tablet Take 0.5 tablets (2.5 mg total) by mouth daily. 05/15/16  Yes Orson Eva, MD  buPROPion (WELLBUTRIN XL) 300 MG 24 hr tablet Take 300 mg by mouth daily.  09/19/15  Yes Historical Provider, MD  clopidogrel (PLAVIX) 75 MG tablet Take 1 tablet (75 mg total) by mouth daily. 10/07/15  Yes Thurnell Lose, MD  cyclobenzaprine (FLEXERIL) 5 MG tablet Take 1-2 tablets (5-10 mg total) by mouth 3  (three) times daily as needed for muscle spasms. 05/09/16  Yes Naiping Ephriam Jenkins, MD  feeding supplement, GLUCERNA SHAKE, (GLUCERNA SHAKE) LIQD Take 237 mLs by mouth 2 (two) times daily between meals. 04/09/16  Yes Donita Brooks, NP  fish oil-omega-3 fatty acids 1000 MG capsule Take 1 g by mouth 2 (two) times daily.    Yes Historical Provider, MD  fluticasone (FLONASE) 50 MCG/ACT nasal spray Place 2 sprays into both nostrils daily. 06/02/15  Yes Debbe Odea, MD  fluticasone furoate-vilanterol (BREO ELLIPTA) 100-25 MCG/INH AEPB Inhale 1 puff into the lungs daily. 09/13/15  Yes Tammy S Parrett, NP  guaiFENesin (MUCINEX) 600 MG 12 hr tablet Take 1,200 mg by mouth 2 (two) times daily.   Yes Historical Provider, MD  ibuprofen (ADVIL,MOTRIN) 200 MG tablet Take 400 mg by mouth every 6 (six) hours as needed (Pain).   Yes Historical Provider, MD  insulin glargine (LANTUS) 100 UNIT/ML injection Inject 0.1 mLs (10 Units total) into the skin at bedtime. 05/14/16  Yes Orson Eva, MD  insulin lispro (HUMALOG) 100 UNIT/ML injection Inject 3 Units into the skin 3 (three) times daily with meals as needed for high blood sugar.    Yes Historical Provider, MD  ipratropium-albuterol (DUONEB) 0.5-2.5 (3) MG/3ML SOLN Take 3 mLs by nebulization every 6 (six) hours. Patient taking differently: Take 3 mLs by nebulization 2 (two) times daily as needed (for wheezingt or shortness of breath).  06/02/15  Yes Debbe Odea, MD  loratadine (CLARITIN) 10 MG tablet Take 1 tablet (10 mg total) by mouth daily. 06/02/15  Yes Debbe Odea, MD  Maltodextrin-Xanthan Gum ([place]) POWD Use with thin liquids to make nectar thickened consistency 05/14/16  Yes Orson Eva, MD  nitroGLYCERIN (NITROLINGUAL) 0.4 MG/SPRAY spray Place 1 spray under the tongue every 5 (five) minutes as needed for chest pain. 01/20/15  Yes Belkys A Regalado, MD  omeprazole (PRILOSEC) 20 MG capsule Take 20 mg by mouth daily.    Yes Historical Provider, MD  polyethylene glycol powder (GLYCOLAX/MIRALAX) powder Take 17 g by mouth 2 (two) times daily. 05/01/16  Yes [name], PA-C  predniSONE (DELTASONE) 10 MG tablet Take 4 tabs  daily with food x 4 days, then 3 tabs daily x 4 days, then 2 tabs daily x 4 days, then 1 tab daily x4 days then stop. #40 05/24/16  Yes Erick Colace, NP  rosuvastatin (CRESTOR) 20 MG tablet Take 20 mg by mouth daily.   Yes Historical Provider, MD  SPIRIVA RESPIMAT 2.5 MCG/ACT AERS 2 puffs daily. 10/24/15  Yes Historical Provider, MD  traMADol (ULTRAM) 50 MG tablet Take 1 tablet (50 mg total) by mouth every 6 (six) hours as needed for moderate pain or severe pain. 05/09/16  Yes Leandrew Koyanagi, MD    Physical Exam: Vitals:   05/30/16 1649 05/30/16 1730 05/30/16 1849 05/30/16 1927  BP: (!) 85/55 108/58 115/66 146/59  Pulse: 94 88 85 86  Resp: '21 16 19 18  '$ Temp:    98.1 F (36.7 C)  TempSrc:    Oral  SpO2: 93% 96% 96% 95%  Weight:      Height:          Constitutional: NAD, calm, comfortable Eyes: PERRL, lids and conjunctivae normal ENMT: Mucous membranes are moist. Posterior pharynx clear of any exudate or lesions.Normal dentition.  Neck: normal, supple, no masses, no thyromegaly Respiratory: wet sounding cough, coarse BS over RLL, scattered wheezes. Cardiovascular: Regular rate and rhythm, no murmurs / rubs / gallops. No extremity edema. 2+ pedal pulses. No carotid bruits.  Abdomen: no tenderness, no masses palpated. No hepatosplenomegaly. Bowel sounds positive.  Musculoskeletal: no clubbing / cyanosis. No joint deformity upper and lower extremities. Good ROM, no contractures. Normal muscle tone.  Skin: no rashes, lesions, ulcers. No induration Neurologic: CN 2-12 grossly intact. Sensation intact, DTR normal. Strength 5/5 in all 4.  Psychiatric: Normal judgment and insight. Alert and oriented x 3. Normal mood.    Labs on Admission: I have personally reviewed following labs and imaging  studies  CBC:  Recent Labs Lab 05/24/16 0531 05/30/16 1410  WBC 8.1 16.6*  NEUTROABS 6.0 15.7*  HGB 10.1* 13.3  HCT 30.2* 41.6  MCV 87.0 91.4  PLT 233 160   Basic Metabolic Panel:  Recent Labs Lab 05/24/16 0531 05/30/16 1423 05/30/16 1527  NA 136 131*  --   K 3.3* 5.4* 5.2*  CL 97* 88*  --   CO2 32 36*  --   GLUCOSE 58* 485*  --   BUN 18 19  --   CREATININE 0.61 0.86  --   CALCIUM 8.6* 9.4  --    GFR: Estimated Creatinine Clearance: 58.7 mL/min (by C-G formula based on SCr of 0.86 mg/dL). Liver Function Tests: No results for input(s): AST, ALT, ALKPHOS, BILITOT, PROT, ALBUMIN in the last 168 hours. No results for input(s): LIPASE, AMYLASE in the last 168 hours. No results for input(s): AMMONIA in the last 168 hours. Coagulation Profile: No results for input(s): INR, PROTIME in the last 168 hours. Cardiac Enzymes:  Recent Labs Lab 05/30/16 1423  TROPONINI <0.03   BNP (last 3 results) No results for input(s): PROBNP in the last 8760 hours. HbA1C: No results for input(s): HGBA1C in the last 72 hours. CBG:  Recent Labs Lab 05/23/16 2130 05/24/16 0744 05/24/16 0813 05/24/16 1143 05/30/16 1959  GLUCAP 372* 40* 94 78 482*   Lipid Profile: No results for input(s): CHOL, HDL, LDLCALC, TRIG, CHOLHDL, LDLDIRECT in the last 72  hours. Thyroid Function Tests: No results for input(s): TSH, T4TOTAL, FREET4, T3FREE, THYROIDAB in the last 72 hours. Anemia Panel: No results for input(s): VITAMINB12, FOLATE, FERRITIN, TIBC, IRON, RETICCTPCT in the last 72 hours. Urine analysis:    Component Value Date/Time   COLORURINE AMBER (A) 05/28/2015 1529   APPEARANCEUR CLOUDY (A) 05/28/2015 1529   LABSPEC 1.021 05/28/2015 1529   PHURINE 5.5 05/28/2015 1529   GLUCOSEU >1000 (A) 05/28/2015 1529   HGBUR NEGATIVE 05/28/2015 1529   BILIRUBINUR SMALL (A) 05/28/2015 1529   KETONESUR NEGATIVE 05/28/2015 1529   PROTEINUR 30 (A) 05/28/2015 1529   UROBILINOGEN 1.0 01/17/2015  1627   NITRITE NEGATIVE 05/28/2015 1529   LEUKOCYTESUR NEGATIVE 05/28/2015 1529   Sepsis Labs: '@LABRCNTIP'$ (procalcitonin:4,lacticidven:4) ) Recent Results (from the past 240 hour(s))  Culture, respiratory (NON-Expectorated)     Status: None   Collection Time: 05/21/16  9:32 AM  Result Value Ref Range Status   Specimen Description SPUTUM  Final   Special Requests NONE  Final   Gram Stain   Final    ABUNDANT WBC PRESENT,BOTH PMN AND MONONUCLEAR RARE GRAM POSITIVE RODS FEW YEAST Performed at [place]   Final   Report Status 05/23/2016 FINAL  Final   Organism ID, Bacteria ESCHERICHIA COLI  Final      Susceptibility   Escherichia coli - MIC*    AMPICILLIN 8 SENSITIVE Sensitive     CEFAZOLIN <=4 SENSITIVE Sensitive     CEFEPIME <=1 SENSITIVE Sensitive     CEFTAZIDIME <=1 SENSITIVE Sensitive     CEFTRIAXONE <=1 SENSITIVE Sensitive     CIPROFLOXACIN <=0.25 SENSITIVE Sensitive     GENTAMICIN <=1 SENSITIVE Sensitive     IMIPENEM <=0.25 SENSITIVE Sensitive     TRIMETH/SULFA <=20 SENSITIVE Sensitive     AMPICILLIN/SULBACTAM 4 SENSITIVE Sensitive     PIP/TAZO <=4 SENSITIVE Sensitive     Extended ESBL NEGATIVE Sensitive     * FEW ESCHERICHIA COLI  Culture, expectorated sputum-assessment     Status: None   Collection Time: 05/21/16  9:51 AM  Result Value Ref Range Status   Specimen Description SPUTUM  Final   Special Requests Normal  Final   Sputum evaluation   Final    THIS SPECIMEN IS ACCEPTABLE. RESPIRATORY CULTURE REPORT TO FOLLOW.   Report Status 05/21/2016 FINAL  Final     Radiological Exams on Admission: Dg Chest 2 View  Result Date: 05/30/2016 CLINICAL DATA:  Patient with cough and shortness of breath. EXAM: CHEST  2 VIEW COMPARISON:  Chest radiograph 05/24/2016. FINDINGS: Stable cardiac and mediastinal contours. Extensive emphysematous change. Unchanged consolidation within the right lower lung. No definite  pleural effusion or pneumothorax. Aortic atherosclerosis. Osseous demineralization. IMPRESSION: Similar appearing consolidation within the right lower lung concerning for pneumonia in the appropriate clinical setting. Followup PA and lateral chest X-ray is recommended in 3-4 weeks following trial of antibiotic therapy to ensure resolution and exclude underlying malignancy. Electronically Signed   By: Lovey Newcomer M.D.   On: 05/30/2016 13:07   Dg Thoracic Spine 2 View  Result Date: 05/30/2016 CLINICAL DATA:  Mid scapular pain. Shortness of breath, cough, severe pain between shoulder blades. EXAM: THORACIC SPINE 2 VIEWS COMPARISON:  Chest x-ray today.  Chest CT 05/09/2016. FINDINGS: Diffuse osteopenia. Mild compression fracture in the lower thoracic spine, likely T12, stable since prior study. Mild compression fracture at T7, new since prior CT. Normal alignment. IMPRESSION: Mild chronic T12 compression fracture.  Mild T7 compression fracture, new since 05/09/2016 Diffuse osteopenia. Electronically Signed   By: Rolm Baptise M.D.   On: 05/30/2016 16:49    EKG: Independently reviewed.  Assessment/Plan Principal Problem:   Recurrent aspiration pneumonia ([name]) Active Problems:   DM (diabetes mellitus), type 2, uncontrolled ([place])   COPD (chronic obstructive pulmonary disease) ([place])   CAP (community acquired pneumonia)   Dysphagia with aspiration   Acute on chronic respiratory failure with hypoxia ([name])   Aspiration pneumonia of right lower lobe ([place])    1. Recurrent aspiration PNA of the RLL - given elevation in WBC and worsening of symptoms despite augmentin (which e.coli last week was sensitive to) I suspect another aspiration PNA. 1. PNA pathway 2. Will put patient on rocephin / azithromycin for now as she isnt really septic at this point, broaden this to cefepime and vanc if she becomes septic / worsens / etc. 3. Repeat sputum cultures 4. I let patients pulmonologist Dr. [place] Bells know that she  is being re-admitted, he happens to be on for [facility] tomorrow for PCCM. 5. SLP eval and treat 1. Dys 3 diet with nectar thick liquids 2. Did have discussion about recurrent aspiration, use vs non-use of thickener in morning coffee, etc 6. Pain control of pleurisy with oxy 2. Acute on chronic resp failure with hypoxia - O2 via [place] 3. COPD - 1. Continue home meds 2. Adult wheeze protocol 3. Scattered wheezes, but I dont see a reason to up the steroids at the moment (especially since they are causing hyperglycemia with her DM).  Will leave her on the '30mg'$  daily dose of the taper that she is currently on unless she worsens or pulm wants to up these tomorrow. 4. She remains quit on the smoking front. 4. DM2 - 1. Continue lantus 10 QHS 2. Mod scale SSI AC 3. 5 units now and re check in 1 hr (may very well need more due to current BGL 482 but want to avoid lows, she had low BGL last admit).   DVT prophylaxis: Lovenox Code Status: Full Family Communication: Husband at bedside Consults called: Let Dr. [place] Bells know of patients re-admission Admission status: Admit to inpatient   Etta Quill DO [facility] Pager [phone] from 7PM-7AM  If 7AM-7PM, please contact the day physician for the patient [email or web address] Password [facility]  05/30/2016, 8:09 PM

## 2016-05-30 NOTE — ED Notes (Signed)
Could only get enough blood for CBC with unsuccessful IV attempt

## 2016-05-30 NOTE — ED Provider Notes (Signed)
  Face-to-face evaluation   History: She presents for evaluation of worsening shortness of breath, despite being treated for pneumonia with Augmentin, prednisone, after recent hospitalization.  Physical exam: Frail, elderly-appearing female, who is uncomfortable. She has increased pain when sitting in the mid upper back. There is decreased air movement bilaterally, while on the nebulizer. There are a few scattered wheezes. She is not in respiratory distress  Medical screening examination/treatment/procedure(s) were conducted as a shared visit with non-physician practitioner(s) and myself.  I personally evaluated the patient during the encounter    Daleen Bo, MD 06/02/16 2011

## 2016-05-30 NOTE — ED Notes (Signed)
Hospitalist at bedside 

## 2016-05-30 NOTE — ED Notes (Signed)
Ambulated patient with pulse oximetry device, upon ambulation patient reports feeling dizzy and O2 saturation dropped from 96% to 82%-90% with an erratic waveform. 91% saturation was observed with an acceptable waveform. However, patient states her normal ambulating oxygen saturation is around 84%.

## 2016-05-30 NOTE — ED Notes (Signed)
ED Provider at bedside. 

## 2016-05-30 NOTE — ED Triage Notes (Addendum)
Patient reports she was dx with pneumonia 1 week ago and is currently still on antibiotics. Patient is complaining of increasing shortness of breath x3 days. Denies chest pain. Hx of COPD and patient is normally on 2 L [place] at home.

## 2016-05-31 ENCOUNTER — Inpatient Hospital Stay (HOSPITAL_COMMUNITY): Payer: Medicare Other

## 2016-05-31 ENCOUNTER — Encounter (HOSPITAL_COMMUNITY): Payer: Self-pay | Admitting: General Surgery

## 2016-05-31 DIAGNOSIS — J690 Pneumonitis due to inhalation of food and vomit: Principal | ICD-10-CM

## 2016-05-31 DIAGNOSIS — M4850XA Collapsed vertebra, not elsewhere classified, site unspecified, initial encounter for fracture: Secondary | ICD-10-CM

## 2016-05-31 DIAGNOSIS — J9621 Acute and chronic respiratory failure with hypoxia: Secondary | ICD-10-CM

## 2016-05-31 LAB — BASIC METABOLIC PANEL
Anion gap: 7 (ref 5–15)
BUN: 22 mg/dL — AB (ref 6–20)
CO2: 34 mmol/L — ABNORMAL HIGH (ref 22–32)
Calcium: 8.7 mg/dL — ABNORMAL LOW (ref 8.9–10.3)
Chloride: 93 mmol/L — ABNORMAL LOW (ref 101–111)
Creatinine, Ser: 0.65 mg/dL (ref 0.44–1.00)
GFR calc Af Amer: >60 mL/min (ref >60)
GLUCOSE: 197 mg/dL — AB (ref 65–99)
POTASSIUM: 4.6 mmol/L (ref 3.5–5.1)
Sodium: 134 mmol/L — ABNORMAL LOW (ref 135–145)

## 2016-05-31 LAB — GLUCOSE, CAPILLARY
Glucose-Capillary: 148 mg/dL — ABNORMAL HIGH (ref 65–99)
Glucose-Capillary: 157 mg/dL — ABNORMAL HIGH (ref 65–99)
Glucose-Capillary: 265 mg/dL — ABNORMAL HIGH (ref 65–99)
Glucose-Capillary: 387 mg/dL — ABNORMAL HIGH (ref 65–99)

## 2016-05-31 LAB — CBC
HCT: 32.3 % — ABNORMAL LOW (ref 36.0–46.0)
Hemoglobin: 10.8 g/dL — ABNORMAL LOW (ref 12.0–15.0)
MCH: 29.9 pg (ref 26.0–34.0)
MCHC: 33.4 g/dL (ref 30.0–36.0)
MCV: 89.5 fL (ref 78.0–100.0)
PLATELETS: 243 10*3/uL (ref 150–400)
RBC: 3.61 MIL/uL — AB (ref 3.87–5.11)
RDW: 16.2 % — AB (ref 11.5–15.5)
WBC: 8.4 10*3/uL (ref 4.0–10.5)

## 2016-05-31 LAB — HIV ANTIBODY (ROUTINE TESTING W REFLEX): HIV Screen 4th Generation wRfx: NONREACTIVE

## 2016-05-31 MED ORDER — ENSURE ENLIVE PO LIQD
237.0000 mL | Freq: Two times a day (BID) | ORAL | Status: DC
  Administered 2016-06-01 – 2016-06-03 (×4): 237 mL via ORAL

## 2016-05-31 MED ORDER — KETOROLAC TROMETHAMINE 30 MG/ML IJ SOLN
30.0000 mg | Freq: Four times a day (QID) | INTRAMUSCULAR | Status: DC | PRN
  Administered 2016-05-31 – 2016-06-03 (×7): 30 mg via INTRAVENOUS
  Filled 2016-05-31 (×7): qty 1

## 2016-05-31 NOTE — Evaluation (Signed)
Clinical/Bedside Swallow Evaluation Patient Details  Name: Brenda [name] MRN: [id number] Date of Birth: [birth year]  Today's Date: 05/31/2016 Time: SLP Start Time (ACUTE ONLY): 1051 SLP Stop Time (ACUTE ONLY): 1110 SLP Time Calculation (min) (ACUTE ONLY): 19 min  Past Medical History:  Past Medical History:  Diagnosis Date  . Arthritis    "hands" (11/26/2012)  . CAD S/P percutaneous coronary angioplasty 1999; 2001;2003, 10/2014   a) BMS- mCx; b) re-do PCI for prog of Dz (NIR BMS 3.5 x 12); c) staged PCI: p-mRCA Express II BMS 3.0x24 & 3.5 x 16 --> p-mLAD Cyper DES 3.0 x 13 & PTCA of D2 (2.5 balloon); 10/2015 PCI p-m LAD Promus DES 2.75 x 38 (covers pre&post-stent ISR), POBA of ostial rPDA  . Cancer of base of tongue ([place]) 2010   "& lymph nodes @ right neck" (11/26/2012  . Carotid artery occlusion   . Chronic bronchitis ([place])   . COPD (chronic obstructive pulmonary disease) with emphysema ([place])   . Depression   . GERD (gastroesophageal reflux disease)   . H/O hiatal hernia   . History of blood transfusion   . History of DVT (deep vein thrombosis)   . Hx of radiation therapy   . Hx of radiation therapy 01/16/09 - 03/06/09   base of tongue, right neck node  . Hypercholesteremia   . Myocardial infarction 1999, 2003  . PAD (peripheral artery disease) ([name])    status post bilateral femoropopliteal bypass grafting --> Dr. Sherren Mocha Early; -  02/2013 Revision of R Fem-AKPop to BK Pop.  . Patent foramen ovale February 2013   Small. Small PFO noted on TTE for TIA/CVA R-L + Bubble study with valsalva  . Pneumonia   . Stroke [facility])    left side weakness remains (11/26/2012)  . Transient ischemic attack (TIA) February 2013  . Type II diabetes mellitus ([place])    Past Surgical History:  Past Surgical History:  Procedure Laterality Date  . ANKLE FRACTURE SURGERY Right 2007   "got a metal plate and 8 screws in it" (11/26/2012)  . APPENDECTOMY  1962  . BYPASS GRAFT POPLITEAL TO POPLITEAL Right 02/05/2013    Procedure: BYPASS GRAFT ABOVE KNEE POPLITEAL TO BELOW KNEE POPLITEAL WITH SMALL SAPHANOUS VEIN;  Surgeon: Rosetta Posner, MD;  Location: [place];  Service: Vascular;  Laterality: Right;  . CARDIAC CATHETERIZATION  1999; 10/2005   5/'07: mildLAD 20-30%, patent stent, diags- no Dz. RCA 2 BMS patent ~20% ISR.   Marland Kitchen CARDIAC CATHETERIZATION N/A 10/06/2015   Procedure: Left Heart Cath and Coronary Angiography;  Surgeon: Leonie Man, MD;  Location: [place] CV LAB;  Service: Cardiovascular; p-mLAD 99% pre-stent, 60% ISR & 80% post-stent, o-RPDA 90%, ~10% ISR in 2 RCA (Prox & mid) BMS,~20% ISR in pCx BMS with 5% OM2 BMS.    Marland Kitchen CARDIAC CATHETERIZATION N/A 10/06/2015   Procedure: Coronary Stent Intervention;  Surgeon: Leonie Man, MD;  Location: [place] CV LAB;  Service: Cardiovascular; p-m LAD Promus DES 2.75 x 38 (tapered post-dilatoin)  . CARDIAC CATHETERIZATION N/A 10/06/2015   Procedure: Coronary Balloon Angioplasty;  Surgeon: Leonie Man, MD;  Location: [place] CV LAB;  Service: Cardiovascular;  POBA of Ostial rPDA.  Marland Kitchen CARPAL TUNNEL RELEASE Right 1980's?  Marland Kitchen CATARACT EXTRACTION W/ INTRAOCULAR LENS IMPLANT Right 2011  . [place]  . CORONARY ANGIOPLASTY WITH STENT PLACEMENT  1999--2005   1999 - OM2 BMS; 2001: p-mCx NIR BMS 3.5 x 12; 10/2001: p-mRCA Express II  BMS - 3.5 x 16 - 3.0 x 24.   . ESOPHAGOGASTRODUODENOSCOPY  12/20/2011   Procedure: ESOPHAGOGASTRODUODENOSCOPY (EGD);  Surgeon: Beryle Beams, MD;  Location: [facility];  Service: Endoscopy;  Laterality: N/A;  EGD with balloon dilation  . EYE SURGERY    . FEMORAL BYPASS  1999; 2000   Bilateral Fem-AK POP bypass.   Marland Kitchen PERIPHERAL VASCULAR CATHETERIZATION N/A 12/01/2015   Procedure: Abdominal Aortogram w/Lower Extremity;  Surgeon: Elam Dutch, MD;  Location: [place] CV LAB;  Service: Cardiovascular;  Laterality: N/A;  . PERIPHERAL VASCULAR CATHETERIZATION Right 12/22/2015   Procedure: Peripheral Vascular Intervention;   Surgeon: Rosetta Posner, MD;  Location: [place] CV LAB;  Service: Cardiovascular;  Laterality: Right;  EXternal iliac  . SAVORY DILATION  12/20/2011   Procedure: SAVORY DILATION;  Surgeon: Beryle Beams, MD;  Location: [facility];  Service: Endoscopy;  Laterality: N/A;  . TEE WITHOUT CARDIOVERSION  07/26/2011   Procedure: TRANSESOPHAGEAL ECHOCARDIOGRAM (TEE);  Surgeon: Pixie Casino, MD;  Location: [facility] ENDOSCOPY;  Service: Cardiovascular;  Laterality: N/A;  . TONSILLECTOMY  1969  . TRANSTHORACIC ECHOCARDIOGRAM  March 2016   Normal LV size and function EF 55-60%. No regional wall motion and amount is. No significant valvular lesions.  . TUBAL LIGATION  1988  . VAGINAL HYSTERECTOMY  1988   HPI:  65 y.o. female with medical history significant of COPD, CAD with stents, dysphagia with recurrent aspiration pneumonia.  She had been doing well with regards to dysphagia on a Dys 3 diet and nectar thick liquids (other than her AM coffee)  until she "slacked off" on this in October.  Since then she has been admitted 3 previous times with admission on 05/30/16 making 4 for recurrent aspiration pneumonia of the RLL also with COPD exacerbation; pt c/o esophageal symptoms including regurgitation during meals and "fullness with meals."  She has a significant ST hx with independent use of swallowing strategies and knowledge of importance of thick liquids to prevent aspiration.   Assessment / Plan / Recommendation Clinical Impression   Pt with extensive oropharyngeal/esophageal dysphagia hx; using compensatory strategies of effortful swallow, repetitive swallows, small bites/sips and no straws with minimal-no verbal cueing from SLP during BSE; pt aware of need for thick liquids, she stated she just "slacked off" from using because she felt she was doing better with her swallowing.  Pt tolerated nectar-thick liquids and puree/soft solids without s/s of aspiration noted during small amount of po intake; recommend  Dysphagia 2 (minced) diet with nectar-thickened liquids utilizing aspiration precautions/swallowing strategies. ST will f/u while in house; D/C plan TBD d/t pt dysphagia hx.    Aspiration Risk  Moderate aspiration risk    Diet Recommendation   Dysphagia 2/nectar-thick liquids (small amounts)  Medication Administration: Crushed with puree    Other  Recommendations Oral Care Recommendations: Oral care prior to ice chip/H20;Oral care BID Other Recommendations: Prohibited food (jello, ice cream, thin soups);Order thickener from pharmacy;Remove water pitcher   Follow up Recommendations Other (comment) (TBD)      Frequency and Duration min 1 x/week  1 week       Prognosis Prognosis for Safe Diet Advancement: Fair Barriers to Reach Goals: Severity of deficits      Swallow Study   General Date of Onset: 05/30/16 HPI: 65 y.o. female with medical history significant of COPD, CAD with stents, dysphagia with recurrent aspiration pneumonia.  She had been doing well with regards to dysphagia on a Dys 3 diet and  nectar thick liquids (other than her AM coffee).  Until she "slacked off" on this in October.  Since then she has been admitted 3 previous times (today makes #4) for recurrent aspiration pneumonia of the RLL also with COPD exacerbation Type of Study: Bedside Swallow Evaluation Previous Swallow Assessment:  (MBS 07/10/15 recommended Dys 3/nectar-thick liquids) Diet Prior to this Study: Dysphagia 3 (soft);Nectar-thick liquids Temperature Spikes Noted: No Respiratory Status: Nasal cannula History of Recent Intubation: No Behavior/Cognition: Alert;Cooperative;Distractible;Other (Comment) (pain is a 7 in back) Oral Cavity Assessment: Within Functional Limits Oral Care Completed by SLP: No Oral Cavity - Dentition: Dentures, top;Dentures, bottom Vision: Functional for self-feeding Self-Feeding Abilities: Able to feed self Patient Positioning: Upright in bed;Other (comment) (pain impedes  upright positioning; tolerated minimally ) Baseline Vocal Quality: Hoarse;Wet Volitional Cough: Strong Volitional Swallow: Able to elicit    Oral/Motor/Sensory Function Overall Oral Motor/Sensory Function: Mild impairment   Ice Chips Ice chips: Not tested   Thin Liquid Thin Liquid: Not tested (d/t dysphagia hx)    Nectar Thick Nectar Thick Liquid: Within functional limits Presentation: Cup;Self Fed Pharyngeal Phase Impairments: Other (comments) (effortful swallow; small sips utilized independently)   Honey Thick Honey Thick Liquid: Not tested   Puree Puree: Impaired Presentation: Self Fed;Spoon Pharyngeal Phase Impairments: Other (comments) (effortful swallow, small sips)   Solid      Solid: Impaired Presentation: Self Fed Oral Phase Impairments: Impaired mastication;Other (comment) (ill-fitting dentures) Oral Phase Functional Implications: Impaired mastication Pharyngeal Phase Impairments: Multiple swallows;Decreased hyoid-laryngeal movement;Other (comments) (small sips/effortful swallow utilized independently) Other Comments: Pt reports globus sensation/fullness feeling during meals; regurgitates intermittently with meals    Functional Assessment Tool Used: NOMS Functional Limitations: Swallowing Swallow Current Status (B2010): At least 40 percent but less than 60 percent impaired, limited or restricted Swallow Goal Status [phone]): At least 20 percent but less than 40 percent impaired, limited or restricted   Lyndall Windt,PAT, M.S.,CCC-SLP 05/31/2016,11:23 AM

## 2016-05-31 NOTE — Progress Notes (Signed)
Pt arrived to unit room 1517 via stretcher with spouse. Alert and oriented x 4, VS taken and pt oriented to room and callbell with no complications. Unsteady gait, general weakness, SHOB with activity. Pt guide at the bedside. Bed alrm on. Initial assessment completed. Will continue to monitor throughout shift.

## 2016-05-31 NOTE — Consult Note (Signed)
PULMONARY / CRITICAL CARE MEDICINE   Name: Brenda [name] MRN: [id number] DOB: 04-Dec-1950    ADMISSION DATE:  05/30/2016 CONSULTATION DATE:  05/31/2016  REFERRING MD:  Alcario Drought  CHIEF COMPLAINT:  Back pain  HISTORY OF PRESENT ILLNESS:   65 y/o female well known to me for recurrent COPD exacerbations and aspiration pneumonia who was just discharged last week for e.coli pneumonia came back yesterday with severe mid back pain.  She was found to have a compression fracture.  She tells me that her lungs were actually doing better though she had severe pain with cough.  Specifically no worsening dyspnea or wheeze.   PAST MEDICAL HISTORY :  She  has a past medical history of Arthritis; CAD S/P percutaneous coronary angioplasty (1999; 2001;2003, 10/2014); Cancer of base of tongue ([place]) (2010); Carotid artery occlusion; Chronic bronchitis ([name]); COPD (chronic obstructive pulmonary disease) with emphysema ([place]); Depression; GERD (gastroesophageal reflux disease); H/O hiatal hernia; History of blood transfusion; History of DVT (deep vein thrombosis); radiation therapy; radiation therapy (01/16/09 - 03/06/09); Hypercholesteremia; Myocardial infarction (1999, 2003); PAD (peripheral artery disease) ([place]); Patent foramen ovale (February 2013); Pneumonia; Stroke [facility]); Transient ischemic attack (TIA) (February 2013); and Type II diabetes mellitus ([place]).  PAST SURGICAL HISTORY: She  has a past surgical history that includes TEE without cardioversion (07/26/2011); Esophagogastroduodenoscopy (12/20/2011); Savory dilation (12/20/2011); Tonsillectomy (1969); Appendectomy (1962); Cardiac catheterization (1999; 10/2005); Coronary angioplasty with stent (8101--7510); Femoral bypass (1999; 2000); Carpal tunnel release (Right, 1980's?); Vaginal hysterectomy (1988); Cesarean section (1978); Tubal ligation (1988); Cataract extraction w/ intraocular lens implant (Right, 2011); Ankle fracture surgery (Right, 2007); Bypass graft  popliteal to popliteal (Right, 02/05/2013); Eye surgery; Cardiac catheterization (N/A, 10/06/2015); Cardiac catheterization (N/A, 10/06/2015); Cardiac catheterization (N/A, 10/06/2015); transthoracic echocardiogram (March 2016); Cardiac catheterization (N/A, 12/01/2015); and Cardiac catheterization (Right, 12/22/2015).  Allergies  Allergen Reactions  . Tape Hives and Other (See Comments)    USE PAPER TAPE ONLY- Adhesive peels off skin-makes pt. Raw.  . Isosorbide Other (See Comments)    Drops BP too low  . Nicoderm [Nicotine] Rash    To the PATCH only, breakouts on skin    No current facility-administered medications on file prior to encounter.    Current Outpatient Prescriptions on File Prior to Encounter  Medication Sig  . acetaminophen (TYLENOL) 325 MG tablet Take 2 tablets (650 mg total) by mouth every 6 (six) hours as needed. (Patient taking differently: Take 650 mg by mouth every 6 (six) hours as needed for mild pain. )  . albuterol (PROVENTIL HFA;VENTOLIN HFA) 108 (90 BASE) MCG/ACT inhaler Inhale 2 puffs into the lungs every 6 (six) hours as needed for wheezing or shortness of breath.  Marland Kitchen aspirin EC 81 MG tablet Take 1 tablet (81 mg total) by mouth daily.  . bisoprolol (ZEBETA) 5 MG tablet Take 0.5 tablets (2.5 mg total) by mouth daily.  Marland Kitchen buPROPion (WELLBUTRIN XL) 300 MG 24 hr tablet Take 300 mg by mouth daily.   . clopidogrel (PLAVIX) 75 MG tablet Take 1 tablet (75 mg total) by mouth daily.  . cyclobenzaprine (FLEXERIL) 5 MG tablet Take 1-2 tablets (5-10 mg total) by mouth 3 (three) times daily as needed for muscle spasms.  . feeding supplement, GLUCERNA SHAKE, (GLUCERNA SHAKE) LIQD Take 237 mLs by mouth 2 (two) times daily between meals.  . fish oil-omega-3 fatty acids 1000 MG capsule Take 1 g by mouth 2 (two) times daily.   . fluticasone (FLONASE) 50 MCG/ACT nasal spray Place 2 sprays into both nostrils  daily.  . fluticasone furoate-vilanterol (BREO ELLIPTA) 100-25 MCG/INH AEPB Inhale 1  puff into the lungs daily.  Marland Kitchen guaiFENesin (MUCINEX) 600 MG 12 hr tablet Take 1,200 mg by mouth 2 (two) times daily.  Marland Kitchen ibuprofen (ADVIL,MOTRIN) 200 MG tablet Take 400 mg by mouth every 6 (six) hours as needed (Pain).  . insulin glargine (LANTUS) 100 UNIT/ML injection Inject 0.1 mLs (10 Units total) into the skin at bedtime.  . insulin lispro (HUMALOG) 100 UNIT/ML injection Inject 3 Units into the skin 3 (three) times daily with meals as needed for high blood sugar.   . ipratropium-albuterol (DUONEB) 0.5-2.5 (3) MG/3ML SOLN Take 3 mLs by nebulization every 6 (six) hours. (Patient taking differently: Take 3 mLs by nebulization 2 (two) times daily as needed (for wheezingt or shortness of breath). )  . loratadine (CLARITIN) 10 MG tablet Take 1 tablet (10 mg total) by mouth daily.  . Maltodextrin-Xanthan Gum (RESOURCE THICKENUP CLEAR) POWD Use with thin liquids to make nectar thickened consistency  . nitroGLYCERIN (NITROLINGUAL) 0.4 MG/SPRAY spray Place 1 spray under the tongue every 5 (five) minutes as needed for chest pain.  Marland Kitchen omeprazole (PRILOSEC) 20 MG capsule Take 20 mg by mouth daily.   . polyethylene glycol powder (GLYCOLAX/MIRALAX) powder Take 17 g by mouth 2 (two) times daily.  . predniSONE (DELTASONE) 10 MG tablet Take 4 tabs  daily with food x 4 days, then 3 tabs daily x 4 days, then 2 tabs daily x 4 days, then 1 tab daily x4 days then stop. #40  . rosuvastatin (CRESTOR) 20 MG tablet Take 20 mg by mouth daily.  Marland Kitchen SPIRIVA RESPIMAT 2.5 MCG/ACT AERS 2 puffs daily.  . traMADol (ULTRAM) 50 MG tablet Take 1 tablet (50 mg total) by mouth every 6 (six) hours as needed for moderate pain or severe pain.    FAMILY HISTORY:  Her indicated that her mother is deceased. She indicated that her father is deceased. She indicated that the status of her sister is unknown. She indicated that the status of her brother is unknown. She indicated that the status of her son is unknown.    SOCIAL HISTORY: She   reports that she quit smoking about 8 weeks ago. Her smoking use included Cigarettes. She has a 50.00 pack-year smoking history. She has never used smokeless tobacco. She reports that she drinks alcohol. She reports that she does not use drugs.  REVIEW OF SYSTEMS:   Gen: Denies fever, chills, weight change, + fatigue, night sweats HEENT: Denies blurred vision, double vision, hearing loss, tinnitus, sinus congestion, rhinorrhea, sore throat, neck stiffness, dysphagia PULM: per HPI CV: Denies chest pain, edema, orthopnea, paroxysmal nocturnal dyspnea, palpitations GI: Denies abdominal pain, nausea, vomiting, diarrhea, hematochezia, melena, constipation, change in bowel habits GU: Denies dysuria, hematuria, polyuria, oliguria, urethral discharge Endocrine: Denies hot or cold intolerance, polyuria, polyphagia or appetite change Derm: Denies rash, dry skin, scaling or peeling skin change Heme: Denies easy bruising, bleeding, bleeding gums Neuro: Denies headache, numbness, weakness, slurred speech, loss of memory or consciousness  SUBJECTIVE:  Back pain as detailed above  VITAL SIGNS: BP (!) 106/40 (BP Location: Left Arm)   Pulse 72   Temp 98.6 F (37 C) (Oral)   Resp 19   Ht '5\' 5"'$  (1.651 m)   Wt 121 lb 0.5 oz (54.9 kg)   LMP  (LMP Unknown)   SpO2 99%   BMI 20.14 kg/m   HEMODYNAMICS:    VENTILATOR SETTINGS:    INTAKE / OUTPUT:  I/O last 3 completed shifts: In: 300 [IV Piggyback:300] Out: -   PHYSICAL EXAMINATION: General:  Chronically ill appearing Neuro: Awake, alert, no distress HEENT:   NCAT, OP clear Cardiovascular:  RRR, no mgr Lungs:  Crackles R base, some wheezing, good air movement Abdomen:  BS+, soft, nontender Musculoskeletal:  Normal bulk and tone Skin:  Thin skin, some bruising  LABS:  BMET  Recent Labs Lab 05/30/16 1423 05/30/16 1527 05/31/16 0550  NA 131*  --  134*  K 5.4* 5.2* 4.6  CL 88*  --  93*  CO2 36*  --  34*  BUN 19  --  22*  CREATININE  0.86  --  0.65  GLUCOSE 485*  --  197*    Electrolytes  Recent Labs Lab 05/30/16 1423 05/31/16 0550  CALCIUM 9.4 8.7*    CBC  Recent Labs Lab 05/30/16 1410 05/31/16 0550  WBC 16.6* 8.4  HGB 13.3 10.8*  HCT 41.6 32.3*  PLT 240 243    Coag's No results for input(s): APTT, INR in the last 168 hours.  Sepsis Markers No results for input(s): LATICACIDVEN, PROCALCITON, O2SATVEN in the last 168 hours.  ABG No results for input(s): PHART, PCO2ART, PO2ART in the last 168 hours.  Liver Enzymes No results for input(s): AST, ALT, ALKPHOS, BILITOT, ALBUMIN in the last 168 hours.  Cardiac Enzymes  Recent Labs Lab 05/30/16 1423  TROPONINI <0.03    Glucose  Recent Labs Lab 05/30/16 1959 05/31/16 0805 05/31/16 1136  GLUCAP 482* 148* 157*    Imaging Dg Chest 2 View  Result Date: 05/30/2016 CLINICAL DATA:  Patient with cough and shortness of breath. EXAM: CHEST  2 VIEW COMPARISON:  Chest radiograph 05/24/2016. FINDINGS: Stable cardiac and mediastinal contours. Extensive emphysematous change. Unchanged consolidation within the right lower lung. No definite pleural effusion or pneumothorax. Aortic atherosclerosis. Osseous demineralization. IMPRESSION: Similar appearing consolidation within the right lower lung concerning for pneumonia in the appropriate clinical setting. Followup PA and lateral chest X-ray is recommended in 3-4 weeks following trial of antibiotic therapy to ensure resolution and exclude underlying malignancy. Electronically Signed   By: Lovey Newcomer M.D.   On: 05/30/2016 13:07   Dg Thoracic Spine 2 View  Result Date: 05/30/2016 CLINICAL DATA:  Mid scapular pain. Shortness of breath, cough, severe pain between shoulder blades. EXAM: THORACIC SPINE 2 VIEWS COMPARISON:  Chest x-ray today.  Chest CT 05/09/2016. FINDINGS: Diffuse osteopenia. Mild compression fracture in the lower thoracic spine, likely T12, stable since prior study. Mild compression fracture at  T7, new since prior CT. Normal alignment. IMPRESSION: Mild chronic T12 compression fracture. Mild T7 compression fracture, new since 05/09/2016 Diffuse osteopenia. Electronically Signed   By: Rolm Baptise M.D.   On: 05/30/2016 16:49     STUDIES:  05/31/2016 modified barium swallow: dysphagia 2 diet recommended  CULTURES: 05/21/2016 sputum culture Escherichia coli  ANTIBIOTICS: 05/30/2016 ceftriaxone 05/30/2016 azithromycin  SIGNIFICANT EVENTS:   LINES/TUBES:   DISCUSSION: This Feider has near end-stage COPD with recurrent exacerbations and recurrent aspiration pneumonia but she's been hospitalized now for acute back pain due to a compression fracture at T7. This is compromising her ability to cough and clear airway secretions. In general, I actually don't think she's got an acute exacerbation of her COPD or any worsening of her pneumonia but I agree with IV antibiotics. It will take quite some time for her chest x-ray findings to clear considering the severity of her baseline lung disease.  ASSESSMENT / PLAN:  PULMONARY  A: Severe COPD Recent Escherichia coli pneumonia Dysphagia Cough limited by severe back pain from T7 compression fracture P:   Narrow antibiotics to ceftriaxone alone Continue guaifenesin Continue dysphagia diet Continue Breo and when necessary bronchodilators We will consult interventional radiology for kyphoplasty  Roselie Awkward, MD [facility] Pager: [phone] Cell: [phone] After 3pm or if no response, call [phone]   05/31/2016, 11:59 AM

## 2016-05-31 NOTE — Consult Note (Signed)
Chief Complaint: T7 compression fracture  Referring Physician:Dr. Simonne Maffucci  Supervising Physician: Arne Cleveland  Patient Status: [facility] - In-pt  HPI: Brenda [name] is an 65 y.o. 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  She has been coughing a lot secondary to her PNA and woke up from her nap on Christmas day with severe back pain between her shoulder blades.  She is having significant pain simply with breathing, but almost unable to cough secondary to pain.  She had plain films of her T-spine and was found to have a new T7 compression fracture.  We have been asked to see her for possible kyphoplasty.  Past Medical History:  Past Medical History:  Diagnosis Date  . Arthritis    "hands" (11/26/2012)  . CAD S/P percutaneous coronary angioplasty 1999; 2001;2003, 10/2014   a) BMS- mCx; b) re-do PCI for prog of Dz (NIR BMS 3.5 x 12); c) staged PCI: p-mRCA Express II BMS 3.0x24 & 3.5 x 16 --> p-mLAD Cyper DES 3.0 x 13 & PTCA of D2 (2.5 balloon); 10/2015 PCI p-m LAD Promus DES 2.75 x 38 (covers pre&post-stent ISR), POBA of ostial rPDA  . Cancer of base of tongue ([place]) 2010   "& lymph nodes @ right neck" (11/26/2012  . Carotid artery occlusion   . Chronic bronchitis ([place])   . COPD (chronic obstructive pulmonary disease) with emphysema ([place])   . Depression   . GERD (gastroesophageal reflux disease)   . H/O hiatal hernia   . History of blood transfusion   . History of DVT (deep vein thrombosis)   . Hx of radiation therapy   . Hx of radiation therapy 01/16/09 - 03/06/09   base of tongue, right neck node  . Hypercholesteremia   . Myocardial infarction 1999, 2003  . PAD (peripheral artery disease) ([name])    status post bilateral femoropopliteal bypass grafting --> Dr. Sherren Mocha Early; -  02/2013 Revision of R Fem-AKPop to BK Pop.  . Patent foramen ovale  February 2013   Small. Small PFO noted on TTE for TIA/CVA R-L + Bubble study with valsalva  . Pneumonia   . Stroke [facility])    left side weakness remains (11/26/2012)  . Transient ischemic attack (TIA) February 2013  . Type II diabetes mellitus ([place])     Past Surgical History:  Past Surgical History:  Procedure Laterality Date  . ANKLE FRACTURE SURGERY Right 2007   "got a metal plate and 8 screws in it" (11/26/2012)  . APPENDECTOMY  1962  . BYPASS GRAFT POPLITEAL TO POPLITEAL Right 02/05/2013   Procedure: BYPASS GRAFT ABOVE KNEE POPLITEAL TO BELOW KNEE POPLITEAL WITH SMALL SAPHANOUS VEIN;  Surgeon: Rosetta Posner, MD;  Location: [place];  Service: Vascular;  Laterality: Right;  . CARDIAC CATHETERIZATION  1999; 10/2005   5/'07: mildLAD 20-30%, patent stent, diags- no Dz. RCA 2 BMS patent ~20% ISR.   Marland Kitchen CARDIAC CATHETERIZATION N/A 10/06/2015   Procedure: Left Heart Cath and Coronary Angiography;  Surgeon: Leonie Man, MD;  Location: [place] [facility];  Service: Cardiovascular; p-mLAD 99% pre-stent, 60% ISR & 80% post-stent, o-RPDA 90%, ~10% ISR in 2 RCA (Prox & mid) BMS,~20% ISR in pCx BMS with 5% OM2 BMS.    Marland Kitchen CARDIAC CATHETERIZATION N/A 10/06/2015   Procedure: Coronary Stent Intervention;  Surgeon: Leonie Man, MD;  Location: [place] [facility]  [facility];  Service: Cardiovascular; p-m LAD Promus DES 2.75 x 38 (tapered post-dilatoin)  . CARDIAC CATHETERIZATION N/A 10/06/2015   Procedure: Coronary Balloon Angioplasty;  Surgeon: Leonie Man, MD;  Location: [place] [facility];  Service: Cardiovascular;  POBA of Ostial rPDA.  Marland Kitchen CARPAL TUNNEL RELEASE Right 1980's?  Marland Kitchen CATARACT EXTRACTION W/ INTRAOCULAR LENS IMPLANT Right 2011  . [place]  . CORONARY ANGIOPLASTY WITH STENT PLACEMENT  1999--2005   1999 - OM2 BMS; 2001: p-mCx NIR BMS 3.5 x 12; 10/2001: p-mRCA Express II BMS - 3.5 x 16 - 3.0 x 24.   . ESOPHAGOGASTRODUODENOSCOPY  12/20/2011   Procedure: ESOPHAGOGASTRODUODENOSCOPY (EGD);  Surgeon:  Beryle Beams, MD;  Location: [facility];  Service: Endoscopy;  Laterality: N/A;  EGD with balloon dilation  . EYE SURGERY    . FEMORAL BYPASS  1999; 2000   Bilateral Fem-AK POP bypass.   Marland Kitchen PERIPHERAL VASCULAR CATHETERIZATION N/A 12/01/2015   Procedure: Abdominal Aortogram w/Lower Extremity;  Surgeon: Elam Dutch, MD;  Location: [place] [facility];  Service: Cardiovascular;  Laterality: N/A;  . PERIPHERAL VASCULAR CATHETERIZATION Right 12/22/2015   Procedure: Peripheral Vascular Intervention;  Surgeon: Rosetta Posner, MD;  Location: [place] [facility];  Service: Cardiovascular;  Laterality: Right;  EXternal iliac  . SAVORY DILATION  12/20/2011   Procedure: SAVORY DILATION;  Surgeon: Beryle Beams, MD;  Location: [facility];  Service: Endoscopy;  Laterality: N/A;  . TEE WITHOUT CARDIOVERSION  07/26/2011   Procedure: TRANSESOPHAGEAL ECHOCARDIOGRAM (TEE);  Surgeon: Pixie Casino, MD;  Location: [facility] END[facility]COPY;  Service: Cardiovascular;  Laterality: N/A;  . TONSILLECTOMY  1969  . TRANSTHORACIC ECHOCARDIOGRAM  March 2016   Normal LV size and function EF 55-60%. No regional wall motion and amount is. No significant valvular lesions.  . TUBAL LIGATION  1988  . VAGINAL HYSTERECTOMY  1988    Family History:  Family History  Problem Relation Age of Onset  . Cancer Mother     pancreatic  . Diabetes Mother   . Hyperlipidemia Mother   . Hypertension Mother   . Other Mother     varicose vein  . Cancer Father 72    throat  . Heart disease Father   . Heart attack Father   . Cancer Sister     breast  . Diabetes Sister   . Deep vein thrombosis Brother   . Diabetes Brother   . Hearing loss Brother   . Hyperlipidemia Brother   . Hypertension Brother   . Heart attack Brother   . Clotting disorder Brother   . Diabetes Son   . Hyperlipidemia Son     Social History:  reports that she quit smoking about 8 weeks ago. Her smoking use included Cigarettes. She has a 50.00 pack-year smoking  history. She has never used smokeless tobacco. She reports that she drinks alcohol. She reports that she does not use drugs.  Allergies:  Allergies  Allergen Reactions  . Tape Hives and Other (See Comments)    USE PAPER TAPE ONLY- Adhesive peels off skin-makes pt. Raw.  . Isosorbide Other (See Comments)    Drops BP too low  . Nicoderm [Nicotine] Rash    To the PATCH only, breakouts on skin    Medications: Medications reviewed in epic  Please HPI for pertinent positives, otherwise complete 10 system ROS negative, except chronic home O2 use, 2L.     Physical Exam: BP (!) 106/40 (BP Location: Left Arm)  Pulse 72   Temp 98.6 F (37 C) (Oral)   Resp 19   Ht _0  (1.651 m)   Wt 121 lb 0.5 oz (54.9 kg)   LMP  (LMP Unknown)   SpO2 99%   BMI 20.14 kg/m  Body mass index is 20.14 kg/m. General: pleasant, but chronically ill appearing white female who is laying in bed in NAD HEENT: head is normocephalic, atraumatic.  Sclera are noninjected.  PERRL.  Ears and nose without any masses or lesions, but [place] is in place.  Mouth is pink and moist Heart: regular, rate, and rhythm.  Normal s1,s2. No obvious murmurs, gallops, or rubs noted.  Palpable radial pulses bilaterally Lungs: crackles and expiratory wheeze on right.Marland Kitchen  Respiratory effort nonlabored.  [place] in place with 2L Abd: soft, NT, ND, +BS, no masses, hernias, or organomegaly MS: patient is mildly tender between her shoulder blades in her paraspinous muscle area.  She is nontender over her spinous processes. Psych: A&Ox3 with an appropriate affect.   Labs: Results for orders placed or performed during the hospital encounter of 05/30/16 (from the past 48 hour(s))  CBC with Differential     Status: Abnormal   Collection Time: 05/30/16  2:10 PM  Result Value Ref Range   WBC 16.6 (H) 4.0 - 10.5 K/uL   RBC 4.55 3.87 - 5.11 MIL/uL   Hemoglobin 13.3 12.0 - 15.0 g/dL   HCT 41.6 36.0 - 46.0 %   MCV 91.4 78.0 - 100.0 fL   MCH 29.2 26.0 -  34.0 pg   MCHC 32.0 30.0 - 36.0 g/dL   RDW 16.2 (H) 11.5 - 15.5 %   Platelets 240 150 - 400 K/uL   Neutrophils Relative % 95 %   Neutro Abs 15.7 (H) 1.7 - 7.7 K/uL   Lymphocytes Relative 3 %   Lymphs Abs 0.5 (L) 0.7 - 4.0 K/uL   Monocytes Relative 2 %   Monocytes Absolute 0.4 0.1 - 1.0 K/uL   Eosinophils Relative 0 %   Eosinophils Absolute 0.0 0.0 - 0.7 K/uL   Basophils Relative 0 %   Basophils Absolute 0.0 0.0 - 0.1 K/uL  Basic metabolic panel     Status: Abnormal   Collection Time: 05/30/16  2:23 PM  Result Value Ref Range   Sodium 131 (L) 135 - 145 mmol/L   Potassium 5.4 (H) 3.5 - 5.1 mmol/L   Chloride 88 (L) 101 - 111 mmol/L   CO2 36 (H) 22 - 32 mmol/L   Glucose, Bld 485 (H) 65 - 99 mg/dL   BUN 19 6 - 20 mg/dL   Creatinine, Ser 0.86 0.44 - 1.00 mg/dL   Calcium 9.4 8.9 - 10.3 mg/dL   GFR calc non Af Amer >60 >60 mL/min   GFR calc Af Amer >60 >60 mL/min    Comment: (NOTE) The eGFR has been calculated using the CKD EPI equation. This calculation has not been validated in all clinical situations. eGFR's persistently <60 mL/min signify possible Chronic Kidney Disease.    Anion gap 7 5 - 15  Troponin I     Status: None   Collection Time: 05/30/16  2:23 PM  Result Value Ref Range   Troponin I <0.03 <0.03 ng/mL  Potassium     Status: Abnormal   Collection Time: 05/30/16  3:27 PM  Result Value Ref Range   Potassium 5.2 (H) 3.5 - 5.1 mmol/L  CBG monitoring, ED     Status: Abnormal   Collection Time: 05/30/16  7:59 PM  Result Value Ref Range   Glucose-Capillary 482 (H) 65 - 99 mg/dL  HIV antibody     Status: None   Collection Time: 05/30/16  8:34 PM  Result Value Ref Range   HIV Screen 4th Generation wRfx Non Reactive Non Reactive    Comment: (NOTE) Performed At: [facility] [place], [place] [id number] Lindon Romp MD GE:[id number]   Strep pneumoniae urinary antigen     Status: None   Collection Time: 05/30/16  9:12 PM  Result Value Ref  Range   Strep Pneumo Urinary Antigen NEGATIVE NEGATIVE    Comment:        Infection due to S. pneumoniae cannot be absolutely ruled out since the antigen present may be below the detection limit of the test. Performed at [facility]   CBC     Status: Abnormal   Collection Time: 05/31/16  5:50 AM  Result Value Ref Range   WBC 8.4 4.0 - 10.5 K/uL   RBC 3.61 (L) 3.87 - 5.11 MIL/uL   Hemoglobin 10.8 (L) 12.0 - 15.0 g/dL   HCT 32.3 (L) 36.0 - 46.0 %   MCV 89.5 78.0 - 100.0 fL   MCH 29.9 26.0 - 34.0 pg   MCHC 33.4 30.0 - 36.0 g/dL   RDW 16.2 (H) 11.5 - 15.5 %   Platelets 243 150 - 400 K/uL  Basic metabolic panel     Status: Abnormal   Collection Time: 05/31/16  5:50 AM  Result Value Ref Range   Sodium 134 (L) 135 - 145 mmol/L   Potassium 4.6 3.5 - 5.1 mmol/L   Chloride 93 (L) 101 - 111 mmol/L   CO2 34 (H) 22 - 32 mmol/L   Glucose, Bld 197 (H) 65 - 99 mg/dL   BUN 22 (H) 6 - 20 mg/dL   Creatinine, Ser 0.65 0.44 - 1.00 mg/dL   Calcium 8.7 (L) 8.9 - 10.3 mg/dL   GFR calc non Af Amer >60 >60 mL/min   GFR calc Af Amer >60 >60 mL/min    Comment: (NOTE) The eGFR has been calculated using the CKD EPI equation. This calculation has not been validated in all clinical situations. eGFR's persistently <60 mL/min signify possible Chronic Kidney Disease.    Anion gap 7 5 - 15  Glucose, capillary     Status: Abnormal   Collection Time: 05/31/16  8:05 AM  Result Value Ref Range   Glucose-Capillary 148 (H) 65 - 99 mg/dL   Comment 1 Notify RN    Comment 2 Document in Chart   Glucose, capillary     Status: Abnormal   Collection Time: 05/31/16 11:36 AM  Result Value Ref Range   Glucose-Capillary 157 (H) 65 - 99 mg/dL   Comment 1 Notify RN    Comment 2 Document in Chart     Imaging: Dg Chest 2 View  Result Date: 05/30/2016 CLINICAL DATA:  Patient with cough and shortness of breath. EXAM: CHEST  2 VIEW COMPARISON:  Chest radiograph 05/24/2016. FINDINGS: Stable cardiac and  mediastinal contours. Extensive emphysematous change. Unchanged consolidation within the right lower lung. No definite pleural effusion or pneumothorax. Aortic atherosclerosis. Osseous demineralization. IMPRESSION: Similar appearing consolidation within the right lower lung concerning for pneumonia in the appropriate clinical setting. Followup PA and lateral chest X-ray is recommended in 3-4 weeks following trial of antibiotic therapy to ensure resolution and exclude underlying malignancy. Electronically Signed   By: Lovey Newcomer M.D.   On:  05/30/2016 13:07   Dg Thoracic Spine 2 View  Result Date: 05/30/2016 CLINICAL DATA:  Mid scapular pain. Shortness of breath, cough, severe pain between shoulder blades. EXAM: THORACIC SPINE 2 VIEWS COMPARISON:  Chest x-ray today.  Chest CT 05/09/2016. FINDINGS: Diffuse osteopenia. Mild compression fracture in the lower thoracic spine, likely T12, stable since prior study. Mild compression fracture at T7, new since prior CT. Normal alignment. IMPRESSION: Mild chronic T12 compression fracture. Mild T7 compression fracture, new since 05/09/2016 Diffuse osteopenia. Electronically Signed   By: Rolm Baptise M.D.   On: 05/30/2016 16:49    Assessment/Plan 1. T7 compression fracture -the patient will need an MRI of her thoracic spine to determine if she is a candidate, initially, for a KP.  If she is, her plavix will need to be held for 5 days prior to proceeding.  She will also need insurance authorization which can not even begin until next week as well.  If her MRI shows favorably for a KP, the earlier we could possibly proceed would be Wednesday, with the possibility being later pending all of these other issues.  All of this has been discussed with the patient and her husband who both understand all of these things.  We will follow after her MRI is completed.  Thank you for this interesting consult.  I greatly enjoyed meeting Brenda [name] and look forward to  participating in their care.  A copy of this report was sent to the requesting provider on this date.  Electronically Signed: Henreitta Cea 05/31/2016, 2:12 PM   I spent a total of 40 Minutes  in face to face in clinical consultation, greater than 50% of which was counseling/coordinating care for T7 compression fracture

## 2016-05-31 NOTE — Progress Notes (Signed)
PROGRESS NOTE    Brenda [name]  WYO:[id number] DOB: [birth year] DOA: 05/30/2016 PCP: [name], MD    Brief Narrative:  65 y.o. female with medical history significant of COPD, CAD with stents, dysphagia with recurrent aspiration pneumonia.  She had been doing well with regards to dysphagia on a Dys 3 diet and nectar thick liquids (other than her AM coffee).  Until she "slacked off" on this in October.  Since then she has been admitted 3 previous times (today makes #4) for recurrent aspiration pneumonia of the RLL also with COPD exacerbation.  She reports she quit smoking on Nov 11th.  Last admit was just last week, discharged 6 days ago on Augmentin for RLL PNA sputum cultures last week grew out E.Coli that was pan-sensitive.  She reports no specific aspiration event (but says she never does know when she aspirates).  But she reports increasing SOB over the past 3 days.  Severe back pain with cough.  Symptoms persistent and worsening.  Denies fever.  Assessment & Plan:   Principal Problem:   Recurrent aspiration pneumonia ([place]) Active Problems:   DM (diabetes mellitus), type 2, uncontrolled ([place])   COPD (chronic obstructive pulmonary disease) ([place])   CAP (community acquired pneumonia)   Dysphagia with aspiration   Acute on chronic respiratory failure with hypoxia ([name])   Aspiration pneumonia of right lower lobe ([place])   1. Recurrent aspiration PNA of the RLL 1. Patient with elevation in WBC and worsening of symptoms despite augmentin 2. Patient started on rocephin / azithromycin 3. Pulmonary following with recs to narrow to rocephin alone 4. Repeat sputum cultures pending 5. Continued on Dys 3 diet with nectar thick liquids 6. Cont O2 as needed 2. Acute on chronic resp failure with hypoxia 1. Will continue O2 via [place] 3. COPD 1. Continue home meds 2. End-expiratory wheezing on auscultation 3. Continue bronchodilators as needed 4. DM2  1. Continue lantus 10 QHS  fornow 2. Mod scale SSI AC 3. Titrate insulin accordingly 5. CAD 1. Stable at present 2. No chest pain 3. Per below, hold plavix until OK to do so per IR 6. T7 compression fracture 1. MRI in progress 2. Discussed with IR. Tentative plans for kyphoplasty next week 3. Discussed with Cardiology. Patient is past 59mo from last stent placement, thus OK to hold plavix for the time being for procedure. 4. Would resume plavix when OK to do so per IR 5. Cont analgesics as needed  DVT prophylaxis: Lovenox Code Status: Full Family Communication: Pt in room, family not at bedside Disposition Plan: Uncertain at this time  Consultants:   Pulmonary   IR  Procedures:     Antimicrobials: Anti-infectives    Start     Dose/Rate Route Frequency Ordered Stop   05/31/16 2000  cefTRIAXone (ROCEPHIN) 1 g in dextrose 5 % 50 mL IVPB     1 g 100 mL/hr over 30 Minutes Intravenous Every 24 hours 05/30/16 1954     05/30/16 2000  azithromycin (ZITHROMAX) 500 mg in dextrose 5 % 250 mL IVPB     500 mg 250 mL/hr over 60 Minutes Intravenous Every 24 hours 05/30/16 1954     05/30/16 1930  cefTRIAXone (ROCEPHIN) 1 g in dextrose 5 % 50 mL IVPB  Status:  Discontinued     1 g 100 mL/hr over 30 Minutes Intravenous  Once 05/30/16 1926 05/30/16 1945   05/30/16 1930  cefTRIAXone (ROCEPHIN) 1 g in dextrose 5 % 50 mL IVPB  1 g 100 mL/hr over 30 Minutes Intravenous  Once 05/30/16 1926 05/30/16 2021       Subjective: Complains of back pain  Objective: Vitals:   05/30/16 2146 05/30/16 2344 05/31/16 0507 05/31/16 1420  BP: (!) 83/51  (!) 106/40 121/81  Pulse: 78  72 82  Resp: '20  19 18  '$ Temp: 97.6 F (36.4 C)  98.6 F (37 C) 97.6 F (36.4 C)  TempSrc: Oral  Oral Oral  SpO2: 99% 97% 99% 94%  Weight: 54.9 kg (121 lb 0.5 oz)     Height: '5\' 5"'$  (1.651 m)       Intake/Output Summary (Last 24 hours) at 05/31/16 1612 Last data filed at 05/31/16 8546  Gross per 24 hour  Intake              420 ml   Output                0 ml  Net              420 ml   Filed Weights   05/30/16 1238 05/30/16 2146  Weight: 60.3 kg (133 lb) 54.9 kg (121 lb 0.5 oz)    Examination:  General exam: Appears calm and comfortable  Respiratory system: end-expiratory wheezing on auscultation. Respiratory effort normal. Cardiovascular system: S1 & S2 heard, RRR Gastrointestinal system: Abdomen is nondistended, soft and nontender. No organomegaly or masses felt. Normal bowel sounds heard. Central nervous system: Alert and oriented. No focal neurological deficits. Extremities: Symmetric 5 x 5 power. Skin: No rashes, lesions  Psychiatry: Judgement and insight appear normal. Mood & affect appropriate.   Data Reviewed: I have personally reviewed following labs and imaging studies  CBC:  Recent Labs Lab 05/30/16 1410 05/31/16 0550  WBC 16.6* 8.4  NEUTROABS 15.7*  --   HGB 13.3 10.8*  HCT 41.6 32.3*  MCV 91.4 89.5  PLT 240 270   Basic Metabolic Panel:  Recent Labs Lab 05/30/16 1423 05/30/16 1527 05/31/16 0550  NA 131*  --  134*  K 5.4* 5.2* 4.6  CL 88*  --  93*  CO2 36*  --  34*  GLUCOSE 485*  --  197*  BUN 19  --  22*  CREATININE 0.86  --  0.65  CALCIUM 9.4  --  8.7*   GFR: Estimated Creatinine Clearance: 60.8 mL/min (by C-G formula based on SCr of 0.65 mg/dL). Liver Function Tests: No results for input(s): AST, ALT, ALKPHOS, BILITOT, PROT, ALBUMIN in the last 168 hours. No results for input(s): LIPASE, AMYLASE in the last 168 hours. No results for input(s): AMMONIA in the last 168 hours. Coagulation Profile: No results for input(s): INR, PROTIME in the last 168 hours. Cardiac Enzymes:  Recent Labs Lab 05/30/16 1423  TROPONINI <0.03   BNP (last 3 results) No results for input(s): PROBNP in the last 8760 hours. HbA1C: No results for input(s): HGBA1C in the last 72 hours. CBG:  Recent Labs Lab 05/30/16 1959 05/31/16 0805 05/31/16 1136  GLUCAP 482* 148* 157*   Lipid  Profile: No results for input(s): CHOL, HDL, LDLCALC, TRIG, CHOLHDL, LDLDIRECT in the last 72 hours. Thyroid Function Tests: No results for input(s): TSH, T4TOTAL, FREET4, T3FREE, THYROIDAB in the last 72 hours. Anemia Panel: No results for input(s): VITAMINB12, FOLATE, FERRITIN, TIBC, IRON, RETICCTPCT in the last 72 hours. Sepsis Labs: No results for input(s): PROCALCITON, LATICACIDVEN in the last 168 hours.  No results found for this or any previous visit (from the past  240 hour(s)).   Radiology Studies: Dg Chest 2 View  Result Date: 05/30/2016 CLINICAL DATA:  Patient with cough and shortness of breath. EXAM: CHEST  2 VIEW COMPARISON:  Chest radiograph 05/24/2016. FINDINGS: Stable cardiac and mediastinal contours. Extensive emphysematous change. Unchanged consolidation within the right lower lung. No definite pleural effusion or pneumothorax. Aortic atherosclerosis. Osseous demineralization. IMPRESSION: Similar appearing consolidation within the right lower lung concerning for pneumonia in the appropriate clinical setting. Followup PA and lateral chest X-ray is recommended in 3-4 weeks following trial of antibiotic therapy to ensure resolution and exclude underlying malignancy. Electronically Signed   By: Lovey Newcomer M.D.   On: 05/30/2016 13:07   Dg Thoracic Spine 2 View  Result Date: 05/30/2016 CLINICAL DATA:  Mid scapular pain. Shortness of breath, cough, severe pain between shoulder blades. EXAM: THORACIC SPINE 2 VIEWS COMPARISON:  Chest x-ray today.  Chest CT 05/09/2016. FINDINGS: Diffuse osteopenia. Mild compression fracture in the lower thoracic spine, likely T12, stable since prior study. Mild compression fracture at T7, new since prior CT. Normal alignment. IMPRESSION: Mild chronic T12 compression fracture. Mild T7 compression fracture, new since 05/09/2016 Diffuse osteopenia. Electronically Signed   By: Rolm Baptise M.D.   On: 05/30/2016 16:49    Scheduled Meds: . aspirin EC  81 mg  Oral Daily  . azithromycin  500 mg Intravenous Q24H  . bisoprolol  2.5 mg Oral Daily  . buPROPion  300 mg Oral Daily  . cefTRIAXone (ROCEPHIN)  IV  1 g Intravenous Q24H  . clopidogrel  75 mg Oral Daily  . enoxaparin (LOVENOX) injection  40 mg Subcutaneous Q24H  . feeding supplement (ENSURE ENLIVE)  237 mL Oral BID BM  . feeding supplement (GLUCERNA SHAKE)  237 mL Oral BID BM  . fluticasone  2 spray Each Nare Daily  . fluticasone furoate-vilanterol  1 puff Inhalation Daily  . guaiFENesin  1,200 mg Oral BID  . insulin aspart  0-15 Units Subcutaneous TID WC  . insulin aspart  5 Units Subcutaneous Once  . insulin glargine  10 Units Subcutaneous QHS  . ipratropium-albuterol  3 mL Nebulization QID  . loratadine  10 mg Oral Daily  . omega-3 acid ethyl esters  1 g Oral BID  . pantoprazole  40 mg Oral Daily  . polyethylene glycol  17 g Oral BID  . [START ON 06/01/2016] predniSONE  20 mg Oral Q breakfast   Followed by  . [START ON 06/05/2016] predniSONE  10 mg Oral Q breakfast  . rosuvastatin  20 mg Oral Daily   Continuous Infusions:   LOS: 1 day   Dortha Neighbors, Orpah Melter, MD [facility] Pager [phone]  If 7PM-7AM, please contact night-coverage [email or web address] Password [email or web address] 05/31/2016, 4:12 PM

## 2016-05-31 NOTE — Progress Notes (Addendum)
Initial Nutrition Assessment  DOCUMENTATION CODES:   Severe malnutrition in context of chronic illness  INTERVENTION:   Glucerna Shake po TID, each supplement provides 220 kcal and 10 grams of protein  Magic cup TID with meals, each supplement provides 290 kcal and 9 grams of protein  NUTRITION DIAGNOSIS:   Malnutrition related to dysphagia, chronic illness as evidenced by severe depletion of body fat, severe depletion of muscle mass.  GOAL:   Patient will meet greater than or equal to 90% of their needs  MONITOR:   PO intake, Supplement acceptance  REASON FOR ASSESSMENT:   Malnutrition Screening Tool    ASSESSMENT:   65 y.o. 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  She has been coughing a lot secondary to her PNA and woke up from her nap on Christmas day with severe back pain between her shoulder blades.  She is having significant pain simply with breathing, but almost unable to cough secondary to pain.  She had plain films of her T-spine and was found to have a new T7 compression fracture.    Met with pt in room today. Pt with chronic dysphagia and aspiration pneumonia. Pt follows DYS 3/ nectar diet at home but reports that since October, she has been eating whatever she wants. SLP eval today; pt downgraded to DYS 2/nectar diet. Pt has thickener in her room and she thickens her own liquids. Pt reports eating 75% meals currently. Per chart, pt is weight stable. Pt reports poor appetite for 5 days pta. Pt complains of constipation today.   Medications reviewed and include: aspirin, azithromycin, ceftriaxone, plavix, insulin, omega 3, protonix, miralax, prednisone, oxycodone  Labs reviewed: Na 134(L), Cl 93(L), BUN 22(L), Ca 8.7(L) CBGs- 485, 197 x 24hrs   Nutrition-Focused physical exam completed. Findings are severe fat depletion, severe  muscle depletion, and no edema.   Diet Order:  DIET DYS 2 Room service appropriate? Yes; Fluid consistency: Nectar Thick  Skin:  Reviewed, no issues  Last BM:  12/16- constipation   Height:   Ht Readings from Last 1 Encounters:  05/30/16 '5\' 5"'$  (1.651 m)    Weight:   Wt Readings from Last 1 Encounters:  05/30/16 121 lb 0.5 oz (54.9 kg)    Ideal Body Weight:  56.8 kg  BMI:  Body mass index is 20.14 kg/m.  Estimated Nutritional Needs:   Kcal:  1650-1850kcal/day   Protein:  77-88g/day   Fluid:  >1.6L/day   EDUCATION NEEDS:   No education needs identified at this time  Koleen Distance, RD, LDN Pager #[phone]

## 2016-06-01 LAB — CBC
HEMATOCRIT: 33.6 % — AB (ref 36.0–46.0)
HEMOGLOBIN: 10.4 g/dL — AB (ref 12.0–15.0)
MCH: 27.6 pg (ref 26.0–34.0)
MCHC: 31 g/dL (ref 30.0–36.0)
MCV: 89.1 fL (ref 78.0–100.0)
Platelets: 245 10*3/uL (ref 150–400)
RBC: 3.77 MIL/uL — AB (ref 3.87–5.11)
RDW: 16 % — ABNORMAL HIGH (ref 11.5–15.5)
WBC: 7.3 10*3/uL (ref 4.0–10.5)

## 2016-06-01 LAB — GLUCOSE, CAPILLARY
GLUCOSE-CAPILLARY: 232 mg/dL — AB (ref 65–99)
Glucose-Capillary: 193 mg/dL — ABNORMAL HIGH (ref 65–99)
Glucose-Capillary: 230 mg/dL — ABNORMAL HIGH (ref 65–99)
Glucose-Capillary: 374 mg/dL — ABNORMAL HIGH (ref 65–99)

## 2016-06-01 NOTE — Progress Notes (Signed)
PROGRESS NOTE    Brenda [name]  VQQ:[id number] DOB: 1950/10/19 DOA: 05/30/2016 PCP: [name], MD    Brief Narrative:  65 y.o. female with medical history significant of COPD, CAD with stents, dysphagia with recurrent aspiration pneumonia.  She had been doing well with regards to dysphagia on a Dys 3 diet and nectar thick liquids (other than her AM coffee).  Until she "slacked off" on this in October.  Since then she has been admitted 3 previous times (today makes #4) for recurrent aspiration pneumonia of the RLL also with COPD exacerbation.  She reports she quit smoking on Nov 11th.  Last admit was just last week, discharged 6 days ago on Augmentin for RLL PNA sputum cultures last week grew out E.Coli that was pan-sensitive.  She reports no specific aspiration event (but says she never does know when she aspirates).  But she reports increasing SOB over the past 3 days.  Severe back pain with cough.  Symptoms persistent and worsening.  Denies fever.  Assessment & Plan:   Principal Problem:   Recurrent aspiration pneumonia ([place]) Active Problems:   DM (diabetes mellitus), type 2, uncontrolled ([place])   COPD (chronic obstructive pulmonary disease) ([place])   CAP (community acquired pneumonia)   Dysphagia with aspiration   Acute on chronic respiratory failure with hypoxia ([name])   Aspiration pneumonia of right lower lobe ([place])   1. Recurrent aspiration PNA of the RLL 1. Patient with elevation in WBC and worsening of symptoms despite augmentin 2. Patient started on rocephin / azithromycin--- Pulmonary following with recs to narrow to rocephin alone 3. Repeat sputum cultures pending 4. Continued on Dys 3 diet with nectar thick liquids 5. Cont O2 as needed- no 2L at home 2. Acute on chronic resp failure with hypoxia 1. Will continue O2 via [place] 3. COPD 1. Continue home meds 2. Continue bronchodilators as needed 4. DM2  1. Continue lantus 10 QHS fornow 2. Mod scale SSI  AC 3. Titrate insulin accordingly 5. CAD 1. Stable at present 2. No chest pain 3. Per below, hold plavix until OK to do so per IR 6. T7 compression fracture 1. Discussed with IR. Tentative plans for kyphoplasty next week-- needs to be off plavix x 5 days and needs insurance approval (patient can go home and come back for this next week) 2. plavix was discussed with Cardiology. Patient is past 46mo from last stent placement, thus OK to hold plavix for the time being for procedure. 3. Would resume plavix when OK to do so per IR 4. Cont analgesics as needed  DVT prophylaxis: Lovenox Code Status: Full Family Communication: Pt in room, family not at bedside Disposition Plan: home Monday and return Wed/Thursday for kypohplasty?  Consultants:   Pulmonary   IR  Procedures:     Antimicrobials: Anti-infectives    Start     Dose/Rate Route Frequency Ordered Stop   05/31/16 2000  cefTRIAXone (ROCEPHIN) 1 g in dextrose 5 % 50 mL IVPB     1 g 100 mL/hr over 30 Minutes Intravenous Every 24 hours 05/30/16 1954     05/30/16 2000  azithromycin (ZITHROMAX) 500 mg in dextrose 5 % 250 mL IVPB  Status:  Discontinued     500 mg 250 mL/hr over 60 Minutes Intravenous Every 24 hours 05/30/16 1954 05/31/16 1623   05/30/16 1930  cefTRIAXone (ROCEPHIN) 1 g in dextrose 5 % 50 mL IVPB  Status:  Discontinued     1 g 100 mL/hr over 30  Minutes Intravenous  Once 05/30/16 1926 05/30/16 1945   05/30/16 1930  cefTRIAXone (ROCEPHIN) 1 g in dextrose 5 % 50 mL IVPB     1 g 100 mL/hr over 30 Minutes Intravenous  Once 05/30/16 1926 05/30/16 2021      Subjective: Moving around room well  Objective: Vitals:   05/31/16 2007 05/31/16 2021 06/01/16 0549 06/01/16 0840  BP:  116/68 133/69   Pulse:  79 69 85  Resp:  '18 18 17  '$ Temp:  97.6 F (36.4 C) 97.6 F (36.4 C)   TempSrc:  Oral Oral   SpO2: 95% 100% 100% 96%  Weight:      Height:        Intake/Output Summary (Last 24 hours) at 06/01/16 1347 Last  data filed at 06/01/16 1000  Gross per 24 hour  Intake              290 ml  Output                0 ml  Net              290 ml   Filed Weights   05/30/16 1238 05/30/16 2146  Weight: 60.3 kg (133 lb) 54.9 kg (121 lb 0.5 oz)    Examination:  General exam: Appears calm and comfortable  Respiratory system: diminished. Respiratory effort normal. Cardiovascular system: S1 & S2 heard, RRR Gastrointestinal system: Abdomen is nondistended, soft and nontender. No organomegaly or masses felt. Normal bowel sounds heard. Central nervous system: Alert and oriented. No focal neurological deficits. Extremities: Symmetric 5 x 5 power.   Data Reviewed: I have personally reviewed following labs and imaging studies  CBC:  Recent Labs Lab 05/30/16 1410 05/31/16 0550 06/01/16 0507  WBC 16.6* 8.4 7.3  NEUTROABS 15.7*  --   --   HGB 13.3 10.8* 10.4*  HCT 41.6 32.3* 33.6*  MCV 91.4 89.5 89.1  PLT 240 243 413   Basic Metabolic Panel:  Recent Labs Lab 05/30/16 1423 05/30/16 1527 05/31/16 0550  NA 131*  --  134*  K 5.4* 5.2* 4.6  CL 88*  --  93*  CO2 36*  --  34*  GLUCOSE 485*  --  197*  BUN 19  --  22*  CREATININE 0.86  --  0.65  CALCIUM 9.4  --  8.7*   GFR: Estimated Creatinine Clearance: 60.8 mL/min (by C-G formula based on SCr of 0.65 mg/dL). Liver Function Tests: No results for input(s): AST, ALT, ALKPHOS, BILITOT, PROT, ALBUMIN in the last 168 hours. No results for input(s): LIPASE, AMYLASE in the last 168 hours. No results for input(s): AMMONIA in the last 168 hours. Coagulation Profile: No results for input(s): INR, PROTIME in the last 168 hours. Cardiac Enzymes:  Recent Labs Lab 05/30/16 1423  TROPONINI <0.03   BNP (last 3 results) No results for input(s): PROBNP in the last 8760 hours. HbA1C: No results for input(s): HGBA1C in the last 72 hours. CBG:  Recent Labs Lab 05/31/16 1136 05/31/16 1715 05/31/16 2105 06/01/16 0755 06/01/16 1158  GLUCAP 157* 265*  387* 193* 230*   Lipid Profile: No results for input(s): CHOL, HDL, LDLCALC, TRIG, CHOLHDL, LDLDIRECT in the last 72 hours. Thyroid Function Tests: No results for input(s): TSH, T4TOTAL, FREET4, T3FREE, THYROIDAB in the last 72 hours. Anemia Panel: No results for input(s): VITAMINB12, FOLATE, FERRITIN, TIBC, IRON, RETICCTPCT in the last 72 hours. Sepsis Labs: No results for input(s): PROCALCITON, LATICACIDVEN in the last 168 hours.  No results found for this or any previous visit (from the past 240 hour(s)).   Radiology Studies: Dg Thoracic Spine 2 View  Result Date: 05/30/2016 CLINICAL DATA:  Mid scapular pain. Shortness of breath, cough, severe pain between shoulder blades. EXAM: THORACIC SPINE 2 VIEWS COMPARISON:  Chest x-ray today.  Chest CT 05/09/2016. FINDINGS: Diffuse osteopenia. Mild compression fracture in the lower thoracic spine, likely T12, stable since prior study. Mild compression fracture at T7, new since prior CT. Normal alignment. IMPRESSION: Mild chronic T12 compression fracture. Mild T7 compression fracture, new since 05/09/2016 Diffuse osteopenia. Electronically Signed   By: Rolm Baptise M.D.   On: 05/30/2016 16:49   Mr Thoracic Spine Wo Contrast  Result Date: 05/31/2016 CLINICAL DATA:  65 year old female with mid scapular back pain, severe pain between the shoulder blades. T7 compression fracture seen on recent radiographs. Initial encounter. EXAM: MRI THORACIC SPINE WITHOUT CONTRAST TECHNIQUE: Multiplanar, multisequence MR imaging of the thoracic spine was performed. No intravenous contrast was administered. COMPARISON:  Thoracic spine series [id number]. Chest CTA 05/09/2016 and earlier. FINDINGS: Limited sagittal imaging of the cervical spine is remarkable only for mild scoliosis. Alignment: Mild to moderate dextroconvex upper thoracic scoliosis. Mildly exaggerated thoracic kyphosis has not significantly changed since the prior CTA. Vertebrae: Compression of the T7 vertebral  body with about 40% loss of vertebral body height is new since 05/09/2016, however, the vertebral body demonstrates only low level marrow edema. Mild superior endplate compression of the T12 vertebral body appears stable since 05/09/2016. There is mild or trace marrow edema at that level. There is mild retropulsion of the posterosuperior endplate as on the prior CTA, see below. No other acute osseous abnormality identified. Cord: Spinal cord signal is within normal limits at all visualized levels. The conus medullaris terminates at T12-L1. Paraspinal and other soft tissues: Continued and perhaps mildly progressed right lower lobe consolidation since the prior CTA. This appears similar to the recent radiographs such as on 05/30/2016. There is a small associated right pleural effusion. Otherwise negative visualized thoracic viscera. Negative visualized upper abdominal viscera. There is increased STIR signal in the T7-T8 interspinous ligament (series 7, image 10). This is associated with trace fluid in the bilateral T6-T7 and T7-T8 facet joints. There is also trace fluid in the bilateral T8-T9 facet joints. All of these facets are mildly hypertrophied. The other paraspinal soft tissues are within normal limits. Disc levels: Mild for age thoracic spine degeneration overall. Disc degeneration at T11-T12 might in part be posttraumatic. There is a circumferential disc bulge here as well as a broad-based right paracentral disc protrusion in association with the posterior T12 superior endplate retropulsion. There is borderline to mild spinal stenosis, mostly involving the right spinal canal, with no spinal cord mass effect (series 10, image 37). Superimposed mild right T11 foraminal stenosis. No other thoracic spinal stenosis. There is borderline to mild right T8 neural foraminal stenosis related to a small foraminal disc protrusion best seen on series 9, image 6. IMPRESSION: 1. Moderate T7 compression fracture with 40% loss  of vertebral body height is new since the chest CTA on 05/09/2016, however, the vertebral body demonstrates little associated marrow edema. 2. Associated abnormal signal in the T7-T8 interspinous ligament, and fluid in the bilateral facet joints from T6-T7 to T8-T9. These could reflect injury related to the T7 fracture or subsequent biomechanical stress. 3. Mild T12 superior endplate compression fracture appears stable since 05/09/2016. Minimal associated marrow edema at this level also. Mild retropulsion of the  mild superior T12 endplate results in spinal stenosis without spinal cord mass effect. 4. Right lower lobe pneumonia with associated small right pleural effusion. Electronically Signed   By: Genevie Ann M.D.   On: 05/31/2016 16:38    Scheduled Meds: . aspirin EC  81 mg Oral Daily  . bisoprolol  2.5 mg Oral Daily  . buPROPion  300 mg Oral Daily  . cefTRIAXone (ROCEPHIN)  IV  1 g Intravenous Q24H  . enoxaparin (LOVENOX) injection  40 mg Subcutaneous Q24H  . feeding supplement (ENSURE ENLIVE)  237 mL Oral BID BM  . feeding supplement (GLUCERNA SHAKE)  237 mL Oral BID BM  . fluticasone  2 spray Each Nare Daily  . fluticasone furoate-vilanterol  1 puff Inhalation Daily  . guaiFENesin  1,200 mg Oral BID  . insulin aspart  0-15 Units Subcutaneous TID WC  . insulin aspart  5 Units Subcutaneous Once  . insulin glargine  10 Units Subcutaneous QHS  . ipratropium-albuterol  3 mL Nebulization QID  . loratadine  10 mg Oral Daily  . omega-3 acid ethyl esters  1 g Oral BID  . pantoprazole  40 mg Oral Daily  . polyethylene glycol  17 g Oral BID  . predniSONE  20 mg Oral Q breakfast   Followed by  . [START ON 06/05/2016] predniSONE  10 mg Oral Q breakfast  . rosuvastatin  20 mg Oral Daily   Continuous Infusions:   LOS: 2 days   [place], DO [facility] Pager [phone]  If 7PM-7AM, please contact night-coverage [email or web address] Password [email or web address] 06/01/2016, 1:47 PM

## 2016-06-01 NOTE — Progress Notes (Signed)
Patient ID: Brenda [name], female   DOB: 1950-09-09, 65 y.o.   MRN: [id number] Recent imaging studies were reviewed by Dr. Vernard Gambles and pt appears to be candidate for T7 vertebral body augmentation. Above discussed briefly with pt and she would like to proceed . She will need to remain off plavix for 5 days preprocedure and insurance authorization obtained as well. If she is discharged over weekend we can tent set up for next week as OP or if she remains inhouse we can follow and set up once above parameters met.

## 2016-06-01 NOTE — Evaluation (Signed)
PT Cancellation Note  Patient Details Name: Brenda [name] MRN: [id number] DOB: August 23, 1950   Cancelled Treatment:   Pt eating lunch, but states she has been up walking in the room earlier today without difficulty, only pain from compression fracture.  She plans to have kyphoplasty next week. Nurse confirms that patient has been up in room even putting on her socks on her own.  Will follow up for PT eval at a later time     Donato Heinz. Owens Shark, PT  Norwood Levo 06/01/2016, 1:20 PM

## 2016-06-02 LAB — GLUCOSE, CAPILLARY
GLUCOSE-CAPILLARY: 309 mg/dL — AB (ref 65–99)
Glucose-Capillary: 198 mg/dL — ABNORMAL HIGH (ref 65–99)
Glucose-Capillary: 207 mg/dL — ABNORMAL HIGH (ref 65–99)
Glucose-Capillary: 322 mg/dL — ABNORMAL HIGH (ref 65–99)

## 2016-06-02 MED ORDER — BENZONATATE 100 MG PO CAPS
100.0000 mg | ORAL_CAPSULE | Freq: Three times a day (TID) | ORAL | Status: DC
  Administered 2016-06-02 – 2016-06-03 (×4): 100 mg via ORAL
  Filled 2016-06-02 (×4): qty 1

## 2016-06-02 NOTE — Evaluation (Signed)
Physical Therapy Evaluation Patient Details Name: Brenda [name] MRN: [id number] DOB: [birth year] Today's Date: 06/02/2016   History of Present Illness  Brenda [name] is a 65 y.o. female with medical history significant of COPD, CAD with stents, dysphagia with recurrent aspiration pneumonia. she has been admitted 3 previous times for recurrent aspiration pneumonia of the RLL also with COPD exacerbation.. Admitted 05/30/16. The patient has a compression fracture at T7. Possible kyphoplasty this week.  Clinical Impression      Follow Up Recommendations Home health PT    Equipment Recommendations  None recommended by PT    Recommendations for Other Services       Precautions / Restrictions Precautions Precautions: Fall      Mobility  Bed Mobility Overal bed mobility: Needs Assistance Bed Mobility: Sidelying to Sit;Sit to Sidelying   Sidelying to sit: Min guard     Sit to sidelying: Min guard General bed mobility comments: Discussed rolling to come OOB via UE to decrease stress on her back  Transfers Overall transfer level: Needs assistance Equipment used: None;Rolling walker (2 wheeled) Transfers: Sit to/from Stand Sit to Stand: Min guard            Ambulation/Gait Ambulation/Gait assistance: Min assist Ambulation Distance (Feet): 100 Feet Assistive device: Rolling walker (2 wheeled) Gait Pattern/deviations: Step-through pattern;Trunk flexed     General Gait Details: forward fklexed posture, reports pain in the back  Stairs            Wheelchair Mobility    Modified Rankin (Stroke Patients Only)       Balance   Sitting-balance support: No upper extremity supported         Standing balance-Leahy Scale: Fair                               Pertinent Vitals/Pain Faces Pain Scale: Hurts even more Pain Location: back Pain Descriptors / Indicators: Aching;Stabbing Pain Intervention(s): Monitored during session;Premedicated before  session    Home Living Family/patient expects to be discharged to:: Private residence Living Arrangements: Spouse/significant other;Children Available Help at Discharge: Family;Available PRN/intermittently Type of Home: House Home Access: Stairs to enter Entrance Stairs-Rails: Right Entrance Stairs-Number of Steps: 3 Home Layout: Two level;Able to live on main level with bedroom/bathroom        Prior Function                 Hand Dominance        Extremity/Trunk Assessment   Upper Extremity Assessment Upper Extremity Assessment: Generalized weakness    Lower Extremity Assessment Lower Extremity Assessment: Generalized weakness    Cervical / Trunk Assessment Cervical / Trunk Assessment: Kyphotic  Communication      Cognition Arousal/Alertness: Awake/alert Behavior During Therapy: WFL for tasks assessed/performed Overall Cognitive Status: Within Functional Limits for tasks assessed                      General Comments      Exercises     Assessment/Plan    PT Assessment Patient needs continued PT services  PT Problem List Decreased strength;Decreased activity tolerance;Decreased balance;Decreased mobility;Cardiopulmonary status limiting activity          PT Treatment Interventions Gait training;Stair training;Functional mobility training;Therapeutic activities;Balance training;Patient/family education    PT Goals (Current goals can be found in the Care Plan section)  Acute Rehab PT Goals Patient Stated Goal: to get my surgery and not be  in pain PT Goal Formulation: With patient Time For Goal Achievement: 06/16/16 Potential to Achieve Goals: Good    Frequency Min 3X/week   Barriers to discharge        Co-evaluation               End of Session   Activity Tolerance: Patient limited by pain;Patient limited by fatigue Patient left: in bed;with call bell/phone within reach Nurse Communication: Mobility status         Time:  3142-7670 PT Time Calculation (min) (ACUTE ONLY): 13 min   Charges:   PT Evaluation $PT Eval Low Complexity: 1 Procedure     PT G CodesClaretha [name] 06/02/2016, 11:57 AM

## 2016-06-02 NOTE — Progress Notes (Signed)
PROGRESS NOTE    Brenda [name]  LOV:[id number] DOB: Nov 11, 1950 DOA: 05/30/2016 PCP: [name], MD    Brief Narrative:  65 y.o. female with medical history significant of COPD, CAD with stents, dysphagia with recurrent aspiration pneumonia.  She had been doing well with regards to dysphagia on a Dys 3 diet and nectar thick liquids (other than her AM coffee).  Until she "slacked off" on this in October.  Since then she has been admitted 3 previous times (today makes #4) for recurrent aspiration pneumonia of the RLL also with COPD exacerbation.  She reports she quit smoking on Nov 11th.  Last admit was just last week, discharged 6 days ago on Augmentin for RLL PNA sputum cultures last week grew out E.Coli that was pan-sensitive.  She reports no specific aspiration event (but says she never does know when she aspirates).  But she reports increasing SOB over the past 3 days.  Severe back pain with cough.  Symptoms persistent and worsening.  Denies fever.  Assessment & Plan:   Principal Problem:   Recurrent aspiration pneumonia ([place]) Active Problems:   DM (diabetes mellitus), type 2, uncontrolled ([place])   COPD (chronic obstructive pulmonary disease) ([place])   CAP (community acquired pneumonia)   Dysphagia with aspiration   Acute on chronic respiratory failure with hypoxia ([name])   Aspiration pneumonia of right lower lobe ([place])   1. Recurrent aspiration PNA of the RLL 1. Patient with elevation in WBC and worsening of symptoms despite augmentin 2. Patient started on rocephin / azithromycin--- Pulmonary following with recs to narrow to rocephin alone 3. Repeat sputum cultures pending 4. Continued on Dys 3 diet with nectar thick liquids 5. Cont O2 as needed- no 2L at home 2. Acute on chronic resp failure with hypoxia 1. Will continue O2 via [place] 3. COPD 1. Continue home meds 2. Continue bronchodilators as needed 4. DM2  1. Continue lantus 10 QHS fornow 2. Mod scale SSI  AC 3. Titrate insulin accordingly 5. CAD 1. Stable at present 2. No chest pain 3. Per below, hold plavix until OK to do so per IR 6. T7 compression fracture 1. Discussed with IR. Tentative plans for kyphoplasty next week-- needs to be off plavix x 5 days and needs insurance approval (patient can go home and come back for this next week) 2. plavix was discussed with Cardiology. Patient is past 3mo from last stent placement, thus OK to hold plavix for the time being for procedure. 3. Would resume plavix when OK to do so per IR 4. Cont analgesics as needed  DVT prophylaxis: Lovenox Code Status: Full Family Communication: Pt in room, family not at bedside Disposition Plan: home Monday and return Wed/Thursday for kypohplasty?  Consultants:   Pulmonary   IR  Procedures:     Antimicrobials: Anti-infectives    Start     Dose/Rate Route Frequency Ordered Stop   05/31/16 2000  cefTRIAXone (ROCEPHIN) 1 g in dextrose 5 % 50 mL IVPB     1 g 100 mL/hr over 30 Minutes Intravenous Every 24 hours 05/30/16 1954     05/30/16 2000  azithromycin (ZITHROMAX) 500 mg in dextrose 5 % 250 mL IVPB  Status:  Discontinued     500 mg 250 mL/hr over 60 Minutes Intravenous Every 24 hours 05/30/16 1954 05/31/16 1623   05/30/16 1930  cefTRIAXone (ROCEPHIN) 1 g in dextrose 5 % 50 mL IVPB  Status:  Discontinued     1 g 100 mL/hr over 30  Minutes Intravenous  Once 05/30/16 1926 05/30/16 1945   05/30/16 1930  cefTRIAXone (ROCEPHIN) 1 g in dextrose 5 % 50 mL IVPB     1 g 100 mL/hr over 30 Minutes Intravenous  Once 05/30/16 1926 05/30/16 2021      Subjective: More pain this AM +cough  Objective: Vitals:   06/01/16 2107 06/01/16 2140 06/02/16 0638 06/02/16 0923  BP: 122/62  (!) 142/80   Pulse: 81  69   Resp: 18  18   Temp: 98.5 F (36.9 C)  97.7 F (36.5 C)   TempSrc: Oral  Oral   SpO2: 98% 94% 100% 100%  Weight:      Height:        Intake/Output Summary (Last 24 hours) at 06/02/16 1048 Last  data filed at 06/01/16 1753  Gross per 24 hour  Intake              480 ml  Output                0 ml  Net              480 ml   Filed Weights   05/30/16 1238 05/30/16 2146  Weight: 60.3 kg (133 lb) 54.9 kg (121 lb 0.5 oz)    Examination:  General exam: Appears calm and comfortable  Respiratory system: diminished. Respiratory effort normal. Cardiovascular system: S1 & S2 heard, RRR Gastrointestinal system: Abdomen is nondistended, soft and nontender. No organomegaly or masses felt. Normal bowel sounds heard.    Data Reviewed: I have personally reviewed following labs and imaging studies  CBC:  Recent Labs Lab 05/30/16 1410 05/31/16 0550 06/01/16 0507  WBC 16.6* 8.4 7.3  NEUTROABS 15.7*  --   --   HGB 13.3 10.8* 10.4*  HCT 41.6 32.3* 33.6*  MCV 91.4 89.5 89.1  PLT 240 243 657   Basic Metabolic Panel:  Recent Labs Lab 05/30/16 1423 05/30/16 1527 05/31/16 0550  NA 131*  --  134*  K 5.4* 5.2* 4.6  CL 88*  --  93*  CO2 36*  --  34*  GLUCOSE 485*  --  197*  BUN 19  --  22*  CREATININE 0.86  --  0.65  CALCIUM 9.4  --  8.7*   GFR: Estimated Creatinine Clearance: 60.8 mL/min (by C-G formula based on SCr of 0.65 mg/dL). Liver Function Tests: No results for input(s): AST, ALT, ALKPHOS, BILITOT, PROT, ALBUMIN in the last 168 hours. No results for input(s): LIPASE, AMYLASE in the last 168 hours. No results for input(s): AMMONIA in the last 168 hours. Coagulation Profile: No results for input(s): INR, PROTIME in the last 168 hours. Cardiac Enzymes:  Recent Labs Lab 05/30/16 1423  TROPONINI <0.03   BNP (last 3 results) No results for input(s): PROBNP in the last 8760 hours. HbA1C: No results for input(s): HGBA1C in the last 72 hours. CBG:  Recent Labs Lab 06/01/16 0755 06/01/16 1158 06/01/16 1621 06/01/16 2119 06/02/16 0726  GLUCAP 193* 230* 232* 374* 198*   Lipid Profile: No results for input(s): CHOL, HDL, LDLCALC, TRIG, CHOLHDL, LDLDIRECT in the  last 72 hours. Thyroid Function Tests: No results for input(s): TSH, T4TOTAL, FREET4, T3FREE, THYROIDAB in the last 72 hours. Anemia Panel: No results for input(s): VITAMINB12, FOLATE, FERRITIN, TIBC, IRON, RETICCTPCT in the last 72 hours. Sepsis Labs: No results for input(s): PROCALCITON, LATICACIDVEN in the last 168 hours.  No results found for this or any previous visit (from the past 240  hour(s)).   Radiology Studies: Mr Thoracic Spine Wo Contrast  Result Date: 05/31/2016 CLINICAL DATA:  65 year old female with mid scapular back pain, severe pain between the shoulder blades. T7 compression fracture seen on recent radiographs. Initial encounter. EXAM: MRI THORACIC SPINE WITHOUT CONTRAST TECHNIQUE: Multiplanar, multisequence MR imaging of the thoracic spine was performed. No intravenous contrast was administered. COMPARISON:  Thoracic spine series [id number]. Chest CTA 05/09/2016 and earlier. FINDINGS: Limited sagittal imaging of the cervical spine is remarkable only for mild scoliosis. Alignment: Mild to moderate dextroconvex upper thoracic scoliosis. Mildly exaggerated thoracic kyphosis has not significantly changed since the prior CTA. Vertebrae: Compression of the T7 vertebral body with about 40% loss of vertebral body height is new since 05/09/2016, however, the vertebral body demonstrates only low level marrow edema. Mild superior endplate compression of the T12 vertebral body appears stable since 05/09/2016. There is mild or trace marrow edema at that level. There is mild retropulsion of the posterosuperior endplate as on the prior CTA, see below. No other acute osseous abnormality identified. Cord: Spinal cord signal is within normal limits at all visualized levels. The conus medullaris terminates at T12-L1. Paraspinal and other soft tissues: Continued and perhaps mildly progressed right lower lobe consolidation since the prior CTA. This appears similar to the recent radiographs such as on  05/30/2016. There is a small associated right pleural effusion. Otherwise negative visualized thoracic viscera. Negative visualized upper abdominal viscera. There is increased STIR signal in the T7-T8 interspinous ligament (series 7, image 10). This is associated with trace fluid in the bilateral T6-T7 and T7-T8 facet joints. There is also trace fluid in the bilateral T8-T9 facet joints. All of these facets are mildly hypertrophied. The other paraspinal soft tissues are within normal limits. Disc levels: Mild for age thoracic spine degeneration overall. Disc degeneration at T11-T12 might in part be posttraumatic. There is a circumferential disc bulge here as well as a broad-based right paracentral disc protrusion in association with the posterior T12 superior endplate retropulsion. There is borderline to mild spinal stenosis, mostly involving the right spinal canal, with no spinal cord mass effect (series 10, image 37). Superimposed mild right T11 foraminal stenosis. No other thoracic spinal stenosis. There is borderline to mild right T8 neural foraminal stenosis related to a small foraminal disc protrusion best seen on series 9, image 6. IMPRESSION: 1. Moderate T7 compression fracture with 40% loss of vertebral body height is new since the chest CTA on 05/09/2016, however, the vertebral body demonstrates little associated marrow edema. 2. Associated abnormal signal in the T7-T8 interspinous ligament, and fluid in the bilateral facet joints from T6-T7 to T8-T9. These could reflect injury related to the T7 fracture or subsequent biomechanical stress. 3. Mild T12 superior endplate compression fracture appears stable since 05/09/2016. Minimal associated marrow edema at this level also. Mild retropulsion of the mild superior T12 endplate results in spinal stenosis without spinal cord mass effect. 4. Right lower lobe pneumonia with associated small right pleural effusion. Electronically Signed   By: Genevie Ann M.D.   On:  05/31/2016 16:38    Scheduled Meds: . aspirin EC  81 mg Oral Daily  . benzonatate  100 mg Oral TID  . bisoprolol  2.5 mg Oral Daily  . buPROPion  300 mg Oral Daily  . cefTRIAXone (ROCEPHIN)  IV  1 g Intravenous Q24H  . enoxaparin (LOVENOX) injection  40 mg Subcutaneous Q24H  . feeding supplement (ENSURE ENLIVE)  237 mL Oral BID BM  .  feeding supplement (GLUCERNA SHAKE)  237 mL Oral BID BM  . fluticasone  2 spray Each Nare Daily  . fluticasone furoate-vilanterol  1 puff Inhalation Daily  . guaiFENesin  1,200 mg Oral BID  . insulin aspart  0-15 Units Subcutaneous TID WC  . insulin aspart  5 Units Subcutaneous Once  . insulin glargine  10 Units Subcutaneous QHS  . ipratropium-albuterol  3 mL Nebulization QID  . loratadine  10 mg Oral Daily  . omega-3 acid ethyl esters  1 g Oral BID  . pantoprazole  40 mg Oral Daily  . polyethylene glycol  17 g Oral BID  . predniSONE  20 mg Oral Q breakfast   Followed by  . [START ON 06/05/2016] predniSONE  10 mg Oral Q breakfast  . rosuvastatin  20 mg Oral Daily   Continuous Infusions:   LOS: 3 days   [place], DO [facility] Pager [phone]  If 7PM-7AM, please contact night-coverage [email or web address] Password [email or web address] 06/02/2016, 10:48 AM

## 2016-06-03 ENCOUNTER — Other Ambulatory Visit: Payer: Self-pay | Admitting: Radiology

## 2016-06-03 DIAGNOSIS — S22000A Wedge compression fracture of unspecified thoracic vertebra, initial encounter for closed fracture: Secondary | ICD-10-CM

## 2016-06-03 LAB — CBC WITH DIFFERENTIAL/PLATELET
BASOS ABS: 0 10*3/uL (ref 0.0–0.1)
Basophils Relative: 0 %
EOS PCT: 1 %
Eosinophils Absolute: 0.1 10*3/uL (ref 0.0–0.7)
HCT: 34.1 % — ABNORMAL LOW (ref 36.0–46.0)
HEMOGLOBIN: 11 g/dL — AB (ref 12.0–15.0)
LYMPHS ABS: 1 10*3/uL (ref 0.7–4.0)
LYMPHS PCT: 17 %
MCH: 29.1 pg (ref 26.0–34.0)
MCHC: 32.3 g/dL (ref 30.0–36.0)
MCV: 90.2 fL (ref 78.0–100.0)
Monocytes Absolute: 0.5 10*3/uL (ref 0.1–1.0)
Monocytes Relative: 8 %
NEUTROS ABS: 4.5 10*3/uL (ref 1.7–7.7)
NEUTROS PCT: 74 %
PLATELETS: 230 10*3/uL (ref 150–400)
RBC: 3.78 MIL/uL — AB (ref 3.87–5.11)
RDW: 16.2 % — ABNORMAL HIGH (ref 11.5–15.5)
WBC: 6.1 10*3/uL (ref 4.0–10.5)

## 2016-06-03 LAB — PROTIME-INR
INR: 0.96
Prothrombin Time: 12.8 seconds (ref 11.4–15.2)

## 2016-06-03 LAB — GLUCOSE, CAPILLARY
GLUCOSE-CAPILLARY: 222 mg/dL — AB (ref 65–99)
Glucose-Capillary: 135 mg/dL — ABNORMAL HIGH (ref 65–99)

## 2016-06-03 MED ORDER — CLOPIDOGREL BISULFATE 75 MG PO TABS: 75.0000 mg | ORAL_TABLET | Freq: Every day | ORAL | 12 refills | Status: DC

## 2016-06-03 MED ORDER — AMOXICILLIN-POT CLAVULANATE 875-125 MG PO TABS: 1.0000 | ORAL_TABLET | Freq: Two times a day (BID) | ORAL | 0 refills | Status: DC

## 2016-06-03 MED ORDER — OXYCODONE-ACETAMINOPHEN 5-325 MG PO TABS: 1.0000 | ORAL_TABLET | Freq: Four times a day (QID) | ORAL | 0 refills | Status: DC | PRN

## 2016-06-03 MED ORDER — IPRATROPIUM-ALBUTEROL 0.5-2.5 (3) MG/3ML IN SOLN
3.0000 mL | Freq: Two times a day (BID) | RESPIRATORY_TRACT | Status: DC
  Administered 2016-06-03: 3 mL via RESPIRATORY_TRACT
  Filled 2016-06-03: qty 3

## 2016-06-03 NOTE — Discharge Summary (Signed)
Physician Discharge Summary  Brenda [name] WRU:[id number] DOB: 1950-07-01 DOA: 05/30/2016  PCP: [name], MD  Admit date: 05/30/2016 Discharge date: 06/03/2016  Admitted From:Home Disposition: Home with home care   Recommendations for Outpatient Follow-up:  1. Follow up with PCP in 1-2 weeks 2. Please obtain BMP/CBC in one week 3. Please follow up with radiology for kyphoplasty, hold Plavix until then.   Home Health: Yes Equipment/Devices: None Discharge Condition: Stable CODE STATUS: Full code Diet recommendation: Dysphagia 2 heart healthy diet.  Brief/Interim Summary:66 y.o.femalewith medical history significant of COPD, CAD with stents, dysphagia with recurrent aspiration pneumonia requiring multiple admissions presented with increasing shortness of breath associated with cough and severe back pain. Denied fall, fever or chills.  Please see below for details.  #Recurrent aspiration pneumonia of right lower lobe: Patient with elevated WBC count therefore treated with ceftriaxone and azithromycin in the hospital. Then antibiotics narrowed down. Evaluated by swallow team and currently on dysphagia diet. Today she feels better. On only 2 L of oxygen which is her home dose. Planning to discharge with 7 days of oral Augmentin. I recommended patient to follow-up with her pulmonologist and use incentive spirometer. They verbalized understanding.  #Acute on chronic respiratory failure with hypoxia: Improved now. Continue to liters of oxygen at home dose.  #COPD: Stable, continue bronchodilators.  #Type 2 diabetes: Resume home insulin regimen. Advised to monitor blood sugar level.  #Thoracic 7 compression fracture: As per prior reported it was discussed with the interventional radiology, plan for kyphoplasty as an outpatient coming week. I advised patient to follow-up outpatient and recommended to hold Plavix until she has the procedure done. As per prior progress note it was  discussed with the cardiologist. The cardiologist recommended okay to hold Plavix since they stent placement was more than 6 months ago. Patient verbalized understanding. Asking some pain medications until she has the procedure. Education provided regarding narcotics. -Also referred home care for physical therapy. -Patient reported she is able to ambulate with help of walker and she goes to bathroom without any support. I believe patient is medically stable to transfer her care to outpatient with close follow-up with her PCP and for kyphoplasty.  I discussed with the patient and her husband at bedside. They verbalized understanding.  Discharge Diagnoses:  Principal Problem:   Recurrent aspiration pneumonia ([name]) Active Problems:   DM (diabetes mellitus), type 2, uncontrolled ([name])   COPD (chronic obstructive pulmonary disease) ([name])   CAP (community acquired pneumonia)   Dysphagia with aspiration   Acute on chronic respiratory failure with hypoxia ([name])   Aspiration pneumonia of right lower lobe [facility])    Discharge Instructions  Discharge Instructions    Call MD for:  difficulty breathing, headache or visual disturbances    Complete by:  As directed    Call MD for:  hives    Complete by:  As directed    Call MD for:  persistant dizziness or light-headedness    Complete by:  As directed    Call MD for:  persistant nausea and vomiting    Complete by:  As directed    Call MD for:  severe uncontrolled pain    Complete by:  As directed    Call MD for:  temperature >100.4    Complete by:  As directed    Diet - low sodium heart healthy    Complete by:  As directed    Dysphagia 2/nectar-thick liquids (small amounts)  Medication Administration: Crushed with puree  Other  Recommendations Oral Care Recommendations: Oral care prior to ice chip/H20;Oral care BID Other Recommendations: Prohibited food (jello, ice cream, thin soups);Order thickener from pharmacy;Remove water pitcher    Discharge instructions    Complete by:  As directed    Please call interventional radiology for kyphoplasty. Please do not take Plavix on until you have procedure. Then you may resume after discussion with the physician.   Increase activity slowly    Complete by:  As directed      Allergies as of 06/03/2016      Reactions   Tape Hives, Other (See Comments)   USE PAPER TAPE ONLY- Adhesive peels off skin-makes pt. Raw.   Isosorbide Other (See Comments)   Drops BP too low   Nicoderm [nicotine] Rash   To the PATCH only, breakouts on skin      Medication List    TAKE these medications   acetaminophen 325 MG tablet Commonly known as:  TYLENOL Take 2 tablets (650 mg total) by mouth every 6 (six) hours as needed. What changed:  reasons to take this   albuterol 108 (90 Base) MCG/ACT inhaler Commonly known as:  PROVENTIL HFA;VENTOLIN HFA Inhale 2 puffs into the lungs every 6 (six) hours as needed for wheezing or shortness of breath.   amoxicillin-clavulanate 875-125 MG tablet Commonly known as:  AUGMENTIN Take 1 tablet by mouth 2 (two) times daily.   aspirin EC 81 MG tablet Take 1 tablet (81 mg total) by mouth daily.   bisoprolol 5 MG tablet Commonly known as:  ZEBETA Take 0.5 tablets (2.5 mg total) by mouth daily.   buPROPion 300 MG 24 hr tablet Commonly known as:  WELLBUTRIN XL Take 300 mg by mouth daily.   clopidogrel 75 MG tablet Commonly known as:  PLAVIX Take 1 tablet (75 mg total) by mouth daily. Please don't take plavix until you have kyphoplasty. May resume after the procedure, please talk your PCP. Start taking on:  06/10/2016 What changed:  additional instructions   cyclobenzaprine 5 MG tablet Commonly known as:  FLEXERIL Take 1-2 tablets (5-10 mg total) by mouth 3 (three) times daily as needed for muscle spasms.   feeding supplement (GLUCERNA SHAKE) Liqd Take 237 mLs by mouth 2 (two) times daily between meals.   fish oil-omega-3 fatty acids 1000 MG  capsule Take 1 g by mouth 2 (two) times daily.   fluticasone 50 MCG/ACT nasal spray Commonly known as:  FLONASE Place 2 sprays into both nostrils daily.   fluticasone furoate-vilanterol 100-25 MCG/INH Aepb Commonly known as:  BREO ELLIPTA Inhale 1 puff into the lungs daily.   guaiFENesin 600 MG 12 hr tablet Commonly known as:  MUCINEX Take 1,200 mg by mouth 2 (two) times daily.   ibuprofen 200 MG tablet Commonly known as:  ADVIL,MOTRIN Take 400 mg by mouth every 6 (six) hours as needed (Pain).   insulin glargine 100 UNIT/ML injection Commonly known as:  LANTUS Inject 0.1 mLs (10 Units total) into the skin at bedtime.   insulin lispro 100 UNIT/ML injection Commonly known as:  HUMALOG Inject 3 Units into the skin 3 (three) times daily with meals as needed for high blood sugar.   ipratropium-albuterol 0.5-2.5 (3) MG/3ML Soln Commonly known as:  DUONEB Take 3 mLs by nebulization every 6 (six) hours. What changed:  when to take this  reasons to take this   loratadine 10 MG tablet Commonly known as:  CLARITIN Take 1 tablet (10 mg total) by mouth daily.  nitroGLYCERIN 0.4 MG/SPRAY spray Commonly known as:  NITROLINGUAL Place 1 spray under the tongue every 5 (five) minutes as needed for chest pain.   omeprazole 20 MG capsule Commonly known as:  PRILOSEC Take 20 mg by mouth daily.   oxyCODONE-acetaminophen 5-325 MG tablet Commonly known as:  PERCOCET/ROXICET Take 1 tablet by mouth every 6 (six) hours as needed for moderate pain or severe pain.   polyethylene glycol powder powder Commonly known as:  GLYCOLAX/MIRALAX Take 17 g by mouth 2 (two) times daily.   predniSONE 10 MG tablet Commonly known as:  DELTASONE Take 4 tabs  daily with food x 4 days, then 3 tabs daily x 4 days, then 2 tabs daily x 4 days, then 1 tab daily x4 days then stop. #40   RESOURCE THICKENUP CLEAR Powd Use with thin liquids to make nectar thickened consistency   rosuvastatin 20 MG  tablet Commonly known as:  CRESTOR Take 20 mg by mouth daily.   SPIRIVA RESPIMAT 2.5 MCG/ACT Aers Generic drug:  Tiotropium Bromide Monohydrate 2 puffs daily.   traMADol 50 MG tablet Commonly known as:  ULTRAM Take 1 tablet (50 mg total) by mouth every 6 (six) hours as needed for moderate pain or severe pain.      Follow-up Information    MORROW, Marjory Lies, MD. Schedule an appointment as soon as possible for a visit in 1 week(s).   Specialty:  Family Medicine Contact information: [place] [address] [phone]        Wallace Going, Wynona Luna, MD Follow up.   Specialty:  Interventional Radiology Why:  The office may call you with the appointment if not please call the office to confirm the appointment.  Contact information: [address] [place] [address] [phone]          Allergies  Allergen Reactions  . Tape Hives and Other (See Comments)    USE PAPER TAPE ONLY- Adhesive peels off skin-makes pt. Raw.  . Isosorbide Other (See Comments)    Drops BP too low  . Nicoderm [Nicotine] Rash    To the PATCH only, breakouts on skin    Consultations: Pulmonologist.  Procedures/Studies: None  Subjective: Patient was seen and examined at bedside. Reported feeling better. Still has dry cough. Back pain is improved with the current pain medications. Denied headache, dizziness, fever, chills, chest pain, shortness of breath, nausea or vomiting. I emphasized on taking deep breath and using incentive spirometer. Reported able to ambulate with the help of walker.   Discharge Exam: Vitals:   06/03/16 0504 06/03/16 0920  BP: (!) 158/67   Pulse: 70 82  Resp:  17  Temp: 97.7 F (36.5 C)    Vitals:   06/02/16 2026 06/02/16 2049 06/03/16 0504 06/03/16 0920  BP: (!) 112/59  (!) 158/67   Pulse: 73  70 82  Resp: 16   17  Temp: 98 F (36.7 C)  97.7 F (36.5 C)   TempSrc: Oral  Oral   SpO2: 100% 98% 100% 99%  Weight:       Height:        General: Pt is alert, awake, not in acute distress Cardiovascular: RRR, S1/S2 +, no rubs, no gallops Respiratory: Clear to auscultation bilateral, no wheezing or crackles appreciated. Abdominal: Soft, NT, ND, bowel sounds + Extremities: no edema, no cyanosis Neurology: Alert, awake, nonfocal neurological exam.   The results of significant diagnostics from this hospitalization (including imaging, microbiology, ancillary  and laboratory) are listed below for reference.     Microbiology: No results found for this or any previous visit (from the past 240 hour(s)).   Labs: BNP (last 3 results)  Recent Labs  05/09/16 2102  BNP 147.8*   Basic Metabolic Panel:  Recent Labs Lab 05/30/16 1423 05/30/16 1527 05/31/16 0550  NA 131*  --  134*  K 5.4* 5.2* 4.6  CL 88*  --  93*  CO2 36*  --  34*  GLUCOSE 485*  --  197*  BUN 19  --  22*  CREATININE 0.86  --  0.65  CALCIUM 9.4  --  8.7*   Liver Function Tests: No results for input(s): AST, ALT, ALKPHOS, BILITOT, PROT, ALBUMIN in the last 168 hours. No results for input(s): LIPASE, AMYLASE in the last 168 hours. No results for input(s): AMMONIA in the last 168 hours. CBC:  Recent Labs Lab 05/30/16 1410 05/31/16 0550 06/01/16 0507 06/03/16 0543  WBC 16.6* 8.4 7.3 6.1  NEUTROABS 15.7*  --   --  4.5  HGB 13.3 10.8* 10.4* 11.0*  HCT 41.6 32.3* 33.6* 34.1*  MCV 91.4 89.5 89.1 90.2  PLT 240 243 245 230   Cardiac Enzymes:  Recent Labs Lab 05/30/16 1423  TROPONINI <0.03   BNP: Invalid input(s): POCBNP CBG:  Recent Labs Lab 06/02/16 0726 06/02/16 1151 06/02/16 1631 06/02/16 2022 06/03/16 0730  GLUCAP 198* 207* 322* 309* 135*   D-Dimer No results for input(s): DDIMER in the last 72 hours. Hgb A1c No results for input(s): HGBA1C in the last 72 hours. Lipid Profile No results for input(s): CHOL, HDL, LDLCALC, TRIG, CHOLHDL, LDLDIRECT in the last 72 hours. Thyroid function studies No results  for input(s): TSH, T4TOTAL, T3FREE, THYROIDAB in the last 72 hours.  Invalid input(s): FREET3 Anemia work up No results for input(s): VITAMINB12, FOLATE, FERRITIN, TIBC, IRON, RETICCTPCT in the last 72 hours. Urinalysis    Component Value Date/Time   COLORURINE AMBER (A) 05/28/2015 1529   APPEARANCEUR CLOUDY (A) 05/28/2015 1529   LABSPEC 1.021 05/28/2015 1529   PHURINE 5.5 05/28/2015 1529   GLUCOSEU >1000 (A) 05/28/2015 1529   HGBUR NEGATIVE 05/28/2015 1529   BILIRUBINUR SMALL (A) 05/28/2015 1529   KETONESUR NEGATIVE 05/28/2015 1529   PROTEINUR 30 (A) 05/28/2015 1529   UROBILINOGEN 1.0 01/17/2015 1627   NITRITE NEGATIVE 05/28/2015 1529   LEUKOCYTESUR NEGATIVE 05/28/2015 1529   Sepsis Labs Invalid input(s): PROCALCITONIN,  WBC,  LACTICIDVEN Microbiology No results found for this or any previous visit (from the past 240 hour(s)).   Time coordinating discharge: Over 30 minutes  SIGNED:   Rosita Fire, MD  [facility] 06/03/2016, 11:20 AM  If 7PM-7AM, please contact night-coverage [email or web address] Password [email or web address]

## 2016-06-03 NOTE — Progress Notes (Signed)
Discharge instructions given to pt, verbalized understanding. Left the unit in stable condition. 

## 2016-06-03 NOTE — Care Management Note (Signed)
Case Management Note  Patient Details  Name: Brenda [name] MRN: [id number] Date of Birth: Dec 13, 1950  Subjective/Objective:   Patient was admitted with pneumonia.                 Action/Plan: Case manager spoke with patient offered choice for Home Health PT. Referral was called to Stevie Kern, [place] Liaison. Patient has no DME needs.     Expected Discharge Date:  06/03/16               Expected Discharge Plan:  [place]  In-House Referral:  NA  Discharge planning Services  CM Consult  Post Acute Care Choice:  Home Health Choice offered to:  Patient  DME Arranged:    DME Agency:     HH Arranged:  PT [place]:  [place]  Status of Service:  Completed, signed off  If discussed at [place] of Stay Meetings, dates discussed:    Additional Comments:  Ninfa Meeker, RN 06/03/2016, 1:33 PM

## 2016-06-05 ENCOUNTER — Inpatient Hospital Stay: Payer: Medicare Other | Admitting: Acute Care

## 2016-06-05 ENCOUNTER — Other Ambulatory Visit: Payer: Self-pay | Admitting: Radiology

## 2016-06-06 ENCOUNTER — Ambulatory Visit (HOSPITAL_COMMUNITY)
Admission: RE | Admit: 2016-06-06 | Discharge: 2016-06-06 | Disposition: A | Discharge status: Home / Self Care | Payer: Medicare Other | Source: Ambulatory Visit | Attending: Radiology | Admitting: Radiology

## 2016-06-06 ENCOUNTER — Encounter (HOSPITAL_COMMUNITY): Payer: Self-pay

## 2016-06-06 ENCOUNTER — Inpatient Hospital Stay (HOSPITAL_COMMUNITY)
Admission: AD | Admit: 2016-06-06 | Discharge: 2016-06-11 | DRG: 166 | Disposition: A | Discharge status: Home Health | Payer: Medicare Other | Source: Ambulatory Visit | Attending: Internal Medicine | Admitting: Internal Medicine

## 2016-06-06 ENCOUNTER — Other Ambulatory Visit (HOSPITAL_COMMUNITY): Payer: Medicare Other

## 2016-06-06 DIAGNOSIS — Z791 Long term (current) use of non-steroidal anti-inflammatories (NSAID): Secondary | ICD-10-CM

## 2016-06-06 DIAGNOSIS — K219 Gastro-esophageal reflux disease without esophagitis: Secondary | ICD-10-CM | POA: Diagnosis present

## 2016-06-06 DIAGNOSIS — Z832 Family history of diseases of the blood and blood-forming organs and certain disorders involving the immune mechanism: Secondary | ICD-10-CM | POA: Diagnosis not present

## 2016-06-06 DIAGNOSIS — E43 Unspecified severe protein-calorie malnutrition: Secondary | ICD-10-CM | POA: Diagnosis present

## 2016-06-06 DIAGNOSIS — Z86718 Personal history of other venous thrombosis and embolism: Secondary | ICD-10-CM | POA: Diagnosis not present

## 2016-06-06 DIAGNOSIS — Z7902 Long term (current) use of antithrombotics/antiplatelets: Secondary | ICD-10-CM

## 2016-06-06 DIAGNOSIS — Z85810 Personal history of malignant neoplasm of tongue: Secondary | ICD-10-CM | POA: Diagnosis not present

## 2016-06-06 DIAGNOSIS — I252 Old myocardial infarction: Secondary | ICD-10-CM

## 2016-06-06 DIAGNOSIS — D649 Anemia, unspecified: Secondary | ICD-10-CM | POA: Diagnosis present

## 2016-06-06 DIAGNOSIS — Z7982 Long term (current) use of aspirin: Secondary | ICD-10-CM

## 2016-06-06 DIAGNOSIS — Z87891 Personal history of nicotine dependence: Secondary | ICD-10-CM | POA: Diagnosis not present

## 2016-06-06 DIAGNOSIS — R911 Solitary pulmonary nodule: Secondary | ICD-10-CM | POA: Diagnosis present

## 2016-06-06 DIAGNOSIS — Z955 Presence of coronary angioplasty implant and graft: Secondary | ICD-10-CM

## 2016-06-06 DIAGNOSIS — Z515 Encounter for palliative care: Secondary | ICD-10-CM | POA: Diagnosis not present

## 2016-06-06 DIAGNOSIS — Z923 Personal history of irradiation: Secondary | ICD-10-CM | POA: Diagnosis not present

## 2016-06-06 DIAGNOSIS — R1310 Dysphagia, unspecified: Secondary | ICD-10-CM

## 2016-06-06 DIAGNOSIS — M4854XA Collapsed vertebra, not elsewhere classified, thoracic region, initial encounter for fracture: Secondary | ICD-10-CM | POA: Diagnosis present

## 2016-06-06 DIAGNOSIS — J189 Pneumonia, unspecified organism: Secondary | ICD-10-CM | POA: Insufficient documentation

## 2016-06-06 DIAGNOSIS — J690 Pneumonitis due to inhalation of food and vomit: Principal | ICD-10-CM | POA: Diagnosis present

## 2016-06-06 DIAGNOSIS — IMO0002 Reserved for concepts with insufficient information to code with codable children: Secondary | ICD-10-CM | POA: Diagnosis present

## 2016-06-06 DIAGNOSIS — J181 Lobar pneumonia, unspecified organism: Secondary | ICD-10-CM

## 2016-06-06 DIAGNOSIS — M549 Dorsalgia, unspecified: Secondary | ICD-10-CM

## 2016-06-06 DIAGNOSIS — S22000A Wedge compression fracture of unspecified thoracic vertebra, initial encounter for closed fracture: Secondary | ICD-10-CM | POA: Diagnosis not present

## 2016-06-06 DIAGNOSIS — Z79899 Other long term (current) drug therapy: Secondary | ICD-10-CM

## 2016-06-06 DIAGNOSIS — Z7951 Long term (current) use of inhaled steroids: Secondary | ICD-10-CM | POA: Diagnosis not present

## 2016-06-06 DIAGNOSIS — E1121 Type 2 diabetes mellitus with diabetic nephropathy: Secondary | ICD-10-CM | POA: Diagnosis not present

## 2016-06-06 DIAGNOSIS — Z9981 Dependence on supplemental oxygen: Secondary | ICD-10-CM

## 2016-06-06 DIAGNOSIS — J441 Chronic obstructive pulmonary disease with (acute) exacerbation: Secondary | ICD-10-CM | POA: Diagnosis present

## 2016-06-06 DIAGNOSIS — Z794 Long term (current) use of insulin: Secondary | ICD-10-CM | POA: Diagnosis not present

## 2016-06-06 DIAGNOSIS — E1165 Type 2 diabetes mellitus with hyperglycemia: Secondary | ICD-10-CM | POA: Diagnosis present

## 2016-06-06 DIAGNOSIS — J9621 Acute and chronic respiratory failure with hypoxia: Secondary | ICD-10-CM | POA: Diagnosis present

## 2016-06-06 DIAGNOSIS — Z681 Body mass index (BMI) 19 or less, adult: Secondary | ICD-10-CM

## 2016-06-06 DIAGNOSIS — I2510 Atherosclerotic heart disease of native coronary artery without angina pectoris: Secondary | ICD-10-CM | POA: Diagnosis present

## 2016-06-06 DIAGNOSIS — J42 Unspecified chronic bronchitis: Secondary | ICD-10-CM | POA: Diagnosis not present

## 2016-06-06 DIAGNOSIS — J449 Chronic obstructive pulmonary disease, unspecified: Secondary | ICD-10-CM | POA: Diagnosis present

## 2016-06-06 DIAGNOSIS — E7800 Pure hypercholesterolemia, unspecified: Secondary | ICD-10-CM | POA: Diagnosis present

## 2016-06-06 DIAGNOSIS — Z833 Family history of diabetes mellitus: Secondary | ICD-10-CM

## 2016-06-06 DIAGNOSIS — C01 Malignant neoplasm of base of tongue: Secondary | ICD-10-CM | POA: Diagnosis not present

## 2016-06-06 DIAGNOSIS — Z8673 Personal history of transient ischemic attack (TIA), and cerebral infarction without residual deficits: Secondary | ICD-10-CM

## 2016-06-06 DIAGNOSIS — Z8249 Family history of ischemic heart disease and other diseases of the circulatory system: Secondary | ICD-10-CM | POA: Diagnosis not present

## 2016-06-06 DIAGNOSIS — Z7189 Other specified counseling: Secondary | ICD-10-CM | POA: Diagnosis not present

## 2016-06-06 DIAGNOSIS — R0600 Dyspnea, unspecified: Secondary | ICD-10-CM | POA: Diagnosis present

## 2016-06-06 DIAGNOSIS — E1151 Type 2 diabetes mellitus with diabetic peripheral angiopathy without gangrene: Secondary | ICD-10-CM | POA: Diagnosis present

## 2016-06-06 LAB — INFLUENZA PANEL BY PCR (TYPE A & B)
Influenza A By PCR: NEGATIVE
Influenza B By PCR: NEGATIVE

## 2016-06-06 LAB — CBC WITH DIFFERENTIAL/PLATELET
BASOS PCT: 0 %
Basophils Absolute: 0 10*3/uL (ref 0.0–0.1)
EOS ABS: 0.1 10*3/uL (ref 0.0–0.7)
Eosinophils Relative: 1 %
HEMATOCRIT: 33.7 % — AB (ref 36.0–46.0)
Hemoglobin: 10.9 g/dL — ABNORMAL LOW (ref 12.0–15.0)
LYMPHS ABS: 0.7 10*3/uL (ref 0.7–4.0)
Lymphocytes Relative: 6 %
MCH: 28.6 pg (ref 26.0–34.0)
MCHC: 32.3 g/dL (ref 30.0–36.0)
MCV: 88.5 fL (ref 78.0–100.0)
MONO ABS: 0.5 10*3/uL (ref 0.1–1.0)
MONOS PCT: 5 %
NEUTROS ABS: 9.4 10*3/uL — AB (ref 1.7–7.7)
Neutrophils Relative %: 88 %
Platelets: 209 10*3/uL (ref 150–400)
RBC: 3.81 MIL/uL — ABNORMAL LOW (ref 3.87–5.11)
RDW: 16.1 % — AB (ref 11.5–15.5)
WBC: 10.6 10*3/uL — ABNORMAL HIGH (ref 4.0–10.5)

## 2016-06-06 LAB — BASIC METABOLIC PANEL
Anion gap: 7 (ref 5–15)
BUN: 18 mg/dL (ref 6–20)
CALCIUM: 8.7 mg/dL — AB (ref 8.9–10.3)
CHLORIDE: 91 mmol/L — AB (ref 101–111)
CO2: 30 mmol/L (ref 22–32)
CREATININE: 0.74 mg/dL (ref 0.44–1.00)
GFR calc Af Amer: >60 mL/min (ref >60)
GFR calc non Af Amer: >60 mL/min (ref >60)
GLUCOSE: 279 mg/dL — AB (ref 65–99)
Potassium: 4.3 mmol/L (ref 3.5–5.1)
Sodium: 128 mmol/L — ABNORMAL LOW (ref 135–145)

## 2016-06-06 LAB — GLUCOSE, CAPILLARY
GLUCOSE-CAPILLARY: 235 mg/dL — AB (ref 65–99)
GLUCOSE-CAPILLARY: 136 mg/dL — AB (ref 65–99)
Glucose-Capillary: 284 mg/dL — ABNORMAL HIGH (ref 65–99)

## 2016-06-06 MED ORDER — POLYETHYLENE GLYCOL 3350 17 G PO PACK: 17.0000 g | PACK | Freq: Every day | ORAL | Status: DC | PRN

## 2016-06-06 MED ORDER — ALBUTEROL SULFATE HFA 108 (90 BASE) MCG/ACT IN AERS: 2.0000 | INHALATION_SPRAY | Freq: Four times a day (QID) | RESPIRATORY_TRACT | Status: DC | PRN

## 2016-06-06 MED ORDER — FLUTICASONE PROPIONATE 50 MCG/ACT NA SUSP
2.0000 | Freq: Every day | NASAL | Status: DC
  Administered 2016-06-08 – 2016-06-11 (×4): 2 via NASAL
  Filled 2016-06-06: qty 16

## 2016-06-06 MED ORDER — HEPARIN SODIUM (PORCINE) 5000 UNIT/ML IJ SOLN
5000.0000 [IU] | Freq: Three times a day (TID) | INTRAMUSCULAR | Status: DC
  Administered 2016-06-06 – 2016-06-09 (×10): 5000 [IU] via SUBCUTANEOUS
  Filled 2016-06-06 (×10): qty 1

## 2016-06-06 MED ORDER — CYCLOBENZAPRINE HCL 5 MG PO TABS: 5.0000 mg | ORAL_TABLET | Freq: Three times a day (TID) | ORAL | Status: DC | PRN

## 2016-06-06 MED ORDER — BISOPROLOL FUMARATE 5 MG PO TABS
2.5000 mg | ORAL_TABLET | Freq: Every day | ORAL | Status: DC
  Administered 2016-06-08 – 2016-06-11 (×3): 2.5 mg via ORAL
  Filled 2016-06-06 (×3): qty 1

## 2016-06-06 MED ORDER — ALBUTEROL SULFATE (2.5 MG/3ML) 0.083% IN NEBU: 2.5000 mg | INHALATION_SOLUTION | Freq: Four times a day (QID) | RESPIRATORY_TRACT | Status: DC | PRN

## 2016-06-06 MED ORDER — SENNOSIDES-DOCUSATE SODIUM 8.6-50 MG PO TABS: 1.0000 | ORAL_TABLET | Freq: Every evening | ORAL | Status: DC | PRN

## 2016-06-06 MED ORDER — PREDNISONE 20 MG PO TABS
20.0000 mg | ORAL_TABLET | Freq: Every day | ORAL | Status: DC
  Administered 2016-06-08 – 2016-06-10 (×3): 20 mg via ORAL
  Filled 2016-06-06 (×3): qty 1

## 2016-06-06 MED ORDER — SODIUM CHLORIDE 0.9 % IV SOLN
INTRAVENOUS | Status: DC
  Administered 2016-06-06: 14:00:00 via INTRAVENOUS

## 2016-06-06 MED ORDER — SODIUM CHLORIDE 0.9 % IV SOLN
INTRAVENOUS | Status: AC
  Administered 2016-06-06: 21:00:00 via INTRAVENOUS

## 2016-06-06 MED ORDER — ACETAMINOPHEN 325 MG PO TABS: 650.0000 mg | ORAL_TABLET | Freq: Four times a day (QID) | ORAL | Status: DC | PRN

## 2016-06-06 MED ORDER — OXYCODONE-ACETAMINOPHEN 5-325 MG PO TABS
1.0000 | ORAL_TABLET | Freq: Four times a day (QID) | ORAL | Status: DC | PRN
  Administered 2016-06-08 – 2016-06-09 (×3): 1 via ORAL
  Filled 2016-06-06 (×3): qty 1

## 2016-06-06 MED ORDER — PANTOPRAZOLE SODIUM 40 MG PO TBEC
40.0000 mg | DELAYED_RELEASE_TABLET | Freq: Every day | ORAL | Status: DC
  Administered 2016-06-08 – 2016-06-11 (×3): 40 mg via ORAL
  Filled 2016-06-06 (×3): qty 1

## 2016-06-06 MED ORDER — ORAL CARE MOUTH RINSE
15.0000 mL | Freq: Two times a day (BID) | OROMUCOSAL | Status: DC
  Administered 2016-06-06 – 2016-06-11 (×4): 15 mL via OROMUCOSAL

## 2016-06-06 MED ORDER — MAGNESIUM CITRATE PO SOLN: 1.0000 | Freq: Once | ORAL | Status: DC | PRN

## 2016-06-06 MED ORDER — OXYCODONE-ACETAMINOPHEN 7.5-325 MG PO TABS
2.0000 | ORAL_TABLET | Freq: Once | ORAL | Status: AC
  Administered 2016-06-06: 1 via ORAL
  Filled 2016-06-06: qty 1

## 2016-06-06 MED ORDER — LORATADINE 10 MG PO TABS
10.0000 mg | ORAL_TABLET | Freq: Every day | ORAL | Status: DC
  Administered 2016-06-08 – 2016-06-11 (×3): 10 mg via ORAL
  Filled 2016-06-06 (×3): qty 1

## 2016-06-06 MED ORDER — TIOTROPIUM BROMIDE MONOHYDRATE 18 MCG IN CAPS
18.0000 ug | ORAL_CAPSULE | Freq: Every day | RESPIRATORY_TRACT | Status: DC
  Administered 2016-06-07 – 2016-06-11 (×5): 18 ug via RESPIRATORY_TRACT
  Filled 2016-06-06 (×2): qty 5

## 2016-06-06 MED ORDER — NITROGLYCERIN 0.4 MG/SPRAY TL SOLN: 1.0000 | Status: DC | PRN

## 2016-06-06 MED ORDER — CEFAZOLIN SODIUM-DEXTROSE 2-4 GM/100ML-% IV SOLN
2.0000 g | INTRAVENOUS | Status: DC
  Filled 2016-06-06: qty 100

## 2016-06-06 MED ORDER — FLUTICASONE FUROATE-VILANTEROL 100-25 MCG/INH IN AEPB
1.0000 | INHALATION_SPRAY | Freq: Every day | RESPIRATORY_TRACT | Status: DC
  Administered 2016-06-07 – 2016-06-11 (×5): 1 via RESPIRATORY_TRACT
  Filled 2016-06-06: qty 28

## 2016-06-06 MED ORDER — BUPROPION HCL ER (XL) 150 MG PO TB24
300.0000 mg | ORAL_TABLET | Freq: Every day | ORAL | Status: DC
  Administered 2016-06-08 – 2016-06-11 (×3): 300 mg via ORAL
  Filled 2016-06-06 (×3): qty 2

## 2016-06-06 MED ORDER — RESOURCE THICKENUP CLEAR PO POWD
ORAL | Status: DC | PRN
  Filled 2016-06-06: qty 125

## 2016-06-06 MED ORDER — BISACODYL 5 MG PO TBEC: 5.0000 mg | DELAYED_RELEASE_TABLET | Freq: Every day | ORAL | Status: DC | PRN

## 2016-06-06 MED ORDER — GLUCERNA SHAKE PO LIQD
237.0000 mL | Freq: Two times a day (BID) | ORAL | Status: DC
  Filled 2016-06-06 (×2): qty 237

## 2016-06-06 MED ORDER — VANCOMYCIN HCL 500 MG IV SOLR
500.0000 mg | Freq: Two times a day (BID) | INTRAVENOUS | Status: DC
  Administered 2016-06-06: 500 mg via INTRAVENOUS
  Filled 2016-06-06 (×2): qty 500

## 2016-06-06 MED ORDER — MORPHINE SULFATE (PF) 2 MG/ML IV SOLN
2.0000 mg | INTRAVENOUS | Status: DC | PRN
  Administered 2016-06-06 – 2016-06-07 (×3): 2 mg via INTRAVENOUS
  Filled 2016-06-06 (×3): qty 1

## 2016-06-06 MED ORDER — TRAMADOL HCL 50 MG PO TABS: 50.0000 mg | ORAL_TABLET | Freq: Four times a day (QID) | ORAL | Status: DC | PRN

## 2016-06-06 MED ORDER — INSULIN ASPART 100 UNIT/ML ~~LOC~~ SOLN
0.0000 [IU] | Freq: Three times a day (TID) | SUBCUTANEOUS | Status: DC
  Administered 2016-06-08: 5 [IU] via SUBCUTANEOUS
  Administered 2016-06-08: 7 [IU] via SUBCUTANEOUS
  Administered 2016-06-09: 5 [IU] via SUBCUTANEOUS
  Administered 2016-06-09: 1 [IU] via SUBCUTANEOUS
  Administered 2016-06-09: 7 [IU] via SUBCUTANEOUS
  Administered 2016-06-10 – 2016-06-11 (×3): 3 [IU] via SUBCUTANEOUS
  Administered 2016-06-11: 1 [IU] via SUBCUTANEOUS

## 2016-06-06 MED ORDER — GUAIFENESIN ER 600 MG PO TB12
1200.0000 mg | ORAL_TABLET | Freq: Two times a day (BID) | ORAL | Status: DC
  Administered 2016-06-08 – 2016-06-11 (×6): 1200 mg via ORAL
  Filled 2016-06-06 (×6): qty 2

## 2016-06-06 MED ORDER — DEXTROSE 5 % IV SOLN
1.0000 g | Freq: Three times a day (TID) | INTRAVENOUS | Status: DC
  Administered 2016-06-06 – 2016-06-07 (×2): 1 g via INTRAVENOUS
  Filled 2016-06-06 (×3): qty 1

## 2016-06-06 MED ORDER — IBUPROFEN 200 MG PO TABS: 400.0000 mg | ORAL_TABLET | Freq: Four times a day (QID) | ORAL | Status: DC | PRN

## 2016-06-06 MED ORDER — ROSUVASTATIN CALCIUM 20 MG PO TABS
20.0000 mg | ORAL_TABLET | Freq: Every day | ORAL | Status: DC
  Administered 2016-06-08 – 2016-06-11 (×3): 20 mg via ORAL
  Filled 2016-06-06 (×3): qty 1

## 2016-06-06 MED ORDER — TIOTROPIUM BROMIDE MONOHYDRATE 2.5 MCG/ACT IN AERS: 2.0000 | INHALATION_SPRAY | Freq: Every day | RESPIRATORY_TRACT | Status: DC

## 2016-06-06 MED ORDER — OMEGA-3 FATTY ACIDS 1000 MG PO CAPS: 1.0000 g | ORAL_CAPSULE | Freq: Two times a day (BID) | ORAL | Status: DC

## 2016-06-06 MED ORDER — IPRATROPIUM-ALBUTEROL 0.5-2.5 (3) MG/3ML IN SOLN: 3.0000 mL | Freq: Two times a day (BID) | RESPIRATORY_TRACT | Status: DC | PRN

## 2016-06-06 MED ORDER — POLYETHYLENE GLYCOL 3350 17 GM/SCOOP PO POWD: 17.0000 g | Freq: Two times a day (BID) | ORAL | Status: DC | PRN

## 2016-06-06 MED ORDER — OMEGA-3-ACID ETHYL ESTERS 1 G PO CAPS
1.0000 g | ORAL_CAPSULE | Freq: Two times a day (BID) | ORAL | Status: DC
  Administered 2016-06-08: 1 g via ORAL
  Filled 2016-06-06: qty 1

## 2016-06-06 NOTE — H&P (Signed)
Referring Physician(s): Vann,J  Supervising Physician: Sandi Mariscal  Patient Status:  [facility] OP  Chief Complaint:  Back pain,T7/T12 compression fractures  Subjective: Pt familiar to IR service from recent consultation on 05/31/16 regarding treatment options for acute T7 compression fracture. She is a 66 y.o. 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  She has been coughing a lot secondary to her PNA and woke up from her nap on Christmas day with severe back pain between her shoulder blades.  She is having significant pain simply with breathing, but almost unable to cough secondary to pain. She was placed on antibiotic therapy and discharged home on 06/03/16 by [facility]. Plavix was held starting 12/30 in anticipation of KP as OP.  She had imaging during hospitalization and was found to have a new T7, T12 compression fractures. She was deemed an appropriate candidate for T7, possible T12 KP and presents today for the procedure. She continues to have sig coughing and dyspnea with exertion.  Past Medical History:  Diagnosis Date  . Arthritis    "hands" (11/26/2012)  . CAD S/P percutaneous coronary angioplasty 1999; 2001;2003, 10/2014   a) BMS- mCx; b) re-do PCI for prog of Dz (NIR BMS 3.5 x 12); c) staged PCI: p-mRCA Express II BMS 3.0x24 & 3.5 x 16 --> p-mLAD Cyper DES 3.0 x 13 & PTCA of D2 (2.5 balloon); 10/2015 PCI p-m LAD Promus DES 2.75 x 38 (covers pre&post-stent ISR), POBA of ostial rPDA  . Cancer of base of tongue ([place]) 2010   "& lymph nodes @ right neck" (11/26/2012  . Carotid artery occlusion   . Chronic bronchitis ([place])   . COPD (chronic obstructive pulmonary disease) with emphysema ([place])   . Depression   . GERD (gastroesophageal reflux disease)   . H/O hiatal hernia   . History of blood transfusion   . History of DVT (deep vein thrombosis)   . Hx  of radiation therapy   . Hx of radiation therapy 01/16/09 - 03/06/09   base of tongue, right neck node  . Hypercholesteremia   . Myocardial infarction 1999, 2003  . PAD (peripheral artery disease) ([name])    status post bilateral femoropopliteal bypass grafting --> Dr. Sherren Mocha Early; -  02/2013 Revision of R Fem-AKPop to BK Pop.  . Patent foramen ovale February 2013   Small. Small PFO noted on TTE for TIA/CVA R-L + Bubble study with valsalva  . Pneumonia   . Stroke [facility])    left side weakness remains (11/26/2012)  . Transient ischemic attack (TIA) February 2013  . Type II diabetes mellitus ([place])    Past Surgical History:  Procedure Laterality Date  . ANKLE FRACTURE SURGERY Right 2007   "got a metal plate and 8 screws in it" (11/26/2012)  . APPENDECTOMY  1962  . BYPASS GRAFT POPLITEAL TO POPLITEAL Right 02/05/2013   Procedure: BYPASS GRAFT ABOVE KNEE POPLITEAL TO BELOW KNEE POPLITEAL WITH SMALL SAPHANOUS VEIN;  Surgeon: Rosetta Posner, MD;  Location: [place];  Service: Vascular;  Laterality: Right;  . CARDIAC CATHETERIZATION  1999; 10/2005   5/'07: mildLAD 20-30%, patent stent, diags- no Dz. RCA 2 BMS patent ~20% ISR.   Marland Kitchen CARDIAC CATHETERIZATION N/A 10/06/2015   Procedure: Left Heart Cath and Coronary Angiography;  Surgeon: Leonie Man, MD;  Location: [place] CV LAB;  Service: Cardiovascular; p-mLAD 99% pre-stent,  60% ISR & 80% post-stent, o-RPDA 90%, ~10% ISR in 2 RCA (Prox & mid) BMS,~20% ISR in pCx BMS with 5% OM2 BMS.    Marland Kitchen CARDIAC CATHETERIZATION N/A 10/06/2015   Procedure: Coronary Stent Intervention;  Surgeon: Leonie Man, MD;  Location: [place] CV LAB;  Service: Cardiovascular; p-m LAD Promus DES 2.75 x 38 (tapered post-dilatoin)  . CARDIAC CATHETERIZATION N/A 10/06/2015   Procedure: Coronary Balloon Angioplasty;  Surgeon: Leonie Man, MD;  Location: [place] CV LAB;  Service: Cardiovascular;  POBA of Ostial rPDA.  Marland Kitchen CARPAL TUNNEL RELEASE Right 1980's?  Marland Kitchen CATARACT EXTRACTION W/  INTRAOCULAR LENS IMPLANT Right 2011  . [place]  . CORONARY ANGIOPLASTY WITH STENT PLACEMENT  1999--2005   1999 - OM2 BMS; 2001: p-mCx NIR BMS 3.5 x 12; 10/2001: p-mRCA Express II BMS - 3.5 x 16 - 3.0 x 24.   . ESOPHAGOGASTRODUODENOSCOPY  12/20/2011   Procedure: ESOPHAGOGASTRODUODENOSCOPY (EGD);  Surgeon: Beryle Beams, MD;  Location: [facility];  Service: Endoscopy;  Laterality: N/A;  EGD with balloon dilation  . EYE SURGERY    . FEMORAL BYPASS  1999; 2000   Bilateral Fem-AK POP bypass.   Marland Kitchen PERIPHERAL VASCULAR CATHETERIZATION N/A 12/01/2015   Procedure: Abdominal Aortogram w/Lower Extremity;  Surgeon: Elam Dutch, MD;  Location: [place] [facility];  Service: Cardiovascular;  Laterality: N/A;  . PERIPHERAL VASCULAR CATHETERIZATION Right 12/22/2015   Procedure: Peripheral Vascular Intervention;  Surgeon: Rosetta Posner, MD;  Location: [place] CV [facility];  Service: Cardiovascular;  Laterality: Right;  EXternal iliac  . SAVORY DILATION  12/20/2011   Procedure: SAVORY DILATION;  Surgeon: Beryle Beams, MD;  Location[facility];  Service: Endoscopy;  Laterality: N/A;  . TEE WITHOUT CARDIOVERSION  07/26/2011   Procedure: TRANSESOPHAGEAL ECHOCARDIOGRAM (TEE);  Surgeon: Pixie Casino, MD;  [facility];  Service: Cardiovascular;  Laterality: N/A;  . TONSILLECTOMY  1969  . TRANSTHORACIC ECHOCARDIOGRAM  March 2016   Normal LV size and function EF 55-60%. No regional wall motion and amount is. No significant valvular lesions.  . TUBAL LIGATION  1988  . VAGINAL HYSTERECTOMY  1988     Allergies: Tape; Isosorbide; and Nicoderm [nicotine]  Medications: Prior to Admission medications   Medication Sig Start Date End Date Taking? Authorizing Provider  acetaminophen (TYLENOL) 325 MG tablet Take 2 tablets (650 mg total) by mouth every 6 (six) hours as needed. Patient taking differently: Take 650 mg by mouth every 6 (six) hours as needed for mild pain.  05/01/16  Yes [name], PA-C  amoxicillin-clavulanate (AUGMENTIN) 875-125 MG tablet Take 1 tablet by mouth 2 (two) times daily. 06/03/16 06/10/16 Yes Dron Tanna Furry, MD  bisoprolol (ZEBETA) 5 MG tablet Take 0.5 tablets (2.5 mg total) by mouth daily. 05/15/16  Yes Orson Eva, MD  buPROPion (WELLBUTRIN XL) 300 MG 24 hr tablet Take 300 mg by mouth daily.  09/19/15  Yes Historical Provider, MD  cyclobenzaprine (FLEXERIL) 5 MG tablet Take 1-2 tablets (5-10 mg total) by mouth 3 (three) times daily as needed for muscle spasms. 05/09/16  Yes Naiping Ephriam Jenkins, MD  feeding supplement, GLUCERNA SHAKE, (GLUCERNA SHAKE) LIQD Take 237 mLs by mouth 2 (two) times daily between meals. 04/09/16  Yes Donita Brooks, NP  fish oil-omega-3 fatty acids 1000 MG capsule Take 1 g by mouth 2 (two) times daily.    Yes Historical Provider, MD  fluticasone (FLONASE) 50 MCG/ACT nasal spray Place 2 sprays into both  nostrils daily. 06/02/15  Yes Debbe Odea, MD  fluticasone furoate-vilanterol (BREO ELLIPTA) 100-25 MCG/INH AEPB Inhale 1 puff into the lungs daily. 09/13/15  Yes Tammy S Parrett, NP  guaiFENesin (MUCINEX) 600 MG 12 hr tablet Take 1,200 mg by mouth 2 (two) times daily.   Yes Historical Provider, MD  ibuprofen (ADVIL,MOTRIN) 200 MG tablet Take 400 mg by mouth every 6 (six) hours as needed (Pain).   Yes Historical Provider, MD  insulin glargine (LANTUS) 100 UNIT/ML injection Inject 0.1 mLs (10 Units total) into the skin at bedtime. 05/14/16  Yes Orson Eva, MD  insulin lispro (HUMALOG) 100 UNIT/ML injection Inject 3 Units into the skin 3 (three) times daily with meals as needed for high blood sugar.    Yes Historical Provider, MD  ipratropium-albuterol (DUONEB) 0.5-2.5 (3) MG/3ML SOLN Take 3 mLs by nebulization every 6 (six) hours. Patient taking differently: Take 3 mLs by nebulization 2 (two) times daily as needed (for wheezingt or shortness of breath).  06/02/15  Yes Debbe Odea, MD  loratadine (CLARITIN) 10 MG tablet Take 1 tablet (10 mg  total) by mouth daily. 06/02/15  Yes Debbe Odea, MD  Maltodextrin-Xanthan Gum ([place]) POWD Use with thin liquids to make nectar thickened consistency 05/14/16  Yes Orson Eva, MD  nitroGLYCERIN (NITROLINGUAL) 0.4 MG/SPRAY spray Place 1 spray under the tongue every 5 (five) minutes as needed for chest pain. 01/20/15  Yes Belkys A Regalado, MD  omeprazole (PRILOSEC) 20 MG capsule Take 20 mg by mouth daily.    Yes Historical Provider, MD  oxyCODONE-acetaminophen (PERCOCET/ROXICET) 5-325 MG tablet Take 1 tablet by mouth every 6 (six) hours as needed for moderate pain or severe pain. 06/03/16  Yes Dron Tanna Furry, MD  polyethylene glycol powder (GLYCOLAX/MIRALAX) powder Take 17 g by mouth 2 (two) times daily. 05/01/16  Yes [name], PA-C  predniSONE (DELTASONE) 10 MG tablet Take 4 tabs  daily with food x 4 days, then 3 tabs daily x 4 days, then 2 tabs daily x 4 days, then 1 tab daily x4 days then stop. #40 05/24/16  Yes Erick Colace, NP  rosuvastatin (CRESTOR) 20 MG tablet Take 20 mg by mouth daily.   Yes Historical Provider, MD  SPIRIVA RESPIMAT 2.5 MCG/ACT AERS 2 puffs daily. 10/24/15  Yes Historical Provider, MD  traMADol (ULTRAM) 50 MG tablet Take 1 tablet (50 mg total) by mouth every 6 (six) hours as needed for moderate pain or severe pain. 05/09/16  Yes Naiping Ephriam Jenkins, MD  albuterol (PROVENTIL HFA;VENTOLIN HFA) 108 (90 BASE) MCG/ACT inhaler Inhale 2 puffs into the lungs every 6 (six) hours as needed for wheezing or shortness of breath. 01/20/15   Belkys A Regalado, MD  aspirin EC 81 MG tablet Take 1 tablet (81 mg total) by mouth daily. 10/07/15   Thurnell Lose, MD  clopidogrel (PLAVIX) 75 MG tablet Take 1 tablet (75 mg total) by mouth daily. Please don't take plavix until you have kyphoplasty. May resume after the procedure, please talk your PCP. 06/10/16   Dron Tanna Furry, MD     Vital Signs: BP (!) 117/53 (BP Location: Left Arm)   Pulse 86   Temp 99.1 F (37.3 C)  (Oral)   Resp 16   LMP  (LMP Unknown)   SpO2 90%   Physical Exam awake/alert; c/o mod mid to lower back pain; chest- dim BS throughout; heart- nl rate, occ ectopy noted; abd- soft,+BS,NT; LE- no edema; mod to severe mid back pain, worse  with movement  Imaging: No results found.  Labs:  CBC:  Recent Labs  05/31/16 0550 06/01/16 0507 06/03/16 0543 06/06/16 1249  WBC 8.4 7.3 6.1 10.6*  HGB 10.8* 10.4* 11.0* 10.9*  HCT 32.3* 33.6* 34.1* 33.7*  PLT 243 245 230 209    COAGS:  Recent Labs  10/02/15 0951 10/05/15 1420 06/03/16 0543  INR  --  0.92 0.96  APTT 35  --   --     BMP:  Recent Labs  05/24/16 0531 05/30/16 1423 05/30/16 1527 05/31/16 0550 06/06/16 1249  NA 136 131*  --  134* 128*  K 3.3* 5.4* 5.2* 4.6 4.3  CL 97* 88*  --  93* 91*  CO2 32 36*  --  34* 30  GLUCOSE 58* 485*  --  197* 279*  BUN 18 19  --  22* 18  CALCIUM 8.6* 9.4  --  8.7* 8.7*  CREATININE 0.61 0.86  --  0.65 0.74  GFRNONAA >60 >60  --  >60 >60  GFRAA >60 >60  --  >60 >60    LIVER FUNCTION TESTS:  Recent Labs  10/05/15 1420 04/03/16 1136 05/10/16 0323 05/20/16 1319  BILITOT 1.0 0.7 1.7* 0.7  AST '22 16 20 '$ 14*  ALT '18 19 16 '$ 12*  ALKPHOS 135* 115 175* 161*  PROT 6.9 6.8 5.0* 5.2*  ALBUMIN 3.7 3.7 2.3* 2.2*    Assessment and Plan:   66 y.o. 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  She has been coughing a lot secondary to her PNA and woke up from her nap on Christmas day with severe back pain between her shoulder blades.  She is having significant pain simply with breathing, but almost unable to cough secondary to pain. She was placed on antibiotic therapy and discharged home on 06/03/16 by TRH. Plavix was held starting 12/30 in anticipation of KP as OP.  She had imaging during hospitalization and was found to have a new T7, T12 compression  fractures. She was deemed an appropriate candidate for T7, possible T12 KP and presented today for the procedure. She continues to have sig coughing and dyspnea with exertion. F/u CXR today revealed persistent rt lung PNA. Her O2 sats on 3 liters N/C was 90%. Case d/w Dr. Pascal Lux as well as Dr. Lake Bells (pts pulmonologist). Decision made to postpone KP for now and have [facility] admit pt for further tx/pulm toilet/antbx therapy with hopes that once PNA resolves can possibly proceed with T7/12 KP. Above plans d/w pt/daughter.         Electronically Signed: D. Rowe Robert 06/06/2016, 1:55 PM   I spent a total of 25 minutes at the the patient's bedside AND on the patient's hospital floor or unit, greater than 50% of which was counseling/coordinating care for T7, possible T12 kyphoplasty

## 2016-06-06 NOTE — Progress Notes (Signed)
Spoke with Dr. Pascal Lux about direct admit of this patient.  66 y.o.femalewith a past medical history significant for COPD on nightly O2, CAD s/p PCI, Tongue cancer s/p rad tx c/b dysphagia and recurrent aspiration pneumonia, and PVDrecently discharged from the hospital after a four-day admission on 06/03/2016 at which time she was treated for aspiration pneumonia. The patient was scheduled for kyphoplasty for vertebral fractures, but she was discharged prior to the procedure as she was medically stable. She presented to IR for kyphoplasty today. Patient was noted to have oxygen saturation in the low 90s with some shortness of breath. Chest x-ray was done and showed progression of her pneumonia. As a result direct admission was requested. Dr. Pascal Lux requested to leave the patient off of Plavix for possible kyphoplasty early part of next week if patient is medically tuned up enough for possible kyphoplasty at that time. The patient's vitals were stable without any increased work of breathing. She was except upper telemetry bed.  DTat

## 2016-06-06 NOTE — ED Provider Notes (Signed)
[facility] DEPT Provider Note   CSN: [id number] Arrival date & time: 05/30/16  1229     History   Chief Complaint Chief Complaint  Patient presents with  . Shortness of Breath    HPI Brenda [name] is a 66 y.o. female.  HPI Patient presents to the emergency department with shortness of breath.  Patient states that she has been having increasing shortness of breath over the last few weeks.  Patient states that she normally takes albuterol inhalers and nebulized treatments at home.  She states that nothing seems make the condition better or worseThe patient denies chest pain,  headache,blurred vision, neck pain, fever, cough, numbness, dizziness, anorexia, edema, abdominal pain, nausea, vomiting, diarrhea, rash, back pain, dysuria, hematemesis, bloody stool, near syncope, or syncope. Past Medical History:  Diagnosis Date  . Arthritis    "hands" (11/26/2012)  . CAD S/P percutaneous coronary angioplasty 1999; 2001;2003, 10/2014   a) BMS- mCx; b) re-do PCI for prog of Dz (NIR BMS 3.5 x 12); c) staged PCI: p-mRCA Express II BMS 3.0x24 & 3.5 x 16 --> p-mLAD Cyper DES 3.0 x 13 & PTCA of D2 (2.5 balloon); 10/2015 PCI p-m LAD Promus DES 2.75 x 38 (covers pre&post-stent ISR), POBA of ostial rPDA  . Cancer of base of tongue ([place]) 2010   "& lymph nodes @ right neck" (11/26/2012  . Carotid artery occlusion   . Chronic bronchitis ([place])   . COPD (chronic obstructive pulmonary disease) with emphysema ([place])   . Depression   . GERD (gastroesophageal reflux disease)   . H/O hiatal hernia   . History of blood transfusion   . History of DVT (deep vein thrombosis)   . Hx of radiation therapy   . Hx of radiation therapy 01/16/09 - 03/06/09   base of tongue, right neck node  . Hypercholesteremia   . Myocardial infarction 1999, 2003  . PAD (peripheral artery disease) ([name])    status post bilateral femoropopliteal bypass grafting --> Dr. Sherren Mocha Early; -  02/2013 Revision of R Fem-AKPop to BK Pop.  .  Patent foramen ovale February 2013   Small. Small PFO noted on TTE for TIA/CVA R-L + Bubble study with valsalva  . Pneumonia   . Stroke [facility])    left side weakness remains (11/26/2012)  . Transient ischemic attack (TIA) February 2013  . Type II diabetes mellitus [facility])     Patient Active Problem List   Diagnosis Date Noted  . Closed compression fracture of thoracic vertebra ([place])   . Recurrent aspiration pneumonia ([place]) 05/30/2016  . Aspiration pneumonia of right lower lobe ([place]) 05/11/2016  . Sepsis, unspecified organism ([place]) 05/10/2016  . Sepsis ([place]) 05/10/2016  . Lobar pneumonia ([place])   . Constipation 05/06/2016  . Protein-calorie malnutrition, severe 04/05/2016  . COPD with acute exacerbation ([place]) 04/03/2016  . Ulcer of right leg ([place]) 11/09/2015  . Hypotension 10/26/2015  . Angina pectoris ([place]) 10/06/2015  . CAD S/P percutaneous coronary angioplasty   . Acute on chronic respiratory failure with hypoxia ([place]) 10/05/2015  . Protein-calorie malnutrition, moderate ([place]) 10/05/2015  . Depression 10/05/2015  . GERD (gastroesophageal reflux disease) 10/05/2015  . Dysphagia with aspiration 06/02/2015  . Thrush 05/18/2015  . Chronic hypoxemic respiratory failure ([place]) 11/04/2014  . COPD exacerbation ([place])   . Alcohol abuse   . CAP (community acquired pneumonia) 08/14/2014  . Pain of left lower extremity- and Right 11/23/2013  . Fatigue 08/23/2013  . Aftercare following surgery of the circulatory system, [place] 02/23/2013  .  Peripheral vascular disease, unspecified ([place]) 01/26/2013  . COPD (chronic obstructive pulmonary disease) ([place]) 12/24/2012  . Claudication in peripheral vascular disease ([place]) 11/27/2012  . PVD-Hx of BFBPG '99 and '00. Dopplers OK 6/13   . Cancer of base of tongue- radiation 2010   . Hx of radiation therapy   . DM (diabetes mellitus), type 2, uncontrolled ([place]) 07/23/2011  . TIA (transient ischemic attack)-2013 07/23/2011  . History of CVA (cerebrovascular  accident) 07/23/2011  . Tobacco abuse 07/23/2011  . Hyperlipidemia with target LDL less than 70 07/23/2011  . CAD: Stents in p-mRCA, p & m Cx, p-mLAD 02/01/1998    Past Surgical History:  Procedure Laterality Date  . ANKLE FRACTURE SURGERY Right 2007   "got a metal plate and 8 screws in it" (11/26/2012)  . APPENDECTOMY  1962  . BYPASS GRAFT POPLITEAL TO POPLITEAL Right 02/05/2013   Procedure: BYPASS GRAFT ABOVE KNEE POPLITEAL TO BELOW KNEE POPLITEAL WITH SMALL SAPHANOUS VEIN;  Surgeon: Rosetta Posner, MD;  Location: [place];  Service: Vascular;  Laterality: Right;  . CARDIAC CATHETERIZATION  1999; 10/2005   5/'07: mildLAD 20-30%, patent stent, diags- no Dz. RCA 2 BMS patent ~20% ISR.   Marland Kitchen CARDIAC CATHETERIZATION N/A 10/06/2015   Procedure: Left Heart Cath and Coronary Angiography;  Surgeon: Leonie Man, MD;  Location: [place] CV LAB;  Service: Cardiovascular; p-mLAD 99% pre-stent, 60% ISR & 80% post-stent, o-RPDA 90%, ~10% ISR in 2 RCA (Prox & mid) BMS,~20% ISR in pCx BMS with 5% OM2 BMS.    Marland Kitchen CARDIAC CATHETERIZATION N/A 10/06/2015   Procedure: Coronary Stent Intervention;  Surgeon: Leonie Man, MD;  Location: [place] CV LAB;  Service: Cardiovascular; p-m LAD Promus DES 2.75 x 38 (tapered post-dilatoin)  . CARDIAC CATHETERIZATION N/A 10/06/2015   Procedure: Coronary Balloon Angioplasty;  Surgeon: Leonie Man, MD;  Location: [place] CV LAB;  Service: Cardiovascular;  POBA of Ostial rPDA.  Marland Kitchen CARPAL TUNNEL RELEASE Right 1980's?  Marland Kitchen CATARACT EXTRACTION W/ INTRAOCULAR LENS IMPLANT Right 2011  . [place]  . CORONARY ANGIOPLASTY WITH STENT PLACEMENT  1999--2005   1999 - OM2 BMS; 2001: p-mCx NIR BMS 3.5 x 12; 10/2001: p-mRCA Express II BMS - 3.5 x 16 - 3.0 x 24.   . ESOPHAGOGASTRODUODENOSCOPY  12/20/2011   Procedure: ESOPHAGOGASTRODUODENOSCOPY (EGD);  Surgeon: Beryle Beams, MD;  Location: [facility];  Service: Endoscopy;  Laterality: N/A;  EGD with balloon dilation  . EYE  SURGERY    . FEMORAL BYPASS  1999; 2000   Bilateral Fem-AK POP bypass.   Marland Kitchen PERIPHERAL VASCULAR CATHETERIZATION N/A 12/01/2015   Procedure: Abdominal Aortogram w/Lower Extremity;  Surgeon: Elam Dutch, MD;  Location: [place] CV LAB;  Service: Cardiovascular;  Laterality: N/A;  . PERIPHERAL VASCULAR CATHETERIZATION Right 12/22/2015   Procedure: Peripheral Vascular Intervention;  Surgeon: Rosetta Posner, MD;  Location: [place] CV LAB;  Service: Cardiovascular;  Laterality: Right;  EXternal iliac  . SAVORY DILATION  12/20/2011   Procedure: SAVORY DILATION;  Surgeon: Beryle Beams, MD;  Location: [facility];  Service: Endoscopy;  Laterality: N/A;  . TEE WITHOUT CARDIOVERSION  07/26/2011   Procedure: TRANSESOPHAGEAL ECHOCARDIOGRAM (TEE);  Surgeon: Pixie Casino, MD;  Location[facility];  Service: Cardiovascular;  Laterality: N/A;  . TONSILLECTOMY  1969  . TRANSTHORACIC ECHOCARDIOGRAM  March 2016   Normal LV size and function EF 55-60%. No regional wall motion and amount is. No significant valvular lesions.  . TUBAL  LIGATION  1988  . VAGINAL HYSTERECTOMY  1988    OB History    No data available       Home Medications    Prior to Admission medications   Medication Sig Start Date End Date Taking? Authorizing Provider  acetaminophen (TYLENOL) 325 MG tablet Take 2 tablets (650 mg total) by mouth every 6 (six) hours as needed. Patient taking differently: Take 650 mg by mouth every 6 (six) hours as needed for mild pain.  05/01/16  Yes [name], PA-C  albuterol (PROVENTIL HFA;VENTOLIN HFA) 108 (90 BASE) MCG/ACT inhaler Inhale 2 puffs into the lungs every 6 (six) hours as needed for wheezing or shortness of breath. 01/20/15  Yes Belkys A Regalado, MD  aspirin EC 81 MG tablet Take 1 tablet (81 mg total) by mouth daily. 10/07/15  Yes Thurnell Lose, MD  bisoprolol (ZEBETA) 5 MG tablet Take 0.5 tablets (2.5 mg total) by mouth daily. 05/15/16  Yes Orson Eva, MD  buPROPion  (WELLBUTRIN XL) 300 MG 24 hr tablet Take 300 mg by mouth daily.  09/19/15  Yes Historical Provider, MD  cyclobenzaprine (FLEXERIL) 5 MG tablet Take 1-2 tablets (5-10 mg total) by mouth 3 (three) times daily as needed for muscle spasms. 05/09/16  Yes Naiping Ephriam Jenkins, MD  feeding supplement, GLUCERNA SHAKE, (GLUCERNA SHAKE) LIQD Take 237 mLs by mouth 2 (two) times daily between meals. 04/09/16  Yes Donita Brooks, NP  fish oil-omega-3 fatty acids 1000 MG capsule Take 1 g by mouth 2 (two) times daily.    Yes Historical Provider, MD  fluticasone (FLONASE) 50 MCG/ACT nasal spray Place 2 sprays into both nostrils daily. 06/02/15  Yes Debbe Odea, MD  fluticasone furoate-vilanterol (BREO ELLIPTA) 100-25 MCG/INH AEPB Inhale 1 puff into the lungs daily. 09/13/15  Yes Tammy S Parrett, NP  guaiFENesin (MUCINEX) 600 MG 12 hr tablet Take 1,200 mg by mouth 2 (two) times daily.   Yes Historical Provider, MD  ibuprofen (ADVIL,MOTRIN) 200 MG tablet Take 400 mg by mouth every 6 (six) hours as needed (Pain).   Yes Historical Provider, MD  insulin glargine (LANTUS) 100 UNIT/ML injection Inject 0.1 mLs (10 Units total) into the skin at bedtime. 05/14/16  Yes Orson Eva, MD  insulin lispro (HUMALOG) 100 UNIT/ML injection Inject 3 Units into the skin 3 (three) times daily with meals as needed for high blood sugar.    Yes Historical Provider, MD  ipratropium-albuterol (DUONEB) 0.5-2.5 (3) MG/3ML SOLN Take 3 mLs by nebulization every 6 (six) hours. Patient taking differently: Take 3 mLs by nebulization 2 (two) times daily as needed (for wheezingt or shortness of breath).  06/02/15  Yes Debbe Odea, MD  loratadine (CLARITIN) 10 MG tablet Take 1 tablet (10 mg total) by mouth daily. 06/02/15  Yes Debbe Odea, MD  Maltodextrin-Xanthan Gum ([place]) POWD Use with thin liquids to make nectar thickened consistency 05/14/16  Yes Orson Eva, MD  nitroGLYCERIN (NITROLINGUAL) 0.4 MG/SPRAY spray Place 1 spray under the tongue  every 5 (five) minutes as needed for chest pain. 01/20/15  Yes Belkys A Regalado, MD  omeprazole (PRILOSEC) 20 MG capsule Take 20 mg by mouth daily.    Yes Historical Provider, MD  polyethylene glycol powder (GLYCOLAX/MIRALAX) powder Take 17 g by mouth 2 (two) times daily. 05/01/16  Yes [name], PA-C  predniSONE (DELTASONE) 10 MG tablet Take 4 tabs  daily with food x 4 days, then 3 tabs daily x 4 days, then 2 tabs daily x 4  days, then 1 tab daily x4 days then stop. #40 05/24/16  Yes Erick Colace, NP  rosuvastatin (CRESTOR) 20 MG tablet Take 20 mg by mouth daily.   Yes Historical Provider, MD  SPIRIVA RESPIMAT 2.5 MCG/ACT AERS 2 puffs daily. 10/24/15  Yes Historical Provider, MD  traMADol (ULTRAM) 50 MG tablet Take 1 tablet (50 mg total) by mouth every 6 (six) hours as needed for moderate pain or severe pain. 05/09/16  Yes Leandrew Koyanagi, MD  amoxicillin-clavulanate (AUGMENTIN) 875-125 MG tablet Take 1 tablet by mouth 2 (two) times daily. 06/03/16 06/10/16  Dron Tanna Furry, MD  clopidogrel (PLAVIX) 75 MG tablet Take 1 tablet (75 mg total) by mouth daily. Please don't take plavix until you have kyphoplasty. May resume after the procedure, please talk your PCP. 06/10/16   Dron Tanna Furry, MD  oxyCODONE-acetaminophen (PERCOCET/ROXICET) 5-325 MG tablet Take 1 tablet by mouth every 6 (six) hours as needed for moderate pain or severe pain. 06/03/16   Dron Tanna Furry, MD    Family History Family History  Problem Relation Age of Onset  . Cancer Mother     pancreatic  . Diabetes Mother   . Hyperlipidemia Mother   . Hypertension Mother   . Other Mother     varicose vein  . Cancer Father 72    throat  . Heart disease Father   . Heart attack Father   . Cancer Sister     breast  . Diabetes Sister   . Deep vein thrombosis Brother   . Diabetes Brother   . Hearing loss Brother   . Hyperlipidemia Brother   . Hypertension Brother   . Heart attack Brother   . Clotting disorder Brother   .  Diabetes Son   . Hyperlipidemia Son     Social History Social History  Substance Use Topics  . Smoking status: Former Smoker    Packs/day: 1.00    Years: 50.00    Types: Cigarettes    Quit date: 04/03/2016  . Smokeless tobacco: Never Used     Comment: up to 0.5 ppd 02/13/16  . Alcohol use Yes     Comment: occ     Allergies   Tape; Isosorbide; and Nicoderm [nicotine]   Review of Systems Review of Systems All other systems negative except as documented in the HPI. All pertinent positives and negatives as reviewed in the HPI.  Physical Exam Updated Vital Signs BP (!) 158/67 (BP Location: Left Arm) Comment: nurse notified  Pulse 82   Temp 97.7 F (36.5 C) (Oral)   Resp 17   Ht '5\' 5"'$  (1.651 m)   Wt 54.9 kg   LMP  (LMP Unknown)   SpO2 99%   BMI 20.14 kg/m   Physical Exam  Constitutional: She is oriented to person, place, and time. She appears well-developed and well-nourished. No distress.  HENT:  Head: Normocephalic and atraumatic.  Mouth/Throat: Oropharynx is clear and moist.  Eyes: Pupils are equal, round, and reactive to light.  Neck: Normal range of motion. Neck supple.  Cardiovascular: Normal rate, regular rhythm and normal heart sounds.  Exam reveals no gallop and no friction rub.   No murmur heard. Pulmonary/Chest: Effort normal. No respiratory distress. She has wheezes.  Abdominal: Soft. Bowel sounds are normal. She exhibits no distension. There is no tenderness.  Musculoskeletal: She exhibits no edema.  Neurological: She is alert and oriented to person, place, and time. She exhibits normal muscle tone. Coordination normal.  Skin: Skin is  warm and dry. No rash noted. No erythema.  Psychiatric: She has a normal mood and affect. Her behavior is normal.  Nursing note and vitals reviewed.    ED Treatments / Results  Labs (all labs ordered are listed, but only abnormal results are displayed) Labs Reviewed  CBC WITH DIFFERENTIAL/PLATELET - Abnormal; Notable  for the following:       Result Value   WBC 16.6 (*)    RDW 16.2 (*)    Neutro Abs 15.7 (*)    Lymphs Abs 0.5 (*)    All other components within normal limits  BASIC METABOLIC PANEL - Abnormal; Notable for the following:    Sodium 131 (*)    Potassium 5.4 (*)    Chloride 88 (*)    CO2 36 (*)    Glucose, Bld 485 (*)    All other components within normal limits  POTASSIUM - Abnormal; Notable for the following:    Potassium 5.2 (*)    All other components within normal limits  CBC - Abnormal; Notable for the following:    RBC 3.61 (*)    Hemoglobin 10.8 (*)    HCT 32.3 (*)    RDW 16.2 (*)    All other components within normal limits  BASIC METABOLIC PANEL - Abnormal; Notable for the following:    Sodium 134 (*)    Chloride 93 (*)    CO2 34 (*)    Glucose, Bld 197 (*)    BUN 22 (*)    Calcium 8.7 (*)    All other components within normal limits  GLUCOSE, CAPILLARY - Abnormal; Notable for the following:    Glucose-Capillary 148 (*)    All other components within normal limits  GLUCOSE, CAPILLARY - Abnormal; Notable for the following:    Glucose-Capillary 157 (*)    All other components within normal limits  GLUCOSE, CAPILLARY - Abnormal; Notable for the following:    Glucose-Capillary 265 (*)    All other components within normal limits  CBC - Abnormal; Notable for the following:    RBC 3.77 (*)    Hemoglobin 10.4 (*)    HCT 33.6 (*)    RDW 16.0 (*)    All other components within normal limits  GLUCOSE, CAPILLARY - Abnormal; Notable for the following:    Glucose-Capillary 387 (*)    All other components within normal limits  GLUCOSE, CAPILLARY - Abnormal; Notable for the following:    Glucose-Capillary 193 (*)    All other components within normal limits  GLUCOSE, CAPILLARY - Abnormal; Notable for the following:    Glucose-Capillary 230 (*)    All other components within normal limits  GLUCOSE, CAPILLARY - Abnormal; Notable for the following:    Glucose-Capillary  232 (*)    All other components within normal limits  GLUCOSE, CAPILLARY - Abnormal; Notable for the following:    Glucose-Capillary 374 (*)    All other components within normal limits  GLUCOSE, CAPILLARY - Abnormal; Notable for the following:    Glucose-Capillary 198 (*)    All other components within normal limits  GLUCOSE, CAPILLARY - Abnormal; Notable for the following:    Glucose-Capillary 207 (*)    All other components within normal limits  GLUCOSE, CAPILLARY - Abnormal; Notable for the following:    Glucose-Capillary 322 (*)    All other components within normal limits  CBC WITH DIFFERENTIAL/PLATELET - Abnormal; Notable for the following:    RBC 3.78 (*)    Hemoglobin 11.0 (*)  HCT 34.1 (*)    RDW 16.2 (*)    All other components within normal limits  GLUCOSE, CAPILLARY - Abnormal; Notable for the following:    Glucose-Capillary 309 (*)    All other components within normal limits  GLUCOSE, CAPILLARY - Abnormal; Notable for the following:    Glucose-Capillary 135 (*)    All other components within normal limits  GLUCOSE, CAPILLARY - Abnormal; Notable for the following:    Glucose-Capillary 222 (*)    All other components within normal limits  CBG MONITORING, ED - Abnormal; Notable for the following:    Glucose-Capillary 482 (*)    All other components within normal limits  TROPONIN I  HIV ANTIBODY (ROUTINE TESTING)  STREP PNEUMONIAE URINARY ANTIGEN  PROTIME-INR    EKG  EKG Interpretation  Date/Time:  Thursday May 30 2016 12:39:03 EST Ventricular Rate:  82 PR Interval:    QRS Duration: 81 QT Interval:  372 QTC Calculation: 435 R Axis:   39 Text Interpretation:  Sinus rhythm Borderline short PR interval since last tracing no significant change Confirmed by Eulis Foster  MD, Vira Agar ([id number]) on 05/30/2016 12:48:00 PM Also confirmed by Eulis Foster  MD, [place] [phone]), editor [id number], Leda Gauze [phone])  on 05/30/2016 2:52:51 PM       Radiology Dg Chest Port 1  View  Result Date: 06/06/2016 CLINICAL DATA:  Mid thoracic pain secondary fractures.  Dyspnea. EXAM: PORTABLE CHEST 1 VIEW COMPARISON:  CXR 05/30/2016, MRI 05/31/2016 of the thoracic spine FINDINGS: More confluent airspace opacity in the right mid lung about the right hilum. Mild bilateral chronic interstitial prominence is noted otherwise. Heart is normal in size. The aorta is atherosclerotic without aneurysm. No effusion or pneumothorax. IMPRESSION: Right perihilar consolidation possibly as a result of atelectasis. Pneumonia is not entirely excluded. Aortic atherosclerosis. Electronically Signed   By: Ashley Royalty M.D.   On: 06/06/2016 15:04    Procedures Procedures (including critical care time)  Medications Ordered in ED Medications  albuterol (PROVENTIL,VENTOLIN) solution continuous neb (0 mg/hr Nebulization Stopped 05/30/16 1520)  albuterol (PROVENTIL) (2.5 MG/3ML) 0.083% nebulizer solution 5 mg (5 mg Nebulization Given 05/30/16 1251)  oxyCODONE-acetaminophen (PERCOCET/ROXICET) 5-325 MG per tablet 1 tablet (1 tablet Oral Given 06/02/16 0817)  ipratropium (ATROVENT) nebulizer solution 0.5 mg (0.5 mg Nebulization Given 05/30/16 1423)  sodium chloride 0.9 % bolus 500 mL (500 mLs Intravenous New Bag/Given 05/30/16 1527)  oxyCODONE-acetaminophen (PERCOCET/ROXICET) 5-325 MG per tablet 1 tablet (1 tablet Oral Given 05/30/16 1526)  cefTRIAXone (ROCEPHIN) 1 g in dextrose 5 % 50 mL IVPB (1 g Intravenous New Bag/Given 05/30/16 1951)  predniSONE (DELTASONE) tablet 30 mg (30 mg Oral Given 05/31/16 0945)     Initial Impression / Assessment and Plan / ED Course  I have reviewed the triage vital signs and the nursing notes.  Pertinent labs & imaging results that were available during my care of the patient were reviewed by me and considered in my medical decision making (see chart for details).  Clinical Course    Patient be treated for possible pneumonia with her COPD and emphysema.  Return here as  needed.  Patient agrees the plan and all questions were answered  Final Clinical Impressions(s) / ED Diagnoses   Final diagnoses:  Closed compression fracture of thoracic vertebra, initial encounter ([place])  Aspiration pneumonia, unspecified aspiration pneumonia type, unspecified laterality, unspecified part of lung [facility])    New Prescriptions Discharge Medication List as of 06/03/2016  2:36 PM    START taking these medications  Details  amoxicillin-clavulanate (AUGMENTIN) 875-125 MG tablet Take 1 tablet by mouth 2 (two) times daily., Starting Mon 06/03/2016, Until Mon 06/10/2016, Print    oxyCODONE-acetaminophen (PERCOCET/ROXICET) 5-325 MG tablet Take 1 tablet by mouth every 6 (six) hours as needed for moderate pain or severe pain., Starting Mon 06/03/2016, Print         Dalia Heading, PA-C 06/06/16 1604    Daleen Bo, MD 06/06/16 2104

## 2016-06-06 NOTE — Progress Notes (Signed)
Pharmacy Antibiotic Note  Brenda [name] is a 66 y.o. female admitted on 06/06/2016 with pneumonia.  Pharmacy has been consulted for vanc dosing and cefepime ordered per Md  Plan: Vancomycin '500mg'$  IV every 12 hours.  Goal trough 15-20 mcg/mL.  Height: '5\' 5"'$  (165.1 cm) Weight: 117 lb 1 oz (53.1 kg) IBW/kg (Calculated) : 57  Temp (24hrs), Avg:99 F (37.2 C), Min:98.8 F (37.1 C), Max:99.1 F (37.3 C)   Recent Labs Lab 05/31/16 0550 06/01/16 0507 06/03/16 0543 06/06/16 1249  WBC 8.4 7.3 6.1 10.6*  CREATININE 0.65  --   --  0.74    Estimated Creatinine Clearance: 58.8 mL/min (by C-G formula based on SCr of 0.74 mg/dL).    Allergies  Allergen Reactions  . Tape Hives and Other (See Comments)    USE PAPER TAPE ONLY- Adhesive peels off skin-makes pt. Raw.  . Isosorbide Other (See Comments)    Drops BP too low  . Nicoderm [Nicotine] Rash    To the PATCH only, breakouts on skin     Thank you for allowing pharmacy to be a part of this patient's care.   Adrian Saran, PharmD, BCPS Pager [phone] 06/06/2016 6:52 PM

## 2016-06-06 NOTE — H&P (Signed)
[facility] History and Physical  GOLDYE [name] JOA:[id number] DOB: 1950-12-26 DOA: 06/06/2016  Referring physician: Dr Pascal Lux, direct admit PCP: Brenda Pepper, MD   Chief Complaint: recurrent pna  HPI: Brenda [name] is a 66 y.o. female significant past medical history of chronic obstructive pulmonary disease with chronic respiratory respiratory failure on home O2 at night, coronary artery disease status post PCI, tongue cancer status post radiation therapy with complaints of dysphagia and recurrent aspiration pneumonia, peripheral vascular disease, recently discharged from the hospital after a four-day admission on 06/03/2016 for which she was treated for aspiration pneumonia.  The patient was scheduled for kyphoplasty for vertebral fractures today and presented to IR today for kyphoplasty. However, she was noted to be short of breath with oxygen sats noted in low 90s.  She was done which showed progression of her pneumonia. Dr. Pascal Lux requested. The patient be admitted for pneumonia. The patient off Plavix for now for possible kyphoplasty early next week if patient is medically improved.    She was directly admitted to a telemetry bed. On evaluation of patient, she denies any acute distress. She says since discharge, she feels about the same. She's been taking all her medications. She denies any fevers or chills. Does admit to recurrent aspiration pneumonia, ongoing on and off since November.  She does not tolerate her current diet very well; she  is on a chopped diet with thickened liquids. The food feels very dry, she has a difficulty swallowing.  She says she has more sputum production with thickened liquids than thin liquids.  Her main issue right now is back pain for which she would like to have a kyphoplasty significant   Review of Systems:  Per history of present illness or otherwise all systems reviewed, essentially unremarkable except for all above.  Past Medical History:    Diagnosis Date  . Arthritis    "hands" (11/26/2012)  . CAD S/P percutaneous coronary angioplasty 1999; 2001;2003, 10/2014   a) BMS- mCx; b) re-do PCI for prog of Dz (NIR BMS 3.5 x 12); c) staged PCI: p-mRCA Express II BMS 3.0x24 & 3.5 x 16 --> p-mLAD Cyper DES 3.0 x 13 & PTCA of D2 (2.5 balloon); 10/2015 PCI p-m LAD Promus DES 2.75 x 38 (covers pre&post-stent ISR), POBA of ostial rPDA  . Cancer of base of tongue ([place]) 2010   "& lymph nodes @ right neck" (11/26/2012  . Carotid artery occlusion   . Chronic bronchitis ([place])   . COPD (chronic obstructive pulmonary disease) with emphysema ([place])   . Depression   . GERD (gastroesophageal reflux disease)   . H/O hiatal hernia   . History of blood transfusion   . History of DVT (deep vein thrombosis)   . Hx of radiation therapy   . Hx of radiation therapy 01/16/09 - 03/06/09   base of tongue, right neck node  . Hypercholesteremia   . Myocardial infarction 1999, 2003  . PAD (peripheral artery disease) ([name])    status post bilateral femoropopliteal bypass grafting --> Dr. Sherren Mocha Early; -  02/2013 Revision of R Fem-AKPop to BK Pop.  . Patent foramen ovale February 2013   Small. Small PFO noted on TTE for TIA/CVA R-L + Bubble study with valsalva  . Pneumonia   . Stroke [facility])    left side weakness remains (11/26/2012)  . Transient ischemic attack (TIA) February 2013  . Type II diabetes mellitus ([place])    Past Surgical History:  Procedure Laterality Date  . ANKLE  FRACTURE SURGERY Right 2007   "got a metal plate and 8 screws in it" (11/26/2012)  . APPENDECTOMY  1962  . BYPASS GRAFT POPLITEAL TO POPLITEAL Right 02/05/2013   Procedure: BYPASS GRAFT ABOVE KNEE POPLITEAL TO BELOW KNEE POPLITEAL WITH SMALL SAPHANOUS VEIN;  Surgeon: Rosetta Posner, MD;  Location: [place];  Service: Vascular;  Laterality: Right;  . CARDIAC CATHETERIZATION  1999; 10/2005   5/'07: mildLAD 20-30%, patent stent, diags- no Dz. RCA 2 BMS patent ~20% ISR.   Marland Kitchen CARDIAC CATHETERIZATION N/A  10/06/2015   Procedure: Left Heart Cath and Coronary Angiography;  Surgeon: Leonie Man, MD;  Location: [place] CV [facility];  Service: Cardiovascular; p-mLAD 99% pre-stent, 60% ISR & 80% post-stent, o-RPDA 90%, ~10% ISR in 2 RCA (Prox & mid) BMS,~20% ISR in pCx BMS with 5% OM2 BMS.    Marland Kitchen CARDIAC CATHETERIZATION N/A 10/06/2015   Procedure: Coronary Stent Intervention;  Surgeon: Leonie Man, MD;  Location: [place] [facility];  Service: Cardiovascular; p-m LAD Promus DES 2.75 x 38 (tapered post-dilatoin)  . CARDIAC CATHETERIZATION N/A 10/06/2015   Procedure: Coronary Balloon Angioplasty;  Surgeon: Leonie Man, MD;  Location: [place] [facility];  Service: Cardiovascular;  POBA of Ostial rPDA.  Marland Kitchen CARPAL TUNNEL RELEASE Right 1980's?  Marland Kitchen CATARACT EXTRACTION W/ INTRAOCULAR LENS IMPLANT Right 2011  . [place]  . CORONARY ANGIOPLASTY WITH STENT PLACEMENT  1999--2005   1999 - OM2 BMS; 2001: p-mCx NIR BMS 3.5 x 12; 10/2001: p-mRCA Express II BMS - 3.5 x 16 - 3.0 x 24.   . ESOPHAGOGASTRODUODENOSCOPY  12/20/2011   Procedure: ESOPHAGOGASTRODUODENOSCOPY (EGD);  Surgeon: Beryle Beams, MD;  Location: [facility];  Service: Endoscopy;  Laterality: N/A;  EGD with balloon dilation  . EYE SURGERY    . FEMORAL BYPASS  1999; 2000   Bilateral Fem-AK POP bypass.   Marland Kitchen PERIPHERAL VASCULAR CATHETERIZATION N/A 12/01/2015   Procedure: Abdominal Aortogram w/Lower Extremity;  Surgeon: Elam Dutch, MD;  Location: [place] [facility];  Service: Cardiovascular;  Laterality: N/A;  . PERIPHERAL VASCULAR CATHETERIZATION Right 12/22/2015   Procedure: Peripheral Vascular Intervention;  Surgeon: Rosetta Posner, MD;  Location: [place] [facility];  Service: Cardiovascular;  Laterality: Right;  EXternal iliac  . SAVORY DILATION  12/20/2011   Procedure: SAVORY DILATION;  Surgeon: Beryle Beams, MD;  Location: [facility];  Service: Endoscopy;  Laterality: N/A;  . TEE WITHOUT CARDIOVERSION  07/26/2011   Procedure:  TRANSESOPHAGEAL ECHOCARDIOGRAM (TEE);  Surgeon: Pixie Casino, MD;  Location: [facility] END[facility];  Service: Cardiovascular;  Laterality: N/A;  . TONSILLECTOMY  1969  . TRANSTHORACIC ECHOCARDIOGRAM  March 2016   Normal LV size and function EF 55-60%. No regional wall motion and amount is. No significant valvular lesions.  . TUBAL LIGATION  1988  . VAGINAL HYSTERECTOMY  1988   Social History:  reports that she quit smoking about 2 months ago. Her smoking use included Cigarettes. She has a 50.00 pack-year smoking history. She has never used smokeless tobacco. She reports that she drinks alcohol. She reports that she does not use drugs.  Allergies  Allergen Reactions  . Tape Hives and Other (See Comments)    USE PAPER TAPE ONLY- Adhesive peels off skin-makes pt. Raw.  . Isosorbide Other (See Comments)    Drops BP too low  . Nicoderm [Nicotine] Rash    To the PATCH only, breakouts on skin    Family History  Problem Relation Age  of Onset  . Cancer Mother     pancreatic  . Diabetes Mother   . Hyperlipidemia Mother   . Hypertension Mother   . Other Mother     varicose vein  . Cancer Father 72    throat  . Heart disease Father   . Heart attack Father   . Cancer Sister     breast  . Diabetes Sister   . Deep vein thrombosis Brother   . Diabetes Brother   . Hearing loss Brother   . Hyperlipidemia Brother   . Hypertension Brother   . Heart attack Brother   . Clotting disorder Brother   . Diabetes Son   . Hyperlipidemia Son     Prior to Admission medications   Medication Sig Start Date End Date Taking? Authorizing Provider  acetaminophen (TYLENOL) 325 MG tablet Take 2 tablets (650 mg total) by mouth every 6 (six) hours as needed. Patient taking differently: Take 650 mg by mouth every 6 (six) hours as needed for mild pain.  05/01/16  Yes [name], PA-C  amoxicillin-clavulanate (AUGMENTIN) 875-125 MG tablet Take 1 tablet by mouth 2 (two) times daily. 06/03/16 06/10/16 Yes Dron Tanna Furry, MD  bisoprolol (ZEBETA) 5 MG tablet Take 0.5 tablets (2.5 mg total) by mouth daily. 05/15/16  Yes Orson Eva, MD  buPROPion (WELLBUTRIN XL) 300 MG 24 hr tablet Take 300 mg by mouth daily.  09/19/15  Yes Historical Provider, MD  cyclobenzaprine (FLEXERIL) 5 MG tablet Take 1-2 tablets (5-10 mg total) by mouth 3 (three) times daily as needed for muscle spasms. 05/09/16  Yes Naiping Ephriam Jenkins, MD  feeding supplement, GLUCERNA SHAKE, (GLUCERNA SHAKE) LIQD Take 237 mLs by mouth 2 (two) times daily between meals. 04/09/16  Yes Donita Brooks, NP  fish oil-omega-3 fatty acids 1000 MG capsule Take 1 g by mouth 2 (two) times daily.    Yes Historical Provider, MD  fluticasone (FLONASE) 50 MCG/ACT nasal spray Place 2 sprays into both nostrils daily. 06/02/15  Yes Debbe Odea, MD  fluticasone furoate-vilanterol (BREO ELLIPTA) 100-25 MCG/INH AEPB Inhale 1 puff into the lungs daily. 09/13/15  Yes Tammy S Parrett, NP  guaiFENesin (MUCINEX) 600 MG 12 hr tablet Take 1,200 mg by mouth 2 (two) times daily.   Yes Historical Provider, MD  ibuprofen (ADVIL,MOTRIN) 200 MG tablet Take 400 mg by mouth every 6 (six) hours as needed (Pain).   Yes Historical Provider, MD  insulin glargine (LANTUS) 100 UNIT/ML injection Inject 0.1 mLs (10 Units total) into the skin at bedtime. 05/14/16  Yes Orson Eva, MD  insulin lispro (HUMALOG) 100 UNIT/ML injection Inject 3 Units into the skin 3 (three) times daily with meals as needed for high blood sugar.    Yes Historical Provider, MD  ipratropium-albuterol (DUONEB) 0.5-2.5 (3) MG/3ML SOLN Take 3 mLs by nebulization every 6 (six) hours. Patient taking differently: Take 3 mLs by nebulization 2 (two) times daily as needed (for wheezingt or shortness of breath).  06/02/15  Yes Debbe Odea, MD  loratadine (CLARITIN) 10 MG tablet Take 1 tablet (10 mg total) by mouth daily. 06/02/15  Yes Debbe Odea, MD  Maltodextrin-Xanthan Gum ([place]) POWD Use with thin liquids to make  nectar thickened consistency 05/14/16  Yes Orson Eva, MD  nitroGLYCERIN (NITROLINGUAL) 0.4 MG/SPRAY spray Place 1 spray under the tongue every 5 (five) minutes as needed for chest pain. 01/20/15  Yes Belkys A Regalado, MD  omeprazole (PRILOSEC) 20 MG capsule Take 20 mg by mouth  daily.    Yes Historical Provider, MD  oxyCODONE-acetaminophen (PERCOCET/ROXICET) 5-325 MG tablet Take 1 tablet by mouth every 6 (six) hours as needed for moderate pain or severe pain. 06/03/16  Yes Dron Tanna Furry, MD  polyethylene glycol powder (GLYCOLAX/MIRALAX) powder Take 17 g by mouth 2 (two) times daily. 05/01/16  Yes [name], PA-C  predniSONE (DELTASONE) 10 MG tablet Take 4 tabs  daily with food x 4 days, then 3 tabs daily x 4 days, then 2 tabs daily x 4 days, then 1 tab daily x4 days then stop. #40 05/24/16  Yes Erick Colace, NP  rosuvastatin (CRESTOR) 20 MG tablet Take 20 mg by mouth daily.   Yes Historical Provider, MD  SPIRIVA RESPIMAT 2.5 MCG/ACT AERS 2 puffs daily. 10/24/15  Yes Historical Provider, MD  traMADol (ULTRAM) 50 MG tablet Take 1 tablet (50 mg total) by mouth every 6 (six) hours as needed for moderate pain or severe pain. 05/09/16  Yes Naiping Ephriam Jenkins, MD  albuterol (PROVENTIL HFA;VENTOLIN HFA) 108 (90 BASE) MCG/ACT inhaler Inhale 2 puffs into the lungs every 6 (six) hours as needed for wheezing or shortness of breath. 01/20/15   Belkys A Regalado, MD  aspirin EC 81 MG tablet Take 1 tablet (81 mg total) by mouth daily. 10/07/15   Thurnell Lose, MD  clopidogrel (PLAVIX) 75 MG tablet Take 1 tablet (75 mg total) by mouth daily. Please don't take plavix until you have kyphoplasty. May resume after the procedure, please talk your PCP. 06/10/16   Dron Tanna Furry, MD   Physical Exam: Vitals:   06/06/16 1240 06/06/16 1620 06/06/16 1822  BP: (!) 117/53 (!) 140/57 122/76  Pulse: 86 76 79  Resp: '16 17 16  '$ Temp: 99.1 F (37.3 C) 98.8 F (37.1 C) 99 F (37.2 C)  TempSrc: Oral Oral Oral  SpO2: 90%  100% 100%  Weight:   53.1 kg (117 lb 1 oz)  Height:   '5\' 5"'$  (1.651 m)    Wt Readings from Last 3 Encounters:  06/06/16 53.1 kg (117 lb 1 oz)  05/30/16 54.9 kg (121 lb 0.5 oz)  05/24/16 60.5 kg (133 lb 6.1 oz)    General:  Appears calm and comfortable, pleasant, NAD, AAOx3, on nasal cannula, cachectic and frail appearing Eyes: PERRL, normal lids, irises & conjunctiva ENT: grossly normal hearing, lips & tongue, dry mmm. Neck:  no jvd. Cardiovascular: RRR, no m/r/g. No LE edema. Respiratory: CTA bilaterally, diminished at bases, no w/r. Normal respiratory effort. Abdomen: soft, ntnd Skin: no rash or induration seen on limited exam, echymosis, old/healing noted upper extrem from likely prior IV lines. Musculoskeletal: grossly normal tone BUE/BLE Psychiatric: grossly normal mood and affect, speech fluent and appropriate Neurologic: grossly non-focal.          Labs on Admission:  Basic Metabolic Panel:  Recent Labs Lab 05/31/16 0550 06/06/16 1249  NA 134* 128*  K 4.6 4.3  CL 93* 91*  CO2 34* 30  GLUCOSE 197* 279*  BUN 22* 18  CREATININE 0.65 0.74  CALCIUM 8.7* 8.7*   Liver Function Tests: No results for input(s): AST, ALT, ALKPHOS, BILITOT, PROT, ALBUMIN in the last 168 hours. No results for input(s): LIPASE, AMYLASE in the last 168 hours. No results for input(s): AMMONIA in the last 168 hours. CBC:  Recent Labs Lab 05/31/16 0550 06/01/16 0507 06/03/16 0543 06/06/16 1249  WBC 8.4 7.3 6.1 10.6*  NEUTROABS  --   --  4.5 9.4*  HGB 10.8* 10.4*  11.0* 10.9*  HCT 32.3* 33.6* 34.1* 33.7*  MCV 89.5 89.1 90.2 88.5  PLT 243 245 230 209   Cardiac Enzymes: No results for input(s): CKTOTAL, CKMB, CKMBINDEX, TROPONINI in the last 168 hours.  BNP (last 3 results)  Recent Labs  05/09/16 2102  BNP 175.5*    ProBNP (last 3 results) No results for input(s): PROBNP in the last 8760 hours.  CBG:  Recent Labs Lab 06/02/16 2022 06/03/16 0730 06/03/16 1150  06/06/16 1251 06/06/16 1653  GLUCAP 309* 135* 222* 284* 235*    Radiological Exams on Admission: Dg Chest Port 1 View  Result Date: 06/06/2016 CLINICAL DATA:  Mid thoracic pain secondary fractures.  Dyspnea. EXAM: PORTABLE CHEST 1 VIEW COMPARISON:  CXR 05/30/2016, MRI 05/31/2016 of the thoracic spine FINDINGS: More confluent airspace opacity in the right mid lung about the right hilum. Mild bilateral chronic interstitial prominence is noted otherwise. Heart is normal in size. The aorta is atherosclerotic without aneurysm. No effusion or pneumothorax. IMPRESSION: Right perihilar consolidation possibly as a result of atelectasis. Pneumonia is not entirely excluded. Aortic atherosclerosis. Electronically Signed   By: Ashley Royalty M.D.   On: 06/06/2016 15:04    EKG: none  Assessment/Plan Principal Problem:   Recurrent aspiration pneumonia ([name]) Active Problems:   Closed compression fracture of thoracic vertebra ([name])   Acute on chronic respiratory failure with hypoxemia ([name])   DM (diabetes mellitus), type 2, uncontrolled ([place])   Cancer of base of tongue- radiation 2010   COPD (chronic obstructive pulmonary disease) ([place])   Dysphagia with aspiration   1. Recurrent aspiration pneumonia - Patient initially planned for kyphoplasty, however, complained of shortness of breath, found to have low sats in radiology, chest x-ray notes right perihilar consolidation possibly reflecting atelectasis, but pneumonia could not be ruled out. - Direct admit to telemetry - Gentle hydration, pan culture - Doppler precautions - Check urine Legionella. Urine strep, sputum cx, influenza panel as well - Empiric cefepime and vancomycin, pharmacy to assist w/ vancomycin dosing - deescalate abx as able. - wean o2 as able to baseline.  2. Acute on chronic respiratory failure with hypoxemia Empiric antibiotics as mentioned above, wean O2 to baseline, as able  3. Closed compression fracture of thoracic spine -  Pending kyphoplasty.  - Dr. Pascal Lux asked [place] to hold the Plavix for now for possible kyphoplasty prior to discharge if she is medically improved.  4. Severe COPD, is stable at this time,  - does not appear to be in acute exacerbation, no wheezing noted, will continue her home steroid regimen taper, continue nebs when necessary, continue her Spiriva - patient's Pulm is Dr Lake Bells, from his recent H&P on 05/20/16, she may be approaching end-stage disease. - consider c/s Pulm if pt deteriorates.  5. Diabetes - We'll hold her Lantus tonight.  - Renew tomorrow if her by mouth intake is better. Regular insulin sliding scale for now  6. Tongue cancer with history of dysphagia - We'll do pured thickened liquids diet for now. Last speech therapy to evaluate her in the morning for further recommendations - aspiration precautions  7. Hx of CAD w/ pci - hold plavix for now pending kyphoplasty, continue statin.    C/s:- consider c/s Pulm if pt deteriorates. Pt's pulm is Dr [place] Bells (who has not yet been notified)  Code Status: full DVT Prophylaxis: hep [place] tid Family Communication:  No family at bedside Disposition Plan: inpt tele  Time spent: 70mns  DMaren ReamerMD., MBA/MHA [facility]  Pager [phone]

## 2016-06-06 NOTE — Progress Notes (Signed)
Stroke Swallow Screen:  Called by primary bedside RN to conduct a stroke swallow screen.  Pt is not currently being seen for stroke.  Stroke Swallow Screens are only to be done on patients currently being seen for stroke.  A Stroke Swallow Screen is not valid for any other patients, therefore cannot be performed.  Also patient has a Hx of Dysphagia, which is the first pre-swallow evaluation assessment question, which would disqualify this patient, if they were having a stroke.  Further, A speech therapy swallow evaluation was already ordered on admission.  If the patient was having a stroke and failed a stroke swallow evaluation, per Rapid Response Protocol a speech therapy swallow evaluation would of been ordered.  Due to a speech therapy evaluation already being ordered, the order for a [place] to be preformed is null and void.  Nance Pew [facility] ICU/SD Care Coordinator / Rapid Response RN

## 2016-06-06 NOTE — Progress Notes (Signed)
Report given to Lonni Fix RN by me and patient was transferred to Rm #[id number] via stretcher.

## 2016-06-07 DIAGNOSIS — J9621 Acute and chronic respiratory failure with hypoxia: Secondary | ICD-10-CM

## 2016-06-07 DIAGNOSIS — S22000A Wedge compression fracture of unspecified thoracic vertebra, initial encounter for closed fracture: Secondary | ICD-10-CM

## 2016-06-07 DIAGNOSIS — J690 Pneumonitis due to inhalation of food and vomit: Principal | ICD-10-CM

## 2016-06-07 DIAGNOSIS — M4854XA Collapsed vertebra, not elsewhere classified, thoracic region, initial encounter for fracture: Secondary | ICD-10-CM

## 2016-06-07 DIAGNOSIS — R1310 Dysphagia, unspecified: Secondary | ICD-10-CM

## 2016-06-07 LAB — COMPREHENSIVE METABOLIC PANEL
ALK PHOS: 149 U/L — AB (ref 38–126)
ALT: 15 U/L (ref 14–54)
AST: 15 U/L (ref 15–41)
Albumin: 2.8 g/dL — ABNORMAL LOW (ref 3.5–5.0)
Anion gap: 7 (ref 5–15)
BUN: 13 mg/dL (ref 6–20)
CALCIUM: 8.7 mg/dL — AB (ref 8.9–10.3)
CO2: 32 mmol/L (ref 22–32)
CREATININE: 0.68 mg/dL (ref 0.44–1.00)
Chloride: 97 mmol/L — ABNORMAL LOW (ref 101–111)
Glucose, Bld: 120 mg/dL — ABNORMAL HIGH (ref 65–99)
Potassium: 4.1 mmol/L (ref 3.5–5.1)
Sodium: 136 mmol/L (ref 135–145)
Total Bilirubin: 0.7 mg/dL (ref 0.3–1.2)
Total Protein: 5.7 g/dL — ABNORMAL LOW (ref 6.5–8.1)

## 2016-06-07 LAB — CBC
HCT: 34.1 % — ABNORMAL LOW (ref 36.0–46.0)
Hemoglobin: 11.1 g/dL — ABNORMAL LOW (ref 12.0–15.0)
MCH: 29.6 pg (ref 26.0–34.0)
MCHC: 32.6 g/dL (ref 30.0–36.0)
MCV: 90.9 fL (ref 78.0–100.0)
PLATELETS: 239 10*3/uL (ref 150–400)
RBC: 3.75 MIL/uL — AB (ref 3.87–5.11)
RDW: 16.5 % — AB (ref 11.5–15.5)
WBC: 7.7 10*3/uL (ref 4.0–10.5)

## 2016-06-07 LAB — GLUCOSE, CAPILLARY: Glucose-Capillary: 116 mg/dL — ABNORMAL HIGH (ref 65–99)

## 2016-06-07 LAB — HIV ANTIBODY (ROUTINE TESTING W REFLEX): HIV SCREEN 4TH GENERATION: NONREACTIVE

## 2016-06-07 LAB — STREP PNEUMONIAE URINARY ANTIGEN: Strep Pneumo Urinary Antigen: NEGATIVE

## 2016-06-07 MED ORDER — SODIUM CHLORIDE 0.9 % IV SOLN
1.5000 g | Freq: Four times a day (QID) | INTRAVENOUS | Status: DC
  Administered 2016-06-07 – 2016-06-09 (×8): 1.5 g via INTRAVENOUS
  Filled 2016-06-07 (×9): qty 1.5

## 2016-06-07 MED ORDER — GLUCERNA SHAKE PO LIQD
237.0000 mL | Freq: Three times a day (TID) | ORAL | Status: DC
  Administered 2016-06-07 – 2016-06-11 (×7): 237 mL via ORAL
  Filled 2016-06-07 (×14): qty 237

## 2016-06-07 MED ORDER — SODIUM CHLORIDE 0.9 % IV SOLN: 1.5000 g | Freq: Three times a day (TID) | INTRAVENOUS | Status: DC

## 2016-06-07 MED ORDER — KETOROLAC TROMETHAMINE 15 MG/ML IJ SOLN
15.0000 mg | Freq: Four times a day (QID) | INTRAMUSCULAR | Status: DC
  Administered 2016-06-07 – 2016-06-11 (×16): 15 mg via INTRAVENOUS
  Filled 2016-06-07 (×16): qty 1

## 2016-06-07 MED ORDER — CALCITONIN (SALMON) 200 UNIT/ACT NA SOLN
1.0000 | Freq: Every day | NASAL | Status: DC
  Administered 2016-06-07 – 2016-06-11 (×5): 1 via NASAL
  Filled 2016-06-07 (×2): qty 3.7

## 2016-06-07 NOTE — Progress Notes (Signed)
Palliative Medicine consult noted. Due to high referral volume, there may be a delay seeing this patient. Please call the Palliative Medicine Team office at [phone] if recommendations are needed in the interim.  Thank you for inviting [place] to see this patient.  Marjie Skiff Balen Woolum, RN, BSN, [facility] 06/07/2016 8:53 AM Cell [phone] 8:00-4:00 Monday-Friday Office [phone]

## 2016-06-07 NOTE — Progress Notes (Signed)
PHARMACY NOTE:  ANTIMICROBIAL RENAL DOSAGE ADJUSTMENT  Current antimicrobial regimen includes a mismatch between antimicrobial dosage and estimated renal function.  As per policy approved by the Pharmacy & Therapeutics and Medical Executive Committees, the antimicrobial dosage will be adjusted accordingly.  Current antimicrobial dosage:  Unasyn 1.5 gr IV q8h  Indication: aspiration PNA  Renal Function:  Estimated Creatinine Clearance: 58.8 mL/min (by C-G formula based on SCr of 0.68 mg/dL). '[]'$      On intermittent HD, scheduled: '[]'$      On CRRT    Antimicrobial dosage has been changed to:  Unasyn 1.5 gr IV q6h  Additional comments:   Thank you for allowing pharmacy to be a part of this patient's care.   Royetta Asal, PharmD, BCPS Pager [phone] 06/07/2016 12:07 PM

## 2016-06-07 NOTE — Evaluation (Signed)
Clinical/Bedside Swallow Evaluation Patient Details  Name: Brenda [name] MRN: [id number] Date of Birth: 1950/10/25  Today's Date: 06/07/2016 Time: SLP Start Time (ACUTE ONLY): 1008 SLP Stop Time (ACUTE ONLY): 1100 SLP Time Calculation (min) (ACUTE ONLY): 52 min  Past Medical History:  Past Medical History:  Diagnosis Date  . Arthritis    "hands" (11/26/2012)  . CAD S/P percutaneous coronary angioplasty 1999; 2001;2003, 10/2014   a) BMS- mCx; b) re-do PCI for prog of Dz (NIR BMS 3.5 x 12); c) staged PCI: p-mRCA Express II BMS 3.0x24 & 3.5 x 16 --> p-mLAD Cyper DES 3.0 x 13 & PTCA of D2 (2.5 balloon); 10/2015 PCI p-m LAD Promus DES 2.75 x 38 (covers pre&post-stent ISR), POBA of ostial rPDA  . Cancer of base of tongue ([place]) 2010   "& lymph nodes @ right neck" (11/26/2012  . Carotid artery occlusion   . Chronic bronchitis ([place])   . COPD (chronic obstructive pulmonary disease) with emphysema ([place])   . Depression   . GERD (gastroesophageal reflux disease)   . H/O hiatal hernia   . History of blood transfusion   . History of DVT (deep vein thrombosis)   . Hx of radiation therapy   . Hx of radiation therapy 01/16/09 - 03/06/09   base of tongue, right neck node  . Hypercholesteremia   . Myocardial infarction 1999, 2003  . PAD (peripheral artery disease) ([name])    status post bilateral femoropopliteal bypass grafting --> Dr. Sherren Mocha Early; -  02/2013 Revision of R Fem-AKPop to BK Pop.  . Patent foramen ovale February 2013   Small. Small PFO noted on TTE for TIA/CVA R-L + Bubble study with valsalva  . Pneumonia   . Stroke [facility])    left side weakness remains (11/26/2012)  . Transient ischemic attack (TIA) February 2013  . Type II diabetes mellitus ([place])    Past Surgical History:  Past Surgical History:  Procedure Laterality Date  . ANKLE FRACTURE SURGERY Right 2007   "got a metal plate and 8 screws in it" (11/26/2012)  . APPENDECTOMY  1962  . BYPASS GRAFT POPLITEAL TO POPLITEAL Right 02/05/2013    Procedure: BYPASS GRAFT ABOVE KNEE POPLITEAL TO BELOW KNEE POPLITEAL WITH SMALL SAPHANOUS VEIN;  Surgeon: Rosetta Posner, MD;  Location: [place];  Service: Vascular;  Laterality: Right;  . CARDIAC CATHETERIZATION  1999; 10/2005   5/'07: mildLAD 20-30%, patent stent, diags- no Dz. RCA 2 BMS patent ~20% ISR.   Marland Kitchen CARDIAC CATHETERIZATION N/A 10/06/2015   Procedure: Left Heart Cath and Coronary Angiography;  Surgeon: Leonie Man, MD;  Location: [place] [facility];  Service: Cardiovascular; p-mLAD 99% pre-stent, 60% ISR & 80% post-stent, o-RPDA 90%, ~10% ISR in 2 RCA (Prox & mid) BMS,~20% ISR in pCx BMS with 5% OM2 BMS.    Marland Kitchen CARDIAC CATHETERIZATION N/A 10/06/2015   Procedure: Coronary Stent Intervention;  Surgeon: Leonie Man, MD;  Location: [place] [facility];  Service: Cardiovascular; p-m LAD Promus DES 2.75 x 38 (tapered post-dilatoin)  . CARDIAC CATHETERIZATION N/A 10/06/2015   Procedure: Coronary Balloon Angioplasty;  Surgeon: Leonie Man, MD;  Location: [place] [facility];  Service: Cardiovascular;  POBA of Ostial rPDA.  Marland Kitchen CARPAL TUNNEL RELEASE Right 1980's?  Marland Kitchen CATARACT EXTRACTION W/ INTRAOCULAR LENS IMPLANT Right 2011  . Rafael Hernandez  . CORONARY ANGIOPLASTY WITH STENT PLACEMENT  1999--2005   1999 - OM2 BMS; 2001: p-mCx NIR BMS 3.5 x 12; 10/2001: p-mRCA Express II  BMS - 3.5 x 16 - 3.0 x 24.   . ESOPHAGOGASTRODUODENOSCOPY  12/20/2011   Procedure: ESOPHAGOGASTRODUODENOSCOPY (EGD);  Surgeon: Beryle Beams, MD;  Location: [facility];  Service: Endoscopy;  Laterality: N/A;  EGD with balloon dilation  . EYE SURGERY    . FEMORAL BYPASS  1999; 2000   Bilateral Fem-AK POP bypass.   Marland Kitchen PERIPHERAL VASCULAR CATHETERIZATION N/A 12/01/2015   Procedure: Abdominal Aortogram w/Lower Extremity;  Surgeon: Elam Dutch, MD;  Location: [place] [facility];  Service: Cardiovascular;  Laterality: N/A;  . PERIPHERAL VASCULAR CATHETERIZATION Right 12/22/2015   Procedure: Peripheral Vascular Intervention;   Surgeon: Rosetta Posner, MD;  Location: [place] [facility];  Service: Cardiovascular;  Laterality: Right;  EXternal iliac  . SAVORY DILATION  12/20/2011   Procedure: SAVORY DILATION;  Surgeon: Beryle Beams, MD;  Location: [facility];  Service: Endoscopy;  Laterality: N/A;  . TEE WITHOUT CARDIOVERSION  07/26/2011   Procedure: TRANSESOPHAGEAL ECHOCARDIOGRAM (TEE);  Surgeon: Pixie Casino, MD;  Location: [facility];  Service: Cardiovascular;  Laterality: N/A;  . TONSILLECTOMY  1969  . TRANSTHORACIC ECHOCARDIOGRAM  March 2016   Normal LV size and function EF 55-60%. No regional wall motion and amount is. No significant valvular lesions.  . TUBAL LIGATION  1988  . VAGINAL HYSTERECTOMY  1988   HPI:  66 y.o. female with medical history significant of COPD, CAD with stents, dysphagia with recurrent aspiration pneumonia.  She had been doing well with regards to dysphagia on a Dys 3 diet and nectar thick liquids (other than her AM coffee).  Until she "slacked off" on this in October 2017 and stopped using thicker.  Since then she has been admitted 3 previous times (today makes #5) for recurrent aspiration pneumonia of the RLL also with COPD exacerbation.  Pt reports most of her problems in esophagus not her throat per her ENT and GI MD.  Pt has undergone MBS previously and reports she drinks regular/thin coffee in am.   Pt reports back pain severe enough that she can't sit up enough to breathe or cough adequately.     Assessment / Plan / Recommendation Clinical Impression  Pt presents with signs/symptoms of multiple factorial dysphagia due to h/o tongue cancer s/p XRT in 2010 and known presbyesophagus diagnosed 12/17.  Pt currently has extreme pain with coughing due to her spinal issues which decreases her ability to clear aspirates.    Xerostomia also large contributing factor.   Multiple swallows with all consistencies and pt report of residuals in pharynx with solids/puree.  Cough and expectoration  of viscous secretions mixed with cracker residual noted and pt stating "now I'm choking".    Overt indication of aspiration with thin water present c/b overt coughing/congestion immediately post=swallow.  Pt reports she does not drink adequately when in the hospital due to thickened liquids.  Reviewed SLP role to mitigate aspiration but not functionally prevent it.   Pt admits to liquids clearing better than solids.  Reviewed MBS and prior esophagram with pt as well as precautions needed.  Recommend full liquids/nectar thick and allow thin water between meals as compromise and respect for pt's wishes.    Suspect pt has some chronic aspiration due to her multifactorial dysphagia.  Will follow up Monday for assess tolerance, readiness for advancement.  Hopefully as pt's ability to cough with less pain, her aspiration tolerance will be improved.     Aspiration Risk  Moderate aspiration risk    Diet Recommendation  Ice chips PRN after oral care;Nectar-thick liquid;Free water protocol after oral care (pt likes Vanilla Glucerna)   Medication Administration: Crushed with puree Supervision: Patient able to self feed Compensations: Slow rate;Small sips/bites;Multiple dry swallows after each bite/sip;Clear throat intermittently;Other (Comment) (cough/expectorate prn)    Other  Recommendations Oral Care Recommendations: Oral care prior to ice chip/H20;Oral care BID Other Recommendations: Prohibited food (jello, ice cream, thin soups);Order thickener from pharmacy;Remove water pitcher   Follow up Recommendations Other (comment) (TBD)      Frequency and Duration min 1 x/week  1 week       Prognosis Prognosis for Safe Diet Advancement: Fair Barriers to Reach Goals: Severity of deficits      Swallow Study   General Date of Onset: 05/30/16 HPI: 67 y.o. female with medical history significant of COPD, CAD with stents, dysphagia with recurrent aspiration pneumonia.  She had been doing well with  regards to dysphagia on a Dys 3 diet and nectar thick liquids (other than her AM coffee).  Until she "slacked off" on this in October 2017 and stopped using thicker.  Since then she has been admitted 3 previous times (today makes #5) for recurrent aspiration pneumonia of the RLL also with COPD exacerbation.  Pt reports most of her problems in esophagus not her throat per her ENT and GI MD.  Pt has undergone MBS previously and reports she drinks regular/thin coffee in am.   Pt reports back pain severe enough that she can't sit up enough to breathe or cough adequately.   Type of Study: Bedside Swallow Evaluation Previous Swallow Assessment:  (MBS 07/10/15 recommended Dys 3/nectar-thick liquids) Temperature Spikes Noted: Yes Respiratory Status: Room air History of Recent Intubation: No Behavior/Cognition: Alert;Cooperative;Distractible;Other (Comment) (pain is a 7 in back) Oral Care Completed by SLP: No Oral Cavity - Dentition: Dentures, top;Dentures, bottom Vision: Functional for self-feeding Self-Feeding Abilities: Able to feed self Patient Positioning: Upright in bed Baseline Vocal Quality: Hoarse (voice is not wet) Volitional Cough: Congested (not productive) Volitional Swallow: Able to elicit    Oral/Motor/Sensory Function Overall Oral Motor/Sensory Function: Generalized oral weakness   Ice Chips Ice chips: Impaired Presentation: Spoon Pharyngeal Phase Impairments: Suspected delayed Swallow;Multiple swallows;Throat Clearing - Delayed   Thin Liquid Thin Liquid: Impaired Presentation: Cup;Self Fed;Spoon Oral Phase Impairments: Reduced labial seal Pharyngeal  Phase Impairments: Multiple swallows;Cough - Immediate;Wet Vocal Quality    Nectar Thick Nectar Thick Liquid: Impaired Presentation: Cup;Self Fed Oral Phase Impairments: Reduced labial seal Pharyngeal Phase Impairments: Multiple swallows   Honey Thick Honey Thick Liquid: Not tested   Puree Puree: Impaired Presentation: Spoon;Self  Fed Oral Phase Impairments: Reduced labial seal Pharyngeal Phase Impairments: Multiple swallows   Solid   GO   Solid: Impaired Presentation: Self Fed Oral Phase Impairments: Reduced labial seal Pharyngeal Phase Impairments: Suspected delayed Swallow;Cough - Delayed;Cough - Immediate Other Comments: cough complaint of residuals with pt coughing/expectoration of masticated cracker         [name], [place] [facility] SLP [phone]

## 2016-06-07 NOTE — Consult Note (Signed)
Name: Brenda [name] MRN: [id number] DOB: 05-19-1951    ADMISSION DATE:  06/06/2016 CONSULTATION DATE:  32/5  REFERRING MD :  watts  CHIEF COMPLAINT:  Chronic respiratory failure. Recurrent aspiration   BRIEF PATIENT DESCRIPTION:  This is a 66 year old female well known to our service. Has well documented h/o recurrent aspiration pneumonia in setting of known dysphagia and resultant episodes of acute on chronic hypoxic respiratory failure. Her most recent admit was on 12/28 when she had increase in back pain which eventually uncovered that she had new T7 compression fracture. She was discharged to home w/ plan to f/u w/ IR for possible Kyphoplasty. Was admitted on 1/4 w/ plan for Kyphoplasty however pt still w/ sig shortness of breath and airspace disease on CXR so procedure was postponed. PCCM asked to see on 1/5 to assist w/ pulmonary management in effort to "tune her up" for Kyphoplasty.    SIGNIFICANT EVENTS   STUDIES:     HISTORY OF PRESENT ILLNESS:  See above   PAST MEDICAL HISTORY :   has a past medical history of Arthritis; CAD S/P percutaneous coronary angioplasty (1999; 2001;2003, 10/2014); Cancer of base of tongue ([place]) (2010); Carotid artery occlusion; Chronic bronchitis ([name]); COPD (chronic obstructive pulmonary disease) with emphysema ([place]); Depression; GERD (gastroesophageal reflux disease); H/O hiatal hernia; History of blood transfusion; History of DVT (deep vein thrombosis); radiation therapy; radiation therapy (01/16/09 - 03/06/09); Hypercholesteremia; Myocardial infarction (1999, 2003); PAD (peripheral artery disease) ([place]); Patent foramen ovale (February 2013); Pneumonia; Stroke [facility] Transient ischemic attack (TIA) (February 2013); and Type II diabetes mellitus ([place]).  has a past surgical history that includes TEE without cardioversion (07/26/2011); Esophagogastroduodenoscopy (12/20/2011); Savory dilation (12/20/2011); Tonsillectomy (1969); Appendectomy (1962); Cardiac  catheterization (1999; 10/2005); Coronary angioplasty with stent (4098--1191); Femoral bypass (1999; 2000); Carpal tunnel release (Right, 1980's?); Vaginal hysterectomy (1988); Cesarean section (1978); Tubal ligation (1988); Cataract extraction w/ intraocular lens implant (Right, 2011); Ankle fracture surgery (Right, 2007); Bypass graft popliteal to popliteal (Right, 02/05/2013); Eye surgery; Cardiac catheterization (N/A, 10/06/2015); Cardiac catheterization (N/A, 10/06/2015); Cardiac catheterization (N/A, 10/06/2015); transthoracic echocardiogram (March 2016); Cardiac catheterization (N/A, 12/01/2015); and Cardiac catheterization (Right, 12/22/2015). Prior to Admission medications   Medication Sig Start Date End Date Taking? Authorizing Provider  acetaminophen (TYLENOL) 325 MG tablet Take 2 tablets (650 mg total) by mouth every 6 (six) hours as needed. Patient taking differently: Take 650 mg by mouth every 6 (six) hours as needed for mild pain.  05/01/16  Yes [name], PA-C  amoxicillin-clavulanate (AUGMENTIN) 875-125 MG tablet Take 1 tablet by mouth 2 (two) times daily. 06/03/16 06/10/16 Yes Dron Tanna Furry, MD  bisoprolol (ZEBETA) 5 MG tablet Take 0.5 tablets (2.5 mg total) by mouth daily. 05/15/16  Yes Orson Eva, MD  buPROPion (WELLBUTRIN XL) 300 MG 24 hr tablet Take 300 mg by mouth daily.  09/19/15  Yes Historical Provider, MD  cyclobenzaprine (FLEXERIL) 5 MG tablet Take 1-2 tablets (5-10 mg total) by mouth 3 (three) times daily as needed for muscle spasms. 05/09/16  Yes Naiping Ephriam Jenkins, MD  feeding supplement, GLUCERNA SHAKE, (GLUCERNA SHAKE) LIQD Take 237 mLs by mouth 2 (two) times daily between meals. 04/09/16  Yes Donita Brooks, NP  fish oil-omega-3 fatty acids 1000 MG capsule Take 1 g by mouth 2 (two) times daily.    Yes Historical Provider, MD  fluticasone (FLONASE) 50 MCG/ACT nasal spray Place 2 sprays into both nostrils daily. 06/02/15  Yes Debbe Odea, MD  fluticasone furoate-vilanterol (BREO  ELLIPTA) 100-25 MCG/INH AEPB Inhale 1 puff into the lungs daily. 09/13/15  Yes Tammy S Parrett, NP  guaiFENesin (MUCINEX) 600 MG 12 hr tablet Take 1,200 mg by mouth 2 (two) times daily.   Yes Historical Provider, MD  ibuprofen (ADVIL,MOTRIN) 200 MG tablet Take 400 mg by mouth every 6 (six) hours as needed (Pain).   Yes Historical Provider, MD  insulin glargine (LANTUS) 100 UNIT/ML injection Inject 0.1 mLs (10 Units total) into the skin at bedtime. Patient taking differently: Inject 10-20 Units into the skin at bedtime.  05/14/16  Yes Orson Eva, MD  insulin lispro (HUMALOG) 100 UNIT/ML injection Inject 3 Units into the skin 3 (three) times daily with meals as needed for high blood sugar.    Yes Historical Provider, MD  ipratropium-albuterol (DUONEB) 0.5-2.5 (3) MG/3ML SOLN Take 3 mLs by nebulization every 6 (six) hours. Patient taking differently: Take 3 mLs by nebulization 2 (two) times daily as needed (for wheezingt or shortness of breath).  06/02/15  Yes Debbe Odea, MD  loratadine (CLARITIN) 10 MG tablet Take 1 tablet (10 mg total) by mouth daily. 06/02/15  Yes Debbe Odea, MD  Maltodextrin-Xanthan Gum ([place]) POWD Use with thin liquids to make nectar thickened consistency 05/14/16  Yes Orson Eva, MD  nitroGLYCERIN (NITROLINGUAL) 0.4 MG/SPRAY spray Place 1 spray under the tongue every 5 (five) minutes as needed for chest pain. 01/20/15  Yes Belkys A Regalado, MD  omeprazole (PRILOSEC) 20 MG capsule Take 20 mg by mouth daily.    Yes Historical Provider, MD  oxyCODONE-acetaminophen (PERCOCET/ROXICET) 5-325 MG tablet Take 1 tablet by mouth every 6 (six) hours as needed for moderate pain or severe pain. 06/03/16  Yes Dron Tanna Furry, MD  polyethylene glycol powder (GLYCOLAX/MIRALAX) powder Take 17 g by mouth 2 (two) times daily. Patient taking differently: Take 17 g by mouth 2 (two) times daily as needed for moderate constipation.  05/01/16  Yes [name], PA-C  predniSONE  (DELTASONE) 10 MG tablet Take 4 tabs  daily with food x 4 days, then 3 tabs daily x 4 days, then 2 tabs daily x 4 days, then 1 tab daily x4 days then stop. #40 05/24/16  Yes Erick Colace, NP  rosuvastatin (CRESTOR) 20 MG tablet Take 20 mg by mouth daily.   Yes Historical Provider, MD  SPIRIVA RESPIMAT 2.5 MCG/ACT AERS Take 2 puffs by mouth daily.  10/24/15  Yes Historical Provider, MD  traMADol (ULTRAM) 50 MG tablet Take 1 tablet (50 mg total) by mouth every 6 (six) hours as needed for moderate pain or severe pain. 05/09/16  Yes Naiping Ephriam Jenkins, MD  albuterol (PROVENTIL HFA;VENTOLIN HFA) 108 (90 BASE) MCG/ACT inhaler Inhale 2 puffs into the lungs every 6 (six) hours as needed for wheezing or shortness of breath. 01/20/15   Belkys A Regalado, MD  aspirin EC 81 MG tablet Take 1 tablet (81 mg total) by mouth daily. 10/07/15   Thurnell Lose, MD  clopidogrel (PLAVIX) 75 MG tablet Take 1 tablet (75 mg total) by mouth daily. Please don't take plavix until you have kyphoplasty. May resume after the procedure, please talk your PCP. 06/10/16   Dron Tanna Furry, MD   Allergies  Allergen Reactions  . Tape Hives and Other (See Comments)    USE PAPER TAPE ONLY- Adhesive peels off skin-makes pt. Raw.  . Isosorbide Other (See Comments)    Drops BP too low  . Nicoderm [Nicotine] Rash    To the [facility] only,  breakouts on skin    FAMILY HISTORY:  family history includes Cancer in her mother and sister; Cancer (age of onset: 66) in her father; Clotting disorder in her brother; Deep vein thrombosis in her brother; Diabetes in her brother, mother, sister, and son; Hearing loss in her brother; Heart attack in her brother and father; Heart disease in her father; Hyperlipidemia in her brother, mother, and son; Hypertension in her brother and mother; Other in her mother. SOCIAL HISTORY:  reports that she quit smoking about 2 months ago. Her smoking use included Cigarettes. She has a 50.00 pack-year smoking history. She has  never used smokeless tobacco. She reports that she drinks alcohol. She reports that she does not use drugs.  REVIEW OF SYSTEMS:   Constitutional: Negative for fever, chills, weight loss, malaise/fatigue and diaphoresis.  HENT: Negative for hearing loss, ear pain, nosebleeds, congestion, sore throat, neck pain, tinnitus and ear discharge.   Eyes: Negative for blurred vision, double vision, photophobia, pain, discharge and redness.  Respiratory: still has cough, hemoptysis, yellow sputum production, shortness of breath which is more related to back pain w/ movement or cough, wheezing and stridor.   Cardiovascular: Negative for chest pain, palpitations, orthopnea, claudication, leg swelling and PND.  Gastrointestinal: Negative for heartburn, nausea, vomiting, abdominal pain, diarrhea, constipation, blood in stool and melena.  Genitourinary: Negative for dysuria, urgency, frequency, hematuria and flank pain.  Musculoskeletal: Negative for myalgias, marked back pain which is exacerbated by cough or position change.  joint pain and falls.  Skin: Negative for itching and rash.  Neurological: Negative for dizziness, tingling, tremors, sensory change, speech change, focal weakness, seizures, loss of consciousness, weakness and headaches.  Endo/Heme/Allergies: Negative for environmental allergies and polydipsia. Does not bruise/bleed easily.  SUBJECTIVE:   VITAL SIGNS: Temp:  [97.5 F (36.4 C)-99.1 F (37.3 C)] 97.5 F (36.4 C) (01/05 0546) Pulse Rate:  [76-86] 80 (01/05 0546) Resp:  [16-18] 18 (01/05 0546) BP: (117-141)/(53-76) 141/55 (01/05 0546) SpO2:  [90 %-100 %] 94 % (01/05 0835) Weight:  [117 lb 1 oz (53.1 kg)] 117 lb 1 oz (53.1 kg) (01/04 1822)  PHYSICAL EXAMINATION: General:  Chronically ill appearing white female. Resting in bed. Not in acute distress.  Neuro:  Awake, oriented. No focal def  HEENT:  NCAT. No JVD. MMM Cardiovascular:  RRR w/out MRG Lungs:  Kyphotic. Crackles and  fremitus posteriorly after cough. Other wise clear w/out wheeze Abdomen:  Soft not tender No OM.  Musculoskeletal:  Marked back pain w/ positional movement extremity strength is equal  Skin:  Warm and dry    Recent Labs Lab 06/06/16 1249 06/07/16 0547  NA 128* 136  K 4.3 4.1  CL 91* 97*  CO2 30 32  BUN 18 13  CREATININE 0.74 0.68  GLUCOSE 279* 120*    Recent Labs Lab 06/03/16 0543 06/06/16 1249 06/07/16 0547  HGB 11.0* 10.9* 11.1*  HCT 34.1* 33.7* 34.1*  WBC 6.1 10.6* 7.7  PLT 230 209 239    Recent Labs Lab 06/01/16 0507 06/03/16 0543 06/06/16 1249 06/07/16 0547  WBC 7.3 6.1 10.6* 7.7      Dg Chest Port 1 View  Result Date: 06/06/2016 CLINICAL DATA:  Mid thoracic pain secondary fractures.  Dyspnea. EXAM: PORTABLE CHEST 1 VIEW COMPARISON:  CXR 05/30/2016, MRI 05/31/2016 of the thoracic spine FINDINGS: More confluent airspace opacity in the right mid lung about the right hilum. Mild bilateral chronic interstitial prominence is noted otherwise. Heart is normal in size. The aorta is  atherosclerotic without aneurysm. No effusion or pneumothorax. IMPRESSION: Right perihilar consolidation possibly as a result of atelectasis. Pneumonia is not entirely excluded. Aortic atherosclerosis. Electronically Signed   By: Ashley Royalty M.D.   On: 06/06/2016 15:04   Right perihilar airspace disease. Not sig worse than prior cxr  ASSESSMENT / PLAN:  Chronic Respiratory Failure COPD AECOPD Recurrent aspiration PNA Atelectasis  Dysphagia  Thoracic spine compression fracture at T 7 Mild anemia  DM2 Spiculated pulmonary nodule    Discussion  This is a 66 year old female w/ chronic hypoxic respiratory failure in the setting of severe COPD and recurrent aspiration PNA. Now acutely complicated by T7 fracture which is negatively affecting her cough mechanics likely leading to mucous plugging and atelectasis.  She actually looks better than the last time we admitted her. CXR does show  right hilar airspace disease but overall not really worse (just different) than films in the past.   Plan/rec Cont supplemental O2 Cont BD (scheduled) Agree, no need to pulse steroids can keep on the '20mg'$  dosing for now. (eventually resume taper from there) Cont empiric abx; will add PCT algo From our stand-point she should be ready for Kyphoplasty as early as Monday.  Cont supportive care and pain management for the T fracture in the mean time.   Erick Colace ACNP-BC [place] Pager # [phone] OR # [phone] if no answer   06/07/2016, 10:03 AM

## 2016-06-07 NOTE — Progress Notes (Signed)
Initial Nutrition Assessment  DOCUMENTATION CODES:   Severe malnutrition in context of chronic illness  INTERVENTION:  - Continue Glucerna Shake TID, each supplement provides 220 kcal and 10 grams of protein (make sure to mix with Resource ThickenUp to make nectar-thick). - Will order Magic Cup BID with meals, each supplement provides 290 kcal and 9 grams of protein - Continue to encourage PO intakes. - Diet advancement if medically feasible. - RD will continue to monitor for additional nutrition-related needs.  NUTRITION DIAGNOSIS:   Increased nutrient needs related to chronic illness as evidenced by estimated needs.  GOAL:   Patient will meet greater than or equal to 90% of their needs  MONITOR:   PO intake, Supplement acceptance, Diet advancement, Weight trends, Labs, I & O's  REASON FOR ASSESSMENT:   Malnutrition Screening Tool  ASSESSMENT:    66 y.o. female significant past medical history of chronic obstructive pulmonary disease with chronic respiratory respiratory failure on home O2 at night, coronary artery disease status post PCI, tongue cancer status post radiation therapy with complaints of dysphagia and recurrent aspiration pneumonia, peripheral vascular disease, recently discharged from the hospital after a four-day admission on 06/03/2016 for which she was treated for aspiration pneumonia.  The patient was scheduled for kyphoplasty for vertebral fractures today and presented to IR today for kyphoplasty. However, she was noted to be short of breath with oxygen sats noted in low 90s.  She was done which showed progression of her pneumonia. Dr. Pascal Lux requested. The patient be admitted for pneumonia. The patient off Plavix for now for possible kyphoplasty early next week if patient is medically improved.    Pt seen for MST. BMI indicates normal weight. Pt advanced from NPO to FLD, nectar-thick following SLP evaluation earlier today. Note from that encounter reviewed and pt  also talks about "agreement" made with SLP for sips of thin liquids. She states that at home she had difficulty swallowing some items and that she would need items such as vegetables and rice mixed with gravy or broth to help items slide down her throat.   Pt reports she is s/p chemo and radiation in 2010 for tongue cancer and that since that time she has had lack of taste/altered taste as well as dry mouth; she uses Biotene and other mouth sprays to assist with this.   Pt reports that she drinks Glucerna Shake at home and is interested in continuing this during hospitalization. She tries to drink at least 3-4/day but sometimes is able to drink up to 6/day if she is not consuming other sources of nutrition. Please allow pt to have more than 3 shakes/day if she requests.   Physical assessment shows moderate and severe muscle and moderate and severe fat wasting. Pt reports she weighed 139 lbs 6 months ago and has been losing weight to weigh 117 lbs since that time. This indicates 22 lb weight loss (16% body weight) in 6 months which is significant for time frame.  Medications reviewed; PRN Dulcolax, sliding scale Novolog, 40 mg oral Protonix/day, PRN Miralax, 20 mg Deltasone/day, PRN Senokot.  Labs reviewed; CBG: 116 mg/dL, Cl: 97 mmol/L, Ca: 8.7 mg/dL, Alk Phos elevated.    Diet Order:  Diet full liquid Room service appropriate? Yes; Fluid consistency: Nectar Thick  Skin:  Reviewed, no issues  Last BM:  PTA/unknown  Height:   Ht Readings from Last 1 Encounters:  06/06/16 '5\' 5"'$  (1.651 m)    Weight:   Wt Readings from Last 1 Encounters:  06/06/16 117 lb 1 oz (53.1 kg)    Ideal Body Weight:  56.82 kg  BMI:  Body mass index is 19.48 kg/m.  Estimated Nutritional Needs:   Kcal:  1500-1700 (~28-32 kcal/kg)  Protein:  75-85 grams (1.4-1.6 grams/kg)  Fluid:  1.5-1.7 L/day  EDUCATION NEEDS:   No education needs identified at this time    Jarome Matin, MS, RD, LDN,  CNSC Inpatient Clinical Dietitian Pager # [phone] After hours/weekend pager # [phone]

## 2016-06-07 NOTE — Progress Notes (Signed)
[facility] PROGRESS NOTE    Progress Note  Brenda [name]  UKG:[id number] DOB: 08-May-1951 DOA: 06/06/2016 PCP: [name], MD     Brief Narrative:   Brenda [name] is an 66 y.o. 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 Scheduled for kyphoplasty and comes in today for dyspnea with desaturations  Assessment/Plan:   Acute respiratory failure with hypoxia due to Recurrent aspiration pneumonia/Dysphagia with aspiration: She was initially planned for kyphoplasty however due to hypoxia and possible recurrent aspiration, this will have to be deferred. Cultures have been drawn and are pending, urine Legionella pending and strep pneumo  and influenza PCR negative Change Abx to Unasyn IV. MRSA is negative will DC the IV vancomycin. D/w patient the recurrence of he aspiration episode and she would like to meet with PMT.   DM (diabetes mellitus), type 2, uncontrolled ([place]) Continue hold Lantus, she is nothing by mouth continue CBGs every 4 hours, continue sliding scale insulin.  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  Closed compression fracture of thoracic vertebra [facility]) Admitting physician discussed with Dr. Pascal Lux, and agree to hold Plavix for possible kyphoplasty before discharge.  Tongue cancer with history of dysphagia, - radiation 2010 We'll do pured thickened liquids diet for now. Last speech therapy to evaluate her in the morning for further recommendations  DVT prophylaxis: lovenox Family Communication:none Disposition Plan/Barrier to D/C: home in 3 days Code Status:     Code Status Orders        Start     Ordered   06/06/16 1844  Full code   Continuous     06/06/16 1844    Code Status History    Date Active Date Inactive Code Status Order ID Comments User Context   05/30/2016  7:59 PM 06/03/2016  6:56 PM Full Code [id number]  Etta Quill, DO ED   05/20/2016 12:52 PM 05/24/2016  3:13 PM Full Code [id number]  Erick Colace, NP Inpatient   05/10/2016  2:57 AM 05/10/2016  2:57 AM Full Code [id number]  Edwin Dada, MD Inpatient   05/10/2016  2:57 AM 05/14/2016  6:45 PM Full Code [id number]  Edwin Dada, MD Inpatient   04/03/2016 10:18 AM 04/03/2016 10:23 AM Full Code [id number]  Marijean Heath, NP Inpatient   12/22/2015  2:59 PM 12/22/2015  6:05 PM Full Code [id number]  Rosetta Posner, MD Inpatient   12/01/2015 10:29 AM 12/01/2015  6:50 PM Full Code [id number]  Elam Dutch, MD Inpatient   10/05/2015  8:48 PM 10/07/2015  1:44 PM Full Code [id number]  Ivor Costa, MD Inpatient   05/28/2015  3:29 PM 06/02/2015  3:41 PM Full Code [id number]  Eugenie Filler, MD Inpatient   01/17/2015  6:52 PM 01/20/2015  4:17 PM Full Code [id number]  Oswald Hillock, MD Inpatient   08/14/2014  8:28 PM 08/18/2014  9:39 PM Full Code [id number]  Melton Alar, PA-C Inpatient   08/14/2014  8:07 PM 08/14/2014  8:28 PM Full Code [id number]  Alric Quan, MD ED   02/05/2013  4:50 PM 02/09/2013  2:52 PM Full Code [id number]  Gabriel Earing, PA-C Inpatient        IV Access:    Peripheral IV   Procedures and diagnostic studies:  Dg Chest Port 1 View  Result Date: 06/06/2016 CLINICAL DATA:  Mid thoracic pain secondary fractures.  Dyspnea. EXAM: PORTABLE CHEST 1 VIEW COMPARISON:  CXR 05/30/2016, MRI 05/31/2016 of the thoracic spine FINDINGS: More confluent airspace opacity in the right mid lung about the right hilum. Mild bilateral chronic interstitial prominence is noted otherwise. Heart is normal in size. The aorta is atherosclerotic without aneurysm. No effusion or pneumothorax. IMPRESSION: Right perihilar consolidation possibly as a result of  atelectasis. Pneumonia is not entirely excluded. Aortic atherosclerosis. Electronically Signed   By: Ashley Royalty M.D.   On: 06/06/2016 15:04     Medical Consultants:    None.  Anti-Infectives:   Unasyn  Subjective:    Brenda [name] relates her breathing is better  Objective:    Vitals:   06/06/16 1620 06/06/16 1822 06/07/16 0546 06/07/16 0835  BP: (!) 140/57 122/76 (!) 141/55   Pulse: 76 79 80   Resp: '17 16 18   '$ Temp: 98.8 F (37.1 C) 99 F (37.2 C) 97.5 F (36.4 C)   TempSrc: Oral Oral Oral   SpO2: 100% 100% 93% 94%  Weight:  53.1 kg (117 lb 1 oz)    Height:  '5\' 5"'$  (1.651 m)      Intake/Output Summary (Last 24 hours) at 06/07/16 0844 Last data filed at 06/07/16 0600  Gross per 24 hour  Intake           888.75 ml  Output              600 ml  Net           288.75 ml   Filed Weights   06/06/16 1822  Weight: 53.1 kg (117 lb 1 oz)    Exam: General exam: In no acute distress. Respiratory system: Good air movement and crackles on the right. Cardiovascular system: S1 & S2 heard, RRR. No JVD. Gastrointestinal system: Abdomen is nondistended, soft and nontender.  Extremities: No pedal edema. Skin: No rashes, lesions or ulcers Psychiatry: Judgement and insight appear normal. Mood & affect appropriate.    Data Reviewed:    Labs: Basic Metabolic Panel:  Recent Labs Lab 06/06/16 1249 06/07/16 0547  NA 128* 136  K 4.3 4.1  CL 91* 97*  CO2 30 32  GLUCOSE 279* 120*  BUN 18 13  CREATININE 0.74 0.68  CALCIUM 8.7* 8.7*   GFR Estimated Creatinine Clearance: 58.8 mL/min (by C-G formula based on SCr of 0.68 mg/dL). Liver Function Tests:  Recent Labs Lab 06/07/16 0547  AST 15  ALT 15  ALKPHOS 149*  BILITOT 0.7  PROT 5.7*  ALBUMIN 2.8*   No results for input(s): LIPASE, AMYLASE in the last 168 hours. No results for input(s): AMMONIA in the last 168 hours. Coagulation profile  Recent Labs Lab 06/03/16 0543  INR 0.96    CBC:  Recent  Labs Lab 06/01/16 0507 06/03/16 0543 06/06/16 1249 06/07/16 0547  WBC 7.3 6.1 10.6* 7.7  NEUTROABS  --  4.5 9.4*  --   HGB 10.4* 11.0* 10.9* 11.1*  HCT 33.6* 34.1* 33.7* 34.1*  MCV 89.1 90.2 88.5 90.9  PLT 245 230 209 239   Cardiac Enzymes: No results for input(s): CKTOTAL, CKMB, CKMBINDEX, TROPONINI in the last 168 hours. BNP (last 3 results) No results for input(s): PROBNP in the last 8760 hours. CBG:  Recent Labs Lab 06/03/16 0730 06/03/16 1150 06/06/16 1251 06/06/16 1653 06/06/16 2320  GLUCAP 135* 222* 284* 235* 136*   D-Dimer: No results  for input(s): DDIMER in the last 72 hours. Hgb A1c: No results for input(s): HGBA1C in the last 72 hours. Lipid Profile: No results for input(s): CHOL, HDL, LDLCALC, TRIG, CHOLHDL, LDLDIRECT in the last 72 hours. Thyroid function studies: No results for input(s): TSH, T4TOTAL, T3FREE, THYROIDAB in the last 72 hours.  Invalid input(s): FREET3 Anemia work up: No results for input(s): VITAMINB12, FOLATE, FERRITIN, TIBC, IRON, RETICCTPCT in the last 72 hours. Sepsis Labs:  Recent Labs Lab 06/01/16 0507 06/03/16 0543 06/06/16 1249 06/07/16 0547  WBC 7.3 6.1 10.6* 7.7   Microbiology No results found for this or any previous visit (from the past 240 hour(s)).   Medications:   . bisoprolol  2.5 mg Oral Daily  . buPROPion  300 mg Oral Daily  . ceFEPime (MAXIPIME) IV  1 g Intravenous Q8H  . feeding supplement (GLUCERNA SHAKE)  237 mL Oral BID BM  . fluticasone  2 spray Each Nare Daily  . fluticasone furoate-vilanterol  1 puff Inhalation Daily  . guaiFENesin  1,200 mg Oral BID  . heparin  5,000 Units Subcutaneous Q8H  . insulin aspart  0-9 Units Subcutaneous TID WC  . loratadine  10 mg Oral Daily  . mouth rinse  15 mL Mouth Rinse BID  . omega-3 acid ethyl esters  1 g Oral BID  . pantoprazole  40 mg Oral Daily  . predniSONE  20 mg Oral Q breakfast  . rosuvastatin  20 mg Oral Daily  . tiotropium  18 mcg Inhalation Daily   . vancomycin  500 mg Intravenous Q12H   Continuous Infusions:  Time spent: 25 min   LOS: 1 day   Brenda [name]  [facility] Pager [phone]  *Please refer to [email or web address], password [email or web address] to get updated schedule on who will round on this patient, as hospitalists switch teams weekly. If 7PM-7AM, please contact night-coverage at [email or web address], password [email or web address] for any overnight needs.  06/07/2016, 8:44 AM

## 2016-06-08 DIAGNOSIS — Z794 Long term (current) use of insulin: Secondary | ICD-10-CM

## 2016-06-08 DIAGNOSIS — J42 Unspecified chronic bronchitis: Secondary | ICD-10-CM

## 2016-06-08 DIAGNOSIS — Z7189 Other specified counseling: Secondary | ICD-10-CM

## 2016-06-08 DIAGNOSIS — E1165 Type 2 diabetes mellitus with hyperglycemia: Secondary | ICD-10-CM

## 2016-06-08 DIAGNOSIS — Z515 Encounter for palliative care: Secondary | ICD-10-CM

## 2016-06-08 DIAGNOSIS — E1121 Type 2 diabetes mellitus with diabetic nephropathy: Secondary | ICD-10-CM

## 2016-06-08 NOTE — Progress Notes (Addendum)
Name: Brenda [name] MRN: [id number] DOB: 12/23/1950    ADMISSION DATE:  06/06/2016 CONSULTATION DATE:  74/5  REFERRING MD :  watts  CHIEF COMPLAINT:  Chronic respiratory failure. Recurrent aspiration   BRIEF PATIENT DESCRIPTION:  This is a 67 yowf with  documented h/o recurrent aspiration pneumonia in setting of known dysphagia and resultant episodes of acute on chronic hypoxic respiratory failure. Her most recent admit was on 12/28 when she had increase in back pain which eventually uncovered that she had new T7 compression fracture. She was discharged to home w/ plan to f/u w/ IR for possible Kyphoplasty. Was admitted on 1/4 w/ plan for Kyphoplasty however pt still w/ sig shortness of breath and airspace disease on CXR so procedure was postponed. PCCM asked to see on 1/5 to assist w/ pulmonary management in effort to "tune her up" for Kyphoplasty.   SUBJECTIVE:  nad at rest/30 degrees hob/ rattling congested sounding cough  VITAL SIGNS: Temp:  [97.4 F (36.3 C)-98.4 F (36.9 C)] 97.4 F (36.3 C) (01/06 0542) Pulse Rate:  [72-77] 77 (01/06 0542) Resp:  [19-20] 19 (01/06 0542) BP: (103-145)/(61-87) 116/67 (01/06 0542) SpO2:  [93 %-99 %] 99 % (01/06 0848) 02  3lpm NP   PHYSICAL EXAMINATION: General:  Chronically ill appearing white female. Resting in bed. Not in acute distress.  Neuro:  Awake, oriented. No focal def  HEENT:  NCAT. No JVD. MMM Cardiovascular:  RRR w/out MRG Lungs:  Kyphotic. Scattered junky sounding rhonchi on insp / exp/ clear only partially with cough  Abdomen:  Soft not tender No OM.  Musculoskeletal:  Still with Marked back pain w/ positional movement extremity strength is equal  Skin:  Warm and dry    Recent Labs Lab 06/06/16 1249 06/07/16 0547  NA 128* 136  K 4.3 4.1  CL 91* 97*  CO2 30 32  BUN 18 13  CREATININE 0.74 0.68  GLUCOSE 279* 120*    Recent Labs Lab 06/03/16 0543 06/06/16 1249 06/07/16 0547  HGB 11.0* 10.9* 11.1*  HCT 34.1*  33.7* 34.1*  WBC 6.1 10.6* 7.7  PLT 230 209 239    Recent Labs Lab 06/03/16 0543 06/06/16 1249 06/07/16 0547  WBC 6.1 10.6* 7.7      Dg Chest Port 1 View  Result Date: 06/06/2016 CLINICAL DATA:  Mid thoracic pain secondary fractures.  Dyspnea. EXAM: PORTABLE CHEST 1 VIEW COMPARISON:  CXR 05/30/2016, MRI 05/31/2016 of the thoracic spine FINDINGS: More confluent airspace opacity in the right mid lung about the right hilum. Mild bilateral chronic interstitial prominence is noted otherwise. Heart is normal in size. The aorta is atherosclerotic without aneurysm. No effusion or pneumothorax. IMPRESSION: Right perihilar consolidation possibly as a result of atelectasis. Pneumonia is not entirely excluded. Aortic atherosclerosis. Electronically Signed   By: Ashley Royalty M.D.   On: 06/06/2016 15:04       ASSESSMENT / PLAN:  Chronic Respiratory Failure COPD  GOLD IV by pfts 11/2014  AECOPD Recurrent aspiration PNA Atelectasis  Dysphagia  Thoracic spine compression fracture at T 7 Mild anemia  DM2 Spiculated pulmonary nodule    Discussion  This is a 66 year old female w/ chronic hypoxic respiratory failure in the setting of severe COPD and recurrent aspiration PNA. Now acutely complicated by T7 fracture which is negatively affecting her cough mechanics likely leading to mucous plugging and atelectasis.   Has very poor cough mechanics and says left flutter valve at home but asking husband to bring it  in   Don't see need for fish oil in pt with tendency to aspirate/ chronic coughing with risk lipoid pna  c    Plan/rec Cont supplemental O2 Cont BD (scheduled)  can keep on the '20mg'$  dosing for now. (eventually resume taper from there) Cont empiric abx  From our stand-point she should be ready for Kyphoplasty as early as Monday 06/12/15  Cont supportive care and pain management for the T fracture in the mean time.  D/c fish oil    Christinia Gully, MD Pulmonary and [place] [phone] After 5:30 PM or weekends, call [phone]

## 2016-06-08 NOTE — Consult Note (Signed)
Consultation Note Date: 06/08/2016   Patient Name: Brenda [name]  DOB: Mar 05, 1951  MRN: [id number]  Age / Sex: 66 y.o., female  PCP: [name], MD Referring Physician: Charlynne Cousins, MD  Reason for Consultation: Establishing goals of care and Psychosocial/spiritual support  HPI/Patient Profile: 66 y.o. female  with past medical history of compression fracture, recurrent aspiration PNA, dysphagia, and chronic respiratory failure admitted on 06/06/2016 with plan for kyphoplasty but found to have SOB and CXR findings with airspace disease.  Palliative consulted to address goals of care in light of her recurrent aspiration and lung disease.   Clinical Assessment and Goals of Care: I met today with [name] and her youngest daughter.  She reports that the most important thing to her is her family.  Her husband has his own medical problems and is unable to help care for her.  She has 2 sons and 2 daughters.  Her oldest daughter, Brenda [name], works in Physicist, medical in some capacity, and she reports that she relies on her help to make decisions.  We discussed that she has a chronic illness that continues to worsen, and she reports that she has had a "rough couple months" with continued decline in her functional status.  We discussed advance care planning, a MOST form, and possibly completing HCPOA.  We discussed that the hospital can be useful as long as she is getting well enough from care she receives at the hospital to enjoy time at home, but there is going to come a time in the future where, if her goal is to be at home, she may be better served to plan on being at home with a focus on remaining comfortable and bringing care to her at home rather repeated trips to the hospital. We discussed hospice as a tool that may be beneficial in this goal when she reaches a point where we are trying to fix problems that are not  fixable.  SUMMARY OF RECOMMENDATIONS   - She is hopeful to complete kyphoplasty and get back to home early next week. - She would like to name her daughter, Brenda [name], as her surrogate Media planner.  I have placed a referral to chaplain services to follow up on this. - We discussed her code status.  She does not want CPR or to ever be placed on a ventilator.  She would want a trail of Bipap or pressors if needed.  I updated code status to Partial Code to reflect these wishes. - We discussed completion of a MOST form prior to discharge.  I left a copy of a MOST and Hard Choices for [place] for her to review with her family.  She has my card and will call if she wants to complete MOST or have another meeting with the rest of her family prior to discharge. - She is not interested in hospice at this time.  While she has had recurrent hospitalizations in the last 2 months, her recovery has been complicated by her compression fracture and  she want to see if she can improve from current illness and regain some functional status following kyphoplasty.  She does understand that she is very ill and trusts Dr. Lake Bells to help her make decisions about her long term care.  If she continues to decline, I recommended that she continue to speak with him to determine if she may be better served by focusing her care on staying at home with support of organization such as hospice.  Code Status/Advance Care Planning:  Limited code- No CPR, No Vent, OK with Bipap or pressors  Additional Recommendations (Limitations, Scope, Preferences):  Full Scope Treatment until cardiac or respiratory arrest  Psycho-social/Spiritual:   Desire for further Chaplaincy support:yes  Additional Recommendations: Caregiving  Support/Resources  Prognosis:   < 6 months most likely.  Certainly her expected prognosis is less than 6 months if she were to forgo continued hospitalizations and aggressive interventions.  While her goal at  this time is NOT for hospice, she should qualify at any point in the future if so desired.  She would like to continue this conversation with Dr. Lake Bells in follow-up.  Discharge Planning: To Be Determined      Primary Diagnoses: Present on Admission: . Acute on chronic respiratory failure with hypoxemia ([place]) . COPD (chronic obstructive pulmonary disease) ([place]) . DM (diabetes mellitus), type 2, uncontrolled ([place]) . Cancer of base of tongue- radiation 2010 . Recurrent aspiration pneumonia ([place]) . Closed compression fracture of thoracic vertebra ([place])   I have reviewed the medical record, interviewed the patient and family, and examined the patient. The following aspects are pertinent.  Past Medical History:  Diagnosis Date  . Arthritis    "hands" (11/26/2012)  . CAD S/P percutaneous coronary angioplasty 1999; 2001;2003, 10/2014   a) BMS- mCx; b) re-do PCI for prog of Dz (NIR BMS 3.5 x 12); c) staged PCI: p-mRCA Express II BMS 3.0x24 & 3.5 x 16 --> p-mLAD Cyper DES 3.0 x 13 & PTCA of D2 (2.5 balloon); 10/2015 PCI p-m LAD Promus DES 2.75 x 38 (covers pre&post-stent ISR), POBA of ostial rPDA  . Cancer of base of tongue ([place]) 2010   "& lymph nodes @ right neck" (11/26/2012  . Carotid artery occlusion   . Chronic bronchitis ([place])   . COPD (chronic obstructive pulmonary disease) with emphysema ([place])   . Depression   . GERD (gastroesophageal reflux disease)   . H/O hiatal hernia   . History of blood transfusion   . History of DVT (deep vein thrombosis)   . Hx of radiation therapy   . Hx of radiation therapy 01/16/09 - 03/06/09   base of tongue, right neck node  . Hypercholesteremia   . Myocardial infarction 1999, 2003  . PAD (peripheral artery disease) ([name])    status post bilateral femoropopliteal bypass grafting --> Dr. Sherren Mocha Early; -  02/2013 Revision of R Fem-AKPop to BK Pop.  . Patent foramen ovale February 2013   Small. Small PFO noted on TTE for TIA/CVA R-L + Bubble study with  valsalva  . Pneumonia   . Stroke [facility])    left side weakness remains (11/26/2012)  . Transient ischemic attack (TIA) February 2013  . Type II diabetes mellitus ([place])    Social History   Social History  . Marital status: Married    Spouse name: N/A  . Number of children: N/A  . Years of education: N/A   Occupational History  . house wife    Social History Main Topics  .  Smoking status: Former Smoker    Packs/day: 1.00    Years: 50.00    Types: Cigarettes    Quit date: 04/03/2016  . Smokeless tobacco: Never Used     Comment: up to 0.5 ppd 02/13/16  . Alcohol use Yes     Comment: occ  . Drug use: No  . Sexual activity: No   Other Topics Concern  . None   Social History Narrative  . None   Family History  Problem Relation Age of Onset  . Cancer Mother     pancreatic  . Diabetes Mother   . Hyperlipidemia Mother   . Hypertension Mother   . Other Mother     varicose vein  . Cancer Father 72    throat  . Heart disease Father   . Heart attack Father   . Cancer Sister     breast  . Diabetes Sister   . Deep vein thrombosis Brother   . Diabetes Brother   . Hearing loss Brother   . Hyperlipidemia Brother   . Hypertension Brother   . Heart attack Brother   . Clotting disorder Brother   . Diabetes Son   . Hyperlipidemia Son    Scheduled Meds: . ampicillin-sulbactam (UNASYN) IV  1.5 g Intravenous Q6H  . bisoprolol  2.5 mg Oral Daily  . buPROPion  300 mg Oral Daily  . calcitonin (salmon)  1 spray Alternating Nares Daily  . feeding supplement (GLUCERNA SHAKE)  237 mL Oral TID BM  . fluticasone  2 spray Each Nare Daily  . fluticasone furoate-vilanterol  1 puff Inhalation Daily  . guaiFENesin  1,200 mg Oral BID  . heparin  5,000 Units Subcutaneous Q8H  . insulin aspart  0-9 Units Subcutaneous TID WC  . ketorolac  15 mg Intravenous Q6H  . loratadine  10 mg Oral Daily  . mouth rinse  15 mL Mouth Rinse BID  . pantoprazole  40 mg Oral Daily  . predniSONE  20 mg Oral  Q breakfast  . rosuvastatin  20 mg Oral Daily  . tiotropium  18 mcg Inhalation Daily   Continuous Infusions: PRN Meds:.acetaminophen, albuterol, bisacodyl, cyclobenzaprine, ibuprofen, ipratropium-albuterol, magnesium citrate, morphine injection, nitroGLYCERIN, oxyCODONE-acetaminophen, polyethylene glycol, RESOURCE THICKENUP CLEAR, senna-docusate, traMADol Medications Prior to Admission:  Prior to Admission medications   Medication Sig Start Date End Date Taking? Authorizing Provider  acetaminophen (TYLENOL) 325 MG tablet Take 2 tablets (650 mg total) by mouth every 6 (six) hours as needed. Patient taking differently: Take 650 mg by mouth every 6 (six) hours as needed for mild pain.  05/01/16  Yes [name], PA-C  amoxicillin-clavulanate (AUGMENTIN) 875-125 MG tablet Take 1 tablet by mouth 2 (two) times daily. 06/03/16 06/10/16 Yes Dron Tanna Furry, MD  bisoprolol (ZEBETA) 5 MG tablet Take 0.5 tablets (2.5 mg total) by mouth daily. 05/15/16  Yes Orson Eva, MD  buPROPion (WELLBUTRIN XL) 300 MG 24 hr tablet Take 300 mg by mouth daily.  09/19/15  Yes Historical Provider, MD  cyclobenzaprine (FLEXERIL) 5 MG tablet Take 1-2 tablets (5-10 mg total) by mouth 3 (three) times daily as needed for muscle spasms. 05/09/16  Yes Naiping Ephriam Jenkins, MD  feeding supplement, GLUCERNA SHAKE, (GLUCERNA SHAKE) LIQD Take 237 mLs by mouth 2 (two) times daily between meals. 04/09/16  Yes Donita Brooks, NP  fish oil-omega-3 fatty acids 1000 MG capsule Take 1 g by mouth 2 (two) times daily.    Yes Historical Provider, MD  fluticasone (FLONASE) 50 MCG/ACT  nasal spray Place 2 sprays into both nostrils daily. 06/02/15  Yes Debbe Odea, MD  fluticasone furoate-vilanterol (BREO ELLIPTA) 100-25 MCG/INH AEPB Inhale 1 puff into the lungs daily. 09/13/15  Yes Tammy S Parrett, NP  guaiFENesin (MUCINEX) 600 MG 12 hr tablet Take 1,200 mg by mouth 2 (two) times daily.   Yes Historical Provider, MD  ibuprofen (ADVIL,MOTRIN) 200 MG tablet  Take 400 mg by mouth every 6 (six) hours as needed (Pain).   Yes Historical Provider, MD  insulin glargine (LANTUS) 100 UNIT/ML injection Inject 0.1 mLs (10 Units total) into the skin at bedtime. Patient taking differently: Inject 10-20 Units into the skin at bedtime.  05/14/16  Yes Orson Eva, MD  insulin lispro (HUMALOG) 100 UNIT/ML injection Inject 3 Units into the skin 3 (three) times daily with meals as needed for high blood sugar.    Yes Historical Provider, MD  ipratropium-albuterol (DUONEB) 0.5-2.5 (3) MG/3ML SOLN Take 3 mLs by nebulization every 6 (six) hours. Patient taking differently: Take 3 mLs by nebulization 2 (two) times daily as needed (for wheezingt or shortness of breath).  06/02/15  Yes Debbe Odea, MD  loratadine (CLARITIN) 10 MG tablet Take 1 tablet (10 mg total) by mouth daily. 06/02/15  Yes Debbe Odea, MD  Maltodextrin-Xanthan Gum ([place]) POWD Use with thin liquids to make nectar thickened consistency 05/14/16  Yes Orson Eva, MD  nitroGLYCERIN (NITROLINGUAL) 0.4 MG/SPRAY spray Place 1 spray under the tongue every 5 (five) minutes as needed for chest pain. 01/20/15  Yes Belkys A Regalado, MD  omeprazole (PRILOSEC) 20 MG capsule Take 20 mg by mouth daily.    Yes Historical Provider, MD  oxyCODONE-acetaminophen (PERCOCET/ROXICET) 5-325 MG tablet Take 1 tablet by mouth every 6 (six) hours as needed for moderate pain or severe pain. 06/03/16  Yes Dron Tanna Furry, MD  polyethylene glycol powder (GLYCOLAX/MIRALAX) powder Take 17 g by mouth 2 (two) times daily. Patient taking differently: Take 17 g by mouth 2 (two) times daily as needed for moderate constipation.  05/01/16  Yes [name], PA-C  predniSONE (DELTASONE) 10 MG tablet Take 4 tabs  daily with food x 4 days, then 3 tabs daily x 4 days, then 2 tabs daily x 4 days, then 1 tab daily x4 days then stop. #40 05/24/16  Yes Erick Colace, NP  rosuvastatin (CRESTOR) 20 MG tablet Take 20 mg by mouth daily.    Yes Historical Provider, MD  SPIRIVA RESPIMAT 2.5 MCG/ACT AERS Take 2 puffs by mouth daily.  10/24/15  Yes Historical Provider, MD  traMADol (ULTRAM) 50 MG tablet Take 1 tablet (50 mg total) by mouth every 6 (six) hours as needed for moderate pain or severe pain. 05/09/16  Yes Naiping Ephriam Jenkins, MD  albuterol (PROVENTIL HFA;VENTOLIN HFA) 108 (90 BASE) MCG/ACT inhaler Inhale 2 puffs into the lungs every 6 (six) hours as needed for wheezing or shortness of breath. 01/20/15   Belkys A Regalado, MD  aspirin EC 81 MG tablet Take 1 tablet (81 mg total) by mouth daily. 10/07/15   Thurnell Lose, MD  clopidogrel (PLAVIX) 75 MG tablet Take 1 tablet (75 mg total) by mouth daily. Please don't take plavix until you have kyphoplasty. May resume after the procedure, please talk your PCP. 06/10/16   Dron Tanna Furry, MD   Allergies  Allergen Reactions  . Tape Hives and Other (See Comments)    USE PAPER TAPE ONLY- Adhesive peels off skin-makes pt. Raw.  . Isosorbide Other (See  Comments)    Drops BP too low  . Nicoderm [Nicotine] Rash    To the PATCH only, breakouts on skin   Review of Systems  Constitutional: Positive for activity change and fatigue.  Respiratory: Positive for shortness of breath.   Musculoskeletal: Positive for back pain.  Neurological: Positive for weakness.  Psychiatric/Behavioral: Positive for sleep disturbance.    Physical Exam  General: Alert, awake, in no acute distress. Chronic ill appearing. HEENT: No bruits, no goiter, no JVD Heart: Regular rate and rhythm. No murmur appreciated. Lungs: Fair air movement, coarse throughout Abdomen: Soft, nontender, nondistended, positive bowel sounds.  Ext: No significant edema Skin: Warm and dry Neuro: Grossly intact, nonfocal.  Vital Signs: BP 110/62 (BP Location: Right Arm)   Pulse 71   Temp 97.8 F (36.6 C) (Oral)   Resp 19   Ht '5\' 5"'$  (1.651 m)   Wt 53.1 kg (117 lb 1 oz)   LMP  (LMP Unknown)   SpO2 97%   BMI 19.48 kg/m  Pain  Assessment: 0-10   Pain Score: 2    SpO2: SpO2: 97 % O2 Device:SpO2: 97 % O2 Flow Rate: .O2 Flow Rate (L/min): 3 L/min  IO: Intake/output summary:  Intake/Output Summary (Last 24 hours) at 06/08/16 1645 Last data filed at 06/08/16 1515  Gross per 24 hour  Intake              700 ml  Output             1300 ml  Net             -600 ml    LBM: Last BM Date: 05/29/16 (per patient. ) Baseline Weight: Weight: 53.1 kg (117 lb 1 oz) Most recent weight: Weight: 53.1 kg (117 lb 1 oz)     Palliative Assessment/Data:   Flowsheet Rows   Flowsheet Row Most Recent Value  Intake Tab  Referral Department  Hospitalist  Unit at Time of Referral  Cardiac/Telemetry Unit  Palliative Care Primary Diagnosis  Pulmonary  Date Notified  06/07/16  Palliative Care Type  Return patient Palliative Care  Reason for referral  Clarify Goals of Care  Date of Admission  06/06/16  Date first seen by Palliative Care  06/08/16  # of days Palliative referral response time  1 Day(s)  # of days IP prior to Palliative referral  1  Clinical Assessment  Psychosocial & Spiritual Assessment  Palliative Care Outcomes  Patient/Family meeting held?  Yes  Who was at the meeting?  Patient, youngest daughter      Time In: 3 Time Out: 1640 Time Total: 39 Greater than 50%  of this time was spent counseling and coordinating care related to the above assessment and plan.  Signed by: Micheline Rough, MD   Please contact Palliative Medicine Team phone at [phone] for questions and concerns.  For individual provider: See Shea Evans

## 2016-06-08 NOTE — Progress Notes (Signed)
I discussed with patient and her daughter regarding heroic interventions at the end-of-life.  She would like trial of Bipap or pressors, but she does NOT want intubation, CPR or defibrilation.  Updated to partial code.  Full consult note to follow.  Micheline Rough, MD [place] Team [phone]

## 2016-06-08 NOTE — Progress Notes (Signed)
[facility] PROGRESS NOTE    Progress Note  Brenda [name]  WGN:[id number] DOB: 03/26/1951 DOA: 06/06/2016 PCP: [name], MD     Brief Narrative:   Brenda [name] is an 66 y.o. 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 Scheduled for kyphoplasty and comes in today for dyspnea with desaturations  Assessment/Plan:   Acute respiratory failure with hypoxia due to Recurrent aspiration pneumonia/Dysphagia with aspiration: Change abx to oral augmentin in am Cultures have been drawn and are pending, urine Legionella pending and strep pneumo  and influenza PCR negative Change steroids to orals.  DM (diabetes mellitus), type 2, uncontrolled ([place]) Continue hold Lantus, she is nothing by mouth continue CBGs every 4 hours, continue sliding scale insulin.  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  Closed compression fracture of thoracic vertebra [facility]) Admitting physician discussed with Dr. Pascal Lux, and agree to hold Plavix for possible kyphoplasty before discharge.  Tongue cancer with history of dysphagia, - radiation 2010 We'll do pured thickened liquids diet for now. Last speech therapy to evaluate her in the morning for further recommendations  DVT prophylaxis: lovenox Family Communication:none Disposition Plan/Barrier to D/C: home in 2 days Code Status:     Code Status Orders        Start     Ordered   06/06/16 1844  Full code  Continuous     06/06/16 1844    Code Status History    Date Active Date Inactive Code Status Order ID Comments User Context   05/30/2016  7:59 PM 06/03/2016  6:56 PM Full Code [id number]  Etta Quill, DO ED   05/20/2016 12:52  PM 05/24/2016  3:13 PM Full Code [id number]  Erick Colace, NP Inpatient   05/10/2016  2:57 AM 05/10/2016  2:57 AM Full Code [id number]  Edwin Dada, MD Inpatient   05/10/2016  2:57 AM 05/14/2016  6:45 PM Full Code [id number]  Edwin Dada, MD Inpatient   04/03/2016 10:18 AM 04/03/2016 10:23 AM Full Code [id number]  Marijean Heath, NP Inpatient   12/22/2015  2:59 PM 12/22/2015  6:05 PM Full Code [id number]  Rosetta Posner, MD Inpatient   12/01/2015 10:29 AM 12/01/2015  6:50 PM Full Code [id number]  Elam Dutch, MD Inpatient   10/05/2015  8:48 PM 10/07/2015  1:44 PM Full Code [id number]  Ivor Costa, MD Inpatient   05/28/2015  3:29 PM 06/02/2015  3:41 PM Full Code [id number]  Eugenie Filler, MD Inpatient   01/17/2015  6:52 PM 01/20/2015  4:17 PM Full Code [id number]  Oswald Hillock, MD Inpatient   08/14/2014  8:28 PM 08/18/2014  9:39 PM Full Code [id number]  Melton Alar, PA-C Inpatient   08/14/2014  8:07 PM 08/14/2014  8:28 PM Full Code [id number]  Alric Quan, MD ED   02/05/2013  4:50 PM 02/09/2013  2:52 PM Full Code [id number]  Gabriel Earing, PA-C Inpatient        IV Access:    Peripheral IV   Procedures and diagnostic studies:   Dg Chest Port 1 View  Result Date: 06/06/2016 CLINICAL DATA:  Mid thoracic pain secondary fractures.  Dyspnea. EXAM: PORTABLE CHEST 1 VIEW COMPARISON:  CXR 05/30/2016, MRI 05/31/2016 of the thoracic spine FINDINGS: More confluent  airspace opacity in the right mid lung about the right hilum. Mild bilateral chronic interstitial prominence is noted otherwise. Heart is normal in size. The aorta is atherosclerotic without aneurysm. No effusion or pneumothorax. IMPRESSION: Right perihilar consolidation possibly as a result of atelectasis. Pneumonia is not entirely excluded. Aortic atherosclerosis. Electronically Signed   By: Ashley Royalty M.D.   On: 06/06/2016 15:04     Medical Consultants:    None.  Anti-Infectives:   Unasyn  Subjective:    Brenda [name] relates her breathing is better  Objective:    Vitals:   06/07/16 2106 06/08/16 0542 06/08/16 0847 06/08/16 0848  BP: 103/61 116/67    Pulse: 72 77    Resp: 20 19    Temp: 97.4 F (36.3 C) 97.4 F (36.3 C)    TempSrc: Oral Oral    SpO2: 98% 98% 99% 99%  Weight:      Height:        Intake/Output Summary (Last 24 hours) at 06/08/16 1142 Last data filed at 06/08/16 0950  Gross per 24 hour  Intake              630 ml  Output             2150 ml  Net            -1520 ml   Filed Weights   06/06/16 1822  Weight: 53.1 kg (117 lb 1 oz)    Exam: General exam: In no acute distress. Respiratory system: Good air movement and crackles on the right. Cardiovascular system: S1 & S2 heard, RRR. No JVD. Gastrointestinal system: Abdomen is nondistended, soft and nontender.  Extremities: No pedal edema. Skin: No rashes, lesions or ulcers Psychiatry: Judgement and insight appear normal. Mood & affect appropriate.    Data Reviewed:    Labs: Basic Metabolic Panel:  Recent Labs Lab 06/06/16 1249 06/07/16 0547  NA 128* 136  K 4.3 4.1  CL 91* 97*  CO2 30 32  GLUCOSE 279* 120*  BUN 18 13  CREATININE 0.74 0.68  CALCIUM 8.7* 8.7*   GFR Estimated Creatinine Clearance: 58.8 mL/min (by C-G formula based on SCr of 0.68 mg/dL). Liver Function Tests:  Recent Labs Lab 06/07/16 0547  AST 15  ALT 15  ALKPHOS 149*  BILITOT 0.7  PROT 5.7*  ALBUMIN 2.8*   No results for input(s): LIPASE, AMYLASE in the last 168 hours. No results for input(s): AMMONIA in the last 168 hours. Coagulation profile  Recent Labs Lab 06/03/16 0543  INR 0.96    CBC:  Recent Labs Lab 06/03/16 0543 06/06/16 1249 06/07/16 0547  WBC 6.1 10.6* 7.7  NEUTROABS 4.5 9.4*  --   HGB 11.0* 10.9* 11.1*  HCT 34.1* 33.7* 34.1*  MCV 90.2 88.5 90.9  PLT 230 209 239   Cardiac Enzymes: No results for input(s): CKTOTAL, CKMB, CKMBINDEX, TROPONINI in the last 168 hours. BNP (last 3 results) No  results for input(s): PROBNP in the last 8760 hours. CBG:  Recent Labs Lab 06/03/16 1150 06/06/16 1251 06/06/16 1653 06/06/16 2320 06/07/16 0822  GLUCAP 222* 284* 235* 136* 116*   D-Dimer: No results for input(s): DDIMER in the last 72 hours. Hgb A1c: No results for input(s): HGBA1C in the last 72 hours. Lipid Profile: No results for input(s): CHOL, HDL, LDLCALC, TRIG, CHOLHDL, LDLDIRECT in the last 72 hours. Thyroid function studies: No results for input(s): TSH, T4TOTAL, T3FREE, THYROIDAB in the last 72 hours.  Invalid input(s): FREET3  Anemia work up: No results for input(s): VITAMINB12, FOLATE, FERRITIN, TIBC, IRON, RETICCTPCT in the last 72 hours. Sepsis Labs:  Recent Labs Lab 06/03/16 0543 06/06/16 1249 06/07/16 0547  WBC 6.1 10.6* 7.7   Microbiology Recent Results (from the past 240 hour(s))  Culture, blood (routine x 2) Call MD if unable to obtain prior to antibiotics being given     Status: None (Preliminary result)   Collection Time: 06/06/16  6:56 PM  Result Value Ref Range Status   Specimen Description BLOOD RIGHT ANTECUBITAL  Final   Special Requests BOTTLES DRAWN AEROBIC AND ANAEROBIC 10CC  Final   Culture   Final    NO GROWTH 2 DAYS Performed at [facility]    Report Status PENDING  Incomplete  Culture, blood (routine x 2) Call MD if unable to obtain prior to antibiotics being given     Status: None (Preliminary result)   Collection Time: 06/06/16  7:18 PM  Result Value Ref Range Status   Specimen Description BLOOD LEFT ARM  Final   Special Requests BOTTLES DRAWN AEROBIC ONLY 10CC  Final   Culture   Final    NO GROWTH 2 DAYS Performed at [facility]    Report Status PENDING  Incomplete     Medications:   . ampicillin-sulbactam (UNASYN) IV  1.5 g Intravenous Q6H  . bisoprolol  2.5 mg Oral Daily  . buPROPion  300 mg Oral Daily  . calcitonin ([place])  1 spray Alternating Nares Daily  . feeding supplement (GLUCERNA SHAKE)  237  mL Oral TID BM  . fluticasone  2 spray Each Nare Daily  . fluticasone furoate-vilanterol  1 puff Inhalation Daily  . guaiFENesin  1,200 mg Oral BID  . heparin  5,000 Units Subcutaneous Q8H  . insulin aspart  0-9 Units Subcutaneous TID WC  . ketorolac  15 mg Intravenous Q6H  . loratadine  10 mg Oral Daily  . mouth rinse  15 mL Mouth Rinse BID  . pantoprazole  40 mg Oral Daily  . predniSONE  20 mg Oral Q breakfast  . rosuvastatin  20 mg Oral Daily  . tiotropium  18 mcg Inhalation Daily   Continuous Infusions:  Time spent: 15 min   LOS: 2 days   Charlynne Cousins  [facility] Pager [phone]  *Please refer to [email or web address], password [email or web address] to get updated schedule on who will round on this patient, as hospitalists switch teams weekly. If 7PM-7AM, please contact night-coverage at [email or web address], password [email or web address] for any overnight needs.  06/08/2016, 11:42 AM

## 2016-06-09 DIAGNOSIS — C01 Malignant neoplasm of base of tongue: Secondary | ICD-10-CM

## 2016-06-09 MED ORDER — AMOXICILLIN-POT CLAVULANATE 400-57 MG/5ML PO SUSR
875.0000 mg | Freq: Two times a day (BID) | ORAL | Status: DC
  Administered 2016-06-09 – 2016-06-10 (×3): 875 mg via ORAL
  Filled 2016-06-09 (×4): qty 10.9

## 2016-06-09 MED ORDER — OXYCODONE-ACETAMINOPHEN 5-325 MG PO TABS
1.0000 | ORAL_TABLET | Freq: Four times a day (QID) | ORAL | Status: DC | PRN
  Administered 2016-06-09 (×2): 2 via ORAL
  Filled 2016-06-09 (×2): qty 2

## 2016-06-09 MED ORDER — CEFAZOLIN SODIUM-DEXTROSE 2-4 GM/100ML-% IV SOLN
2.0000 g | INTRAVENOUS | Status: AC
  Administered 2016-06-10: 2 g via INTRAVENOUS
  Filled 2016-06-09: qty 100

## 2016-06-09 NOTE — Progress Notes (Signed)
[facility] PROGRESS NOTE    Progress Note  Brenda [name]  JSE:[id number] DOB: 07/06/1950 DOA: 06/06/2016 PCP: [name], MD     Brief Narrative:   Brenda [name] is an 66 y.o. 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 Scheduled for kyphoplasty and comes in today for dyspnea with desaturations  Assessment/Plan:   Acute respiratory failure with hypoxia due to Recurrent aspiration pneumonia/Dysphagia with aspiration: Change abx to oral augmentin in am Cultures, strep pneumo Ag and influenza PCR negative Change steroids to orals.  DM (diabetes mellitus), type 2, uncontrolled ([place]) Continue hold Lantus, she is nothing by mouth continue CBGs every 4 hours, continue sliding scale insulin.  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  Closed compression fracture of thoracic vertebra ([name]) Continue hold Plavix for kyphoplasty on 06/10/2016. Increase narcotics.  Tongue cancer with history of dysphagia, - radiation 2010 We'll do pured thickened liquids diet for now. Last speech therapy to evaluate her in the morning for further recommendations  DVT prophylaxis: lovenox Family Communication:none Disposition Plan/Barrier to D/C: home in 2 days Code Status:     Code Status Orders        Start     Ordered   06/06/16 1844  Full code  Continuous     06/06/16 1844    Code Status History    Date Active Date Inactive Code Status Order ID Comments User Context   05/30/2016  7:59 PM 06/03/2016  6:56 PM Full Code [id number]  Etta Quill, DO ED   05/20/2016 12:52 PM 05/24/2016  3:13 PM Full Code [id number]  Erick Colace, NP Inpatient   05/10/2016  2:57 AM  05/10/2016  2:57 AM Full Code [id number]  Edwin Dada, MD Inpatient   05/10/2016  2:57 AM 05/14/2016  6:45 PM Full Code [id number]  Edwin Dada, MD Inpatient   04/03/2016 10:18 AM 04/03/2016 10:23 AM Full Code [id number]  Marijean Heath, NP Inpatient   12/22/2015  2:59 PM 12/22/2015  6:05 PM Full Code [id number]  Rosetta Posner, MD Inpatient   12/01/2015 10:29 AM 12/01/2015  6:50 PM Full Code [id number]  Elam Dutch, MD Inpatient   10/05/2015  8:48 PM 10/07/2015  1:44 PM Full Code [id number]  Ivor Costa, MD Inpatient   05/28/2015  3:29 PM 06/02/2015  3:41 PM Full Code [id number]  Eugenie Filler, MD Inpatient   01/17/2015  6:52 PM 01/20/2015  4:17 PM Full Code [id number]  Oswald Hillock, MD Inpatient   08/14/2014  8:28 PM 08/18/2014  9:39 PM Full Code [id number]  Melton Alar, PA-C Inpatient   08/14/2014  8:07 PM 08/14/2014  8:28 PM Full Code [id number]  Alric Quan, MD ED   02/05/2013  4:50 PM 02/09/2013  2:52 PM Full Code [id number]  Gabriel Earing, PA-C Inpatient        IV Access:    Peripheral IV   Procedures and diagnostic studies:   No results found.   Medical Consultants:    None.  Anti-Infectives:   Unasyn  Subjective:    Brenda [name] relates her breathing is better, She continues to have pain with movement.  Objective:    Vitals:   06/08/16 2139 06/09/16 0600 06/09/16 0830 06/09/16 0831  BP: Marland Kitchen)  150/74 (!) 107/59    Pulse: 71 70    Resp: 18 18    Temp: 97.7 F (36.5 C) 98.4 F (36.9 C)    TempSrc: Oral Oral    SpO2: 100% 100% 100% 100%  Weight:      Height:        Intake/Output Summary (Last 24 hours) at 06/09/16 1200 Last data filed at 06/09/16 0600  Gross per 24 hour  Intake              440 ml  Output                0 ml  Net              440 ml   Filed Weights   06/06/16 1822  Weight: 53.1 kg (117 lb 1 oz)    Exam: General exam: In no acute distress. Respiratory system: Good air movement and crackles on the  right. Cardiovascular system: S1 & S2 heard, RRR. No JVD. Gastrointestinal system: Abdomen is nondistended, soft and nontender.  Extremities: No pedal edema. Skin: No rashes, lesions or ulcers Psychiatry: Judgement and insight appear normal. Mood & affect appropriate.    Data Reviewed:    Labs: Basic Metabolic Panel:  Recent Labs Lab 06/06/16 1249 06/07/16 0547  NA 128* 136  K 4.3 4.1  CL 91* 97*  CO2 30 32  GLUCOSE 279* 120*  BUN 18 13  CREATININE 0.74 0.68  CALCIUM 8.7* 8.7*   GFR Estimated Creatinine Clearance: 58.8 mL/min (by C-G formula based on SCr of 0.68 mg/dL). Liver Function Tests:  Recent Labs Lab 06/07/16 0547  AST 15  ALT 15  ALKPHOS 149*  BILITOT 0.7  PROT 5.7*  ALBUMIN 2.8*   No results for input(s): LIPASE, AMYLASE in the last 168 hours. No results for input(s): AMMONIA in the last 168 hours. Coagulation profile  Recent Labs Lab 06/03/16 0543  INR 0.96    CBC:  Recent Labs Lab 06/03/16 0543 06/06/16 1249 06/07/16 0547  WBC 6.1 10.6* 7.7  NEUTROABS 4.5 9.4*  --   HGB 11.0* 10.9* 11.1*  HCT 34.1* 33.7* 34.1*  MCV 90.2 88.5 90.9  PLT 230 209 239   Cardiac Enzymes: No results for input(s): CKTOTAL, CKMB, CKMBINDEX, TROPONINI in the last 168 hours. BNP (last 3 results) No results for input(s): PROBNP in the last 8760 hours. CBG:  Recent Labs Lab 06/03/16 1150 06/06/16 1251 06/06/16 1653 06/06/16 2320 06/07/16 0822  GLUCAP 222* 284* 235* 136* 116*   D-Dimer: No results for input(s): DDIMER in the last 72 hours. Hgb A1c: No results for input(s): HGBA1C in the last 72 hours. Lipid Profile: No results for input(s): CHOL, HDL, LDLCALC, TRIG, CHOLHDL, LDLDIRECT in the last 72 hours. Thyroid function studies: No results for input(s): TSH, T4TOTAL, T3FREE, THYROIDAB in the last 72 hours.  Invalid input(s): FREET3 Anemia work up: No results for input(s): VITAMINB12, FOLATE, FERRITIN, TIBC, IRON, RETICCTPCT in the last 72  hours. Sepsis Labs:  Recent Labs Lab 06/03/16 0543 06/06/16 1249 06/07/16 0547  WBC 6.1 10.6* 7.7   Microbiology Recent Results (from the past 240 hour(s))  Culture, blood (routine x 2) Call MD if unable to obtain prior to antibiotics being given     Status: None (Preliminary result)   Collection Time: 06/06/16  6:56 PM  Result Value Ref Range Status   Specimen Description BLOOD RIGHT ANTECUBITAL  Final   Special Requests BOTTLES DRAWN AEROBIC AND ANAEROBIC 10CC  Final  Culture   Final    NO GROWTH 2 DAYS Performed at [facility]    Report Status PENDING  Incomplete  Culture, blood (routine x 2) Call MD if unable to obtain prior to antibiotics being given     Status: None (Preliminary result)   Collection Time: 06/06/16  7:18 PM  Result Value Ref Range Status   Specimen Description BLOOD LEFT ARM  Final   Special Requests BOTTLES DRAWN AEROBIC ONLY 10CC  Final   Culture   Final    NO GROWTH 2 DAYS Performed at [facility]    Report Status PENDING  Incomplete     Medications:   . amoxicillin-clavulanate  875 mg Oral Q12H  . bisoprolol  2.5 mg Oral Daily  . buPROPion  300 mg Oral Daily  . calcitonin (salmon)  1 spray Alternating Nares Daily  . [START ON 06/10/2016]  ceFAZolin (ANCEF) IV  2 g Intravenous On Call  . feeding supplement (GLUCERNA SHAKE)  237 mL Oral TID BM  . fluticasone  2 spray Each Nare Daily  . fluticasone furoate-vilanterol  1 puff Inhalation Daily  . guaiFENesin  1,200 mg Oral BID  . heparin  5,000 Units Subcutaneous Q8H  . insulin aspart  0-9 Units Subcutaneous TID WC  . ketorolac  15 mg Intravenous Q6H  . loratadine  10 mg Oral Daily  . mouth rinse  15 mL Mouth Rinse BID  . pantoprazole  40 mg Oral Daily  . predniSONE  20 mg Oral Q breakfast  . rosuvastatin  20 mg Oral Daily  . tiotropium  18 mcg Inhalation Daily   Continuous Infusions:  Time spent: 15 min   LOS: 3 days   Charlynne Cousins  [facility] Pager  [phone]  *Please refer to [email or web address], password [email or web address] to get updated schedule on who will round on this patient, as hospitalists switch teams weekly. If 7PM-7AM, please contact night-coverage at [email or web address], password [email or web address] for any overnight needs.  06/09/2016, 12:00 PM

## 2016-06-09 NOTE — Progress Notes (Signed)
Supervising Physician: Sandi Mariscal  Patient Status:  [facility] - In-pt  Chief Complaint: T7 and T12 compression fractures  Subjective: Pt seen today in prep for procedure tomorrow. Appreciate medical and pulmonary services assistance. Per Pulmonary note, pt is cleared for procedure tomorrow. She is feeling much better than day of admission and really feels 'back to baseline' Denies SOB at rest, currently on 3L O2. Still having back pain as previously described. Plavix has remained on hold.  Allergies: Tape; Isosorbide; and Nicoderm [nicotine]  Medications:  Current Facility-Administered Medications:  .  acetaminophen (TYLENOL) tablet 650 mg, 650 mg, Oral, Q6H PRN, Dawn T Langeland, MD .  albuterol (PROVENTIL) (2.5 MG/3ML) 0.083% nebulizer solution 2.5 mg, 2.5 mg, Nebulization, Q6H PRN, Maren Reamer, MD .  amoxicillin-clavulanate (AUGMENTIN) 600-42.9 MG/5ML suspension 875 mg, 875 mg, Oral, Q12H, Charlynne Cousins, MD .  bisacodyl (DULCOLAX) EC tablet 5 mg, 5 mg, Oral, Daily PRN, Maren Reamer, MD .  bisoprolol (ZEBETA) tablet 2.5 mg, 2.5 mg, Oral, Daily, Maren Reamer, MD, 2.5 mg at 06/09/16 1016 .  buPROPion (WELLBUTRIN XL) 24 hr tablet 300 mg, 300 mg, Oral, Daily, Maren Reamer, MD, 300 mg at 06/09/16 1016 .  calcitonin (salmon) (MIACALCIN/FORTICAL) nasal spray 1 spray, 1 spray, Alternating Nares, Daily, Erick Colace, NP, 1 spray at 06/09/16 1030 .  cyclobenzaprine (FLEXERIL) tablet 5-10 mg, 5-10 mg, Oral, TID PRN, Maren Reamer, MD .  feeding supplement (GLUCERNA SHAKE) (GLUCERNA SHAKE) liquid 237 mL, 237 mL, Oral, TID BM, Charlynne Cousins, MD, 237 mL at 06/09/16 1016 .  fluticasone (FLONASE) 50 MCG/ACT nasal spray 2 spray, 2 spray, Each Nare, Daily, Maren Reamer, MD, 2 spray at 06/09/16 1017 .  fluticasone furoate-vilanterol (BREO ELLIPTA) 100-25 MCG/INH 1 puff, 1 puff, Inhalation, Daily, Maren Reamer, MD, 1 puff at 06/09/16 0830 .  guaiFENesin  (MUCINEX) 12 hr tablet 1,200 mg, 1,200 mg, Oral, BID, Maren Reamer, MD, 1,200 mg at 06/09/16 1016 .  heparin injection 5,000 Units, 5,000 Units, Subcutaneous, Q8H, Maren Reamer, MD, 5,000 Units at 06/09/16 [phone]  ibuprofen (ADVIL,MOTRIN) tablet 400 mg, 400 mg, Oral, Q6H PRN, Maren Reamer, MD .  insulin aspart (novoLOG) injection 0-9 Units, 0-9 Units, Subcutaneous, TID WC, Maren Reamer, MD, 1 Units at 06/09/16 0809 .  ipratropium-albuterol (DUONEB) 0.5-2.5 (3) MG/3ML nebulizer solution 3 mL, 3 mL, Nebulization, BID PRN, Maren Reamer, MD .  ketorolac (TORADOL) 15 MG/ML injection 15 mg, 15 mg, Intravenous, Q6H, Erick Colace, NP, 15 mg at 06/09/16 6811 .  loratadine (CLARITIN) tablet 10 mg, 10 mg, Oral, Daily, Maren Reamer, MD, 10 mg at 06/09/16 1016 .  magnesium citrate solution 1 Bottle, 1 Bottle, Oral, Once PRN, Maren Reamer, MD .  MEDLINE mouth rinse, 15 mL, Mouth Rinse, BID, Orson Eva, MD, 15 mL at 06/09/16 1018 .  morphine 2 MG/ML injection 2 mg, 2 mg, Intravenous, Q4H PRN, Lily Kocher, MD, 2 mg at 06/07/16 1113 .  nitroGLYCERIN (NITROLINGUAL) 0.4 MG/SPRAY spray 1 spray, 1 spray, Sublingual, Q5 min PRN, Maren Reamer, MD .  oxyCODONE-acetaminophen (PERCOCET/ROXICET) 5-325 MG per tablet 1-2 tablet, 1-2 tablet, Oral, Q6H PRN, Charlynne Cousins, MD .  pantoprazole (PROTONIX) EC tablet 40 mg, 40 mg, Oral, Daily, Maren Reamer, MD, 40 mg at 06/09/16 1016 .  polyethylene glycol (MIRALAX / GLYCOLAX) packet 17 g, 17 g, Oral, Daily PRN, Maren Reamer, MD .  predniSONE (DELTASONE) tablet  20 mg, 20 mg, Oral, Q breakfast, Maren Reamer, MD, 20 mg at 06/09/16 0809 .  RESOURCE THICKENUP CLEAR, , Oral, PRN, Maren Reamer, MD .  rosuvastatin (CRESTOR) tablet 20 mg, 20 mg, Oral, Daily, Maren Reamer, MD, 20 mg at 06/09/16 1016 .  senna-docusate (Senokot-S) tablet 1 tablet, 1 tablet, Oral, QHS PRN, Maren Reamer, MD .  tiotropium (SPIRIVA) inhalation capsule  18 mcg, 18 mcg, Inhalation, Daily, Maren Reamer, MD, 18 mcg at 06/09/16 0829 .  traMADol (ULTRAM) tablet 50 mg, 50 mg, Oral, Q6H PRN, Maren Reamer, MD    Vital Signs: BP (!) 107/59 (BP Location: Right Arm)   Pulse 70   Temp 98.4 F (36.9 C) (Oral)   Resp 18   Ht '5\' 5"'$  (1.651 m)   Wt 117 lb 1 oz (53.1 kg)   LMP  (LMP Unknown)   SpO2 100%   BMI 19.48 kg/m   Physical Exam  Constitutional: She is oriented to person, place, and time. She appears well-developed and well-nourished. No distress.  HENT:  Head: Normocephalic.  Mouth/Throat: Oropharynx is clear and moist.  Neck: Normal range of motion. No tracheal deviation present.  Cardiovascular: Normal rate, regular rhythm and normal heart sounds.   Pulmonary/Chest: Effort normal. No respiratory distress.  Scattered rhonchi  Neurological: She is alert and oriented to person, place, and time.  Skin: Skin is warm and dry.  Psychiatric: She has a normal mood and affect. Judgment normal.     Imaging: Dg Chest Port 1 View  Result Date: 06/06/2016 CLINICAL DATA:  Mid thoracic pain secondary fractures.  Dyspnea. EXAM: PORTABLE CHEST 1 VIEW COMPARISON:  CXR 05/30/2016, MRI 05/31/2016 of the thoracic spine FINDINGS: More confluent airspace opacity in the right mid lung about the right hilum. Mild bilateral chronic interstitial prominence is noted otherwise. Heart is normal in size. The aorta is atherosclerotic without aneurysm. No effusion or pneumothorax. IMPRESSION: Right perihilar consolidation possibly as a result of atelectasis. Pneumonia is not entirely excluded. Aortic atherosclerosis. Electronically Signed   By: Ashley Royalty M.D.   On: 06/06/2016 15:04    Labs:  CBC:  Recent Labs  06/01/16 0507 06/03/16 0543 06/06/16 1249 06/07/16 0547  WBC 7.3 6.1 10.6* 7.7  HGB 10.4* 11.0* 10.9* 11.1*  HCT 33.6* 34.1* 33.7* 34.1*  PLT 245 230 209 239    COAGS:  Recent Labs  10/02/15 0951 10/05/15 1420 06/03/16 0543  INR   --  0.92 0.96  APTT 35  --   --     BMP:  Recent Labs  05/30/16 1423 05/30/16 1527 05/31/16 0550 06/06/16 1249 06/07/16 0547  NA 131*  --  134* 128* 136  K 5.4* 5.2* 4.6 4.3 4.1  CL 88*  --  93* 91* 97*  CO2 36*  --  34* 30 32  GLUCOSE 485*  --  197* 279* 120*  BUN 19  --  22* 18 13  CALCIUM 9.4  --  8.7* 8.7* 8.7*  CREATININE 0.86  --  0.65 0.74 0.68  GFRNONAA >60  --  >60 >60 >60  GFRAA >60  --  >60 >60 >60    LIVER FUNCTION TESTS:  Recent Labs  04/03/16 1136 05/10/16 0323 05/20/16 1319 06/07/16 0547  BILITOT 0.7 1.7* 0.7 0.7  AST 16 20 14* 15  ALT 19 16 12* 15  ALKPHOS 115 175* 161* 149*  PROT 6.8 5.0* 5.2* 5.7*  ALBUMIN 3.7 2.3* 2.2* 2.8*    Assessment and  Plan: T7 and T12 compression fractures. Chronic resp issues/aspiration?-improved over the past few days and pt now stable for procedure per Pulmonary. (Appreciate the assistance) Plan to proceed tomorrow. Had pt sign new consent after review of the procedure.   Electronically Signed: Ascencion Dike 06/09/2016, 11:38 AM   I spent a total of 15 Minutes at the the patient's bedside AND on the patient's hospital floor or unit, greater than 50% of which was counseling/coordinating care for kyphoplasty

## 2016-06-09 NOTE — Progress Notes (Signed)
Pt was sitting bedside having a cup of coffee when Elim arrived. Re: HCPOA, she said someone else, from the hospital, Romie Minus?) was taking care of it. I could not ascertain who. She said she did not have any needs at this time. Please page if assistance is needed. Chaplain Ernest Haber, M.Div.   06/09/16 1800  Clinical Encounter Type  Visited With Patient and family together

## 2016-06-09 NOTE — Progress Notes (Signed)
Pt's CBG's 0800 - 142, 1200 - 274, 1700 - 329.  Blood sugars did not cross over in CHL from meter.  Appropriate dosage of sliding scale insulin given. Brenda [name]

## 2016-06-10 ENCOUNTER — Inpatient Hospital Stay (HOSPITAL_COMMUNITY): Payer: Medicare Other

## 2016-06-10 DIAGNOSIS — J441 Chronic obstructive pulmonary disease with (acute) exacerbation: Secondary | ICD-10-CM

## 2016-06-10 HISTORY — PX: IR GENERIC HISTORICAL: IMG1180011

## 2016-06-10 LAB — GLUCOSE, CAPILLARY
GLUCOSE-CAPILLARY: 136 mg/dL — AB (ref 65–99)
GLUCOSE-CAPILLARY: 158 mg/dL — AB (ref 65–99)
GLUCOSE-CAPILLARY: 315 mg/dL — AB (ref 65–99)
GLUCOSE-CAPILLARY: 265 mg/dL — AB (ref 65–99)
GLUCOSE-CAPILLARY: 142 mg/dL — AB (ref 65–99)
GLUCOSE-CAPILLARY: 329 mg/dL — AB (ref 65–99)
GLUCOSE-CAPILLARY: 232 mg/dL — AB (ref 65–99)
Glucose-Capillary: 119 mg/dL — ABNORMAL HIGH (ref 65–99)
Glucose-Capillary: 152 mg/dL — ABNORMAL HIGH (ref 65–99)
Glucose-Capillary: 292 mg/dL — ABNORMAL HIGH (ref 65–99)
Glucose-Capillary: 142 mg/dL — ABNORMAL HIGH (ref 65–99)
Glucose-Capillary: 274 mg/dL — ABNORMAL HIGH (ref 65–99)
Glucose-Capillary: 272 mg/dL — ABNORMAL HIGH (ref 65–99)
Glucose-Capillary: 147 mg/dL — ABNORMAL HIGH (ref 65–99)
Glucose-Capillary: 155 mg/dL — ABNORMAL HIGH (ref 65–99)

## 2016-06-10 LAB — LEGIONELLA PNEUMOPHILA SEROGP 1 UR AG: L. pneumophila Serogp 1 Ur Ag: NEGATIVE

## 2016-06-10 MED ORDER — LIDOCAINE-EPINEPHRINE (PF) 2 %-1:200000 IJ SOLN
INTRAMUSCULAR | Status: AC
  Filled 2016-06-10: qty 20

## 2016-06-10 MED ORDER — MIDAZOLAM HCL 2 MG/2ML IJ SOLN
INTRAMUSCULAR | Status: AC
  Filled 2016-06-10: qty 8

## 2016-06-10 MED ORDER — HEPARIN SODIUM (PORCINE) 5000 UNIT/ML IJ SOLN
5000.0000 [IU] | Freq: Three times a day (TID) | INTRAMUSCULAR | Status: DC
  Administered 2016-06-10 – 2016-06-11 (×3): 5000 [IU] via SUBCUTANEOUS
  Filled 2016-06-10 (×3): qty 1

## 2016-06-10 MED ORDER — MIDAZOLAM HCL 2 MG/2ML IJ SOLN
INTRAMUSCULAR | Status: AC | PRN
  Administered 2016-06-10 (×6): 1 mg via INTRAVENOUS

## 2016-06-10 MED ORDER — AMOXICILLIN-POT CLAVULANATE 250-62.5 MG/5ML PO SUSR
875.0000 mg | Freq: Two times a day (BID) | ORAL | Status: DC
  Administered 2016-06-10 – 2016-06-11 (×2): 875 mg via ORAL
  Filled 2016-06-10 (×3): qty 17.5

## 2016-06-10 MED ORDER — LIDOCAINE HCL (PF) 1 % IJ SOLN
INTRAMUSCULAR | Status: AC
  Filled 2016-06-10: qty 30

## 2016-06-10 MED ORDER — IOPAMIDOL (ISOVUE-300) INJECTION 61%
INTRAVENOUS | Status: AC
  Administered 2016-06-10: 10 mL
  Filled 2016-06-10: qty 50

## 2016-06-10 MED ORDER — LIDOCAINE HCL (PF) 1 % IJ SOLN
INTRAMUSCULAR | Status: AC | PRN
  Administered 2016-06-10 (×2): 5 mL

## 2016-06-10 MED ORDER — IOPAMIDOL (ISOVUE-300) INJECTION 61%
10.0000 mL | Freq: Once | INTRAVENOUS | Status: AC | PRN
  Administered 2016-06-10: 10 mL

## 2016-06-10 MED ORDER — FENTANYL CITRATE (PF) 100 MCG/2ML IJ SOLN
INTRAMUSCULAR | Status: AC | PRN
  Administered 2016-06-10 (×2): 25 ug via INTRAVENOUS
  Administered 2016-06-10: 50 ug via INTRAVENOUS
  Administered 2016-06-10: 25 ug via INTRAVENOUS

## 2016-06-10 MED ORDER — PREDNISONE 5 MG PO TABS
10.0000 mg | ORAL_TABLET | Freq: Every day | ORAL | Status: DC
  Administered 2016-06-11: 10 mg via ORAL
  Filled 2016-06-10: qty 2

## 2016-06-10 MED ORDER — FENTANYL CITRATE (PF) 100 MCG/2ML IJ SOLN
INTRAMUSCULAR | Status: AC
  Filled 2016-06-10: qty 4

## 2016-06-10 NOTE — Procedures (Signed)
Technically successful cement augmentation of the T7 and T12 vertebral bodies. Patient is to lie flat for 3 hours. EBL: Minimal No immediate post procedural complications.  Ronny Bacon, MD Pager #: [phone]

## 2016-06-10 NOTE — Progress Notes (Addendum)
I attempted to follow-up with patient to discuss completion of advance directives including HCPOA and possibly MOST form, however, patient was at procedure.  Patient has my card, and we discussed at last encounter that she will call if she would like to meet again with member of PMT to complete MOST or to have another family meeting. If she is admitted and family calls, member of PMT will follow up.  If not, I am hopeful that she can be assisted with this if so desired as an outpatient.  Please let us know if we can be of further assistance in the care of this patient.  NO CHARGE NOTE  Micheline Rough, MD [place] Team [phone]

## 2016-06-10 NOTE — Care Management Note (Signed)
Case Management Note  Patient Details  Name: Brenda [name] MRN: [id number] Date of Birth: 13-Jan-1951  Subjective/Objective:65 y/o f admitted w/Asp PNA. Readmit-PNA. From home. Noted palliative-family not interested in hospice. PT cons-await recc.                    Action/Plan:d/c plan home.   Expected Discharge Date:                  Expected Discharge Plan:  Home/Self Care  In-House Referral:     Discharge planning Services  CM Consult  Post Acute Care Choice:    Choice offered to:     DME Arranged:    DME Agency:     HH Arranged:    HH Agency:     Status of Service:  In process, will continue to follow  If discussed at Long Length of Stay Meetings, dates discussed:    Additional Comments:  Dessa Phi, RN 06/10/2016, 1:06 PM

## 2016-06-10 NOTE — Progress Notes (Signed)
   Name: Brenda [name] MRN: [id number] DOB: 04/22/1951    ADMISSION DATE:  06/06/2016 CONSULTATION DATE:  26/5  REFERRING MD :  watts  CHIEF COMPLAINT:  Chronic respiratory failure. Recurrent aspiration   BRIEF PATIENT DESCRIPTION:  This is a 42 yowf with  documented h/o recurrent aspiration pneumonia in setting of known dysphagia and resultant episodes of acute on chronic hypoxic respiratory failure. Her most recent admit was on 12/28 when she had increase in back pain which eventually uncovered that she had new T7 compression fracture. She was discharged to home w/ plan to f/u w/ IR for possible Kyphoplasty. Was admitted on 1/4 w/ plan for Kyphoplasty however pt still w/ sig shortness of breath and airspace disease on CXR so procedure was postponed. PCCM asked to see on 1/5 to assist w/ pulmonary management in effort to "tune her up" for Kyphoplasty.   SUBJECTIVE:  C/o  congested sounding cough No dyspnea or chest pain  VITAL SIGNS: Temp:  [98.6 F (37 C)-98.8 F (37.1 C)] 98.6 F (37 C) (01/08 0529) Pulse Rate:  [67-78] 70 (01/08 0815) Resp:  [17-20] 17 (01/08 0815) BP: (116-177)/(56-77) 116/56 (01/08 0529) SpO2:  [98 %-100 %] 98 % (01/08 0815) 02  3lpm NP   PHYSICAL EXAMINATION: General:  Chronically ill appearing white female. Resting in bed. Not in acute distress.  Neuro:  Awake, oriented. No focal def  HEENT:  NCAT. No JVD. MMM Cardiovascular:  RRR w/out MRG Lungs:  Kyphotic. Scattered junky sounding rhonchi on insp / exp/ clear only partially with cough  Abdomen:  Soft not tender No OM.  Musculoskeletal:  Still with Marked back pain w/ positional movement extremity strength is equal  Skin:  Warm and dry    Recent Labs Lab 06/06/16 1249 06/07/16 0547  NA 128* 136  K 4.3 4.1  CL 91* 97*  CO2 30 32  BUN 18 13  CREATININE 0.74 0.68  GLUCOSE 279* 120*    Recent Labs Lab 06/06/16 1249 06/07/16 0547  HGB 10.9* 11.1*  HCT 33.7* 34.1*  WBC 10.6* 7.7  PLT 209  239    Recent Labs Lab 06/06/16 1249 06/07/16 0547  WBC 10.6* 7.7      No results found.     ASSESSMENT / PLAN:  Chronic Respiratory Failure COPD  GOLD IV by pfts 11/2014  AECOPD Recurrent aspiration PNA Atelectasis  Dysphagia  Thoracic spine compression fracture at T 7 Mild anemia  DM2 Spiculated pulmonary nodule    Discussion  This is a 66 year old female w/ chronic hypoxic respiratory failure in the setting of severe COPD and recurrent aspiration PNA. Now acutely complicated by T7 fracture which is  affecting her cough mechanics likely leading to mucous plugging and atelectasis.     Plan/rec Cont supplemental O2 Cont BD (scheduled)  can keep on the '20mg'$  dosing for now. (eventually resume taper from there) Cont empiric abx  Kyphoplasty planned for today Can follow outpatient with Dr. Lake Bells  [facility] available as needed  Rigoberto Noel MD

## 2016-06-10 NOTE — Progress Notes (Signed)
PT Cancellation Note  Patient Details Name: Brenda [name] MRN: [id number] DOB: 05-05-1951   Cancelled Treatment:    Reason Eval/Treat Not Completed: Patient at procedure or test/unavailableWill follow  In AM for PT eval.   Marcelino Freestone PT [phone]  06/10/2016, 3:51 PM

## 2016-06-10 NOTE — Progress Notes (Addendum)
MEDICATION-RELATED CONSULT NOTE   IR Procedure Consult - Anticoagulant/Antiplatelet PTA/Inpatient Med List Review by Pharmacist    Procedure: kyphoplasty    Completed: 06/10/2016 at 8329  Post-Procedural bleeding risk per IR MD assessment:  Standard  Antithrombotic medications on inpatient or PTA profile prior to procedure:   SQ heparin, IV ketorolac, PRN PO ibuprofen. Aspirin and Plavix PTA, but held on admission per MD (last doses 05/30/2016)    Recommended restart time per IR Post-Procedure Guidelines:   -prophylactic dose UFH can be resumed Day 0 at least 6 hours after procedure or at next standard dose interval -NSAIDS can be resumed day 0 in evening -low dose ASA unlikely to be held for IR procedure (note, has been held per MD discretion) -clopidogrel can be resumed next AM   Plan:     -Will defer to MD regarding restart of Aspirin and Plavix (note hx of CAD s/p PCI) -Next SQ heparin dose to be at 2330, then resume usual q8h dosing times (0600/1400/2200) thereafter -NSAIDS can be resumed evening of procedure, so no change needed.    Lindell Spar, PharmD, BCPS Pager: [phone] 06/10/2016 7:57 PM

## 2016-06-10 NOTE — Progress Notes (Signed)
[facility] PROGRESS NOTE    Progress Note  JAZMEN LINDENBAUM  RKY:[id number] DOB: [birth year] DOA: 06/06/2016 PCP: [name], MD     Brief Narrative:   JORDANNE ELSBURY is an 66 y.o. 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 Scheduled for kyphoplasty and comes in today for dyspnea with desaturations  Assessment/Plan:   Acute respiratory failure with hypoxia due to Recurrent aspiration pneumonia/Dysphagia with aspiration: Continue oral Augmentin she has remained afebrile she relates her breathing is improved. Change steroids to orals.  DM (diabetes mellitus), type 2, uncontrolled ([name]) Continue Lantus plus sliding scale continue CBGs every 4 hours, continue sliding scale insulin.  Severe COPD (chronic obstructive pulmonary disease) oxygen dependant: Continue taper down steroids as tolerated.  Closed compression fracture of thoracic vertebra ([name]) Continue hold Plavix for kyphoplasty on 06/10/2016. Cont. narcotics.  Tongue cancer with history of dysphagia, - radiation 2010 We'll do pured thickened liquids diet for now. Last speech therapy to evaluate her in the morning for further recommendations  DVT prophylaxis: lovenox Family Communication:none Disposition Plan/Barrier to D/C: home in 1days Code Status:     Code Status Orders        Start     Ordered   06/06/16 1844  Full code  Continuous     06/06/16 1844    Code Status History    Date Active Date Inactive Code Status Order ID Comments User Context   05/30/2016  7:59 PM 06/03/2016  6:56 PM Full Code [id number]  Etta Quill, DO ED   05/20/2016 12:52 PM 05/24/2016  3:13 PM Full Code [id number]  Erick Colace, NP Inpatient   05/10/2016  2:57 AM 05/10/2016  2:57 AM Full Code [id number]  Edwin Dada, MD Inpatient   05/10/2016  2:57 AM 05/14/2016  6:45 PM Full Code [id number]  Edwin Dada, MD Inpatient   04/03/2016 10:18 AM 04/03/2016 10:23 AM Full Code [id number]  Marijean Heath, NP Inpatient   12/22/2015  2:59 PM 12/22/2015  6:05 PM Full Code [id number]  Rosetta Posner, MD Inpatient   12/01/2015 10:29 AM 12/01/2015  6:50 PM Full Code [id number]  Elam Dutch, MD Inpatient   10/05/2015  8:48 PM 10/07/2015  1:44 PM Full Code [id number]  Ivor Costa, MD Inpatient   05/28/2015  3:29 PM 06/02/2015  3:41 PM Full Code [id number]  Eugenie Filler, MD Inpatient   01/17/2015  6:52 PM 01/20/2015  4:17 PM Full Code [id number]  Oswald Hillock, MD Inpatient   08/14/2014  8:28 PM 08/18/2014  9:39 PM Full Code [id number]  Melton Alar, PA-C Inpatient   08/14/2014  8:07 PM 08/14/2014  8:28 PM Full Code [id number]  Alric Quan, MD ED   02/05/2013  4:50 PM 02/09/2013  2:52 PM Full Code [id number]  Gabriel Earing, PA-C Inpatient        IV Access:    Peripheral IV   Procedures and diagnostic studies:   No results found.   Medical Consultants:    None.  Anti-Infectives:   Unasyn  Subjective:    [name]  She continues to have pain with movement.  Objective:    Vitals:   06/09/16 1440 06/09/16 2121 06/10/16 0529 06/10/16 0815  BP: 137/77 (!) 177/76 (!) 116/56   Pulse: 78 71 67 70  Resp: 19 20  18 17  Temp: 98.7 F (37.1 C) 98.8 F (37.1 C) 98.6 F (37 C)   TempSrc: Oral Oral Oral   SpO2: 100% 100% 100% 98%  Weight:      Height:        Intake/Output Summary (Last 24 hours) at 06/10/16 1238 Last data filed at 06/10/16 1517  Gross per 24 hour  Intake              420 ml  Output             3301 ml  Net            -2881 ml   Filed Weights   06/06/16 1822  Weight: 53.1 kg (117 lb 1 oz)    Exam: General exam: In no acute distress. Respiratory system: Good air movement and crackles on the right. Cardiovascular system: S1 & S2 heard, RRR. No  JVD. Gastrointestinal system: Abdomen is nondistended, soft and nontender.  Extremities: No pedal edema. Skin: No rashes, lesions or ulcers Psychiatry: Judgement and insight appear normal. Mood & affect appropriate.    Data Reviewed:    Labs: Basic Metabolic Panel:  Recent Labs Lab 06/06/16 1249 06/07/16 0547  NA 128* 136  K 4.3 4.1  CL 91* 97*  CO2 30 32  GLUCOSE 279* 120*  BUN 18 13  CREATININE 0.74 0.68  CALCIUM 8.7* 8.7*   GFR Estimated Creatinine Clearance: 58.8 mL/min (by C-G formula based on SCr of 0.68 mg/dL). Liver Function Tests:  Recent Labs Lab 06/07/16 0547  AST 15  ALT 15  ALKPHOS 149*  BILITOT 0.7  PROT 5.7*  ALBUMIN 2.8*   No results for input(s): LIPASE, AMYLASE in the last 168 hours. No results for input(s): AMMONIA in the last 168 hours. Coagulation profile No results for input(s): INR, PROTIME in the last 168 hours.  CBC:  Recent Labs Lab 06/06/16 1249 06/07/16 0547  WBC 10.6* 7.7  NEUTROABS 9.4*  --   HGB 10.9* 11.1*  HCT 33.7* 34.1*  MCV 88.5 90.9  PLT 209 239   Cardiac Enzymes: No results for input(s): CKTOTAL, CKMB, CKMBINDEX, TROPONINI in the last 168 hours. BNP (last 3 results) No results for input(s): PROBNP in the last 8760 hours. CBG:  Recent Labs Lab 06/09/16 1645 06/09/16 2120 06/10/16 0531 06/10/16 0750 06/10/16 1138  GLUCAP 329* 272* 147* 155* 232*   D-Dimer: No results for input(s): DDIMER in the last 72 hours. Hgb A1c: No results for input(s): HGBA1C in the last 72 hours. Lipid Profile: No results for input(s): CHOL, HDL, LDLCALC, TRIG, CHOLHDL, LDLDIRECT in the last 72 hours. Thyroid function studies: No results for input(s): TSH, T4TOTAL, T3FREE, THYROIDAB in the last 72 hours.  Invalid input(s): FREET3 Anemia work up: No results for input(s): VITAMINB12, FOLATE, FERRITIN, TIBC, IRON, RETICCTPCT in the last 72 hours. Sepsis Labs:  Recent Labs Lab 06/06/16 1249 06/07/16 0547  WBC 10.6* 7.7    Microbiology Recent Results (from the past 240 hour(s))  Culture, blood (routine x 2) Call MD if unable to obtain prior to antibiotics being given     Status: None (Preliminary result)   Collection Time: 06/06/16  6:56 PM  Result Value Ref Range Status   Specimen Description BLOOD RIGHT ANTECUBITAL  Final   Special Requests BOTTLES DRAWN AEROBIC AND ANAEROBIC 10CC  Final   Culture   Final    NO GROWTH 3 DAYS Performed at [facility]    Report Status PENDING  Incomplete  Culture,  blood (routine x 2) Call MD if unable to obtain prior to antibiotics being given     Status: None (Preliminary result)   Collection Time: 06/06/16  7:18 PM  Result Value Ref Range Status   Specimen Description BLOOD LEFT ARM  Final   Special Requests BOTTLES DRAWN AEROBIC ONLY 10CC  Final   Culture   Final    NO GROWTH 3 DAYS Performed at [facility]    Report Status PENDING  Incomplete     Medications:   . amoxicillin-clavulanate  875 mg Oral Q12H  . bisoprolol  2.5 mg Oral Daily  . buPROPion  300 mg Oral Daily  . calcitonin (salmon)  1 spray Alternating Nares Daily  .  ceFAZolin (ANCEF) IV  2 g Intravenous On Call  . feeding supplement (GLUCERNA SHAKE)  237 mL Oral TID BM  . fluticasone  2 spray Each Nare Daily  . fluticasone furoate-vilanterol  1 puff Inhalation Daily  . guaiFENesin  1,200 mg Oral BID  . heparin  5,000 Units Subcutaneous Q8H  . insulin aspart  0-9 Units Subcutaneous TID WC  . ketorolac  15 mg Intravenous Q6H  . loratadine  10 mg Oral Daily  . mouth rinse  15 mL Mouth Rinse BID  . pantoprazole  40 mg Oral Daily  . predniSONE  20 mg Oral Q breakfast  . rosuvastatin  20 mg Oral Daily  . tiotropium  18 mcg Inhalation Daily   Continuous Infusions:  Time spent: 15 min   LOS: 4 days   Charlynne Cousins  [facility] Pager [phone]  *Please refer to [email or web address], password [email or web address] to get updated schedule on who will round on this patient, as  hospitalists switch teams weekly. If 7PM-7AM, please contact night-coverage at [email or web address], password [email or web address] for any overnight needs.  06/10/2016, 12:38 PM

## 2016-06-10 NOTE — Progress Notes (Signed)
SLP Cancellation Note  Patient Details Name: Brenda [name] MRN: [id number] DOB: [birth year]   Cancelled treatment:       Reason Eval/Treat Not Completed:  (pt npo for kyphoplasty, will continue efforts)   06/10/2016, 8:50 AM  [name], [place] [facility] SLP [phone]

## 2016-06-11 ENCOUNTER — Encounter (HOSPITAL_COMMUNITY): Payer: Self-pay | Admitting: Interventional Radiology

## 2016-06-11 LAB — CULTURE, BLOOD (ROUTINE X 2)
CULTURE: NO GROWTH
Culture: NO GROWTH

## 2016-06-11 LAB — GLUCOSE, CAPILLARY
GLUCOSE-CAPILLARY: 244 mg/dL — AB (ref 65–99)
Glucose-Capillary: 215 mg/dL — ABNORMAL HIGH (ref 65–99)
Glucose-Capillary: 295 mg/dL — ABNORMAL HIGH (ref 65–99)
Glucose-Capillary: 149 mg/dL — ABNORMAL HIGH (ref 65–99)

## 2016-06-11 MED ORDER — OXYCODONE-ACETAMINOPHEN 5-325 MG PO TABS: 1.0000 | ORAL_TABLET | Freq: Four times a day (QID) | ORAL | 0 refills | Status: DC | PRN

## 2016-06-11 MED ORDER — AMOXICILLIN-POT CLAVULANATE 250-62.5 MG/5ML PO SUSR: 875.0000 mg | Freq: Two times a day (BID) | ORAL | 0 refills | Status: DC

## 2016-06-11 MED ORDER — CLOPIDOGREL BISULFATE 75 MG PO TABS: 75.0000 mg | ORAL_TABLET | Freq: Every day | ORAL | 12 refills | Status: AC

## 2016-06-11 MED ORDER — PREDNISONE 5 MG PO TABS: 5.0000 mg | ORAL_TABLET | Freq: Every day | ORAL | 0 refills | Status: DC

## 2016-06-11 NOTE — Evaluation (Signed)
Physical Therapy Evaluation Patient Details Name: Brenda [name] MRN: [id number] DOB: [birth year] Today's Date: 06/11/2016   History of Present Illness  Brenda [name] is a 67 y.o. female significant past medical history of chronic obstructive pulmonary disease with chronic respiratory respiratory failure on home O2, coronary artery disease status post PCI, tongue cancer status post radiation therapy with complaints of dysphagia and recurrent aspiration pneumonia, peripheral vascular disease, recently discharged from the hospital after a four-day admission on 06/03/2016 for which she was treated for aspiration pneumonia.  The patient was scheduled for kyphoplasty for vertebral fractures today and presented to IR for kyphoplasty T7 & T12, it was noted sats were in low 90s, procedure was delayed until 06/10/16  Clinical Impression  Pt admitted with above diagnosis. Pt currently with functional limitations due to the deficits listed below (see PT Problem List).  Pt will benefit from skilled PT to increase their independence and safety with mobility to allow discharge to the venue listed below.   Pt did very well today with mobility, will need to continue her O2 at home, resting sats 93% on RA, ambulated on RA and sats decr to 81%, O2 replaced at 3L and sats >93%;  agree she will benefit from HHPT; will continue to follow in acute setting     Follow Up Recommendations Home health PT;Supervision - Intermittent    Equipment Recommendations  None recommended by PT    Recommendations for Other Services       Precautions / Restrictions Precautions Precautions: Fall Restrictions Weight Bearing Restrictions: No      Mobility  Bed Mobility Overal bed mobility: Needs Assistance Bed Mobility: Supine to Sit   Sidelying to sit: Supervision       General bed mobility comments: Discussed rolling to come OOB via UE to decrease stress on her back, pt without back pain today, does partial  turn  Transfers Overall transfer level: Needs assistance Equipment used: None;Rolling walker (2 wheeled) Transfers: Sit to/from Stand Sit to Stand: Min guard         General transfer comment: cues for safety, hand placement  Ambulation/Gait Ambulation/Gait assistance: Min assist;Min guard Ambulation Distance (Feet): 100 Feet (10' without AD and min assist) Assistive device: Rolling walker (2 wheeled) Gait Pattern/deviations: Step-through pattern;Trunk flexed     General Gait Details: decr step length, cues for breathing   Stairs            Wheelchair Mobility    Modified Rankin (Stroke Patients Only)       Balance Overall balance assessment: Needs assistance Sitting-balance support: No upper extremity supported Sitting balance-Leahy Scale: Good       Standing balance-Leahy Scale: Fair                               Pertinent Vitals/Pain Pain Assessment: No/denies pain    Home Living Family/patient expects to be discharged to:: Private residence Living Arrangements: Spouse/significant other;Children Available Help at Discharge: Family;Available PRN/intermittently   Home Access: Stairs to enter Entrance Stairs-Rails: Right Entrance Stairs-Number of Steps: 3 Home Layout: Two level;Able to live on main level with bedroom/bathroom Home Equipment: Bedside commode;Shower seat;Walker - 2 wheels;Cane - single point;Grab bars - tub/shower;Crutches;Other (comment);Wheelchair - manual Additional Comments: oxygen all the time since last adm per pt, prior to this using O2 at night    Prior Function Level of Independence: Independent;Independent with assistive device(s)  Hand Dominance        Extremity/Trunk Assessment   Upper Extremity Assessment Upper Extremity Assessment: Generalized weakness    Lower Extremity Assessment Lower Extremity Assessment: Generalized weakness    Cervical / Trunk Assessment Cervical / Trunk  Assessment: Kyphotic  Communication   Communication: No difficulties  Cognition Arousal/Alertness: Awake/alert Behavior During Therapy: WFL for tasks assessed/performed Overall Cognitive Status: Within Functional Limits for tasks assessed                      General Comments      Exercises     Assessment/Plan    PT Assessment Patient needs continued PT services  PT Problem List Decreased strength;Decreased activity tolerance;Decreased balance;Decreased mobility;Cardiopulmonary status limiting activity          PT Treatment Interventions Gait training;Stair training;Functional mobility training;Therapeutic activities;Balance training;Patient/family education    PT Goals (Current goals can be found in the Care Plan section)  Acute Rehab PT Goals Patient Stated Goal: get to go home soon PT Goal Formulation: With patient Time For Goal Achievement: 06/25/16 Potential to Achieve Goals: Good    Frequency Min 3X/week   Barriers to discharge        Co-evaluation               End of Session Equipment Utilized During Treatment: Gait belt Activity Tolerance: Patient tolerated treatment well Patient left: in chair;with call bell/phone within reach;with chair alarm set           Time: 1025-8527 PT Time Calculation (min) (ACUTE ONLY): 16 min   Charges:   PT Evaluation $PT Eval Low Complexity: 1 Procedure     PT G Codes:        Brenda [name] Jul 10, 2016, 11:16 AM

## 2016-06-11 NOTE — Progress Notes (Signed)
Patient discharged home with husband. Discharge instruction/prescriptiion given to patient/husband and they verbalized understanding, denies any pain/distress. Accompanied home by husband. Kin intact, no wound noted.

## 2016-06-11 NOTE — Care Management Note (Signed)
Case Management Note  Patient Details  Name: Brenda [name] MRN: [id number] Date of Birth: 03-05-1951  Subjective/Objective:  Active w/[facility] HHPT. Readmit.Recc HHRN,HHPT f36forder. [place].                 Action/Plan:d/c home w/HHC.   Expected Discharge Date:                  [place] Plan:  [place] Referral:     Discharge planning Services  CM Consult  Post Acute Care Choice:  [facility](Active w/[facility] HHPT.) Choice offered to:  Patient  DME Arranged:    DME Agency:     HH Arranged:    HH Agency:     Status of Service:  In process, will continue to follow  If discussed at Long Length of Stay Meetings, dates discussed:    Additional Comments:  MDessa Phi RN 06/11/2016, 9:31 AM

## 2016-06-11 NOTE — Care Management Important Message (Signed)
Important Message  Patient Details  Name: Brenda [name] MRN: [id number] Date of Birth: 06/01/1951   Medicare Important Message Given:  Yes    Kerin Salen 06/11/2016, 1:49 [place] Message  Patient Details  Name: Brenda [name] MRN: [id number] Date of Birth: Mar 10, 1951   Medicare Important Message Given:  Yes    Kerin Salen 06/11/2016, 1:48 PM

## 2016-06-11 NOTE — Progress Notes (Signed)
Speech Language Pathology Treatment: Dysphagia  Patient Details Name: Brenda [name] MRN: [id number] DOB: 07/29/1950 Today's Date: 06/11/2016 Time: 1950-9326 SLP Time Calculation (min) (ACUTE ONLY): 15 min  Assessment / Plan / Recommendation Clinical Impression  F/u for dysphagia - pt with reduced pain after yesterday's surgery.  Pt is well known to SLP services.  She had questions about continuing thickened liquid diet at home.  Encouraged her to cautiously resume solids (moist/soft) with attention to precautions; continue the thickened liquids for safety.  Pt has lived with chronic swallowing issues for seven years. 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  She has been following the water protocol between meals; she allows herself thin coffee in the mornings.  We reviewed the importance of balancing risk (aspiration) with the activities that bring her pleasure (coffee without thickener).  Brenda [name] has a good understanding of these issues. No further SLP f/u is necessary - our services will sign off.    HPI HPI: 66 y.o. female with medical history significant of COPD, CAD with stents, dysphagia with recurrent aspiration pneumonia.  She had been doing well with regards to dysphagia on a Dys 3 diet and nectar thick liquids (other than her AM coffee).  Until she "slacked off" (pt's verbage) in October 2017 and stopped using thicker.  Since then she has been admitted 3 previous times (today makes #5) for recurrent aspiration pneumonia of the RLL also with COPD exacerbation.  Pt reports most of her problems in esophagus not her throat per her ENT and GI MD.  Pt has undergone MBS previously and reports she drinks regular/thin coffee in am.   Pt reports back pain severe enough that she can't sit up enough to breathe or cough adequately.        SLP Plan  All goals met     Recommendations  Diet recommendations:  Nectar-thick liquid (advance solids per pt's discretion) Liquids provided via: Cup Medication Administration: Crushed with puree Compensations: Slow rate;Small sips/bites;Multiple dry swallows after each bite/sip;Clear throat intermittently;Other (Comment)                Oral Care Recommendations: Oral care prior to ice chip/H20;Oral care BID Follow up Recommendations: None Plan: All goals met       GO               Brenda [name], [place] CCC/SLP Pager [phone]  Brenda [name] 06/11/2016, 2:22 PM

## 2016-06-11 NOTE — Care Management Note (Signed)
Case Management Note  Patient Details  Name: Brenda [name] MRN: [id number] Date of Birth: Feb 12, 1951  Subjective/Objective:  HH PT ordered-[facility] rep Kim aware of d/c & orders. [place] [facility] in place-since a readmit.                  Action/Plan:d/c home w/HHC/HRI.   Expected Discharge Date:                  Expected Discharge Plan:  [place]  In-House Referral:     Discharge planning Services  CM Consult  Post Acute Care Choice:  [place] (Active w/[facility] HHPT.) Choice offered to:  Patient  DME Arranged:    DME Agency:     HH Arranged:  PT HH Agency:     Status of Service:  Completed, signed off  If discussed at [place] of Stay Meetings, dates discussed:    Additional Comments:  Dessa Phi, RN 06/11/2016, 3:53 PM

## 2016-06-11 NOTE — Discharge Summary (Addendum)
Physician Discharge Summary  Brenda [name] NLG:[id number] DOB: Dec 07, 1950 DOA: 06/06/2016  PCP: [name], MD  Admit date: 06/06/2016 Discharge date: 07/09/2016  Admitted From: home Disposition:  Home  Recommendations for Outpatient Follow-up:  1. Follow up with PCP in 1-2 weeks 2. Please obtain BMP/CBC in one week  Home Health:[facility] Equipment/Devices:none  Discharge Condition:stable CODE STATUS:full Diet recommendation: Heart Healthy   Brief/Interim Summary:  66 y.o. 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 Scheduled for kyphoplasty and comes in today for dyspnea with desaturations   Discharge Diagnoses:  Principal Problem:   Recurrent aspiration pneumonia ([place]) Active Problems:   DM (diabetes mellitus), type 2, uncontrolled ([place])   Cancer of base of tongue- radiation 2010   COPD (chronic obstructive pulmonary disease) ([name])   Dysphagia with aspiration   Non-traumatic Closed compression fracture of thoracic vertebra ([name])   Acute on chronic respiratory failure with hypoxemia ([name])   Severe protein caloric malnutrition  Acute respiratory failure with hypoxia due to Recurrent aspiration pneumonia/Dysphagia with aspiration: She was started empirically on IV vancomycin and Zosyn,Cultures were done which were negative, urine Legionella was negative Strep Pneumo  negative influenza PCR was negative. This was Descalated to Augmentin when her saturations improved now she will continue for 4 additional days an outpatient.  Physical therapy saw her they recommended home health PT.  DM (diabetes mellitus), type 2, uncontrolled ([place]) No changes made to her medication.   Severe COPD (chronic obstructive pulmonary disease) oxygen dependant: She will continue her  steroids are present outpatient.  Closed compression fracture of thoracic vertebra ([name]) Plavix was held for for kyphoplasty performed on on 06/10/2016. Resume Plavix on 06/11/2016, Cont. narcotics for pain.  Tongue cancer with history of dysphagia, - radiation 2010 We'll do pured thickened liquids diet for now.    Discharge Instructions  Discharge Instructions    Diet - low sodium heart healthy    Complete by:  As directed    Increase activity slowly    Complete by:  As directed      Allergies as of 06/11/2016      Reactions   Tape Hives, Other (See Comments)   USE PAPER TAPE ONLY- Adhesive peels off skin-makes pt. Raw.   Isosorbide Other (See Comments)   Drops BP too low   Nicoderm [nicotine] Rash   To the PATCH only, breakouts on skin      Medication List    STOP taking these medications   amoxicillin-clavulanate 875-125 MG tablet Commonly known as:  AUGMENTIN Replaced by:  amoxicillin-clavulanate 250-62.5 MG/5ML suspension   predniSONE 10 MG tablet Commonly known as:  DELTASONE     TAKE these medications   acetaminophen 325 MG tablet Commonly known as:  TYLENOL Take 2 tablets (650 mg total) by mouth every 6 (six) hours as needed. What changed:  reasons to take this   albuterol 108 (90 Base) MCG/ACT inhaler Commonly known as:  PROVENTIL HFA;VENTOLIN HFA Inhale 2 puffs into the lungs every 6 (six) hours as needed for wheezing or shortness of breath.   amoxicillin-clavulanate 250-62.5 MG/5ML suspension Commonly known as:  AUGMENTIN Take 17.5 mLs (875 mg total) by mouth every 12 (twelve) hours. Replaces:  amoxicillin-clavulanate 875-125 MG tablet   aspirin EC 81 MG tablet Take 1 tablet (81 mg total) by mouth daily.   bisoprolol 5 MG tablet Commonly  known as:  ZEBETA Take 0.5 tablets (2.5 mg total) by mouth daily.   buPROPion 300 MG 24 hr tablet Commonly known as:  WELLBUTRIN XL Take 300 mg by mouth daily.   clopidogrel 75 MG tablet Commonly known  as:  PLAVIX Take 1 tablet (75 mg total) by mouth daily. Please don't take plavix until you have kyphoplasty. May resume after the procedure, please talk your PCP.   cyclobenzaprine 5 MG tablet Commonly known as:  FLEXERIL Take 1-2 tablets (5-10 mg total) by mouth 3 (three) times daily as needed for muscle spasms.   feeding supplement (GLUCERNA SHAKE) Liqd Take 237 mLs by mouth 2 (two) times daily between meals.   fish oil-omega-3 fatty acids 1000 MG capsule Take 1 g by mouth 2 (two) times daily.   fluticasone 50 MCG/ACT nasal spray Commonly known as:  FLONASE Place 2 sprays into both nostrils daily.   fluticasone furoate-vilanterol 100-25 MCG/INH Aepb Commonly known as:  BREO ELLIPTA Inhale 1 puff into the lungs daily.   guaiFENesin 600 MG 12 hr tablet Commonly known as:  MUCINEX Take 1,200 mg by mouth 2 (two) times daily.   ibuprofen 200 MG tablet Commonly known as:  ADVIL,MOTRIN Take 400 mg by mouth every 6 (six) hours as needed (Pain).   insulin glargine 100 UNIT/ML injection Commonly known as:  LANTUS Inject 0.1 mLs (10 Units total) into the skin at bedtime. What changed:  how much to take   insulin lispro 100 UNIT/ML injection Commonly known as:  HUMALOG Inject 3 Units into the skin 3 (three) times daily with meals as needed for high blood sugar.   ipratropium-albuterol 0.5-2.5 (3) MG/3ML Soln Commonly known as:  DUONEB Take 3 mLs by nebulization every 6 (six) hours. What changed:  when to take this  reasons to take this   loratadine 10 MG tablet Commonly known as:  CLARITIN Take 1 tablet (10 mg total) by mouth daily.   nitroGLYCERIN 0.4 MG/SPRAY spray Commonly known as:  NITROLINGUAL Place 1 spray under the tongue every 5 (five) minutes as needed for chest pain.   omeprazole 20 MG capsule Commonly known as:  PRILOSEC Take 20 mg by mouth daily.   oxyCODONE-acetaminophen 5-325 MG tablet Commonly known as:  PERCOCET/ROXICET Take 1 tablet by mouth every  6 (six) hours as needed for moderate pain or severe pain.   polyethylene glycol powder powder Commonly known as:  GLYCOLAX/MIRALAX Take 17 g by mouth 2 (two) times daily. What changed:  when to take this  reasons to take this   [place] Use with thin liquids to make nectar thickened consistency   rosuvastatin 20 MG tablet Commonly known as:  CRESTOR Take 20 mg by mouth daily.   SPIRIVA RESPIMAT 2.5 MCG/ACT Aers Generic drug:  Tiotropium Bromide Monohydrate Take 2 puffs by mouth daily.   traMADol 50 MG tablet Commonly known as:  ULTRAM Take 1 tablet (50 mg total) by mouth every 6 (six) hours as needed for moderate pain or severe pain.      Follow-up Information    [facility] Follow up.   Why:  [place] [facility] Contact information: [place] [address] [phone]          Allergies  Allergen Reactions  . Tape Hives and Other (See Comments)    USE PAPER TAPE ONLY- Adhesive peels off skin-makes pt. Raw.  . Isosorbide Other (See Comments)    Drops BP too low  .  Nicoderm [Nicotine] Rash    To the PATCH only, breakouts on skin    Consultations:  IR  Puomonary and critical care   Procedures/Studies: Dg Chest 2 View  Result Date: 06/17/2016 CLINICAL DATA:  Recent hospitalization for pneumonia. EXAM: CHEST  2 VIEW COMPARISON:  06/06/2016 FINDINGS: There is persistent significant residual opacity in the right lower lobe. Some of this appears to represent scarring. Other density presumably relates to recent pneumonia. Recommend continued radiographic follow-up. Otherwise stable emphysematous lung disease. No new infiltrate, edema or pleural fluid identified. The heart size and mediastinal contours are stable. Methylmethacrylate visualized at the T7 and T12 levels. IMPRESSION: Significant residual opacity remains in the right lower lobe. Some of this presumably relates to scarring after recent  pneumonia. Recommend continued radiographic follow-up. Electronically Signed   By: Aletta Edouard M.D.   On: 06/17/2016 14:38   Ir Kypho Thoracic With Bone Biopsy  Result Date: 06/11/2016 CLINICAL DATA:  Symptomatic T7 and T12 compression fractures. Please refer to formal consultation in the epic EMR performed on 05/31/2016. EXAM: 1. FLUOROSCOPIC GUIDED KYPHOPLASTY OF THE T7 VERTEBRAL BODY 2. FLUOROSCOPIC GUIDED KYPHOPLASTY OF THE T12 VERTEBRAL BODY. COMPARISON:  Thoracic spine MRI - 05/31/2016; chest CT - 05/09/2016 MEDICATIONS: As antibiotic prophylaxis, Ancef 2 gm IV was ordered pre-procedure and administered intravenously within 1 hour of incision. ANESTHESIA/SEDATION: Moderate (conscious) sedation was employed during this procedure. A total of Versed 6 mg and Fentanyl 125 mcg was administered intravenously. Moderate Sedation Time: 79 minutes. The patient's level of consciousness and vital signs were monitored continuously by radiology nursing throughout the procedure under my direct supervision. FLUOROSCOPY TIME:  22 min, 42 seconds (8546 mGy) COMPLICATIONS: None immediate. TECHNIQUE: The procedure, risks (including but not limited to bleeding, infection, organ damage), benefits, and alternatives were explained to the patient. Questions regarding the procedure were encouraged and answered. The patient understands and consents to the procedure. The patient was placed prone on the fluoroscopic table. The skin overlying the upper thoracic region was then prepped and draped in the usual sterile fashion. Maximal barrier sterile technique was utilized including caps, mask, sterile gowns, sterile gloves, sterile drape, hand hygiene and skin antiseptic. Intravenous Fentanyl and Versed were administered as conscious sedation during continuous cardiorespiratory monitoring by the radiology RN. The left pedicle at T7 was then infiltrated with 1% lidocaine followed by the advancement of a Kyphon trocar needle through  the left pedicle into the posterior one-third of the vertebral body. Subsequently, the osteo drill was advanced to the anterior third of the vertebral body. The osteo drill was retracted. Through the working cannula, a Kyphon inflatable bone tamp 15 x 3 was advanced and positioned with the distal marker approximately 5 mm from the anterior aspect of the cortex. Appropriate positioning was confirmed on the AP projection. At this time, the balloon was expanded using contrast via a Kyphon inflation syringe device via micro tubing. Attention was now paid towards the cement augmentation of the T12 vertebral body. The left pedicle at T12 was then infiltrated with 1% lidocaine followed by the advancement of a Kyphon trocar needle through the left pedicle into the posterior one-third of the vertebral body. Subsequently, the osteo drill was advanced to the anterior third of the vertebral body. The osteo drill was retracted. Through the working cannula, a Kyphon inflatable bone tamp 15 x 3 was advanced and positioned with the distal marker approximately 5 mm from the anterior aspect of the cortex. Appropriate positioning was confirmed on the AP projection.  At this time, the balloon was expanded using contrast via a Kyphon inflation syringe device via micro tubing. In similar fashion, the right T12 pedicle was infiltrated with 1% lidocaine followed by the advancement of a second Kyphon trocar needle through the right pedicle into the posterior third of the vertebral body. Again, a bone biopsy was obtained at this location. Subsequently, the osteo drill was coaxially advanced to the anterior right third. The osteo drill was exchanged for a Kyphon inflatable bone tamp 15 x 3, advanced to the 5 mm of the anterior aspect of the cortex. The balloon was then expanded using contrast as above. Inflations were continued until there was near apposition with the superior end plate. At this time, methylmethacrylate mixture was  reconstituted in the Kyphon bone mixing device system. This was then loaded into the delivery mechanism, attached to Kyphon bone fillers. Beginning at the T7 vertebral body level, the balloon was deflated and removed followed by the instillation of methylmethacrylate mixture with excellent filling in the AP and lateral projections. The identical procedure was repeated at the T12 level. No extravasation was noted in the disk spaces or posteriorly into the spinal canal. No epidural venous contamination was seen. The working cannulae and the bone filler were then retrieved and removed. Hemostasis was achieved with manual compression. The patient tolerated the procedure well without immediate postprocedural complication. IMPRESSION: 1. Technically successful T7 vertebral body augmentation using balloon kyphoplasty via unilateral transpedicular approach 2. Technically successful T12 vertebral body augmentation using balloon kyphoplasty via bilateral transpedicular approach. 3. Per CMS PQRS reporting requirements (PQRS Measure 24): Given the patient's age of greater than 51 and the fracture site (hip, distal radius, or spine), the patient should be tested for osteoporosis using DXA, and the appropriate treatment considered based on the DXA results. Electronically Signed   By: Sandi Mariscal M.D.   On: 06/11/2016 08:19   Ir Kypho Ea Addl Level Thoracic Or Lumbar  Result Date: 06/11/2016 CLINICAL DATA:  Symptomatic T7 and T12 compression fractures. Please refer to formal consultation in the epic EMR performed on 05/31/2016. EXAM: 1. FLUOROSCOPIC GUIDED KYPHOPLASTY OF THE T7 VERTEBRAL BODY 2. FLUOROSCOPIC GUIDED KYPHOPLASTY OF THE T12 VERTEBRAL BODY. COMPARISON:  Thoracic spine MRI - 05/31/2016; chest CT - 05/09/2016 MEDICATIONS: As antibiotic prophylaxis, Ancef 2 gm IV was ordered pre-procedure and administered intravenously within 1 hour of incision. ANESTHESIA/SEDATION: Moderate (conscious) sedation was employed during  this procedure. A total of Versed 6 mg and Fentanyl 125 mcg was administered intravenously. Moderate Sedation Time: 79 minutes. The patient's level of consciousness and vital signs were monitored continuously by radiology nursing throughout the procedure under my direct supervision. FLUOROSCOPY TIME:  22 min, 42 seconds (4709 mGy) COMPLICATIONS: None immediate. TECHNIQUE: The procedure, risks (including but not limited to bleeding, infection, organ damage), benefits, and alternatives were explained to the patient. Questions regarding the procedure were encouraged and answered. The patient understands and consents to the procedure. The patient was placed prone on the fluoroscopic table. The skin overlying the upper thoracic region was then prepped and draped in the usual sterile fashion. Maximal barrier sterile technique was utilized including caps, mask, sterile gowns, sterile gloves, sterile drape, hand hygiene and skin antiseptic. Intravenous Fentanyl and Versed were administered as conscious sedation during continuous cardiorespiratory monitoring by the radiology RN. The left pedicle at T7 was then infiltrated with 1% lidocaine followed by the advancement of a Kyphon trocar needle through the left pedicle into the posterior one-third of the vertebral body.  Subsequently, the osteo drill was advanced to the anterior third of the vertebral body. The osteo drill was retracted. Through the working cannula, a Kyphon inflatable bone tamp 15 x 3 was advanced and positioned with the distal marker approximately 5 mm from the anterior aspect of the cortex. Appropriate positioning was confirmed on the AP projection. At this time, the balloon was expanded using contrast via a Kyphon inflation syringe device via micro tubing. Attention was now paid towards the cement augmentation of the T12 vertebral body. The left pedicle at T12 was then infiltrated with 1% lidocaine followed by the advancement of a Kyphon trocar needle  through the left pedicle into the posterior one-third of the vertebral body. Subsequently, the osteo drill was advanced to the anterior third of the vertebral body. The osteo drill was retracted. Through the working cannula, a Kyphon inflatable bone tamp 15 x 3 was advanced and positioned with the distal marker approximately 5 mm from the anterior aspect of the cortex. Appropriate positioning was confirmed on the AP projection. At this time, the balloon was expanded using contrast via a Kyphon inflation syringe device via micro tubing. In similar fashion, the right T12 pedicle was infiltrated with 1% lidocaine followed by the advancement of a second Kyphon trocar needle through the right pedicle into the posterior third of the vertebral body. Again, a bone biopsy was obtained at this location. Subsequently, the osteo drill was coaxially advanced to the anterior right third. The osteo drill was exchanged for a Kyphon inflatable bone tamp 15 x 3, advanced to the 5 mm of the anterior aspect of the cortex. The balloon was then expanded using contrast as above. Inflations were continued until there was near apposition with the superior end plate. At this time, methylmethacrylate mixture was reconstituted in the Kyphon bone mixing device system. This was then loaded into the delivery mechanism, attached to Kyphon bone fillers. Beginning at the T7 vertebral body level, the balloon was deflated and removed followed by the instillation of methylmethacrylate mixture with excellent filling in the AP and lateral projections. The identical procedure was repeated at the T12 level. No extravasation was noted in the disk spaces or posteriorly into the spinal canal. No epidural venous contamination was seen. The working cannulae and the bone filler were then retrieved and removed. Hemostasis was achieved with manual compression. The patient tolerated the procedure well without immediate postprocedural complication. IMPRESSION: 1.  Technically successful T7 vertebral body augmentation using balloon kyphoplasty via unilateral transpedicular approach 2. Technically successful T12 vertebral body augmentation using balloon kyphoplasty via bilateral transpedicular approach. 3. Per CMS PQRS reporting requirements (PQRS Measure 24): Given the patient's age of greater than 72 and the fracture site (hip, distal radius, or spine), the patient should be tested for osteoporosis using DXA, and the appropriate treatment considered based on the DXA results. Electronically Signed   By: Sandi Mariscal M.D.   On: 06/11/2016 08:19   Ir Radiologist Eval & Mgmt  Result Date: 06/27/2016 Please refer to "Notes" to see consult details.   Subjective: She relates her pain is improved.  Discharge Exam: Vitals:   06/11/16 0609 06/11/16 1500  BP: (!) 146/94 (!) 151/85  Pulse: 69   Resp: 16 16  Temp: 98.1 F (36.7 C) 98.1 F (36.7 C)   Vitals:   06/10/16 2121 06/11/16 0609 06/11/16 1056 06/11/16 1500  BP: (!) 153/82 (!) 146/94  (!) 151/85  Pulse: 76 69    Resp: '16 16  16  '$ Temp: 98.7  F (37.1 C) 98.1 F (36.7 C)  98.1 F (36.7 C)  TempSrc: Oral Oral  Oral  SpO2: 100% 100% 94% 100%  Weight:      Height:        General: Pt is alert, awake, not in acute distress Cardiovascular: RRR, S1/S2 +, no rubs, no gallops Respiratory: CTA bilaterally, no wheezing, no rhonchi Abdominal: Soft, NT, ND, bowel sounds + Extremities: no edema, no cyanosis    The results of significant diagnostics from this hospitalization (including imaging, microbiology, ancillary and laboratory) are listed below for reference.     Microbiology: No results found for this or any previous visit (from the past 240 hour(s)).   Labs: BNP (last 3 results)  Recent Labs  05/09/16 2102  BNP 836.6*   Basic Metabolic Panel: No results for input(s): NA, K, CL, CO2, GLUCOSE, BUN, CREATININE, CALCIUM, MG, PHOS in the last 168 hours. Liver Function Tests: No results  for input(s): AST, ALT, ALKPHOS, BILITOT, PROT, ALBUMIN in the last 168 hours. No results for input(s): LIPASE, AMYLASE in the last 168 hours. No results for input(s): AMMONIA in the last 168 hours. CBC: No results for input(s): WBC, NEUTROABS, HGB, HCT, MCV, PLT in the last 168 hours. Cardiac Enzymes: No results for input(s): CKTOTAL, CKMB, CKMBINDEX, TROPONINI in the last 168 hours. BNP: Invalid input(s): POCBNP CBG: No results for input(s): GLUCAP in the last 168 hours. D-Dimer No results for input(s): DDIMER in the last 72 hours. Hgb A1c No results for input(s): HGBA1C in the last 72 hours. Lipid Profile No results for input(s): CHOL, HDL, LDLCALC, TRIG, CHOLHDL, LDLDIRECT in the last 72 hours. Thyroid function studies No results for input(s): TSH, T4TOTAL, T3FREE, THYROIDAB in the last 72 hours.  Invalid input(s): FREET3 Anemia work up No results for input(s): VITAMINB12, FOLATE, FERRITIN, TIBC, IRON, RETICCTPCT in the last 72 hours. Urinalysis    Component Value Date/Time   COLORURINE AMBER (A) 05/28/2015 1529   APPEARANCEUR CLOUDY (A) 05/28/2015 1529   LABSPEC 1.021 05/28/2015 1529   PHURINE 5.5 05/28/2015 1529   GLUCOSEU >1000 (A) 05/28/2015 1529   HGBUR NEGATIVE 05/28/2015 1529   BILIRUBINUR SMALL (A) 05/28/2015 1529   KETONESUR NEGATIVE 05/28/2015 1529   PROTEINUR 30 (A) 05/28/2015 1529   UROBILINOGEN 1.0 01/17/2015 1627   NITRITE NEGATIVE 05/28/2015 1529   LEUKOCYTESUR NEGATIVE 05/28/2015 1529   Sepsis Labs Invalid input(s): PROCALCITONIN,  WBC,  LACTICIDVEN Microbiology No results found for this or any previous visit (from the past 240 hour(s)).   Time coordinating discharge: Over 30 minutes  SIGNED:   Charlynne Cousins, MD  [facility] 07/09/2016, 2:24 PM Pager   If 7PM-7AM, please contact night-coverage [email or web address] Password [email or web address]

## 2016-06-11 NOTE — Progress Notes (Signed)
Referring Physician(s): Vann,J  Supervising Physician: Jacqulynn Cadet  Patient Status:  [facility] - In-pt  Chief Complaint:  Back pain, T7 and T12 fractures  Subjective:  Status post KP of T7 and T12 yesterday. Pain has improved from prior back pain. Only having 5/10 pain at incision sites when sitting up for long periods of time. Denies any new pains, weakness, paraesthesias, numbness, or tingling.    Allergies: Tape; Isosorbide; and Nicoderm [nicotine]  Medications: Prior to Admission medications   Medication Sig Start Date End Date Taking? Authorizing Provider  acetaminophen (TYLENOL) 325 MG tablet Take 2 tablets (650 mg total) by mouth every 6 (six) hours as needed. Patient taking differently: Take 650 mg by mouth every 6 (six) hours as needed for mild pain.  05/01/16  Yes [name], PA-C  bisoprolol (ZEBETA) 5 MG tablet Take 0.5 tablets (2.5 mg total) by mouth daily. 05/15/16  Yes Orson Eva, MD  buPROPion (WELLBUTRIN XL) 300 MG 24 hr tablet Take 300 mg by mouth daily.  09/19/15  Yes Historical Provider, MD  cyclobenzaprine (FLEXERIL) 5 MG tablet Take 1-2 tablets (5-10 mg total) by mouth 3 (three) times daily as needed for muscle spasms. 05/09/16  Yes Naiping Ephriam Jenkins, MD  feeding supplement, GLUCERNA SHAKE, (GLUCERNA SHAKE) LIQD Take 237 mLs by mouth 2 (two) times daily between meals. 04/09/16  Yes Donita Brooks, NP  fish oil-omega-3 fatty acids 1000 MG capsule Take 1 g by mouth 2 (two) times daily.    Yes Historical Provider, MD  fluticasone (FLONASE) 50 MCG/ACT nasal spray Place 2 sprays into both nostrils daily. 06/02/15  Yes Debbe Odea, MD  fluticasone furoate-vilanterol (BREO ELLIPTA) 100-25 MCG/INH AEPB Inhale 1 puff into the lungs daily. 09/13/15  Yes Tammy S Parrett, NP  guaiFENesin (MUCINEX) 600 MG 12 hr tablet Take 1,200 mg by mouth 2 (two) times daily.   Yes Historical Provider, MD  ibuprofen (ADVIL,MOTRIN) 200 MG tablet Take 400 mg by mouth every 6 (six) hours as  needed (Pain).   Yes Historical Provider, MD  insulin glargine (LANTUS) 100 UNIT/ML injection Inject 0.1 mLs (10 Units total) into the skin at bedtime. Patient taking differently: Inject 10-20 Units into the skin at bedtime.  05/14/16  Yes Orson Eva, MD  insulin lispro (HUMALOG) 100 UNIT/ML injection Inject 3 Units into the skin 3 (three) times daily with meals as needed for high blood sugar.    Yes Historical Provider, MD  ipratropium-albuterol (DUONEB) 0.5-2.5 (3) MG/3ML SOLN Take 3 mLs by nebulization every 6 (six) hours. Patient taking differently: Take 3 mLs by nebulization 2 (two) times daily as needed (for wheezingt or shortness of breath).  06/02/15  Yes Debbe Odea, MD  loratadine (CLARITIN) 10 MG tablet Take 1 tablet (10 mg total) by mouth daily. 06/02/15  Yes Debbe Odea, MD  Maltodextrin-Xanthan Gum ([place]) POWD Use with thin liquids to make nectar thickened consistency 05/14/16  Yes Orson Eva, MD  nitroGLYCERIN (NITROLINGUAL) 0.4 MG/SPRAY spray Place 1 spray under the tongue every 5 (five) minutes as needed for chest pain. 01/20/15  Yes Belkys A Regalado, MD  omeprazole (PRILOSEC) 20 MG capsule Take 20 mg by mouth daily.    Yes Historical Provider, MD  oxyCODONE-acetaminophen (PERCOCET/ROXICET) 5-325 MG tablet Take 1 tablet by mouth every 6 (six) hours as needed for moderate pain or severe pain. 06/03/16  Yes Dron Tanna Furry, MD  polyethylene glycol powder (GLYCOLAX/MIRALAX) powder Take 17 g by mouth 2 (two) times daily. Patient taking  differently: Take 17 g by mouth 2 (two) times daily as needed for moderate constipation.  05/01/16  Yes [name], PA-C  predniSONE (DELTASONE) 10 MG tablet Take 4 tabs  daily with food x 4 days, then 3 tabs daily x 4 days, then 2 tabs daily x 4 days, then 1 tab daily x4 days then stop. #40 05/24/16  Yes Erick Colace, NP  rosuvastatin (CRESTOR) 20 MG tablet Take 20 mg by mouth daily.   Yes Historical Provider, MD  SPIRIVA  RESPIMAT 2.5 MCG/ACT AERS Take 2 puffs by mouth daily.  10/24/15  Yes Historical Provider, MD  traMADol (ULTRAM) 50 MG tablet Take 1 tablet (50 mg total) by mouth every 6 (six) hours as needed for moderate pain or severe pain. 05/09/16  Yes Naiping Ephriam Jenkins, MD  albuterol (PROVENTIL HFA;VENTOLIN HFA) 108 (90 BASE) MCG/ACT inhaler Inhale 2 puffs into the lungs every 6 (six) hours as needed for wheezing or shortness of breath. 01/20/15   Belkys A Regalado, MD  aspirin EC 81 MG tablet Take 1 tablet (81 mg total) by mouth daily. 10/07/15   Thurnell Lose, MD  clopidogrel (PLAVIX) 75 MG tablet Take 1 tablet (75 mg total) by mouth daily. Please don't take plavix until you have kyphoplasty. May resume after the procedure, please talk your PCP. 06/10/16   Dron Tanna Furry, MD     Vital Signs: BP (!) 146/94 (BP Location: Left Arm)   Pulse 69   Temp 98.1 F (36.7 C) (Oral)   Resp 16   Ht '5\' 5"'$  (1.651 m)   Wt 117 lb 1 oz (53.1 kg)   LMP  (LMP Unknown)   SpO2 94%   BMI 19.48 kg/m   Physical Exam Elderly female in no acute distress. Alert and oriented x 3 with appropriate skin color. Gross ROM intact. Incision sites are clean, dry, and intact with no redness or drainage noted at T7 and T12   Imaging: Ir Kypho Thoracic With Bone Biopsy  Result Date: 06/11/2016 CLINICAL DATA:  Symptomatic T7 and T12 compression fractures. Please refer to formal consultation in the epic EMR performed on 05/31/2016. EXAM: 1. FLUOROSCOPIC GUIDED KYPHOPLASTY OF THE T7 VERTEBRAL BODY 2. FLUOROSCOPIC GUIDED KYPHOPLASTY OF THE T12 VERTEBRAL BODY. COMPARISON:  Thoracic spine MRI - 05/31/2016; chest CT - 05/09/2016 MEDICATIONS: As antibiotic prophylaxis, Ancef 2 gm IV was ordered pre-procedure and administered intravenously within 1 hour of incision. ANESTHESIA/SEDATION: Moderate (conscious) sedation was employed during this procedure. A total of Versed 6 mg and Fentanyl 125 mcg was administered intravenously. Moderate Sedation Time:  79 minutes. The patient's level of consciousness and vital signs were monitored continuously by radiology nursing throughout the procedure under my direct supervision. FLUOROSCOPY TIME:  22 min, 42 seconds (8119 mGy) COMPLICATIONS: None immediate. TECHNIQUE: The procedure, risks (including but not limited to bleeding, infection, organ damage), benefits, and alternatives were explained to the patient. Questions regarding the procedure were encouraged and answered. The patient understands and consents to the procedure. The patient was placed prone on the fluoroscopic table. The skin overlying the upper thoracic region was then prepped and draped in the usual sterile fashion. Maximal barrier sterile technique was utilized including caps, mask, sterile gowns, sterile gloves, sterile drape, hand hygiene and skin antiseptic. Intravenous Fentanyl and Versed were administered as conscious sedation during continuous cardiorespiratory monitoring by the radiology RN. The left pedicle at T7 was then infiltrated with 1% lidocaine followed by the advancement of a Kyphon trocar needle through the  left pedicle into the posterior one-third of the vertebral body. Subsequently, the osteo drill was advanced to the anterior third of the vertebral body. The osteo drill was retracted. Through the working cannula, a Kyphon inflatable bone tamp 15 x 3 was advanced and positioned with the distal marker approximately 5 mm from the anterior aspect of the cortex. Appropriate positioning was confirmed on the AP projection. At this time, the balloon was expanded using contrast via a Kyphon inflation syringe device via micro tubing. Attention was now paid towards the cement augmentation of the T12 vertebral body. The left pedicle at T12 was then infiltrated with 1% lidocaine followed by the advancement of a Kyphon trocar needle through the left pedicle into the posterior one-third of the vertebral body. Subsequently, the osteo drill was advanced  to the anterior third of the vertebral body. The osteo drill was retracted. Through the working cannula, a Kyphon inflatable bone tamp 15 x 3 was advanced and positioned with the distal marker approximately 5 mm from the anterior aspect of the cortex. Appropriate positioning was confirmed on the AP projection. At this time, the balloon was expanded using contrast via a Kyphon inflation syringe device via micro tubing. In similar fashion, the right T12 pedicle was infiltrated with 1% lidocaine followed by the advancement of a second Kyphon trocar needle through the right pedicle into the posterior third of the vertebral body. Again, a bone biopsy was obtained at this location. Subsequently, the osteo drill was coaxially advanced to the anterior right third. The osteo drill was exchanged for a Kyphon inflatable bone tamp 15 x 3, advanced to the 5 mm of the anterior aspect of the cortex. The balloon was then expanded using contrast as above. Inflations were continued until there was near apposition with the superior end plate. At this time, methylmethacrylate mixture was reconstituted in the Kyphon bone mixing device system. This was then loaded into the delivery mechanism, attached to Kyphon bone fillers. Beginning at the T7 vertebral body level, the balloon was deflated and removed followed by the instillation of methylmethacrylate mixture with excellent filling in the AP and lateral projections. The identical procedure was repeated at the T12 level. No extravasation was noted in the disk spaces or posteriorly into the spinal canal. No epidural venous contamination was seen. The working cannulae and the bone filler were then retrieved and removed. Hemostasis was achieved with manual compression. The patient tolerated the procedure well without immediate postprocedural complication. IMPRESSION: 1. Technically successful T7 vertebral body augmentation using balloon kyphoplasty via unilateral transpedicular approach 2.  Technically successful T12 vertebral body augmentation using balloon kyphoplasty via bilateral transpedicular approach. 3. Per CMS PQRS reporting requirements (PQRS Measure 24): Given the patient's age of greater than 39 and the fracture site (hip, distal radius, or spine), the patient should be tested for osteoporosis using DXA, and the appropriate treatment considered based on the DXA results. Electronically Signed   By: Sandi Mariscal M.D.   On: 06/11/2016 08:19   Ir Kypho Ea Addl Level Thoracic Or Lumbar  Result Date: 06/11/2016 CLINICAL DATA:  Symptomatic T7 and T12 compression fractures. Please refer to formal consultation in the epic EMR performed on 05/31/2016. EXAM: 1. FLUOROSCOPIC GUIDED KYPHOPLASTY OF THE T7 VERTEBRAL BODY 2. FLUOROSCOPIC GUIDED KYPHOPLASTY OF THE T12 VERTEBRAL BODY. COMPARISON:  Thoracic spine MRI - 05/31/2016; chest CT - 05/09/2016 MEDICATIONS: As antibiotic prophylaxis, Ancef 2 gm IV was ordered pre-procedure and administered intravenously within 1 hour of incision. ANESTHESIA/SEDATION: Moderate (conscious) sedation was  employed during this procedure. A total of Versed 6 mg and Fentanyl 125 mcg was administered intravenously. Moderate Sedation Time: 79 minutes. The patient's level of consciousness and vital signs were monitored continuously by radiology nursing throughout the procedure under my direct supervision. FLUOROSCOPY TIME:  22 min, 42 seconds (1696 mGy) COMPLICATIONS: None immediate. TECHNIQUE: The procedure, risks (including but not limited to bleeding, infection, organ damage), benefits, and alternatives were explained to the patient. Questions regarding the procedure were encouraged and answered. The patient understands and consents to the procedure. The patient was placed prone on the fluoroscopic table. The skin overlying the upper thoracic region was then prepped and draped in the usual sterile fashion. Maximal barrier sterile technique was utilized including caps, mask,  sterile gowns, sterile gloves, sterile drape, hand hygiene and skin antiseptic. Intravenous Fentanyl and Versed were administered as conscious sedation during continuous cardiorespiratory monitoring by the radiology RN. The left pedicle at T7 was then infiltrated with 1% lidocaine followed by the advancement of a Kyphon trocar needle through the left pedicle into the posterior one-third of the vertebral body. Subsequently, the osteo drill was advanced to the anterior third of the vertebral body. The osteo drill was retracted. Through the working cannula, a Kyphon inflatable bone tamp 15 x 3 was advanced and positioned with the distal marker approximately 5 mm from the anterior aspect of the cortex. Appropriate positioning was confirmed on the AP projection. At this time, the balloon was expanded using contrast via a Kyphon inflation syringe device via micro tubing. Attention was now paid towards the cement augmentation of the T12 vertebral body. The left pedicle at T12 was then infiltrated with 1% lidocaine followed by the advancement of a Kyphon trocar needle through the left pedicle into the posterior one-third of the vertebral body. Subsequently, the osteo drill was advanced to the anterior third of the vertebral body. The osteo drill was retracted. Through the working cannula, a Kyphon inflatable bone tamp 15 x 3 was advanced and positioned with the distal marker approximately 5 mm from the anterior aspect of the cortex. Appropriate positioning was confirmed on the AP projection. At this time, the balloon was expanded using contrast via a Kyphon inflation syringe device via micro tubing. In similar fashion, the right T12 pedicle was infiltrated with 1% lidocaine followed by the advancement of a second Kyphon trocar needle through the right pedicle into the posterior third of the vertebral body. Again, a bone biopsy was obtained at this location. Subsequently, the osteo drill was coaxially advanced to the  anterior right third. The osteo drill was exchanged for a Kyphon inflatable bone tamp 15 x 3, advanced to the 5 mm of the anterior aspect of the cortex. The balloon was then expanded using contrast as above. Inflations were continued until there was near apposition with the superior end plate. At this time, methylmethacrylate mixture was reconstituted in the Kyphon bone mixing device system. This was then loaded into the delivery mechanism, attached to Kyphon bone fillers. Beginning at the T7 vertebral body level, the balloon was deflated and removed followed by the instillation of methylmethacrylate mixture with excellent filling in the AP and lateral projections. The identical procedure was repeated at the T12 level. No extravasation was noted in the disk spaces or posteriorly into the spinal canal. No epidural venous contamination was seen. The working cannulae and the bone filler were then retrieved and removed. Hemostasis was achieved with manual compression. The patient tolerated the procedure well without immediate postprocedural complication.  IMPRESSION: 1. Technically successful T7 vertebral body augmentation using balloon kyphoplasty via unilateral transpedicular approach 2. Technically successful T12 vertebral body augmentation using balloon kyphoplasty via bilateral transpedicular approach. 3. Per CMS PQRS reporting requirements (PQRS Measure 24): Given the patient's age of greater than 77 and the fracture site (hip, distal radius, or spine), the patient should be tested for osteoporosis using DXA, and the appropriate treatment considered based on the DXA results. Electronically Signed   By: Sandi Mariscal M.D.   On: 06/11/2016 08:19    Labs:  CBC:  Recent Labs  06/01/16 0507 06/03/16 0543 06/06/16 1249 06/07/16 0547  WBC 7.3 6.1 10.6* 7.7  HGB 10.4* 11.0* 10.9* 11.1*  HCT 33.6* 34.1* 33.7* 34.1*  PLT 245 230 209 239    COAGS:  Recent Labs  10/02/15 0951 10/05/15 1420 06/03/16 0543    INR  --  0.92 0.96  APTT 35  --   --     BMP:  Recent Labs  05/30/16 1423 05/30/16 1527 05/31/16 0550 06/06/16 1249 06/07/16 0547  NA 131*  --  134* 128* 136  K 5.4* 5.2* 4.6 4.3 4.1  CL 88*  --  93* 91* 97*  CO2 36*  --  34* 30 32  GLUCOSE 485*  --  197* 279* 120*  BUN 19  --  22* 18 13  CALCIUM 9.4  --  8.7* 8.7* 8.7*  CREATININE 0.86  --  0.65 0.74 0.68  GFRNONAA >60  --  >60 >60 >60  GFRAA >60  --  >60 >60 >60    LIVER FUNCTION TESTS:  Recent Labs  04/03/16 1136 05/10/16 0323 05/20/16 1319 06/07/16 0547  BILITOT 0.7 1.7* 0.7 0.7  AST 16 20 14* 15  ALT 19 16 12* 15  ALKPHOS 115 175* 161* 149*  PROT 6.8 5.0* 5.2* 5.7*  ALBUMIN 3.7 2.3* 2.2* 2.8*    Assessment and Plan:  S/P KP for T7 and T12 for compression fractures 06/10/16. Patient is tolerating an upright position and food. Incision sites show no sign of infection. Patient is free to discharge from the radiology service. Monitor for signs of new pain in different location as she is at risk for future fractures.   Electronically Signed: D. Rowe Robert 06/11/2016, 12:54 PM   I spent a total of 15 Minutes at the the patient's bedside AND on the patient's hospital floor or unit, greater than 50% of which was counseling/coordinating care for incision sites with monitoring signs of new pain in different location.   Patient ID: Brenda [name], female   DOB: 1950-12-01, 66 y.o.   MRN: [id number]

## 2016-06-14 ENCOUNTER — Other Ambulatory Visit (HOSPITAL_COMMUNITY): Payer: Self-pay | Admitting: Interventional Radiology

## 2016-06-14 DIAGNOSIS — S22000A Wedge compression fracture of unspecified thoracic vertebra, initial encounter for closed fracture: Secondary | ICD-10-CM

## 2016-06-17 ENCOUNTER — Ambulatory Visit (INDEPENDENT_AMBULATORY_CARE_PROVIDER_SITE_OTHER)
Admission: RE | Admit: 2016-06-17 | Discharge: 2016-06-17 | Disposition: A | Discharge status: Home / Self Care | Payer: Medicare Other | Source: Ambulatory Visit | Attending: Acute Care | Admitting: Acute Care

## 2016-06-17 ENCOUNTER — Ambulatory Visit (INDEPENDENT_AMBULATORY_CARE_PROVIDER_SITE_OTHER): Payer: Medicare Other | Admitting: Acute Care

## 2016-06-17 ENCOUNTER — Encounter: Payer: Self-pay | Admitting: Acute Care

## 2016-06-17 ENCOUNTER — Encounter (INDEPENDENT_AMBULATORY_CARE_PROVIDER_SITE_OTHER): Payer: Self-pay | Admitting: *Deleted

## 2016-06-17 ENCOUNTER — Other Ambulatory Visit: Payer: Self-pay | Admitting: Acute Care

## 2016-06-17 DIAGNOSIS — E43 Unspecified severe protein-calorie malnutrition: Secondary | ICD-10-CM

## 2016-06-17 DIAGNOSIS — R1310 Dysphagia, unspecified: Secondary | ICD-10-CM

## 2016-06-17 DIAGNOSIS — J690 Pneumonitis due to inhalation of food and vomit: Secondary | ICD-10-CM

## 2016-06-17 DIAGNOSIS — J9611 Chronic respiratory failure with hypoxia: Secondary | ICD-10-CM | POA: Diagnosis not present

## 2016-06-17 DIAGNOSIS — J189 Pneumonia, unspecified organism: Secondary | ICD-10-CM

## 2016-06-17 NOTE — Assessment & Plan Note (Signed)
Boost 1-2 times daily as meal supplement

## 2016-06-17 NOTE — Assessment & Plan Note (Signed)
Continue oxygen a 2 L [place]

## 2016-06-17 NOTE — Progress Notes (Signed)
Reviewed, discussed with Eric Form.  Agree with this plan of care

## 2016-06-17 NOTE — Progress Notes (Signed)
History of Present Illness Brenda [name] is a 66 y.o. female former smoker ( Quit November 1 st) with severe COPD and recurrent aspiration pneumonia in the setting of known dysphagia. She is followed by Dr. Lake Bells.   06/17/2016 Hospital Follow Up: Pt. Presents for hospital Follow up. She was admitted to the hospital 05/30/2016 for worsening back pain. Cause was discovered to be new T7 compression fracture. She was discharged with planned readmission 06/06/2016 for Kyphoplasty, however on presentation on 06/06/2016 CXR indicated Right perihilar consolidation possibly as a result of atelectasis,Pneumonia is not entirely excluded. The T7 fracture complicated her cough mechanics resulting in mucous plugging and atelectasis. Procedure was postponed, CCM was consulted  to " tune patient up for procedure". She was treated with Continuous supplemental oxygen , Scheduled BD,Prednisone, and Empiric antibiotics. Kyphoplasty was done 06/10/2016. She states she has less pain.She states her breathing is much better now that her back pain is less intense.She was sent home on Amoxicillin x 4 days. [facility] did not have it in stock, so she had to wait 3 days. She will complete the dosing tonight.She is compliant with Spiriva and Breo. She is using her flutter valve. She is using her Mucinex. She has  not had to use neb treatments for the last week, she has not had to use her rescue inhaler.She is very deconditioned, but her insurance does not pay well for Pulmonary Rehab. She does have some home health nursing coming to her home, as well as physical therapy post the most recent hospitalization. She does note that she has some dizziness that she has had intermittently x about 1 year. I have encouraged her to follow up with her PCP in the short term. In the meanwhile I have asked her to make sure she has a counter to hold with ambulation, and to sit down whenever she is dizzy. We discussed that this puts her at risk for fall.She  denies fever,chest pain, orthopnea or hemoptysis. I have encouraged her to eat well as this contributes to her ability to recover from her multiple hospitalizations. We also spoke about  the importance of using her flutter valve for micellarly  clearance. She verbalized understanding of the above.   Tests CXR 06/17/2016: pending  Past medical hx Past Medical History:  Diagnosis Date  . Arthritis    "hands" (11/26/2012)  . CAD S/P percutaneous coronary angioplasty 1999; 2001;2003, 10/2014   a) BMS- mCx; b) re-do PCI for prog of Dz (NIR BMS 3.5 x 12); c) staged PCI: p-mRCA Express II BMS 3.0x24 & 3.5 x 16 --> p-mLAD Cyper DES 3.0 x 13 & PTCA of D2 (2.5 balloon); 10/2015 PCI p-m LAD Promus DES 2.75 x 38 (covers pre&post-stent ISR), POBA of ostial rPDA  . Cancer of base of tongue ([place]) 2010   "& lymph nodes @ right neck" (11/26/2012  . Carotid artery occlusion   . Chronic bronchitis ([place])   . COPD (chronic obstructive pulmonary disease) with emphysema ([place])   . Depression   . GERD (gastroesophageal reflux disease)   . H/O hiatal hernia   . History of blood transfusion   . History of DVT (deep vein thrombosis)   . Hx of radiation therapy   . Hx of radiation therapy 01/16/09 - 03/06/09   base of tongue, right neck node  . Hypercholesteremia   . Myocardial infarction 1999, 2003  . PAD (peripheral artery disease) ([name])    status post bilateral femoropopliteal bypass grafting --> Dr. Sherren Mocha Early; -  02/2013 Revision of R Fem-AKPop to BK Pop.  . Patent foramen ovale February 2013   Small. Small PFO noted on TTE for TIA/CVA R-L + Bubble study with valsalva  . Pneumonia   . Stroke [facility])    left side weakness remains (11/26/2012)  . Transient ischemic attack (TIA) February 2013  . Type II diabetes mellitus ([name])      Past surgical hx, Family hx, Social hx all reviewed.  Current Outpatient Prescriptions on File Prior to Visit  Medication Sig  . acetaminophen (TYLENOL) 325 MG tablet Take 2 tablets  (650 mg total) by mouth every 6 (six) hours as needed. (Patient taking differently: Take 650 mg by mouth every 6 (six) hours as needed for mild pain. )  . albuterol (PROVENTIL HFA;VENTOLIN HFA) 108 (90 BASE) MCG/ACT inhaler Inhale 2 puffs into the lungs every 6 (six) hours as needed for wheezing or shortness of breath.  Marland Kitchen amoxicillin-clavulanate (AUGMENTIN) 250-62.5 MG/5ML suspension Take 17.5 mLs (875 mg total) by mouth every 12 (twelve) hours.  Marland Kitchen aspirin EC 81 MG tablet Take 1 tablet (81 mg total) by mouth daily.  . bisoprolol (ZEBETA) 5 MG tablet Take 0.5 tablets (2.5 mg total) by mouth daily.  Marland Kitchen buPROPion (WELLBUTRIN XL) 300 MG 24 hr tablet Take 300 mg by mouth daily.   . clopidogrel (PLAVIX) 75 MG tablet Take 1 tablet (75 mg total) by mouth daily. Please don't take plavix until you have kyphoplasty. May resume after the procedure, please talk your PCP.  Marland Kitchen cyclobenzaprine (FLEXERIL) 5 MG tablet Take 1-2 tablets (5-10 mg total) by mouth 3 (three) times daily as needed for muscle spasms.  . feeding supplement, GLUCERNA SHAKE, (GLUCERNA SHAKE) LIQD Take 237 mLs by mouth 2 (two) times daily between meals.  . fish oil-omega-3 fatty acids 1000 MG capsule Take 1 g by mouth 2 (two) times daily.   . fluticasone (FLONASE) 50 MCG/ACT nasal spray Place 2 sprays into both nostrils daily.  . fluticasone furoate-vilanterol (BREO ELLIPTA) 100-25 MCG/INH AEPB Inhale 1 puff into the lungs daily.  Marland Kitchen guaiFENesin (MUCINEX) 600 MG 12 hr tablet Take 1,200 mg by mouth 2 (two) times daily.  Marland Kitchen ibuprofen (ADVIL,MOTRIN) 200 MG tablet Take 400 mg by mouth every 6 (six) hours as needed (Pain).  . insulin glargine (LANTUS) 100 UNIT/ML injection Inject 0.1 mLs (10 Units total) into the skin at bedtime. (Patient taking differently: Inject 10-20 Units into the skin at bedtime. )  . insulin lispro (HUMALOG) 100 UNIT/ML injection Inject 3 Units into the skin 3 (three) times daily with meals as needed for high blood sugar.   .  ipratropium-albuterol (DUONEB) 0.5-2.5 (3) MG/3ML SOLN Take 3 mLs by nebulization every 6 (six) hours. (Patient taking differently: Take 3 mLs by nebulization 2 (two) times daily as needed (for wheezingt or shortness of breath). )  . loratadine (CLARITIN) 10 MG tablet Take 1 tablet (10 mg total) by mouth daily.  . Maltodextrin-Xanthan Gum (RESOURCE THICKENUP CLEAR) POWD Use with thin liquids to make nectar thickened consistency  . nitroGLYCERIN (NITROLINGUAL) 0.4 MG/SPRAY spray Place 1 spray under the tongue every 5 (five) minutes as needed for chest pain.  Marland Kitchen omeprazole (PRILOSEC) 20 MG capsule Take 20 mg by mouth daily.   Marland Kitchen oxyCODONE-acetaminophen (PERCOCET/ROXICET) 5-325 MG tablet Take 1 tablet by mouth every 6 (six) hours as needed for moderate pain or severe pain.  . polyethylene glycol powder (GLYCOLAX/MIRALAX) powder Take 17 g by mouth 2 (two) times daily. (Patient taking differently: Take 17  g by mouth 2 (two) times daily as needed for moderate constipation. )  . rosuvastatin (CRESTOR) 20 MG tablet Take 20 mg by mouth daily.  Marland Kitchen SPIRIVA RESPIMAT 2.5 MCG/ACT AERS Take 2 puffs by mouth daily.   . traMADol (ULTRAM) 50 MG tablet Take 1 tablet (50 mg total) by mouth every 6 (six) hours as needed for moderate pain or severe pain.   No current facility-administered medications on file prior to visit.      Allergies  Allergen Reactions  . Tape Hives and Other (See Comments)    USE PAPER TAPE ONLY- Adhesive peels off skin-makes pt. Raw.  . Isosorbide Other (See Comments)    Drops BP too low  . Nicoderm [Nicotine] Rash    To the PATCH only, breakouts on skin    Review Of Systems:  Constitutional:   +  weight loss,no  night sweats,  Fevers, chills, fatigue, or  lassitude.  HEENT:   No headaches,  + Difficulty swallowing ( Chronic)  No Tooth/dental problems, or  Sore throat,                No sneezing, itching, ear ache, nasal congestion, post nasal drip,   CV:  No chest pain,  Orthopnea,  PND, swelling in lower extremities, anasarca, + dizziness ( intermittent lasting about 1 year), No palpitations, syncope.   GI  No heartburn, indigestion, abdominal pain, nausea, vomiting, diarrhea, change in bowel habits, +loss of appetite,no  bloody stools.   Resp: + shortness of breath with exertion or at rest ( at baseline).  + excess mucus, + productive cough,  No non-productive cough,  No coughing up of blood.  No change in color of mucus.  No wheezing.  No chest wall deformity  Skin: no rash or lesions.  GU: no dysuria, change in color of urine, no urgency or frequency.  No flank pain, no hematuria   MS:  No joint pain or swelling.  No decreased range of motion.  + back pain.( chronic)  Psych:  No change in mood or affect. No depression or anxiety.  No memory loss.   Vital Signs BP 114/70 (BP Location: Left Arm, Patient Position: Sitting, Cuff Size: Normal)   Pulse 67   Ht '5\' 5"'$  (1.651 m)   Wt 118 lb 9.6 oz (53.8 kg)   LMP  (LMP Unknown)   SpO2 96%   BMI 19.74 kg/m    Physical Exam:  General- No distress,  A&Ox3, frail female in wheelchair ENT: No sinus tenderness, TM clear, pale nasal mucosa, no oral exudate,no post nasal drip, no LAN Cardiac: S1, S2, regular rate and rhythm, no murmur Chest: No wheeze/ rales/ dullness; diminished per bases, no accessory muscle use, no nasal flaring, no sternal retractions Abd.: Soft Non-tender Ext: No clubbing cyanosis, edema Neuro: deconditioned, weak, poor appetite. Skin: No rashes, warm and dry Psych: normal mood and behavior   Assessment/Plan  Chronic hypoxemic respiratory failure ([name]) Continue oxygen a 2 L [place]  Dysphagia with aspiration Continue using your Thick It with liquids and concentrate on using your modified swallow technique to prevent aspiration which can lead to pneumonia  Protein-calorie malnutrition, severe Boost 1-2 times daily as meal supplement  COPD without exacerbation  CXR today. Continue your  Mucinex  Continue your Spiriva and Breo Continue wearing your oxygen at 2 L [place]. Continue using your Flutter valve. Continue using your Thick it in your liquids as you have been doing.. 3 month follow up with either Dr.  McQuaid or Eric Form, NP Please contact office for sooner follow up if symptoms do not improve or worsen or seek emergency care    Magdalen Spatz, NP 06/17/2016  1:33 PM

## 2016-06-17 NOTE — Patient Instructions (Addendum)
It is nice to meet you today We will check a CXR today. Continue your Mucinex  Continue your Spiriva and Breo Continue wearing your oxygen a t 2 L [place]. Continue using your Flutter valve. Continue using your Thick it in your liquids as you have been doing.. 3 month follow up with either Dr. Lake Bells or Eric Form, NP Please contact office for sooner follow up if symptoms do not improve or worsen or seek emergency care

## 2016-06-17 NOTE — Assessment & Plan Note (Signed)
Continue using your Thick It with liquids and concentrate on using your modified swallow technique to prevent aspiration which can lead to pneumonia

## 2016-06-21 ENCOUNTER — Telehealth: Payer: Self-pay | Admitting: Pulmonary Disease

## 2016-06-21 ENCOUNTER — Other Ambulatory Visit: Payer: Self-pay | Admitting: Pulmonary Disease

## 2016-06-21 MED ORDER — FIRST-DUKES MOUTHWASH MT SUSP: 5.0000 mL | Freq: Three times a day (TID) | OROMUCOSAL | 0 refills | Status: AC

## 2016-06-21 NOTE — Progress Notes (Signed)
Pt state she is having a low dose CT in March. Would you like to proceed with the repeat CXR for first week in March?

## 2016-06-21 NOTE — Telephone Encounter (Signed)
Notes Recorded by Magdalen Spatz, NP on 06/17/2016 at 3:36 PM EST Please call the patient and let her know her CXR is not completely clear. Please let her know we need her to come for a repeat CXR in 6 weeks,( First week in March) and that we will call her with the results. This will not be an office visit, just a CXR. She will then see Dr. Lake Bells as scheduled in April. Tell her to call us if she needs [place] sooner. Also, please call in Magic Mouthwash 5 cc's three times daily x 7 days swish and swallow. Thanks so much. ------------------------------------------------ Spoke with Brenda [name] with [facility]. States that pt is coughing up blood. Brenda [name] is aware of CXR and she will relay these to the pt. Pt is not complaining of any other pulmonary symptoms >> chest tightness, wheezing, SOB. Pt and her family are concerned about the coughing up blood as she is on a blood thinner.  BQ - please advise. Thanks.

## 2016-06-21 NOTE — Telephone Encounter (Signed)
Message will be routed to DOD.

## 2016-06-21 NOTE — Telephone Encounter (Signed)
For active bleeding will need to stop all blood thinners thru at least the weekend and stay off her feet. If getting any worse or breathing worse > to ER

## 2016-06-21 NOTE — Telephone Encounter (Signed)
Spoke with [name] and gave her MW recc. She will contact the pt. And speak with her. Nothing further is needed at this time.

## 2016-06-26 ENCOUNTER — Telehealth: Payer: Self-pay | Admitting: Acute Care

## 2016-06-26 NOTE — Progress Notes (Signed)
Spoke with pt and recommendations by Judson Roch, NP were relayed. Pt will bee scheduled to have CT in March. Please see phone note 06/26/2016. Nothing further needed.

## 2016-06-26 NOTE — Telephone Encounter (Signed)
Patient's CT has been scheduled at [facility] on 08/19/2016 @ 10:30am appt reminder mailed to patient

## 2016-06-26 NOTE — Telephone Encounter (Signed)
Notes Recorded by Magdalen Spatz, NP on 06/24/2016 at 3:22 PM EST The CT in March should be fine. Please make sure she is scheduled for the first week in March for CT. I do not see it scheduled yet. The order is there. Ask her to call us sooner if she develops any further fever, or worsening upper respiratory problems. Thanks so much.  Called and spoke to pt and is aware of CT chest. Order has already been placed.    [facility] please schedule CT chest for March.

## 2016-06-27 ENCOUNTER — Ambulatory Visit
Admission: RE | Admit: 2016-06-27 | Discharge: 2016-06-27 | Disposition: A | Discharge status: Home / Self Care | Payer: Medicare Other | Source: Ambulatory Visit | Attending: Interventional Radiology | Admitting: Interventional Radiology

## 2016-06-27 ENCOUNTER — Encounter: Payer: Self-pay | Admitting: Interventional Radiology

## 2016-06-27 DIAGNOSIS — S22000A Wedge compression fracture of unspecified thoracic vertebra, initial encounter for closed fracture: Secondary | ICD-10-CM

## 2016-06-27 HISTORY — PX: IR GENERIC HISTORICAL: IMG1180011

## 2016-06-27 NOTE — Progress Notes (Signed)
Referring Physician(s): Vann,J/Morrow,A  Chief Complaint: The patient is seen in follow up today s/p T7/T12 kyphoplasties on 06/10/16.  History of present illness: Mrs. Grahn is a 66 year old female with history of multiple medical problems including end-stage COPD with recent aspiration E. Coli PNA, CAD, s/p stenting (on plavix) etc who presented to the [facility] ED 05/30/16 with severe back pain. She had been coughing a lot secondary to her PNA and woke up from her nap on Christmas day with severe back pain between her shoulder blades. She was having significant pain simply with breathing, but almost unable to cough secondary to pain. She was placed on antibiotic therapy and discharged home on 06/03/16 by [facility].  She had imaging during hospitalization and was found to have a new T7, T12 compression fractures. She was deemed an appropriate candidate for T7, possible T12 KP and underwent T7 and T12 kyphoplasties on 06/10/16 by Dr. Pascal Lux. She tolerated the procedure well and was discharged home on 06/11/16. She presents today for routine outpatient follow-up. Following discharge the patient did undergo a short period of physical therapy. She states that the initial pain she was having between her shoulder blades has resolved. She does continue to have some intermittent right lower posterior lateral chest discomfort which is somewhat relieved with Tylenol/advil and tramadol. She remains on home O2 currently 4 L. She denies any fevers, headaches, substernal chest pain, worsening dyspnea, vomiting or abnormal bleeding. She does have some intermittent nausea as well as constipation. She has been seen by her primary care physician Dr. Orland Mustard since discharge with plans for follow-up imaging to assess prior pneumonia.   Past Medical History:  Diagnosis Date  . Arthritis    "hands" (11/26/2012)  . CAD S/P percutaneous coronary angioplasty 1999; 2001;2003, 10/2014   a) BMS- mCx; b) re-do PCI for prog of Dz (NIR BMS 3.5 x  12); c) staged PCI: p-mRCA Express II BMS 3.0x24 & 3.5 x 16 --> p-mLAD Cyper DES 3.0 x 13 & PTCA of D2 (2.5 balloon); 10/2015 PCI p-m LAD Promus DES 2.75 x 38 (covers pre&post-stent ISR), POBA of ostial rPDA  . Cancer of base of tongue ([place]) 2010   "& lymph nodes @ right neck" (11/26/2012  . Carotid artery occlusion   . Chronic bronchitis ([place])   . COPD (chronic obstructive pulmonary disease) with emphysema ([place])   . Depression   . GERD (gastroesophageal reflux disease)   . H/O hiatal hernia   . History of blood transfusion   . History of DVT (deep vein thrombosis)   . Hx of radiation therapy   . Hx of radiation therapy 01/16/09 - 03/06/09   base of tongue, right neck node  . Hypercholesteremia   . Myocardial infarction 1999, 2003  . PAD (peripheral artery disease) ([name])    status post bilateral femoropopliteal bypass grafting --> Dr. Sherren Mocha Early; -  02/2013 Revision of R Fem-AKPop to BK Pop.  . Patent foramen ovale February 2013   Small. Small PFO noted on TTE for TIA/CVA R-L + Bubble study with valsalva  . Pneumonia   . Stroke [facility])    left side weakness remains (11/26/2012)  . Transient ischemic attack (TIA) February 2013  . Type II diabetes mellitus ([place])     Past Surgical History:  Procedure Laterality Date  . ANKLE FRACTURE SURGERY Right 2007   "got a metal plate and 8 screws in it" (11/26/2012)  . APPENDECTOMY  1962  . BYPASS GRAFT POPLITEAL TO POPLITEAL Right 02/05/2013  Procedure: BYPASS GRAFT ABOVE KNEE POPLITEAL TO BELOW KNEE POPLITEAL WITH SMALL SAPHANOUS VEIN;  Surgeon: Rosetta Posner, MD;  Location: [place];  Service: Vascular;  Laterality: Right;  . CARDIAC CATHETERIZATION  1999; 10/2005   5/'07: mildLAD 20-30%, patent stent, diags- no Dz. RCA 2 BMS patent ~20% ISR.   Marland Kitchen CARDIAC CATHETERIZATION N/A 10/06/2015   Procedure: Left Heart Cath and Coronary Angiography;  Surgeon: Leonie Man, MD;  Location: [place] [facility];  Service: Cardiovascular; p-mLAD 99% pre-stent, 60% ISR &  80% post-stent, o-RPDA 90%, ~10% ISR in 2 RCA (Prox & mid) BMS,~20% ISR in pCx BMS with 5% OM2 BMS.    Marland Kitchen CARDIAC CATHETERIZATION N/A 10/06/2015   Procedure: Coronary Stent Intervention;  Surgeon: Leonie Man, MD;  Location: [place] [facility];  Service: Cardiovascular; p-m LAD Promus DES 2.75 x 38 (tapered post-dilatoin)  . CARDIAC CATHETERIZATION N/A 10/06/2015   Procedure: Coronary Balloon Angioplasty;  Surgeon: Leonie Man, MD;  Location: [place] [facility];  Service: Cardiovascular;  POBA of Ostial rPDA.  Marland Kitchen CARPAL TUNNEL RELEASE Right 1980's?  Marland Kitchen CATARACT EXTRACTION W/ INTRAOCULAR LENS IMPLANT Right 2011  . [place]  . CORONARY ANGIOPLASTY WITH STENT PLACEMENT  1999--2005   1999 - OM2 BMS; 2001: p-mCx NIR BMS 3.5 x 12; 10/2001: p-mRCA Express II BMS - 3.5 x 16 - 3.0 x 24.   . ESOPHAGOGASTRODUODENOSCOPY  12/20/2011   Procedure: ESOPHAGOGASTRODUODENOSCOPY (EGD);  Surgeon: Beryle Beams, MD;  Location: [facility];  Service: Endoscopy;  Laterality: N/A;  EGD with balloon dilation  . EYE SURGERY    . FEMORAL BYPASS  1999; 2000   Bilateral Fem-AK POP bypass.   . IR GENERIC HISTORICAL  06/10/2016   IR KYPHO EA ADDL LEVEL THORACIC OR LUMBAR 06/10/2016 Sandi Mariscal, MD [facility]  . IR GENERIC HISTORICAL  06/10/2016   IR KYPHO THORACIC WITH BONE BIOPSY 06/10/2016 Sandi Mariscal, MD [facility]  . PERIPHERAL VASCULAR CATHETERIZATION N/A 12/01/2015   Procedure: Abdominal Aortogram w/Lower Extremity;  Surgeon: Elam Dutch, MD;  Location: [place] [facility];  Service: Cardiovascular;  Laterality: N/A;  . PERIPHERAL VASCULAR CATHETERIZATION Right 12/22/2015   Procedure: Peripheral Vascular Intervention;  Surgeon: Rosetta Posner, MD;  Location: [place] [facility];  Service: Cardiovascular;  Laterality: Right;  EXternal iliac  . SAVORY DILATION  12/20/2011   Procedure: SAVORY DILATION;  Surgeon: Beryle Beams, MD;  Location: [facility];  Service: Endoscopy;  Laterality: N/A;  . TEE WITHOUT  CARDIOVERSION  07/26/2011   Procedure: TRANSESOPHAGEAL ECHOCARDIOGRAM (TEE);  Surgeon: Pixie Casino, MD;  Location: [facility]OSCOPY;  Service: Cardiovascular;  Laterality: N/A;  . TONSILLECTOMY  1969  . TRANSTHORACIC ECHOCARDIOGRAM  March 2016   Normal LV size and function EF 55-60%. No regional wall motion and amount is. No significant valvular lesions.  . TUBAL LIGATION  1988  . VAGINAL HYSTERECTOMY  1988    Allergies: Tape; Isosorbide; and Nicoderm [nicotine]  Medications: Prior to Admission medications   Medication Sig Start Date End Date Taking? Authorizing Provider  acetaminophen (TYLENOL) 325 MG tablet Take 2 tablets (650 mg total) by mouth every 6 (six) hours as needed. Patient taking differently: Take 650 mg by mouth every 6 (six) hours as needed for mild pain.  05/01/16   [name], PA-C  albuterol (PROVENTIL HFA;VENTOLIN HFA) 108 (90 BASE) MCG/ACT inhaler Inhale 2 puffs into the lungs every 6 (six) hours as needed for wheezing or shortness of  breath. 01/20/15   Belkys A Regalado, MD  amoxicillin-clavulanate (AUGMENTIN) 250-62.5 MG/5ML suspension Take 17.5 mLs (875 mg total) by mouth every 12 (twelve) hours. 06/11/16   Charlynne Cousins, MD  aspirin EC 81 MG tablet Take 1 tablet (81 mg total) by mouth daily. 10/07/15   Thurnell Lose, MD  bisoprolol (ZEBETA) 5 MG tablet Take 0.5 tablets (2.5 mg total) by mouth daily. 05/15/16   Orson Eva, MD  buPROPion (WELLBUTRIN XL) 300 MG 24 hr tablet Take 300 mg by mouth daily.  09/19/15   Historical Provider, MD  clopidogrel (PLAVIX) 75 MG tablet Take 1 tablet (75 mg total) by mouth daily. Please don't take plavix until you have kyphoplasty. May resume after the procedure, please talk your PCP. 06/12/16   Charlynne Cousins, MD  cyclobenzaprine (FLEXERIL) 5 MG tablet Take 1-2 tablets (5-10 mg total) by mouth 3 (three) times daily as needed for muscle spasms. 05/09/16   Naiping Ephriam Jenkins, MD  Diphenhyd-Hydrocort-Nystatin (FIRST-DUKES MOUTHWASH)  SUSP Use as directed 5 mLs in the mouth or throat 3 (three) times daily. 06/21/16 06/28/16  Magdalen Spatz, NP  feeding supplement, GLUCERNA SHAKE, (GLUCERNA SHAKE) LIQD Take 237 mLs by mouth 2 (two) times daily between meals. 04/09/16   Donita Brooks, NP  fish oil-omega-3 fatty acids 1000 MG capsule Take 1 g by mouth 2 (two) times daily.     Historical Provider, MD  fluticasone (FLONASE) 50 MCG/ACT nasal spray Place 2 sprays into both nostrils daily. 06/02/15   Debbe Odea, MD  fluticasone furoate-vilanterol (BREO ELLIPTA) 100-25 MCG/INH AEPB Inhale 1 puff into the lungs daily. 09/13/15   Tammy S Parrett, NP  guaiFENesin (MUCINEX) 600 MG 12 hr tablet Take 1,200 mg by mouth 2 (two) times daily.    Historical Provider, MD  ibuprofen (ADVIL,MOTRIN) 200 MG tablet Take 400 mg by mouth every 6 (six) hours as needed (Pain).    Historical Provider, MD  insulin glargine (LANTUS) 100 UNIT/ML injection Inject 0.1 mLs (10 Units total) into the skin at bedtime. Patient taking differently: Inject 10-20 Units into the skin at bedtime.  05/14/16   Orson Eva, MD  insulin lispro (HUMALOG) 100 UNIT/ML injection Inject 3 Units into the skin 3 (three) times daily with meals as needed for high blood sugar.     Historical Provider, MD  ipratropium-albuterol (DUONEB) 0.5-2.5 (3) MG/3ML SOLN Take 3 mLs by nebulization every 6 (six) hours. Patient taking differently: Take 3 mLs by nebulization 2 (two) times daily as needed (for wheezingt or shortness of breath).  06/02/15   Debbe Odea, MD  loratadine (CLARITIN) 10 MG tablet Take 1 tablet (10 mg total) by mouth daily. 06/02/15   Debbe Odea, MD  Maltodextrin-Xanthan Gum ([place]) POWD Use with thin liquids to make nectar thickened consistency 05/14/16   Orson Eva, MD  nitroGLYCERIN (NITROLINGUAL) 0.4 MG/SPRAY spray Place 1 spray under the tongue every 5 (five) minutes as needed for chest pain. 01/20/15   Belkys A Regalado, MD  omeprazole (PRILOSEC) 20 MG  capsule Take 20 mg by mouth daily.     Historical Provider, MD  oxyCODONE-acetaminophen (PERCOCET/ROXICET) 5-325 MG tablet Take 1 tablet by mouth every 6 (six) hours as needed for moderate pain or severe pain. 06/11/16   Charlynne Cousins, MD  polyethylene glycol powder (GLYCOLAX/MIRALAX) powder Take 17 g by mouth 2 (two) times daily. Patient taking differently: Take 17 g by mouth 2 (two) times daily as needed for moderate constipation.  05/01/16  [name], PA-C  rosuvastatin (CRESTOR) 20 MG tablet Take 20 mg by mouth daily.    Historical Provider, MD  SPIRIVA RESPIMAT 2.5 MCG/ACT AERS Take 2 puffs by mouth daily.  10/24/15   Historical Provider, MD  traMADol (ULTRAM) 50 MG tablet Take 1 tablet (50 mg total) by mouth every 6 (six) hours as needed for moderate pain or severe pain. 05/09/16   Leandrew Koyanagi, MD     Family History  Problem Relation Age of Onset  . Cancer Mother     pancreatic  . Diabetes Mother   . Hyperlipidemia Mother   . Hypertension Mother   . Other Mother     varicose vein  . Cancer Father 72    throat  . Heart disease Father   . Heart attack Father   . Cancer Sister     breast  . Diabetes Sister   . Deep vein thrombosis Brother   . Diabetes Brother   . Hearing loss Brother   . Hyperlipidemia Brother   . Hypertension Brother   . Heart attack Brother   . Clotting disorder Brother   . Diabetes Son   . Hyperlipidemia Son     Social History   Social History  . Marital status: Married    Spouse name: N/A  . Number of children: N/A  . Years of education: N/A   Occupational History  . house wife    Social History Main Topics  . Smoking status: Former Smoker    Packs/day: 1.00    Years: 50.00    Types: Cigarettes    Quit date: 04/03/2016  . Smokeless tobacco: Never Used     Comment: up to 0.5 ppd 02/13/16  . Alcohol use Yes     Comment: occ  . Drug use: No  . Sexual activity: No   Other Topics Concern  . Not on file   Social History Narrative   . No narrative on file     Vital Signs: BP 135/74 (BP Location: Left Arm, Patient Position: Sitting, Cuff Size: Normal)   Pulse 70   Temp 97.9 F (36.6 C) (Oral)   Resp 14   Ht 5' 5.5" (1.664 m)   Wt 120 lb (54.4 kg)   LMP  (LMP Unknown)   SpO2 100% Comment: O2 at 4L/Nasal Cannula  BMI 19.67 kg/m   Physical Exam   Awake and alert. Heart is RRR. Lungs are clear to auscultation bilaterally. Abdomen is soft, nontender, nondistended. Bowel sounds intact. Gross muscle strength intact as well as gait. All 3 incision sites are i/c/d.   Imaging: No results found.  Labs:  CBC:  Recent Labs  06/01/16 0507 06/03/16 0543 06/06/16 1249 06/07/16 0547  WBC 7.3 6.1 10.6* 7.7  HGB 10.4* 11.0* 10.9* 11.1*  HCT 33.6* 34.1* 33.7* 34.1*  PLT 245 230 209 239    COAGS:  Recent Labs  10/02/15 0951 10/05/15 1420 06/03/16 0543  INR  --  0.92 0.96  APTT 35  --   --     BMP:  Recent Labs  05/30/16 1423 05/30/16 1527 05/31/16 0550 06/06/16 1249 06/07/16 0547  NA 131*  --  134* 128* 136  K 5.4* 5.2* 4.6 4.3 4.1  CL 88*  --  93* 91* 97*  CO2 36*  --  34* 30 32  GLUCOSE 485*  --  197* 279* 120*  BUN 19  --  22* 18 13  CALCIUM 9.4  --  8.7* 8.7* 8.7*  CREATININE 0.86  --  0.65 0.74 0.68  GFRNONAA >60  --  >60 >60 >60  GFRAA >60  --  >60 >60 >60    LIVER FUNCTION TESTS:  Recent Labs  04/03/16 1136 05/10/16 0323 05/20/16 1319 06/07/16 0547  BILITOT 0.7 1.7* 0.7 0.7  AST 16 20 14* 15  ALT 19 16 12* 15  ALKPHOS 115 175* 161* 149*  PROT 6.8 5.0* 5.2* 5.7*  ALBUMIN 3.7 2.3* 2.2* 2.8*    Assessment: Patient with history of symptomatic T7/T12 compression fractures, status post kyphoplasties 06/10/16. Currently stable with resolution of original intrascapular pain prior to kyphoplasty. No obvious  complications from above procedure. Recommend that patient continue current care with Dr. Orland Mustard and follow-up with our service with onset of any new acute back pain  issues.   Signed: D. Rowe Robert 06/27/2016, 11:36 AM   Please refer to Dr. Deniece Portela attestation of this note for management and plan.      Patient ID: [name], female   DOB: 11/29/1950, 66 y.o.   MRN: [id number]

## 2016-08-01 ENCOUNTER — Other Ambulatory Visit: Payer: Self-pay | Admitting: Adult Health

## 2016-08-06 ENCOUNTER — Emergency Department (HOSPITAL_COMMUNITY): Payer: Medicare Other

## 2016-08-06 ENCOUNTER — Inpatient Hospital Stay (HOSPITAL_COMMUNITY)
Admission: EM | Admit: 2016-08-06 | Discharge: 2016-08-12 | DRG: 871 | Disposition: A | Discharge status: Hospice | Payer: Medicare Other | Attending: Internal Medicine | Admitting: Internal Medicine

## 2016-08-06 ENCOUNTER — Encounter (HOSPITAL_COMMUNITY): Payer: Self-pay | Admitting: Emergency Medicine

## 2016-08-06 ENCOUNTER — Ambulatory Visit: Payer: Medicare Other | Admitting: Cardiology

## 2016-08-06 DIAGNOSIS — I252 Old myocardial infarction: Secondary | ICD-10-CM

## 2016-08-06 DIAGNOSIS — E1121 Type 2 diabetes mellitus with diabetic nephropathy: Secondary | ICD-10-CM | POA: Diagnosis not present

## 2016-08-06 DIAGNOSIS — J441 Chronic obstructive pulmonary disease with (acute) exacerbation: Secondary | ICD-10-CM | POA: Diagnosis present

## 2016-08-06 DIAGNOSIS — R1310 Dysphagia, unspecified: Secondary | ICD-10-CM

## 2016-08-06 DIAGNOSIS — Z87891 Personal history of nicotine dependence: Secondary | ICD-10-CM | POA: Diagnosis not present

## 2016-08-06 DIAGNOSIS — Z7982 Long term (current) use of aspirin: Secondary | ICD-10-CM | POA: Diagnosis not present

## 2016-08-06 DIAGNOSIS — E860 Dehydration: Secondary | ICD-10-CM | POA: Diagnosis present

## 2016-08-06 DIAGNOSIS — A409 Streptococcal sepsis, unspecified: Secondary | ICD-10-CM | POA: Diagnosis present

## 2016-08-06 DIAGNOSIS — E785 Hyperlipidemia, unspecified: Secondary | ICD-10-CM | POA: Diagnosis present

## 2016-08-06 DIAGNOSIS — E1165 Type 2 diabetes mellitus with hyperglycemia: Secondary | ICD-10-CM | POA: Diagnosis present

## 2016-08-06 DIAGNOSIS — J690 Pneumonitis due to inhalation of food and vomit: Secondary | ICD-10-CM | POA: Diagnosis present

## 2016-08-06 DIAGNOSIS — Z515 Encounter for palliative care: Secondary | ICD-10-CM | POA: Diagnosis present

## 2016-08-06 DIAGNOSIS — E11649 Type 2 diabetes mellitus with hypoglycemia without coma: Secondary | ICD-10-CM | POA: Diagnosis present

## 2016-08-06 DIAGNOSIS — Z9981 Dependence on supplemental oxygen: Secondary | ICD-10-CM

## 2016-08-06 DIAGNOSIS — Z7951 Long term (current) use of inhaled steroids: Secondary | ICD-10-CM | POA: Diagnosis not present

## 2016-08-06 DIAGNOSIS — E871 Hypo-osmolality and hyponatremia: Secondary | ICD-10-CM | POA: Diagnosis not present

## 2016-08-06 DIAGNOSIS — F329 Major depressive disorder, single episode, unspecified: Secondary | ICD-10-CM | POA: Diagnosis not present

## 2016-08-06 DIAGNOSIS — Z86718 Personal history of other venous thrombosis and embolism: Secondary | ICD-10-CM

## 2016-08-06 DIAGNOSIS — E1151 Type 2 diabetes mellitus with diabetic peripheral angiopathy without gangrene: Secondary | ICD-10-CM | POA: Diagnosis present

## 2016-08-06 DIAGNOSIS — A419 Sepsis, unspecified organism: Secondary | ICD-10-CM

## 2016-08-06 DIAGNOSIS — IMO0002 Reserved for concepts with insufficient information to code with codable children: Secondary | ICD-10-CM | POA: Diagnosis present

## 2016-08-06 DIAGNOSIS — J9621 Acute and chronic respiratory failure with hypoxia: Secondary | ICD-10-CM | POA: Diagnosis not present

## 2016-08-06 DIAGNOSIS — Z923 Personal history of irradiation: Secondary | ICD-10-CM

## 2016-08-06 DIAGNOSIS — K219 Gastro-esophageal reflux disease without esophagitis: Secondary | ICD-10-CM | POA: Diagnosis present

## 2016-08-06 DIAGNOSIS — J42 Unspecified chronic bronchitis: Secondary | ICD-10-CM | POA: Diagnosis not present

## 2016-08-06 DIAGNOSIS — Z955 Presence of coronary angioplasty implant and graft: Secondary | ICD-10-CM | POA: Diagnosis not present

## 2016-08-06 DIAGNOSIS — J189 Pneumonia, unspecified organism: Secondary | ICD-10-CM | POA: Diagnosis present

## 2016-08-06 DIAGNOSIS — Z794 Long term (current) use of insulin: Secondary | ICD-10-CM | POA: Diagnosis not present

## 2016-08-06 DIAGNOSIS — J449 Chronic obstructive pulmonary disease, unspecified: Secondary | ICD-10-CM | POA: Diagnosis present

## 2016-08-06 DIAGNOSIS — E43 Unspecified severe protein-calorie malnutrition: Secondary | ICD-10-CM | POA: Diagnosis not present

## 2016-08-06 DIAGNOSIS — I739 Peripheral vascular disease, unspecified: Secondary | ICD-10-CM | POA: Diagnosis present

## 2016-08-06 DIAGNOSIS — I25119 Atherosclerotic heart disease of native coronary artery with unspecified angina pectoris: Secondary | ICD-10-CM | POA: Diagnosis not present

## 2016-08-06 DIAGNOSIS — E876 Hypokalemia: Secondary | ICD-10-CM | POA: Diagnosis not present

## 2016-08-06 DIAGNOSIS — R7881 Bacteremia: Secondary | ICD-10-CM | POA: Diagnosis not present

## 2016-08-06 DIAGNOSIS — Z720 Tobacco use: Secondary | ICD-10-CM | POA: Diagnosis not present

## 2016-08-06 DIAGNOSIS — I2510 Atherosclerotic heart disease of native coronary artery without angina pectoris: Secondary | ICD-10-CM | POA: Diagnosis present

## 2016-08-06 DIAGNOSIS — E7800 Pure hypercholesterolemia, unspecified: Secondary | ICD-10-CM | POA: Diagnosis present

## 2016-08-06 DIAGNOSIS — Z79899 Other long term (current) drug therapy: Secondary | ICD-10-CM

## 2016-08-06 DIAGNOSIS — Z7902 Long term (current) use of antithrombotics/antiplatelets: Secondary | ICD-10-CM | POA: Diagnosis not present

## 2016-08-06 DIAGNOSIS — Z6821 Body mass index (BMI) 21.0-21.9, adult: Secondary | ICD-10-CM

## 2016-08-06 DIAGNOSIS — Z833 Family history of diabetes mellitus: Secondary | ICD-10-CM | POA: Diagnosis not present

## 2016-08-06 DIAGNOSIS — Z66 Do not resuscitate: Secondary | ICD-10-CM | POA: Diagnosis present

## 2016-08-06 DIAGNOSIS — F1010 Alcohol abuse, uncomplicated: Secondary | ICD-10-CM | POA: Diagnosis present

## 2016-08-06 DIAGNOSIS — Z8673 Personal history of transient ischemic attack (TIA), and cerebral infarction without residual deficits: Secondary | ICD-10-CM

## 2016-08-06 DIAGNOSIS — F32A Depression, unspecified: Secondary | ICD-10-CM | POA: Diagnosis present

## 2016-08-06 DIAGNOSIS — A403 Sepsis due to Streptococcus pneumoniae: Secondary | ICD-10-CM | POA: Diagnosis not present

## 2016-08-06 DIAGNOSIS — R5383 Other fatigue: Secondary | ICD-10-CM | POA: Diagnosis present

## 2016-08-06 DIAGNOSIS — G459 Transient cerebral ischemic attack, unspecified: Secondary | ICD-10-CM | POA: Diagnosis present

## 2016-08-06 DIAGNOSIS — Z85810 Personal history of malignant neoplasm of tongue: Secondary | ICD-10-CM

## 2016-08-06 DIAGNOSIS — J181 Lobar pneumonia, unspecified organism: Secondary | ICD-10-CM | POA: Diagnosis not present

## 2016-08-06 LAB — URINALYSIS, ROUTINE W REFLEX MICROSCOPIC
Bilirubin Urine: NEGATIVE
Glucose, UA: 50 mg/dL — AB
Hgb urine dipstick: NEGATIVE
KETONES UR: NEGATIVE mg/dL
LEUKOCYTES UA: NEGATIVE
Nitrite: NEGATIVE
PROTEIN: 100 mg/dL — AB
Specific Gravity, Urine: 1.019 (ref 1.005–1.030)
pH: 7 (ref 5.0–8.0)

## 2016-08-06 LAB — CBC WITH DIFFERENTIAL/PLATELET
BASOS PCT: 0 %
Basophils Absolute: 0 10*3/uL (ref 0.0–0.1)
EOS PCT: 0 %
Eosinophils Absolute: 0 10*3/uL (ref 0.0–0.7)
HEMATOCRIT: 38.4 % (ref 36.0–46.0)
HEMOGLOBIN: 12.5 g/dL (ref 12.0–15.0)
Lymphocytes Relative: 3 %
Lymphs Abs: 0.7 10*3/uL (ref 0.7–4.0)
MCH: 28.2 pg (ref 26.0–34.0)
MCHC: 32.6 g/dL (ref 30.0–36.0)
MCV: 86.5 fL (ref 78.0–100.0)
MONO ABS: 0.7 10*3/uL (ref 0.1–1.0)
Monocytes Relative: 3 %
NEUTROS PCT: 94 %
Neutro Abs: 20.5 10*3/uL — ABNORMAL HIGH (ref 1.7–7.7)
Platelets: 203 10*3/uL (ref 150–400)
RBC: 4.44 MIL/uL (ref 3.87–5.11)
RDW: 14.8 % (ref 11.5–15.5)
WBC: 21.9 10*3/uL — ABNORMAL HIGH (ref 4.0–10.5)

## 2016-08-06 LAB — BLOOD GAS, ARTERIAL
ACID-BASE EXCESS: 2.4 mmol/L — AB (ref 0.0–2.0)
BICARBONATE: 27.1 mmol/L (ref 20.0–28.0)
DRAWN BY: 270211
O2 Content: 5 L/min
O2 Saturation: 94.8 %
PATIENT TEMPERATURE: 98.6
pCO2 arterial: 45.1 mmHg (ref 32.0–48.0)
pH, Arterial: 7.396 (ref 7.350–7.450)
pO2, Arterial: 76.4 mmHg — ABNORMAL LOW (ref 83.0–108.0)

## 2016-08-06 LAB — PROTIME-INR
INR: 0.9
PROTHROMBIN TIME: 12.2 s (ref 11.4–15.2)

## 2016-08-06 LAB — COMPREHENSIVE METABOLIC PANEL
ALT: 14 U/L (ref 14–54)
AST: 23 U/L (ref 15–41)
Albumin: 2.7 g/dL — ABNORMAL LOW (ref 3.5–5.0)
Alkaline Phosphatase: 215 U/L — ABNORMAL HIGH (ref 38–126)
Anion gap: 10 (ref 5–15)
BUN: 28 mg/dL — AB (ref 6–20)
CHLORIDE: 87 mmol/L — AB (ref 101–111)
CO2: 29 mmol/L (ref 22–32)
CREATININE: 0.9 mg/dL (ref 0.44–1.00)
Calcium: 8.9 mg/dL (ref 8.9–10.3)
GFR calc non Af Amer: >60 mL/min (ref >60)
Glucose, Bld: 217 mg/dL — ABNORMAL HIGH (ref 65–99)
Potassium: 4 mmol/L (ref 3.5–5.1)
SODIUM: 126 mmol/L — AB (ref 135–145)
Total Bilirubin: 1.3 mg/dL — ABNORMAL HIGH (ref 0.3–1.2)
Total Protein: 6.5 g/dL (ref 6.5–8.1)

## 2016-08-06 LAB — MRSA PCR SCREENING: MRSA BY PCR: NEGATIVE

## 2016-08-06 LAB — GLUCOSE, CAPILLARY: Glucose-Capillary: 97 mg/dL (ref 65–99)

## 2016-08-06 LAB — BRAIN NATRIURETIC PEPTIDE: B Natriuretic Peptide: 728.4 pg/mL — ABNORMAL HIGH (ref 0.0–100.0)

## 2016-08-06 LAB — MAGNESIUM: MAGNESIUM: 2 mg/dL (ref 1.7–2.4)

## 2016-08-06 LAB — I-STAT TROPONIN, ED
TROPONIN I, POC: 0 ng/mL (ref 0.00–0.08)
TROPONIN I, POC: 0 ng/mL (ref 0.00–0.08)

## 2016-08-06 LAB — STREP PNEUMONIAE URINARY ANTIGEN: STREP PNEUMO URINARY ANTIGEN: POSITIVE — AB

## 2016-08-06 LAB — I-STAT CG4 LACTIC ACID, ED
Lactic Acid, Venous: 2.31 mmol/L (ref 0.5–1.9)
Lactic Acid, Venous: 1.73 mmol/L (ref 0.5–1.9)

## 2016-08-06 LAB — CBG MONITORING, ED: GLUCOSE-CAPILLARY: 219 mg/dL — AB (ref 65–99)

## 2016-08-06 LAB — TSH: TSH: 3.593 u[IU]/mL (ref 0.350–4.500)

## 2016-08-06 MED ORDER — ACETAMINOPHEN 325 MG PO TABS: 650.0000 mg | ORAL_TABLET | Freq: Four times a day (QID) | ORAL | Status: DC | PRN

## 2016-08-06 MED ORDER — INSULIN ASPART 100 UNIT/ML ~~LOC~~ SOLN
0.0000 [IU] | Freq: Three times a day (TID) | SUBCUTANEOUS | Status: DC
  Administered 2016-08-06 – 2016-08-08 (×2): 3 [IU] via SUBCUTANEOUS
  Administered 2016-08-08: 2 [IU] via SUBCUTANEOUS
  Administered 2016-08-09: 1 [IU] via SUBCUTANEOUS
  Administered 2016-08-09: 3 [IU] via SUBCUTANEOUS
  Administered 2016-08-11 – 2016-08-12 (×2): 2 [IU] via SUBCUTANEOUS
  Filled 2016-08-06: qty 1

## 2016-08-06 MED ORDER — ASPIRIN EC 81 MG PO TBEC
81.0000 mg | DELAYED_RELEASE_TABLET | Freq: Every day | ORAL | Status: DC
  Administered 2016-08-08 – 2016-08-12 (×5): 81 mg via ORAL
  Filled 2016-08-06 (×5): qty 1

## 2016-08-06 MED ORDER — SODIUM CHLORIDE 0.9 % IV BOLUS (SEPSIS)
1000.0000 mL | Freq: Once | INTRAVENOUS | Status: AC
  Administered 2016-08-06: 1000 mL via INTRAVENOUS

## 2016-08-06 MED ORDER — ROSUVASTATIN CALCIUM 10 MG PO TABS
20.0000 mg | ORAL_TABLET | Freq: Every day | ORAL | Status: DC
  Administered 2016-08-07 – 2016-08-12 (×6): 20 mg via ORAL
  Filled 2016-08-06: qty 1
  Filled 2016-08-06 (×5): qty 2

## 2016-08-06 MED ORDER — VANCOMYCIN HCL 500 MG IV SOLR
500.0000 mg | Freq: Two times a day (BID) | INTRAVENOUS | Status: DC
  Administered 2016-08-07: 500 mg via INTRAVENOUS
  Filled 2016-08-06 (×2): qty 500

## 2016-08-06 MED ORDER — PANTOPRAZOLE SODIUM 40 MG PO TBEC
40.0000 mg | DELAYED_RELEASE_TABLET | Freq: Every day | ORAL | Status: DC
  Administered 2016-08-08 – 2016-08-12 (×5): 40 mg via ORAL
  Filled 2016-08-06 (×5): qty 1

## 2016-08-06 MED ORDER — LEVALBUTEROL HCL 0.63 MG/3ML IN NEBU
0.6300 mg | INHALATION_SOLUTION | Freq: Four times a day (QID) | RESPIRATORY_TRACT | Status: DC | PRN
  Administered 2016-08-09: 0.63 mg via RESPIRATORY_TRACT
  Filled 2016-08-06: qty 3

## 2016-08-06 MED ORDER — DEXTROSE 5 % IV SOLN
2.0000 g | Freq: Once | INTRAVENOUS | Status: AC
  Administered 2016-08-06: 2 g via INTRAVENOUS
  Filled 2016-08-06: qty 2

## 2016-08-06 MED ORDER — ALBUTEROL SULFATE (2.5 MG/3ML) 0.083% IN NEBU: 3.0000 mL | INHALATION_SOLUTION | Freq: Four times a day (QID) | RESPIRATORY_TRACT | Status: DC | PRN

## 2016-08-06 MED ORDER — SODIUM CHLORIDE 0.9 % IV BOLUS (SEPSIS)
250.0000 mL | Freq: Once | INTRAVENOUS | Status: AC
  Administered 2016-08-06: 250 mL via INTRAVENOUS

## 2016-08-06 MED ORDER — BISOPROLOL FUMARATE 5 MG PO TABS: 2.5000 mg | ORAL_TABLET | Freq: Every day | ORAL | Status: DC

## 2016-08-06 MED ORDER — GUAIFENESIN ER 600 MG PO TB12
1200.0000 mg | ORAL_TABLET | Freq: Two times a day (BID) | ORAL | Status: DC
  Administered 2016-08-07 – 2016-08-12 (×10): 1200 mg via ORAL
  Filled 2016-08-06 (×11): qty 2

## 2016-08-06 MED ORDER — SODIUM CHLORIDE 0.9% FLUSH
3.0000 mL | Freq: Two times a day (BID) | INTRAVENOUS | Status: DC
  Administered 2016-08-06 – 2016-08-07 (×3): 3 mL via INTRAVENOUS

## 2016-08-06 MED ORDER — ACETAMINOPHEN 650 MG RE SUPP: 650.0000 mg | Freq: Four times a day (QID) | RECTAL | Status: DC | PRN

## 2016-08-06 MED ORDER — ONDANSETRON HCL 4 MG/2ML IJ SOLN: 4.0000 mg | Freq: Four times a day (QID) | INTRAMUSCULAR | Status: DC | PRN

## 2016-08-06 MED ORDER — VANCOMYCIN HCL IN DEXTROSE 1-5 GM/200ML-% IV SOLN
1000.0000 mg | Freq: Once | INTRAVENOUS | Status: AC
  Administered 2016-08-06: 1000 mg via INTRAVENOUS
  Filled 2016-08-06: qty 200

## 2016-08-06 MED ORDER — IOPAMIDOL (ISOVUE-370) INJECTION 76%
100.0000 mL | Freq: Once | INTRAVENOUS | Status: AC | PRN
  Administered 2016-08-06: 100 mL via INTRAVENOUS

## 2016-08-06 MED ORDER — LORATADINE 10 MG PO TABS
10.0000 mg | ORAL_TABLET | Freq: Every day | ORAL | Status: DC
  Administered 2016-08-08 – 2016-08-12 (×5): 10 mg via ORAL
  Filled 2016-08-06 (×5): qty 1

## 2016-08-06 MED ORDER — INSULIN GLARGINE 100 UNIT/ML ~~LOC~~ SOLN
10.0000 [IU] | Freq: Every day | SUBCUTANEOUS | Status: DC
  Administered 2016-08-07 (×2): 10 [IU] via SUBCUTANEOUS
  Filled 2016-08-06 (×2): qty 0.1

## 2016-08-06 MED ORDER — FLUTICASONE FUROATE-VILANTEROL 100-25 MCG/INH IN AEPB
1.0000 | INHALATION_SPRAY | Freq: Every day | RESPIRATORY_TRACT | Status: DC
  Administered 2016-08-07 – 2016-08-12 (×6): 1 via RESPIRATORY_TRACT
  Filled 2016-08-06: qty 28

## 2016-08-06 MED ORDER — IOPAMIDOL (ISOVUE-370) INJECTION 76%
INTRAVENOUS | Status: AC
  Filled 2016-08-06: qty 100

## 2016-08-06 MED ORDER — ENOXAPARIN SODIUM 40 MG/0.4ML ~~LOC~~ SOLN
40.0000 mg | SUBCUTANEOUS | Status: DC
  Administered 2016-08-06 – 2016-08-11 (×6): 40 mg via SUBCUTANEOUS
  Filled 2016-08-06 (×6): qty 0.4

## 2016-08-06 MED ORDER — BUPROPION HCL ER (XL) 300 MG PO TB24
300.0000 mg | ORAL_TABLET | Freq: Every day | ORAL | Status: DC
  Administered 2016-08-08 – 2016-08-12 (×5): 300 mg via ORAL
  Filled 2016-08-06 (×5): qty 1

## 2016-08-06 MED ORDER — ONDANSETRON HCL 4 MG PO TABS: 4.0000 mg | ORAL_TABLET | Freq: Four times a day (QID) | ORAL | Status: DC | PRN

## 2016-08-06 MED ORDER — TRAMADOL HCL 50 MG PO TABS
50.0000 mg | ORAL_TABLET | Freq: Four times a day (QID) | ORAL | Status: DC | PRN
  Administered 2016-08-12: 50 mg via ORAL
  Filled 2016-08-06: qty 1

## 2016-08-06 MED ORDER — DEXTROSE 5 % IV SOLN
1.0000 g | Freq: Three times a day (TID) | INTRAVENOUS | Status: DC
  Administered 2016-08-06 – 2016-08-07 (×2): 1 g via INTRAVENOUS
  Filled 2016-08-06 (×4): qty 1

## 2016-08-06 MED ORDER — SODIUM CHLORIDE 0.9 % IV BOLUS (SEPSIS)
500.0000 mL | Freq: Once | INTRAVENOUS | Status: AC
  Administered 2016-08-06: 500 mL via INTRAVENOUS

## 2016-08-06 MED ORDER — POLYETHYLENE GLYCOL 3350 17 G PO PACK
17.0000 g | PACK | Freq: Two times a day (BID) | ORAL | Status: DC
  Administered 2016-08-08 – 2016-08-12 (×9): 17 g via ORAL
  Filled 2016-08-06 (×9): qty 1

## 2016-08-06 MED ORDER — SODIUM CHLORIDE 0.9 % IV SOLN
INTRAVENOUS | Status: DC
  Administered 2016-08-06 – 2016-08-10 (×8): via INTRAVENOUS

## 2016-08-06 MED ORDER — CYCLOBENZAPRINE HCL 5 MG PO TABS: 5.0000 mg | ORAL_TABLET | Freq: Three times a day (TID) | ORAL | Status: DC | PRN

## 2016-08-06 MED ORDER — FLUTICASONE PROPIONATE 50 MCG/ACT NA SUSP
2.0000 | Freq: Every day | NASAL | Status: DC
  Administered 2016-08-08 – 2016-08-12 (×5): 2 via NASAL
  Filled 2016-08-06: qty 16

## 2016-08-06 MED ORDER — ACETAMINOPHEN 325 MG PO TABS
650.0000 mg | ORAL_TABLET | Freq: Once | ORAL | Status: AC
  Administered 2016-08-06: 650 mg via ORAL
  Filled 2016-08-06: qty 2

## 2016-08-06 MED ORDER — CLOPIDOGREL BISULFATE 75 MG PO TABS
75.0000 mg | ORAL_TABLET | Freq: Every day | ORAL | Status: DC
  Administered 2016-08-08 – 2016-08-12 (×5): 75 mg via ORAL
  Filled 2016-08-06 (×5): qty 1

## 2016-08-06 NOTE — ED Notes (Signed)
Abnormal lab result MD Tegeler and RN Frederico Hamman have been made aware

## 2016-08-06 NOTE — Progress Notes (Signed)
Pharmacy Antibiotic Note  Brenda [name] is a 66 y.o. female presented to the ED on 08/06/16 with c/o CP.  CXR with concern for worsening of PNA.  To start vancomycin and cefepime for broad coverage.  - afeb, wbc elevated, scr 0.90 (crcl~54)  Plan: - cefepime 2gm IV x1,  Then 1 gm IV q8h - vancomycin 1 gm IV x1, then 500 mg IV q12h  _______________________________  Height: '5\' 5"'$  (165.1 cm) Weight: 121 lb (54.9 kg) IBW/kg (Calculated) : 57  Temp (24hrs), Avg:97.7 F (36.5 C), Min:97.7 F (36.5 C), Max:97.7 F (36.5 C)   Recent Labs Lab 08/06/16 1204 08/06/16 1214  WBC 21.9*  --   CREATININE 0.90  --   LATICACIDVEN  --  2.31*    Estimated Creatinine Clearance: 54 mL/min (by C-G formula based on SCr of 0.9 mg/dL).    Allergies  Allergen Reactions  . Tape Hives and Other (See Comments)    USE PAPER TAPE ONLY- Adhesive peels off skin-makes pt. Raw.  . Isosorbide Other (See Comments)    Drops BP too low  . Nicoderm [Nicotine] Rash    To the PATCH only, breakouts on skin     Thank you for allowing pharmacy to be a part of this patient's care.  Lynelle Doctor 08/06/2016 1:18 PM

## 2016-08-06 NOTE — ED Notes (Signed)
Blood cultures obtained on pt and sent down at 1239, [id number].

## 2016-08-06 NOTE — ED Triage Notes (Signed)
Pt reports chest pain x 2 days. Hx COPD. Per husband pt has been weak, dizzy x 2 days and unable to do her ADLs . HX throat cancer 2 years ago , no chemo nor radiation since 2 years ago. Pt appears lethargic and unable to sit up right without assistance.

## 2016-08-06 NOTE — ED Notes (Signed)
Pt has rectal temp of 101.2.

## 2016-08-06 NOTE — Progress Notes (Signed)
RT titrating PT 02 (PT uses 2lpm [place] at home). RT removed NRB (Sp02 100%) from PT and placed on 4 lpm [place]- RN aware.

## 2016-08-06 NOTE — ED Provider Notes (Signed)
[facility] DEPT Provider Note   CSN: [id number] Arrival date & time: 08/06/16  1108     History   Chief Complaint Chief Complaint  Patient presents with  . Chest Pain    HPI Brenda [name] is a 66 y.o. female with a past medical history significant for coronary artery disease status post PCI, diabetes, stroke on aspirin and Plavix, COPD on 2 L nasal cannula at baseline and recent COPD exacerbation, GERD, and throat cancer who presents with 2 days of chest pain, fatigue, dizziness, productive cough, and nausea. Patient is accompanied by her husband who reports that the patient has had chest pain for the last few days. Patient reports that this feels more like a pneumonia than cardiac problems but she does say she is having chest pain. She describes the pain as a pressure-like pain in her central chest. She denies worsening shortness of breath but is hypoxic upon arrival. Patient reports a productive cough over the last few days. She also says that she has been having some subjective fevers and chills. She denies radiation of the pain. She denies abdominal pain. She does report that she has had some room spinning sensation but denies vision changes, focal neurologic deficits from baseline. she does report some nausea but no vomiting. She denies conservation, diarrhea, or dysuria. She denies any recent trauma. Patient is on aspirin and Plavix for prior stroke.   He denies hemoptysis. She denies exertional component of the chest pain. She denies pleuritic pain.    HPI  Past Medical History:  Diagnosis Date  . Arthritis    "hands" (11/26/2012)  . CAD S/P percutaneous coronary angioplasty 1999; 2001;2003, 10/2014   a) BMS- mCx; b) re-do PCI for prog of Dz (NIR BMS 3.5 x 12); c) staged PCI: p-mRCA Express II BMS 3.0x24 & 3.5 x 16 --> p-mLAD Cyper DES 3.0 x 13 & PTCA of D2 (2.5 balloon); 10/2015 PCI p-m LAD Promus DES 2.75 x 38 (covers pre&post-stent ISR), POBA of ostial rPDA  . Cancer of base  of tongue ([place]) 2010   "& lymph nodes @ right neck" (11/26/2012  . Carotid artery occlusion   . Chronic bronchitis ([place])   . COPD (chronic obstructive pulmonary disease) with emphysema ([place])   . Depression   . GERD (gastroesophageal reflux disease)   . H/O hiatal hernia   . History of blood transfusion   . History of DVT (deep vein thrombosis)   . Hx of radiation therapy   . Hx of radiation therapy 01/16/09 - 03/06/09   base of tongue, right neck node  . Hypercholesteremia   . Myocardial infarction 1999, 2003  . PAD (peripheral artery disease) ([name])    status post bilateral femoropopliteal bypass grafting --> Dr. Sherren Mocha Early; -  02/2013 Revision of R Fem-AKPop to BK Pop.  . Patent foramen ovale February 2013   Small. Small PFO noted on TTE for TIA/CVA R-L + Bubble study with valsalva  . Pneumonia   . Stroke [facility])    left side weakness remains (11/26/2012)  . Transient ischemic attack (TIA) February 2013  . Type II diabetes mellitus [facility])     Patient Active Problem List   Diagnosis Date Noted  . Acute on chronic respiratory failure with hypoxemia ([place]) 06/06/2016  . Pneumonia 06/06/2016  . Closed compression fracture of thoracic vertebra ([place])   . Recurrent aspiration pneumonia ([place]) 05/30/2016  . Aspiration pneumonia of right lower lobe ([place]) 05/11/2016  . Sepsis, unspecified organism ([place]) 05/10/2016  .  Sepsis ([place]) 05/10/2016  . Lobar pneumonia ([place])   . Constipation 05/06/2016  . Severe protein-calorie malnutrition Altamease [place]: less than 60% of standard weight) ([place]) 04/05/2016  . COPD with acute exacerbation ([place]) 04/03/2016  . Ulcer of right leg ([place]) 11/09/2015  . Hypotension 10/26/2015  . Angina pectoris ([place]) 10/06/2015  . CAD S/P percutaneous coronary angioplasty   . Acute on chronic respiratory failure with hypoxia ([place]) 10/05/2015  . Protein-calorie malnutrition, moderate ([place]) 10/05/2015  . Depression 10/05/2015  . GERD (gastroesophageal reflux disease) 10/05/2015  .  Dysphagia with aspiration 06/02/2015  . Thrush 05/18/2015  . Chronic hypoxemic respiratory failure ([place]) 11/04/2014  . COPD exacerbation ([place])   . Alcohol abuse   . CAP (community acquired pneumonia) 08/14/2014  . Pain of left lower extremity- and Right 11/23/2013  . Fatigue 08/23/2013  . Aftercare following surgery of the circulatory system, [place] 02/23/2013  . Peripheral vascular disease, unspecified ([place]) 01/26/2013  . COPD (chronic obstructive pulmonary disease) ([place]) 12/24/2012  . Claudication in peripheral vascular disease ([place]) 11/27/2012  . PVD-Hx of BFBPG '99 and '00. Dopplers OK 6/13   . Cancer of base of tongue- radiation 2010   . Hx of radiation therapy   . DM (diabetes mellitus), type 2, uncontrolled ([place]) 07/23/2011  . TIA (transient ischemic attack)-2013 07/23/2011  . History of CVA (cerebrovascular accident) 07/23/2011  . Tobacco abuse 07/23/2011  . Hyperlipidemia with target LDL less than 70 07/23/2011  . CAD: Stents in p-mRCA, p & m Cx, p-mLAD 02/01/1998    Past Surgical History:  Procedure Laterality Date  . ANKLE FRACTURE SURGERY Right 2007   "got a metal plate and 8 screws in it" (11/26/2012)  . APPENDECTOMY  1962  . BYPASS GRAFT POPLITEAL TO POPLITEAL Right 02/05/2013   Procedure: BYPASS GRAFT ABOVE KNEE POPLITEAL TO BELOW KNEE POPLITEAL WITH SMALL SAPHANOUS VEIN;  Surgeon: Rosetta Posner, MD;  Location: [facility];  Service: Vascular;  Laterality: Right;  . CARDIAC CATHETERIZATION  1999; 10/2005   5/'07: mildLAD 20-30%, patent stent, diags- no Dz. RCA 2 BMS patent ~20% ISR.   Marland Kitchen CARDIAC CATHETERIZATION N/A 10/06/2015   Procedure: Left Heart Cath and Coronary Angiography;  Surgeon: Leonie Man, MD;  Location: [place] [facility] CV LAB;  Service: Cardiovascular; p-mLAD 99% pre-stent, 60% ISR & 80% post-stent, o-RPDA 90%, ~10% ISR in 2 RCA (Prox & mid) BMS,~20% ISR in pCx BMS with 5% OM2 BMS.    Marland Kitchen CARDIAC CATHETERIZATION N/A 10/06/2015   Procedure: Coronary Stent Intervention;   Surgeon: Leonie Man, MD;  Location: [facility];  Service: Cardiovascular; p-m LAD Promus DES 2.75 x 38 (tapered post-dilatoin)  . CARDIAC CATHETERIZATION N/A 10/06/2015   Procedure: Coronary Balloon Angioplasty;  Surgeon: Leonie Man, MD;  Location: [facility] CV LAB;  Service: Cardiovascular;  POBA of Ostial rPDA.  Marland Kitchen CARPAL TUNNEL RELEASE Right 1980's?  Marland Kitchen CATARACT EXTRACTION W/ INTRAOCULAR LENS IMPLANT Right 2011  . [place]  . CORONARY ANGIOPLASTY WITH STENT PLACEMENT  1999--2005   1999 - OM2 BMS; 2001: p-mCx NIR BMS 3.5 x 12; 10/2001: p-mRCA Express II BMS - 3.5 x 16 - 3.0 x 24.   . ESOPHAGOGASTRODUODENOSCOPY  12/20/2011   Procedure: ESOPHAGOGASTRODUODENOSCOPY (EGD);  Surgeon: Beryle Beams, MD;  Location: [facility];  Service: Endoscopy;  Laterality: N/A;  EGD with balloon dilation  . EYE SURGERY    . FEMORAL BYPASS  1999; 2000   Bilateral Fem-AK POP bypass.   Everlean Alstrom GENERIC  HISTORICAL  06/10/2016   IR KYPHO EA ADDL LEVEL THORACIC OR LUMBAR 06/10/2016 Sandi Mariscal, MD [facility]  . IR GENERIC HISTORICAL  06/10/2016   IR KYPHO THORACIC WITH BONE BIOPSY 06/10/2016 Sandi Mariscal, MD [facility]  . IR GENERIC HISTORICAL  06/27/2016   IR RADIOLOGIST EVAL & MGMT 06/27/2016 Sandi Mariscal, MD [facility]  . PERIPHERAL VASCULAR CATHETERIZATION N/A 12/01/2015   Procedure: Abdominal Aortogram w/Lower Extremity;  Surgeon: Elam Dutch, MD;  Location: [facility];  Service: Cardiovascular;  Laterality: N/A;  . PERIPHERAL VASCULAR CATHETERIZATION Right 12/22/2015   Procedure: Peripheral Vascular Intervention;  Surgeon: Rosetta Posner, MD;  Location: [facility] CV LAB;  Service: Cardiovascular;  Laterality: Right;  EXternal iliac  . SAVORY DILATION  12/20/2011   Procedure: SAVORY DILATION;  Surgeon: Beryle Beams, MD;  Location: [facility];  Service: Endoscopy;  Laterality: N/A;  . TEE WITHOUT CARDIOVERSION  07/26/2011   Procedure: TRANSESOPHAGEAL ECHOCARDIOGRAM (TEE);   Surgeon: Pixie Casino, MD;  Location[facility];  Service: Cardiovascular;  Laterality: N/A;  . TONSILLECTOMY  1969  . TRANSTHORACIC ECHOCARDIOGRAM  March 2016   Normal LV size and function EF 55-60%. No regional wall motion and amount is. No significant valvular lesions.  . TUBAL LIGATION  1988  . VAGINAL HYSTERECTOMY  1988    OB History    No data available       Home Medications    Prior to Admission medications   Medication Sig Start Date End Date Taking? Authorizing Provider  acetaminophen (TYLENOL) 325 MG tablet Take 2 tablets (650 mg total) by mouth every 6 (six) hours as needed. Patient taking differently: Take 650 mg by mouth every 6 (six) hours as needed for mild pain.  05/01/16   [name], PA-C  albuterol (PROVENTIL HFA;VENTOLIN HFA) 108 (90 BASE) MCG/ACT inhaler Inhale 2 puffs into the lungs every 6 (six) hours as needed for wheezing or shortness of breath. 01/20/15   Belkys A Regalado, MD  amoxicillin-clavulanate (AUGMENTIN) 250-62.5 MG/5ML suspension Take 17.5 mLs (875 mg total) by mouth every 12 (twelve) hours. 06/11/16   Charlynne Cousins, MD  aspirin EC 81 MG tablet Take 1 tablet (81 mg total) by mouth daily. 10/07/15   Thurnell Lose, MD  bisoprolol (ZEBETA) 5 MG tablet Take 0.5 tablets (2.5 mg total) by mouth daily. 05/15/16   Orson Eva, MD  BREO ELLIPTA 100-25 MCG/INH AEPB INHALE ONE PUFF BY MOUTH ONCE DAILY 08/01/16   Tammy S Parrett, NP  buPROPion (WELLBUTRIN XL) 300 MG 24 hr tablet Take 300 mg by mouth daily.  09/19/15   Historical Provider, MD  clopidogrel (PLAVIX) 75 MG tablet Take 1 tablet (75 mg total) by mouth daily. Please don't take plavix until you have kyphoplasty. May resume after the procedure, please talk your PCP. 06/12/16   Charlynne Cousins, MD  cyclobenzaprine (FLEXERIL) 5 MG tablet Take 1-2 tablets (5-10 mg total) by mouth 3 (three) times daily as needed for muscle spasms. 05/09/16   Leandrew Koyanagi, MD  feeding supplement, GLUCERNA SHAKE,  (GLUCERNA SHAKE) LIQD Take 237 mLs by mouth 2 (two) times daily between meals. 04/09/16   Donita Brooks, NP  fish oil-omega-3 fatty acids 1000 MG capsule Take 1 g by mouth 2 (two) times daily.     Historical Provider, MD  fluticasone (FLONASE) 50 MCG/ACT nasal spray Place 2 sprays into both nostrils daily. 06/02/15   Debbe Odea, MD  guaiFENesin ([place]) 600 MG 12  hr tablet Take 1,200 mg by mouth 2 (two) times daily.    Historical Provider, MD  ibuprofen (ADVIL,MOTRIN) 200 MG tablet Take 400 mg by mouth every 6 (six) hours as needed (Pain).    Historical Provider, MD  insulin glargine (LANTUS) 100 UNIT/ML injection Inject 0.1 mLs (10 Units total) into the skin at bedtime. Patient taking differently: Inject 10-20 Units into the skin at bedtime.  05/14/16   Orson Eva, MD  insulin lispro (HUMALOG) 100 UNIT/ML injection Inject 3 Units into the skin 3 (three) times daily with meals as needed for high blood sugar.     Historical Provider, MD  ipratropium-albuterol (DUONEB) 0.5-2.5 (3) MG/3ML SOLN Take 3 mLs by nebulization every 6 (six) hours. Patient taking differently: Take 3 mLs by nebulization 2 (two) times daily as needed (for wheezingt or shortness of breath).  06/02/15   Debbe Odea, MD  loratadine (CLARITIN) 10 MG tablet Take 1 tablet (10 mg total) by mouth daily. 06/02/15   Debbe Odea, MD  Maltodextrin-Xanthan Gum (Alfalfa) POWD Use with thin liquids to make nectar thickened consistency 05/14/16   Orson Eva, MD  nitroGLYCERIN (NITROLINGUAL) 0.4 MG/SPRAY spray Place 1 spray under the tongue every 5 (five) minutes as needed for chest pain. 01/20/15   Belkys A Regalado, MD  omeprazole (PRILOSEC) 20 MG capsule Take 20 mg by mouth daily.     Historical Provider, MD  oxyCODONE-acetaminophen (PERCOCET/ROXICET) 5-325 MG tablet Take 1 tablet by mouth every 6 (six) hours as needed for moderate pain or severe pain. 06/11/16   Charlynne Cousins, MD  polyethylene glycol powder  (GLYCOLAX/MIRALAX) powder Take 17 g by mouth 2 (two) times daily. Patient taking differently: Take 17 g by mouth 2 (two) times daily as needed for moderate constipation.  05/01/16   [name], PA-C  rosuvastatin (CRESTOR) 20 MG tablet Take 20 mg by mouth daily.    Historical Provider, MD  SPIRIVA RESPIMAT 2.5 MCG/ACT AERS Take 2 puffs by mouth daily.  10/24/15   Historical Provider, MD  SPIRIVA RESPIMAT 2.5 MCG/ACT AERS INHALE TWO SPRAY(S) BY MOUTH ONCE DAILY 08/01/16   Tammy S Parrett, NP  traMADol (ULTRAM) 50 MG tablet Take 1 tablet (50 mg total) by mouth every 6 (six) hours as needed for moderate pain or severe pain. 05/09/16   Naiping Ephriam Jenkins, MD    Family History Family History  Problem Relation Age of Onset  . Cancer Mother     pancreatic  . Diabetes Mother   . Hyperlipidemia Mother   . Hypertension Mother   . Other Mother     varicose vein  . Cancer Father 72    throat  . Heart disease Father   . Heart attack Father   . Cancer Sister     breast  . Diabetes Sister   . Deep vein thrombosis Brother   . Diabetes Brother   . Hearing loss Brother   . Hyperlipidemia Brother   . Hypertension Brother   . Heart attack Brother   . Clotting disorder Brother   . Diabetes Son   . Hyperlipidemia Son     Social History Social History  Substance Use Topics  . Smoking status: Former Smoker    Packs/day: 1.00    Years: 50.00    Types: Cigarettes    Quit date: 04/03/2016  . Smokeless tobacco: Never Used     Comment: up to 0.5 ppd 02/13/16  . Alcohol use Yes     Comment: occ  Allergies   Tape; Isosorbide; and Nicoderm [nicotine]   Review of Systems Review of Systems  Constitutional: Positive for chills, fatigue and fever. Negative for activity change and diaphoresis.  HENT: Negative for congestion and rhinorrhea.   Eyes: Negative for visual disturbance.  Respiratory: Positive for cough. Negative for chest tightness, shortness of breath and stridor.   Cardiovascular:  Positive for chest pain. Negative for palpitations and leg swelling.  Gastrointestinal: Positive for nausea. Negative for abdominal distention, abdominal pain, constipation, diarrhea and vomiting.  Genitourinary: Negative for difficulty urinating, dysuria, flank pain, frequency, hematuria, menstrual problem and pelvic pain.  Musculoskeletal: Negative for back pain and neck pain.  Skin: Negative for rash and wound.  Neurological: Positive for dizziness. Negative for weakness, light-headedness, numbness and headaches.  Psychiatric/Behavioral: Negative for agitation and confusion.  All other systems reviewed and are negative.    Physical Exam Updated Vital Signs BP 135/70   Pulse 112   Temp 97.7 F (36.5 C) (Oral)   Resp 25   LMP  (LMP Unknown)   SpO2 93%   Physical Exam  Constitutional: She is oriented to person, place, and time. She appears well-developed and well-nourished. She appears distressed.  HENT:  Head: Normocephalic and atraumatic.  Right Ear: External ear normal.  Left Ear: External ear normal.  Nose: Nose normal.  Mouth/Throat: Oropharynx is clear and moist. No oropharyngeal exudate.  Eyes: Conjunctivae and EOM are normal. Pupils are equal, round, and reactive to light.  Neck: Normal range of motion. Neck supple.  Cardiovascular: Normal rate, regular rhythm, normal heart sounds and intact distal pulses.   Pulmonary/Chest: No stridor. Tachypnea noted. She is in respiratory distress. She has rhonchi. She exhibits no tenderness.  Abdominal: Soft. Bowel sounds are normal. She exhibits no distension. There is no tenderness. There is no rebound.  Musculoskeletal: She exhibits no tenderness.  Neurological: She is alert and oriented to person, place, and time. She has normal reflexes. No cranial nerve deficit or sensory deficit. She exhibits normal muscle tone. Coordination normal. GCS eye subscore is 4. GCS verbal subscore is 5. GCS motor subscore is 6.  LUE weakness at her  baseline  Skin: Skin is warm. Capillary refill takes less than 2 seconds. No rash noted. She is not diaphoretic. No erythema.  Psychiatric: She has a normal mood and affect.  Nursing note and vitals reviewed.    ED Treatments / Results  Labs (all labs ordered are listed, but only abnormal results are displayed) Labs Reviewed  COMPREHENSIVE METABOLIC PANEL - Abnormal; Notable for the following:       Result Value   Sodium 126 (*)    Chloride 87 (*)    Glucose, Bld 217 (*)    BUN 28 (*)    Albumin 2.7 (*)    Alkaline Phosphatase 215 (*)    Total Bilirubin 1.3 (*)    All other components within normal limits  CBC WITH DIFFERENTIAL/PLATELET - Abnormal; Notable for the following:    WBC 21.9 (*)    Neutro Abs 20.5 (*)    All other components within normal limits  URINALYSIS, ROUTINE W REFLEX MICROSCOPIC - Abnormal; Notable for the following:    APPearance HAZY (*)    Glucose, UA 50 (*)    Protein, ur 100 (*)    Bacteria, UA RARE (*)    Squamous Epithelial / LPF 6-30 (*)    All other components within normal limits  BRAIN NATRIURETIC PEPTIDE - Abnormal; Notable for the following:  B Natriuretic Peptide 728.4 (*)    All other components within normal limits  BLOOD GAS, ARTERIAL - Abnormal; Notable for the following:    pO2, Arterial 76.4 (*)    Acid-Base Excess 2.4 (*)    All other components within normal limits  I-STAT CG4 LACTIC ACID, ED - Abnormal; Notable for the following:    Lactic Acid, Venous 2.31 (*)    All other components within normal limits  CBG MONITORING, ED - Abnormal; Notable for the following:    Glucose-Capillary 219 (*)    All other components within normal limits  CULTURE, BLOOD (ROUTINE X 2)  CULTURE, BLOOD (ROUTINE X 2)  RESPIRATORY PANEL BY PCR  CULTURE, BLOOD (ROUTINE X 2)  CULTURE, BLOOD (ROUTINE X 2)  GRAM STAIN  CULTURE, EXPECTORATED SPUTUM-ASSESSMENT  PROTIME-INR  MAGNESIUM  TSH  HIV ANTIBODY (ROUTINE TESTING)  STREP PNEUMONIAE URINARY  ANTIGEN  LEGIONELLA PNEUMOPHILA SEROGP 1 UR AG  HEMOGLOBIN A1C  COMPREHENSIVE METABOLIC PANEL  CBC  I-STAT TROPOININ, ED  I-STAT CG4 LACTIC ACID, ED  I-STAT TROPOININ, ED    EKG  EKG Interpretation  Date/Time:  Tuesday August 06 2016 12:22:40 EST Ventricular Rate:  106 PR Interval:    QRS Duration: 80 QT Interval:  327 QTC Calculation: 435 R Axis:   19 Text Interpretation:  Sinus tachycardia Probable left atrial enlargement Borderline low voltage, extremity leads When compared to prior, no significant changes seen.  No STEMI Confirmed by Sherry Ruffing MD, CHRISTOPHER [phone]) on 08/06/2016 12:40:34 PM       Radiology Ct Angio Chest Pe W And/or Wo Contrast  Result Date: 08/06/2016 CLINICAL DATA:  Increase weakness with chest pain and fever. EXAM: CT ANGIOGRAPHY CHEST WITH CONTRAST TECHNIQUE: Multidetector CT imaging of the chest was performed using the standard protocol during bolus administration of intravenous contrast. Multiplanar CT image reconstructions and MIPs were obtained to evaluate the vascular anatomy. CONTRAST:  100 cc Isovue 370 COMPARISON:  05/09/2016 FINDINGS: Cardiovascular: Satisfactory opacification of the pulmonary arteries to the segmental level. No evidence of pulmonary embolism. Normal heart size. No pericardial effusion. Mediastinum/Nodes: Mild mediastinal lymphadenopathy. No hilar lymphadenopathy. The esophagus has normal imaging features. There is no axillary lymphadenopathy. Lungs/Pleura: Moderate changes of emphysema. There is right lower lobe consolidation with left lower lobe consolidative changes to a more modest degree. Scattered areas of cavitation within the right lower lobe airspace disease may be related to necrosis small right pleural effusion noted. Upper Abdomen: Unremarkable. Musculoskeletal: Bone windows reveal no worrisome lytic or sclerotic osseous lesions. Patient is status post thoracic vertebral augmentation at 2 levels. Review of the MIP images confirms  the above findings. IMPRESSION: 1. No CT evidence for acute pulmonary embolus. 2. Dense right lower lobe airspace disease consistent with pneumonia and scattered areas of potential necrosis. 3. Dependent left lower lobe consolidation. 4. Small right pleural effusion. 5. Moderate changes of emphysema. Electronically Signed   By: Misty Stanley M.D.   On: 08/06/2016 14:12   Dg Chest Port 1 View  Result Date: 08/06/2016 CLINICAL DATA:  Chest pain, hypoxia, cough, fever. EXAM: PORTABLE CHEST 1 VIEW COMPARISON:  Radiographs of June 17, 2016. FINDINGS: The heart size and mediastinal contours are within normal limits. Atherosclerosis of thoracic aorta is noted. No pneumothorax is noted. No significant pleural effusion is noted. Left lung is clear. Increased right basilar opacity is noted concerning for worsening pneumonia. The visualized skeletal structures are unremarkable. IMPRESSION: Increased right lower lobe opacity is noted concerning for worsening pneumonia. Followup PA  and lateral chest X-ray is recommended in 3-4 weeks following trial of antibiotic therapy to ensure resolution and exclude underlying malignancy. Electronically Signed   By: Marijo Conception, M.D.   On: 08/06/2016 12:37    Procedures Procedures (including critical care time)  CRITICAL CARE Performed by: Gwenyth Allegra Tegeler Total critical care time: 45 minutes Critical care time was exclusive of separately billable procedures and treating other patients. Critical care was necessary to treat or prevent imminent or life-threatening deterioration. Critical care was time spent personally by me on the following activities: development of treatment plan with patient and/or surrogate as well as nursing, discussions with consultants, evaluation of patient's response to treatment, examination of patient, obtaining history from patient or surrogate, ordering and performing treatments and interventions, ordering and review of laboratory studies,  ordering and review of radiographic studies, pulse oximetry and re-evaluation of patient's condition.   Medications Ordered in ED Medications  vancomycin (VANCOCIN) IVPB 1000 mg/200 mL premix (1,000 mg Intravenous New Bag/Given 08/06/16 1416)  sodium chloride 0.9 % bolus 1,000 mL (1,000 mLs Intravenous New Bag/Given 08/06/16 1326)    And  sodium chloride 0.9 % bolus 500 mL (500 mLs Intravenous New Bag/Given 08/06/16 1327)    And  sodium chloride 0.9 % bolus 250 mL (not administered)  ceFEPIme (MAXIPIME) 1 g in dextrose 5 % 50 mL IVPB (not administered)  vancomycin (VANCOCIN) 500 mg in sodium chloride 0.9 % 100 mL IVPB (not administered)  acetaminophen (TYLENOL) tablet 650 mg (not administered)  ceFEPIme (MAXIPIME) 2 g in dextrose 5 % 50 mL IVPB (0 g Intravenous Stopped 08/06/16 1416)  iopamidol (ISOVUE-370) 76 % injection 100 mL (100 mLs Intravenous Contrast Given 08/06/16 1351)     Initial Impression / Assessment and Plan / ED Course  I have reviewed the triage vital signs and the nursing notes.  Pertinent labs & imaging results that were available during my care of the patient were reviewed by me and considered in my medical decision making (see chart for details).     Brenda [name] is a 66 y.o. female with a past medical history significant for coronary artery disease status post PCI, diabetes, stroke on aspirin and Plavix, COPD on 2 L nasal cannula at baseline and recent COPD exacerbation, GERD, and throat cancer who presents with 2 days of chest pain, fatigue, dizziness, productive cough, and nausea.  On arrival, patient is hypoxic with an option saturation in the low 50% range. Patient was transferred to her bed with no oxygen in place. After replacement with a nonrebreather, patient had improvement in oxygen saturations. Patient was gradually weaned from her nonrebreather back to nasal cannula without difficulty.   On exam, patient's lungs were coarse bilaterally. Patient had no chest  tenderness. Patient's abdomen nontender. Patient had no lower extremity swelling or tenderness.  Due to her subjective fevers, chills, productive cough, shortness breath, chest pain, and reports this feels the same as prior pneumonias, patient will be worked up for pneumonia. Due to history of throat cancer in the hypoxia and the 50% range, CT PE study will be ordered to look for pulmonary embolism.  PE study shows no PE but both x-ray and CT scan confirm pneumonia. Patient made a code sepsis and antibiotics were started for hospital associated pneumonia given admission in January. Patient found to have elevated white blood cell count, elevated lactic acid, and CMP shows some hyponatremia. Initial troponin negative. Blood cultures obtained and antibiotic were given. No evidence of urinary tract  infection. BNP slightly elevated at 728 however, given concern for sepsis, fluids will be given per the 30 mL/kg fluid protocol.  Due to patient's dizziness, a head CT was considered however, due to concern for pulmonary embolism and hypoxia, that scan was performed first. Virtually, due to the contrast given for the chest scan, noncontrast scan may be interfered with due to the contrast. If patient continues to have dizziness or any strokelike symptoms, patient may need further workup after admission.  Patient will be admitted for pneumonia.    Final Clinical Impressions(s) / ED Diagnoses   Final diagnoses:  Sepsis due to pneumonia [facility])    New Prescriptions New Prescriptions   No medications on file    Clinical Impression: 1. Sepsis due to pneumonia [facility])     Disposition: Admit to Hospitalist service    Courtney Paris, MD 08/06/16 [phone]

## 2016-08-06 NOTE — H&P (Signed)
[facility] History and Physical  Brenda [name]:[id number] DOB: 1951-03-04 DOA: 08/06/2016  Referring physician:   PCP: [name], MD   Chief Complaint:    HPI:   Brenda [name] is a 66 y.o. female significant past medical history of severe COPD and recurrent aspiration pneumonia in the setting of known dysphagia on home O2 at night, coronary artery disease status post PCI on plavix , tongue cancer status post radiation therapy with complaints of dysphagia and recurrent aspiration pneumonia, peripheral vascular disease, recently discharged from the hospital  on 01/9//2018 for   aspiration pneumonia, Acute respiratory failure with hypoxia due to Recurrent aspiration pneumonia presents today with her husband for chest pain x 2 days, weak, dizzy x 2 days and unable to do her ADLs , she appeared lethargic and unable to sit up right without assistance. .She denies worsening shortness of breath but is hypoxic upon arrival. Patient reports a productive cough over the last few days. She also says that she has been having some subjective fevers and chills    BP 135/70   Pulse 112       Resp 25 rectal temp of 101.2. CXR   Concerning  for worsening of PNA. Patient will be admitted for pneumonia evalaution and treatment .       Review of Systems: negative for the following  Constitutional: Positive for chills, fatigue and fever. Negative for activity change and diaphoresis.  HENT: Negative for congestion and rhinorrhea.   Eyes: Negative for visual disturbance.  Respiratory: Positive for cough. Negative for chest tightness, shortness of breath and stridor.   Cardiovascular: Positive for chest pain. Negative for palpitations and leg swelling.  Gastrointestinal: Positive for nausea. Negative for abdominal distention, abdominal pain, constipation, diarrhea and vomiting.  Genitourinary: Negative for difficulty urinating, dysuria, flank pain, frequency, hematuria, menstrual problem and  pelvic pain.  Musculoskeletal: Negative for back pain and neck pain.  Skin: Negative for rash and wound.  Neurological: Positive for dizziness. Negative for weakness, light-headedness, numbness and headaches.  Psychiatric/Behavioral: Negative for agitation and confusion.  All other systems reviewed and are negative.       Past Medical History:  Diagnosis Date  . Arthritis    "hands" (11/26/2012)  . CAD S/P percutaneous coronary angioplasty 1999; 2001;2003, 10/2014   a) BMS- mCx; b) re-do PCI for prog of Dz (NIR BMS 3.5 x 12); c) staged PCI: p-mRCA Express II BMS 3.0x24 & 3.5 x 16 --> p-mLAD Cyper DES 3.0 x 13 & PTCA of D2 (2.5 balloon); 10/2015 PCI p-m LAD Promus DES 2.75 x 38 (covers pre&post-stent ISR), POBA of ostial rPDA  . Cancer of base of tongue ([place]) 2010   "& lymph nodes @ right neck" (11/26/2012  . Carotid artery occlusion   . Chronic bronchitis ([place])   . COPD (chronic obstructive pulmonary disease) with emphysema ([place])   . Depression   . GERD (gastroesophageal reflux disease)   . H/O hiatal hernia   . History of blood transfusion   . History of DVT (deep vein thrombosis)   . Hx of radiation therapy   . Hx of radiation therapy 01/16/09 - 03/06/09   base of tongue, right neck node  . Hypercholesteremia   . Myocardial infarction 1999, 2003  . PAD (peripheral artery disease) ([name])    status post bilateral femoropopliteal bypass grafting --> Dr. Sherren Mocha Early; -  02/2013 Revision of R Fem-AKPop to BK Pop.  . Patent foramen ovale February 2013   Small. Small  PFO noted on TTE for TIA/CVA R-L + Bubble study with valsalva  . Pneumonia   . Stroke [facility])    left side weakness remains (11/26/2012)  . Transient ischemic attack (TIA) February 2013  . Type II diabetes mellitus ([place])      Past Surgical History:  Procedure Laterality Date  . ANKLE FRACTURE SURGERY Right 2007   "got a metal plate and 8 screws in it" (11/26/2012)  . APPENDECTOMY  1962  . BYPASS GRAFT POPLITEAL TO  POPLITEAL Right 02/05/2013   Procedure: BYPASS GRAFT ABOVE KNEE POPLITEAL TO BELOW KNEE POPLITEAL WITH SMALL SAPHANOUS VEIN;  Surgeon: Rosetta Posner, MD;  Location: [place];  Service: Vascular;  Laterality: Right;  . CARDIAC CATHETERIZATION  1999; 10/2005   5/'07: mildLAD 20-30%, patent stent, diags- no Dz. RCA 2 BMS patent ~20% ISR.   Marland Kitchen CARDIAC CATHETERIZATION N/A 10/06/2015   Procedure: Left Heart Cath and Coronary Angiography;  Surgeon: Leonie Man, MD;  Location: [place] [facility];  Service: Cardiovascular; p-mLAD 99% pre-stent, 60% ISR & 80% post-stent, o-RPDA 90%, ~10% ISR in 2 RCA (Prox & mid) BMS,~20% ISR in pCx BMS with 5% OM2 BMS.    Marland Kitchen CARDIAC CATHETERIZATION N/A 10/06/2015   Procedure: Coronary Stent Intervention;  Surgeon: Leonie Man, MD;  Location: [place] [facility];  Service: Cardiovascular; p-m LAD Promus DES 2.75 x 38 (tapered post-dilatoin)  . CARDIAC CATHETERIZATION N/A 10/06/2015   Procedure: Coronary Balloon Angioplasty;  Surgeon: Leonie Man, MD;  Location: [place] [facility];  Service: Cardiovascular;  POBA of Ostial rPDA.  Marland Kitchen CARPAL TUNNEL RELEASE Right 1980's?  Marland Kitchen CATARACT EXTRACTION W/ INTRAOCULAR LENS IMPLANT Right 2011  . [place]  . CORONARY ANGIOPLASTY WITH STENT PLACEMENT  1999--2005   1999 - OM2 BMS; 2001: p-mCx NIR BMS 3.5 x 12; 10/2001: p-mRCA Express II BMS - 3.5 x 16 - 3.0 x 24.   . ESOPHAGOGASTRODUODENOSCOPY  12/20/2011   Procedure: ESOPHAGOGASTRODUODENOSCOPY (EGD);  Surgeon: Beryle Beams, MD;  Location: [facility];  Service: Endoscopy;  Laterality: N/A;  EGD with balloon dilation  . EYE SURGERY    . FEMORAL BYPASS  1999; 2000   Bilateral Fem-AK POP bypass.   . IR GENERIC HISTORICAL  06/10/2016   IR KYPHO EA ADDL LEVEL THORACIC OR LUMBAR 06/10/2016 Sandi Mariscal, MD [facility]  . IR GENERIC HISTORICAL  06/10/2016   IR KYPHO THORACIC WITH BONE BIOPSY 06/10/2016 Sandi Mariscal, MD [facility]  . IR GENERIC HISTORICAL  06/27/2016   IR RADIOLOGIST EVAL &  MGMT 06/27/2016 Sandi Mariscal, MD [facility]  . PERIPHERAL VASCULAR CATHETERIZATION N/A 12/01/2015   Procedure: Abdominal Aortogram w/Lower Extremity;  Surgeon: Elam Dutch, MD;  Location: [place] [facility];  Service: Cardiovascular;  Laterality: N/A;  . PERIPHERAL VASCULAR CATHETERIZATION Right 12/22/2015   Procedure: Peripheral Vascular Intervention;  Surgeon: Rosetta Posner, MD;  Location: [place] [facility];  Service: Cardiovascular;  Laterality: Right;  EXternal iliac  . SAVORY DILATION  12/20/2011   Procedure: SAVORY DILATION;  Surgeon: Beryle Beams, MD;  Location: [facility];  Service: Endoscopy;  Laterality: N/A;  . TEE WITHOUT CARDIOVERSION  07/26/2011   Procedure: TRANSESOPHAGEAL ECHOCARDIOGRAM (TEE);  Surgeon: Pixie Casino, MD;  Location: [facility];  Service: Cardiovascular;  Laterality: N/A;  . TONSILLECTOMY  1969  . TRANSTHORACIC ECHOCARDIOGRAM  March 2016   Normal LV size and function EF 55-60%. No regional wall motion and amount is. No significant  valvular lesions.  . TUBAL LIGATION  1988  . VAGINAL HYSTERECTOMY  1988      Social History:  reports that she quit smoking about 4 months ago. Her smoking use included Cigarettes. She has a 50.00 pack-year smoking history. She has never used smokeless tobacco. She reports that she drinks alcohol. She reports that she does not use drugs.    Allergies  Allergen Reactions  . Tape Hives and Other (See Comments)    USE PAPER TAPE ONLY- Adhesive peels off skin-makes pt. Raw.  . Isosorbide Other (See Comments)    Drops BP too low  . Nicoderm [Nicotine] Rash    To the PATCH only, breakouts on skin    Family History  Problem Relation Age of Onset  . Cancer Mother     pancreatic  . Diabetes Mother   . Hyperlipidemia Mother   . Hypertension Mother   . Other Mother     varicose vein  . Cancer Father 72    throat  . Heart disease Father   . Heart attack Father   . Cancer Sister     breast  . Diabetes Sister    . Deep vein thrombosis Brother   . Diabetes Brother   . Hearing loss Brother   . Hyperlipidemia Brother   . Hypertension Brother   . Heart attack Brother   . Clotting disorder Brother   . Diabetes Son   . Hyperlipidemia Son          Prior to Admission medications   Medication Sig Start Date End Date Taking? Authorizing Provider  acetaminophen (TYLENOL) 325 MG tablet Take 2 tablets (650 mg total) by mouth every 6 (six) hours as needed. Patient taking differently: Take 650 mg by mouth every 6 (six) hours as needed for mild pain.  05/01/16  Yes [name], PA-C  aspirin EC 81 MG tablet Take 1 tablet (81 mg total) by mouth daily. 10/07/15  Yes Thurnell Lose, MD  bisoprolol (ZEBETA) 5 MG tablet Take 0.5 tablets (2.5 mg total) by mouth daily. 05/15/16  Yes David Tat, MD  BREO ELLIPTA 100-25 MCG/INH AEPB INHALE ONE PUFF BY MOUTH ONCE DAILY 08/01/16  Yes Tammy S Parrett, NP  buPROPion (WELLBUTRIN XL) 300 MG 24 hr tablet Take 300 mg by mouth daily.  09/19/15  Yes Historical Provider, MD  clopidogrel (PLAVIX) 75 MG tablet Take 1 tablet (75 mg total) by mouth daily. Please don't take plavix until you have kyphoplasty. May resume after the procedure, please talk your PCP. 06/12/16  Yes Charlynne Cousins, MD  fish oil-omega-3 fatty acids 1000 MG capsule Take 1 g by mouth 2 (two) times daily.    Yes Historical Provider, MD  ibuprofen (ADVIL,MOTRIN) 200 MG tablet Take 400 mg by mouth every 6 (six) hours as needed (Pain).   Yes Historical Provider, MD  insulin glargine (LANTUS) 100 UNIT/ML injection Inject 0.1 mLs (10 Units total) into the skin at bedtime. Patient taking differently: Inject 10-20 Units into the skin at bedtime.  05/14/16  Yes Orson Eva, MD  insulin lispro (HUMALOG) 100 UNIT/ML injection Inject 3 Units into the skin 3 (three) times daily with meals as needed for high blood sugar.    Yes Historical Provider, MD  ipratropium-albuterol (DUONEB) 0.5-2.5 (3) MG/3ML SOLN Take 3 mLs by  nebulization every 6 (six) hours. Patient taking differently: Take 3 mLs by nebulization 2 (two) times daily as needed (for wheezingt or shortness of breath).  06/02/15  Yes Saima Rizwan,  MD  SPIRIVA RESPIMAT 2.5 MCG/ACT AERS INHALE TWO SPRAY(S) BY MOUTH ONCE DAILY 08/01/16  Yes Tammy S Parrett, NP  albuterol (PROVENTIL HFA;VENTOLIN HFA) 108 (90 BASE) MCG/ACT inhaler Inhale 2 puffs into the lungs every 6 (six) hours as needed for wheezing or shortness of breath. 01/20/15   Belkys A Regalado, MD  amoxicillin-clavulanate (AUGMENTIN) 250-62.5 MG/5ML suspension Take 17.5 mLs (875 mg total) by mouth every 12 (twelve) hours. Patient not taking: Reported on 08/06/2016 06/11/16   Charlynne Cousins, MD  cyclobenzaprine (FLEXERIL) 5 MG tablet Take 1-2 tablets (5-10 mg total) by mouth 3 (three) times daily as needed for muscle spasms. 05/09/16   Leandrew Koyanagi, MD  feeding supplement, GLUCERNA SHAKE, (GLUCERNA SHAKE) LIQD Take 237 mLs by mouth 2 (two) times daily between meals. 04/09/16   Donita Brooks, NP  fluticasone (FLONASE) 50 MCG/ACT nasal spray Place 2 sprays into both nostrils daily. 06/02/15   Debbe Odea, MD  guaiFENesin (MUCINEX) 600 MG 12 hr tablet Take 1,200 mg by mouth 2 (two) times daily.    Historical Provider, MD  loratadine (CLARITIN) 10 MG tablet Take 1 tablet (10 mg total) by mouth daily. Patient not taking: Reported on 08/06/2016 06/02/15   Debbe Odea, MD  Maltodextrin-Xanthan Gum ([place]) POWD Use with thin liquids to make nectar thickened consistency 05/14/16   Orson Eva, MD  nitroGLYCERIN (NITROLINGUAL) 0.4 MG/SPRAY spray Place 1 spray under the tongue every 5 (five) minutes as needed for chest pain. 01/20/15   Belkys A Regalado, MD  omeprazole (PRILOSEC) 20 MG capsule Take 20 mg by mouth daily.     Historical Provider, MD  oxyCODONE-acetaminophen (PERCOCET/ROXICET) 5-325 MG tablet Take 1 tablet by mouth every 6 (six) hours as needed for moderate pain or severe pain. Patient  not taking: Reported on 08/06/2016 06/11/16   Charlynne Cousins, MD  polyethylene glycol powder [facility]) powder Take 17 g by mouth 2 (two) times daily. Patient taking differently: Take 17 g by mouth 2 (two) times daily as needed for moderate constipation.  05/01/16   [name], PA-C  rosuvastatin (CRESTOR) 20 MG tablet Take 20 mg by mouth daily.    Historical Provider, MD  traMADol (ULTRAM) 50 MG tablet Take 1 tablet (50 mg total) by mouth every 6 (six) hours as needed for moderate pain or severe pain. 05/09/16   Leandrew Koyanagi, MD     Physical Exam: Vitals:   08/06/16 1600 08/06/16 1642 08/06/16 1648 08/06/16 1700  BP: 122/67 (!) 77/49  101/60  Pulse: 101 101  90  Resp: '26 22  25  '$ Temp:   100.1 F (37.8 C)   TempSrc:   Rectal   SpO2: 96% 95%  95%  Weight:      Height:            Vitals:   08/06/16 1600 08/06/16 1642 08/06/16 1648 08/06/16 1700  BP: 122/67 (!) 77/49  101/60  Pulse: 101 101  90  Resp: '26 22  25  '$ Temp:   100.1 F (37.8 C)   TempSrc:   Rectal   SpO2: 96% 95%  95%  Weight:      Height:       Constitutional: She is oriented to person, place, and time. She appears well-developed and well-nourished. She appears distressed.  HENT:  Head: Normocephalic and atraumatic.  Right Ear: External ear normal.  Left Ear: External ear normal.  Nose: Nose normal.  Mouth/Throat: Oropharynx is clear and moist. No oropharyngeal exudate.  Eyes: Conjunctivae and EOM are normal. Pupils are equal, round, and reactive to light.  Neck: Normal range of motion. Neck supple.  Cardiovascular: Normal rate, regular rhythm, normal heart sounds and intact distal pulses.   Pulmonary/Chest: No stridor. Tachypnea noted. She is in respiratory distress. She has rhonchi. She exhibits no tenderness.  Abdominal: Soft. Bowel sounds are normal. She exhibits no distension. There is no tenderness. There is no rebound.  Musculoskeletal: She exhibits no tenderness.  Neurological: She is  alert and oriented to person, place, and time. She has normal reflexes. No cranial nerve deficit or sensory deficit. She exhibits normal muscle tone. Coordination normal. GCS eye subscore is 4. GCS verbal subscore is 5. GCS motor subscore is 6.  LUE weakness at her baseline  Skin: Skin is warm. Capillary refill takes less than 2 seconds. No rash noted. She is not diaphoretic. No erythema.  Psychiatric: She has a normal mood and affect.     Labs on Admission: I have personally reviewed following labs and imaging studies  CBC:  Recent Labs Lab 08/06/16 1204  WBC 21.9*  NEUTROABS 20.5*  HGB 12.5  HCT 38.4  MCV 86.5  PLT 478    Basic Metabolic Panel:  Recent Labs Lab 08/06/16 1204  NA 126*  K 4.0  CL 87*  CO2 29  GLUCOSE 217*  BUN 28*  CREATININE 0.90  CALCIUM 8.9  MG 2.0    GFR: Estimated Creatinine Clearance: 54 mL/min (by C-G formula based on SCr of 0.9 mg/dL).  Liver Function Tests:  Recent Labs Lab 08/06/16 1204  AST 23  ALT 14  ALKPHOS 215*  BILITOT 1.3*  PROT 6.5  ALBUMIN 2.7*   No results for input(s): LIPASE, AMYLASE in the last 168 hours. No results for input(s): AMMONIA in the last 168 hours.  Coagulation Profile:  Recent Labs Lab 08/06/16 1204  INR 0.90   No results for input(s): DDIMER in the last 72 hours.  Cardiac Enzymes: No results for input(s): CKTOTAL, CKMB, CKMBINDEX, TROPONINI in the last 168 hours.  BNP (last 3 results) No results for input(s): PROBNP in the last 8760 hours.  HbA1C: No results for input(s): HGBA1C in the last 72 hours. Lab Results  Component Value Date   HGBA1C 9.0 (H) 05/13/2016   HGBA1C 8.9 (H) 10/06/2015   HGBA1C 8.8 (H) 05/28/2015     CBG:  Recent Labs Lab 08/06/16 1719  GLUCAP 219*    Lipid Profile: No results for input(s): CHOL, HDL, LDLCALC, TRIG, CHOLHDL, LDLDIRECT in the last 72 hours.  Thyroid Function Tests:  Recent Labs  08/06/16 1204  TSH 3.593    Anemia Panel: No  results for input(s): VITAMINB12, FOLATE, FERRITIN, TIBC, IRON, RETICCTPCT in the last 72 hours.  Urine analysis:    Component Value Date/Time   COLORURINE YELLOW 08/06/2016 1308   APPEARANCEUR HAZY (A) 08/06/2016 1308   LABSPEC 1.019 08/06/2016 1308   PHURINE 7.0 08/06/2016 1308   GLUCOSEU 50 (A) 08/06/2016 1308   HGBUR NEGATIVE 08/06/2016 1308   BILIRUBINUR NEGATIVE 08/06/2016 1308   KETONESUR NEGATIVE 08/06/2016 1308   PROTEINUR 100 (A) 08/06/2016 1308   UROBILINOGEN 1.0 01/17/2015 1627   NITRITE NEGATIVE 08/06/2016 1308   LEUKOCYTESUR NEGATIVE 08/06/2016 1308    Sepsis Labs: '@LABRCNTIP'$ (procalcitonin:4,lacticidven:4) )No results found for this or any previous visit (from the past 240 hour(s)).       Radiological Exams on Admission: Ct Angio Chest Pe W And/or Wo Contrast  Result Date: 08/06/2016 CLINICAL DATA:  Increase  weakness with chest pain and fever. EXAM: CT ANGIOGRAPHY CHEST WITH CONTRAST TECHNIQUE: Multidetector CT imaging of the chest was performed using the standard protocol during bolus administration of intravenous contrast. Multiplanar CT image reconstructions and MIPs were obtained to evaluate the vascular anatomy. CONTRAST:  100 cc Isovue 370 COMPARISON:  05/09/2016 FINDINGS: Cardiovascular: Satisfactory opacification of the pulmonary arteries to the segmental level. No evidence of pulmonary embolism. Normal heart size. No pericardial effusion. Mediastinum/Nodes: Mild mediastinal lymphadenopathy. No hilar lymphadenopathy. The esophagus has normal imaging features. There is no axillary lymphadenopathy. Lungs/Pleura: Moderate changes of emphysema. There is right lower lobe consolidation with left lower lobe consolidative changes to a more modest degree. Scattered areas of cavitation within the right lower lobe airspace disease may be related to necrosis small right pleural effusion noted. Upper Abdomen: Unremarkable. Musculoskeletal: Bone windows reveal no worrisome lytic  or sclerotic osseous lesions. Patient is status post thoracic vertebral augmentation at 2 levels. Review of the MIP images confirms the above findings. IMPRESSION: 1. No CT evidence for acute pulmonary embolus. 2. Dense right lower lobe airspace disease consistent with pneumonia and scattered areas of potential necrosis. 3. Dependent left lower lobe consolidation. 4. Small right pleural effusion. 5. Moderate changes of emphysema. Electronically Signed   By: Misty Stanley M.D.   On: 08/06/2016 14:12   Dg Chest Port 1 View  Result Date: 08/06/2016 CLINICAL DATA:  Chest pain, hypoxia, cough, fever. EXAM: PORTABLE CHEST 1 VIEW COMPARISON:  Radiographs of June 17, 2016. FINDINGS: The heart size and mediastinal contours are within normal limits. Atherosclerosis of thoracic aorta is noted. No pneumothorax is noted. No significant pleural effusion is noted. Left lung is clear. Increased right basilar opacity is noted concerning for worsening pneumonia. The visualized skeletal structures are unremarkable. IMPRESSION: Increased right lower lobe opacity is noted concerning for worsening pneumonia. Followup PA and lateral chest X-ray is recommended in 3-4 weeks following trial of antibiotic therapy to ensure resolution and exclude underlying malignancy. Electronically Signed   By: Marijo Conception, M.D.   On: 08/06/2016 12:37   Ct Angio Chest Pe W And/or Wo Contrast  Result Date: 08/06/2016 CLINICAL DATA:  Increase weakness with chest pain and fever. EXAM: CT ANGIOGRAPHY CHEST WITH CONTRAST TECHNIQUE: Multidetector CT imaging of the chest was performed using the standard protocol during bolus administration of intravenous contrast. Multiplanar CT image reconstructions and MIPs were obtained to evaluate the vascular anatomy. CONTRAST:  100 cc Isovue 370 COMPARISON:  05/09/2016 FINDINGS: Cardiovascular: Satisfactory opacification of the pulmonary arteries to the segmental level. No evidence of pulmonary embolism. Normal  heart size. No pericardial effusion. Mediastinum/Nodes: Mild mediastinal lymphadenopathy. No hilar lymphadenopathy. The esophagus has normal imaging features. There is no axillary lymphadenopathy. Lungs/Pleura: Moderate changes of emphysema. There is right lower lobe consolidation with left lower lobe consolidative changes to a more modest degree. Scattered areas of cavitation within the right lower lobe airspace disease may be related to necrosis small right pleural effusion noted. Upper Abdomen: Unremarkable. Musculoskeletal: Bone windows reveal no worrisome lytic or sclerotic osseous lesions. Patient is status post thoracic vertebral augmentation at 2 levels. Review of the MIP images confirms the above findings. IMPRESSION: 1. No CT evidence for acute pulmonary embolus. 2. Dense right lower lobe airspace disease consistent with pneumonia and scattered areas of potential necrosis. 3. Dependent left lower lobe consolidation. 4. Small right pleural effusion. 5. Moderate changes of emphysema. Electronically Signed   By: Misty Stanley M.D.   On: 08/06/2016 14:12  Dg Chest Port 1 View  Result Date: 08/06/2016 CLINICAL DATA:  Chest pain, hypoxia, cough, fever. EXAM: PORTABLE CHEST 1 VIEW COMPARISON:  Radiographs of June 17, 2016. FINDINGS: The heart size and mediastinal contours are within normal limits. Atherosclerosis of thoracic aorta is noted. No pneumothorax is noted. No significant pleural effusion is noted. Left lung is clear. Increased right basilar opacity is noted concerning for worsening pneumonia. The visualized skeletal structures are unremarkable. IMPRESSION: Increased right lower lobe opacity is noted concerning for worsening pneumonia. Followup PA and lateral chest X-ray is recommended in 3-4 weeks following trial of antibiotic therapy to ensure resolution and exclude underlying malignancy. Electronically Signed   By: Marijo Conception, M.D.   On: 08/06/2016 12:37      EKG:   EKG  Interpretation  Date/Time:                  Tuesday August 06 2016 12:22:40 EST Ventricular Rate:   106 PR Interval:                        QRS Duration:        80 QT Interval:                      327 QTC Calculation:    435 R Axis:                         19 Text Interpretation:  Sinus tachycardia Probable left atrial enlargement Borderline low voltage, extremity leads      Assessment/Plan Active Problems:   DM (diabetes mellitus), type 2, uncontrolled ([name])   TIA (transient ischemic attack)-2013   History of CVA (cerebrovascular accident)   Tobacco abuse   Hyperlipidemia with target LDL less than 70   Claudication in peripheral vascular disease ([place])   CAD: Stents in p-mRCA, p & m Cx, p-mLAD   COPD (chronic obstructive pulmonary disease) ([place])   CAP (community acquired pneumonia)   COPD exacerbation ([place])   Alcohol abuse   Dysphagia with aspiration   Depression   Acute on chronic respiratory failure with hypoxemia ([name])   Pneumonia    Acute respiratory failure with hypoxia due to Recurrent aspiration pneumonia admit to telemetry Gentle hydration, pan culture She was started empirically on IV vancomycin and cefepime ,Cultures were done which will be followed  , urine Legionella was negative Strep Pneumo , respiratory panel deescalate abx as able. - wean o2 as able to baseline.      DM (diabetes mellitus), type 2, uncontrolled ([place]) Continue home medication.lantus  SSI A1C 9.0 on 05/13/16  Severe COPD (chronic obstructive pulmonary disease) oxygen dependant:  continue albuterol , breo , prn nebs   CAD Last cath was 10/06/15 with pLAD t mLAD stenosis, 60% instent stenosis of prior LAD stent and 80% distal to stent stenosis.,Continue plavix , bisoprolol  PAD s/p bil femoropopliteal 1999/2000 with revision of right femoropopliteal in 2014, TIA/CVA  Continue plavix  Tongue cancer with history of dysphagia, - radiation 2010 SLP eval       DVT prophylaxis:  lovenox       Code Status Orders full code       consults called:  Family Communication: Admission, patients condition and plan of care including tests being ordered have been discussed with the patient  who indicates understanding and agree with the plan and Code Status  Admission status: inpatient   Disposition  plan: Further plan will depend as patient's clinical course evolves and further radiologic and laboratory data become available. Likely home when stable   At the time of admission, it appears that the appropriate admission status for this patient is INPATIENT .Thisis judged to be reasonable and necessary in order to provide the required intensity of service to ensure the patient's safetygiven thepresenting symptoms, physical exam findings, and initial radiographic and laboratory data in the context of their chronic comorbidities.   Reyne Dumas MD [facility] Pager [phone]  If 7PM-7AM, please contact night-coverage [email or web address] Password [email or web address]  08/06/2016, 5:29 PM

## 2016-08-06 NOTE — ED Notes (Signed)
MD made aware of rectal temp

## 2016-08-07 ENCOUNTER — Inpatient Hospital Stay (HOSPITAL_COMMUNITY): Payer: Medicare Other

## 2016-08-07 ENCOUNTER — Encounter: Payer: Self-pay | Admitting: *Deleted

## 2016-08-07 DIAGNOSIS — E871 Hypo-osmolality and hyponatremia: Secondary | ICD-10-CM

## 2016-08-07 DIAGNOSIS — A403 Sepsis due to Streptococcus pneumoniae: Secondary | ICD-10-CM

## 2016-08-07 DIAGNOSIS — E43 Unspecified severe protein-calorie malnutrition: Secondary | ICD-10-CM

## 2016-08-07 DIAGNOSIS — J9621 Acute and chronic respiratory failure with hypoxia: Secondary | ICD-10-CM

## 2016-08-07 DIAGNOSIS — R7881 Bacteremia: Secondary | ICD-10-CM

## 2016-08-07 DIAGNOSIS — R1310 Dysphagia, unspecified: Secondary | ICD-10-CM

## 2016-08-07 LAB — CBC
HCT: 32.7 % — ABNORMAL LOW (ref 36.0–46.0)
Hemoglobin: 10.6 g/dL — ABNORMAL LOW (ref 12.0–15.0)
MCH: 27.6 pg (ref 26.0–34.0)
MCHC: 32.4 g/dL (ref 30.0–36.0)
MCV: 85.2 fL (ref 78.0–100.0)
PLATELETS: 178 10*3/uL (ref 150–400)
RBC: 3.84 MIL/uL — ABNORMAL LOW (ref 3.87–5.11)
RDW: 14.7 % (ref 11.5–15.5)
WBC: 13.7 10*3/uL — ABNORMAL HIGH (ref 4.0–10.5)

## 2016-08-07 LAB — COMPREHENSIVE METABOLIC PANEL
ALBUMIN: 2.1 g/dL — AB (ref 3.5–5.0)
ALT: 11 U/L — AB (ref 14–54)
AST: 18 U/L (ref 15–41)
Alkaline Phosphatase: 206 U/L — ABNORMAL HIGH (ref 38–126)
Anion gap: 7 (ref 5–15)
BILIRUBIN TOTAL: 0.8 mg/dL (ref 0.3–1.2)
BUN: 22 mg/dL — AB (ref 6–20)
CALCIUM: 8.3 mg/dL — AB (ref 8.9–10.3)
CO2: 28 mmol/L (ref 22–32)
CREATININE: 0.64 mg/dL (ref 0.44–1.00)
Chloride: 98 mmol/L — ABNORMAL LOW (ref 101–111)
GFR calc Af Amer: >60 mL/min (ref >60)
GFR calc non Af Amer: >60 mL/min (ref >60)
GLUCOSE: 84 mg/dL (ref 65–99)
Potassium: 3.3 mmol/L — ABNORMAL LOW (ref 3.5–5.1)
SODIUM: 133 mmol/L — AB (ref 135–145)
Total Protein: 5.7 g/dL — ABNORMAL LOW (ref 6.5–8.1)

## 2016-08-07 LAB — RESPIRATORY PANEL BY PCR
Adenovirus: NOT DETECTED
BORDETELLA PERTUSSIS-RVPCR: NOT DETECTED
CHLAMYDOPHILA PNEUMONIAE-RVPPCR: NOT DETECTED
CORONAVIRUS 229E-RVPPCR: NOT DETECTED
CORONAVIRUS HKU1-RVPPCR: NOT DETECTED
Coronavirus NL63: NOT DETECTED
Coronavirus OC43: NOT DETECTED
Influenza A: NOT DETECTED
Influenza B: NOT DETECTED
MYCOPLASMA PNEUMONIAE-RVPPCR: NOT DETECTED
Metapneumovirus: NOT DETECTED
Parainfluenza Virus 1: NOT DETECTED
Parainfluenza Virus 2: NOT DETECTED
Parainfluenza Virus 3: NOT DETECTED
Parainfluenza Virus 4: NOT DETECTED
Respiratory Syncytial Virus: NOT DETECTED
Rhinovirus / Enterovirus: NOT DETECTED

## 2016-08-07 LAB — BLOOD CULTURE ID PANEL (REFLEXED)
ACINETOBACTER BAUMANNII: NOT DETECTED
CANDIDA ALBICANS: NOT DETECTED
CANDIDA KRUSEI: NOT DETECTED
CANDIDA PARAPSILOSIS: NOT DETECTED
Candida glabrata: NOT DETECTED
Candida tropicalis: NOT DETECTED
ENTEROBACTERIACEAE SPECIES: NOT DETECTED
ENTEROCOCCUS SPECIES: NOT DETECTED
ESCHERICHIA COLI: NOT DETECTED
Enterobacter cloacae complex: NOT DETECTED
HAEMOPHILUS INFLUENZAE: NOT DETECTED
KLEBSIELLA OXYTOCA: NOT DETECTED
Klebsiella pneumoniae: NOT DETECTED
LISTERIA MONOCYTOGENES: NOT DETECTED
Neisseria meningitidis: NOT DETECTED
PSEUDOMONAS AERUGINOSA: NOT DETECTED
Proteus species: NOT DETECTED
STREPTOCOCCUS PYOGENES: NOT DETECTED
STREPTOCOCCUS SPECIES: DETECTED — AB
Serratia marcescens: NOT DETECTED
Staphylococcus aureus (BCID): NOT DETECTED
Staphylococcus species: NOT DETECTED
Streptococcus agalactiae: NOT DETECTED
Streptococcus pneumoniae: DETECTED — AB

## 2016-08-07 LAB — GLUCOSE, CAPILLARY
GLUCOSE-CAPILLARY: 96 mg/dL (ref 65–99)
GLUCOSE-CAPILLARY: 87 mg/dL (ref 65–99)
GLUCOSE-CAPILLARY: 118 mg/dL — AB (ref 65–99)
Glucose-Capillary: 60 mg/dL — ABNORMAL LOW (ref 65–99)
Glucose-Capillary: 105 mg/dL — ABNORMAL HIGH (ref 65–99)

## 2016-08-07 LAB — EXPECTORATED SPUTUM ASSESSMENT W REFEX TO RESP CULTURE

## 2016-08-07 LAB — LEGIONELLA PNEUMOPHILA SEROGP 1 UR AG: L. PNEUMOPHILA SEROGP 1 UR AG: NEGATIVE

## 2016-08-07 LAB — HEMOGLOBIN A1C
Hgb A1c MFr Bld: 8.4 % — ABNORMAL HIGH (ref 4.8–5.6)
Mean Plasma Glucose: 194 mg/dL

## 2016-08-07 MED ORDER — DEXTROSE 50 % IV SOLN
25.0000 mL | Freq: Once | INTRAVENOUS | Status: AC
  Administered 2016-08-07: 25 mL via INTRAVENOUS

## 2016-08-07 MED ORDER — SODIUM CHLORIDE 0.9 % IV SOLN
3.0000 g | Freq: Four times a day (QID) | INTRAVENOUS | Status: DC
  Administered 2016-08-07 – 2016-08-11 (×17): 3 g via INTRAVENOUS
  Filled 2016-08-07 (×18): qty 3

## 2016-08-07 MED ORDER — DEXTROSE 50 % IV SOLN
INTRAVENOUS | Status: AC
  Filled 2016-08-07: qty 50

## 2016-08-07 NOTE — Evaluation (Signed)
Clinical/Bedside Swallow Evaluation Patient Details  Name: Brenda [name] MRN: [id number] Date of Birth: 1951/01/25  Today's Date: 08/07/2016 Time: SLP Start Time (ACUTE ONLY): 1140 SLP Stop Time (ACUTE ONLY): 1200 SLP Time Calculation (min) (ACUTE ONLY): 20 min  Past Medical History:  Past Medical History:  Diagnosis Date  . Arthritis    "hands" (11/26/2012)  . CAD S/P percutaneous coronary angioplasty 1999; 2001;2003, 10/2014   a) BMS- mCx; b) re-do PCI for prog of Dz (NIR BMS 3.5 x 12); c) staged PCI: p-mRCA Express II BMS 3.0x24 & 3.5 x 16 --> p-mLAD Cyper DES 3.0 x 13 & PTCA of D2 (2.5 balloon); 10/2015 PCI p-m LAD Promus DES 2.75 x 38 (covers pre&post-stent ISR), POBA of ostial rPDA  . Cancer of base of tongue ([place]) 2010   "& lymph nodes @ right neck" (11/26/2012  . Carotid artery occlusion   . Chronic bronchitis ([place])   . COPD (chronic obstructive pulmonary disease) with emphysema ([place])   . Depression   . GERD (gastroesophageal reflux disease)   . H/O hiatal hernia   . History of blood transfusion   . History of DVT (deep vein thrombosis)   . Hx of radiation therapy   . Hx of radiation therapy 01/16/09 - 03/06/09   base of tongue, right neck node  . Hypercholesteremia   . Myocardial infarction 1999, 2003  . PAD (peripheral artery disease) ([name])    status post bilateral femoropopliteal bypass grafting --> Dr. Sherren Mocha Early; -  02/2013 Revision of R Fem-AKPop to BK Pop.  . Patent foramen ovale February 2013   Small. Small PFO noted on TTE for TIA/CVA R-L + Bubble study with valsalva  . Pneumonia   . Stroke [facility])    left side weakness remains (11/26/2012)  . Transient ischemic attack (TIA) February 2013  . Type II diabetes mellitus ([place])    Past Surgical History:  Past Surgical History:  Procedure Laterality Date  . ANKLE FRACTURE SURGERY Right 2007   "got a metal plate and 8 screws in it" (11/26/2012)  . APPENDECTOMY  1962  . BYPASS GRAFT POPLITEAL TO POPLITEAL Right 02/05/2013   Procedure: BYPASS GRAFT ABOVE KNEE POPLITEAL TO BELOW KNEE POPLITEAL WITH SMALL SAPHANOUS VEIN;  Surgeon: Rosetta Posner, MD;  Location: [place];  Service: Vascular;  Laterality: Right;  . CARDIAC CATHETERIZATION  1999; 10/2005   5/'07: mildLAD 20-30%, patent stent, diags- no Dz. RCA 2 BMS patent ~20% ISR.   Marland Kitchen CARDIAC CATHETERIZATION N/A 10/06/2015   Procedure: Left Heart Cath and Coronary Angiography;  Surgeon: Leonie Man, MD;  Location: [place] [facility];  Service: Cardiovascular; p-mLAD 99% pre-stent, 60% ISR & 80% post-stent, o-RPDA 90%, ~10% ISR in 2 RCA (Prox & mid) BMS,~20% ISR in pCx BMS with 5% OM2 BMS.    Marland Kitchen CARDIAC CATHETERIZATION N/A 10/06/2015   Procedure: Coronary Stent Intervention;  Surgeon: Leonie Man, MD;  Location: [place] [facility];  Service: Cardiovascular; p-m LAD Promus DES 2.75 x 38 (tapered post-dilatoin)  . CARDIAC CATHETERIZATION N/A 10/06/2015   Procedure: Coronary Balloon Angioplasty;  Surgeon: Leonie Man, MD;  Location: [place] [facility];  Service: Cardiovascular;  POBA of Ostial rPDA.  Marland Kitchen CARPAL TUNNEL RELEASE Right 1980's?  Marland Kitchen CATARACT EXTRACTION W/ INTRAOCULAR LENS IMPLANT Right 2011  . [place]  . CORONARY ANGIOPLASTY WITH STENT PLACEMENT  1999--2005   1999 - OM2 BMS; 2001: p-mCx NIR BMS 3.5 x 12; 10/2001: p-mRCA Express II BMS -  3.5 x 16 - 3.0 x 24.   . ESOPHAGOGASTRODUODENOSCOPY  12/20/2011   Procedure: ESOPHAGOGASTRODUODENOSCOPY (EGD);  Surgeon: Beryle Beams, MD;  Location: [facility];  Service: Endoscopy;  Laterality: N/A;  EGD with balloon dilation  . EYE SURGERY    . FEMORAL BYPASS  1999; 2000   Bilateral Fem-AK POP bypass.   . IR GENERIC HISTORICAL  06/10/2016   IR KYPHO EA ADDL LEVEL THORACIC OR LUMBAR 06/10/2016 Sandi Mariscal, MD [facility]  . IR GENERIC HISTORICAL  06/10/2016   IR KYPHO THORACIC WITH BONE BIOPSY 06/10/2016 Sandi Mariscal, MD [facility]  . IR GENERIC HISTORICAL  06/27/2016   IR RADIOLOGIST EVAL & MGMT 06/27/2016 Sandi Mariscal,  MD [facility]  . PERIPHERAL VASCULAR CATHETERIZATION N/A 12/01/2015   Procedure: Abdominal Aortogram w/Lower Extremity;  Surgeon: Elam Dutch, MD;  Location: [place] [facility];  Service: Cardiovascular;  Laterality: N/A;  . PERIPHERAL VASCULAR CATHETERIZATION Right 12/22/2015   Procedure: Peripheral Vascular Intervention;  Surgeon: Rosetta Posner, MD;  Location: [place] [facility];  Service: Cardiovascular;  Laterality: Right;  EXternal iliac  . SAVORY DILATION  12/20/2011   Procedure: SAVORY DILATION;  Surgeon: Beryle Beams, MD;  Location: [facility];  Service: Endoscopy;  Laterality: N/A;  . TEE WITHOUT CARDIOVERSION  07/26/2011   Procedure: TRANSESOPHAGEAL ECHOCARDIOGRAM (TEE);  Surgeon: Pixie Casino, MD;  Location: [facility];  Service: Cardiovascular;  Laterality: N/A;  . TONSILLECTOMY  1969  . TRANSTHORACIC ECHOCARDIOGRAM  March 2016   Normal LV size and function EF 55-60%. No regional wall motion and amount is. No significant valvular lesions.  . TUBAL LIGATION  1988  . VAGINAL HYSTERECTOMY  1988   HPI:  66 yo female with h/o COPD, GERD, esophageal dysmotility adm with uncontrolled DM2 - found to have pna.  Pt reports dysphagia with liquids causing her to bubble in throat and making her cough/choke.   Pt with esophageal dysmotility per 05/2016 and per [facility] 07/2015 laryngeal penetration of thin and residuals - pt was on dys3/nectar diet at home.  She reports nectar was helpful but can not state how.  Pt current with pna and desires po intake.     Assessment / Plan / Recommendation Clinical Impression  Concern for oropharyngeal dysphagia and aspiration = c/b wet voice and overt coughing = cough weak and nonproductive.  Given h/o multifactorial dysphagia and pt report of coughing/choking with liquids more than solids.  Recommend MBS to allow instrumental evaluation.     SLP Visit Diagnosis: Dysphagia, oropharyngeal phase (R13.12)    Aspiration Risk  Risk for inadequate  nutrition/hydration;Severe aspiration risk    Diet Recommendation NPO;Ice chips PRN after oral care;NPO except meds   Medication Administration: Whole meds with liquid Supervision: Full supervision/cueing for compensatory strategies Postural Changes: Seated upright at 90 degrees;Remain upright for at least 30 minutes after po intake    Other  Recommendations Oral Care Recommendations: Oral care QID   Follow up Recommendations Home health SLP      Frequency and Duration min 2x/week  2 weeks       Prognosis Prognosis for Safe Diet Advancement: [place] Date of Onset: 08/07/16 HPI: 66 yo female with h/o COPD, GERD, esophageal dysmotility adm with uncontrolled DM2 - found to have pna.  Pt reports dysphagia with liquids causing her to bubble in throat and making her cough/choke.   Pt with esophageal dysmotility per 05/2016 and per [facility]  07/2015 laryngeal penetration of thin and residuals - pt was on dys3/nectar diet at home.  She reports nectar was helpful but can not state how.  Pt current with pna and desires po intake.   Type of Study: Bedside Swallow Evaluation Diet Prior to this Study: NPO Temperature Spikes Noted: Yes Respiratory Status: Nasal cannula History of Recent Intubation: No Behavior/Cognition: Alert;Cooperative;Pleasant mood Oral Cavity Assessment: Dry Oral Care Completed by SLP: No Oral Cavity - Dentition: Dentures, top;Dentures, bottom Vision: Functional for self-feeding Self-Feeding Abilities: Able to feed self Patient Positioning: Upright in bed Baseline Vocal Quality: Hoarse;Low vocal intensity Volitional Cough: Weak Volitional Swallow: Able to elicit    Oral/Motor/Sensory Function Overall Oral Motor/Sensory Function: Generalized oral weakness   Ice Chips Ice chips: Impaired Presentation: Spoon Oral Phase Impairments: Reduced labial seal;Poor awareness of bolus Oral Phase Functional Implications: Prolonged oral transit Pharyngeal  Phase Impairments: Suspected delayed Swallow;Cough - Delayed   Thin Liquid Thin Liquid: Not tested    Nectar Thick Nectar Thick Liquid: Impaired Presentation: Cup;Self Fed Oral Phase Impairments: Poor awareness of bolus;Reduced labial seal;Reduced lingual movement/coordination Oral phase functional implications: Prolonged oral transit Pharyngeal Phase Impairments: Multiple swallows;Wet Vocal Quality;Cough - Delayed   Honey Thick Honey Thick Liquid: Not tested   Puree Puree: Within functional limits Presentation: Spoon   Solid   GO   Solid: Not tested        [name], [facility] SLP [phone]

## 2016-08-07 NOTE — Progress Notes (Signed)
[facility] PROGRESS NOTE  Brenda [name] LPF:[id number] DOB: [birth year] DOA: 08/06/2016 PCP: [name], MD  Interim summary and HPI 66 y.o.femalesignificant past medical history of severe COPD and recurrent aspiration pneumonia in the setting of known dysphagia on home O2 at night, coronary artery disease status post PCI on plavix , tongue cancer status post radiation therapy with complaints of dysphagia and recurrent aspiration pneumonia; just recently discharge from the hospital due to PNA. Who presented with worsening SOB, increase oxygen requirement, productive cough and general malaise. Found to have sepsis due to PNA again.  Assessment/Plan: 1-Sepsis due to PNA and strep pneumo bacteremia  -patient on admission with temp of 101.2, HR 112, RR 25 and hypoxic on chronic oxygen supplementation. -received IVF's and started on IV antibiotics  -will follow final cultures results -continue supportive care  2-acute on chronic resp failure with hypoxia: patient chronically on 2L [place]; now on 3-4L; on arrival to ED O2 sat was 88% -most likely due to aspiration PNA; but patient recently hospitalized -covered with broad spectrum agents (cefepime and vancomycin) -low concerns for pseudomonas infection (despite recent hospitalization); also with neg MRSA PCR; so will narrow abx's to unasyn (aiming especially for aspiration). -continue nebulizer therapy and oxygen supplementation  -will follow SPL rec's and MBS test results to advance diet -continue supportive care and weaned O2 as tolerated  3-Severe COPD: no wheezing currently -will continue albuterol, Breo and PRN nebs -continue oxygen supplementation -patient started on flutter valve and mucinex  4-DM type 2: uncontrolled with A1C 9.0 (in dec 2017) -will continue SSI and lantus -adjust hypoglycemic regimen as needed   5-hx of CAD -neg troponin -continue bisoprolol and plavix  6-hx of tongue cancer and chronic dysphagia: -will  follow SPL rec's and results of MBS -currently NPO  7-severe protein calorie malnutrition  -will ask nutritional service to assess patient's status and provide feeding supplements recommendations   8-dehydration and hyponatremia -improved with IVF's -will follow electrolytes trend  Code Status: Full Family Communication: no family at bedside  Disposition Plan: due to increase oxygen requirement and tachypnea, will keep in stepdown for close observation; will follow MBS/swallowing test and rec's from SPL. Will narrow antibiotics and follow clinical response.   Consultants:  SPL  Procedures:  See below for x-ray reports   Antibiotics:  unasyn 08/07/16  HPI/Subjective: Patient tachypneic, with complaints of SOB and coughing spells. Afebrile currently. Reports chest discomfort from coughing. Using 3-4L [place] to achieve O2 sat above 90%.  Objective: Vitals:   08/07/16 2000 08/07/16 2017  BP: (!) 133/55   Pulse: (!) 106   Resp: (!) 23   Temp:  97.4 F (36.3 C)    Intake/Output Summary (Last 24 hours) at 08/07/16 2120 Last data filed at 08/07/16 1900  Gross per 24 hour  Intake             2550 ml  Output             1350 ml  Net             1200 ml   Filed Weights   08/06/16 1206  Weight: 54.9 kg (121 lb)    Exam:   General:  Patient complaining of SOB, tachypneic and using 3-4L  (normally on 2L). Patient reports been hungry and will like something to eat. Endorses chest discomfort from coughing.  Cardiovascular: slightly tachycardic, no rubs or gallops, no murmurs appreciated.  Respiratory: tachypneic, with diffuse rhonchi and rattles; no wheezing, mild use of  accessory muscles.   Abdomen: soft, NT, ND, positive BS  Musculoskeletal: no edema, no cyanosis   Data Reviewed: Basic Metabolic Panel:  Recent Labs Lab 08/06/16 1204 08/07/16 0348  NA 126* 133*  K 4.0 3.3*  CL 87* 98*  CO2 29 28  GLUCOSE 217* 84  BUN 28* 22*  CREATININE 0.90 0.64  CALCIUM  8.9 8.3*  MG 2.0  --    Liver Function Tests:  Recent Labs Lab 08/06/16 1204 08/07/16 0348  AST 23 18  ALT 14 11*  ALKPHOS 215* 206*  BILITOT 1.3* 0.8  PROT 6.5 5.7*  ALBUMIN 2.7* 2.1*   CBC:  Recent Labs Lab 08/06/16 1204 08/07/16 0348  WBC 21.9* 13.7*  NEUTROABS 20.5*  --   HGB 12.5 10.6*  HCT 38.4 32.7*  MCV 86.5 85.2  PLT 203 178   BNP (last 3 results)  Recent Labs  05/09/16 2102 08/06/16 1204  BNP 175.5* 728.4*    CBG:  Recent Labs Lab 08/06/16 2204 08/07/16 0758 08/07/16 0911 08/07/16 1236 08/07/16 1718  GLUCAP 97 60* 105* 96 87    Recent Results (from the past 240 hour(s))  Blood Culture (routine x 2)     Status: None (Preliminary result)   Collection Time: 08/06/16 12:36 PM  Result Value Ref Range Status   Specimen Description BLOOD RIGHT ANTECUBITAL  Final   Special Requests BOTTLES DRAWN AEROBIC AND ANAEROBIC 5CC  Final   Culture  Setup Time   Final    GRAM POSITIVE COCCI IN PAIRS IN BOTH AEROBIC AND ANAEROBIC BOTTLES CRITICAL VALUE NOTED.  VALUE IS CONSISTENT WITH PREVIOUSLY REPORTED AND CALLED VALUE. Performed at [facility], [place] [address], [place], [place] [address]    Culture GRAM POSITIVE COCCI  Final   Report Status PENDING  Incomplete  Blood Culture (routine x 2)     Status: None (Preliminary result)   Collection Time: 08/06/16 12:39 PM  Result Value Ref Range Status   Specimen Description BLOOD RIGHT ARM  Final   Special Requests BOTTLES DRAWN AEROBIC AND ANAEROBIC 5CC  Final   Culture  Setup Time   Final    GRAM POSITIVE COCCI IN PAIRS IN BOTH AEROBIC AND ANAEROBIC BOTTLES CRITICAL RESULT CALLED TO, READ BACK BY AND VERIFIED WITH: DReymundo Poll PHARMD, AT 2706 08/07/16 BY Rush Landmark Performed at [facility], [place] [address] [place], [place] [address]    Culture GRAM POSITIVE COCCI  Final   Report Status PENDING  Incomplete  Blood Culture ID Panel (Reflexed)     Status: Abnormal   Collection Time: 08/06/16 12:39  PM  Result Value Ref Range Status   Enterococcus species NOT DETECTED NOT DETECTED Final   Listeria monocytogenes NOT DETECTED NOT DETECTED Final   Staphylococcus species NOT DETECTED NOT DETECTED Final   Staphylococcus aureus NOT DETECTED NOT DETECTED Final   Streptococcus species DETECTED (A) NOT DETECTED Final    Comment: CRITICAL RESULT CALLED TO, READ BACK BY AND VERIFIED WITH: D. LAFFORD PHARMD, AT 8315 08/07/16 BY D. VANHOOK    Streptococcus agalactiae NOT DETECTED NOT DETECTED Final   Streptococcus pneumoniae DETECTED (A) NOT DETECTED Final    Comment: CRITICAL RESULT CALLED TO, READ BACK BY AND VERIFIED WITH: D. LAFFORD PHARMD, AT 1761 08/07/16 BY D. VANHOOK    Streptococcus pyogenes NOT DETECTED NOT DETECTED Final   Acinetobacter baumannii NOT DETECTED NOT DETECTED Final   Enterobacteriaceae species NOT DETECTED NOT DETECTED Final   Enterobacter cloacae complex NOT DETECTED  NOT DETECTED Final   Escherichia coli NOT DETECTED NOT DETECTED Final   Klebsiella oxytoca NOT DETECTED NOT DETECTED Final   Klebsiella pneumoniae NOT DETECTED NOT DETECTED Final   Proteus species NOT DETECTED NOT DETECTED Final   Serratia marcescens NOT DETECTED NOT DETECTED Final   Haemophilus influenzae NOT DETECTED NOT DETECTED Final   Neisseria meningitidis NOT DETECTED NOT DETECTED Final   Pseudomonas aeruginosa NOT DETECTED NOT DETECTED Final   Candida albicans NOT DETECTED NOT DETECTED Final   Candida glabrata NOT DETECTED NOT DETECTED Final   Candida krusei NOT DETECTED NOT DETECTED Final   Candida parapsilosis NOT DETECTED NOT DETECTED Final   Candida tropicalis NOT DETECTED NOT DETECTED Final    Comment: Performed at [facility], [place] [address], [place], [place] [address]  MRSA PCR Screening     Status: None   Collection Time: 08/06/16  8:24 PM  Result Value Ref Range Status   MRSA by PCR NEGATIVE NEGATIVE Final    Comment:        The GeneXpert MRSA Assay (FDA approved for NASAL  specimens only), is one component of a comprehensive MRSA colonization surveillance program. It is not intended to diagnose MRSA infection nor to guide or monitor treatment for MRSA infections.   Respiratory Panel by PCR     Status: None   Collection Time: 08/06/16 10:12 PM  Result Value Ref Range Status   Adenovirus NOT DETECTED NOT DETECTED Final   Coronavirus 229E NOT DETECTED NOT DETECTED Final   Coronavirus HKU1 NOT DETECTED NOT DETECTED Final   Coronavirus NL63 NOT DETECTED NOT DETECTED Final   Coronavirus OC43 NOT DETECTED NOT DETECTED Final   Metapneumovirus NOT DETECTED NOT DETECTED Final   Rhinovirus / Enterovirus NOT DETECTED NOT DETECTED Final   Influenza A NOT DETECTED NOT DETECTED Final   Influenza B NOT DETECTED NOT DETECTED Final   Parainfluenza Virus 1 NOT DETECTED NOT DETECTED Final   Parainfluenza Virus 2 NOT DETECTED NOT DETECTED Final   Parainfluenza Virus 3 NOT DETECTED NOT DETECTED Final   Parainfluenza Virus 4 NOT DETECTED NOT DETECTED Final   Respiratory Syncytial Virus NOT DETECTED NOT DETECTED Final   Bordetella pertussis NOT DETECTED NOT DETECTED Final   Chlamydophila pneumoniae NOT DETECTED NOT DETECTED Final   Mycoplasma pneumoniae NOT DETECTED NOT DETECTED Final    Comment: Performed at [facility], [place] [address], [place], [place] [address]     Studies: Ct Angio Chest Pe W And/or Wo Contrast  Result Date: 08/06/2016 CLINICAL DATA:  Increase weakness with chest pain and fever. EXAM: CT ANGIOGRAPHY CHEST WITH CONTRAST TECHNIQUE: Multidetector CT imaging of the chest was performed using the standard protocol during bolus administration of intravenous contrast. Multiplanar CT image reconstructions and MIPs were obtained to evaluate the vascular anatomy. CONTRAST:  100 cc Isovue 370 COMPARISON:  05/09/2016 FINDINGS: Cardiovascular: Satisfactory opacification of the pulmonary arteries to the segmental level. No evidence of pulmonary embolism. Normal  heart size. No pericardial effusion. Mediastinum/Nodes: Mild mediastinal lymphadenopathy. No hilar lymphadenopathy. The esophagus has normal imaging features. There is no axillary lymphadenopathy. Lungs/Pleura: Moderate changes of emphysema. There is right lower lobe consolidation with left lower lobe consolidative changes to a more modest degree. Scattered areas of cavitation within the right lower lobe airspace disease may be related to necrosis small right pleural effusion noted. Upper Abdomen: Unremarkable. Musculoskeletal: Bone windows reveal no worrisome lytic or sclerotic osseous lesions. Patient is status post thoracic vertebral augmentation at 2 levels.  Review of the MIP images confirms the above findings. IMPRESSION: 1. No CT evidence for acute pulmonary embolus. 2. Dense right lower lobe airspace disease consistent with pneumonia and scattered areas of potential necrosis. 3. Dependent left lower lobe consolidation. 4. Small right pleural effusion. 5. Moderate changes of emphysema. Electronically Signed   By: Misty Stanley M.D.   On: 08/06/2016 14:12   Dg Chest Port 1 View  Result Date: 08/06/2016 CLINICAL DATA:  Chest pain, hypoxia, cough, fever. EXAM: PORTABLE CHEST 1 VIEW COMPARISON:  Radiographs of June 17, 2016. FINDINGS: The heart size and mediastinal contours are within normal limits. Atherosclerosis of thoracic aorta is noted. No pneumothorax is noted. No significant pleural effusion is noted. Left lung is clear. Increased right basilar opacity is noted concerning for worsening pneumonia. The visualized skeletal structures are unremarkable. IMPRESSION: Increased right lower lobe opacity is noted concerning for worsening pneumonia. Followup PA and lateral chest X-ray is recommended in 3-4 weeks following trial of antibiotic therapy to ensure resolution and exclude underlying malignancy. Electronically Signed   By: Marijo Conception, M.D.   On: 08/06/2016 12:37   Dg Swallowing Func-speech  Pathology  Result Date: 08/07/2016 Objective Swallowing Evaluation: Type of Study: MBS-Modified Barium Swallow Study Patient Details Name: TAKELA VARDEN MRN: [id number] Date of Birth: 1950-06-21 Today's Date: 08/07/2016 Time: SLP Start Time (ACUTE ONLY): 1431-SLP Stop Time (ACUTE ONLY): 1505 SLP Time Calculation (min) (ACUTE ONLY): 34 min Past Medical History: Past Medical History: Diagnosis Date . Arthritis   "hands" (11/26/2012) . CAD S/P percutaneous coronary angioplasty 1999; 2001;2003, 10/2014  a) BMS- mCx; b) re-do PCI for prog of Dz (NIR BMS 3.5 x 12); c) staged PCI: p-mRCA Express II BMS 3.0x24 & 3.5 x 16 --> p-mLAD Cyper DES 3.0 x 13 & PTCA of D2 (2.5 balloon); 10/2015 PCI p-m LAD Promus DES 2.75 x 38 (covers pre&post-stent ISR), POBA of ostial rPDA . Cancer of base of tongue ([place]) 2010  "& lymph nodes @ right neck" (11/26/2012 . Carotid artery occlusion  . Chronic bronchitis ([place])  . COPD (chronic obstructive pulmonary disease) with emphysema ([place])  . Depression  . GERD (gastroesophageal reflux disease)  . H/O hiatal hernia  . History of blood transfusion  . History of DVT (deep vein thrombosis)  . Hx of radiation therapy  . Hx of radiation therapy 01/16/09 - 03/06/09  base of tongue, right neck node . Hypercholesteremia  . Myocardial infarction 1999, 2003 . PAD (peripheral artery disease) ([name])   status post bilateral femoropopliteal bypass grafting --> Dr. Sherren Mocha Early; -  02/2013 Revision of R Fem-AKPop to BK Pop. . Patent foramen ovale February 2013  Small. Small PFO noted on TTE for TIA/CVA R-L + Bubble study with valsalva . Pneumonia  . Stroke [facility])   left side weakness remains (11/26/2012) . Transient ischemic attack (TIA) February 2013 . Type II diabetes mellitus ([place])  Past Surgical History: Past Surgical History: Procedure Laterality Date . ANKLE FRACTURE SURGERY Right 2007  "got a metal plate and 8 screws in it" (11/26/2012) . APPENDECTOMY  1962 . BYPASS GRAFT POPLITEAL TO POPLITEAL Right 02/05/2013   Procedure: BYPASS GRAFT ABOVE KNEE POPLITEAL TO BELOW KNEE POPLITEAL WITH SMALL SAPHANOUS VEIN;  Surgeon: Rosetta Posner, MD;  Location: [facility];  Service: Vascular;  Laterality: Right; . CARDIAC CATHETERIZATION  1999; 10/2005  5/'07: mildLAD 20-30%, patent stent, diags- no Dz. RCA 2 BMS patent ~20% ISR.  Marland Kitchen CARDIAC CATHETERIZATION N/A 10/06/2015  Procedure: Left Heart Cath and  Coronary Angiography;  Surgeon: Leonie Man, MD;  Location: [facility];  Service: Cardiovascular; p-mLAD 99% pre-stent, 60% ISR & 80% post-stent, o-RPDA 90%, ~10% ISR in 2 RCA (Prox & mid) BMS,~20% ISR in pCx BMS with 5% OM2 BMS.   Marland Kitchen CARDIAC CATHETERIZATION N/A 10/06/2015  Procedure: Coronary Stent Intervention;  Surgeon: Leonie Man, MD;  Location: [facility];  Service: Cardiovascular; p-m LAD Promus DES 2.75 x 38 (tapered post-dilatoin) . CARDIAC CATHETERIZATION N/A 10/06/2015  Procedure: Coronary Balloon Angioplasty;  Surgeon: Leonie Man, MD;  Location: [facility];  Service: Cardiovascular;  POBA of [place] rPDA. Marland Kitchen CARPAL TUNNEL RELEASE Right 1980's? Marland Kitchen CATARACT EXTRACTION W/ INTRAOCULAR LENS IMPLANT Right 2011 . [place] . CORONARY ANGIOPLASTY WITH STENT PLACEMENT  1999--2005  1999 - OM2 BMS; 2001: p-mCx NIR BMS 3.5 x 12; 10/2001: p-mRCA Express II BMS - 3.5 x 16 - 3.0 x 24.  . ESOPHAGOGASTRODUODENOSCOPY  12/20/2011  Procedure: ESOPHAGOGASTRODUODENOSCOPY (EGD);  Surgeon: Beryle Beams, MD;  Location: [facility];  Service: Endoscopy;  Laterality: N/A;  EGD with balloon dilation . EYE SURGERY   . FEMORAL BYPASS  1999; 2000  Bilateral Fem-AK POP bypass.  . IR GENERIC HISTORICAL  06/10/2016  IR KYPHO EA ADDL LEVEL THORACIC OR LUMBAR 06/10/2016 Sandi Mariscal, MD [facility] . IR GENERIC HISTORICAL  06/10/2016  IR KYPHO THORACIC WITH BONE BIOPSY 06/10/2016 Sandi Mariscal, MD [facility] . IR GENERIC HISTORICAL  06/27/2016  IR RADIOLOGIST EVAL & MGMT 06/27/2016 Sandi Mariscal, MD [facility] . PERIPHERAL VASCULAR  CATHETERIZATION N/A 12/01/2015  Procedure: Abdominal Aortogram w/Lower Extremity;  Surgeon: Elam Dutch, MD;  Location: [facility];  Service: Cardiovascular;  Laterality: N/A; . PERIPHERAL VASCULAR CATHETERIZATION Right 12/22/2015  Procedure: Peripheral Vascular Intervention;  Surgeon: Rosetta Posner, MD;  Location: [facility];  Service: Cardiovascular;  Laterality: Right;  EXternal iliac . SAVORY DILATION  12/20/2011  Procedure: SAVORY DILATION;  Surgeon: Beryle Beams, MD;  Location: [facility];  Service: Endoscopy;  Laterality: N/A; . TEE WITHOUT CARDIOVERSION  07/26/2011  Procedure: TRANSESOPHAGEAL ECHOCARDIOGRAM (TEE);  Surgeon: Pixie Casino, MD;  Location: [facility];  Service: Cardiovascular;  Laterality: N/A; . TONSILLECTOMY  1969 . TRANSTHORACIC ECHOCARDIOGRAM  March 2016  Normal LV size and function EF 55-60%. No regional wall motion and amount is. No significant valvular lesions. . TUBAL LIGATION  1988 . VAGINAL HYSTERECTOMY  1988 HPI: 67 yo female with h/o COPD, GERD, esophageal dysmotility adm with uncontrolled DM2 - found to have pna.  Pt reports dysphagia with liquids causing her to bubble in throat and making her cough/choke.   Pt with esophageal dysmotility per 05/2016 and per [facility] 07/2015 laryngeal penetration of thin and residuals - pt was on dys3/nectar diet at home.  She reports nectar was helpful but can not state how.  Pt current with pna and desires po intake.   Subjective: pt awake in bed Assessment / Plan / Recommendation CHL IP CLINICAL IMPRESSIONS 08/07/2016 Clinical Impression Pt presents with moderate sensorimotor oropharyngeal dysphagia *also has known h/o esophageal dysmotility per esophagram 05/2016.  Consistent mild laryngeal penetration of liquids due to decreased timing and strength of laryngeal closure noted.  No aspiration noted despite pt challenged with sequential swallows of thin.  Despite chin tuck posture pt continued with laryngeal penetration and  moderate vallecular residuals.  Thin vs nectar was not significantly variable in tolerance.  Pt will be chronic aspiration risk  due to multifactorial dysphagia and least restrictive diet at this time would be full liquids with strict precautions.  Using teach back, pt educated to findings and compensation reinforcement.  SlP to follow up for dysphagia management.  SLP Visit Diagnosis Dysphagia, oropharyngeal phase (R13.12) Attention and concentration deficit following -- Frontal lobe and executive function deficit following -- Impact on safety and function Risk for inadequate nutrition/hydration;Moderate aspiration risk   CHL IP TREATMENT RECOMMENDATION 08/07/2016 Treatment Recommendations Therapy as outlined in treatment plan below   Prognosis 08/07/2016 Prognosis for Safe Diet Advancement Fair Barriers to Reach Goals Time post onset Barriers/Prognosis Comment -- CHL IP DIET RECOMMENDATION 08/07/2016 SLP Diet Recommendations -- Liquid Administration via Cup;Straw Medication Administration (No Data) Compensations Minimize environmental distractions;Slow rate;Small sips/bites;Multiple dry swallows after each bite/sip;Other (Comment) Postural Changes Remain semi-upright after after feeds/meals (Comment);Seated upright at 90 degrees   CHL IP OTHER RECOMMENDATIONS 08/07/2016 Recommended Consults -- Oral Care Recommendations Oral care BID Other Recommendations --   CHL IP FOLLOW UP RECOMMENDATIONS 08/07/2016 Follow up Recommendations Home health SLP   CHL IP FREQUENCY AND DURATION 08/07/2016 Speech Therapy Frequency (ACUTE ONLY) min 2x/week Treatment Duration 2 weeks      CHL IP ORAL PHASE 08/07/2016 Oral Phase Impaired Oral - Pudding Teaspoon -- Oral - Pudding Cup -- Oral - Honey Teaspoon -- Oral - Honey Cup -- Oral - Nectar Teaspoon -- Oral - Nectar Cup Weak lingual manipulation Oral - Nectar Straw Weak lingual manipulation Oral - Thin Teaspoon Weak lingual manipulation;Reduced posterior propulsion Oral - Thin Cup Weak lingual  manipulation;Reduced posterior propulsion;Delayed oral transit Oral - Thin Straw Weak lingual manipulation;Delayed oral transit;Reduced posterior propulsion Oral - Puree Weak lingual manipulation;Reduced posterior propulsion;Delayed oral transit Oral - Mech Soft Weak lingual manipulation;Delayed oral transit;Reduced posterior propulsion Oral - Regular -- Oral - Multi-Consistency -- Oral - Pill Weak lingual manipulation;Delayed oral transit;Reduced posterior propulsion Oral Phase - Comment --  CHL IP PHARYNGEAL PHASE 08/07/2016 Pharyngeal Phase Impaired Pharyngeal- Pudding Teaspoon -- Pharyngeal -- Pharyngeal- Pudding Cup -- Pharyngeal -- Pharyngeal- Honey Teaspoon -- Pharyngeal -- Pharyngeal- Honey Cup -- Pharyngeal -- Pharyngeal- Nectar Teaspoon Reduced pharyngeal peristalsis;Reduced airway/laryngeal closure;Reduced tongue base retraction;Pharyngeal residue - valleculae Pharyngeal -- Pharyngeal- Nectar Cup Reduced airway/laryngeal closure;Reduced tongue base retraction;Penetration/Aspiration during swallow;Pharyngeal residue - valleculae;Lateral channel residue;Reduced pharyngeal peristalsis;Reduced epiglottic inversion;Reduced anterior laryngeal mobility;Reduced laryngeal elevation Pharyngeal Material enters airway, remains ABOVE vocal cords and not ejected out Pharyngeal- Nectar Straw Reduced pharyngeal peristalsis;Reduced epiglottic inversion;Reduced anterior laryngeal mobility;Reduced laryngeal elevation;Reduced airway/laryngeal closure;Pharyngeal residue - valleculae;Reduced tongue base retraction;Penetration/Aspiration during swallow Pharyngeal Material enters airway, remains ABOVE vocal cords and not ejected out;Material enters airway, CONTACTS cords and not ejected out Pharyngeal- Thin Teaspoon Reduced pharyngeal peristalsis;Reduced epiglottic inversion;Reduced anterior laryngeal mobility;Reduced laryngeal elevation;Reduced airway/laryngeal closure;Reduced tongue base retraction;Pharyngeal residue -  valleculae;Pharyngeal residue - pyriform;Penetration/Aspiration during swallow;Penetration/Aspiration before swallow Pharyngeal Material enters airway, remains ABOVE vocal cords and not ejected out Pharyngeal- Thin Cup Reduced pharyngeal peristalsis;Reduced epiglottic inversion;Reduced anterior laryngeal mobility;Reduced laryngeal elevation;Reduced airway/laryngeal closure;Reduced tongue base retraction;Penetration/Aspiration before swallow;Penetration/Aspiration during swallow Pharyngeal Material enters airway, CONTACTS cords and not ejected out Pharyngeal- Thin Straw Reduced pharyngeal peristalsis;Reduced epiglottic inversion;Reduced anterior laryngeal mobility;Reduced laryngeal elevation;Reduced airway/laryngeal closure;Reduced tongue base retraction;Penetration/Aspiration during swallow;Penetration/Aspiration before swallow Pharyngeal Material enters airway, CONTACTS cords and not ejected out Pharyngeal- Puree Reduced pharyngeal peristalsis;Reduced epiglottic inversion;Reduced anterior laryngeal mobility;Reduced laryngeal elevation;Reduced airway/laryngeal closure;Reduced tongue base retraction;Pharyngeal residue - valleculae Pharyngeal -- Pharyngeal- Mechanical Soft Reduced tongue base retraction;Reduced laryngeal elevation;Reduced anterior laryngeal mobility;Reduced epiglottic inversion;Reduced pharyngeal peristalsis;Pharyngeal residue -  valleculae Pharyngeal -- Pharyngeal- Regular -- Pharyngeal -- Pharyngeal- Multi-consistency -- Pharyngeal -- Pharyngeal- Pill Reduced pharyngeal peristalsis;Reduced epiglottic inversion;Reduced anterior laryngeal mobility;Reduced laryngeal elevation;Reduced airway/laryngeal closure;Reduced tongue base retraction;Pharyngeal residue - valleculae Pharyngeal -- Pharyngeal Comment cued effortful swallow helpful to decrease residuals, chin tuck not consistently helpful to decrease or prevent residuals, cued and reflexive cough weak and did not clear penetrates, cued hock  intermittently effective to clear vallecular residuals, liquid swallows helpful to decrease solid residuals but pt with penetration of liquids, nectar vs thin did not appear with significant variability  CHL IP CERVICAL ESOPHAGEAL PHASE 08/07/2016 Cervical Esophageal Phase Impaired Pudding Teaspoon -- Pudding Cup -- Honey Teaspoon -- Honey Cup -- Nectar Teaspoon -- Nectar Cup -- Nectar Straw -- Thin Teaspoon -- Thin Cup -- Thin Straw -- Puree -- Mechanical Soft -- Regular -- Multi-consistency -- Pill -- Cervical Esophageal Comment pt appeared with delayed clearance of esophagus with backflow of liquid WITHOUT awareness                                                       Claudie Fisherman, MS [facility] SLP [phone]               Scheduled Meds: . ampicillin-sulbactam (UNASYN) IV  3 g Intravenous Q6H  . aspirin EC  81 mg Oral Daily  . bisoprolol  2.5 mg Oral Daily  . buPROPion  300 mg Oral Daily  . clopidogrel  75 mg Oral Daily  . enoxaparin (LOVENOX) injection  40 mg Subcutaneous Q24H  . fluticasone  2 spray Each Nare Daily  . fluticasone furoate-vilanterol  1 puff Inhalation Daily  . guaiFENesin  1,200 mg Oral BID  . insulin aspart  0-9 Units Subcutaneous TID WC  . insulin glargine  10 Units Subcutaneous QHS  . loratadine  10 mg Oral Daily  . pantoprazole  40 mg Oral Daily  . polyethylene glycol  17 g Oral BID  . rosuvastatin  20 mg Oral Daily  . sodium chloride flush  3 mL Intravenous Q12H   Continuous Infusions: . sodium chloride 100 mL/hr at 08/07/16 1830    Active Problems:   DM (diabetes mellitus), type 2, uncontrolled ([place])   TIA (transient ischemic attack)-2013   History of CVA (cerebrovascular accident)   Tobacco abuse   Hyperlipidemia with target LDL less than 70   Claudication in peripheral vascular disease ([place])   CAD: Stents in p-mRCA, p & m Cx, p-mLAD   COPD (chronic obstructive pulmonary disease) ([place])   CAP (community acquired pneumonia)   COPD  exacerbation ([place])   Alcohol abuse   Dysphagia with aspiration   Depression   Acute on chronic respiratory failure with hypoxemia ([place])   Pneumonia    Time spent: 30 minutes    Barton Dubois  [facility] Pager [phone]. If 7PM-7AM, please contact night-coverage at [email or web address], password [facility] 08/07/2016, 9:20 PM  LOS: 1 day

## 2016-08-07 NOTE — Progress Notes (Signed)
When RN was updating patient's husband about her condition during shift assessment, he happened to mention that she had a Living will at home. Per living well, patient did not want to be intubated or CPR done in the event of a code situation. RN assured the husband that she will let the medical team know if such a situation arises, but in the mean time requested that the husband bring in the Living will documents.

## 2016-08-07 NOTE — Progress Notes (Signed)
PHARMACY - PHYSICIAN COMMUNICATION CRITICAL VALUE ALERT - BLOOD CULTURE IDENTIFICATION (BCID)  Results for orders placed or performed during the hospital encounter of 08/06/16  Blood Culture ID Panel (Reflexed) (Collected: 08/06/2016 12:39 PM)  Result Value Ref Range   Enterococcus species NOT DETECTED NOT DETECTED   Listeria monocytogenes NOT DETECTED NOT DETECTED   Staphylococcus species NOT DETECTED NOT DETECTED   Staphylococcus aureus NOT DETECTED NOT DETECTED   Streptococcus species DETECTED (A) NOT DETECTED   Streptococcus agalactiae NOT DETECTED NOT DETECTED   Streptococcus pneumoniae DETECTED (A) NOT DETECTED   Streptococcus pyogenes NOT DETECTED NOT DETECTED   Acinetobacter baumannii NOT DETECTED NOT DETECTED   Enterobacteriaceae species NOT DETECTED NOT DETECTED   Enterobacter cloacae complex NOT DETECTED NOT DETECTED   Escherichia coli NOT DETECTED NOT DETECTED   Klebsiella oxytoca NOT DETECTED NOT DETECTED   Klebsiella pneumoniae NOT DETECTED NOT DETECTED   Proteus species NOT DETECTED NOT DETECTED   Serratia marcescens NOT DETECTED NOT DETECTED   Haemophilus influenzae NOT DETECTED NOT DETECTED   Neisseria meningitidis NOT DETECTED NOT DETECTED   Pseudomonas aeruginosa NOT DETECTED NOT DETECTED   Candida albicans NOT DETECTED NOT DETECTED   Candida glabrata NOT DETECTED NOT DETECTED   Candida krusei NOT DETECTED NOT DETECTED   Candida parapsilosis NOT DETECTED NOT DETECTED   Candida tropicalis NOT DETECTED NOT DETECTED    Name of physician (or Provider) Contacted: [place] Changes to prescribed antibiotics required: De-escalate to Unasyn 3gm IV q6h.    Dr Dyann Kief still wants anaerobic coverage for now despite + blood cx growing S. Pneumoniae. D/C Vancomycin as MRSA PCR negative, blood cx + S. Pneumoniae.   Biagio Borg 08/07/2016  11:56 AM

## 2016-08-07 NOTE — Progress Notes (Signed)
Modified Barium Swallow Progress Note  Patient Details  Name: Brenda [name] MRN: [id number] Date of Birth: 1950/08/23  Today's Date: 08/07/2016  Modified Barium Swallow completed.  Full report located under Chart Review in the Imaging Section.  Brief recommendations include the following:  Clinical Impression  Pt presents with moderate sensorimotor oropharyngeal dysphagia *also has known h/o esophageal dysmotility per esophagram 05/2016.  Consistent mild laryngeal penetration of liquids due to decreased timing and strength of laryngeal closure noted. Cued cough/throat clear was weak and not effective unfortunately which incr asp pneumonia risk.     No aspiration noted despite pt challenged with sequential swallows of thin.  Despite chin tuck posture pt continued with laryngeal penetration and moderate vallecular residuals.  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   Thin vs nectar was not significantly variable in tolerance.  Pt will be chronic aspiration risk due to multifactorial dysphagia and least restrictive diet at this time would be full liquids with strict precautions.  Using teach back, pt educated to findings and compensation reinforcement.    SLP to follow up for dysphagia management.    Swallow Evaluation Recommendations           Liquid Administration via: Cup;Straw   Medication Administration:  (whole with nectar)   Supervision: Full supervision/cueing for compensatory strategies   Compensations: Minimize environmental distractions;Slow rate;Small sips/bites;Multiple dry swallows after each bite/sip;Other (Comment) ("hock")  Effortful swallow   Postural Changes: Remain semi-upright after after feeds/meals (Comment);Seated upright at 90 degrees   Oral Care Recommendations: Oral care BID        [name], [place] [facility] SLP [phone]

## 2016-08-07 NOTE — Progress Notes (Signed)
Pharmacy Antibiotic Note  Brenda [name] is a 66 y.o. female presented to the ED on 08/06/16 with c/o CP.  CXR with concern for worsening of PNA.  Started on Vancomycin and Cefepime for broad coverage.   Blood cx now + S. Pneumoniae.   D/W Dr Dyann Kief.  Will narrow to Unasyn- still provide anaerobic coverage per Dr Dyann Kief request due to concern for aspiration.    Plan: Unasyn 3gm IV q6h No dose adjustments anticipated.  Pharmacy to sign off.  Please re-consult if needed.   _______________________________  Height: '5\' 5"'$  (165.1 cm) Weight: 121 lb (54.9 kg) IBW/kg (Calculated) : 57  Temp (24hrs), Avg:99.3 F (37.4 C), Min:97.7 F (36.5 C), Max:101.9 F (38.8 C)   Recent Labs Lab 08/06/16 1204 08/06/16 1214 08/06/16 1547 08/07/16 0348  WBC 21.9*  --   --  13.7*  CREATININE 0.90  --   --  0.64  LATICACIDVEN  --  2.31* 1.73  --     Estimated Creatinine Clearance: 60.8 mL/min (by C-G formula based on SCr of 0.64 mg/dL).    Allergies  Allergen Reactions  . Tape Hives and Other (See Comments)    USE PAPER TAPE ONLY- Adhesive peels off skin-makes pt. Raw.  . Isosorbide Other (See Comments)    Drops BP too low  . Nicoderm [Nicotine] Rash    To the PATCH only, breakouts on skin     Thank you for allowing pharmacy to be a part of this patient's care.  Biagio Borg 08/07/2016 8:46 AM

## 2016-08-07 NOTE — Care Management Note (Signed)
Case Management Note  Patient Details  Name: Brenda [name] MRN: [id number] Date of Birth: [birth year]  Subjective/Objective:     sepsis               Action/Plan:Date:  August 07, 2016 Chart reviewed for concurrent status and case management needs. Will continue to follow patient progress. Discharge Planning: following for needs Expected discharge date: [id number] Velva Harman, BSN, [place], [place]   Expected Discharge Date:   (unknown)               Expected Discharge Plan:  Home/Self Care  In-House Referral:     Discharge planning Services     Post Acute Care Choice:    Choice offered to:     DME Arranged:    DME Agency:     HH Arranged:    [place] Agency:     Status of Service:  In process, will continue to follow  If discussed at Long Length of Stay Meetings, dates discussed:    Additional Comments:  Leeroy Cha, RN 08/07/2016, 10:31 AM

## 2016-08-08 DIAGNOSIS — Z794 Long term (current) use of insulin: Secondary | ICD-10-CM

## 2016-08-08 DIAGNOSIS — E1165 Type 2 diabetes mellitus with hyperglycemia: Secondary | ICD-10-CM

## 2016-08-08 DIAGNOSIS — E1121 Type 2 diabetes mellitus with diabetic nephropathy: Secondary | ICD-10-CM

## 2016-08-08 DIAGNOSIS — E876 Hypokalemia: Secondary | ICD-10-CM

## 2016-08-08 DIAGNOSIS — J181 Lobar pneumonia, unspecified organism: Secondary | ICD-10-CM

## 2016-08-08 DIAGNOSIS — J42 Unspecified chronic bronchitis: Secondary | ICD-10-CM

## 2016-08-08 LAB — BASIC METABOLIC PANEL
Anion gap: 7 (ref 5–15)
BUN: 14 mg/dL (ref 6–20)
CO2: 26 mmol/L (ref 22–32)
CREATININE: 0.71 mg/dL (ref 0.44–1.00)
Calcium: 8.1 mg/dL — ABNORMAL LOW (ref 8.9–10.3)
Chloride: 102 mmol/L (ref 101–111)
GFR calc Af Amer: >60 mL/min (ref >60)
GLUCOSE: 102 mg/dL — AB (ref 65–99)
Potassium: 2.7 mmol/L — CL (ref 3.5–5.1)
SODIUM: 135 mmol/L (ref 135–145)

## 2016-08-08 LAB — GLUCOSE, CAPILLARY
GLUCOSE-CAPILLARY: 51 mg/dL — AB (ref 65–99)
GLUCOSE-CAPILLARY: 98 mg/dL (ref 65–99)
GLUCOSE-CAPILLARY: 220 mg/dL — AB (ref 65–99)
Glucose-Capillary: 65 mg/dL (ref 65–99)
Glucose-Capillary: 225 mg/dL — ABNORMAL HIGH (ref 65–99)
Glucose-Capillary: 341 mg/dL — ABNORMAL HIGH (ref 65–99)

## 2016-08-08 LAB — HIV ANTIBODY (ROUTINE TESTING W REFLEX): HIV SCREEN 4TH GENERATION: NONREACTIVE

## 2016-08-08 MED ORDER — POTASSIUM CHLORIDE CRYS ER 20 MEQ PO TBCR
40.0000 meq | EXTENDED_RELEASE_TABLET | ORAL | Status: AC
  Administered 2016-08-08 (×3): 40 meq via ORAL
  Filled 2016-08-08 (×3): qty 2

## 2016-08-08 MED ORDER — ENSURE ENLIVE PO LIQD
237.0000 mL | Freq: Two times a day (BID) | ORAL | Status: DC
  Administered 2016-08-09 – 2016-08-11 (×5): 237 mL via ORAL

## 2016-08-08 MED ORDER — INSULIN GLARGINE 100 UNIT/ML ~~LOC~~ SOLN
8.0000 [IU] | Freq: Every day | SUBCUTANEOUS | Status: DC
  Administered 2016-08-08 – 2016-08-10 (×3): 8 [IU] via SUBCUTANEOUS
  Filled 2016-08-08 (×4): qty 0.08

## 2016-08-08 MED ORDER — ADULT MULTIVITAMIN W/MINERALS CH
1.0000 | ORAL_TABLET | Freq: Every day | ORAL | Status: DC
  Administered 2016-08-08 – 2016-08-12 (×5): 1 via ORAL
  Filled 2016-08-08 (×5): qty 1

## 2016-08-08 MED ORDER — GLUCERNA SHAKE PO LIQD
237.0000 mL | Freq: Three times a day (TID) | ORAL | Status: DC
  Administered 2016-08-08: 237 mL via ORAL
  Filled 2016-08-08 (×3): qty 237

## 2016-08-08 MED ORDER — BISOPROLOL FUMARATE 5 MG PO TABS
5.0000 mg | ORAL_TABLET | Freq: Every day | ORAL | Status: DC
  Administered 2016-08-08 – 2016-08-12 (×5): 5 mg via ORAL
  Filled 2016-08-08 (×5): qty 1

## 2016-08-08 MED ORDER — MAGNESIUM SULFATE 2 GM/50ML IV SOLN
2.0000 g | Freq: Once | INTRAVENOUS | Status: AC
  Administered 2016-08-08: 2 g via INTRAVENOUS
  Filled 2016-08-08: qty 50

## 2016-08-08 NOTE — Progress Notes (Addendum)
Initial Nutrition Assessment  DOCUMENTATION CODES:   Not applicable  INTERVENTION:  - Will change oral nutrition supplement order from Glucerna Shake to Ensure Enlive BID, each supplement provides 350 kcal and 20 grams of protein.  - Continue to encourage PO intakes of meals and supplements. - RD will continue to monitor for needs.  NUTRITION DIAGNOSIS:   Swallowing difficulty related to chronic illness, cancer and cancer related treatments as evidenced by other (see comment) (PMH of tongue cancer s/p radiation).  GOAL:   Patient will meet greater than or equal to 90% of their needs  MONITOR:   PO intake, Supplement acceptance, Weight trends, Labs  REASON FOR ASSESSMENT:   Consult Assessment of nutrition requirement/status  ASSESSMENT:   66 y.o. female significant past medical history of severe COPD and recurrent aspiration pneumonia in the setting of known dysphagia on home O2 at night, coronary artery disease status post PCI on plavix , tongue cancer status post radiation therapy with complaints of dysphagia and recurrent aspiration pneumonia, peripheral vascular disease, recently discharged from the hospital  on 01/9//2018 for   aspiration pneumonia, Acute respiratory failure with hypoxia due to Recurrent aspiration pneumonia presents today with her husband for chest pain x 2 days, weak, dizzy x 2 days and unable to do her ADLs , she appeared lethargic and unable to sit up right without assistance. .She denies worsening shortness of breath but is hypoxic upon arrival. Patient reports a productive cough over the last few days. She also says that she has been having some subjective fevers and chills    Pt seen for consult. BMI indicates normal weight. Spoke with RN who reports that pt ate some grits, ice cream, and a few sips of Glucerna Shake around breakfast today. She states that pt has been feeling full quickly and that she had drank 2 cups of orange juice prior to lunch d/t  hypoglycemia. Pt with hx oc chronic dysphagia and SLP has been working with her. MBS done yesterday and FLD was recommended at that time. RN requested Glucerna be ordered d/t current diet order. Will change order from Glucerna to Ensure to optimize nutrition if intakes are minimal d/t early satiety.   SLP working with pt at time of attempted visit, RN assisting pt at time of second attempt, and pt being transferred to [place] from [place] at this time. Unable to complete physical assessment at this time but will do so at follow-up and document findings at that time. Per chart review, CBW is exactly the same (57.7 kg) as weight documented on 05/06/16. From 12/4-1/25/ pt experienced several weight fluctuations so will need to ask pt more about weight trends during follow-up. Unable to state malnutrition at this time based on limited information available.   Medications reviewed; sliding scale Novolog, 8 units Lantus/day, 2 g IV Mg sulfate x1 dose/day, 40 mg oral Protonix/day, 17 g Miralax BID, 40 mEq oral KCl every 4 hours,  Labs reviewed; CBGs: 51-220 mg/dL today, K: 2.7 mmol/L, Ca: 8.1 mg/dL. HgbA1c in December: 9%.   IVF: NS @ 100 mL/hr.    Diet Order:  Diet full liquid Room service appropriate? Yes; Fluid consistency: Thin  Skin:  Reviewed, no issues  Last BM:  PTA/unknown  Height:   Ht Readings from Last 1 Encounters:  08/06/16 '5\' 5"'$  (1.651 m)    Weight:   Wt Readings from Last 1 Encounters:  08/08/16 127 lb 3.3 oz (57.7 kg)    Ideal Body Weight:  56.82  kg  BMI:  Body mass index is 21.17 kg/m.  Estimated Nutritional Needs:   Kcal:  1440-1615 (25-28 kcal/kg)  Protein:  60-70 grams  Fluid:  >/= 1.6 L/day  EDUCATION NEEDS:   No education needs identified at this time    Jarome Matin, MS, RD, LDN, CNSC Inpatient Clinical Dietitian Pager # [phone] After hours/weekend pager # [phone]

## 2016-08-08 NOTE — Progress Notes (Signed)
Inpatient Diabetes Program Recommendations  AACE/ADA: New Consensus Statement on Inpatient Glycemic Control (2015)  Target Ranges:  Prepandial:   less than 140 mg/dL      Peak postprandial:   less than 180 mg/dL (1-2 hours)      Critically ill patients:  140 - 180 mg/dL   Lab Results  Component Value Date   GLUCAP 220 (H) 08/08/2016   HGBA1C 8.4 (H) 08/06/2016    Review of Glycemic Control  Hypoglycemia this am.  Lantus decreased from 10 to 8 units.  On Humalog 3 units tidwc at home.   Inpatient Diabetes Program Recommendations:    If post-prandial blood sugars > 180 mg/dL, add Novolog 2 units tidwc if pt    eats > 50% meal.  Continue to follow. Thank you. Lorenda Peck, RD, LDN, CDE Inpatient Diabetes Coordinator [phone]

## 2016-08-08 NOTE — Progress Notes (Signed)
  Speech Language Pathology Treatment: Dysphagia  Patient Details Name: Brenda [name] MRN: [id number] DOB: 1951/05/01 Today's Date: 08/08/2016 Time: 2536-6440 SLP Time Calculation (min) (ACUTE ONLY): 25 min  Assessment / Plan / Recommendation Clinical Impression  Pt sitting upright with family present.   Family present reports pt having problems with complaining of "getting full" stating she can't take anymore and then "vomiting".  SLP uncertain if pt with combination of pharyngeal and esophageal deficits contributing to issues however advised to maximize liquid supplement nutrition as able/tolerate and consume multiple small meals/day.  SLP showed pt and family MBS study on computer reinforcing effective compensation strategies using teach back.      Advised pt and family to importance for pt to conduct swallow exercises and follow precautions for maximal airway protection due to concerns for ongoing aspiration/dysphagia.    Family member reports pt tolerated po well while she was following her precautions and conducting swallowing exercises.  Per family/pt, pt stopped HEP when she felt stronger and ended up sick again.     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  Pt with mild dyspnea today and congested breathing.  Concern for functional reserve and pt's weight loss over time impacting her nutritional abilities.  Will follow for dysphagia management.    HPI HPI: 66 yo female with h/o COPD, GERD, esophageal dysmotility adm with uncontrolled DM2 - found to have pna.  Pt reports dysphagia with liquids causing her to bubble in throat and making her cough/choke.   Pt with esophageal dysmotility per 05/2016 and per [facility] 07/2015 laryngeal penetration of thin and residuals - pt was on dys3/nectar diet at home.  She reports nectar was helpful but can not state how.  Pt current with pna and desires po intake.        SLP Plan  Continue with current plan of care       Recommendations  Diet recommendations: Thin liquid Liquids provided via: Cup Medication Administration: Other (Comment) (whole with nectar or applesauce) Supervision: Staff to assist with self feeding Compensations: Slow rate;Small sips/bites;Multiple dry swallows after each bite/sip ("strong swallow" and hock to clear)                Oral Care Recommendations: Oral care QID Follow up Recommendations: Home health SLP SLP Visit Diagnosis: Dysphagia, oropharyngeal phase (R13.12) Plan: Continue with current plan of care       [place], [place] [facility] SLP [phone]

## 2016-08-08 NOTE — Progress Notes (Signed)
PT. Heart rate was increased ranging from 140s-160s this am for approximately 10-15 min. MD was notified. Pt. Reported feeling "tired and sleepy". RN will continue to monitor.

## 2016-08-08 NOTE — Progress Notes (Signed)
[facility] PROGRESS NOTE  THELIA TANKSLEY TMH:[id number] DOB: [birth year] DOA: 08/06/2016 PCP: [name], MD  Interim summary and HPI 66 y.o.femalesignificant past medical history of severe COPD and recurrent aspiration pneumonia in the setting of known dysphagia on home O2 at night, coronary artery disease status post PCI on plavix , tongue cancer status post radiation therapy with complaints of dysphagia and recurrent aspiration pneumonia; just recently discharge from the hospital due to PNA. Who presented with worsening SOB, increase oxygen requirement, productive cough and general malaise. Found to have sepsis due to PNA again.  Assessment/Plan: 1-Sepsis due to PNA and strep pneumo bacteremia  -patient on admission with temp of 101.2, HR 112, RR 25 and hypoxic on chronic oxygen supplementation. -received IVF's and started on IV antibiotics  -sepsis features resolving; no longer febrile, improved in WBC's and overall hemodynamically stable. -continue supportive care  2-acute on chronic resp failure with hypoxia: patient chronically on 2L [place]; now on 3-4L; on arrival to ED O2 sat was 88% -most likely due to aspiration PNA; but patient recently hospitalized -covered with broad spectrum agents (cefepime and vancomycin) -low concerns for pseudomonas infection (despite recent hospitalization); also with neg MRSA PCR; so abx's narrowed to unasyn (aiming especially for aspiration). -continue nebulizer therapy and oxygen supplementation  -following SPL rec's and MBS test results, patient started on full liquid diet -continue supportive care and weaned O2 as tolerated  3-Severe COPD: no wheezing currently -will continue albuterol, Breo and PRN nebs -continue oxygen supplementation -will continue flutter valve and mucinex  4-DM type 2: uncontrolled with A1C 9.0 (in dec 2017) -will continue SSI and lantus; last one adjusted due to hypoglycemic event -further adjustment on hypoglycemic  regimen as needed   5-hx of CAD and tachycardia -neg troponin -continue bisoprolol (dose adjusted for better control) and plavix  6-hx of tongue cancer and chronic dysphagia: -will follow SPL rec's and results of MBS -diet advance to full liquid   7-severe protein calorie malnutrition  -will ask nutritional service to assess patient's status and provide feeding supplements recommendations  -on glucerna TID between meals currently   8-dehydration, hypokalemia, hypomagnesemia and hyponatremia -improved with IVF's -will follow electrolytes trend and replete as needed   Code Status: Full Family Communication: no family at bedside  Disposition Plan: has overall remain stable from breathing stand point and not escalating to need of BIPAP or higher oxygen needs. Will transfer to telemetry. Following MBS/swallowing test and rec's from SPL started on full liquid diet. Will continue narrowed antibiotics and follow clinical response. Also electrolytes repletion and adjust hypoglycemic regimen.   Consultants:  SPL  Procedures:  See below for x-ray reports   Antibiotics:  unasyn 08/07/16  HPI/Subjective: Patient tachycardic intermittently, with complaints of SOB and coughing spells; even improved. Afebrile currently. Using 3-4L [place] to achieve O2 sat above 90%. Some hypoglycemia appreciated due to decrease intake.  Objective: Vitals:   08/08/16 0708 08/08/16 0800  BP:  (!) 158/66  Pulse:  70  Resp:  18  Temp: 97.4 F (36.3 C)     Intake/Output Summary (Last 24 hours) at 08/08/16 0925 Last data filed at 08/08/16 0800  Gross per 24 hour  Intake             2550 ml  Output             1200 ml  Net             1350 ml   Filed Weights   08/06/16  1206 08/08/16 0500  Weight: 54.9 kg (121 lb) 57.7 kg (127 lb 3.3 oz)    Exam:   General:  Patient feeling weak and tired; even expressing less coughing spells and breathing slightly better. Continue using 3-4L [place] (normally on 2L).  Patient is afebrile.  Cardiovascular: tachycardic, no rubs or gallops, no murmurs appreciated.  Respiratory: mild tachypnea, still with diffuse rhonchi and rattles; no wheezing, no using accessory muscles today. Improved air movement.   Abdomen: soft, NT, ND, positive BS  Musculoskeletal: no edema, no cyanosis, no clubbing    Data Reviewed: Basic Metabolic Panel:  Recent Labs Lab 08/06/16 1204 08/07/16 0348 08/08/16 0843  NA 126* 133* 135  K 4.0 3.3* 2.7*  CL 87* 98* 102  CO2 '29 28 26  '$ GLUCOSE 217* 84 102*  BUN 28* 22* 14  CREATININE 0.90 0.64 0.71  CALCIUM 8.9 8.3* 8.1*  MG 2.0  --   --    Liver Function Tests:  Recent Labs Lab 08/06/16 1204 08/07/16 0348  AST 23 18  ALT 14 11*  ALKPHOS 215* 206*  BILITOT 1.3* 0.8  PROT 6.5 5.7*  ALBUMIN 2.7* 2.1*   CBC:  Recent Labs Lab 08/06/16 1204 08/07/16 0348  WBC 21.9* 13.7*  NEUTROABS 20.5*  --   HGB 12.5 10.6*  HCT 38.4 32.7*  MCV 86.5 85.2  PLT 203 178   BNP (last 3 results)  Recent Labs  05/09/16 2102 08/06/16 1204  BNP 175.5* 728.4*    CBG:  Recent Labs Lab 08/07/16 1718 08/07/16 2132 08/08/16 0744 08/08/16 0824 08/08/16 0905  GLUCAP 87 118* 51* 65 98    Recent Results (from the past 240 hour(s))  Blood Culture (routine x 2)     Status: None (Preliminary result)   Collection Time: 08/06/16 12:36 PM  Result Value Ref Range Status   Specimen Description BLOOD RIGHT ANTECUBITAL  Final   Special Requests BOTTLES DRAWN AEROBIC AND ANAEROBIC 5CC  Final   Culture  Setup Time   Final    GRAM POSITIVE COCCI IN PAIRS IN BOTH AEROBIC AND ANAEROBIC BOTTLES CRITICAL VALUE NOTED.  VALUE IS CONSISTENT WITH PREVIOUSLY REPORTED AND CALLED VALUE. Performed at [facility], [place] [address], [place], [place] [address]    Culture GRAM POSITIVE COCCI  Final   Report Status PENDING  Incomplete  Blood Culture (routine x 2)     Status: None (Preliminary result)   Collection Time: 08/06/16 12:39 PM   Result Value Ref Range Status   Specimen Description BLOOD RIGHT ARM  Final   Special Requests BOTTLES DRAWN AEROBIC AND ANAEROBIC 5CC  Final   Culture  Setup Time   Final    GRAM POSITIVE COCCI IN PAIRS IN BOTH AEROBIC AND ANAEROBIC BOTTLES CRITICAL RESULT CALLED TO, READ BACK BY AND VERIFIED WITH: DReymundo Poll PHARMD, AT 9169 08/07/16 BY Rush Landmark Performed at [facility], [place] [address] [place],  [address]    Culture GRAM POSITIVE COCCI  Final   Report Status PENDING  Incomplete  Blood Culture ID Panel (Reflexed)     Status: Abnormal   Collection Time: 08/06/16 12:39 PM  Result Value Ref Range Status   Enterococcus species NOT DETECTED NOT DETECTED Final   Listeria monocytogenes NOT DETECTED NOT DETECTED Final   Staphylococcus species NOT DETECTED NOT DETECTED Final   Staphylococcus aureus NOT DETECTED NOT DETECTED Final   Streptococcus species DETECTED (A) NOT DETECTED Final    Comment: CRITICAL RESULT CALLED TO, READ  BACK BY AND VERIFIED WITH: D. LAFFORD PHARMD, AT 0737 08/07/16 BY D. VANHOOK    Streptococcus agalactiae NOT DETECTED NOT DETECTED Final   Streptococcus pneumoniae DETECTED (A) NOT DETECTED Final    Comment: CRITICAL RESULT CALLED TO, READ BACK BY AND VERIFIED WITH: D. LAFFORD PHARMD, AT 1062 08/07/16 BY D. VANHOOK    Streptococcus pyogenes NOT DETECTED NOT DETECTED Final   Acinetobacter baumannii NOT DETECTED NOT DETECTED Final   Enterobacteriaceae species NOT DETECTED NOT DETECTED Final   Enterobacter cloacae complex NOT DETECTED NOT DETECTED Final   Escherichia coli NOT DETECTED NOT DETECTED Final   Klebsiella oxytoca NOT DETECTED NOT DETECTED Final   Klebsiella pneumoniae NOT DETECTED NOT DETECTED Final   Proteus species NOT DETECTED NOT DETECTED Final   Serratia marcescens NOT DETECTED NOT DETECTED Final   Haemophilus influenzae NOT DETECTED NOT DETECTED Final   Neisseria meningitidis NOT DETECTED NOT DETECTED Final   Pseudomonas aeruginosa NOT  DETECTED NOT DETECTED Final   Candida albicans NOT DETECTED NOT DETECTED Final   Candida glabrata NOT DETECTED NOT DETECTED Final   Candida krusei NOT DETECTED NOT DETECTED Final   Candida parapsilosis NOT DETECTED NOT DETECTED Final   Candida tropicalis NOT DETECTED NOT DETECTED Final    Comment: Performed at [facility], [place]. [address] [place], [place] [address]  MRSA PCR Screening     Status: None   Collection Time: 08/06/16  8:24 PM  Result Value Ref Range Status   MRSA by PCR NEGATIVE NEGATIVE Final    Comment:        The GeneXpert MRSA Assay (FDA approved for NASAL specimens only), is one component of a comprehensive MRSA colonization surveillance program. It is not intended to diagnose MRSA infection nor to guide or monitor treatment for MRSA infections.   Respiratory Panel by PCR     Status: None   Collection Time: 08/06/16 10:12 PM  Result Value Ref Range Status   Adenovirus NOT DETECTED NOT DETECTED Final   Coronavirus 229E NOT DETECTED NOT DETECTED Final   Coronavirus HKU1 NOT DETECTED NOT DETECTED Final   Coronavirus NL63 NOT DETECTED NOT DETECTED Final   Coronavirus OC43 NOT DETECTED NOT DETECTED Final   Metapneumovirus NOT DETECTED NOT DETECTED Final   Rhinovirus / Enterovirus NOT DETECTED NOT DETECTED Final   Influenza A NOT DETECTED NOT DETECTED Final   Influenza B NOT DETECTED NOT DETECTED Final   Parainfluenza Virus 1 NOT DETECTED NOT DETECTED Final   Parainfluenza Virus 2 NOT DETECTED NOT DETECTED Final   Parainfluenza Virus 3 NOT DETECTED NOT DETECTED Final   Parainfluenza Virus 4 NOT DETECTED NOT DETECTED Final   Respiratory Syncytial Virus NOT DETECTED NOT DETECTED Final   Bordetella pertussis NOT DETECTED NOT DETECTED Final   Chlamydophila pneumoniae NOT DETECTED NOT DETECTED Final   Mycoplasma pneumoniae NOT DETECTED NOT DETECTED Final    Comment: Performed at [facility], [place] [address], [place], [place] [address]  Culture,  sputum-assessment     Status: None   Collection Time: 08/07/16 10:30 PM  Result Value Ref Range Status   Specimen Description SPUTUM  Final   Special Requests NONE  Final   Sputum evaluation THIS SPECIMEN IS ACCEPTABLE FOR SPUTUM CULTURE  Final   Report Status 08/07/2016 FINAL  Final     Studies: Ct Angio Chest Pe W And/or Wo Contrast  Result Date: 08/06/2016 CLINICAL DATA:  Increase weakness with chest pain and fever. EXAM: CT ANGIOGRAPHY CHEST WITH CONTRAST TECHNIQUE:  Multidetector CT imaging of the chest was performed using the standard protocol during bolus administration of intravenous contrast. Multiplanar CT image reconstructions and MIPs were obtained to evaluate the vascular anatomy. CONTRAST:  100 cc Isovue 370 COMPARISON:  05/09/2016 FINDINGS: Cardiovascular: Satisfactory opacification of the pulmonary arteries to the segmental level. No evidence of pulmonary embolism. Normal heart size. No pericardial effusion. Mediastinum/Nodes: Mild mediastinal lymphadenopathy. No hilar lymphadenopathy. The esophagus has normal imaging features. There is no axillary lymphadenopathy. Lungs/Pleura: Moderate changes of emphysema. There is right lower lobe consolidation with left lower lobe consolidative changes to a more modest degree. Scattered areas of cavitation within the right lower lobe airspace disease may be related to necrosis small right pleural effusion noted. Upper Abdomen: Unremarkable. Musculoskeletal: Bone windows reveal no worrisome lytic or sclerotic osseous lesions. Patient is status post thoracic vertebral augmentation at 2 levels. Review of the MIP images confirms the above findings. IMPRESSION: 1. No CT evidence for acute pulmonary embolus. 2. Dense right lower lobe airspace disease consistent with pneumonia and scattered areas of potential necrosis. 3. Dependent left lower lobe consolidation. 4. Small right pleural effusion. 5. Moderate changes of emphysema. Electronically Signed   By:  Misty Stanley M.D.   On: 08/06/2016 14:12   Dg Chest Port 1 View  Result Date: 08/06/2016 CLINICAL DATA:  Chest pain, hypoxia, cough, fever. EXAM: PORTABLE CHEST 1 VIEW COMPARISON:  Radiographs of June 17, 2016. FINDINGS: The heart size and mediastinal contours are within normal limits. Atherosclerosis of thoracic aorta is noted. No pneumothorax is noted. No significant pleural effusion is noted. Left lung is clear. Increased right basilar opacity is noted concerning for worsening pneumonia. The visualized skeletal structures are unremarkable. IMPRESSION: Increased right lower lobe opacity is noted concerning for worsening pneumonia. Followup PA and lateral chest X-ray is recommended in 3-4 weeks following trial of antibiotic therapy to ensure resolution and exclude underlying malignancy. Electronically Signed   By: Marijo Conception, M.D.   On: 08/06/2016 12:37   Dg Swallowing Func-speech Pathology  Result Date: 08/07/2016 Objective Swallowing Evaluation: Type of Study: MBS-Modified Barium Swallow Study Patient Details Name: CARLING LIBERMAN MRN: [id number] Date of Birth: 06/05/1950 Today's Date: 08/07/2016 Time: SLP Start Time (ACUTE ONLY): 1431-SLP Stop Time (ACUTE ONLY): 1505 SLP Time Calculation (min) (ACUTE ONLY): 34 min Past Medical History: Past Medical History: Diagnosis Date . Arthritis   "hands" (11/26/2012) . CAD S/P percutaneous coronary angioplasty 1999; 2001;2003, 10/2014  a) BMS- mCx; b) re-do PCI for prog of Dz (NIR BMS 3.5 x 12); c) staged PCI: p-mRCA Express II BMS 3.0x24 & 3.5 x 16 --> p-mLAD Cyper DES 3.0 x 13 & PTCA of D2 (2.5 balloon); 10/2015 PCI p-m LAD Promus DES 2.75 x 38 (covers pre&post-stent ISR), POBA of ostial rPDA . Cancer of base of tongue ([place]) 2010  "& lymph nodes @ right neck" (11/26/2012 . Carotid artery occlusion  . Chronic bronchitis ([place])  . COPD (chronic obstructive pulmonary disease) with emphysema ([facility])  . Depression  . GERD (gastroesophageal reflux disease)  . H/O hiatal  hernia  . History of blood transfusion  . History of DVT (deep vein thrombosis)  . Hx of radiation therapy  . Hx of radiation therapy 01/16/09 - 03/06/09  base of tongue, right neck node . Hypercholesteremia  . Myocardial infarction 1999, 2003 . PAD (peripheral artery disease) ([name])   status post bilateral femoropopliteal bypass grafting --> Dr. Sherren Mocha Early; -  02/2013 Revision of R Fem-AKPop to BK Pop. . Patent  foramen ovale February 2013  Small. Small PFO noted on TTE for TIA/CVA R-L + Bubble study with valsalva . Pneumonia  . Stroke [facility])   left side weakness remains (11/26/2012) . Transient ischemic attack (TIA) February 2013 . Type II diabetes mellitus ([place])  Past Surgical History: Past Surgical History: Procedure Laterality Date . ANKLE FRACTURE SURGERY Right 2007  "got a metal plate and 8 screws in it" (11/26/2012) . APPENDECTOMY  1962 . BYPASS GRAFT POPLITEAL TO POPLITEAL Right 02/05/2013  Procedure: BYPASS GRAFT ABOVE KNEE POPLITEAL TO BELOW KNEE POPLITEAL WITH SMALL SAPHANOUS VEIN;  Surgeon: Rosetta Posner, MD;  Location: [facility];  Service: Vascular;  Laterality: Right; . CARDIAC CATHETERIZATION  1999; 10/2005  5/'07: mildLAD 20-30%, patent stent, diags- no Dz. RCA 2 BMS patent ~20% ISR.  Marland Kitchen CARDIAC CATHETERIZATION N/A 10/06/2015  Procedure: Left Heart Cath and Coronary Angiography;  Surgeon: Leonie Man, MD;  Location: [facility];  Service: Cardiovascular; p-mLAD 99% pre-stent, 60% ISR & 80% post-stent, o-RPDA 90%, ~10% ISR in 2 RCA (Prox & mid) BMS,~20% ISR in pCx BMS with 5% OM2 BMS.   Marland Kitchen CARDIAC CATHETERIZATION N/A 10/06/2015  Procedure: Coronary Stent Intervention;  Surgeon: Leonie Man, MD;  Location: [facility];  Service: Cardiovascular; p-m LAD Promus DES 2.75 x 38 (tapered post-dilatoin) . CARDIAC CATHETERIZATION N/A 10/06/2015  Procedure: Coronary Balloon Angioplasty;  Surgeon: Leonie Man, MD;  Location: [facility];  Service: Cardiovascular;  POBA of Ostial rPDA. Marland Kitchen CARPAL TUNNEL  RELEASE Right 1980's? Marland Kitchen CATARACT EXTRACTION W/ INTRAOCULAR LENS IMPLANT Right 2011 . [place] . CORONARY ANGIOPLASTY WITH STENT PLACEMENT  1999--2005  1999 - OM2 BMS; 2001: p-mCx NIR BMS 3.5 x 12; 10/2001: p-mRCA Express II BMS - 3.5 x 16 - 3.0 x 24.  . ESOPHAGOGASTRODUODENOSCOPY  12/20/2011  Procedure: ESOPHAGOGASTRODUODENOSCOPY (EGD);  Surgeon: Beryle Beams, MD;  Location: [facility];  Service: Endoscopy;  Laterality: N/A;  EGD with balloon dilation . EYE SURGERY   . FEMORAL BYPASS  1999; 2000  Bilateral Fem-AK POP bypass.  . IR GENERIC HISTORICAL  06/10/2016  IR KYPHO EA ADDL LEVEL THORACIC OR LUMBAR 06/10/2016 Sandi Mariscal, MD [facility] . IR GENERIC HISTORICAL  06/10/2016  IR KYPHO THORACIC WITH BONE BIOPSY 06/10/2016 Sandi Mariscal, MD [facility] . IR GENERIC HISTORICAL  06/27/2016  IR RADIOLOGIST EVAL & MGMT 06/27/2016 Sandi Mariscal, MD [facility] . PERIPHERAL VASCULAR CATHETERIZATION N/A 12/01/2015  Procedure: Abdominal Aortogram w/Lower Extremity;  Surgeon: Elam Dutch, MD;  Location: [facility];  Service: Cardiovascular;  Laterality: N/A; . PERIPHERAL VASCULAR CATHETERIZATION Right 12/22/2015  Procedure: Peripheral Vascular Intervention;  Surgeon: Rosetta Posner, MD;  Location: [facility];  Service: Cardiovascular;  Laterality: Right;  EXternal iliac . SAVORY DILATION  12/20/2011  Procedure: SAVORY DILATION;  Surgeon: Beryle Beams, MD;  Location: [facility];  Service: Endoscopy;  Laterality: N/A; . TEE WITHOUT CARDIOVERSION  07/26/2011  Procedure: TRANSESOPHAGEAL ECHOCARDIOGRAM (TEE);  Surgeon: Pixie Casino, MD;  Location: [facility];  Service: Cardiovascular;  Laterality: N/A; . TONSILLECTOMY  1969 . TRANSTHORACIC ECHOCARDIOGRAM  March 2016  Normal LV size and function EF 55-60%. No regional wall motion and amount is. No significant valvular lesions. . TUBAL LIGATION  1988 . VAGINAL HYSTERECTOMY  1988 HPI: 66 yo female with h/o COPD, GERD, esophageal dysmotility adm with  uncontrolled DM2 - found to have pna.  Pt reports dysphagia with liquids causing her to  bubble in throat and making her cough/choke.   Pt with esophageal dysmotility per 05/2016 and per [facility] 07/2015 laryngeal penetration of thin and residuals - pt was on dys3/nectar diet at home.  She reports nectar was helpful but can not state how.  Pt current with pna and desires po intake.   Subjective: pt awake in bed Assessment / Plan / Recommendation CHL IP CLINICAL IMPRESSIONS 08/07/2016 Clinical Impression Pt presents with moderate sensorimotor oropharyngeal dysphagia *also has known h/o esophageal dysmotility per esophagram 05/2016.  Consistent mild laryngeal penetration of liquids due to decreased timing and strength of laryngeal closure noted.  No aspiration noted despite pt challenged with sequential swallows of thin.  Despite chin tuck posture pt continued with laryngeal penetration and moderate vallecular residuals.  Thin vs nectar was not significantly variable in tolerance.  Pt will be chronic aspiration risk due to multifactorial dysphagia and least restrictive diet at this time would be full liquids with strict precautions.  Using teach back, pt educated to findings and compensation reinforcement.  SlP to follow up for dysphagia management.  SLP Visit Diagnosis Dysphagia, oropharyngeal phase (R13.12) Attention and concentration deficit following -- Frontal lobe and executive function deficit following -- Impact on safety and function Risk for inadequate nutrition/hydration;Moderate aspiration risk   CHL IP TREATMENT RECOMMENDATION 08/07/2016 Treatment Recommendations Therapy as outlined in treatment plan below   Prognosis 08/07/2016 Prognosis for Safe Diet Advancement Fair Barriers to Reach Goals Time post onset Barriers/Prognosis Comment -- CHL IP DIET RECOMMENDATION 08/07/2016 SLP Diet Recommendations -- Liquid Administration via Cup;Straw Medication Administration (No Data) Compensations Minimize environmental  distractions;Slow rate;Small sips/bites;Multiple dry swallows after each bite/sip;Other (Comment) Postural Changes Remain semi-upright after after feeds/meals (Comment);Seated upright at 90 degrees   CHL IP OTHER RECOMMENDATIONS 08/07/2016 Recommended Consults -- Oral Care Recommendations Oral care BID Other Recommendations --   CHL IP FOLLOW UP RECOMMENDATIONS 08/07/2016 Follow up Recommendations Home health SLP   CHL IP FREQUENCY AND DURATION 08/07/2016 Speech Therapy Frequency (ACUTE ONLY) min 2x/week Treatment Duration 2 weeks      CHL IP ORAL PHASE 08/07/2016 Oral Phase Impaired Oral - Pudding Teaspoon -- Oral - Pudding Cup -- Oral - Honey Teaspoon -- Oral - Honey Cup -- Oral - Nectar Teaspoon -- Oral - Nectar Cup Weak lingual manipulation Oral - Nectar Straw Weak lingual manipulation Oral - Thin Teaspoon Weak lingual manipulation;Reduced posterior propulsion Oral - Thin Cup Weak lingual manipulation;Reduced posterior propulsion;Delayed oral transit Oral - Thin Straw Weak lingual manipulation;Delayed oral transit;Reduced posterior propulsion Oral - Puree Weak lingual manipulation;Reduced posterior propulsion;Delayed oral transit Oral - Mech Soft Weak lingual manipulation;Delayed oral transit;Reduced posterior propulsion Oral - Regular -- Oral - Multi-Consistency -- Oral - Pill Weak lingual manipulation;Delayed oral transit;Reduced posterior propulsion Oral Phase - Comment --  CHL IP PHARYNGEAL PHASE 08/07/2016 Pharyngeal Phase Impaired Pharyngeal- Pudding Teaspoon -- Pharyngeal -- Pharyngeal- Pudding Cup -- Pharyngeal -- Pharyngeal- Honey Teaspoon -- Pharyngeal -- Pharyngeal- Honey Cup -- Pharyngeal -- Pharyngeal- Nectar Teaspoon Reduced pharyngeal peristalsis;Reduced airway/laryngeal closure;Reduced tongue base retraction;Pharyngeal residue - valleculae Pharyngeal -- Pharyngeal- Nectar Cup Reduced airway/laryngeal closure;Reduced tongue base retraction;Penetration/Aspiration during swallow;Pharyngeal residue -  valleculae;Lateral channel residue;Reduced pharyngeal peristalsis;Reduced epiglottic inversion;Reduced anterior laryngeal mobility;Reduced laryngeal elevation Pharyngeal Material enters airway, remains ABOVE vocal cords and not ejected out Pharyngeal- Nectar Straw Reduced pharyngeal peristalsis;Reduced epiglottic inversion;Reduced anterior laryngeal mobility;Reduced laryngeal elevation;Reduced airway/laryngeal closure;Pharyngeal residue - valleculae;Reduced tongue base retraction;Penetration/Aspiration during swallow Pharyngeal Material enters airway, remains ABOVE vocal cords and not ejected out;Material enters airway, CONTACTS  cords and not ejected out Pharyngeal- Thin Teaspoon Reduced pharyngeal peristalsis;Reduced epiglottic inversion;Reduced anterior laryngeal mobility;Reduced laryngeal elevation;Reduced airway/laryngeal closure;Reduced tongue base retraction;Pharyngeal residue - valleculae;Pharyngeal residue - pyriform;Penetration/Aspiration during swallow;Penetration/Aspiration before swallow Pharyngeal Material enters airway, remains ABOVE vocal cords and not ejected out Pharyngeal- Thin Cup Reduced pharyngeal peristalsis;Reduced epiglottic inversion;Reduced anterior laryngeal mobility;Reduced laryngeal elevation;Reduced airway/laryngeal closure;Reduced tongue base retraction;Penetration/Aspiration before swallow;Penetration/Aspiration during swallow Pharyngeal Material enters airway, CONTACTS cords and not ejected out Pharyngeal- Thin Straw Reduced pharyngeal peristalsis;Reduced epiglottic inversion;Reduced anterior laryngeal mobility;Reduced laryngeal elevation;Reduced airway/laryngeal closure;Reduced tongue base retraction;Penetration/Aspiration during swallow;Penetration/Aspiration before swallow Pharyngeal Material enters airway, CONTACTS cords and not ejected out Pharyngeal- Puree Reduced pharyngeal peristalsis;Reduced epiglottic inversion;Reduced anterior laryngeal mobility;Reduced laryngeal  elevation;Reduced airway/laryngeal closure;Reduced tongue base retraction;Pharyngeal residue - valleculae Pharyngeal -- Pharyngeal- Mechanical Soft Reduced tongue base retraction;Reduced laryngeal elevation;Reduced anterior laryngeal mobility;Reduced epiglottic inversion;Reduced pharyngeal peristalsis;Pharyngeal residue - valleculae Pharyngeal -- Pharyngeal- Regular -- Pharyngeal -- Pharyngeal- Multi-consistency -- Pharyngeal -- Pharyngeal- Pill Reduced pharyngeal peristalsis;Reduced epiglottic inversion;Reduced anterior laryngeal mobility;Reduced laryngeal elevation;Reduced airway/laryngeal closure;Reduced tongue base retraction;Pharyngeal residue - valleculae Pharyngeal -- Pharyngeal Comment cued effortful swallow helpful to decrease residuals, chin tuck not consistently helpful to decrease or prevent residuals, cued and reflexive cough weak and did not clear penetrates, cued hock intermittently effective to clear vallecular residuals, liquid swallows helpful to decrease solid residuals but pt with penetration of liquids, nectar vs thin did not appear with significant variability  CHL IP CERVICAL ESOPHAGEAL PHASE 08/07/2016 Cervical Esophageal Phase Impaired Pudding Teaspoon -- Pudding Cup -- Honey Teaspoon -- Honey Cup -- Nectar Teaspoon -- Nectar Cup -- Nectar Straw -- Thin Teaspoon -- Thin Cup -- Thin Straw -- Puree -- Mechanical Soft -- Regular -- Multi-consistency -- Pill -- Cervical Esophageal Comment pt appeared with delayed clearance of esophagus with backflow of liquid WITHOUT awareness                                                       Claudie Fisherman, MS [facility] SLP [phone]               Scheduled Meds: . ampicillin-sulbactam (UNASYN) IV  3 g Intravenous Q6H  . aspirin EC  81 mg Oral Daily  . bisoprolol  5 mg Oral Daily  . buPROPion  300 mg Oral Daily  . clopidogrel  75 mg Oral Daily  . enoxaparin (LOVENOX) injection  40 mg Subcutaneous Q24H  . fluticasone  2 spray Each Nare  Daily  . fluticasone furoate-vilanterol  1 puff Inhalation Daily  . guaiFENesin  1,200 mg Oral BID  . insulin aspart  0-9 Units Subcutaneous TID WC  . insulin glargine  10 Units Subcutaneous QHS  . loratadine  10 mg Oral Daily  . magnesium sulfate 1 - 4 g bolus IVPB  2 g Intravenous Once  . pantoprazole  40 mg Oral Daily  . polyethylene glycol  17 g Oral BID  . potassium chloride  40 mEq Oral Q4H  . rosuvastatin  20 mg Oral Daily  . sodium chloride flush  3 mL Intravenous Q12H   Continuous Infusions: . sodium chloride 100 mL/hr at 08/08/16 0800    Active Problems:   DM (diabetes mellitus), type 2, uncontrolled ([place])   TIA (transient ischemic attack)-2013   History of CVA (cerebrovascular accident)   Tobacco abuse  Hyperlipidemia with target LDL less than 70   Claudication in peripheral vascular disease ([place])   CAD: Stents in p-mRCA, p & m Cx, p-mLAD   COPD (chronic obstructive pulmonary disease) ([place])   CAP (community acquired pneumonia)   COPD exacerbation ([place])   Alcohol abuse   Dysphagia with aspiration   Depression   Acute on chronic respiratory failure with hypoxemia ([place])   Pneumonia    Time spent: 30 minutes    Barton Dubois  [facility] Pager [phone]. If 7PM-7AM, please contact night-coverage at [email or web address], password [facility] 08/08/2016, 9:25 AM  LOS: 2 days

## 2016-08-08 NOTE — Progress Notes (Signed)
Pt.has had a few episodes of Ventricular Trigeminy with occasional PVC's. These are non-sustained and pt.reverts back to NSR. At present her VS are stable with HR in the 22'I and systolic in the 114'Y . RN will continue to monitor.

## 2016-08-08 NOTE — Progress Notes (Signed)
Pt. CBG at 0744 was 51. Pt. Was given 4 oz orange juice per protocol. CBG Rechecked at 0825 and resulted 65. Pt. Given another 4oz orange juice. CBG rechecked at 0905 and resulted 98. MD made aware. Pt. Was able to eat some of her breakfast when it came. RN will continue to monitor.

## 2016-08-08 NOTE — Progress Notes (Signed)
CRITICAL VALUE ALERT  Critical value received:  POTASSIUM 2.7  Date of notification:  08/08/2016  Time of notification:  0910  Critical value read back:Yes.    Nurse who received alert:  Isabelle Course RN   MD notified (1st page):  [facility]  Time of first page:  0911  MD notified (2nd page):  Time of second page:  Responding MD:  [facility]  Time MD responded:  0814

## 2016-08-09 DIAGNOSIS — Z8673 Personal history of transient ischemic attack (TIA), and cerebral infarction without residual deficits: Secondary | ICD-10-CM

## 2016-08-09 DIAGNOSIS — F329 Major depressive disorder, single episode, unspecified: Secondary | ICD-10-CM

## 2016-08-09 DIAGNOSIS — Z720 Tobacco use: Secondary | ICD-10-CM

## 2016-08-09 DIAGNOSIS — J690 Pneumonitis due to inhalation of food and vomit: Secondary | ICD-10-CM

## 2016-08-09 LAB — CULTURE, BLOOD (ROUTINE X 2)

## 2016-08-09 LAB — CBC
HEMATOCRIT: 32.7 % — AB (ref 36.0–46.0)
HEMOGLOBIN: 10.3 g/dL — AB (ref 12.0–15.0)
MCH: 27.1 pg (ref 26.0–34.0)
MCHC: 31.5 g/dL (ref 30.0–36.0)
MCV: 86.1 fL (ref 78.0–100.0)
Platelets: 182 10*3/uL (ref 150–400)
RBC: 3.8 MIL/uL — ABNORMAL LOW (ref 3.87–5.11)
RDW: 14.7 % (ref 11.5–15.5)
WBC: 9.5 10*3/uL (ref 4.0–10.5)

## 2016-08-09 LAB — MAGNESIUM: Magnesium: 1.9 mg/dL (ref 1.7–2.4)

## 2016-08-09 LAB — BASIC METABOLIC PANEL
Anion gap: 4 — ABNORMAL LOW (ref 5–15)
BUN: 11 mg/dL (ref 6–20)
CO2: 29 mmol/L (ref 22–32)
CREATININE: 0.61 mg/dL (ref 0.44–1.00)
Calcium: 7.9 mg/dL — ABNORMAL LOW (ref 8.9–10.3)
Chloride: 104 mmol/L (ref 101–111)
GFR calc Af Amer: >60 mL/min (ref >60)
GFR calc non Af Amer: >60 mL/min (ref >60)
Glucose, Bld: 109 mg/dL — ABNORMAL HIGH (ref 65–99)
Potassium: 4.3 mmol/L (ref 3.5–5.1)
Sodium: 137 mmol/L (ref 135–145)

## 2016-08-09 LAB — GLUCOSE, CAPILLARY
GLUCOSE-CAPILLARY: 133 mg/dL — AB (ref 65–99)
GLUCOSE-CAPILLARY: 219 mg/dL — AB (ref 65–99)
Glucose-Capillary: 74 mg/dL (ref 65–99)
Glucose-Capillary: 208 mg/dL — ABNORMAL HIGH (ref 65–99)

## 2016-08-09 NOTE — Progress Notes (Signed)
Nutrition Follow-up  DOCUMENTATION CODES:   Severe malnutrition in context of chronic illness  INTERVENTION:   Ensure Enlive po BID, each supplement provides 350 kcal and 20 grams of protein  Magic cup TID with meals, each supplement provides 290 kcal and 9 grams of protein  NUTRITION DIAGNOSIS:   Malnutrition related to chronic illness as evidenced by severe depletion of muscle mass, moderate depletion of body fat.  GOAL:   Patient will meet greater than or equal to 90% of their needs  MONITOR:   PO intake, Supplement acceptance, Labs, Weight trends  REASON FOR ASSESSMENT:   Consult Assessment of nutrition requirement/status  ASSESSMENT:   66 y.o. female significant past medical history of severe COPD and recurrent aspiration pneumonia in the setting of known dysphagia on home O2 at night, coronary artery disease status post PCI on plavix , tongue cancer status post radiation therapy with complaints of dysphagia and recurrent aspiration pneumonia; just recently discharge from the hospital due to PNA. Who presented with worsening SOB, increase oxygen requirement, productive cough and general malaise. Found to have sepsis due to PNA again.   Met with pt in room today. Pt eating <50% meals. Pt does not like the soups being offered on the full liquid diet. Pt is drinking some Glucerna and Ensure throughout the day. Pt likes orange ice cream. RD will order Magic Cups. Per chart, pt appears to be weight stable. Continue to encourage oral intakes. Speech following.   Medications reviewed and include: unasyn, aspirin, lovenox, insulin, MVI, protonix, miralax  Labs reviewed: Ca 7.9(L), alb 2.1(L)-3/7 cbgs- 102, 109 x 24 hrs AIC 8.4(H)  Nutrition-Focused physical exam completed. Findings are moderare fat depletion in upper arms and chest, severe muscle depletion over entire body, and mild edema.   Diet Order:  Diet full liquid Room service appropriate? Yes; Fluid consistency:  Thin  Skin:  Reviewed, no issues  Last BM:  none since admit  Height:   Ht Readings from Last 1 Encounters:  08/06/16 5' 5" (1.651 m)    Weight:   Wt Readings from Last 1 Encounters:  08/08/16 127 lb 3.3 oz (57.7 kg)    Ideal Body Weight:  56.82 kg  BMI:  Body mass index is 21.17 kg/m.  Estimated Nutritional Needs:   Kcal:  1440-1615 (25-28 kcal/kg)  Protein:  60-70 grams  Fluid:  >/= 1.6 L/day  EDUCATION NEEDS:   No education needs identified at this time  Koleen Distance, RD, LDN Pager #- [phone]

## 2016-08-09 NOTE — Progress Notes (Signed)
  Speech Language Pathology Treatment:    Patient Details Name: Brenda [name] MRN: [id number] DOB: 1950/10/10 Today's Date: 08/09/2016 Time: 4098-1191 SLP Time Calculation (min) (ACUTE ONLY): 41 min  Assessment / Plan / Recommendation Clinical Impression  At this time, Brenda [name] is congested - suspect secretions in pharynx/larynx based on audible gurgly breathing quality.  Unfortunately with cued cough she can not clear secretions due to gross weakness.  Pt familiar to this SLP from prior visit January 2018 when she was to have kyphoplasty but was diagnosed with pneumonia.  Spouse present and reports pt states she "is tired" and she is "so weak".    SLP did not present po due to aspiration of secretions without adequate ability to clear.  Called RT to determine if pt could get flutter valve to help with secretions but pt did not desire to be charged stating she has two at home. Spouse texted family member to have home flutter valve brought for pt.   Given progressive weight loss, recurrent aspiration from multifactorial dysphagia, and gross weakness - recommend to consider palliative consult.   Of note, pt had a MOST form and partial code status established during January 2018 admission but is now full code.   Prognosis for swallow to be functional in this acute stay is guarded. If pt to continue to eat it should be with accepted aspiration risks.  Advised pt and spouse to feeding tubes (pt had one prior during lingual base cancer treatments) do not prevent aspiration.  Unfortunate situation with pt who has acute on chronic dysphagia.  Spouse admits pt does not want to eat/drink although he is encouraging her. Advised both pt/spouse to ongoing aspiration/risks, etc.          HPI HPI: 66 yo female with h/o COPD, GERD, esophageal dysmotility adm with uncontrolled DM2 - found to have pna.  Pt reports dysphagia with liquids causing her to bubble in throat and making her cough/choke.   Pt with  esophageal dysmotility per 05/2016 and per [facility] 07/2015 laryngeal penetration of thin and residuals - pt was on dys3/nectar diet at home.  She reports nectar was helpful but can not state how.  Pt current with pna and desires po intake.        SLP Plan  Other (Comment)       Recommendations  Diet recommendations:  (NPO x sips water/ice or COMFORT PO with KNOWN RISKS) Medication Administration:  (whole with nectar or applesauce) Supervision: Patient able to self feed Compensations: Slow rate;Small sips/bites;Multiple dry swallows after each bite/sip ("strong swallow" and hock to clear, do not feed pt if she is gurgly or dyspenic) Postural Changes and/or Swallow Maneuvers: Seated upright 90 degrees;Upright 30-60 min after meal                Oral Care Recommendations: Oral care QID Follow up Recommendations: Home health SLP SLP Visit Diagnosis: Dysphagia, oropharyngeal phase (R13.12) Plan: Other (Comment)       [place], [facility] SLP [phone]

## 2016-08-09 NOTE — Progress Notes (Signed)
[facility] PROGRESS NOTE  Brenda [name] BJS:[id number] DOB: 09-19-1950 DOA: 08/06/2016 PCP: [name], MD  Interim summary and HPI 66 y.o.femalesignificant past medical history of severe COPD and recurrent aspiration pneumonia in the setting of known dysphagia on home O2 at night, coronary artery disease status post PCI on plavix , tongue cancer status post radiation therapy with complaints of dysphagia and recurrent aspiration pneumonia; just recently discharge from the hospital due to PNA. Who presented with worsening SOB, increase oxygen requirement, productive cough and general malaise. Found to have sepsis due to PNA again.  Assessment/Plan: 1-Sepsis due to PNA and strep pneumo bacteremia  -patient on admission with temp of 101.2, HR 112, RR 25 and hypoxic on chronic oxygen supplementation. -received IVF's and started on IV antibiotics  -sepsis features resolving/resolved; no longer febrile, WBC's WNL and overall hemodynamically stable. -continue supportive care  2-acute on chronic resp failure with hypoxia: patient chronically on 2L [place]; now on 3-4L; on arrival to ED O2 sat was 88% -most likely due to aspiration PNA; but patient recently hospitalized; so covered with broad spectrum agents (cefepime and vancomycin) on admission -low concerns for pseudomonas infection (despite recent hospitalization); also with neg MRSA PCR; so abx's narrowed to unasyn (aiming especially for aspiration). -continue nebulizer therapy  -following SPL rec's and MBS test results, patient started on full liquid diet; high risk fo aspiration and unable to progress further. -continue supportive care and weaned O2 as tolerated -palliative care consulted.  3-Severe COPD: no wheezing currently -will continue albuterol, Breo and PRN nebs -continue oxygen supplementation -will continue flutter valve (family will bring from home) and continue mucinex  4-DM type 2: uncontrolled with A1C 9.0 (in dec  2017) -will continue SSI and lantus; last one adjusted due to hypoglycemic event -further adjustment on hypoglycemic regimen as needed   5-hx of CAD and tachycardia -neg troponin -continue bisoprolol (dose adjusted for better control) and plavix  6-hx of tongue cancer and chronic dysphagia: -will follow SPL rec's and results of MBS -diet advance to full liquid  -high risk for aspiration; having trouble clearing secretions currently  7-severe protein calorie malnutrition  -nutritional service consulted -but limited intervention given dysphagia and aspiration  -on glucerna TID between meals currently   8-dehydration, hypokalemia, hypomagnesemia and hyponatremia -all electrolytes stable now after repletion and fluid resuscitation. -will follow trend  Code Status: Full Family Communication: husband at bedside Disposition Plan: has overall remain stable from breathing stand point and has not required BIPAP. Will get palliative involve, as patient aspiration risk significantly high and unable to be improved or reverted. Patient also full Code; but with MOST form from January suggesting partial Code. will benefit of clarification in code status and advance directives. overall poor prognosis.   Consultants:  SPL  Palliative Care  Procedures:  See below for x-ray reports   Antibiotics:  unasyn 08/07/16  HPI/Subjective: Patient with mild tachypnea; no fever; cachetic and chronically ill in appearance. Upper airway congestion appreciated. Patient with weak cough and having inability to clear secretions properly.  Objective: Vitals:   08/09/16 1427 08/09/16 2003  BP: 106/61 109/64  Pulse: 86 91  Resp: 20 (!) 22  Temp: 98.9 F (37.2 C) 99.3 F (37.4 C)    Intake/Output Summary (Last 24 hours) at 08/09/16 2257 Last data filed at 08/09/16 1855  Gross per 24 hour  Intake          1571.67 ml  Output  0 ml  Net          1571.67 ml   Filed Weights   08/06/16  1206 08/08/16 0500  Weight: 54.9 kg (121 lb) 57.7 kg (127 lb 3.3 oz)    Exam:   General:  Patient feeling weak and tired; gargling sound in upper airways appreciated. Denies CP. Remains SOB and requiring 4-5 L of O2 supplementation. Patient endorses not appetite. Normally uses 2L [place] at home)  Cardiovascular: tachycardic, no rubs or gallops, no murmurs appreciated.  Respiratory: mild tachypnea, still with diffuse rhonchi and rattles; no wheezing, no using accessory muscles today. fair air movement.   Abdomen: soft, NT, ND, positive BS  Musculoskeletal: no edema, no cyanosis, no clubbing    Data Reviewed: Basic Metabolic Panel:  Recent Labs Lab 08/06/16 1204 08/07/16 0348 08/08/16 0843 08/09/16 0542  NA 126* 133* 135 137  K 4.0 3.3* 2.7* 4.3  CL 87* 98* 102 104  CO2 '29 28 26 29  '$ GLUCOSE 217* 84 102* 109*  BUN 28* 22* 14 11  CREATININE 0.90 0.64 0.71 0.61  CALCIUM 8.9 8.3* 8.1* 7.9*  MG 2.0  --   --  1.9   Liver Function Tests:  Recent Labs Lab 08/06/16 1204 08/07/16 0348  AST 23 18  ALT 14 11*  ALKPHOS 215* 206*  BILITOT 1.3* 0.8  PROT 6.5 5.7*  ALBUMIN 2.7* 2.1*   CBC:  Recent Labs Lab 08/06/16 1204 08/07/16 0348 08/09/16 0542  WBC 21.9* 13.7* 9.5  NEUTROABS 20.5*  --   --   HGB 12.5 10.6* 10.3*  HCT 38.4 32.7* 32.7*  MCV 86.5 85.2 86.1  PLT 203 178 182   BNP (last 3 results)  Recent Labs  05/09/16 2102 08/06/16 1204  BNP 175.5* 728.4*    CBG:  Recent Labs Lab 08/08/16 2034 08/09/16 0738 08/09/16 1142 08/09/16 1651 08/09/16 2005  GLUCAP 341* 74 133* 208* 219*    Recent Results (from the past 240 hour(s))  Blood Culture (routine x 2)     Status: None (Preliminary result)   Collection Time: 08/06/16 12:36 PM  Result Value Ref Range Status   Specimen Description BLOOD RIGHT ANTECUBITAL  Final   Special Requests BOTTLES DRAWN AEROBIC AND ANAEROBIC 5CC  Final   Culture  Setup Time   Final    GRAM POSITIVE COCCI IN PAIRS IN BOTH  AEROBIC AND ANAEROBIC BOTTLES CRITICAL VALUE NOTED.  VALUE IS CONSISTENT WITH PREVIOUSLY REPORTED AND CALLED VALUE.    Culture   Final    GRAM POSITIVE COCCI IDENTIFICATION TO FOLLOW Performed at [facility], [place] [address], [place], [place] [address]    Report Status PENDING  Incomplete  Blood Culture (routine x 2)     Status: Abnormal   Collection Time: 08/06/16 12:39 PM  Result Value Ref Range Status   Specimen Description BLOOD RIGHT ARM  Final   Special Requests BOTTLES DRAWN AEROBIC AND ANAEROBIC 5CC  Final   Culture  Setup Time   Final    GRAM POSITIVE COCCI IN PAIRS IN BOTH AEROBIC AND ANAEROBIC BOTTLES CRITICAL RESULT CALLED TO, READ BACK BY AND VERIFIED WITH: DReymundo Poll PHARMD, AT 5852 08/07/16 BY Rush Landmark Performed at [facility], [place] [address], [place], [place] [address]    Culture STREPTOCOCCUS PNEUMONIAE (A)  Final   Report Status 08/09/2016 FINAL  Final   Organism ID, Bacteria STREPTOCOCCUS PNEUMONIAE  Final      Susceptibility   Streptococcus pneumoniae - MIC*  ERYTHROMYCIN <=0.12 SENSITIVE Sensitive     LEVOFLOXACIN 1 SENSITIVE Sensitive     PENICILLIN (meningitis) <=0.06 SENSITIVE Sensitive     PENICILLIN (non-meningitis) <=0.06 SENSITIVE Sensitive     CEFTRIAXONE (non-meningitis) <=0.12 SENSITIVE Sensitive     CEFTRIAXONE (meningitis) <=0.12 SENSITIVE Sensitive     * STREPTOCOCCUS PNEUMONIAE  Blood Culture ID Panel (Reflexed)     Status: Abnormal   Collection Time: 08/06/16 12:39 PM  Result Value Ref Range Status   Enterococcus species NOT DETECTED NOT DETECTED Final   Listeria monocytogenes NOT DETECTED NOT DETECTED Final   Staphylococcus species NOT DETECTED NOT DETECTED Final   Staphylococcus aureus NOT DETECTED NOT DETECTED Final   Streptococcus species DETECTED (A) NOT DETECTED Final    Comment: CRITICAL RESULT CALLED TO, READ BACK BY AND VERIFIED WITH: D. LAFFORD PHARMD, AT 4854 08/07/16 BY D. VANHOOK    Streptococcus agalactiae NOT  DETECTED NOT DETECTED Final   Streptococcus pneumoniae DETECTED (A) NOT DETECTED Final    Comment: CRITICAL RESULT CALLED TO, READ BACK BY AND VERIFIED WITH: D. LAFFORD PHARMD, AT 6270 08/07/16 BY D. VANHOOK    Streptococcus pyogenes NOT DETECTED NOT DETECTED Final   Acinetobacter baumannii NOT DETECTED NOT DETECTED Final   Enterobacteriaceae species NOT DETECTED NOT DETECTED Final   Enterobacter cloacae complex NOT DETECTED NOT DETECTED Final   Escherichia coli NOT DETECTED NOT DETECTED Final   Klebsiella oxytoca NOT DETECTED NOT DETECTED Final   Klebsiella pneumoniae NOT DETECTED NOT DETECTED Final   Proteus species NOT DETECTED NOT DETECTED Final   Serratia marcescens NOT DETECTED NOT DETECTED Final   Haemophilus influenzae NOT DETECTED NOT DETECTED Final   Neisseria meningitidis NOT DETECTED NOT DETECTED Final   Pseudomonas aeruginosa NOT DETECTED NOT DETECTED Final   Candida albicans NOT DETECTED NOT DETECTED Final   Candida glabrata NOT DETECTED NOT DETECTED Final   Candida krusei NOT DETECTED NOT DETECTED Final   Candida parapsilosis NOT DETECTED NOT DETECTED Final   Candida tropicalis NOT DETECTED NOT DETECTED Final    Comment: Performed at [facility], [address], [place], [place] [address]  MRSA PCR Screening     Status: None   Collection Time: 08/06/16  8:24 PM  Result Value Ref Range Status   MRSA by PCR NEGATIVE NEGATIVE Final    Comment:        The GeneXpert MRSA Assay (FDA approved for NASAL specimens only), is one component of a comprehensive MRSA colonization surveillance program. It is not intended to diagnose MRSA infection nor to guide or monitor treatment for MRSA infections.   Respiratory Panel by PCR     Status: None   Collection Time: 08/06/16 10:12 PM  Result Value Ref Range Status   Adenovirus NOT DETECTED NOT DETECTED Final   Coronavirus 229E NOT DETECTED NOT DETECTED Final   Coronavirus HKU1 NOT DETECTED NOT DETECTED Final   Coronavirus  NL63 NOT DETECTED NOT DETECTED Final   Coronavirus OC43 NOT DETECTED NOT DETECTED Final   Metapneumovirus NOT DETECTED NOT DETECTED Final   Rhinovirus / Enterovirus NOT DETECTED NOT DETECTED Final   Influenza A NOT DETECTED NOT DETECTED Final   Influenza B NOT DETECTED NOT DETECTED Final   Parainfluenza Virus 1 NOT DETECTED NOT DETECTED Final   Parainfluenza Virus 2 NOT DETECTED NOT DETECTED Final   Parainfluenza Virus 3 NOT DETECTED NOT DETECTED Final   Parainfluenza Virus 4 NOT DETECTED NOT DETECTED Final   Respiratory Syncytial Virus NOT DETECTED NOT DETECTED Final  Bordetella pertussis NOT DETECTED NOT DETECTED Final   Chlamydophila pneumoniae NOT DETECTED NOT DETECTED Final   Mycoplasma pneumoniae NOT DETECTED NOT DETECTED Final    Comment: Performed at [facility], [place] [address] [place], [place] [address]  Culture, sputum-assessment     Status: None   Collection Time: 08/07/16 10:30 PM  Result Value Ref Range Status   Specimen Description SPUTUM  Final   Special Requests NONE  Final   Sputum evaluation THIS SPECIMEN IS ACCEPTABLE FOR SPUTUM CULTURE  Final   Report Status 08/07/2016 FINAL  Final  Culture, respiratory (NON-Expectorated)     Status: None (Preliminary result)   Collection Time: 08/07/16 10:30 PM  Result Value Ref Range Status   Specimen Description SPUTUM  Final   Special Requests NONE Reflexed from [id number]  Final   Gram Stain   Final    FEW WBC PRESENT, PREDOMINANTLY PMN RARE GRAM POSITIVE COCCI    Culture   Final    NO GROWTH 1 DAY Performed at [facility], [place] [address], [place], [place] [address]    Report Status PENDING  Incomplete     Studies: No results found.  Scheduled Meds: . ampicillin-sulbactam (UNASYN) IV  3 g Intravenous Q6H  . aspirin EC  81 mg Oral Daily  . bisoprolol  5 mg Oral Daily  . buPROPion  300 mg Oral Daily  . clopidogrel  75 mg Oral Daily  . enoxaparin (LOVENOX) injection  40 mg Subcutaneous Q24H  . feeding  supplement (ENSURE ENLIVE)  237 mL Oral BID BM  . fluticasone  2 spray Each Nare Daily  . fluticasone furoate-vilanterol  1 puff Inhalation Daily  . guaiFENesin  1,200 mg Oral BID  . insulin aspart  0-9 Units Subcutaneous TID WC  . insulin glargine  8 Units Subcutaneous QHS  . loratadine  10 mg Oral Daily  . multivitamin with minerals  1 tablet Oral Daily  . pantoprazole  40 mg Oral Daily  . polyethylene glycol  17 g Oral BID  . rosuvastatin  20 mg Oral Daily  . sodium chloride flush  3 mL Intravenous Q12H   Continuous Infusions: . sodium chloride 100 mL/hr at 08/09/16 1855    Active Problems:   DM (diabetes mellitus), type 2, uncontrolled ([place])   TIA (transient ischemic attack)-2013   History of CVA (cerebrovascular accident)   Tobacco abuse   Hyperlipidemia with target LDL less than 70   Claudication in peripheral vascular disease ([place])   CAD: Stents in p-mRCA, p & m Cx, p-mLAD   COPD (chronic obstructive pulmonary disease) ([place])   CAP (community acquired pneumonia)   COPD exacerbation ([employer] [place])   Alcohol abuse   Dysphagia with aspiration   Depression   Acute on chronic respiratory failure with hypoxemia ([place])   Pneumonia    Time spent: 25 minutes    Barton Dubois  [facility] Pager [phone]. If 7PM-7AM, please contact night-coverage at [email or web address], password [facility] 08/09/2016, 10:57 PM  LOS: 3 days

## 2016-08-10 DIAGNOSIS — Z515 Encounter for palliative care: Secondary | ICD-10-CM

## 2016-08-10 DIAGNOSIS — A419 Sepsis, unspecified organism: Secondary | ICD-10-CM

## 2016-08-10 DIAGNOSIS — J189 Pneumonia, unspecified organism: Secondary | ICD-10-CM

## 2016-08-10 LAB — GLUCOSE, CAPILLARY
GLUCOSE-CAPILLARY: 62 mg/dL — AB (ref 65–99)
GLUCOSE-CAPILLARY: 73 mg/dL (ref 65–99)
GLUCOSE-CAPILLARY: 81 mg/dL (ref 65–99)
Glucose-Capillary: 87 mg/dL (ref 65–99)
Glucose-Capillary: 109 mg/dL — ABNORMAL HIGH (ref 65–99)

## 2016-08-10 LAB — CULTURE, RESPIRATORY

## 2016-08-10 LAB — CULTURE, BLOOD (ROUTINE X 2)

## 2016-08-10 LAB — CULTURE, RESPIRATORY W GRAM STAIN: Culture: NORMAL

## 2016-08-10 NOTE — Consult Note (Signed)
Consultation Note Date: 08/10/2016   Patient Name: Brenda [name]  DOB: 26-Feb-1951  MRN: [id number]  Age / Sex: 66 y.o., female  PCP: Brenda Pepper, MD Referring Physician: Barton Dubois, MD  Reason for Consultation: Establishing goals of care  HPI/Patient Profile: 66 y.o. female    admitted on 08/06/2016    Clinical Assessment and Goals of Care:  66 year old female with a past medical history significant for severe COPD, recurrent aspiration pneumonia, history of known dysphagia, was on home oxygen 2 L more recently increased to 3-4 liters, history of coronary artery disease, history of tongue cancer status post radiation, ongoing functional decline, recent recurrent hospitalizations for lung related infections, sees Dr. Lake [name] from pulmonary in the outpatient setting, again admitted to hospitalist service for sepsis due to pneumonia, strep pneumo bacteremia, acute on chronic respiratory failure with hypoxia, ongoing dysphasia, ongoing high aspiration risk, diet not able to be progressed beyond full liquids due to ongoing congestion and risk of aspiration.  A palliative consultation has been requested for CODE STATUS and goals of care discussions. Patient has been seen by palliative provider and previous hospitalization. At that time are most form was completed and patient had elected for partial code.  Patient is an elderly appearing lady resting in bed. She appears weak and tired. She has severe rhonchi and congestion audible even outside the room. There is no family present at the bedside. Patient states she recognizes that her lungs and her overall condition is one of ongoing irreversible decline, she states she is becoming tired of recurrent admissions.  Patient defers to her daughter Brenda [name] who is in the medical profession to make all her decisions. Call placed and discussed with daughter Brenda [name] who states that  she works at [employer] and drives between [employer] and [place] [employer] campuses at night. She works at night and lives with patient and her husband and helps take care of them during the day. Brenda [name] has a 42-year-old at home who is very distraught and wants to come see her grandmother at the hospital.  I discussed frankly with Brenda [name] about the patient's current condition. Patient has a reversible conditions for which there is no cure. Patient is having gradual progressive functional decline. We discussed extensively about patient's symptom burden in disease burden significantly inhibiting her quality of life. We discussed that her time may be short. Goals and wishes discussed again.  Brenda [name] states that the patient has already established as DO NOT RESUSCITATE, DO NOT INTUBATE when she was undergoing her kyphoplasty recently. Brenda [name] states that the patient is having difficulty getting out of bed at home and is progressively getting weaker and nauseous.  See recommendations below. Thank you for the consult.  NEXT OF KIN  daughter Brenda [name], husband.   SUMMARY OF RECOMMENDATIONS    1. After extensive discussions, with patient and daughter Brenda [name], code status now revised to DNR DNI.  2. Continue current PO diet, daughter aware that due to patient's severe aspiration, she is not able to be  advanced beyond full liquids at this time.  3. Disposition: home with hospice support, patient's husband and daughter live with her.  4. Prognosis likely not more than few weeks discussed frankly with daughter Brenda [name] over the phone. She is tearful, but accepting of the patient's ongoing irreversible decline.  83. Daughter is asking for her 47 year old daughter and 44 year old nephew to be allowed to visit with the patient for a few minutes, daughter Brenda [name] works for [employer] at [employer] and is well aware of flu precautions for visitors less than 12. Hopefully, we can facilitate a short visit.   Code Status/Advance  Care Planning:  DNR    Symptom Management:    continue current mode of care.   Palliative Prophylaxis:   Bowel Regimen     Psycho-social/Spiritual:   Desire for further Chaplaincy support:no  Additional Recommendations: Education on Hospice  Prognosis:   < 4 weeks  Discharge Planning: Home with Hospice      Primary Diagnoses: Present on Admission: . DM (diabetes mellitus), type 2, uncontrolled ([place]) . TIA (transient ischemic attack)-2013 . Tobacco abuse . Hyperlipidemia with target LDL less than 70 . Claudication in peripheral vascular disease ([place]) . CAD: Stents in p-mRCA, p & m Cx, p-mLAD . COPD (chronic obstructive pulmonary disease) ([place]) . Alcohol abuse . COPD exacerbation ([place]) . CAP (community acquired pneumonia) . Depression . Acute on chronic respiratory failure with hypoxemia ([place]) . Pneumonia   I have reviewed the medical record, interviewed the patient and family, and examined the patient. The following aspects are pertinent.  Past Medical History:  Diagnosis Date  . Arthritis    "hands" (11/26/2012)  . CAD S/P percutaneous coronary angioplasty 1999; 2001;2003, 10/2014   a) BMS- mCx; b) re-do PCI for prog of Dz (NIR BMS 3.5 x 12); c) staged PCI: p-mRCA Express II BMS 3.0x24 & 3.5 x 16 --> p-mLAD Cyper DES 3.0 x 13 & PTCA of D2 (2.5 balloon); 10/2015 PCI p-m LAD Promus DES 2.75 x 38 (covers pre&post-stent ISR), POBA of ostial rPDA  . Cancer of base of tongue ([facility] [place]) 2010   "& lymph nodes @ right neck" (11/26/2012  . Carotid artery occlusion   . Chronic bronchitis ([place])   . COPD (chronic obstructive pulmonary disease) with emphysema ([place])   . Depression   . GERD (gastroesophageal reflux disease)   . H/O hiatal hernia   . History of blood transfusion   . History of DVT (deep vein thrombosis)   . Hx of radiation therapy   . Hx of radiation therapy 01/16/09 - 03/06/09   base of tongue, right neck node  . Hypercholesteremia   . Myocardial infarction  1999, 2003  . PAD (peripheral artery disease) ([name])    status post bilateral femoropopliteal bypass grafting --> Dr. Sherren Mocha [name]; -  02/2013 Revision of R Fem-AKPop to BK Pop.  . Patent foramen ovale February 2013   Small. Small PFO noted on TTE for TIA/CVA R-L + Bubble study with valsalva  . Pneumonia   . Stroke [facility])    left side weakness remains (11/26/2012)  . Transient ischemic attack (TIA) February 2013  . Type II diabetes mellitus ([place])    Social History   Social History  . Marital status: Married    Spouse name: N/A  . Number of children: N/A  . Years of education: N/A   Occupational History  . house wife    Social History Main Topics  . Smoking status: Former Smoker  Packs/day: 1.00    Years: 50.00    Types: Cigarettes    Quit date: 04/03/2016  . Smokeless tobacco: Never Used     Comment: up to 0.5 ppd 02/13/16  . Alcohol use Yes     Comment: occ  . Drug use: No  . Sexual activity: No   Other Topics Concern  . None   Social History Narrative  . None   Family History  Problem Relation Age of Onset  . Cancer Mother     pancreatic  . Diabetes Mother   . Hyperlipidemia Mother   . Hypertension Mother   . Other Mother     varicose vein  . Cancer Father 72    throat  . Heart disease Father   . Heart attack Father   . Cancer Sister     breast  . Diabetes Sister   . Deep vein thrombosis Brother   . Diabetes Brother   . Hearing loss Brother   . Hyperlipidemia Brother   . Hypertension Brother   . Heart attack Brother   . Clotting disorder Brother   . Diabetes Son   . Hyperlipidemia Son    Scheduled Meds: . ampicillin-sulbactam (UNASYN) IV  3 g Intravenous Q6H  . aspirin EC  81 mg Oral Daily  . bisoprolol  5 mg Oral Daily  . buPROPion  300 mg Oral Daily  . clopidogrel  75 mg Oral Daily  . enoxaparin (LOVENOX) injection  40 mg Subcutaneous Q24H  . feeding supplement (ENSURE ENLIVE)  237 mL Oral BID BM  . fluticasone  2 spray Each Nare Daily  .  fluticasone furoate-vilanterol  1 puff Inhalation Daily  . guaiFENesin  1,200 mg Oral BID  . insulin aspart  0-9 Units Subcutaneous TID WC  . insulin glargine  8 Units Subcutaneous QHS  . loratadine  10 mg Oral Daily  . multivitamin with minerals  1 tablet Oral Daily  . pantoprazole  40 mg Oral Daily  . polyethylene glycol  17 g Oral BID  . rosuvastatin  20 mg Oral Daily  . sodium chloride flush  3 mL Intravenous Q12H   Continuous Infusions: . sodium chloride 100 mL/hr at 08/10/16 0429   PRN Meds:.acetaminophen **OR** acetaminophen, albuterol, cyclobenzaprine, levalbuterol, ondansetron **OR** ondansetron (ZOFRAN) IV, traMADol Medications Prior to Admission:  Prior to Admission medications   Medication Sig Start Date End Date Taking? Authorizing Provider  acetaminophen (TYLENOL) 325 MG tablet Take 2 tablets (650 mg total) by mouth every 6 (six) hours as needed. Patient taking differently: Take 650 mg by mouth every 6 (six) hours as needed for mild pain.  05/01/16  Yes [name], PA-C  aspirin EC 81 MG tablet Take 1 tablet (81 mg total) by mouth daily. 10/07/15  Yes Thurnell Lose, MD  bisoprolol (ZEBETA) 5 MG tablet Take 0.5 tablets (2.5 mg total) by mouth daily. 05/15/16  Yes David Tat, MD  BREO ELLIPTA 100-25 MCG/INH AEPB INHALE ONE PUFF BY MOUTH ONCE DAILY 08/01/16  Yes Tammy S Parrett, NP  buPROPion (WELLBUTRIN XL) 300 MG 24 hr tablet Take 300 mg by mouth daily.  09/19/15  Yes Historical Provider, MD  clopidogrel (PLAVIX) 75 MG tablet Take 1 tablet (75 mg total) by mouth daily. Please don't take plavix until you have kyphoplasty. May resume after the procedure, please talk your PCP. 06/12/16  Yes Charlynne Cousins, MD  fish oil-omega-3 fatty acids 1000 MG capsule Take 1 g by mouth 2 (two) times daily.  Yes Historical Provider, MD  ibuprofen (ADVIL,MOTRIN) 200 MG tablet Take 400 mg by mouth every 6 (six) hours as needed (Pain).   Yes Historical Provider, MD  insulin glargine  (LANTUS) 100 UNIT/ML injection Inject 0.1 mLs (10 Units total) into the skin at bedtime. Patient taking differently: Inject 10-20 Units into the skin at bedtime.  05/14/16  Yes Orson Eva, MD  insulin lispro (HUMALOG) 100 UNIT/ML injection Inject 3 Units into the skin 3 (three) times daily with meals as needed for high blood sugar.    Yes Historical Provider, MD  ipratropium-albuterol (DUONEB) 0.5-2.5 (3) MG/3ML SOLN Take 3 mLs by nebulization every 6 (six) hours. Patient taking differently: Take 3 mLs by nebulization 2 (two) times daily as needed (for wheezingt or shortness of breath).  06/02/15  Yes Debbe Odea, MD  SPIRIVA RESPIMAT 2.5 MCG/ACT AERS INHALE TWO SPRAY(S) BY MOUTH ONCE DAILY 08/01/16  Yes Tammy S Parrett, NP  albuterol (PROVENTIL HFA;VENTOLIN HFA) 108 (90 BASE) MCG/ACT inhaler Inhale 2 puffs into the lungs every 6 (six) hours as needed for wheezing or shortness of breath. 01/20/15   Belkys A Regalado, MD  amoxicillin-clavulanate (AUGMENTIN) 250-62.5 MG/5ML suspension Take 17.5 mLs (875 mg total) by mouth every 12 (twelve) hours. Patient not taking: Reported on 08/06/2016 06/11/16   Charlynne Cousins, MD  cyclobenzaprine (FLEXERIL) 5 MG tablet Take 1-2 tablets (5-10 mg total) by mouth 3 (three) times daily as needed for muscle spasms. 05/09/16   Leandrew Koyanagi, MD  feeding supplement, GLUCERNA SHAKE, (GLUCERNA SHAKE) LIQD Take 237 mLs by mouth 2 (two) times daily between meals. 04/09/16   Donita Brooks, NP  fluticasone (FLONASE) 50 MCG/ACT nasal spray Place 2 sprays into both nostrils daily. 06/02/15   Debbe Odea, MD  guaiFENesin (MUCINEX) 600 MG 12 hr tablet Take 1,200 mg by mouth 2 (two) times daily.    Historical Provider, MD  loratadine (CLARITIN) 10 MG tablet Take 1 tablet (10 mg total) by mouth daily. Patient not taking: Reported on 08/06/2016 06/02/15   Debbe Odea, MD  Maltodextrin-Xanthan Gum ([place]) POWD Use with thin liquids to make nectar thickened consistency  05/14/16   Orson Eva, MD  nitroGLYCERIN (NITROLINGUAL) 0.4 MG/SPRAY spray Place 1 spray under the tongue every 5 (five) minutes as needed for chest pain. 01/20/15   Belkys A Regalado, MD  omeprazole (PRILOSEC) 20 MG capsule Take 20 mg by mouth daily.     Historical Provider, MD  oxyCODONE-acetaminophen (PERCOCET/ROXICET) 5-325 MG tablet Take 1 tablet by mouth every 6 (six) hours as needed for moderate pain or severe pain. Patient not taking: Reported on 08/06/2016 06/11/16   Charlynne Cousins, MD  polyethylene glycol powder [facility]) powder Take 17 g by mouth 2 (two) times daily. Patient taking differently: Take 17 g by mouth 2 (two) times daily as needed for moderate constipation.  05/01/16   [name], PA-C  rosuvastatin (CRESTOR) 20 MG tablet Take 20 mg by mouth daily.    Historical Provider, MD  traMADol (ULTRAM) 50 MG tablet Take 1 tablet (50 mg total) by mouth every 6 (six) hours as needed for moderate pain or severe pain. 05/09/16   Leandrew Koyanagi, MD   Allergies  Allergen Reactions  . Tape Hives and Other (See Comments)    USE PAPER TAPE ONLY- Adhesive peels off skin-makes pt. Raw.  . Isosorbide Other (See Comments)    Drops BP too low  . Nicoderm [Nicotine] Rash    To the [facility] only,  breakouts on skin   Review of Systems + for congestion + for dyspnea + for ongoing generalized weakness  Physical Exam Weak tired Coarse breath sounds Congested rhonchi anteriorly S1 S2 Abdomen soft No edema Awake alert  Vital Signs: BP 123/69 (BP Location: Left Arm)   Pulse 93   Temp 98.6 F (37 C) (Oral)   Resp 20   Ht '5\' 5"'$  (1.651 m)   Wt 57.7 kg (127 lb 3.3 oz)   LMP  (LMP Unknown)   SpO2 95%   BMI 21.17 kg/m  Pain Assessment: No/denies pain   Pain Score: 0-No pain   SpO2: SpO2: 95 % O2 Device:SpO2: 95 % O2 Flow Rate: .O2 Flow Rate (L/min): 5 L/min  IO: Intake/output summary:  Intake/Output Summary (Last 24 hours) at 08/10/16 1315 Last data filed at 08/10/16  0546  Gross per 24 hour  Intake          2211.67 ml  Output                0 ml  Net          2211.67 ml    LBM: Last BM Date:  (pta) Baseline Weight: Weight: 54.9 kg (121 lb) Most recent weight: Weight: 57.7 kg (127 lb 3.3 oz)     Palliative Assessment/Data:   Flowsheet Rows   Flowsheet Row Most Recent Value  Intake Tab  Referral Department  Hospitalist  Unit at Time of Referral  Med/Surg Unit  Palliative Care Primary Diagnosis  Pulmonary  Palliative Care Type  Return patient Palliative Care  Reason for referral  Non-pain Symptom, Clarify Goals of Care, Counsel Regarding Hospice  Date first seen by Palliative Care  08/10/16  Clinical Assessment  Palliative Performance Scale Score  30%  Pain Max last 24 hours  4  Pain Min Last 24 hours  3  Dyspnea Max Last 24 Hours  6  Dyspnea Min Last 24 hours  5  Psychosocial & Spiritual Assessment  Palliative Care Outcomes  Patient/Family meeting held?  Yes  Who was at the meeting?  patient, daughter   Palliative Care Outcomes  Clarified goals of care      Time In:  40 Time Out:  1310 Time Total:  70 min  Greater than 50%  of this time was spent counseling and coordinating care related to the above assessment and plan.  Signed by: Loistine Chance, MD  [phone]  Please contact Palliative Medicine Team phone at [phone] for questions and concerns.  For individual provider: See Shea Evans

## 2016-08-10 NOTE — Progress Notes (Signed)
[facility] PROGRESS NOTE  Brenda [name] QQP:[id number] DOB: December 16, 1950 DOA: 08/06/2016 PCP: [name], MD  Interim summary and HPI 66 y.o.femalesignificant past medical history of severe COPD and recurrent aspiration pneumonia in the setting of known dysphagia on home O2 at night, coronary artery disease status post PCI on plavix , tongue cancer status post radiation therapy with complaints of dysphagia and recurrent aspiration pneumonia; just recently discharge from the hospital due to PNA. Who presented with worsening SOB, increase oxygen requirement, productive cough and general malaise. Found to have sepsis due to PNA again.  Assessment/Plan: 1-Sepsis due to PNA and strep pneumo bacteremia  -patient on admission with temp of 101.2, HR 112, RR 25 and hypoxic on chronic oxygen supplementation. -received IVF's and started on IV antibiotics  -sepsis features resolving/resolved; no longer febrile, WBC's WNL and overall hemodynamically stable. -continue supportive care -will complete 5 days of IV antibiotics, before transition to PO augmentin to complete therapy  2-acute on chronic resp failure with hypoxia: patient chronically on 2L [place]; now on 3-4L; on arrival to ED O2 sat was 88% -most likely due to aspiration PNA; but patient recently hospitalized; so covered with broad spectrum agents (cefepime and vancomycin) on admission -low concerns for pseudomonas infection (despite recent hospitalization); also with neg MRSA PCR; so abx's narrowed to unasyn (aiming especially for aspiration). -continue nebulizer therapy  -following SPL rec's and MBS test results, patient started on full liquid diet; high risk fo aspiration and unable to progress further. -continue supportive care and weaned O2 as tolerated -palliative care consultation appreciated. Will arrange for home hospice at discharge; patient DNR/DNI  3-Severe COPD: no wheezing currently -will continue albuterol, Breo and PRN  nebs -continue oxygen supplementation -will continue flutter valve (family will bring from home) and continue mucinex  4-DM type 2: uncontrolled with A1C 9.0 (in dec 2017) -will continue SSI and lantus; last one adjusted due to hypoglycemic event -further adjustment on hypoglycemic regimen as needed  -now that the plan is mainly comfort care; will review with family probably minimizing insulin use.  5-hx of CAD and tachycardia -neg troponin -continue bisoprolol (dose adjusted for better control) and plavix  6-hx of tongue cancer and chronic dysphagia: -will follow SPL rec's and results of MBS -diet advance to full liquid and planning to advance further due to high risk for aspiration  7-severe protein calorie malnutrition  -nutritional service consulted -but limited intervention given dysphagia and aspiration  -on glucerna TID between meals currently   8-dehydration, hypokalemia, hypomagnesemia and hyponatremia -all electrolytes stable now after repletion and fluid resuscitation. -will mainly focus on non-invasive treatment and more comfort care approach -adjust IVf's rate  Code Status: Full Family Communication: husband at bedside Disposition Plan: has overall remain stable from breathing stand point and has not required BIPAP. Appreciate palliative care consultation. Per discussion, patient now DNR/DNI and will pursuit home hospice. Will give one more day of IV antibiotics to (compelte 5 days; before switching to oral regimen). CM notified to assist with discharge needs.   Consultants:  SPL  Palliative Care  Procedures:  See below for x-ray reports   Antibiotics: unasyn 08/07/16  HPI/Subjective: Patient with mild tachypnea; no fever; cachetic and chronically ill in appearance. High risk for aspiration and with appreciated gargling/secretion clearance difficulties.  Objective: Vitals:   08/10/16 0806 08/10/16 1404  BP: 123/69 (!) 98/59  Pulse: 93 79  Resp: 20 18   Temp:  98.5 F (36.9 C)    Intake/Output Summary (Last 24  hours) at 08/10/16 1551 Last data filed at 08/10/16 1300  Gross per 24 hour  Intake          1581.67 ml  Output                0 ml  Net          1581.67 ml   Filed Weights   08/06/16 1206 08/08/16 0500  Weight: 54.9 kg (121 lb) 57.7 kg (127 lb 3.3 oz)    Exam:   General:  Patient feeling weak and tired; less gargling sound in upper airways appreciated today, even still present. Denies CP. Remains Slightly SOB and requiring 4-5 L of O2 supplementation. Patient is afebrile. Normally uses 2L [place] at home)  Cardiovascular: regular rate, no rubs or gallops, no murmurs appreciated.  Respiratory: mild tachypnea, positive diffuse rhonchi and rattles; no wheezing, no using accessory muscles today. fair air movement.   Abdomen: soft, NT, ND, positive BS  Musculoskeletal: no edema, no cyanosis, no clubbing    Data Reviewed: Basic Metabolic Panel:  Recent Labs Lab 08/06/16 1204 08/07/16 0348 08/08/16 0843 08/09/16 0542  NA 126* 133* 135 137  K 4.0 3.3* 2.7* 4.3  CL 87* 98* 102 104  CO2 '29 28 26 29  '$ GLUCOSE 217* 84 102* 109*  BUN 28* 22* 14 11  CREATININE 0.90 0.64 0.71 0.61  CALCIUM 8.9 8.3* 8.1* 7.9*  MG 2.0  --   --  1.9   Liver Function Tests:  Recent Labs Lab 08/06/16 1204 08/07/16 0348  AST 23 18  ALT 14 11*  ALKPHOS 215* 206*  BILITOT 1.3* 0.8  PROT 6.5 5.7*  ALBUMIN 2.7* 2.1*   CBC:  Recent Labs Lab 08/06/16 1204 08/07/16 0348 08/09/16 0542  WBC 21.9* 13.7* 9.5  NEUTROABS 20.5*  --   --   HGB 12.5 10.6* 10.3*  HCT 38.4 32.7* 32.7*  MCV 86.5 85.2 86.1  PLT 203 178 182   BNP (last 3 results)  Recent Labs  05/09/16 2102 08/06/16 1204  BNP 175.5* 728.4*    CBG:  Recent Labs Lab 08/09/16 1651 08/09/16 2005 08/10/16 0756 08/10/16 1246 08/10/16 1324  GLUCAP 208* 219* 87 62* 73    Recent Results (from the past 240 hour(s))  Blood Culture (routine x 2)     Status: Abnormal    Collection Time: 08/06/16 12:36 PM  Result Value Ref Range Status   Specimen Description BLOOD RIGHT ANTECUBITAL  Final   Special Requests BOTTLES DRAWN AEROBIC AND ANAEROBIC 5CC  Final   Culture  Setup Time   Final    GRAM POSITIVE COCCI IN PAIRS IN BOTH AEROBIC AND ANAEROBIC BOTTLES CRITICAL VALUE NOTED.  VALUE IS CONSISTENT WITH PREVIOUSLY REPORTED AND CALLED VALUE.    Culture (A)  Final    STREPTOCOCCUS PNEUMONIAE SUSCEPTIBILITIES PERFORMED ON PREVIOUS CULTURE WITHIN THE LAST 5 DAYS. Performed at [facility], [place] [address] [place], [place] [address]    Report Status 08/10/2016 FINAL  Final  Blood Culture (routine x 2)     Status: Abnormal   Collection Time: 08/06/16 12:39 PM  Result Value Ref Range Status   Specimen Description BLOOD RIGHT ARM  Final   Special Requests BOTTLES DRAWN AEROBIC AND ANAEROBIC 5CC  Final   Culture  Setup Time   Final    GRAM POSITIVE COCCI IN PAIRS IN BOTH AEROBIC AND ANAEROBIC BOTTLES CRITICAL RESULT CALLED TO, READ BACK BY AND VERIFIED WITH: D. LAFFORD [place], AT 2993 08/07/16  BY Rush Landmark Performed at [facility], [place] [address], [place], [place] [address]    Culture STREPTOCOCCUS PNEUMONIAE (A)  Final   Report Status 08/09/2016 FINAL  Final   Organism ID, Bacteria STREPTOCOCCUS PNEUMONIAE  Final      Susceptibility   Streptococcus pneumoniae - MIC*    ERYTHROMYCIN <=0.12 SENSITIVE Sensitive     LEVOFLOXACIN 1 SENSITIVE Sensitive     PENICILLIN (meningitis) <=0.06 SENSITIVE Sensitive     PENICILLIN (non-meningitis) <=0.06 SENSITIVE Sensitive     CEFTRIAXONE (non-meningitis) <=0.12 SENSITIVE Sensitive     CEFTRIAXONE (meningitis) <=0.12 SENSITIVE Sensitive     * STREPTOCOCCUS PNEUMONIAE  Blood Culture ID Panel (Reflexed)     Status: Abnormal   Collection Time: 08/06/16 12:39 PM  Result Value Ref Range Status   Enterococcus species NOT DETECTED NOT DETECTED Final   Listeria monocytogenes NOT DETECTED NOT DETECTED Final    Staphylococcus species NOT DETECTED NOT DETECTED Final   Staphylococcus aureus NOT DETECTED NOT DETECTED Final   Streptococcus species DETECTED (A) NOT DETECTED Final    Comment: CRITICAL RESULT CALLED TO, READ BACK BY AND VERIFIED WITH: D. LAFFORD [place], AT 5284 08/07/16 BY D. VANHOOK    Streptococcus agalactiae NOT DETECTED NOT DETECTED Final   Streptococcus pneumoniae DETECTED (A) NOT DETECTED Final    Comment: CRITICAL RESULT CALLED TO, READ BACK BY AND VERIFIED WITH: D. LAFFORD PHARMD, AT 1324 08/07/16 BY D. VANHOOK    Streptococcus pyogenes NOT DETECTED NOT DETECTED Final   Acinetobacter baumannii NOT DETECTED NOT DETECTED Final   Enterobacteriaceae species NOT DETECTED NOT DETECTED Final   Enterobacter cloacae complex NOT DETECTED NOT DETECTED Final   Escherichia coli NOT DETECTED NOT DETECTED Final   Klebsiella oxytoca NOT DETECTED NOT DETECTED Final   Klebsiella pneumoniae NOT DETECTED NOT DETECTED Final   Proteus species NOT DETECTED NOT DETECTED Final   Serratia marcescens NOT DETECTED NOT DETECTED Final   Haemophilus influenzae NOT DETECTED NOT DETECTED Final   Neisseria meningitidis NOT DETECTED NOT DETECTED Final   Pseudomonas aeruginosa NOT DETECTED NOT DETECTED Final   Candida albicans NOT DETECTED NOT DETECTED Final   Candida glabrata NOT DETECTED NOT DETECTED Final   Candida krusei NOT DETECTED NOT DETECTED Final   Candida parapsilosis NOT DETECTED NOT DETECTED Final   Candida tropicalis NOT DETECTED NOT DETECTED Final    Comment: Performed at [facility], [address] [place], [place] [address]  MRSA PCR Screening     Status: None   Collection Time: 08/06/16  8:24 PM  Result Value Ref Range Status   MRSA by PCR NEGATIVE NEGATIVE Final    Comment:        The GeneXpert MRSA Assay (FDA approved for NASAL specimens only), is one component of a comprehensive MRSA colonization surveillance program. It is not intended to diagnose MRSA infection nor to guide  or monitor treatment for MRSA infections.   Respiratory Panel by PCR     Status: None   Collection Time: 08/06/16 10:12 PM  Result Value Ref Range Status   Adenovirus NOT DETECTED NOT DETECTED Final   Coronavirus 229E NOT DETECTED NOT DETECTED Final   Coronavirus HKU1 NOT DETECTED NOT DETECTED Final   Coronavirus NL63 NOT DETECTED NOT DETECTED Final   Coronavirus OC43 NOT DETECTED NOT DETECTED Final   Metapneumovirus NOT DETECTED NOT DETECTED Final   Rhinovirus / Enterovirus NOT DETECTED NOT DETECTED Final   Influenza A NOT DETECTED NOT DETECTED Final   Influenza  B NOT DETECTED NOT DETECTED Final   Parainfluenza Virus 1 NOT DETECTED NOT DETECTED Final   Parainfluenza Virus 2 NOT DETECTED NOT DETECTED Final   Parainfluenza Virus 3 NOT DETECTED NOT DETECTED Final   Parainfluenza Virus 4 NOT DETECTED NOT DETECTED Final   Respiratory Syncytial Virus NOT DETECTED NOT DETECTED Final   Bordetella pertussis NOT DETECTED NOT DETECTED Final   Chlamydophila pneumoniae NOT DETECTED NOT DETECTED Final   Mycoplasma pneumoniae NOT DETECTED NOT DETECTED Final    Comment: Performed at [facility], [place] [address] [place], [place] [address]  Culture, sputum-assessment     Status: None   Collection Time: 08/07/16 10:30 PM  Result Value Ref Range Status   Specimen Description SPUTUM  Final   Special Requests NONE  Final   Sputum evaluation THIS SPECIMEN IS ACCEPTABLE FOR SPUTUM CULTURE  Final   Report Status 08/07/2016 FINAL  Final  Culture, respiratory (NON-Expectorated)     Status: None   Collection Time: 08/07/16 10:30 PM  Result Value Ref Range Status   Specimen Description SPUTUM  Final   Special Requests NONE Reflexed from [id number]  Final   Gram Stain   Final    FEW WBC PRESENT, PREDOMINANTLY PMN RARE GRAM POSITIVE COCCI    Culture   Final    Consistent with normal respiratory flora. Performed at [facility], [place] [address], [place], [place] [address]    Report Status  08/10/2016 FINAL  Final     Studies: No results found.  Scheduled Meds: . ampicillin-sulbactam (UNASYN) IV  3 g Intravenous Q6H  . aspirin EC  81 mg Oral Daily  . bisoprolol  5 mg Oral Daily  . buPROPion  300 mg Oral Daily  . clopidogrel  75 mg Oral Daily  . enoxaparin (LOVENOX) injection  40 mg Subcutaneous Q24H  . feeding supplement (ENSURE ENLIVE)  237 mL Oral BID BM  . fluticasone  2 spray Each Nare Daily  . fluticasone furoate-vilanterol  1 puff Inhalation Daily  . guaiFENesin  1,200 mg Oral BID  . insulin aspart  0-9 Units Subcutaneous TID WC  . insulin glargine  8 Units Subcutaneous QHS  . loratadine  10 mg Oral Daily  . multivitamin with minerals  1 tablet Oral Daily  . pantoprazole  40 mg Oral Daily  . polyethylene glycol  17 g Oral BID  . rosuvastatin  20 mg Oral Daily  . sodium chloride flush  3 mL Intravenous Q12H   Continuous Infusions: . sodium chloride 100 mL/hr at 08/10/16 0429    Active Problems:   DM (diabetes mellitus), type 2, uncontrolled ([place])   TIA (transient ischemic attack)-2013   History of CVA (cerebrovascular accident)   Tobacco abuse   Hyperlipidemia with target LDL less than 70   Claudication in peripheral vascular disease ([place])   CAD: Stents in p-mRCA, p & m Cx, p-mLAD   COPD (chronic obstructive pulmonary disease) ([place])   CAP (community acquired pneumonia)   COPD exacerbation ([place])   Alcohol abuse   Dysphagia with aspiration   Depression   Acute on chronic respiratory failure with hypoxemia ([place])   Pneumonia    Time spent: 25 minutes    Barton Dubois  [facility] Pager [phone]. If 7PM-7AM, please contact night-coverage at [email or web address], password [facility] 08/10/2016, 3:51 PM  LOS: 4 days

## 2016-08-11 LAB — GLUCOSE, CAPILLARY
GLUCOSE-CAPILLARY: 148 mg/dL — AB (ref 65–99)
GLUCOSE-CAPILLARY: 163 mg/dL — AB (ref 65–99)
GLUCOSE-CAPILLARY: 83 mg/dL (ref 65–99)
Glucose-Capillary: 47 mg/dL — ABNORMAL LOW (ref 65–99)
Glucose-Capillary: 98 mg/dL (ref 65–99)
Glucose-Capillary: 103 mg/dL — ABNORMAL HIGH (ref 65–99)

## 2016-08-11 MED ORDER — AMOXICILLIN-POT CLAVULANATE 875-125 MG PO TABS
1.0000 | ORAL_TABLET | Freq: Two times a day (BID) | ORAL | Status: DC
  Administered 2016-08-11 – 2016-08-12 (×2): 1 via ORAL
  Filled 2016-08-11 (×2): qty 1

## 2016-08-11 MED ORDER — GLUCOSE 40 % PO GEL
ORAL | Status: AC
  Administered 2016-08-11: 37.5 g
  Filled 2016-08-11: qty 1

## 2016-08-11 MED ORDER — INSULIN GLARGINE 100 UNIT/ML ~~LOC~~ SOLN
5.0000 [IU] | Freq: Every day | SUBCUTANEOUS | Status: DC
  Filled 2016-08-11 (×2): qty 0.05

## 2016-08-11 NOTE — Progress Notes (Signed)
[facility] PROGRESS NOTE  Brenda [name] VQQ:[id number] DOB: [birth year] DOA: 08/06/2016 PCP: [name], MD  Interim summary and HPI 66 y.o.femalesignificant past medical history of severe COPD and recurrent aspiration pneumonia in the setting of known dysphagia on home O2 at night, coronary artery disease status post PCI on plavix , tongue cancer status post radiation therapy with complaints of dysphagia and recurrent aspiration pneumonia; just recently discharge from the hospital due to PNA. Who presented with worsening SOB, increase oxygen requirement, productive cough and general malaise. Found to have sepsis due to PNA again.  Assessment/Plan: 1-Sepsis due to PNA and strep pneumo bacteremia  -patient on admission with temp of 101.2, HR 112, RR 25 and hypoxic on chronic oxygen supplementation. -received IVF's and started on IV antibiotics  -sepsis features resolving/resolved; no longer febrile, WBC's WNL and overall hemodynamically stable. -continue supportive care -has now completed 5 days of IV antibiotics, and will transition to PO augmentin to complete therapy  2-acute on chronic resp failure with hypoxia: patient chronically on 2L [place]; now on 3-4L; on arrival to ED O2 sat was 88% -most likely due to aspiration PNA; but patient recently hospitalized; so covered with broad spectrum agents (cefepime and vancomycin) on admission -low concerns for pseudomonas infection (despite recent hospitalization); also with neg MRSA PCR; so abx's narrowed to unasyn (aiming especially for aspiration). -continue nebulizer therapy  -following SPL rec's and MBS test results, patient started on full liquid diet; high risk fo aspiration and unable to progress further. -continue supportive care and weaned O2 as tolerated -palliative care consultation appreciated. Will arrange for home hospice at discharge; patient DNR/DNI  3-Severe COPD: no wheezing currently -will continue albuterol, Breo and  PRN nebs -continue oxygen supplementation -will continue flutter valve (family will bring from home) and continue mucinex  4-DM type 2: uncontrolled with A1C 9.0 (in dec 2017) -will continue SSI and lantus; last one adjusted further due to hypoglycemic event -continue further adjustment on hypoglycemic regimen as needed  -now that the plan is mainly comfort care; discussed with patient and will aim for once a day shot only at discharge.  5-hx of CAD and tachycardia -neg troponin -continue bisoprolol (dose adjusted for better control) and plavix  6-hx of tongue cancer and chronic dysphagia: -will follow SPL rec's and results of MBS -diet advance to full liquid and planning to advance further due to high risk for aspiration  7-severe protein calorie malnutrition  -nutritional service consulted -but limited intervention given dysphagia and aspiration  -on glucerna TID between meals currently   8-dehydration, hypokalemia, hypomagnesemia and hyponatremia -all electrolytes stable now after repletion and fluid resuscitation. -will mainly focus on non-invasive treatment and more comfort care approach -adjust IVf's rate  Code Status: Full Family Communication: husband at bedside Disposition Plan: has overall remain stable from breathing stand point and has not required BIPAP. Appreciate palliative care consultation. Per discussion, patient now DNR/DNI and will pursuit home hospice. Will transition to PO antibiotic regimen. CM notified to assist with discharge needs. Equipment to be delivered between today/tomorrow prior to discharge.  Consultants:  SPL  Palliative Care  Procedures:  See below for x-ray reports   Antibiotics: unasyn 08/07/16  HPI/Subjective: Patient without fever, comfortable overall. Still with some SOB. Cachetic and chronically ill in appearance. High risk for aspiration and with less appreciated gargling/secretion.  Objective: Vitals:   08/11/16 0400 08/11/16  1300  BP: 135/80 (!) 119/58  Pulse: (!) 59 88  Resp: 18 19  Temp: 98.5 F (  36.9 C) 98 F (36.7 C)    Intake/Output Summary (Last 24 hours) at 08/11/16 1752 Last data filed at 08/11/16 1400  Gross per 24 hour  Intake              530 ml  Output                0 ml  Net              530 ml   Filed Weights   08/06/16 1206 08/08/16 0500  Weight: 54.9 kg (121 lb) 57.7 kg (127 lb 3.3 oz)    Exam:   General:  Patient feeling weak and tired; less gargling sound in upper airways appreciated. Not eating much overall and with hypoglycemic event overnight. continue to use 4L [place]. Denies CP. Patient is afebrile. Normally uses 2L [place] at home)  Cardiovascular: regular rate, no rubs or gallops, no murmurs appreciated.  Respiratory: mild tachypnea, positive diffuse rhonchi and rattles; no wheezing, no using accessory muscles today. fair air movement.   Abdomen: soft, NT, ND, positive BS  Musculoskeletal: no edema, no cyanosis, no clubbing    Data Reviewed: Basic Metabolic Panel:  Recent Labs Lab 08/06/16 1204 08/07/16 0348 08/08/16 0843 08/09/16 0542  NA 126* 133* 135 137  K 4.0 3.3* 2.7* 4.3  CL 87* 98* 102 104  CO2 '29 28 26 29  '$ GLUCOSE 217* 84 102* 109*  BUN 28* 22* 14 11  CREATININE 0.90 0.64 0.71 0.61  CALCIUM 8.9 8.3* 8.1* 7.9*  MG 2.0  --   --  1.9   Liver Function Tests:  Recent Labs Lab 08/06/16 1204 08/07/16 0348  AST 23 18  ALT 14 11*  ALKPHOS 215* 206*  BILITOT 1.3* 0.8  PROT 6.5 5.7*  ALBUMIN 2.7* 2.1*   CBC:  Recent Labs Lab 08/06/16 1204 08/07/16 0348 08/09/16 0542  WBC 21.9* 13.7* 9.5  NEUTROABS 20.5*  --   --   HGB 12.5 10.6* 10.3*  HCT 38.4 32.7* 32.7*  MCV 86.5 85.2 86.1  PLT 203 178 182   BNP (last 3 results)  Recent Labs  05/09/16 2102 08/06/16 1204  BNP 175.5* 728.4*    CBG:  Recent Labs Lab 08/11/16 0752 08/11/16 0841 08/11/16 0917 08/11/16 1205 08/11/16 1703  GLUCAP 47* 98 148* 103* 163*    Recent Results (from  the past 240 hour(s))  Blood Culture (routine x 2)     Status: Abnormal   Collection Time: 08/06/16 12:36 PM  Result Value Ref Range Status   Specimen Description BLOOD RIGHT ANTECUBITAL  Final   Special Requests BOTTLES DRAWN AEROBIC AND ANAEROBIC 5CC  Final   Culture  Setup Time   Final    GRAM POSITIVE COCCI IN PAIRS IN BOTH AEROBIC AND ANAEROBIC BOTTLES CRITICAL VALUE NOTED.  VALUE IS CONSISTENT WITH PREVIOUSLY REPORTED AND CALLED VALUE.    Culture (A)  Final    STREPTOCOCCUS PNEUMONIAE SUSCEPTIBILITIES PERFORMED ON PREVIOUS CULTURE WITHIN THE LAST 5 DAYS. Performed at [facility], [place] [address], [place],  [address]    Report Status 08/10/2016 FINAL  Final  Blood Culture (routine x 2)     Status: Abnormal   Collection Time: 08/06/16 12:39 PM  Result Value Ref Range Status   Specimen Description BLOOD RIGHT ARM  Final   Special Requests BOTTLES DRAWN AEROBIC AND ANAEROBIC 5CC  Final   Culture  Setup Time   Final    GRAM POSITIVE COCCI IN PAIRS IN  BOTH AEROBIC AND ANAEROBIC BOTTLES CRITICAL RESULT CALLED TO, READ BACK BY AND VERIFIED WITH: DReymundo Poll PHARMD, AT 8938 08/07/16 BY Rush [place]mark Performed at [facility], [place] [address] [place] [address] [place], [place] [address]    Culture STREPTOCOCCUS PNEUMONIAE (A)  Final   Report Status 08/09/2016 FINAL  Final   Organism ID, Bacteria STREPTOCOCCUS PNEUMONIAE  Final      Susceptibility   Streptococcus pneumoniae - MIC*    ERYTHROMYCIN <=0.12 SENSITIVE Sensitive     LEVOFLOXACIN 1 SENSITIVE Sensitive     PENICILLIN (meningitis) <=0.06 SENSITIVE Sensitive     PENICILLIN (non-meningitis) <=0.06 SENSITIVE Sensitive     CEFTRIAXONE (non-meningitis) <=0.12 SENSITIVE Sensitive     CEFTRIAXONE (meningitis) <=0.12 SENSITIVE Sensitive     * STREPTOCOCCUS PNEUMONIAE  Blood Culture ID Panel (Reflexed)     Status: Abnormal   Collection Time: 08/06/16 12:39 PM  Result Value Ref Range Status   Enterococcus species NOT DETECTED NOT  DETECTED Final   Listeria monocytogenes NOT DETECTED NOT DETECTED Final   Staphylococcus species NOT DETECTED NOT DETECTED Final   Staphylococcus aureus NOT DETECTED NOT DETECTED Final   Streptococcus species DETECTED (A) NOT DETECTED Final    Comment: CRITICAL RESULT CALLED TO, READ BACK BY AND VERIFIED WITH: D. LAFFORD PHARMD, AT 1025 08/07/16 BY D. VANHOOK    Streptococcus agalactiae NOT DETECTED NOT DETECTED Final   Streptococcus pneumoniae DETECTED (A) NOT DETECTED Final    Comment: CRITICAL RESULT CALLED TO, READ BACK BY AND VERIFIED WITH: D. LAFFORD PHARMD, AT 8527 08/07/16 BY D. VANHOOK    Streptococcus pyogenes NOT DETECTED NOT DETECTED Final   Acinetobacter baumannii NOT DETECTED NOT DETECTED Final   Enterobacteriaceae species NOT DETECTED NOT DETECTED Final   Enterobacter cloacae complex NOT DETECTED NOT DETECTED Final   Escherichia coli NOT DETECTED NOT DETECTED Final   Klebsiella oxytoca NOT DETECTED NOT DETECTED Final   Klebsiella pneumoniae NOT DETECTED NOT DETECTED Final   Proteus species NOT DETECTED NOT DETECTED Final   Serratia marcescens NOT DETECTED NOT DETECTED Final   Haemophilus influenzae NOT DETECTED NOT DETECTED Final   Neisseria meningitidis NOT DETECTED NOT DETECTED Final   Pseudomonas aeruginosa NOT DETECTED NOT DETECTED Final   Candida albicans NOT DETECTED NOT DETECTED Final   Candida glabrata NOT DETECTED NOT DETECTED Final   Candida krusei NOT DETECTED NOT DETECTED Final   Candida parapsilosis NOT DETECTED NOT DETECTED Final   Candida tropicalis NOT DETECTED NOT DETECTED Final    Comment: Performed at [facility], [address] [place], [place] [address]  MRSA PCR Screening     Status: None   Collection Time: 08/06/16  8:24 PM  Result Value Ref Range Status   MRSA by PCR NEGATIVE NEGATIVE Final    Comment:        The GeneXpert MRSA Assay (FDA approved for NASAL specimens only), is one component of a comprehensive MRSA  colonization surveillance program. It is not intended to diagnose MRSA infection nor to guide or monitor treatment for MRSA infections.   Respiratory Panel by PCR     Status: None   Collection Time: 08/06/16 10:12 PM  Result Value Ref Range Status   Adenovirus NOT DETECTED NOT DETECTED Final   Coronavirus 229E NOT DETECTED NOT DETECTED Final   Coronavirus HKU1 NOT DETECTED NOT DETECTED Final   Coronavirus NL63 NOT DETECTED NOT DETECTED Final   Coronavirus OC43 NOT DETECTED NOT DETECTED Final   Metapneumovirus NOT DETECTED NOT DETECTED Final  Rhinovirus / Enterovirus NOT DETECTED NOT DETECTED Final   Influenza A NOT DETECTED NOT DETECTED Final   Influenza B NOT DETECTED NOT DETECTED Final   Parainfluenza Virus 1 NOT DETECTED NOT DETECTED Final   Parainfluenza Virus 2 NOT DETECTED NOT DETECTED Final   Parainfluenza Virus 3 NOT DETECTED NOT DETECTED Final   Parainfluenza Virus 4 NOT DETECTED NOT DETECTED Final   Respiratory Syncytial Virus NOT DETECTED NOT DETECTED Final   Bordetella pertussis NOT DETECTED NOT DETECTED Final   Chlamydophila pneumoniae NOT DETECTED NOT DETECTED Final   Mycoplasma pneumoniae NOT DETECTED NOT DETECTED Final    Comment: Performed at [facility], [place] [address], [place], [place] [address]  Culture, sputum-assessment     Status: None   Collection Time: 08/07/16 10:30 PM  Result Value Ref Range Status   Specimen Description SPUTUM  Final   Special Requests NONE  Final   Sputum evaluation THIS SPECIMEN IS ACCEPTABLE FOR SPUTUM CULTURE  Final   Report Status 08/07/2016 FINAL  Final  Culture, respiratory (NON-Expectorated)     Status: None   Collection Time: 08/07/16 10:30 PM  Result Value Ref Range Status   Specimen Description SPUTUM  Final   Special Requests NONE Reflexed from [id number]  Final   Gram Stain   Final    FEW WBC PRESENT, PREDOMINANTLY PMN RARE GRAM POSITIVE COCCI    Culture   Final    Consistent with normal respiratory  flora. Performed at [facility], [place] [address] [place], [place] [address]    Report Status 08/10/2016 FINAL  Final     Studies: No results found.  Scheduled Meds: . amoxicillin-clavulanate  1 tablet Oral Q12H  . aspirin EC  81 mg Oral Daily  . bisoprolol  5 mg Oral Daily  . buPROPion  300 mg Oral Daily  . clopidogrel  75 mg Oral Daily  . enoxaparin (LOVENOX) injection  40 mg Subcutaneous Q24H  . feeding supplement (ENSURE ENLIVE)  237 mL Oral BID BM  . fluticasone  2 spray Each Nare Daily  . fluticasone furoate-vilanterol  1 puff Inhalation Daily  . guaiFENesin  1,200 mg Oral BID  . insulin aspart  0-9 Units Subcutaneous TID WC  . insulin glargine  5 Units Subcutaneous QHS  . loratadine  10 mg Oral Daily  . multivitamin with minerals  1 tablet Oral Daily  . pantoprazole  40 mg Oral Daily  . polyethylene glycol  17 g Oral BID  . rosuvastatin  20 mg Oral Daily  . sodium chloride flush  3 mL Intravenous Q12H   Continuous Infusions: . sodium chloride 50 mL/hr at 08/10/16 1638    Active Problems:   DM (diabetes mellitus), type 2, uncontrolled ([place])   TIA (transient ischemic attack)-2013   History of CVA (cerebrovascular accident)   Tobacco abuse   Hyperlipidemia with target LDL less than 70   Claudication in peripheral vascular disease ([place])   CAD: Stents in p-mRCA, p & m Cx, p-mLAD   COPD (chronic obstructive pulmonary disease) ([place])   CAP (community acquired pneumonia)   COPD exacerbation ([place])   Alcohol abuse   Dysphagia with aspiration   Depression   Acute on chronic respiratory failure with hypoxemia ([place])   Pneumonia    Time spent: 25 minutes    Barton Dubois  [facility] Pager [phone]. If 7PM-7AM, please contact night-coverage at [email or web address], password [facility] 08/11/2016, 5:52 PM  LOS: 5 days

## 2016-08-11 NOTE — Care Management Important Message (Signed)
Important Message  Patient Details  Name: ANAHI BELMAR MRN: [id number] Date of Birth: 02/23/1951   Medicare Important Message Given:  Yes    Erenest Rasher, RN 08/11/2016, 1:25 PM

## 2016-08-11 NOTE — Progress Notes (Signed)
Writer called to pt's room with CBG 47. Pt alert and oriented. Gave 1/2 tube of glucose gel -- pt states it burns her mouth. Drinking OJ with sugar at this time.

## 2016-08-11 NOTE — Care Management Note (Signed)
Case Management Note  Patient Details  Name: Brenda [name] MRN: [id number] Date of Birth: 1950-06-27  Subjective/Objective:   Severe COPD, sepsis d/t PNA                  Action/Plan: Discharge Planning: NCM spoke to pt and husband, Brenda [name] at bedside. Offered choice for Home Hospice/list provided. Requested [facility]. Contacted [facility] with new referral. Pt has oxygen, neb machine, RW, shower stool, and cane at home. She is requesting hospital bed. Explained Home Hospice Liaison will arrange hospital bed.   PCP [name]   Expected Discharge Date:               Expected Discharge Plan:  Home w Hospice Care  In-House Referral:  NA  Discharge planning Services  CM Consult  Post Acute Care Choice:  Hospice Choice offered to:  Spouse  DME Arranged:  Hospital bed DME Agency:  [place]:  RN [facility] Agency:  [facility]  Status of Service:  In process, will continue to follow  If discussed at Long Length of Stay Meetings, dates discussed:    Additional Comments:  Erenest Rasher, RN 08/11/2016, 1:30 PM

## 2016-08-12 DIAGNOSIS — I25119 Atherosclerotic heart disease of native coronary artery with unspecified angina pectoris: Secondary | ICD-10-CM

## 2016-08-12 DIAGNOSIS — E785 Hyperlipidemia, unspecified: Secondary | ICD-10-CM

## 2016-08-12 LAB — GLUCOSE, CAPILLARY
GLUCOSE-CAPILLARY: 63 mg/dL — AB (ref 65–99)
GLUCOSE-CAPILLARY: 95 mg/dL (ref 65–99)
Glucose-Capillary: 90 mg/dL (ref 65–99)
Glucose-Capillary: 199 mg/dL — ABNORMAL HIGH (ref 65–99)

## 2016-08-12 MED ORDER — BISOPROLOL FUMARATE 5 MG PO TABS: 5.0000 mg | ORAL_TABLET | Freq: Every day | ORAL | 1 refills | Status: AC

## 2016-08-12 MED ORDER — INSULIN GLARGINE 100 UNIT/ML ~~LOC~~ SOLN: 7.0000 [IU] | Freq: Every day | SUBCUTANEOUS | Status: AC

## 2016-08-12 MED ORDER — MORPHINE SULFATE 20 MG/5ML PO SOLN: 5.0000 mg | Freq: Four times a day (QID) | ORAL | 0 refills | Status: AC | PRN

## 2016-08-12 MED ORDER — AMOXICILLIN-POT CLAVULANATE 250-62.5 MG/5ML PO SUSR
875.0000 mg | Freq: Two times a day (BID) | ORAL | 0 refills | Status: AC
Start: 2016-08-12 — End: 2016-08-16

## 2016-08-12 NOTE — Progress Notes (Signed)
No charge note  Patient resting comfortably in bed, does not appear to be dyspneic, does not arouse to gentle voice command BP (!) 141/88 (BP Location: Right Arm)   Pulse 99   Temp 98.5 F (36.9 C) (Oral)   Resp 19   Ht '5\' 5"'$  (1.651 m)   Wt 57.7 kg (127 lb 3.3 oz)   LMP  (LMP Unknown)   SpO2 96%   BMI 21.17 kg/m   severe COPD PNA Dysphagia Recurrent aspiration PLAN: Agree with home with hospice No additional palliative recommendations  Loistine Chance MD [facility] health palliative medicine team [phone]

## 2016-08-12 NOTE — Progress Notes (Signed)
[facility] Liaison:  RN visit.   Notified by Joen Laura, of patient/family request for [facility] services at home after discharge.  Chart and patient information reviewed with Dr. Brigid Re, [place] Director.  Hospice eligibility pending.  Writer spoke with patient at bedside to initiate education to hospice philosophy, services and team approach to care.   Patient deferred me to daughter, [name].  I spoke with Sonya over the phone.  She confirmed interest and voiced understanding of information provided.  Per discussion with Suanne Marker [facility], patient to be discharged today and go home by private vehicle.   DME needs discussed patient currently has the following in the home:  O2 concentrator, shower stool, cane and rolator.  Family request the following DME for delivery to the home today:  Hospital bed 1/2 rails, OBT, standard wheelchair.   [facility] Chartered certified accountant, Gayland Curry, notified and will contact [place] to arrange delivery to the home.  The home address has been verified and is correct in the chart.  Lexa Coronado is the family member to be contacted to arrange time of delivery.  [facility] Referral Center aware of above.  Completed d/c summary will need to be faxed to [facility] at [phone], when final.  Please notify [facility] when patient is ready to leave unit at [place] [phone] after 5pm).  [facility] information and contact numbers have been left in patient's room during visit.  Above information has been shared with [place], [facility].    Please feel free to contact me with any hospice related concerns or questions.  Thank you for the referral.  Edyth Gunnels, RN, [facility] Liaison [phone]  All hospital liaison's are now on [place].

## 2016-08-12 NOTE — Discharge Summary (Signed)
Physician Discharge Summary  Brenda [name] PPJ:[id number] DOB: 08/31/1950 DOA: 08/06/2016  PCP: [name], MD  Admit date: 08/06/2016 Discharge date: 08/12/2016  Time spent: 35 minutes  Recommendations for Outpatient Follow-up:  Full liquid diet only. Adjust medications as per patient swallowing capacity and transition to full comfort care base on progression of symptoms. No further hospitalization     Discharge Diagnoses:  Active Problems:   DM (diabetes mellitus), type 2, uncontrolled ([place])   TIA (transient ischemic attack)-2013   History of CVA (cerebrovascular accident)   Tobacco abuse   Hyperlipidemia with target LDL less than 70   Claudication in peripheral vascular disease ([place])   CAD: Stents in p-mRCA, p & m Cx, p-mLAD   COPD (chronic obstructive pulmonary disease) ([name])   CAP (community acquired pneumonia)   COPD exacerbation ([place])   Alcohol abuse   Dysphagia with aspiration   Depression   Acute on chronic respiratory failure with hypoxemia ([place])   Pneumonia   Discharge Condition: stable and improved. In no acute distress. Patient discharge home with home hospice.   Diet recommendation: full liquid diet  Filed Weights   08/06/16 1206 08/08/16 0500  Weight: 54.9 kg (121 lb) 57.7 kg (127 lb 3.3 oz)    History of present illness:  66 y.o.femalesignificant past medical history of severe COPD and recurrent aspiration pneumonia in the setting of known dysphagiaon home O2 at night, coronary artery disease status post PCI on plavix , tongue cancer status post radiation therapy with complaints of dysphagia and recurrent aspiration pneumonia; just recently discharge from the hospital due to PNA. Who presented with worsening SOB, increase oxygen requirement, productive cough and general malaise. Found to have sepsis due to PNA again.  Hospital Course:  1-Sepsis due to PNA and strep pneumo bacteremia  -patient on admission with temp of 101.2, HR 112, RR 25 and hypoxic  on chronic oxygen supplementation. -received IVF's and started on IV antibiotics  -sepsis features resolving/resolved; no longer febrile, WBC's WNL and overall hemodynamically stable. -continue supportive care -completed 5 days of IV antibiotics, and was transition to PO augmentin to complete therapy on 3/11 (Augmentin at discharge, suspension prescribed)  2-acute on chronic resp failure with hypoxia: patient chronically on 2L [place]; now on 3-4L; on arrival to ED O2 sat was 88% -most likely due to aspiration PNA; but patient recently hospitalized; so covered with broad spectrum agents (cefepime and vancomycin) on admission -low concerns for pseudomonas infection (despite recent hospitalization); also with neg MRSA PCR; so abx's narrowed to unasyn (aiming especially for aspiration). -continue nebulizer therapy  -following SPL rec's and MBS test results, patient started on full liquid diet; high risk fo aspiration and unable to progress further. -continue supportive care and focus on symptomatic management/comfort care mainly. -palliative care consultation appreciated. Will arrange for home hospice at discharge; patient DNR/DNI  3-Severe COPD: no wheezing currently -will continue albuterol, Breo, spiriva and PRN nebs -continue oxygen supplementation (now 4L [place])  4-DM type 2: uncontrolled with A1C 9.0 (in dec 2017) -with multiple hypoglycemic events due to poor ability to tolerate/have PO intake -now that the plan is mainly comfort care; discussed with patient and will aim for once a day shot only at discharge.  5-hx of CAD and tachycardia -neg troponin -continue bisoprolol (dose adjusted for better control) and plavix  6-hx of tongue cancer and chronic dysphagia: -will follow SPL rec's and results of MBS -diet advance to full liquid and planning to advance further due to high risk for  aspiration  7-severe protein calorie malnutrition  -nutritional service consulted -but limited  intervention given dysphagia and aspiration  -glucerna TID between meals recommended if able to tolerate it    8-dehydration, hypokalemia, hypomagnesemia and hyponatremia -all electrolytes stable now after repletion and fluid resuscitation. -will mainly focus on non-invasive treatment and more comfort care approach -patient will follow full liquid diet as tolerated.   Procedures:  See below for x-ray reports   Consultations:  Palliative Care  Discharge Exam: Vitals:   08/12/16 0607 08/12/16 1329  BP: (!) 141/88 116/60  Pulse: 99 89  Resp: 19 20  Temp: 98.5 F (36.9 C) 98.1 F (36.7 C)     General:  Patient feeling weak and tired; less gargling sound in upper airways appreciated. Not eating much overall; no further hypoglycemic events with adjusted insulin regimen. Continue to use 4L . Denies CP. Patient is afebrile. On Full liquid diet.  Cardiovascular: regular rate, no rubs or gallops, no murmurs appreciated.  Respiratory: mild tachypnea, positive scattered diffuse rhonchi and rattles; no wheezing, no using accessory muscles today. improved air movement.   Abdomen: soft, NT, ND, positive BS  Musculoskeletal: no edema, no cyanosis, no clubbing    Discharge Instructions   Discharge Instructions    Discharge instructions    Complete by:  As directed    Maintain adequate hydration as much as possible. Full liquid diet only. Adjust medications as per patient swallowing capacity and transition to full comfort care base on progression of symptoms. No further hospitalization   Patient to be follow at home by hospice services.     Current Discharge Medication List    START taking these medications   Details  morphine 20 MG/5ML solution Take 1.3 mLs (5.2 mg total) by mouth every 6 (six) hours as needed (severe pain). Qty: 20 mL, Refills: 0      CONTINUE these medications which have CHANGED   Details  amoxicillin-clavulanate (AUGMENTIN) 250-62.5 MG/5ML  suspension Take 17.5 mLs (875 mg total) by mouth every 12 (twelve) hours. Qty: 150 mL, Refills: 0    bisoprolol (ZEBETA) 5 MG tablet Take 1 tablet (5 mg total) by mouth daily. Qty: 30 tablet, Refills: 1    insulin glargine (LANTUS) 100 UNIT/ML injection Inject 0.07 mLs (7 Units total) into the skin at bedtime.      CONTINUE these medications which have NOT CHANGED   Details  acetaminophen (TYLENOL) 325 MG tablet Take 2 tablets (650 mg total) by mouth every 6 (six) hours as needed. Qty: 60 tablet, Refills: 0    aspirin EC 81 MG tablet Take 1 tablet (81 mg total) by mouth daily. Qty: 30 tablet, Refills: 10    BREO ELLIPTA 100-25 MCG/INH AEPB INHALE ONE PUFF BY MOUTH ONCE DAILY Qty: 60 each, Refills: 5    buPROPion (WELLBUTRIN XL) 300 MG 24 hr tablet Take 300 mg by mouth daily.     clopidogrel (PLAVIX) 75 MG tablet Take 1 tablet (75 mg total) by mouth daily. Please don't take plavix until you have kyphoplasty. May resume after the procedure, please talk your PCP. Qty: 30 tablet, Refills: 12    ipratropium-albuterol (DUONEB) 0.5-2.5 (3) MG/3ML SOLN Take 3 mLs by nebulization every 6 (six) hours. Qty: 360 mL, Refills: 3    SPIRIVA RESPIMAT 2.5 MCG/ACT AERS INHALE TWO SPRAY(S) BY MOUTH ONCE DAILY Qty: 1 Inhaler, Refills: 5    albuterol (PROVENTIL HFA;VENTOLIN HFA) 108 (90 BASE) MCG/ACT inhaler Inhale 2 puffs into the lungs every 6 (six) hours  as needed for wheezing or shortness of breath. Qty: 1 Inhaler, Refills: 0    cyclobenzaprine (FLEXERIL) 5 MG tablet Take 1-2 tablets (5-10 mg total) by mouth 3 (three) times daily as needed for muscle spasms. Qty: 30 tablet, Refills: 3    feeding supplement, GLUCERNA SHAKE, (GLUCERNA SHAKE) LIQD Take 237 mLs by mouth 2 (two) times daily between meals. Refills: 0    fluticasone (FLONASE) 50 MCG/ACT nasal spray Place 2 sprays into both nostrils daily. Qty: 16 g, Refills: 2    guaiFENesin (MUCINEX) 600 MG 12 hr tablet Take 1,200 mg by mouth 2  (two) times daily.    loratadine (CLARITIN) 10 MG tablet Take 1 tablet (10 mg total) by mouth daily. Qty: 30 tablet, Refills: 0    Maltodextrin-Xanthan Gum (RESOURCE THICKENUP CLEAR) POWD Use with thin liquids to make nectar thickened consistency Qty: 125 g, Refills: 0    omeprazole (PRILOSEC) 20 MG capsule Take 20 mg by mouth daily.     polyethylene glycol powder (GLYCOLAX/MIRALAX) powder Take 17 g by mouth 2 (two) times daily. Qty: 255 g, Refills: 0    rosuvastatin (CRESTOR) 20 MG tablet Take 20 mg by mouth daily.      STOP taking these medications     fish oil-omega-3 fatty acids 1000 MG capsule      ibuprofen (ADVIL,MOTRIN) 200 MG tablet      insulin lispro (HUMALOG) 100 UNIT/ML injection      nitroGLYCERIN (NITROLINGUAL) 0.4 MG/SPRAY spray      oxyCODONE-acetaminophen (PERCOCET/ROXICET) 5-325 MG tablet      traMADol (ULTRAM) 50 MG tablet        Allergies  Allergen Reactions  . Tape Hives and Other (See Comments)    USE PAPER TAPE ONLY- Adhesive peels off skin-makes pt. Raw.  . Isosorbide Other (See Comments)    Drops BP too low  . Nicoderm [Nicotine] Rash    To the PATCH only, breakouts on skin      The results of significant diagnostics from this hospitalization (including imaging, microbiology, ancillary and laboratory) are listed below for reference.    Significant Diagnostic Studies: Ct Angio Chest Pe W And/or Wo Contrast  Result Date: 08/06/2016 CLINICAL DATA:  Increase weakness with chest pain and fever. EXAM: CT ANGIOGRAPHY CHEST WITH CONTRAST TECHNIQUE: Multidetector CT imaging of the chest was performed using the standard protocol during bolus administration of intravenous contrast. Multiplanar CT image reconstructions and MIPs were obtained to evaluate the vascular anatomy. CONTRAST:  100 cc Isovue 370 COMPARISON:  05/09/2016 FINDINGS: Cardiovascular: Satisfactory opacification of the pulmonary arteries to the segmental level. No evidence of pulmonary  embolism. Normal heart size. No pericardial effusion. Mediastinum/Nodes: Mild mediastinal lymphadenopathy. No hilar lymphadenopathy. The esophagus has normal imaging features. There is no axillary lymphadenopathy. Lungs/Pleura: Moderate changes of emphysema. There is right lower lobe consolidation with left lower lobe consolidative changes to a more modest degree. Scattered areas of cavitation within the right lower lobe airspace disease may be related to necrosis small right pleural effusion noted. Upper Abdomen: Unremarkable. Musculoskeletal: Bone windows reveal no worrisome lytic or sclerotic osseous lesions. Patient is status post thoracic vertebral augmentation at 2 levels. Review of the MIP images confirms the above findings. IMPRESSION: 1. No CT evidence for acute pulmonary embolus. 2. Dense right lower lobe airspace disease consistent with pneumonia and scattered areas of potential necrosis. 3. Dependent left lower lobe consolidation. 4. Small right pleural effusion. 5. Moderate changes of emphysema. Electronically Signed   By: Misty Stanley  M.D.   On: 08/06/2016 14:12   Dg Chest Port 1 View  Result Date: 08/06/2016 CLINICAL DATA:  Chest pain, hypoxia, cough, fever. EXAM: PORTABLE CHEST 1 VIEW COMPARISON:  Radiographs of June 17, 2016. FINDINGS: The heart size and mediastinal contours are within normal limits. Atherosclerosis of thoracic aorta is noted. No pneumothorax is noted. No significant pleural effusion is noted. Left lung is clear. Increased right basilar opacity is noted concerning for worsening pneumonia. The visualized skeletal structures are unremarkable. IMPRESSION: Increased right lower lobe opacity is noted concerning for worsening pneumonia. Followup PA and lateral chest X-ray is recommended in 3-4 weeks following trial of antibiotic therapy to ensure resolution and exclude underlying malignancy. Electronically Signed   By: Marijo Conception, M.D.   On: 08/06/2016 12:37   Dg Swallowing  Func-speech Pathology  Result Date: 08/07/2016 Objective Swallowing Evaluation: Type of Study: MBS-Modified Barium Swallow Study Patient Details Name: Brenda [name] MRN: [id number] Date of Birth: February 24, 1951 Today's Date: 08/07/2016 Time: SLP Start Time (ACUTE ONLY): 1431-SLP Stop Time (ACUTE ONLY): 1505 SLP Time Calculation (min) (ACUTE ONLY): 34 min Past Medical History: Past Medical History: Diagnosis Date . Arthritis   "hands" (11/26/2012) . CAD S/P percutaneous coronary angioplasty 1999; 2001;2003, 10/2014  a) BMS- mCx; b) re-do PCI for prog of Dz (NIR BMS 3.5 x 12); c) staged PCI: p-mRCA Express II BMS 3.0x24 & 3.5 x 16 --> p-mLAD Cyper DES 3.0 x 13 & PTCA of D2 (2.5 balloon); 10/2015 PCI p-m LAD Promus DES 2.75 x 38 (covers pre&post-stent ISR), POBA of ostial rPDA . Cancer of base of tongue ([place]) 2010  "& lymph nodes @ right neck" (11/26/2012 . Carotid artery occlusion  . Chronic bronchitis ([place])  . COPD (chronic obstructive pulmonary disease) with emphysema ([place])  . Depression  . GERD (gastroesophageal reflux disease)  . H/O hiatal hernia  . History of blood transfusion  . History of DVT (deep vein thrombosis)  . Hx of radiation therapy  . Hx of radiation therapy 01/16/09 - 03/06/09  base of tongue, right neck node . Hypercholesteremia  . Myocardial infarction 1999, 2003 . PAD (peripheral artery disease) ([name])   status post bilateral femoropopliteal bypass grafting --> Dr. Sherren Mocha Early; -  02/2013 Revision of R Fem-AKPop to BK Pop. . Patent foramen ovale February 2013  Small. Small PFO noted on TTE for TIA/CVA R-L + Bubble study with valsalva . Pneumonia  . Stroke [facility])   left side weakness remains (11/26/2012) . Transient ischemic attack (TIA) February 2013 . Type II diabetes mellitus ([place])  Past Surgical History: Past Surgical History: Procedure Laterality Date . ANKLE FRACTURE SURGERY Right 2007  "got a metal plate and 8 screws in it" (11/26/2012) . APPENDECTOMY  1962 . BYPASS GRAFT POPLITEAL TO POPLITEAL Right  02/05/2013  Procedure: BYPASS GRAFT ABOVE KNEE POPLITEAL TO BELOW KNEE POPLITEAL WITH SMALL SAPHANOUS VEIN;  Surgeon: Rosetta Posner, MD;  Location: [place];  Service: Vascular;  Laterality: Right; . CARDIAC CATHETERIZATION  1999; 10/2005  5/'07: mildLAD 20-30%, patent stent, diags- no Dz. RCA 2 BMS patent ~20% ISR.  Marland Kitchen CARDIAC CATHETERIZATION N/A 10/06/2015  Procedure: Left Heart Cath and Coronary Angiography;  Surgeon: Leonie Man, MD;  Location: [place] CV [facility];  Service: Cardiovascular; p-mLAD 99% pre-stent, 60% ISR & 80% post-stent, o-RPDA 90%, ~10% ISR in 2 RCA (Prox & mid) BMS,~20% ISR in pCx BMS with 5% OM2 BMS.   Marland Kitchen CARDIAC CATHETERIZATION N/A 10/06/2015  Procedure: Coronary Stent Intervention;  Surgeon:  Leonie Man, MD;  Location: [place] [facility];  Service: Cardiovascular; p-m LAD Promus DES 2.75 x 38 (tapered post-dilatoin) . CARDIAC CATHETERIZATION N/A 10/06/2015  Procedure: Coronary Balloon Angioplasty;  Surgeon: Leonie Man, MD;  Location: [place] [facility];  Service: Cardiovascular;  POBA of [place] rPDA. Marland Kitchen CARPAL TUNNEL RELEASE Right 1980's? Marland Kitchen CATARACT EXTRACTION W/ INTRAOCULAR LENS IMPLANT Right 2011 . [place] . CORONARY ANGIOPLASTY WITH STENT PLACEMENT  1999--2005  1999 - OM2 BMS; 2001: p-mCx NIR BMS 3.5 x 12; 10/2001: p-mRCA Express II BMS - 3.5 x 16 - 3.0 x 24.  . ESOPHAGOGASTRODUODENOSCOPY  12/20/2011  Procedure: ESOPHAGOGASTRODUODENOSCOPY (EGD);  Surgeon: Beryle Beams, MD;  Location: [facility];  Service: Endoscopy;  Laterality: N/A;  EGD with balloon dilation . EYE SURGERY   . FEMORAL BYPASS  1999; 2000  Bilateral Fem-AK POP bypass.  . IR GENERIC HISTORICAL  06/10/2016  IR KYPHO EA ADDL LEVEL THORACIC OR LUMBAR 06/10/2016 Sandi Mariscal, MD [facility] . IR GENERIC HISTORICAL  06/10/2016  IR KYPHO THORACIC WITH BONE BIOPSY 06/10/2016 Sandi Mariscal, MD [facility] . IR GENERIC HISTORICAL  06/27/2016  IR RADIOLOGIST EVAL & MGMT 06/27/2016 Sandi Mariscal, MD [facility] . PERIPHERAL  VASCULAR CATHETERIZATION N/A 12/01/2015  Procedure: Abdominal Aortogram w/Lower Extremity;  Surgeon: Elam Dutch, MD;  Location: [place] [facility];  Service: Cardiovascular;  Laterality: N/A; . PERIPHERAL VASCULAR CATHETERIZATION Right 12/22/2015  Procedure: Peripheral Vascular Intervention;  Surgeon: Rosetta Posner, MD;  Location: [place] [facility];  Service: Cardiovascular;  Laterality: Right;  EXternal iliac . SAVORY DILATION  12/20/2011  Procedure: SAVORY DILATION;  Surgeon: Beryle Beams, MD;  Location: [facility];  Service: Endoscopy;  Laterality: N/A; . TEE WITHOUT CARDIOVERSION  07/26/2011  Procedure: TRANSESOPHAGEAL ECHOCARDIOGRAM (TEE);  Surgeon: Pixie Casino, MD;  [facility];  Service: Cardiovascular;  Laterality: N/A; . TONSILLECTOMY  1969 . TRANSTHORACIC ECHOCARDIOGRAM  March 2016  Normal LV size and function EF 55-60%. No regional wall motion and amount is. No significant valvular lesions. . TUBAL LIGATION  1988 . VAGINAL HYSTERECTOMY  1988 HPI: 66 yo female with h/o COPD, GERD, esophageal dysmotility adm with uncontrolled DM2 - found to have pna.  Pt reports dysphagia with liquids causing her to bubble in throat and making her cough/choke.   Pt with esophageal dysmotility per 05/2016 and per [facility] 07/2015 laryngeal penetration of thin and residuals - pt was on dys3/nectar diet at home.  She reports nectar was helpful but can not state how.  Pt current with pna and desires po intake.   Subjective: pt awake in bed Assessment / Plan / Recommendation CHL IP CLINICAL IMPRESSIONS 08/07/2016 Clinical Impression Pt presents with moderate sensorimotor oropharyngeal dysphagia *also has known h/o esophageal dysmotility per esophagram 05/2016.  Consistent mild laryngeal penetration of liquids due to decreased timing and strength of laryngeal closure noted.  No aspiration noted despite pt challenged with sequential swallows of thin.  Despite chin tuck posture pt continued with laryngeal penetration  and moderate vallecular residuals.  Thin vs nectar was not significantly variable in tolerance.  Pt will be chronic aspiration risk due to multifactorial dysphagia and least restrictive diet at this time would be full liquids with strict precautions.  Using teach back, pt educated to findings and compensation reinforcement.  SlP to follow up for dysphagia management.  SLP Visit Diagnosis Dysphagia, oropharyngeal phase (R13.12) Attention and concentration deficit following -- Frontal lobe and executive function deficit following --  Impact on safety and function Risk for inadequate nutrition/hydration;Moderate aspiration risk   CHL IP TREATMENT RECOMMENDATION 08/07/2016 Treatment Recommendations Therapy as outlined in treatment plan below   Prognosis 08/07/2016 Prognosis for Safe Diet Advancement Fair Barriers to Reach Goals Time post onset Barriers/Prognosis Comment -- CHL IP DIET RECOMMENDATION 08/07/2016 SLP Diet Recommendations -- Liquid Administration via Cup;Straw Medication Administration (No Data) Compensations Minimize environmental distractions;Slow rate;Small sips/bites;Multiple dry swallows after each bite/sip;Other (Comment) Postural Changes Remain semi-upright after after feeds/meals (Comment);Seated upright at 90 degrees   CHL IP OTHER RECOMMENDATIONS 08/07/2016 Recommended Consults -- Oral Care Recommendations Oral care BID Other Recommendations --   CHL IP FOLLOW UP RECOMMENDATIONS 08/07/2016 Follow up Recommendations Home health SLP   CHL IP FREQUENCY AND DURATION 08/07/2016 Speech Therapy Frequency (ACUTE ONLY) min 2x/week Treatment Duration 2 weeks      CHL IP ORAL PHASE 08/07/2016 Oral Phase Impaired Oral - Pudding Teaspoon -- Oral - Pudding Cup -- Oral - Honey Teaspoon -- Oral - Honey Cup -- Oral - Nectar Teaspoon -- Oral - Nectar Cup Weak lingual manipulation Oral - Nectar Straw Weak lingual manipulation Oral - Thin Teaspoon Weak lingual manipulation;Reduced posterior propulsion Oral - Thin Cup Weak lingual  manipulation;Reduced posterior propulsion;Delayed oral transit Oral - Thin Straw Weak lingual manipulation;Delayed oral transit;Reduced posterior propulsion Oral - Puree Weak lingual manipulation;Reduced posterior propulsion;Delayed oral transit Oral - Mech Soft Weak lingual manipulation;Delayed oral transit;Reduced posterior propulsion Oral - Regular -- Oral - Multi-Consistency -- Oral - Pill Weak lingual manipulation;Delayed oral transit;Reduced posterior propulsion Oral Phase - Comment --  CHL IP PHARYNGEAL PHASE 08/07/2016 Pharyngeal Phase Impaired Pharyngeal- Pudding Teaspoon -- Pharyngeal -- Pharyngeal- Pudding Cup -- Pharyngeal -- Pharyngeal- Honey Teaspoon -- Pharyngeal -- Pharyngeal- Honey Cup -- Pharyngeal -- Pharyngeal- Nectar Teaspoon Reduced pharyngeal peristalsis;Reduced airway/laryngeal closure;Reduced tongue base retraction;Pharyngeal residue - valleculae Pharyngeal -- Pharyngeal- Nectar Cup Reduced airway/laryngeal closure;Reduced tongue base retraction;Penetration/Aspiration during swallow;Pharyngeal residue - valleculae;Lateral channel residue;Reduced pharyngeal peristalsis;Reduced epiglottic inversion;Reduced anterior laryngeal mobility;Reduced laryngeal elevation Pharyngeal Material enters airway, remains ABOVE vocal cords and not ejected out Pharyngeal- Nectar Straw Reduced pharyngeal peristalsis;Reduced epiglottic inversion;Reduced anterior laryngeal mobility;Reduced laryngeal elevation;Reduced airway/laryngeal closure;Pharyngeal residue - valleculae;Reduced tongue base retraction;Penetration/Aspiration during swallow Pharyngeal Material enters airway, remains ABOVE vocal cords and not ejected out;Material enters airway, CONTACTS cords and not ejected out Pharyngeal- Thin Teaspoon Reduced pharyngeal peristalsis;Reduced epiglottic inversion;Reduced anterior laryngeal mobility;Reduced laryngeal elevation;Reduced airway/laryngeal closure;Reduced tongue base retraction;Pharyngeal residue -  valleculae;Pharyngeal residue - pyriform;Penetration/Aspiration during swallow;Penetration/Aspiration before swallow Pharyngeal Material enters airway, remains ABOVE vocal cords and not ejected out Pharyngeal- Thin Cup Reduced pharyngeal peristalsis;Reduced epiglottic inversion;Reduced anterior laryngeal mobility;Reduced laryngeal elevation;Reduced airway/laryngeal closure;Reduced tongue base retraction;Penetration/Aspiration before swallow;Penetration/Aspiration during swallow Pharyngeal Material enters airway, CONTACTS cords and not ejected out Pharyngeal- Thin Straw Reduced pharyngeal peristalsis;Reduced epiglottic inversion;Reduced anterior laryngeal mobility;Reduced laryngeal elevation;Reduced airway/laryngeal closure;Reduced tongue base retraction;Penetration/Aspiration during swallow;Penetration/Aspiration before swallow Pharyngeal Material enters airway, CONTACTS cords and not ejected out Pharyngeal- Puree Reduced pharyngeal peristalsis;Reduced epiglottic inversion;Reduced anterior laryngeal mobility;Reduced laryngeal elevation;Reduced airway/laryngeal closure;Reduced tongue base retraction;Pharyngeal residue - valleculae Pharyngeal -- Pharyngeal- Mechanical Soft Reduced tongue base retraction;Reduced laryngeal elevation;Reduced anterior laryngeal mobility;Reduced epiglottic inversion;Reduced pharyngeal peristalsis;Pharyngeal residue - valleculae Pharyngeal -- Pharyngeal- Regular -- Pharyngeal -- Pharyngeal- Multi-consistency -- Pharyngeal -- Pharyngeal- Pill Reduced pharyngeal peristalsis;Reduced epiglottic inversion;Reduced anterior laryngeal mobility;Reduced laryngeal elevation;Reduced airway/laryngeal closure;Reduced tongue base retraction;Pharyngeal residue - valleculae Pharyngeal -- Pharyngeal Comment cued effortful swallow helpful to decrease residuals, chin tuck not consistently helpful to decrease or prevent residuals, cued and reflexive cough  weak and did not clear penetrates, cued hock  intermittently effective to clear vallecular residuals, liquid swallows helpful to decrease solid residuals but pt with penetration of liquids, nectar vs thin did not appear with significant variability  CHL IP CERVICAL ESOPHAGEAL PHASE 08/07/2016 Cervical Esophageal Phase Impaired Pudding Teaspoon -- Pudding Cup -- Honey Teaspoon -- Honey Cup -- Nectar Teaspoon -- Nectar Cup -- Nectar Straw -- Thin Teaspoon -- Thin Cup -- Thin Straw -- Puree -- Mechanical Soft -- Regular -- Multi-consistency -- Pill -- Cervical Esophageal Comment pt appeared with delayed clearance of esophagus with backflow of liquid WITHOUT awareness                                                       Claudie Fisherman, [address] [facility] SLP [phone]               Microbiology: Recent Results (from the past 240 hour(s))  Blood Culture (routine x 2)     Status: Abnormal   Collection Time: 08/06/16 12:36 PM  Result Value Ref Range Status   Specimen Description BLOOD RIGHT ANTECUBITAL  Final   Special Requests BOTTLES DRAWN AEROBIC AND ANAEROBIC 5CC  Final   Culture  Setup Time   Final    GRAM POSITIVE COCCI IN PAIRS IN BOTH AEROBIC AND ANAEROBIC BOTTLES CRITICAL VALUE NOTED.  VALUE IS CONSISTENT WITH PREVIOUSLY REPORTED AND CALLED VALUE.    Culture (A)  Final    STREPTOCOCCUS PNEUMONIAE SUSCEPTIBILITIES PERFORMED ON PREVIOUS CULTURE WITHIN THE LAST 5 DAYS. Performed at [facility], [place] [address], [place], [place] [address]    Report Status 08/10/2016 FINAL  Final  Blood Culture (routine x 2)     Status: Abnormal   Collection Time: 08/06/16 12:39 PM  Result Value Ref Range Status   Specimen Description BLOOD RIGHT ARM  Final   Special Requests BOTTLES DRAWN AEROBIC AND ANAEROBIC 5CC  Final   Culture  Setup Time   Final    GRAM POSITIVE COCCI IN PAIRS IN BOTH AEROBIC AND ANAEROBIC BOTTLES CRITICAL RESULT CALLED TO, READ BACK BY AND VERIFIED WITH: DReymundo Poll PHARMD, AT [phone] 08/07/16 BY Rush Landmark Performed at  [facility], [place] [address], [place], [place] [address]    Culture STREPTOCOCCUS PNEUMONIAE (A)  Final   Report Status 08/09/2016 FINAL  Final   Organism ID, Bacteria STREPTOCOCCUS PNEUMONIAE  Final      Susceptibility   Streptococcus pneumoniae - MIC*    ERYTHROMYCIN <=0.12 SENSITIVE Sensitive     LEVOFLOXACIN 1 SENSITIVE Sensitive     PENICILLIN (meningitis) <=0.06 SENSITIVE Sensitive     PENICILLIN (non-meningitis) <=0.06 SENSITIVE Sensitive     CEFTRIAXONE (non-meningitis) <=0.12 SENSITIVE Sensitive     CEFTRIAXONE (meningitis) <=0.12 SENSITIVE Sensitive     * STREPTOCOCCUS PNEUMONIAE  Blood Culture ID Panel (Reflexed)     Status: Abnormal   Collection Time: 08/06/16 12:39 PM  Result Value Ref Range Status   Enterococcus species NOT DETECTED NOT DETECTED Final   Listeria monocytogenes NOT DETECTED NOT DETECTED Final   Staphylococcus species NOT DETECTED NOT DETECTED Final   Staphylococcus aureus NOT DETECTED NOT DETECTED Final   Streptococcus species DETECTED (A) NOT DETECTED Final    Comment: CRITICAL RESULT CALLED TO, READ BACK BY AND VERIFIED WITH: D. LAFFORD  PHARMD, AT [phone] 08/07/16 BY D. VANHOOK    Streptococcus agalactiae NOT DETECTED NOT DETECTED Final   Streptococcus pneumoniae DETECTED (A) NOT DETECTED Final    Comment: CRITICAL RESULT CALLED TO, READ BACK BY AND VERIFIED WITH: D. LAFFORD PHARMD, AT [phone] 08/07/16 BY D. VANHOOK    Streptococcus pyogenes NOT DETECTED NOT DETECTED Final   Acinetobacter baumannii NOT DETECTED NOT DETECTED Final   Enterobacteriaceae species NOT DETECTED NOT DETECTED Final   Enterobacter cloacae complex NOT DETECTED NOT DETECTED Final   Escherichia coli NOT DETECTED NOT DETECTED Final   Klebsiella oxytoca NOT DETECTED NOT DETECTED Final   Klebsiella pneumoniae NOT DETECTED NOT DETECTED Final   Proteus species NOT DETECTED NOT DETECTED Final   Serratia marcescens NOT DETECTED NOT DETECTED Final   Haemophilus influenzae NOT DETECTED NOT  DETECTED Final   Neisseria meningitidis NOT DETECTED NOT DETECTED Final   Pseudomonas aeruginosa NOT DETECTED NOT DETECTED Final   Candida albicans NOT DETECTED NOT DETECTED Final   Candida glabrata NOT DETECTED NOT DETECTED Final   Candida krusei NOT DETECTED NOT DETECTED Final   Candida parapsilosis NOT DETECTED NOT DETECTED Final   Candida tropicalis NOT DETECTED NOT DETECTED Final    Comment: Performed at [facility], [place] [address] [place], [place] [address]  MRSA PCR Screening     Status: None   Collection Time: 08/06/16  8:24 PM  Result Value Ref Range Status   MRSA by PCR NEGATIVE NEGATIVE Final    Comment:        The GeneXpert MRSA Assay (FDA approved for NASAL specimens only), is one component of a comprehensive MRSA colonization surveillance program. It is not intended to diagnose MRSA infection nor to guide or monitor treatment for MRSA infections.   Respiratory Panel by PCR     Status: None   Collection Time: 08/06/16 10:12 PM  Result Value Ref Range Status   Adenovirus NOT DETECTED NOT DETECTED Final   Coronavirus 229E NOT DETECTED NOT DETECTED Final   Coronavirus HKU1 NOT DETECTED NOT DETECTED Final   Coronavirus NL63 NOT DETECTED NOT DETECTED Final   Coronavirus OC43 NOT DETECTED NOT DETECTED Final   Metapneumovirus NOT DETECTED NOT DETECTED Final   Rhinovirus / Enterovirus NOT DETECTED NOT DETECTED Final   Influenza A NOT DETECTED NOT DETECTED Final   Influenza B NOT DETECTED NOT DETECTED Final   Parainfluenza Virus 1 NOT DETECTED NOT DETECTED Final   Parainfluenza Virus 2 NOT DETECTED NOT DETECTED Final   Parainfluenza Virus 3 NOT DETECTED NOT DETECTED Final   Parainfluenza Virus 4 NOT DETECTED NOT DETECTED Final   Respiratory Syncytial Virus NOT DETECTED NOT DETECTED Final   Bordetella pertussis NOT DETECTED NOT DETECTED Final   Chlamydophila pneumoniae NOT DETECTED NOT DETECTED Final   Mycoplasma pneumoniae NOT DETECTED NOT DETECTED Final     Comment: Performed at [facility], [place] [address] [place], [place] [address]  Culture, sputum-assessment     Status: None   Collection Time: 08/07/16 10:30 PM  Result Value Ref Range Status   Specimen Description SPUTUM  Final   Special Requests NONE  Final   Sputum evaluation THIS SPECIMEN IS ACCEPTABLE FOR SPUTUM CULTURE  Final   Report Status 08/07/2016 FINAL  Final  Culture, respiratory (NON-Expectorated)     Status: None   Collection Time: 08/07/16 10:30 PM  Result Value Ref Range Status   Specimen Description SPUTUM  Final   Special Requests NONE Reflexed from [id number]  Final  Gram Stain   Final    FEW WBC PRESENT, PREDOMINANTLY PMN RARE GRAM POSITIVE COCCI    Culture   Final    Consistent with normal respiratory flora. Performed at [facility], [place] [address] [place], [place] [address]    Report Status 08/10/2016 FINAL  Final     Labs: Basic Metabolic Panel:  Recent Labs Lab 08/06/16 1204 08/07/16 0348 08/08/16 0843 08/09/16 0542  NA 126* 133* 135 137  K 4.0 3.3* 2.7* 4.3  CL 87* 98* 102 104  CO2 '29 28 26 29  '$ GLUCOSE 217* 84 102* 109*  BUN 28* 22* 14 11  CREATININE 0.90 0.64 0.71 0.61  CALCIUM 8.9 8.3* 8.1* 7.9*  MG 2.0  --   --  1.9   Liver Function Tests:  Recent Labs Lab 08/06/16 1204 08/07/16 0348  AST 23 18  ALT 14 11*  ALKPHOS 215* 206*  BILITOT 1.3* 0.8  PROT 6.5 5.7*  ALBUMIN 2.7* 2.1*   CBC:  Recent Labs Lab 08/06/16 1204 08/07/16 0348 08/09/16 0542  WBC 21.9* 13.7* 9.5  NEUTROABS 20.5*  --   --   HGB 12.5 10.6* 10.3*  HCT 38.4 32.7* 32.7*  MCV 86.5 85.2 86.1  PLT 203 178 182   BNP (last 3 results)  Recent Labs  05/09/16 2102 08/06/16 1204  BNP 175.5* 728.4*   CBG:  Recent Labs Lab 08/11/16 1703 08/11/16 2130 08/12/16 0604 08/12/16 0709 08/12/16 1206  GLUCAP 163* 83 63* 95 90    Signed:  Barton Dubois MD.  [facility] 08/12/2016, 5:04 PM

## 2016-08-12 NOTE — Progress Notes (Signed)
[place] with [place], [facility], advised that patient equipment will be delivered between 4:00pm and 8:00pm per [facility].  [facility] to be in contact with family directly.  Thank you.  Edyth Gunnels, RN, BSN [facility] Liaison [phone]  All hospital liaison's are now on [place].

## 2016-08-12 NOTE — Progress Notes (Addendum)
Date:  August 12, 2016 Chart reviewed for concurrent status and case management needs. Will continue to follow patient progress.  Home with Hospice today? Discharge Planning: following for needs/Medical necessity form completed and left with unit sect.  RN in charge of patient made aware of this also/tct-amy with hospice-information given/patient has not been dcd at this time 1304. Expected discharge date: [phone] Velva Harman, BSN, [place], [place]

## 2016-08-12 NOTE — Progress Notes (Addendum)
Family informed that equipment had arrived.  Dr. Dyann Kief notified, family agrees with discharge plan.  Patient given discharge instructions, and prescriptions.  Transport set up via EMS.  After transport arrived, patient's husband arrived, and requested to take patient home.  Patient was assisted out by wheelchair.

## 2016-08-15 ENCOUNTER — Encounter: Payer: Self-pay | Admitting: Vascular Surgery

## 2016-08-19 ENCOUNTER — Inpatient Hospital Stay: Admission: RE | Admit: 2016-08-19 | Payer: Medicare Other | Source: Ambulatory Visit

## 2016-08-23 ENCOUNTER — Ambulatory Visit (HOSPITAL_COMMUNITY): Admission: RE | Admit: 2016-08-23 | Payer: Medicare Other | Source: Ambulatory Visit

## 2016-08-27 ENCOUNTER — Encounter (HOSPITAL_COMMUNITY): Payer: Medicare Other

## 2016-08-27 ENCOUNTER — Ambulatory Visit: Payer: Medicare Other | Admitting: Vascular Surgery

## 2016-09-16 ENCOUNTER — Ambulatory Visit (INDEPENDENT_AMBULATORY_CARE_PROVIDER_SITE_OTHER): Admitting: Pulmonary Disease

## 2016-09-16 ENCOUNTER — Encounter: Payer: Self-pay | Admitting: Pulmonary Disease

## 2016-09-16 DIAGNOSIS — J42 Unspecified chronic bronchitis: Secondary | ICD-10-CM | POA: Diagnosis not present

## 2016-09-16 NOTE — Patient Instructions (Signed)
Start taking Breo one puff daily Keep using your oxygen as you are doing Use albuterol as needed for shortness of breath Keep using your oxygen as you are doing

## 2016-09-16 NOTE — Progress Notes (Signed)
Subjective:    Patient ID: Brenda [name], female    DOB: 1950/09/18, 66 y.o.   MRN: [id number]  Synopsis: Severe COPD, still actively smoking as of June 2017, Also with dysphagia and recurrent aspiration leading to recurrent episodes of pneumonia. She also has coronary artery disease and underwent two-vessel coronary intervention in 2017. Simple spirometry FEV1 June 2016 0.67 L (27% predicted) with clear airflow obstruction  HPI Chief Complaint  Patient presents with  . Follow-up    pt c/o sob with exertion, weakness with exertion, prod cough with gray/green mucus.  Pt d/c'ed from hospital on 08/12/16 under hospice care.     Brenda [name] has been at home on hospice since the last visit.  She was hospitalized in 08/2016 and discharged home on 3/12.  Initially she was really weak and she has been getting stronger since then.  She has gradually increased her strength and is now able to help herself trasnfer to a walker.  She can walk some now.  She can toilette on her own now which is a big step forward.    She has smoked some since she came home.    She says that she had a fever one day since coming back.    However in general she feels like her lungs have been "OK".    She has a lot of lower back pain as well as migrating intermittent chest pain.  Walking helps the pain.  Sometimes the pain is unbearable, but this is infrequent.  She has been taking cranberry pills to prevent a UTI.    She feels like she is coughing up more. She says that the most helpful therapy she has for her breathing is her oxygen.  She is using breathing treatments as needed.    She has seen some green color in her mucus.  She uses morphine occasionally, but only at the end of the day for the day.     Past Medical History:  Diagnosis Date  . Arthritis    "hands" (11/26/2012)  . CAD S/P percutaneous coronary angioplasty 1999; 2001;2003, 10/2014   a) BMS- mCx; b) re-do PCI for prog of Dz (NIR BMS 3.5 x 12); c) staged  PCI: p-mRCA Express II BMS 3.0x24 & 3.5 x 16 --> p-mLAD Cyper DES 3.0 x 13 & PTCA of D2 (2.5 balloon); 10/2015 PCI p-m LAD Promus DES 2.75 x 38 (covers pre&post-stent ISR), POBA of ostial rPDA  . Cancer of base of tongue ([place]) 2010   "& lymph nodes @ right neck" (11/26/2012  . Carotid artery occlusion   . Chronic bronchitis ([place])   . COPD (chronic obstructive pulmonary disease) with emphysema ([place])   . Depression   . GERD (gastroesophageal reflux disease)   . H/O hiatal hernia   . History of blood transfusion   . History of DVT (deep vein thrombosis)   . Hx of radiation therapy   . Hx of radiation therapy 01/16/09 - 03/06/09   base of tongue, right neck node  . Hypercholesteremia   . Myocardial infarction [facility]) 1999, 2003  . PAD (peripheral artery disease) ([name])    status post bilateral femoropopliteal bypass grafting --> Dr. Sherren Mocha Early; -  02/2013 Revision of R Fem-AKPop to BK Pop.  . Patent foramen ovale February 2013   Small. Small PFO noted on TTE for TIA/CVA R-L + Bubble study with valsalva  . Pneumonia   . Stroke [facility])    left side weakness remains (11/26/2012)  . Transient  ischemic attack (TIA) February 2013  . Type II diabetes mellitus ([place])       Review of Systems  Constitutional: Positive for chills. Negative for fatigue and fever.  HENT: Negative for postnasal drip, rhinorrhea and sinus pressure.   Respiratory: Positive for cough, shortness of breath and wheezing.   Cardiovascular: Negative for chest pain, palpitations and leg swelling.       Objective:   Physical Exam Vitals:   09/16/16 1148  BP: (!) 142/68  Pulse: 82  SpO2: 96%  Weight: 51.7 kg (114 lb)  Height: '5\' 5"'$  (1.651 m)   3 L [place]  Gen: well appearing HENT: OP clear, TM's clear, neck supple PULM: CTA B, normal percussion CV: RRR, no mgr, trace edema GI: BS+, soft, nontender Derm: no cyanosis or rash Psyche: normal mood and affect  CBC    Component Value Date/Time   WBC 9.5 08/09/2016 0542    RBC 3.80 (L) 08/09/2016 0542   HGB 10.3 (L) 08/09/2016 0542   HGB 15.6 08/21/2012 1256   HCT 32.7 (L) 08/09/2016 0542   HCT 44.6 08/21/2012 1256   PLT 182 08/09/2016 0542   PLT 192 08/21/2012 1256   MCV 86.1 08/09/2016 0542   MCV 92.0 08/21/2012 1256   MCH 27.1 08/09/2016 0542   MCHC 31.5 08/09/2016 0542   RDW 14.7 08/09/2016 0542   RDW 13.8 08/21/2012 1256   LYMPHSABS 0.7 08/06/2016 1204   LYMPHSABS 0.6 (L) 08/21/2012 1256   MONOABS 0.7 08/06/2016 1204   MONOABS 0.3 08/21/2012 1256   EOSABS 0.0 08/06/2016 1204   EOSABS 0.0 08/21/2012 1256   BASOSABS 0.0 08/06/2016 1204   BASOSABS 0.1 08/21/2012 1256     March 2018 hospitalization records reviewed were she had acute restroom failure with hypoxemia and was discharged on home hospice.  Assessment & Plan:   COPD (chronic obstructive pulmonary disease) ([place]) She has end stage COPD and is now on hopsice.  I agree with this plan considering all the hospitalization she's had in the last 2 years.  That said, she is having increasing cough with chest congestion and I think this may be related to stopping the inhaled long-acting beta agonist and inhaled corticosteroid.  Also, because of the severity of her COPD and dyspnea I advised that it's okay to use morphine as needed for shortness of breath.  Plan: Resume Breo 1 puff daily Keep using albuterol as needed Keep using oxygen Use morphine as needed for dyspnea F/U 2 months  Greater than 50% of this 28 minute visit spent face-to-face    Current Outpatient Prescriptions:  .  acetaminophen (TYLENOL) 325 MG tablet, Take 2 tablets (650 mg total) by mouth every 6 (six) hours as needed. (Patient taking differently: Take 650 mg by mouth every 6 (six) hours as needed for mild pain. ), Disp: 60 tablet, Rfl: 0 .  aspirin EC 81 MG tablet, Take 1 tablet (81 mg total) by mouth daily., Disp: 30 tablet, Rfl: 10 .  bisoprolol (ZEBETA) 5 MG tablet, Take 1 tablet (5 mg total) by mouth daily.,  Disp: 30 tablet, Rfl: 1 .  BREO ELLIPTA 100-25 MCG/INH AEPB, INHALE ONE PUFF BY MOUTH ONCE DAILY, Disp: 60 each, Rfl: 5 .  clopidogrel (PLAVIX) 75 MG tablet, Take 1 tablet (75 mg total) by mouth daily. Please don't take plavix until you have kyphoplasty. May resume after the procedure, please talk your PCP., Disp: 30 tablet, Rfl: 12 .  feeding supplement, GLUCERNA SHAKE, (GLUCERNA SHAKE) LIQD, Take 237 mLs  by mouth 2 (two) times daily between meals., Disp: , Rfl: 0 .  fluticasone (FLONASE) 50 MCG/ACT nasal spray, Place 2 sprays into both nostrils daily., Disp: 16 g, Rfl: 2 .  guaiFENesin (MUCINEX) 600 MG 12 hr tablet, Take 1,200 mg by mouth 2 (two) times daily., Disp: , Rfl:  .  insulin glargine (LANTUS) 100 UNIT/ML injection, Inject 0.07 mLs (7 Units total) into the skin at bedtime., Disp: , Rfl:  .  loratadine (CLARITIN) 10 MG tablet, Take 1 tablet (10 mg total) by mouth daily., Disp: 30 tablet, Rfl: 0 .  morphine 20 MG/5ML solution, Take 1.3 mLs (5.2 mg total) by mouth every 6 (six) hours as needed (severe pain)., Disp: 20 mL, Rfl: 0 .  omeprazole (PRILOSEC) 20 MG capsule, Take 20 mg by mouth daily. , Disp: , Rfl:  .  polyethylene glycol powder (GLYCOLAX/MIRALAX) powder, Take 17 g by mouth 2 (two) times daily. (Patient taking differently: Take 17 g by mouth 2 (two) times daily as needed for moderate constipation. ), Disp: 255 g, Rfl: 0

## 2016-09-16 NOTE — Assessment & Plan Note (Signed)
She has end stage COPD and is now on hopsice.  I agree with this plan considering all the hospitalization she's had in the last 2 years.  That said, she is having increasing cough with chest congestion and I think this may be related to stopping the inhaled long-acting beta agonist and inhaled corticosteroid.  Also, because of the severity of her COPD and dyspnea I advised that it's okay to use morphine as needed for shortness of breath.  Plan: Resume Breo 1 puff daily Keep using albuterol as needed Keep using oxygen Use morphine as needed for dyspnea F/U 2 months  Greater than 50% of this 28 minute visit spent face-to-face

## 2016-10-01 DEATH — deceased

## 2016-10-08 ENCOUNTER — Other Ambulatory Visit: Payer: Self-pay | Admitting: *Deleted

## 2016-10-08 ENCOUNTER — Telehealth: Payer: Self-pay | Admitting: *Deleted

## 2016-10-08 DIAGNOSIS — I2510 Atherosclerotic heart disease of native coronary artery without angina pectoris: Secondary | ICD-10-CM

## 2016-10-08 DIAGNOSIS — Z9861 Coronary angioplasty status: Secondary | ICD-10-CM

## 2016-10-08 DIAGNOSIS — E785 Hyperlipidemia, unspecified: Secondary | ICD-10-CM

## 2016-10-08 DIAGNOSIS — I739 Peripheral vascular disease, unspecified: Secondary | ICD-10-CM

## 2016-10-08 DIAGNOSIS — I25119 Atherosclerotic heart disease of native coronary artery with unspecified angina pectoris: Secondary | ICD-10-CM

## 2016-10-08 DIAGNOSIS — E440 Moderate protein-calorie malnutrition: Secondary | ICD-10-CM

## 2016-10-08 DIAGNOSIS — Z720 Tobacco use: Secondary | ICD-10-CM

## 2016-10-08 DIAGNOSIS — K5909 Other constipation: Secondary | ICD-10-CM

## 2016-10-08 NOTE — Telephone Encounter (Signed)
Mailed letter and labslip 

## 2016-10-08 NOTE — Telephone Encounter (Signed)
-----   Message from Raiford Simmonds, RN sent at 05/06/2016  2:23 PM EST ----- LABS June 2018 CMP.LIPID  MAIL IN MAY 2018

## 2016-11-04 ENCOUNTER — Ambulatory Visit: Payer: Medicare Other | Admitting: Cardiology

## 2016-11-20 ENCOUNTER — Ambulatory Visit: Payer: Medicare Other | Admitting: Pulmonary Disease

## 2018-06-22 IMAGING — RF DG ESOPHAGUS
14 of 15 series · 14 of 15 positions shown · non-contrast
Comparison: None.

CLINICAL DATA: Esophageal stricture. Worsening dysphasia.
Projectile vomiting.

EXAM:
ESOPHOGRAM/BARIUM SWALLOW
TECHNIQUE: Single contrast examination was performed using thick and thin
barium.
FLUOROSCOPY TIME:  Fluoroscopy Time:  2 minutes 12 seconds
Radiation Exposure Index (if provided by the fluoroscopic device):
Number of Acquired Spot Images: 0

[Series 1: cp_standard · 0.17mm/px · 1 of 1 slices shown (1 of 14)]
[im 1/1]
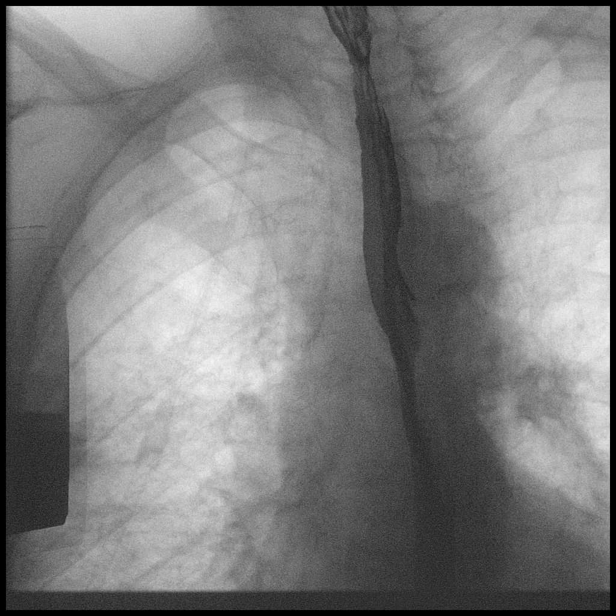

[Series 3: cp_standard · 0.17mm/px · 1 of 1 slices shown (2 of 14)]
[im 1/1]
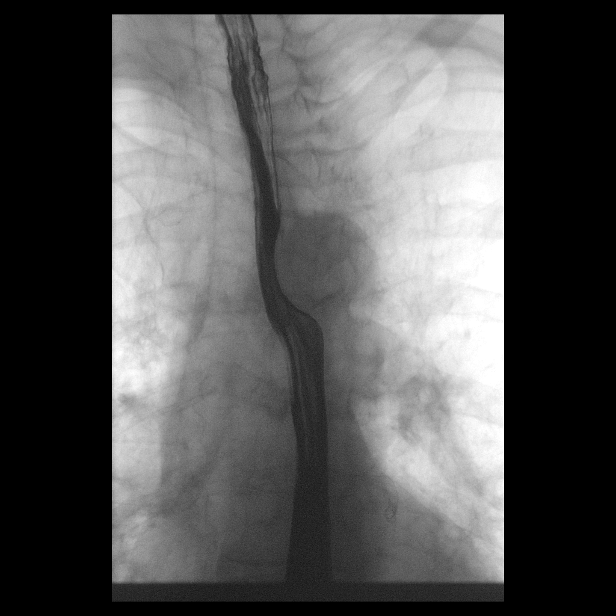

[Series 4: cp_standard · 0.17mm/px · 1 of 1 slices shown (3 of 14)]
[im 1/1]
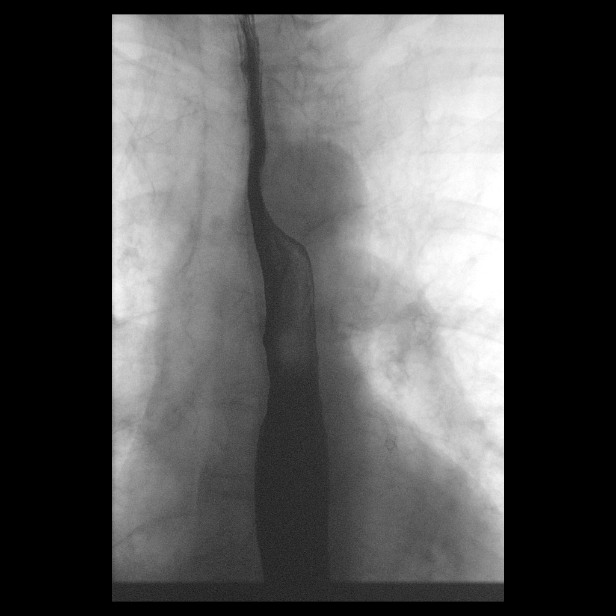

[Series 5: cp_standard · 0.17mm/px · 1 of 1 slices shown (4 of 14)]
[im 1/1]
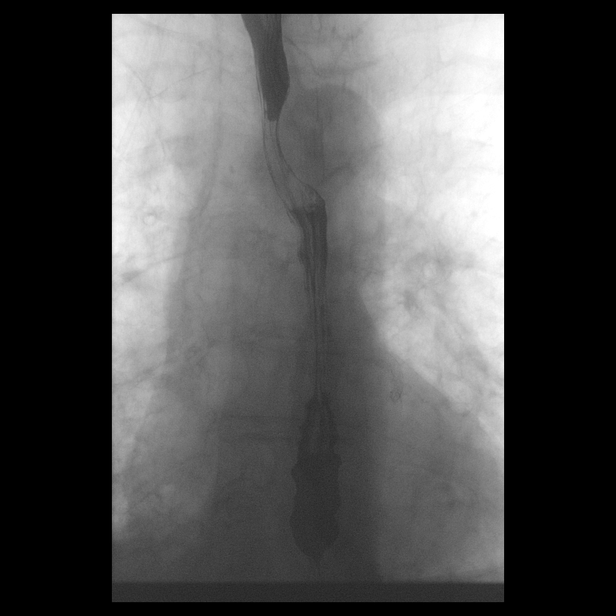

[Series 6: cp_standard · 0.17mm/px · 1 of 1 slices shown (5 of 14)]
[im 1/1]
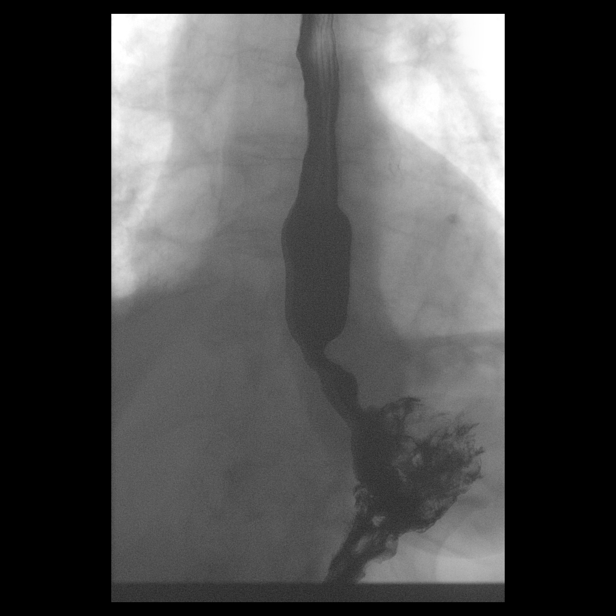

[Series 7: cp_standard · 0.17mm/px · 1 of 1 slices shown (6 of 14)]
[im 1/1]
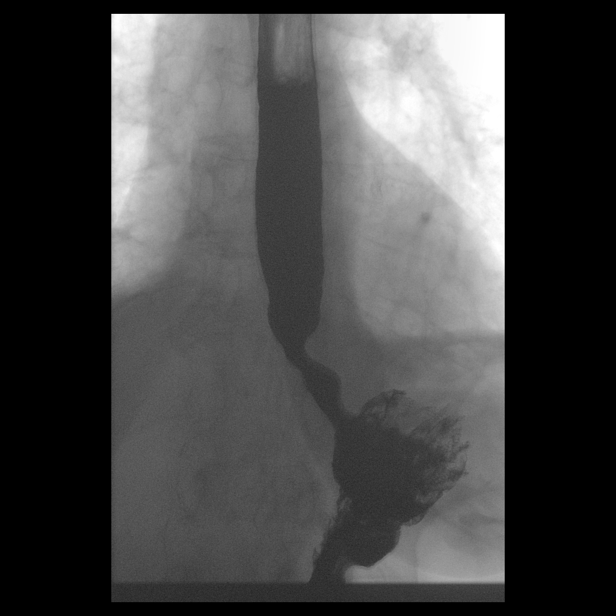

[Series 8: cp_standard · 0.17mm/px · 1 of 1 slices shown (7 of 14)]
[im 1/1]
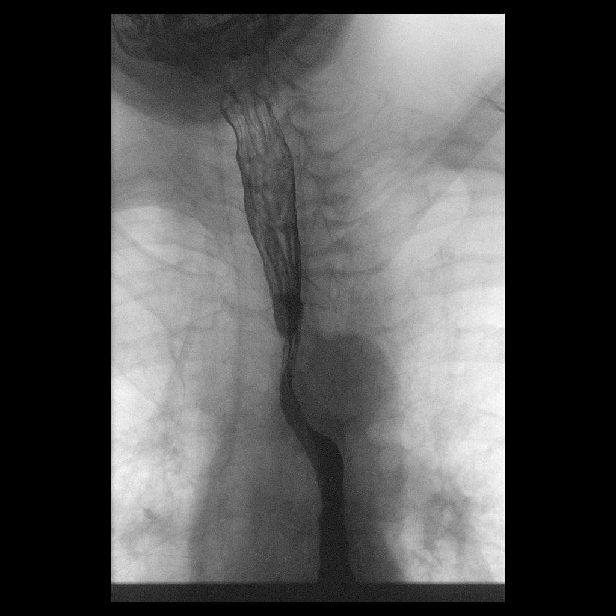

[Series 10: cp_standard · 0.18mm/px · 1 of 1 slices shown (8 of 14)]
[im 1/1]
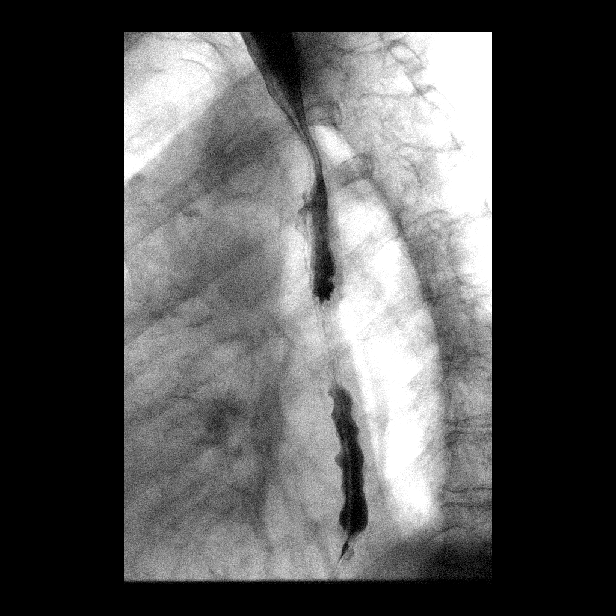

[Series 11: cp_standard · 0.18mm/px · 1 of 1 slices shown (9 of 14)]
[im 1/1]
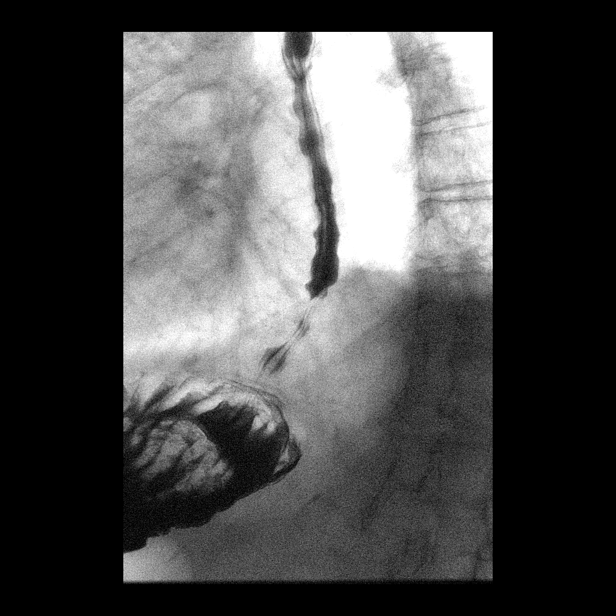

[Series 12: cp_standard · 0.18mm/px · 1 of 1 slices shown (10 of 14)]
[im 1/1]
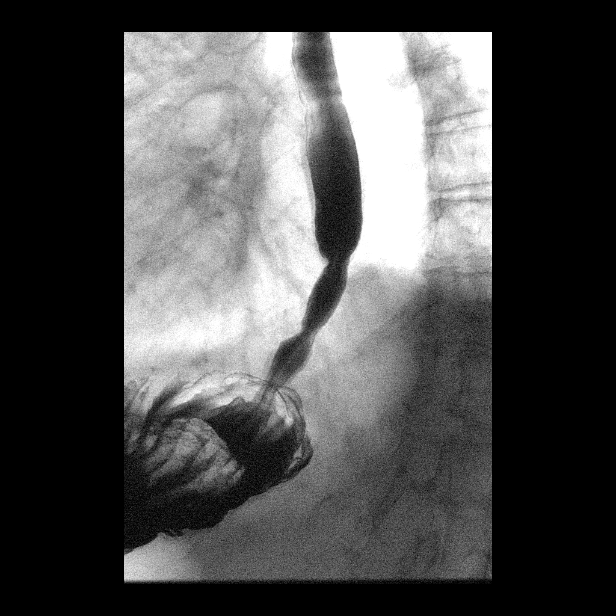

[Series 13: cp_standard · 0.18mm/px · 1 of 1 slices shown (11 of 14)]
[im 1/1]
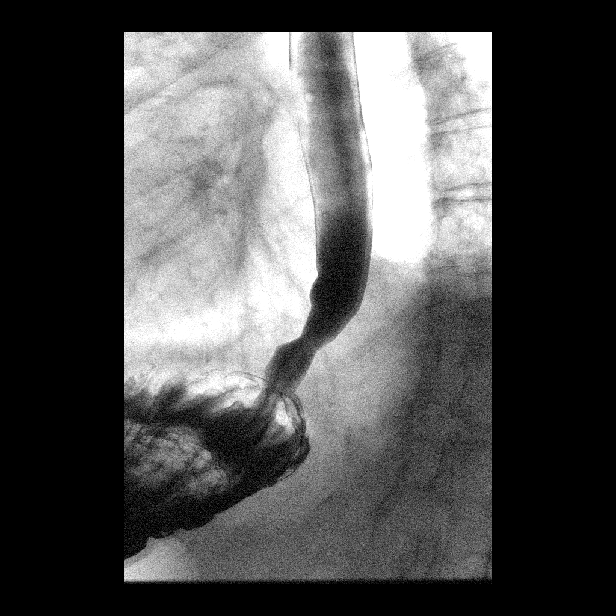

[Series 14: cp_standard · 0.18mm/px · 1 of 1 slices shown (12 of 14)]
[im 1/1]
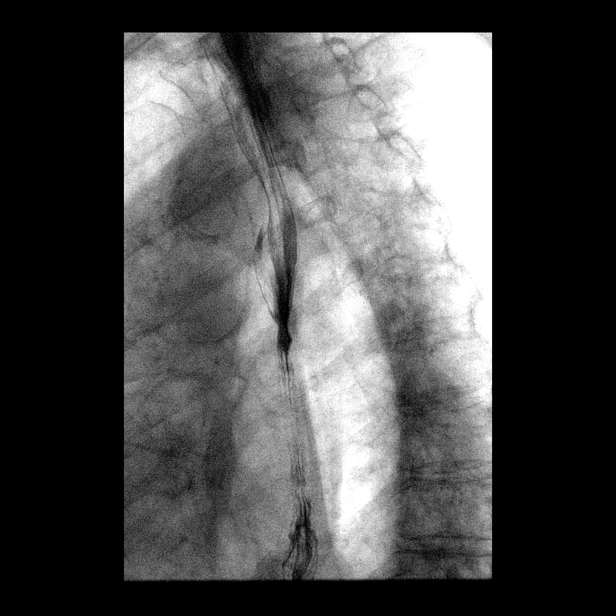

[Series 15: cp_standard · 0.18mm/px · 1 of 1 slices shown (13 of 14)]
[im 1/1]
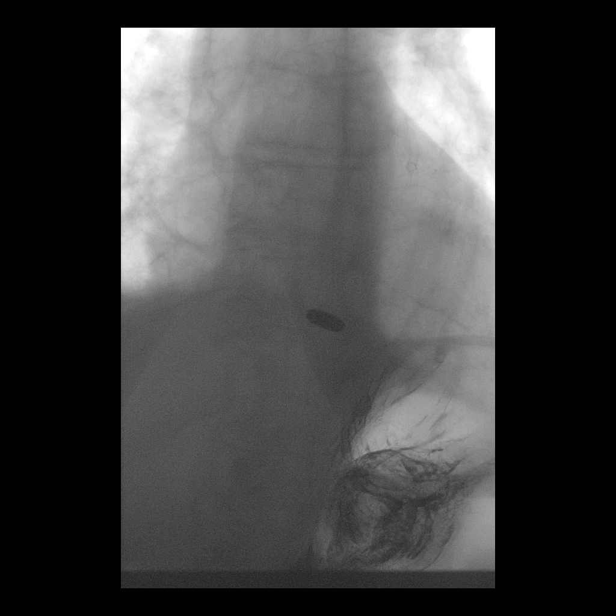

[Series 16: cp_standard · 0.18mm/px · 1 of 1 slices shown (14 of 14)]
[im 1/1]
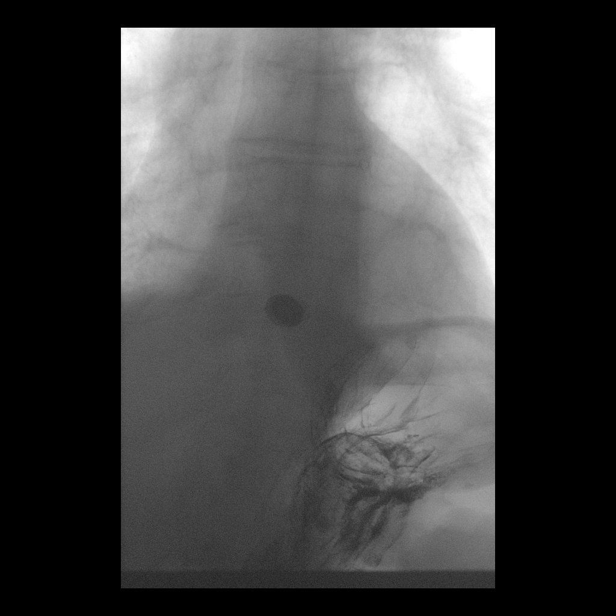

[14 of 15 positions shown; findings below may reference images not displayed]

FINDINGS: Double contrast evaluation esophagus is limited, as the patient was
felt a likely experience difficulty swallowing the effervescent
crystals. No gross mucosal abnormality identified. Evaluation of
peristalsis demonstrates incomplete primary peristaltic wave with
significant contrast stasis throughout the thoracic esophagus. In
the RAO orientation single contrast evaluation demonstrates no
persistent narrowing or stricture. A 13 mm barium tablet has delayed
passage in the mid and lower esophagus. This delayed persistent even
with subsequent barium swallows.
IMPRESSION: 1. Degraded exam secondary to patient immobility and difficulty with
multiple swallows.
2. Moderate esophageal dysmotility, likely presbyesophagus.
3. No definite evidence for esophageal mass or stricture.
4. Delayed passage of a 13 mm barium tablet in the mid and lower
esophagus, favor to be related to dysmotility.
